# Patient Record
Sex: Female | Born: 1940 | Race: White | Hispanic: No | Smoking: Never smoker
Health system: Southern US, Community
[De-identification: ages and names within clinical notes are randomized; demographics above are authoritative.]

## PROBLEM LIST (undated history)

## (undated) DIAGNOSIS — I509 Heart failure, unspecified: Secondary | ICD-10-CM

## (undated) DIAGNOSIS — J45909 Unspecified asthma, uncomplicated: Secondary | ICD-10-CM

## (undated) DIAGNOSIS — E119 Type 2 diabetes mellitus without complications: Secondary | ICD-10-CM

## (undated) DIAGNOSIS — I10 Essential (primary) hypertension: Secondary | ICD-10-CM

## (undated) DIAGNOSIS — I2510 Atherosclerotic heart disease of native coronary artery without angina pectoris: Secondary | ICD-10-CM

## (undated) DIAGNOSIS — E785 Hyperlipidemia, unspecified: Secondary | ICD-10-CM

## (undated) DIAGNOSIS — F419 Anxiety disorder, unspecified: Secondary | ICD-10-CM

## (undated) DIAGNOSIS — I4891 Unspecified atrial fibrillation: Secondary | ICD-10-CM

## (undated) HISTORY — PX: NO PAST SURGERIES: SHX2092

## (undated) HISTORY — PX: EYE SURGERY: SHX253

## (undated) HISTORY — PX: CORONARY ARTERY BYPASS GRAFT: SHX141

---

## 2003-12-28 ENCOUNTER — Other Ambulatory Visit: Payer: Self-pay

## 2003-12-28 ENCOUNTER — Emergency Department: Payer: Self-pay | Admitting: Emergency Medicine

## 2004-02-01 ENCOUNTER — Other Ambulatory Visit: Payer: Self-pay

## 2004-02-01 ENCOUNTER — Emergency Department: Payer: Self-pay | Admitting: Emergency Medicine

## 2004-05-11 ENCOUNTER — Emergency Department: Payer: Self-pay | Admitting: Unknown Physician Specialty

## 2004-05-11 ENCOUNTER — Other Ambulatory Visit: Payer: Self-pay

## 2004-07-19 ENCOUNTER — Other Ambulatory Visit: Payer: Self-pay

## 2004-07-19 ENCOUNTER — Emergency Department: Payer: Self-pay | Admitting: Emergency Medicine

## 2004-08-11 ENCOUNTER — Other Ambulatory Visit: Payer: Self-pay

## 2004-08-11 ENCOUNTER — Inpatient Hospital Stay: Payer: Self-pay | Admitting: Internal Medicine

## 2004-08-12 ENCOUNTER — Other Ambulatory Visit: Payer: Self-pay

## 2004-08-13 ENCOUNTER — Other Ambulatory Visit: Payer: Self-pay

## 2005-01-20 ENCOUNTER — Emergency Department: Payer: Self-pay | Admitting: Emergency Medicine

## 2005-02-12 ENCOUNTER — Emergency Department: Payer: Self-pay | Admitting: Emergency Medicine

## 2005-02-23 ENCOUNTER — Emergency Department: Payer: Self-pay | Admitting: Emergency Medicine

## 2005-12-26 ENCOUNTER — Emergency Department: Payer: Self-pay | Admitting: Emergency Medicine

## 2006-01-04 ENCOUNTER — Ambulatory Visit: Payer: Self-pay | Admitting: Family Medicine

## 2006-01-20 ENCOUNTER — Other Ambulatory Visit: Payer: Self-pay

## 2006-01-20 ENCOUNTER — Emergency Department: Payer: Self-pay | Admitting: Emergency Medicine

## 2006-03-02 ENCOUNTER — Emergency Department: Payer: Self-pay | Admitting: Emergency Medicine

## 2006-03-02 ENCOUNTER — Other Ambulatory Visit: Payer: Self-pay

## 2006-03-16 ENCOUNTER — Emergency Department: Payer: Self-pay | Admitting: Emergency Medicine

## 2006-04-15 IMAGING — CT CT HEAD WITHOUT CONTRAST
2 series · 16 of 30 positions shown, 20 images · non-contrast
Comparison: none

REASON FOR EXAM: evaluate for bleeding rm12
COMMENTS:

[Series 2: without · axial · non-contrast · 0.38mm/px · z∈[+262,+382]mm · 13 of 29 slices shown, 17 images]
[im 3/29  brain]
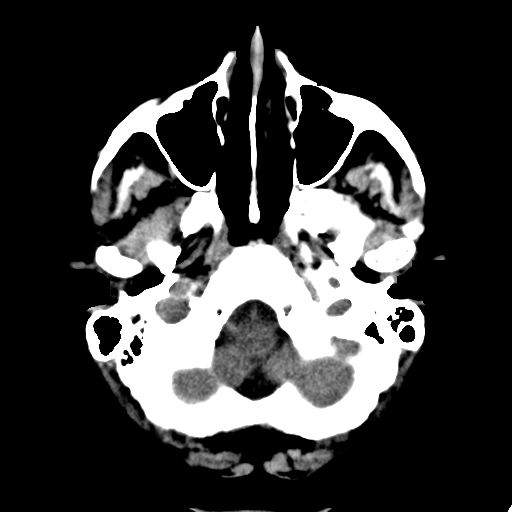
[im 3/29  bone]
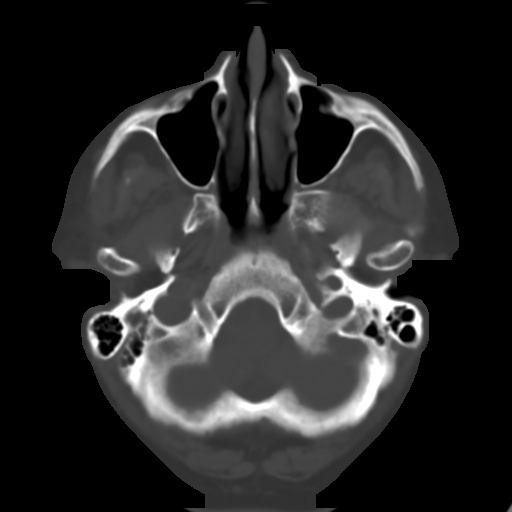
[im 5/29  brain]
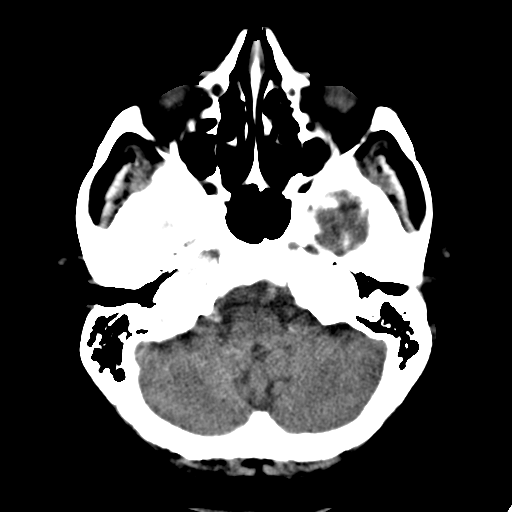
[im 7/29  brain]
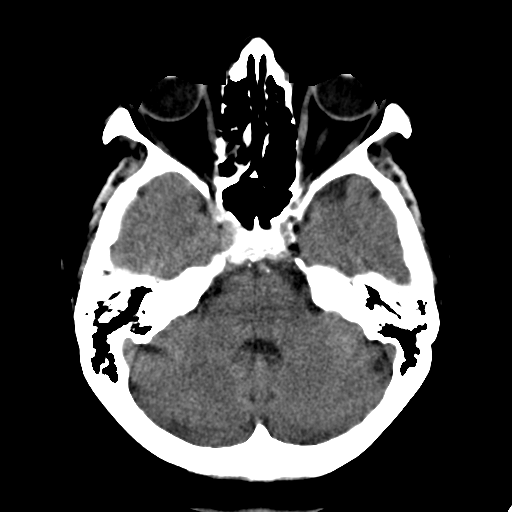
[im 9/29  brain]
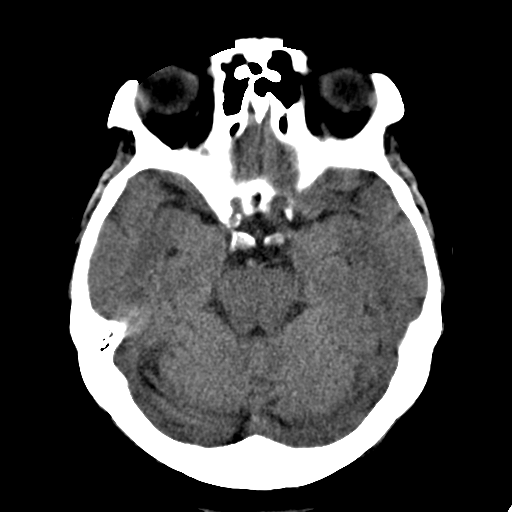
[im 11/29  brain]
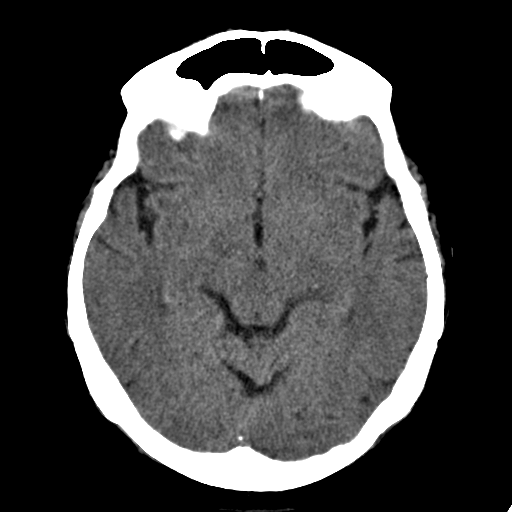
[im 11/29  bone]
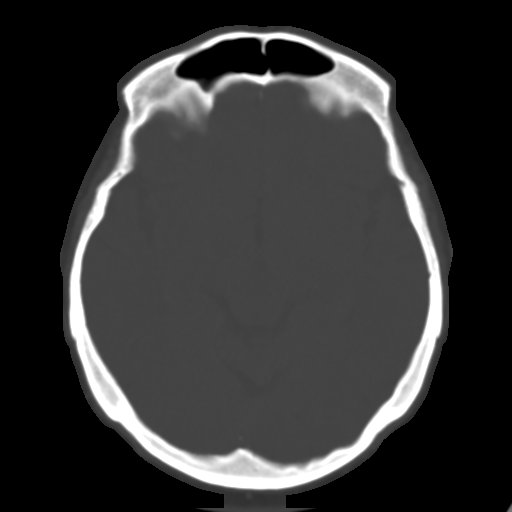
[im 13/29  brain]
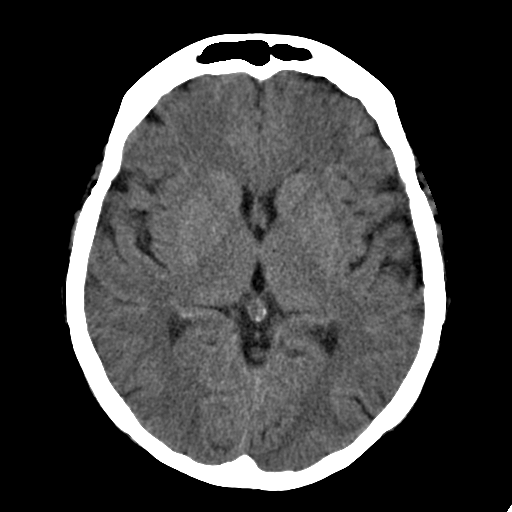
[im 15/29  brain]
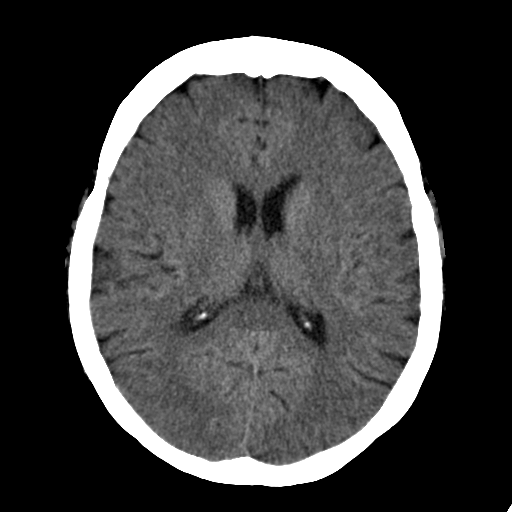
[im 17/29  brain]
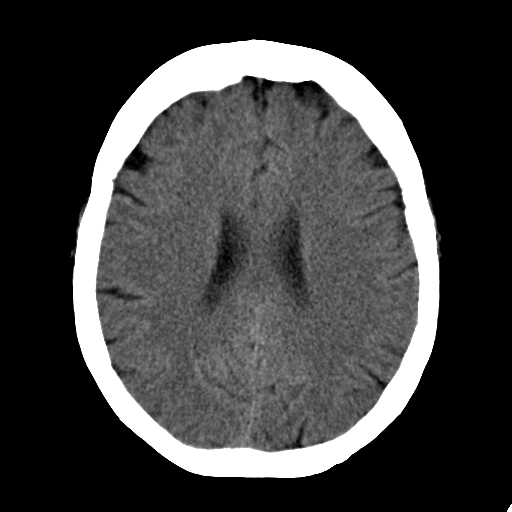
[im 19/29  brain]
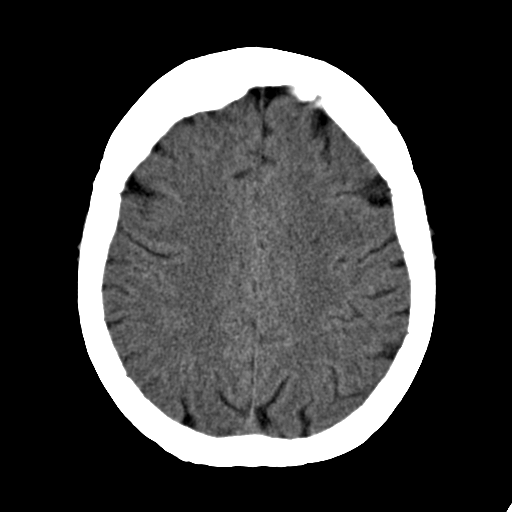
[im 19/29  bone]
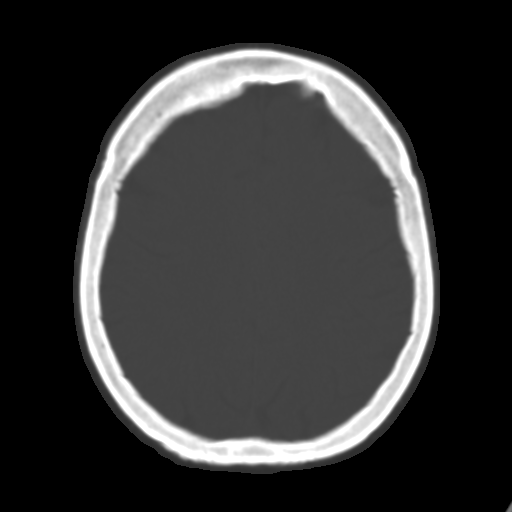
[im 21/29  brain]
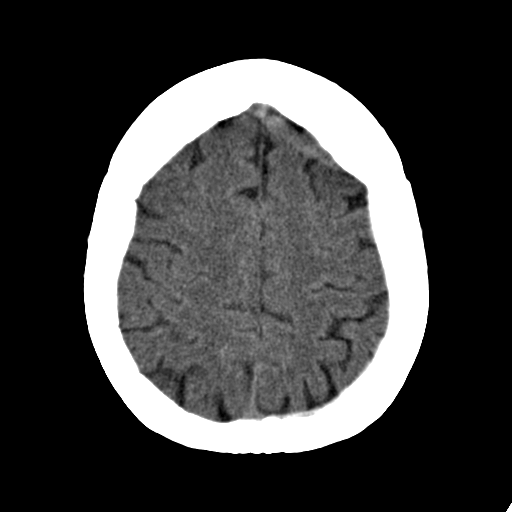
[im 23/29  brain]
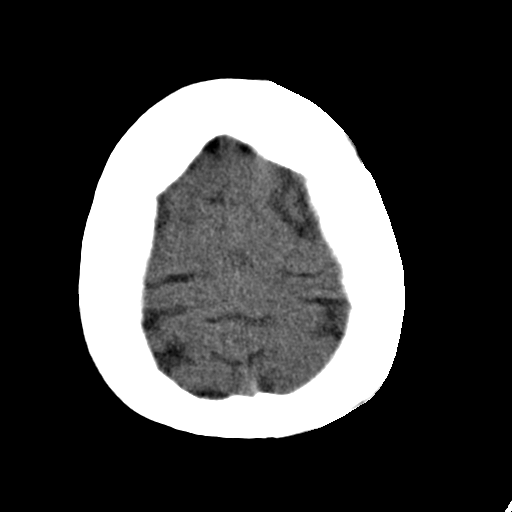
[im 25/29  brain]
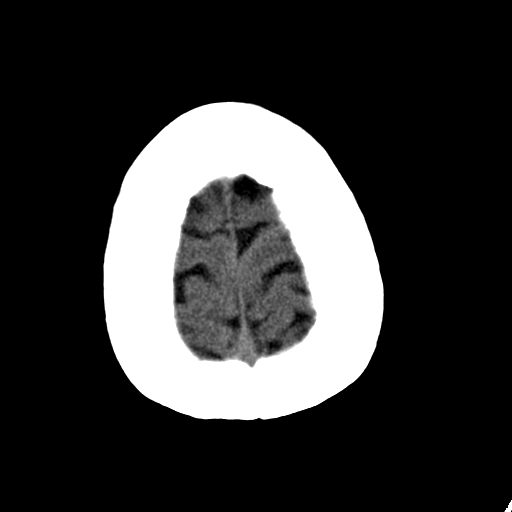
[im 27/29  brain]
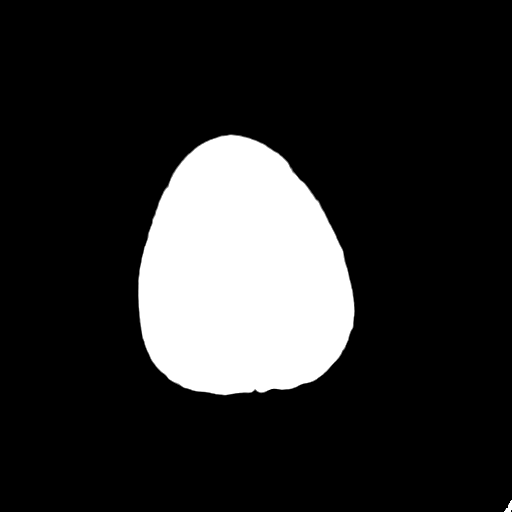
[im 27/29  bone]
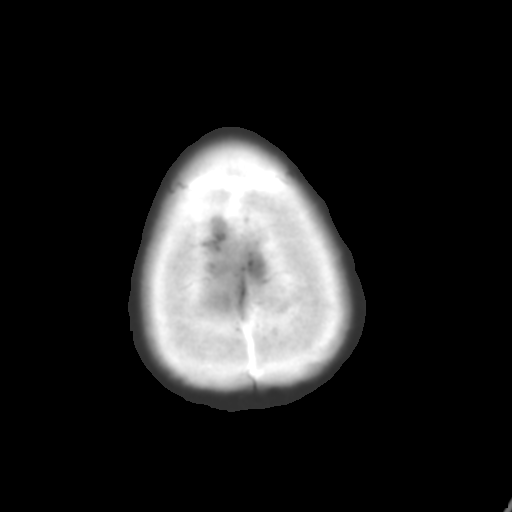

[Series 3: bone windows · axial · 0.38mm/px · z∈[+262,+302]mm · 3 of 29 slices shown]
[im 3/29  bone]
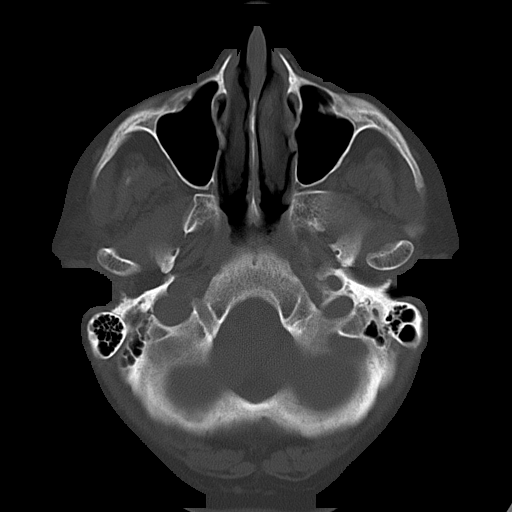
[im 7/29  bone]
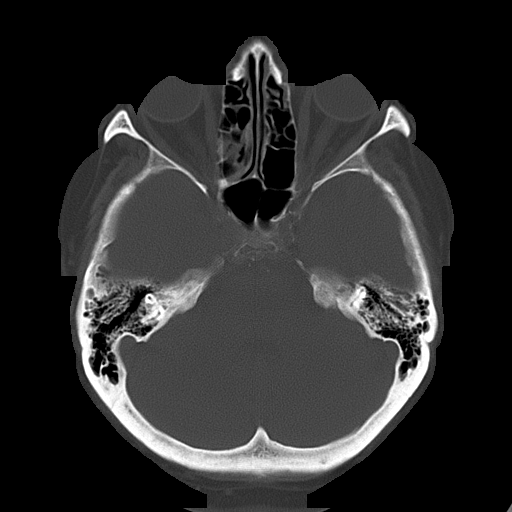
[im 11/29  bone]
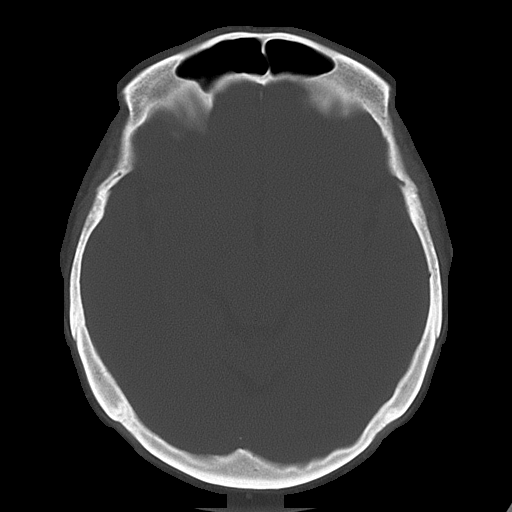

[16 of 30 positions shown; findings below may reference images not displayed]

PROCEDURE:     CT  - CT HEAD WITHOUT CONTRAST  - February 01, 2004  [DATE]

RESULT:       There no evidence of intra-axial or extra-axial fluid
collections nor evidence of acute hemorrhage.  No secondary signs are
appreciated to suggest mass effect, subacute or chronic infarction.
Age-related changes are evident demonstrated by basal ganglia
calcifications.
IMPRESSION: Mild age related changes without evidence of acute abnormalities or focal
abnormalities.

The findings were called to Dr. Kwokfai in the Emergency Room at the
conclusion of the study.

## 2006-06-08 ENCOUNTER — Inpatient Hospital Stay: Payer: Self-pay | Admitting: Internal Medicine

## 2006-06-08 ENCOUNTER — Other Ambulatory Visit: Payer: Self-pay

## 2006-06-09 ENCOUNTER — Other Ambulatory Visit: Payer: Self-pay

## 2006-07-02 ENCOUNTER — Emergency Department: Payer: Self-pay | Admitting: Emergency Medicine

## 2006-07-02 ENCOUNTER — Other Ambulatory Visit: Payer: Self-pay

## 2006-08-07 ENCOUNTER — Emergency Department: Payer: Self-pay | Admitting: General Practice

## 2006-08-08 ENCOUNTER — Emergency Department: Payer: Self-pay | Admitting: Emergency Medicine

## 2006-08-22 ENCOUNTER — Emergency Department: Payer: Self-pay | Admitting: Emergency Medicine

## 2006-08-22 ENCOUNTER — Other Ambulatory Visit: Payer: Self-pay

## 2006-08-31 ENCOUNTER — Emergency Department: Payer: Self-pay

## 2006-08-31 ENCOUNTER — Other Ambulatory Visit: Payer: Self-pay

## 2006-09-12 ENCOUNTER — Emergency Department: Payer: Self-pay | Admitting: Unknown Physician Specialty

## 2006-09-12 ENCOUNTER — Other Ambulatory Visit: Payer: Self-pay

## 2006-10-01 IMAGING — CR DG CHEST 2V
1 series · 2 of 2 positions shown · non-contrast
Comparison: none

REASON FOR EXAM: Shortness of breath
COMMENTS:

[Series 1: view not recorded · 0.17mm/px · 2 of 2 slices shown]
[im 1/2]
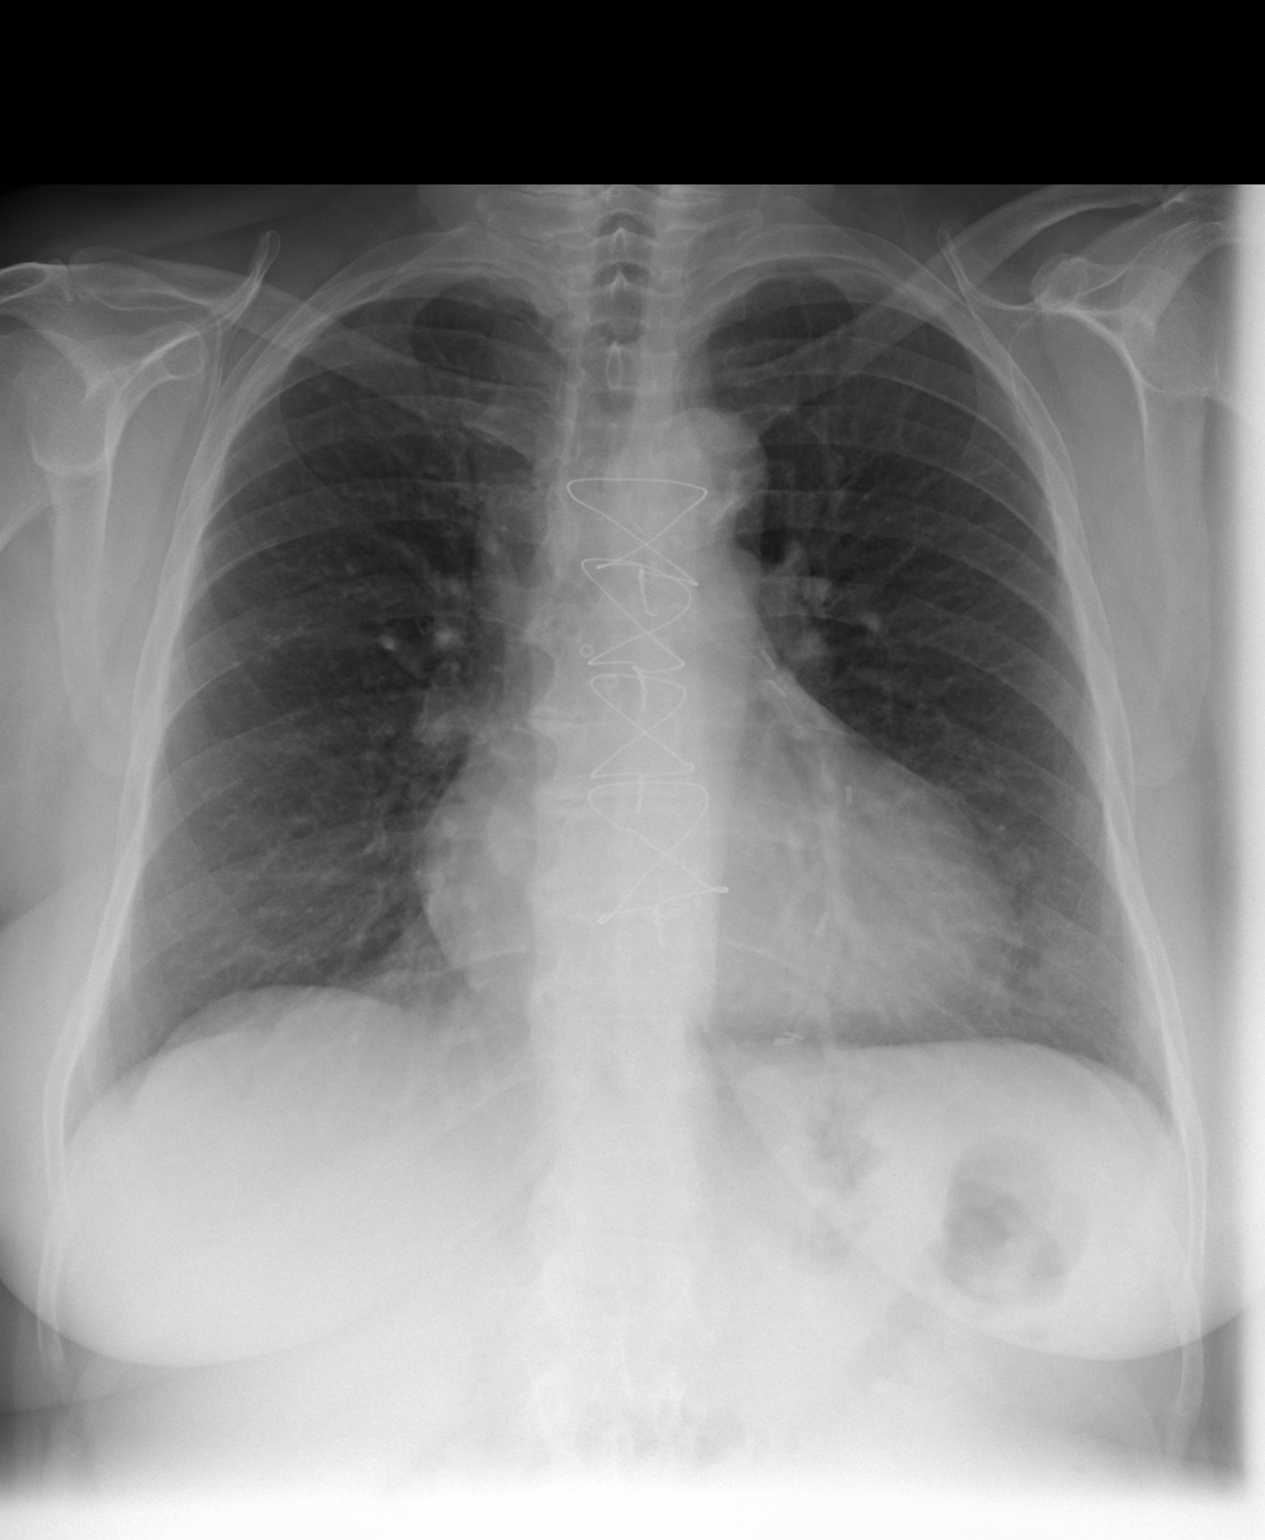
[im 2/2]
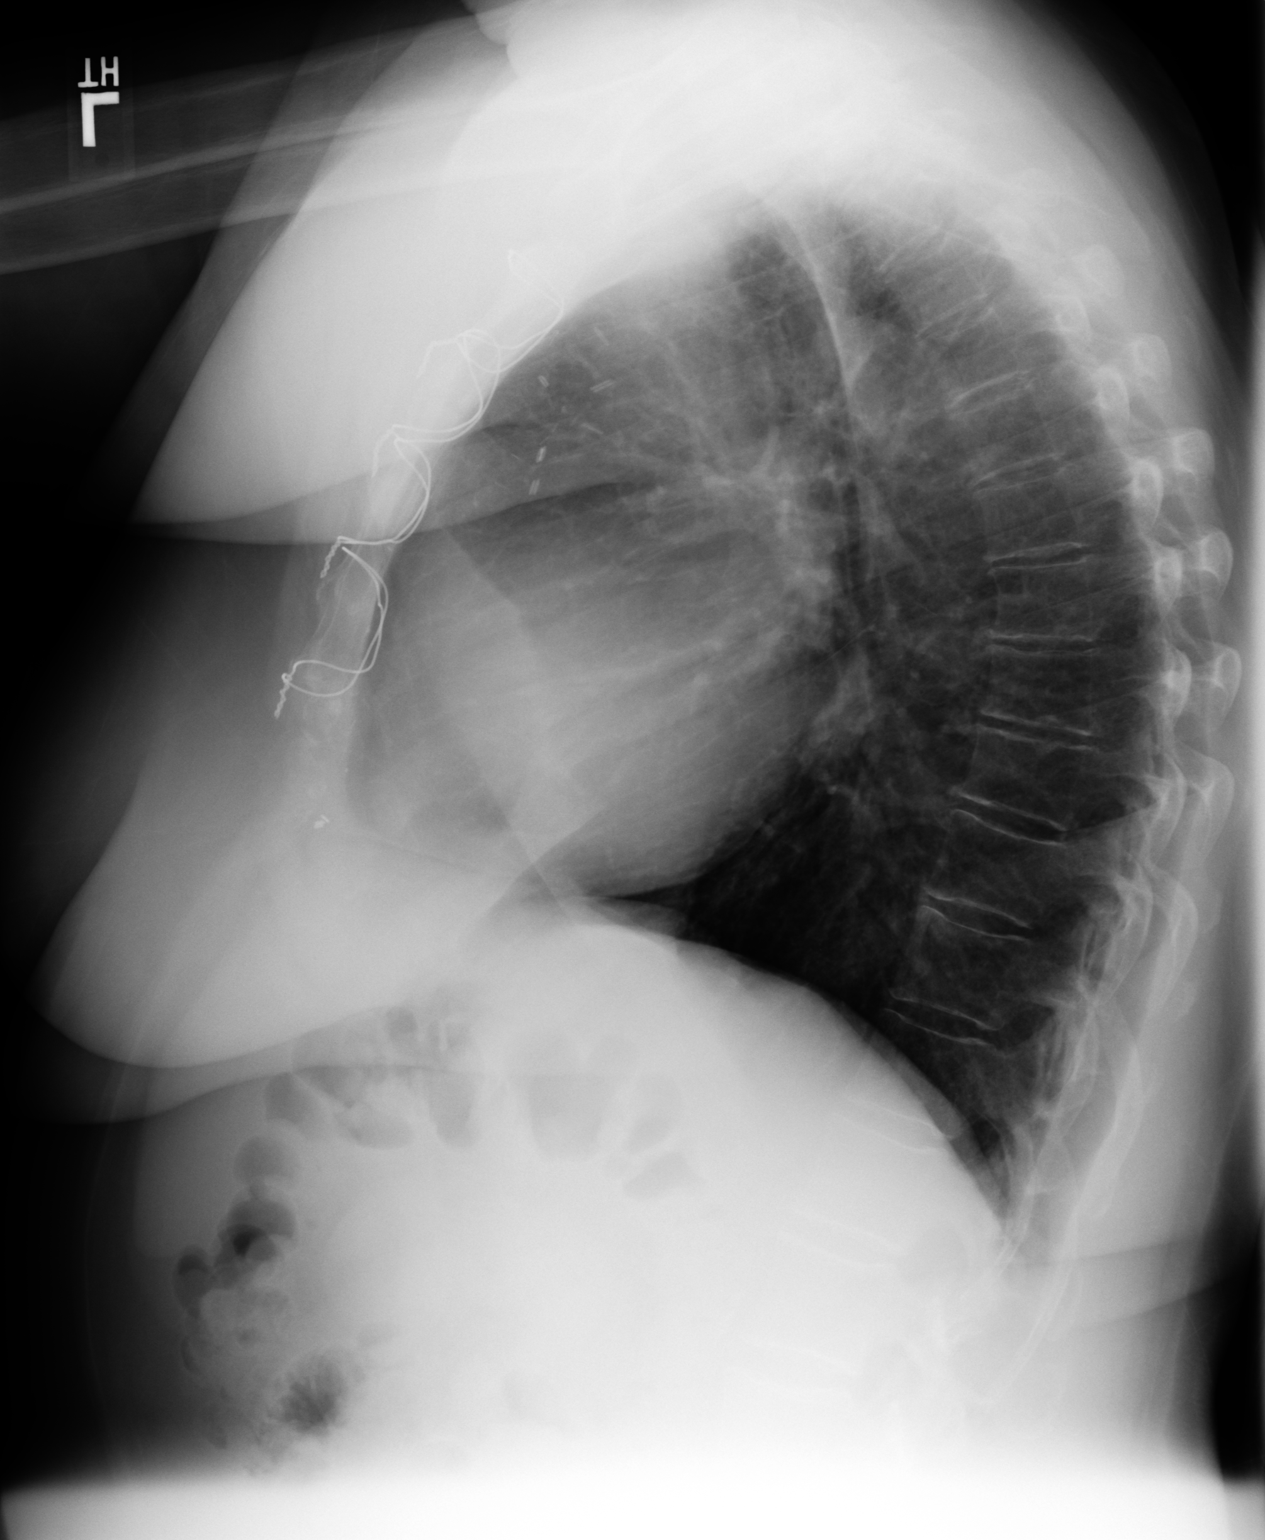

[2 of 2 positions shown; findings below may reference images not displayed]

PROCEDURE:     DXR - DXR CHEST PA (OR AP) AND LATERAL  - July 19, 2004  [DATE]

RESULT:     The current exam is compared to a prior exam of 05/11/2004.

The lung fields are clear. No pneumonia, pneumothorax or pleural effusion is
seen. No interstitial or pulmonary edema is noted. The heart is mildly
enlarged but stable as compared to the previous examination. Post-operative
changes of prior CABG are noted. No acute bony abnormalities are seen.
IMPRESSION: No acute changes are identified.

## 2006-10-05 ENCOUNTER — Emergency Department: Payer: Self-pay | Admitting: Emergency Medicine

## 2006-10-05 ENCOUNTER — Other Ambulatory Visit: Payer: Self-pay

## 2006-10-17 ENCOUNTER — Emergency Department: Payer: Self-pay | Admitting: Internal Medicine

## 2006-10-17 ENCOUNTER — Other Ambulatory Visit: Payer: Self-pay

## 2006-10-24 IMAGING — CR DG CHEST 1V PORT
1 series · 1 of 1 positions shown · non-contrast
Comparison: none

REASON FOR EXAM: Chest pain
COMMENTS:  LMP: Post-Menopausal

[view not recorded]
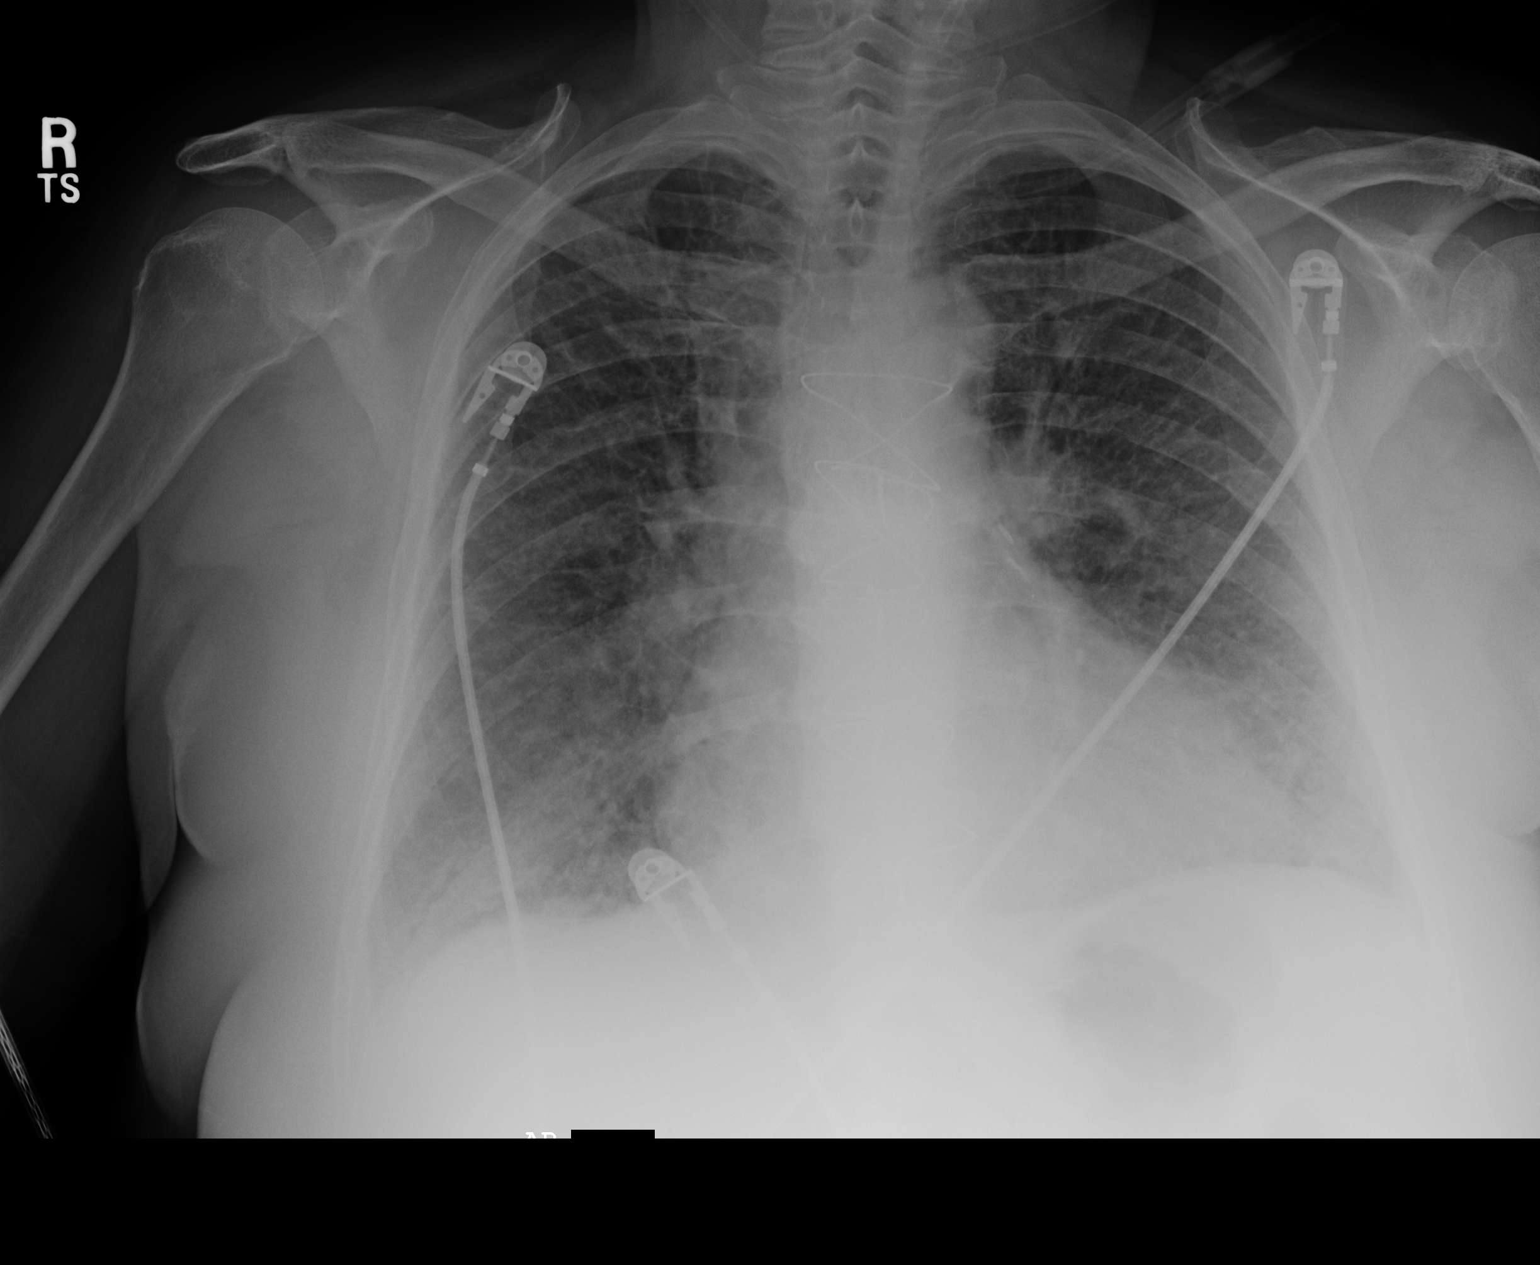

[1 of 1 positions shown; findings below may reference images not displayed]

PROCEDURE:     DXR - DXR PORTABLE CHEST SINGLE VIEW  - August 11, 2004 [DATE]

RESULT:     Comparison is made to a prior PA and lateral exam performed on
07/19/2004.

Cardiac monitoring electrodes are present. The cardiac silhouette is mildly
enlarged. There is engorgement of the pulmonary vasculature especially
centrally. There is slight prominence of the interstitial markings. CABG
changes are present.
IMPRESSION: Pulmonary vascular congestion with cardiomegaly. Mild congestive failure
cannot be excluded.

## 2006-10-26 IMAGING — CT CT HEAD WITHOUT CONTRAST
1 series · 16 of 26 positions shown, 20 images · non-contrast
Comparison: none

REASON FOR EXAM: EKG changes consistant with intracranial bleed
COMMENTS:

PROCEDURE:     CT  - CT HEAD WITHOUT CONTRAST  - August 13, 2004 [DATE]
RESULT:     Noncontrast emergent CT scan of brain is performed.
Comparison is made to a prior study from 02/01/2004.

[Series 2: without · axial · non-contrast · 0.41mm/px · z∈[-143,-28]mm · 16 of 26 slices shown, 20 images]
[im 2/26  brain]
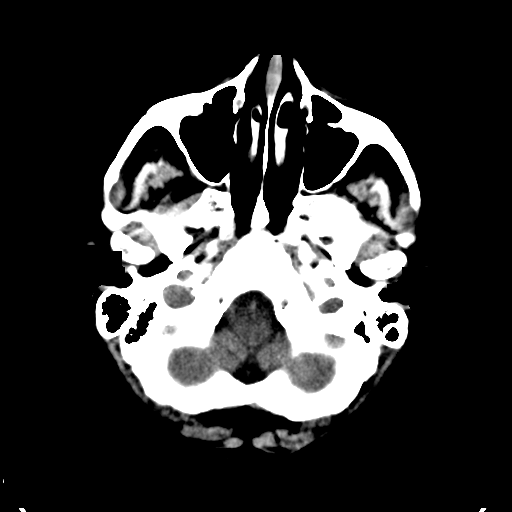
[im 2/26  bone]
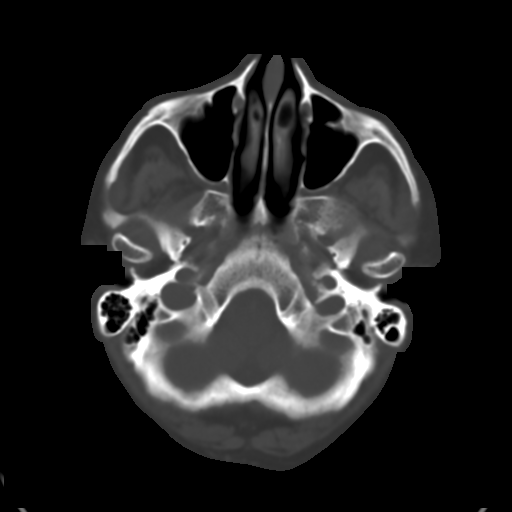
[im 4/26  brain]
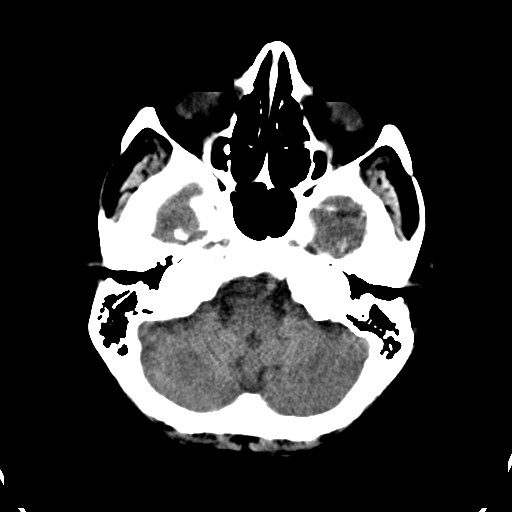
[im 5/26  brain]
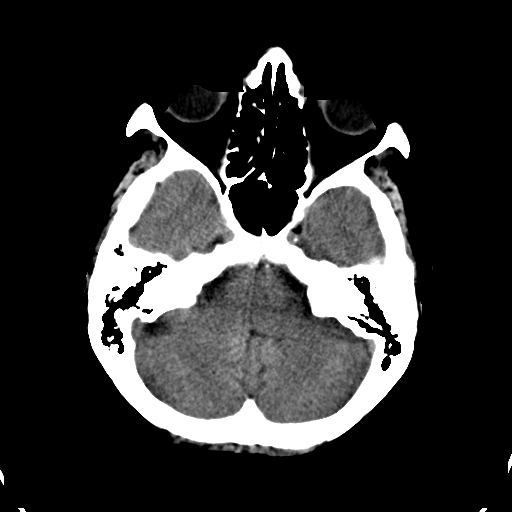
[im 7/26  brain]
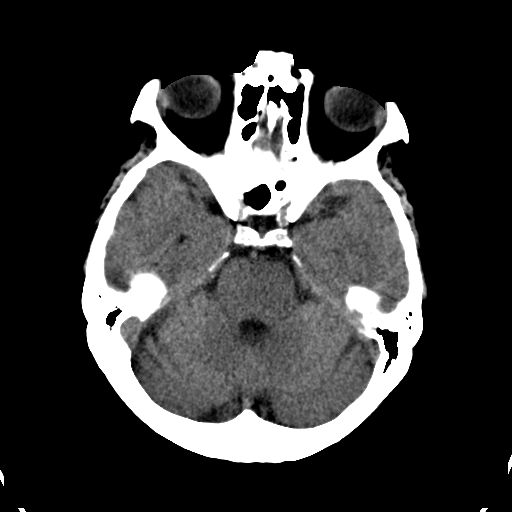
[im 8/26  brain]
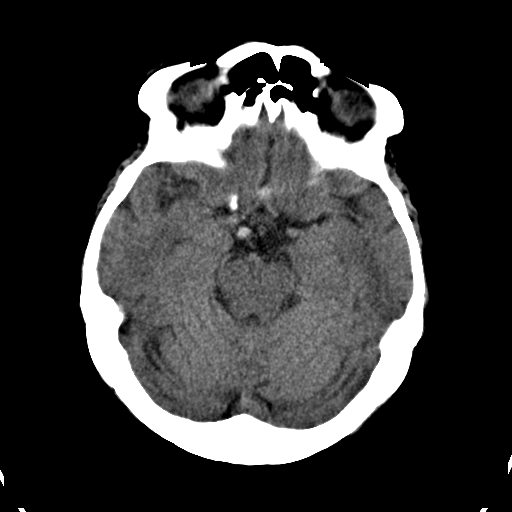
[im 8/26  bone]
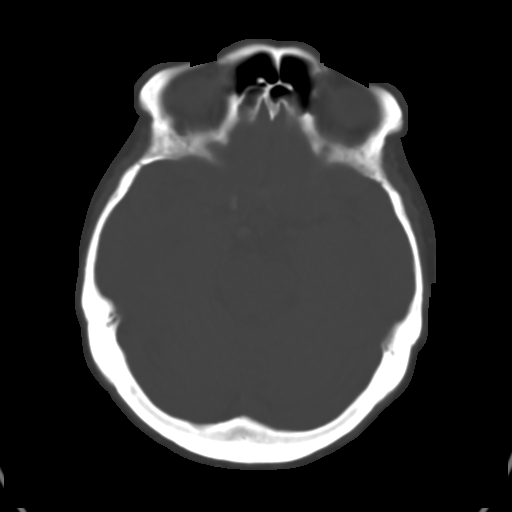
[im 10/26  brain]
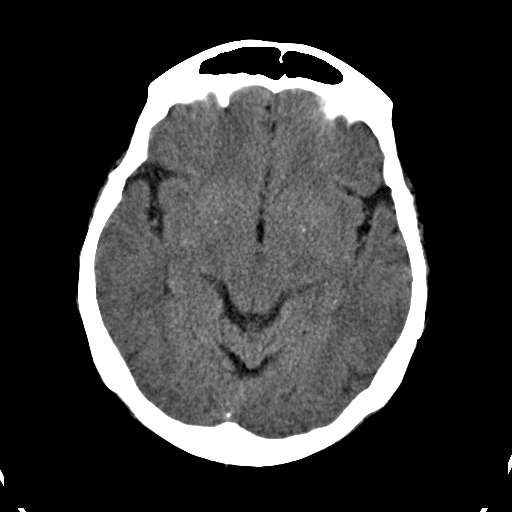
[im 11/26  brain]
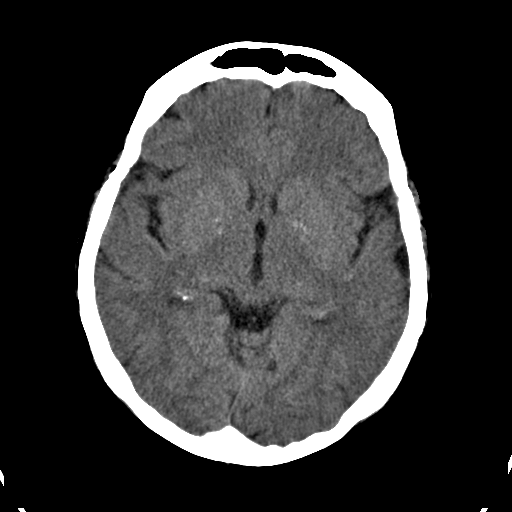
[im 13/26  brain]
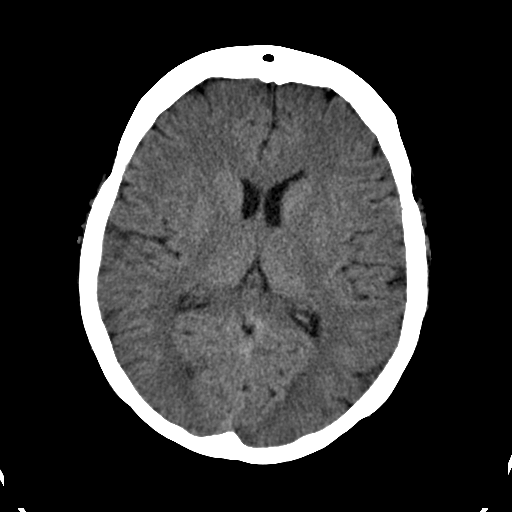
[im 14/26  brain]
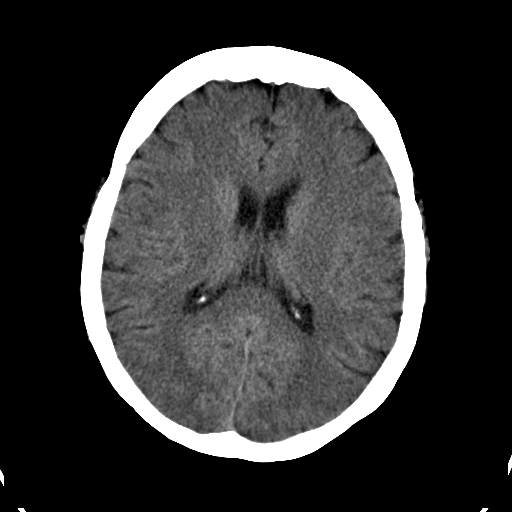
[im 14/26  bone]
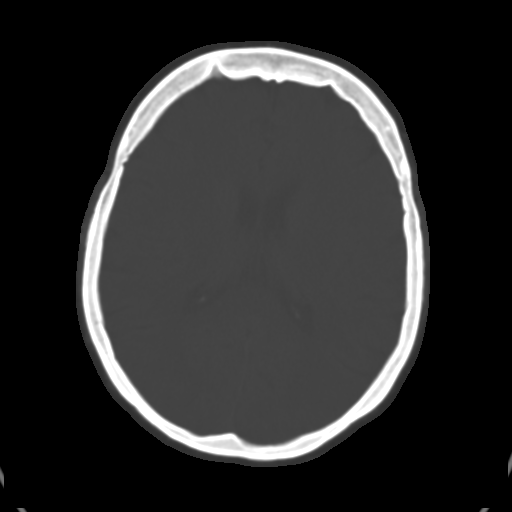
[im 16/26  brain]
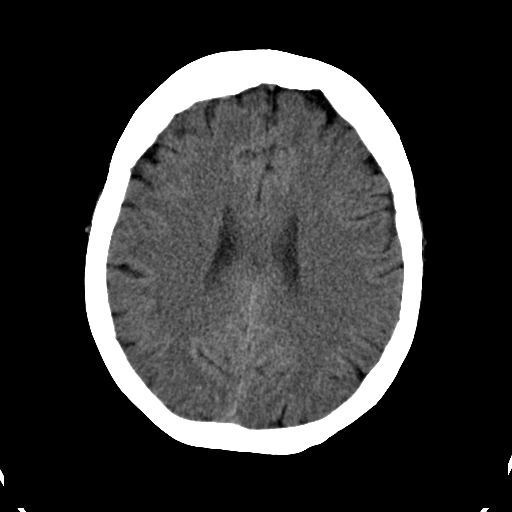
[im 17/26  brain]
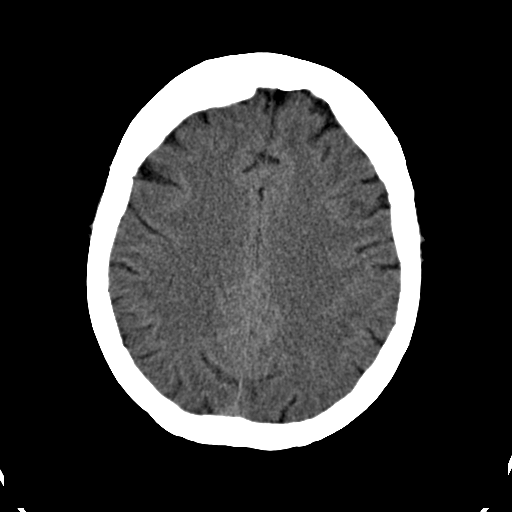
[im 19/26  brain]
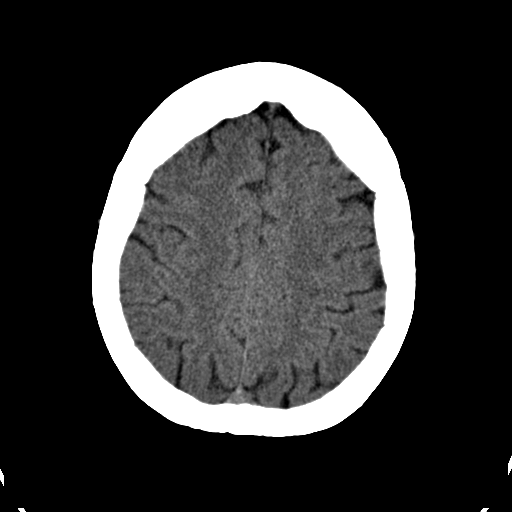
[im 20/26  brain]
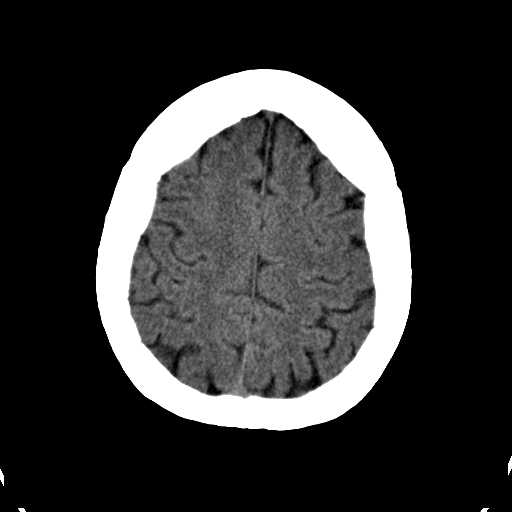
[im 20/26  bone]
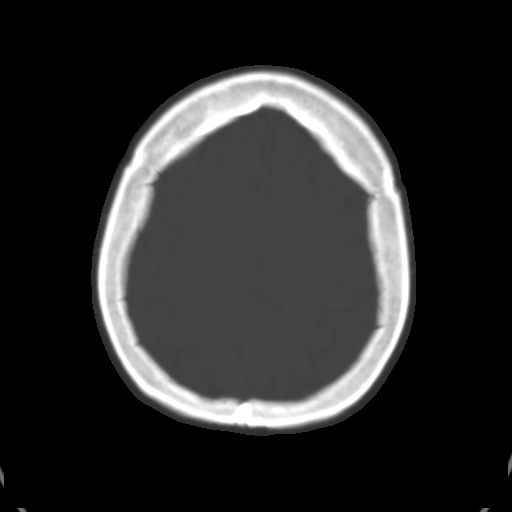
[im 22/26  brain]
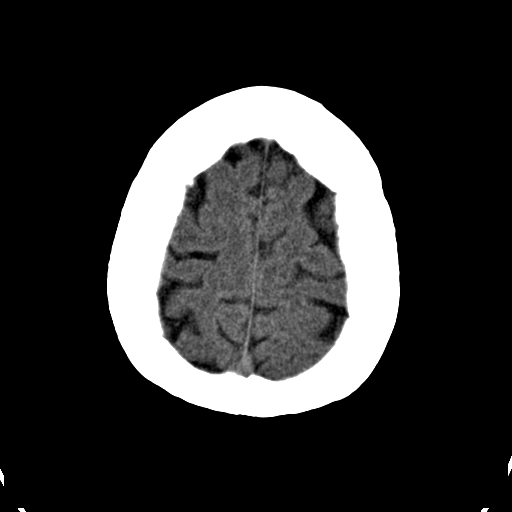
[im 23/26  brain]
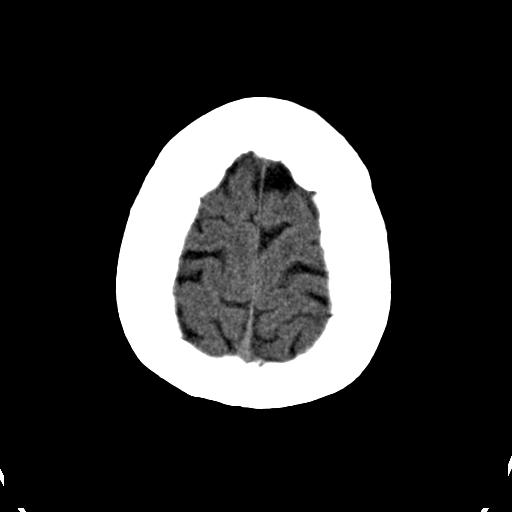
[im 25/26  brain]
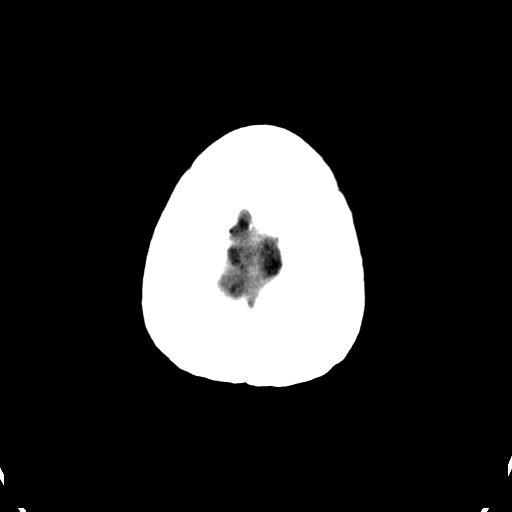

[16 of 26 positions shown; findings below may reference images not displayed]

FINDINGS: Today's examination shows the ventricles and sulci appear to be
within normal limits. There is no evidence of hemorrhage. There is no mass
effect or midline shift. There is no extra-axial hematoma. The visualized
paranasal sinuses are normally aerated.
IMPRESSION: No CT evidence of an acute intracranial abnormality. The
appearance is unchanged when compared to the prior study.

## 2006-12-14 ENCOUNTER — Inpatient Hospital Stay: Payer: Self-pay | Admitting: Internal Medicine

## 2006-12-14 ENCOUNTER — Other Ambulatory Visit: Payer: Self-pay

## 2007-01-27 ENCOUNTER — Emergency Department: Payer: Self-pay | Admitting: Emergency Medicine

## 2007-01-27 ENCOUNTER — Other Ambulatory Visit: Payer: Self-pay

## 2007-03-30 ENCOUNTER — Observation Stay: Payer: Self-pay | Admitting: Internal Medicine

## 2007-03-30 ENCOUNTER — Other Ambulatory Visit: Payer: Self-pay

## 2007-04-24 ENCOUNTER — Other Ambulatory Visit: Payer: Self-pay

## 2007-04-24 ENCOUNTER — Emergency Department: Payer: Self-pay | Admitting: Emergency Medicine

## 2007-07-14 ENCOUNTER — Emergency Department: Payer: Self-pay | Admitting: Internal Medicine

## 2007-07-14 ENCOUNTER — Other Ambulatory Visit: Payer: Self-pay

## 2007-11-19 ENCOUNTER — Other Ambulatory Visit: Payer: Self-pay

## 2007-11-19 ENCOUNTER — Emergency Department: Payer: Self-pay | Admitting: Emergency Medicine

## 2007-11-22 ENCOUNTER — Emergency Department: Payer: Self-pay | Admitting: Emergency Medicine

## 2007-11-22 ENCOUNTER — Other Ambulatory Visit: Payer: Self-pay

## 2007-12-12 ENCOUNTER — Other Ambulatory Visit: Payer: Self-pay

## 2007-12-12 ENCOUNTER — Emergency Department: Payer: Self-pay | Admitting: Emergency Medicine

## 2008-02-28 ENCOUNTER — Emergency Department: Payer: Self-pay | Admitting: Internal Medicine

## 2008-03-18 IMAGING — US ABDOMEN ULTRASOUND
1 series · 17 of 25 positions shown · non-contrast
Comparison: none

REASON FOR EXAM: RUQ Eval for Gallstones
COMMENTS:

[Series 1: abdomen ultrasound · 17 of 43 slices shown]
[im 1/43]
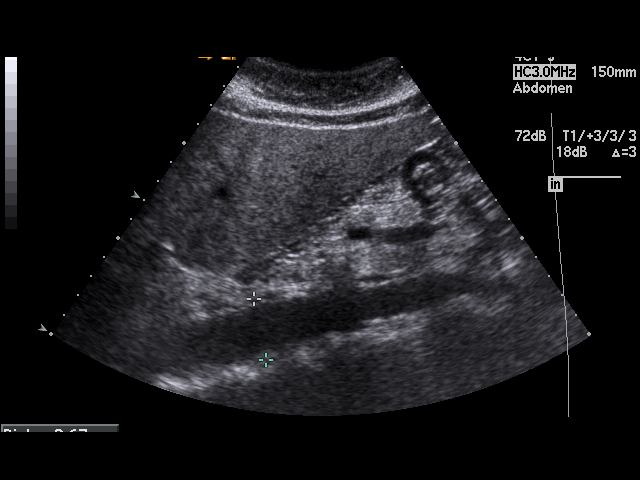
[im 4/43]
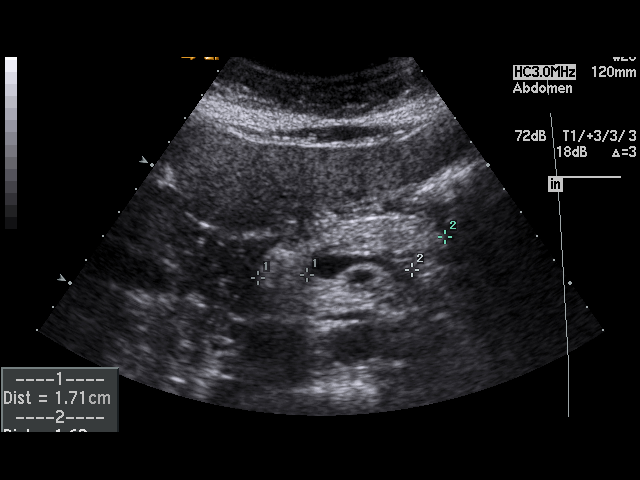
[im 6/43]
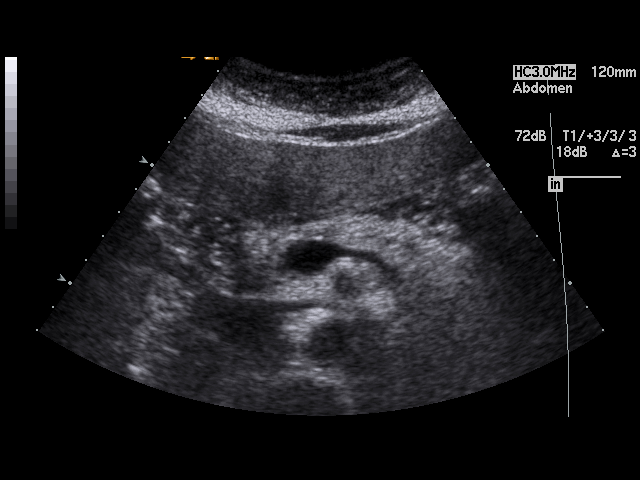
[im 9/43]
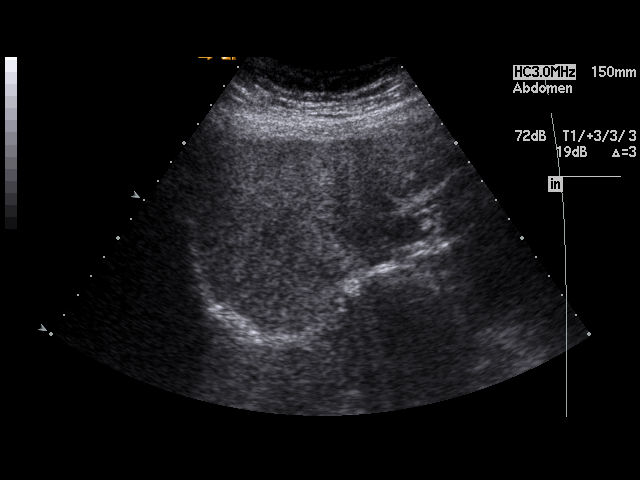
[im 11/43]
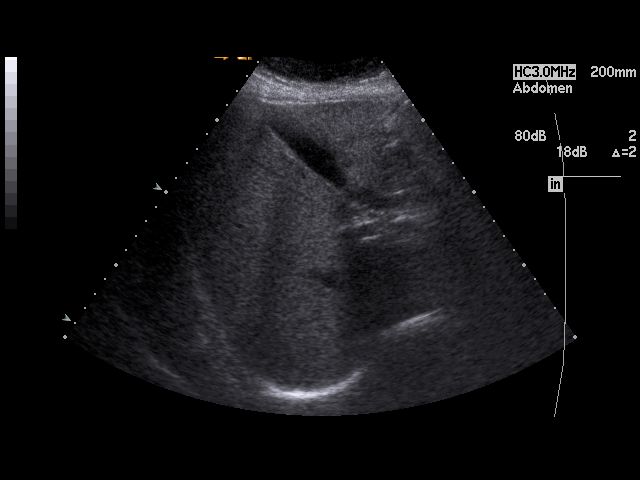
[im 15/43]
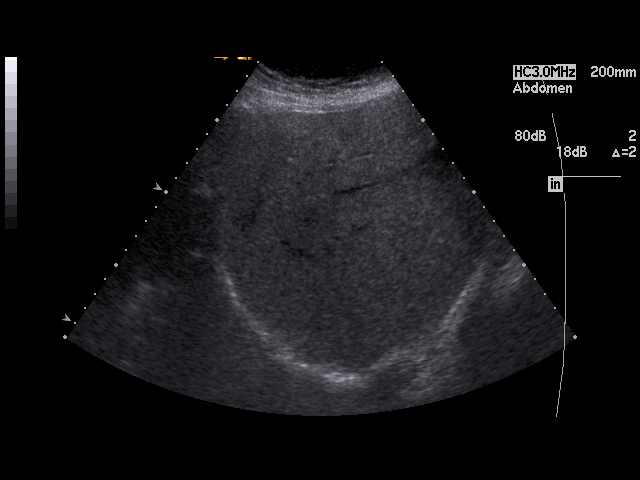
[im 16/43]
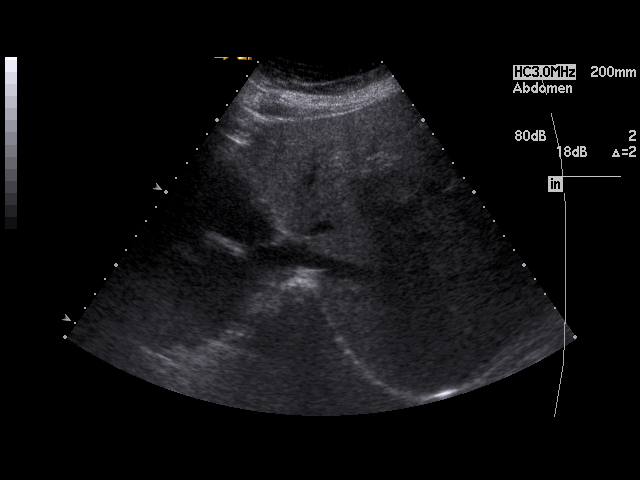
[im 20/43]
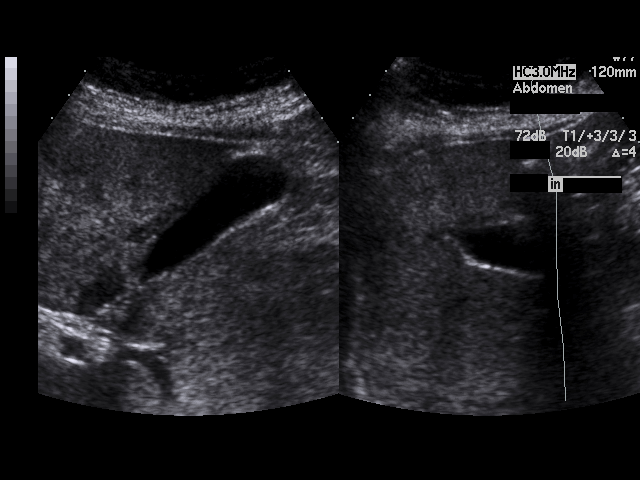
[im 22/43]
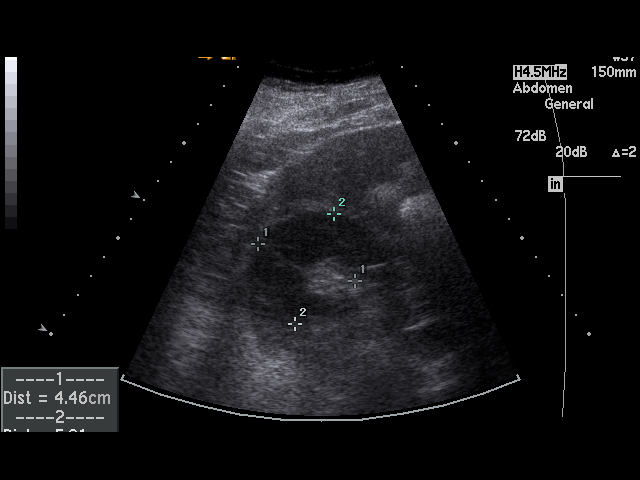
[im 23/43]
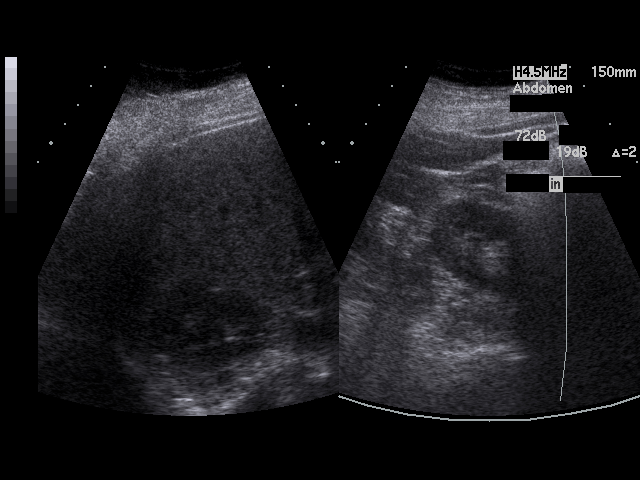
[im 27/43]
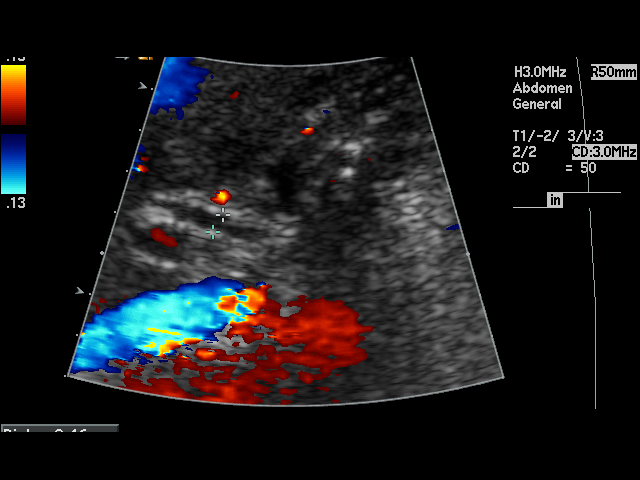
[im 29/43]
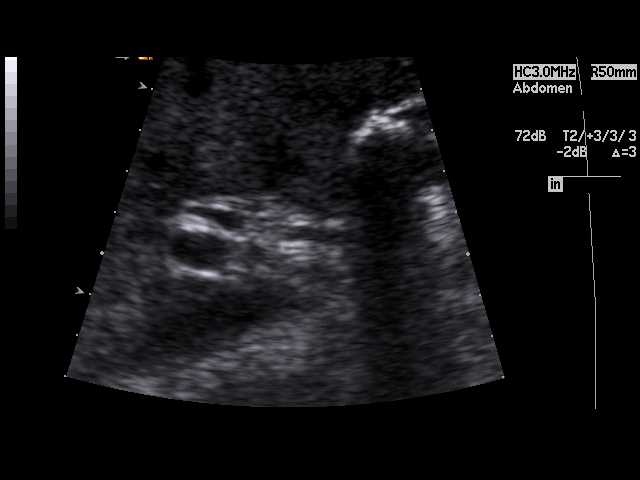
[im 32/43]
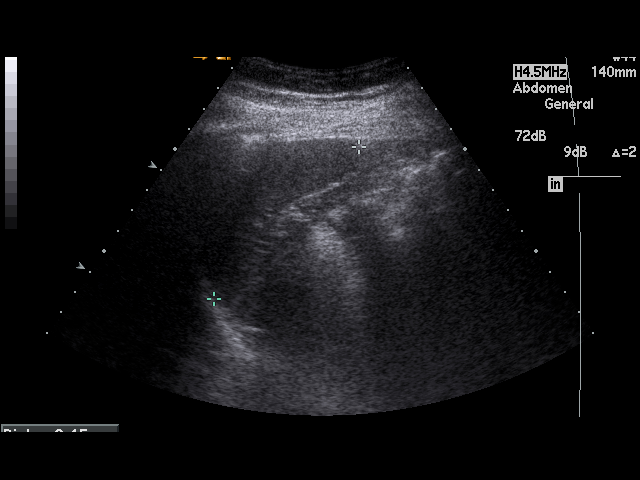
[im 34/43]
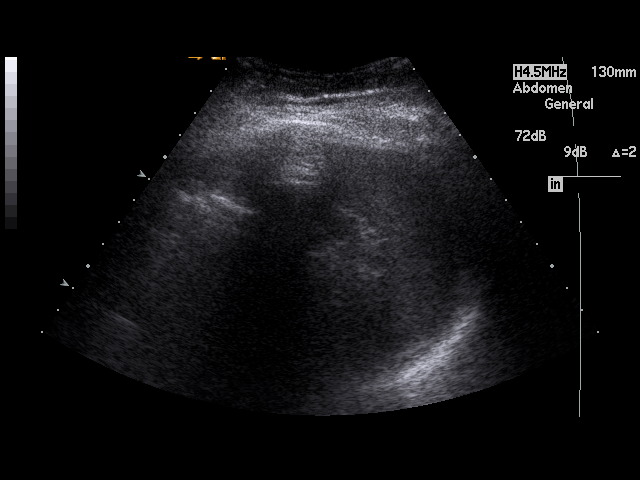
[im 37/43]
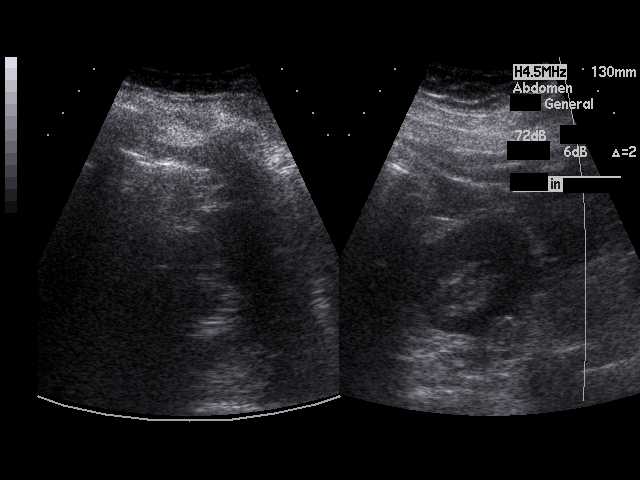
[im 39/43]
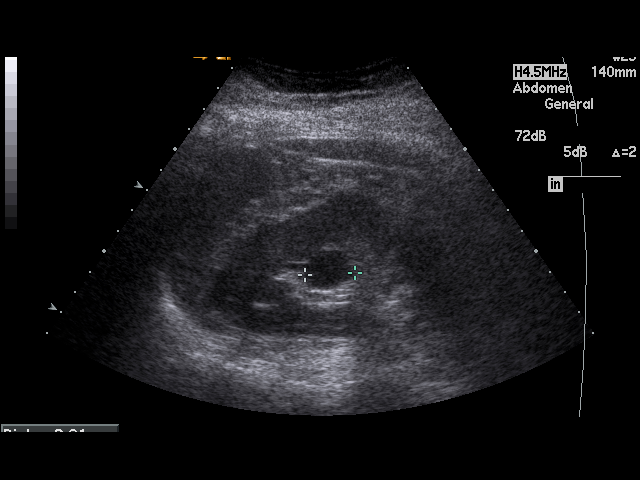
[im 43/43]
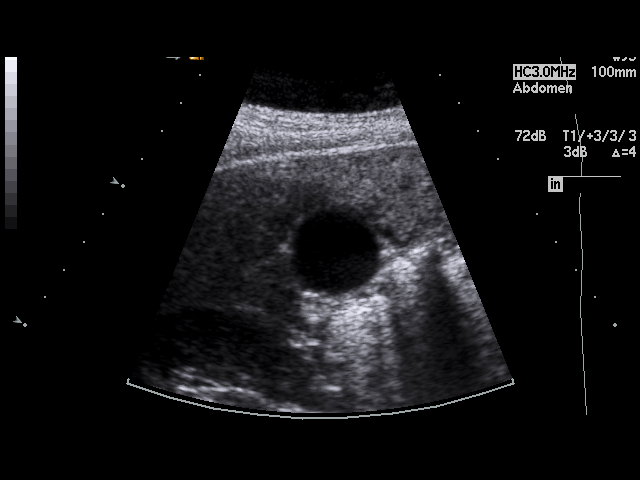

[17 of 25 positions shown; findings below may reference images not displayed]

PROCEDURE:     US  - US ABDOMEN GENERAL SURVEY  - January 04, 2006  [DATE]

RESULT:        The liver, spleen and pancreas show no specific
abnormalities.  No gallstones are seen. There is no thickening of the
gallbladder wall.  The common bile duct measures 4.9 mm in diameter which is
within normal limits.   The kidneys show no hydronephrosis.  There is a
cm cyst in the midpole region of the LEFT kidney.  No ascites is seen.
IMPRESSION: No significant abnormalities are identified.

## 2008-03-21 ENCOUNTER — Emergency Department: Payer: Self-pay | Admitting: Emergency Medicine

## 2008-04-03 IMAGING — CR DG CHEST 1V PORT
1 series · 1 of 1 positions shown · non-contrast
Comparison: none

REASON FOR EXAM: chestpain
COMMENTS:

[view not recorded]
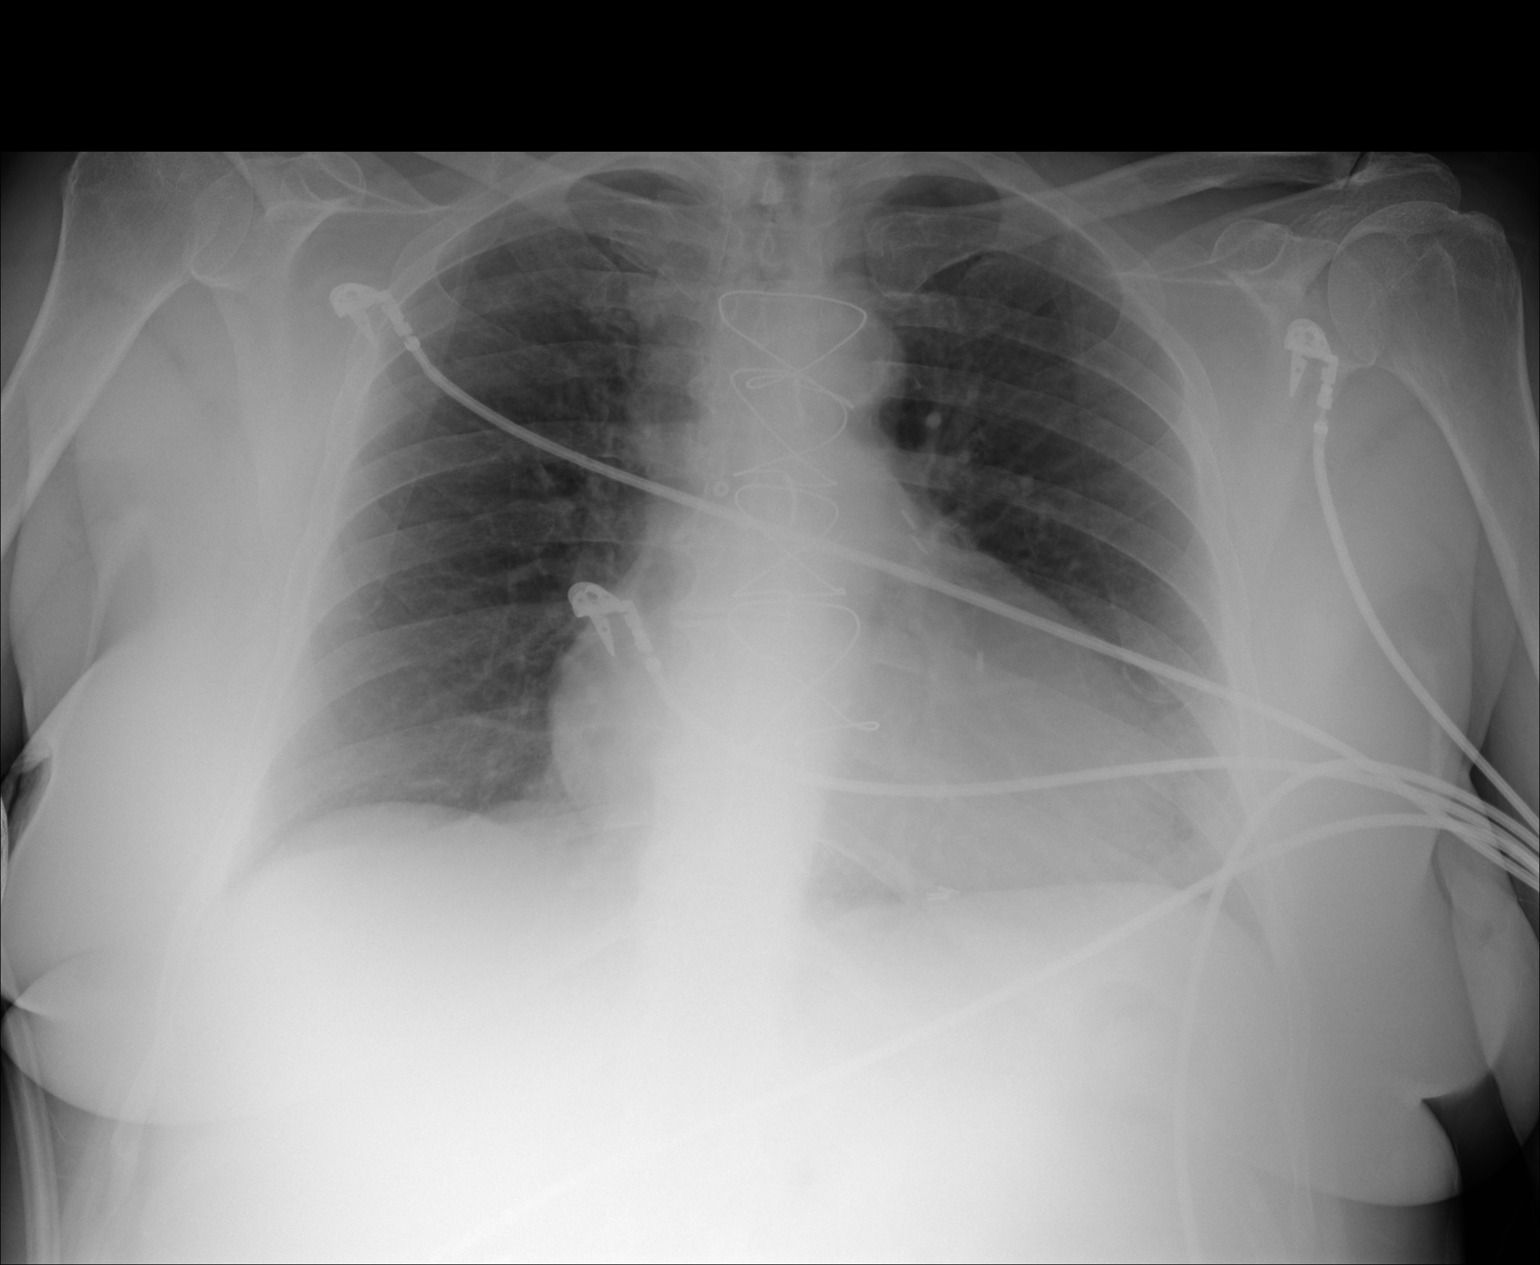

[1 of 1 positions shown; findings below may reference images not displayed]

PROCEDURE:     DXR - DXR PORTABLE CHEST SINGLE VIEW  - January 20, 2006  [DATE]

RESULT:     The heart is enlarged. Sternotomy wires are present. The
pulmonary vasculature and interstitial markings are less pronounced than on
the study of 08-11-04.  There is no effusion. There is no failure. Cardiac
monitoring electrodes are present.
IMPRESSION: 1)No acute cardiopulmonary disease.

2)Stable cardiomegaly.

3)Sternotomy wires and cardiac monitoring electrodes are in place.

## 2008-04-26 ENCOUNTER — Emergency Department: Payer: Self-pay | Admitting: Unknown Physician Specialty

## 2008-05-02 ENCOUNTER — Emergency Department: Payer: Self-pay | Admitting: Emergency Medicine

## 2008-05-28 IMAGING — CR DG CHEST 2V
1 series · 2 of 2 positions shown · non-contrast
Comparison: none

REASON FOR EXAM: Chest pain, cough
COMMENTS:

[Series 1: view not recorded · 0.17mm/px · 2 of 2 slices shown]
[im 1/2]
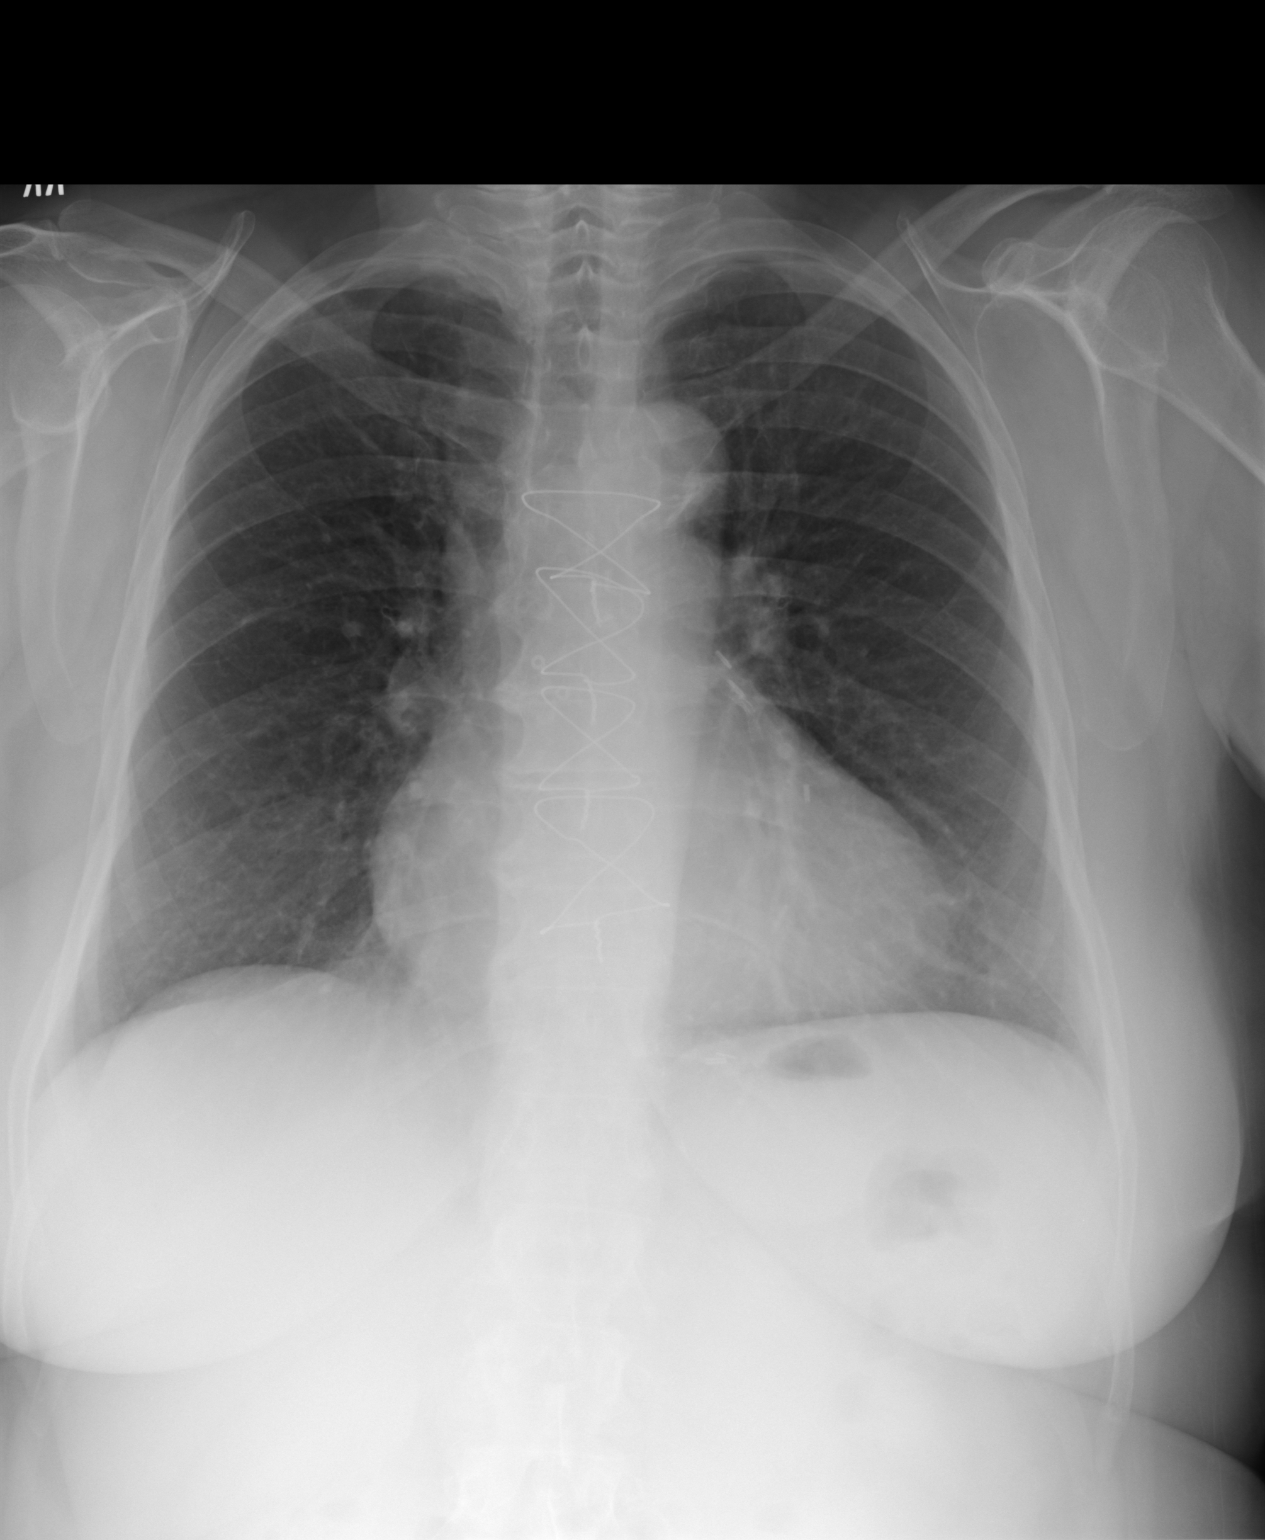
[im 2/2]
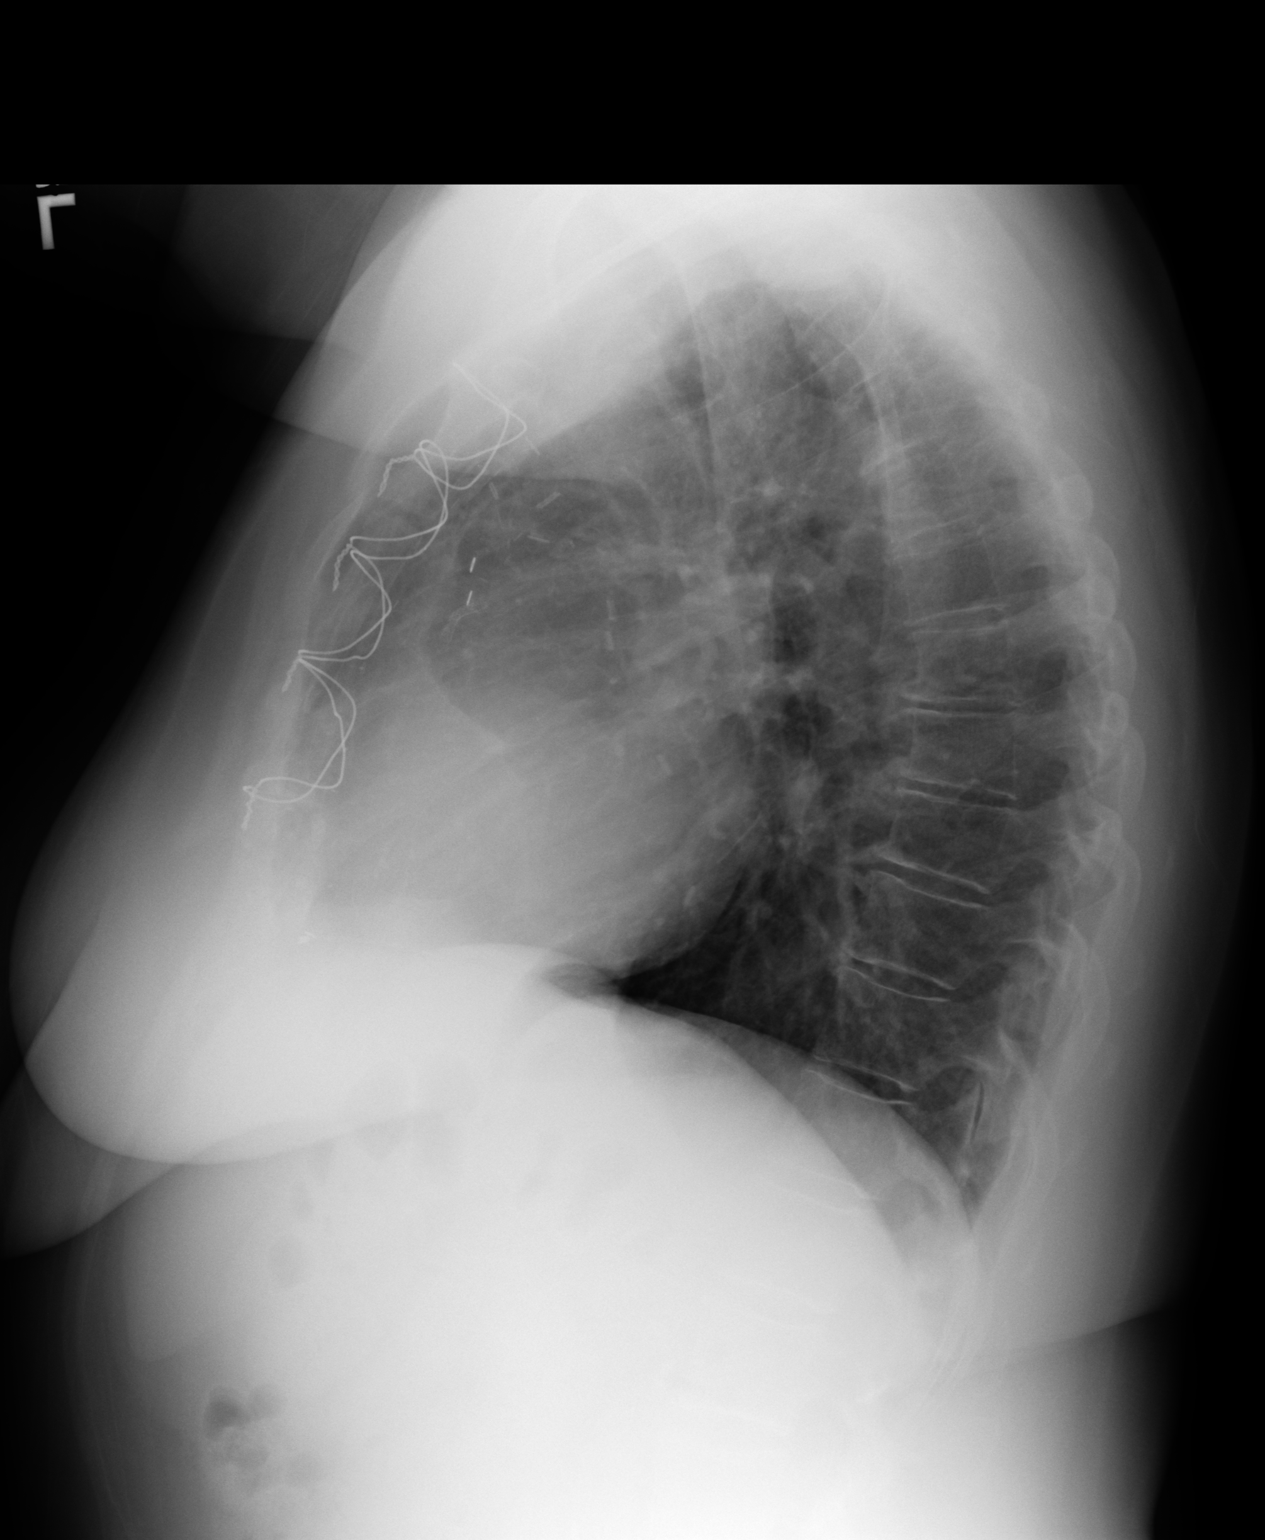

[2 of 2 positions shown; findings below may reference images not displayed]

PROCEDURE:     DXR - DXR CHEST PA (OR AP) AND LATERAL  - March 16, 2006  [DATE]

RESULT:     Comparison is made to a prior study dated 01/20/2006.

The patient is status post median sternotomy. The cardiac silhouette is
mildly enlarged. There is no evidence of focal infiltrates, effusions or
edema. The visualized bony skeleton is unremarkable.
IMPRESSION: No evidence of acute cardiopulmonary disease.

## 2008-06-30 ENCOUNTER — Ambulatory Visit: Payer: Self-pay | Admitting: Family Medicine

## 2008-08-20 IMAGING — CR DG CHEST 1V PORT
1 series · 1 of 1 positions shown · non-contrast
Comparison: none

REASON FOR EXAM: Chest pain
COMMENTS:

[view not recorded]
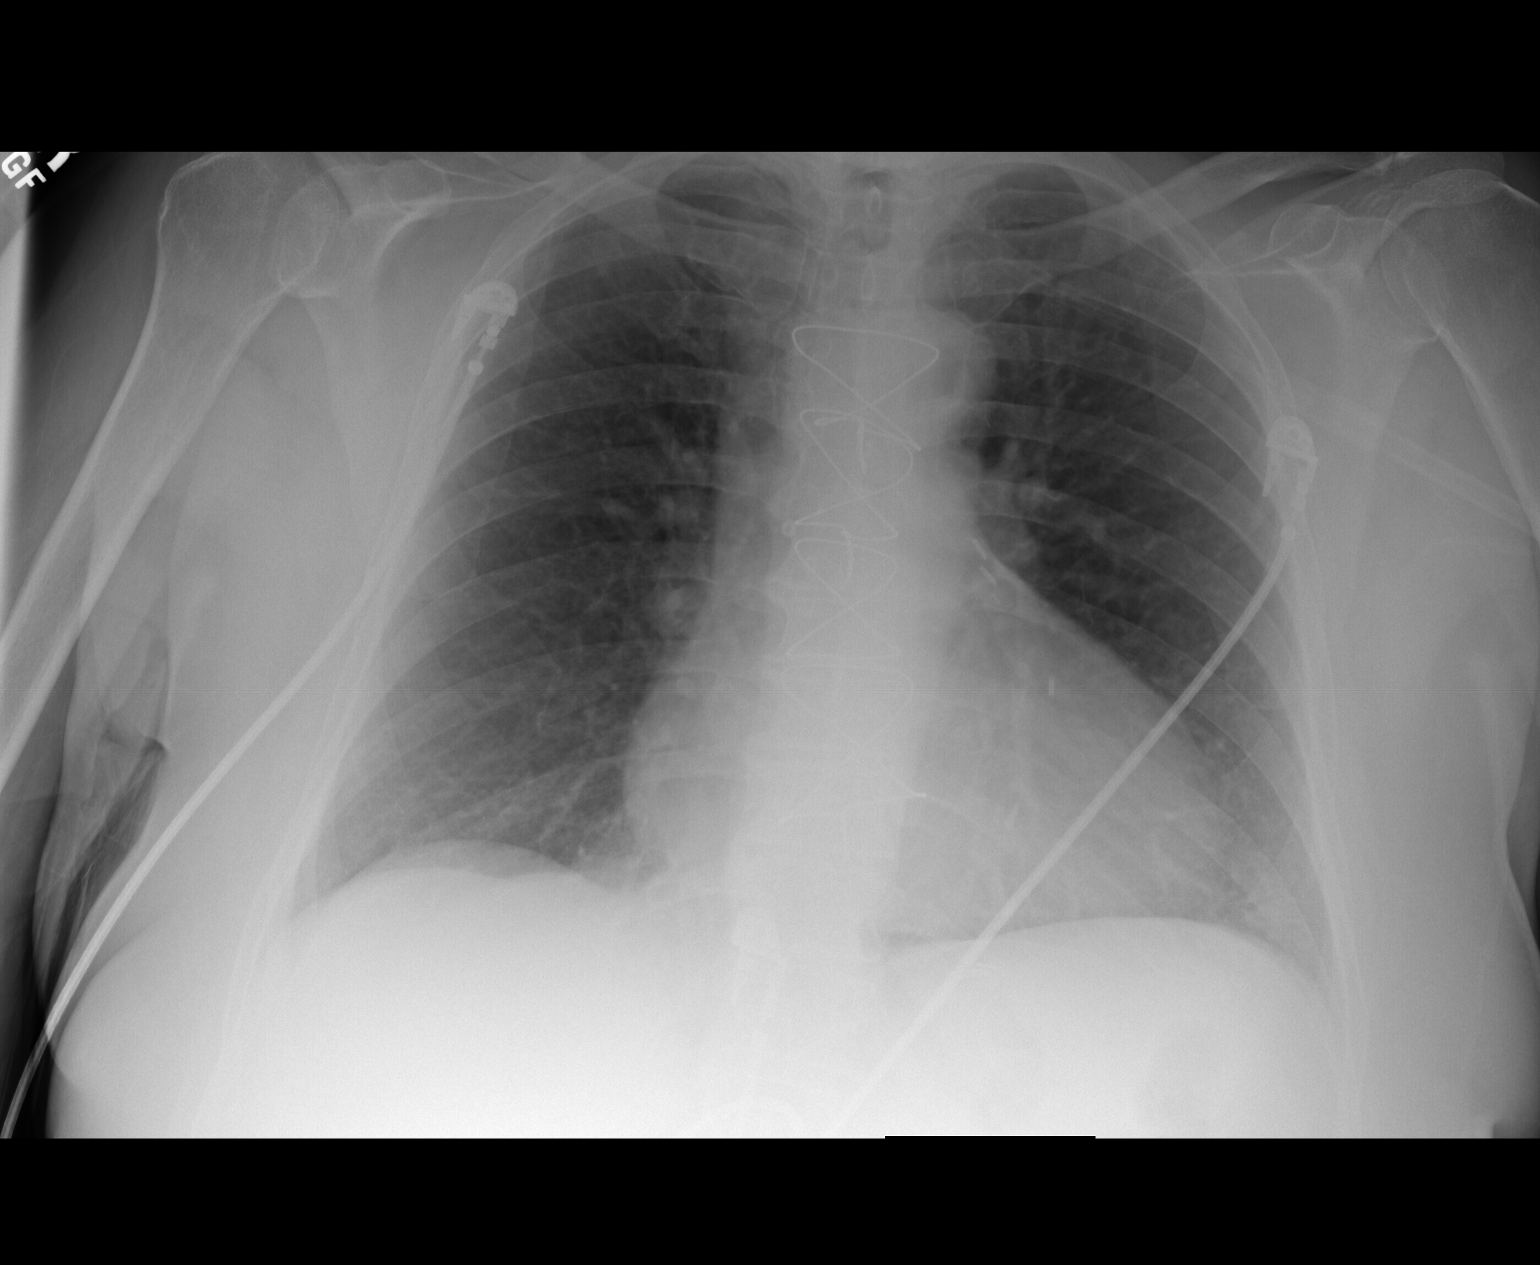

[1 of 1 positions shown; findings below may reference images not displayed]

PROCEDURE:     DXR - DXR PORTABLE CHEST SINGLE VIEW  - June 08, 2006  [DATE]

RESULT:     Frontal view of the chest is performed.

Comparison is made to a prior study dated 01/20/2006.

There is an element of flattening of the hemidiaphragms. Mild thickening of
the interstitial markings is appreciated. The cardiac silhouette is enlarged
indicative of cardiomegaly. The visualized bony skeleton is unremarkable.
The patient is status post median sternotomy and coronary artery bypass
grafting.
IMPRESSION: Findings consistent with an element of COPD as well as
possibly an element of pulmonary fibrosis without evidence of acute
cardiopulmonary disease.

## 2008-09-11 ENCOUNTER — Emergency Department: Payer: Self-pay | Admitting: Emergency Medicine

## 2008-09-13 IMAGING — CR DG CHEST 1V PORT
1 series · 1 of 1 positions shown · non-contrast
Comparison: none

REASON FOR EXAM: Shortness of Breath
COMMENTS:

PROCEDURE:     DXR - DXR PORTABLE CHEST SINGLE VIEW  - July 02, 2006  [DATE]
RESULT:     Comparison is made to the study of 06/08/2006. Cardiac monitoring
electrodes are present. Sternotomy wires are present. The heart is
borderline to mildly enlarged. The lungs are clear.

[view not recorded]
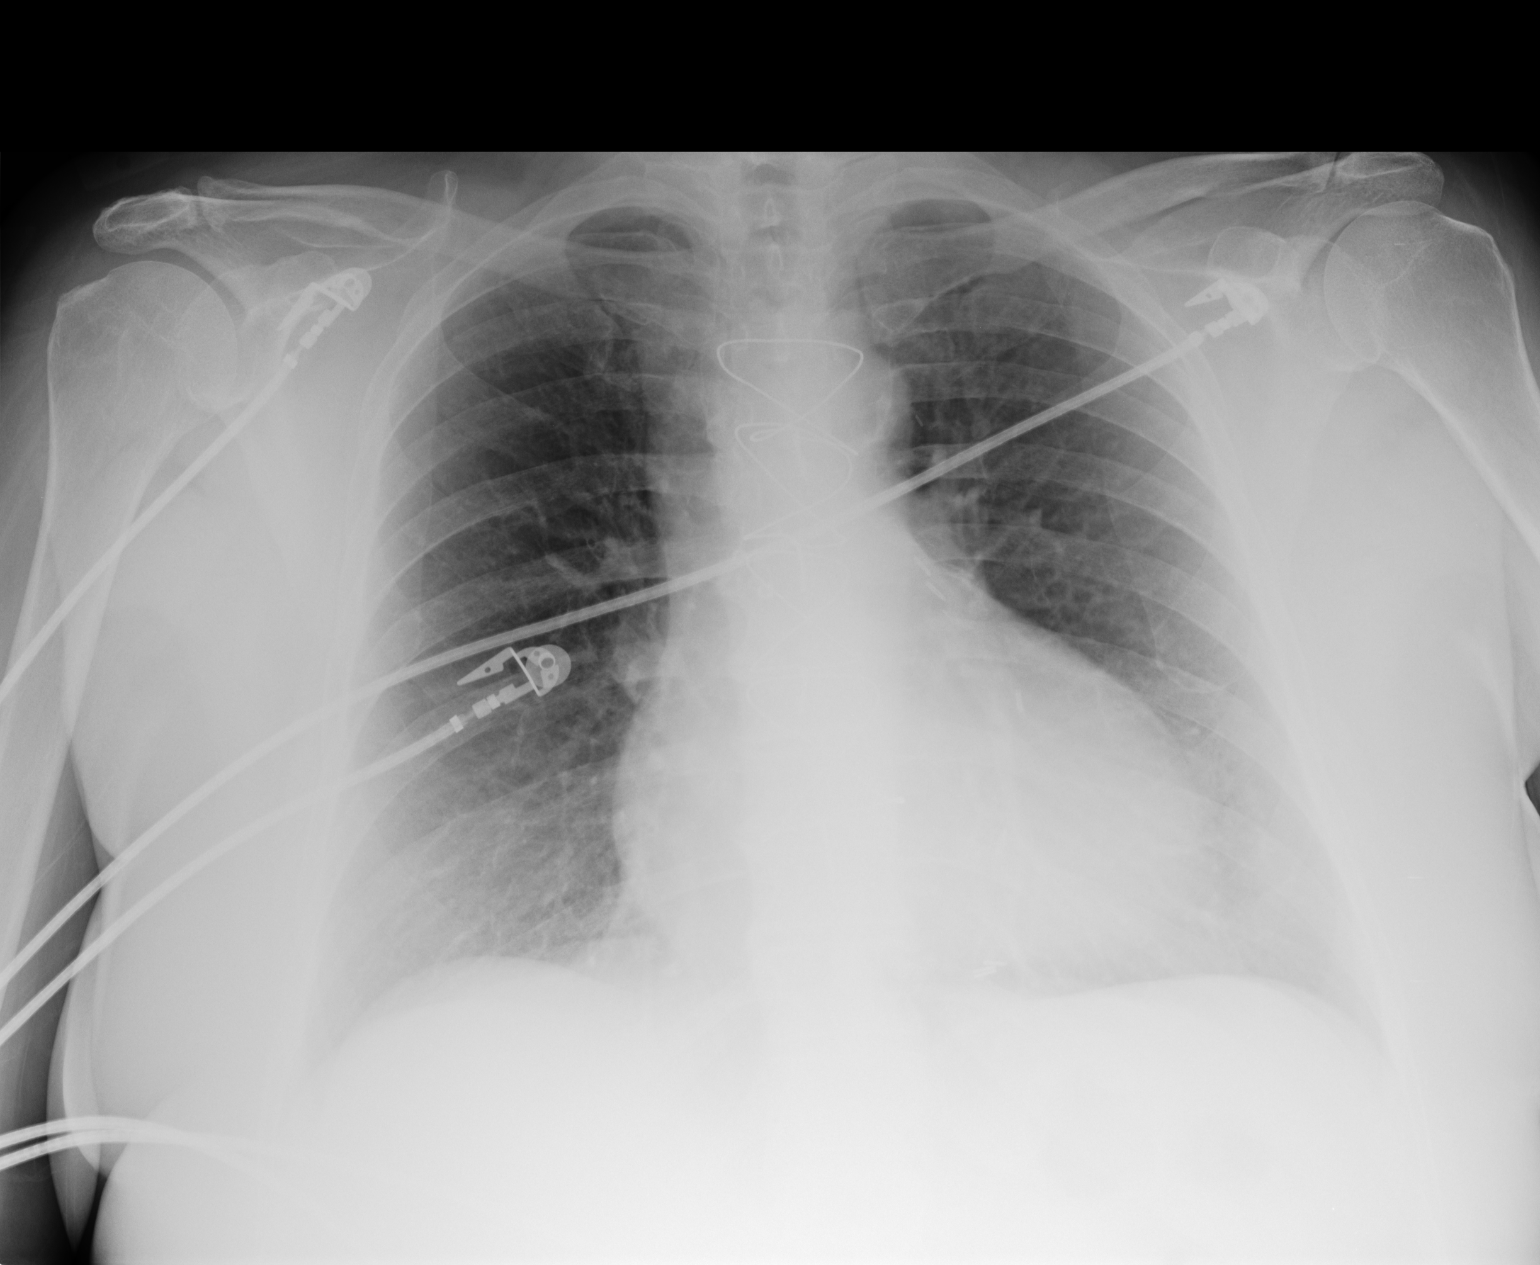

[1 of 1 positions shown; findings below may reference images not displayed]

IMPRESSION: 1. Sternotomy changes.
2. Borderline to mild cardiomegaly.

## 2008-10-12 ENCOUNTER — Emergency Department: Payer: Self-pay | Admitting: Internal Medicine

## 2008-10-19 IMAGING — CR DG CHEST 2V
1 series · 2 of 2 positions shown · non-contrast
Comparison: none

REASON FOR EXAM: diff breathing ptin rm 14
COMMENTS:

[Series 1: view not recorded · 0.17mm/px · 2 of 2 slices shown]
[im 1/2]
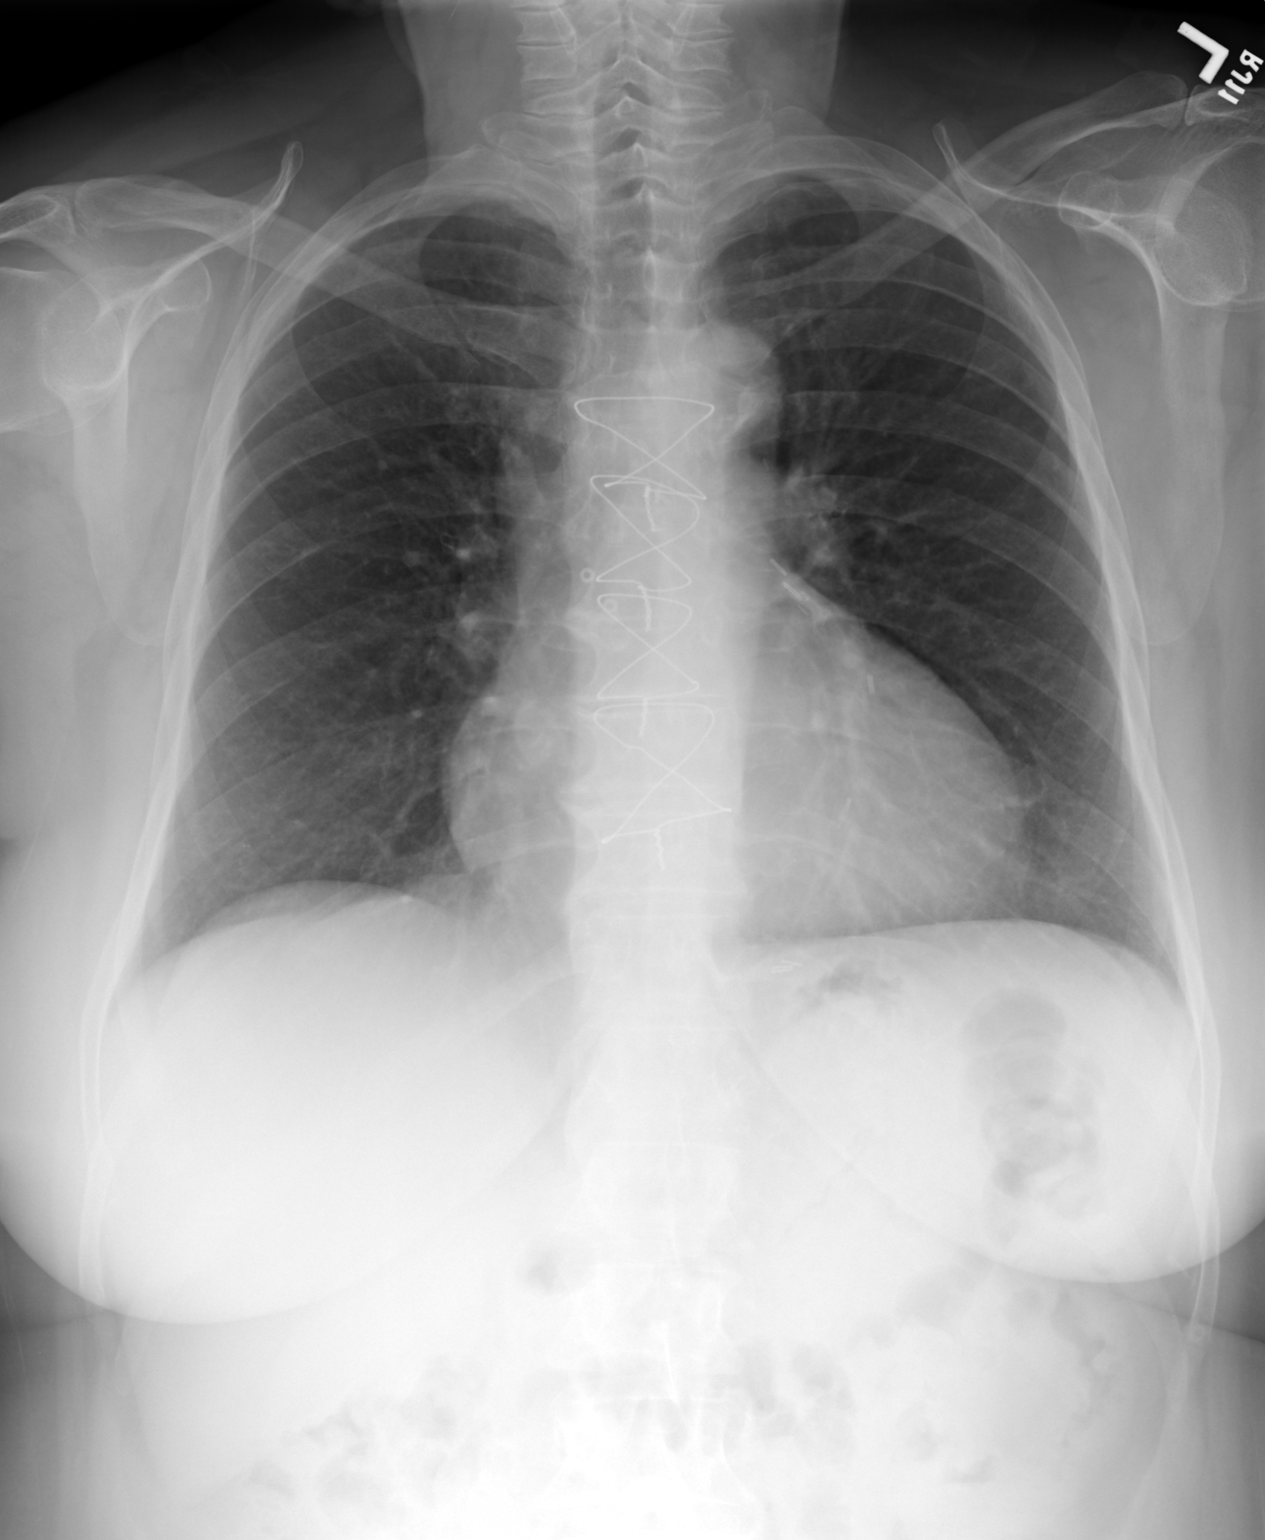
[im 2/2]
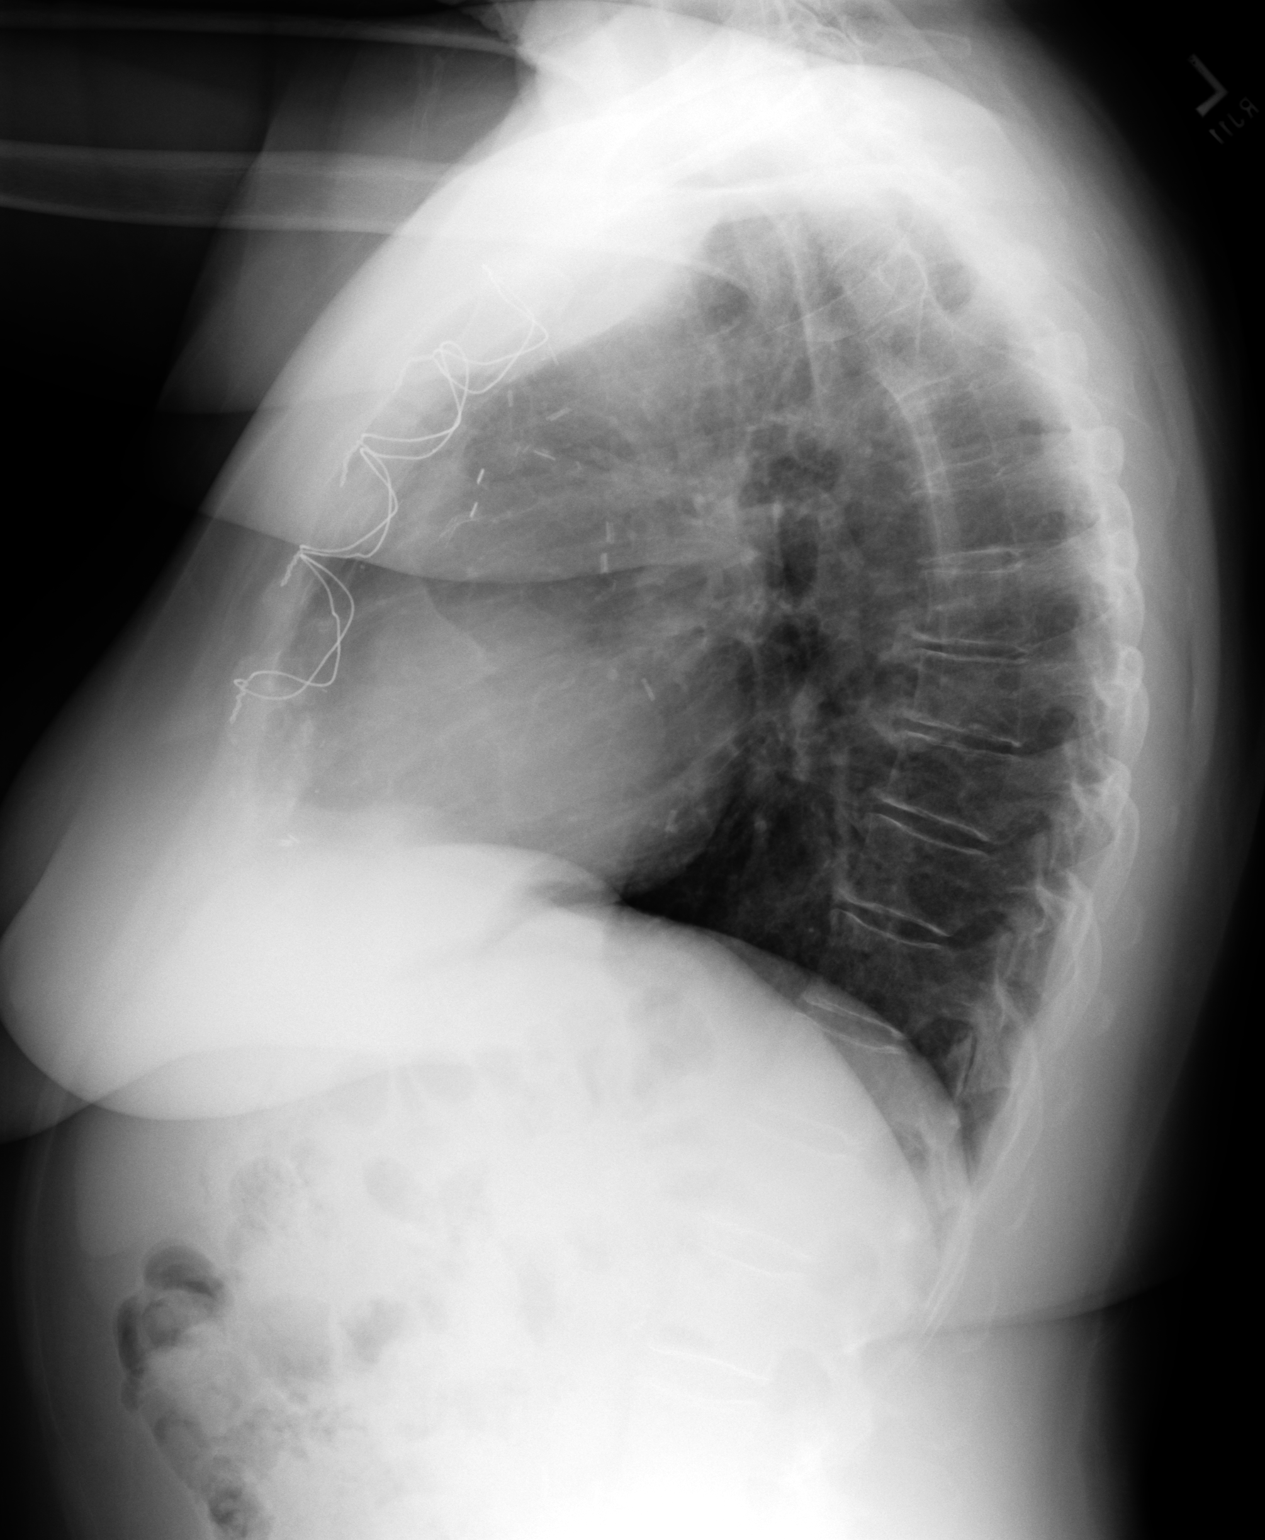

[2 of 2 positions shown; findings below may reference images not displayed]

PROCEDURE:     DXR - DXR CHEST PA (OR AP) AND LATERAL  - August 07, 2006  [DATE]

RESULT:       Comparison is made to a prior study dated 07/02/06.  There is
flattening of the hemidiaphragms and increased AP diameter of the chest.
The cardiac silhouette is enlarged indicative of cardiomegaly.  There is no
evidence of focal infiltrates, effusions or edema.  The patient is status
post median sternotomy and coronary artery bypass grafting.
IMPRESSION: 1.     COPD without evidence of acute cardiopulmonary disease.
2.     Cardiomegaly.

## 2008-11-12 IMAGING — CR DG CHEST 2V
1 series · 2 of 2 positions shown · non-contrast
Comparison: none

REASON FOR EXAM: CP intermittently x months; [HOSPITAL]
COMMENTS:

[Series 1: view not recorded · 0.17mm/px · 2 of 2 slices shown]
[im 1/2]
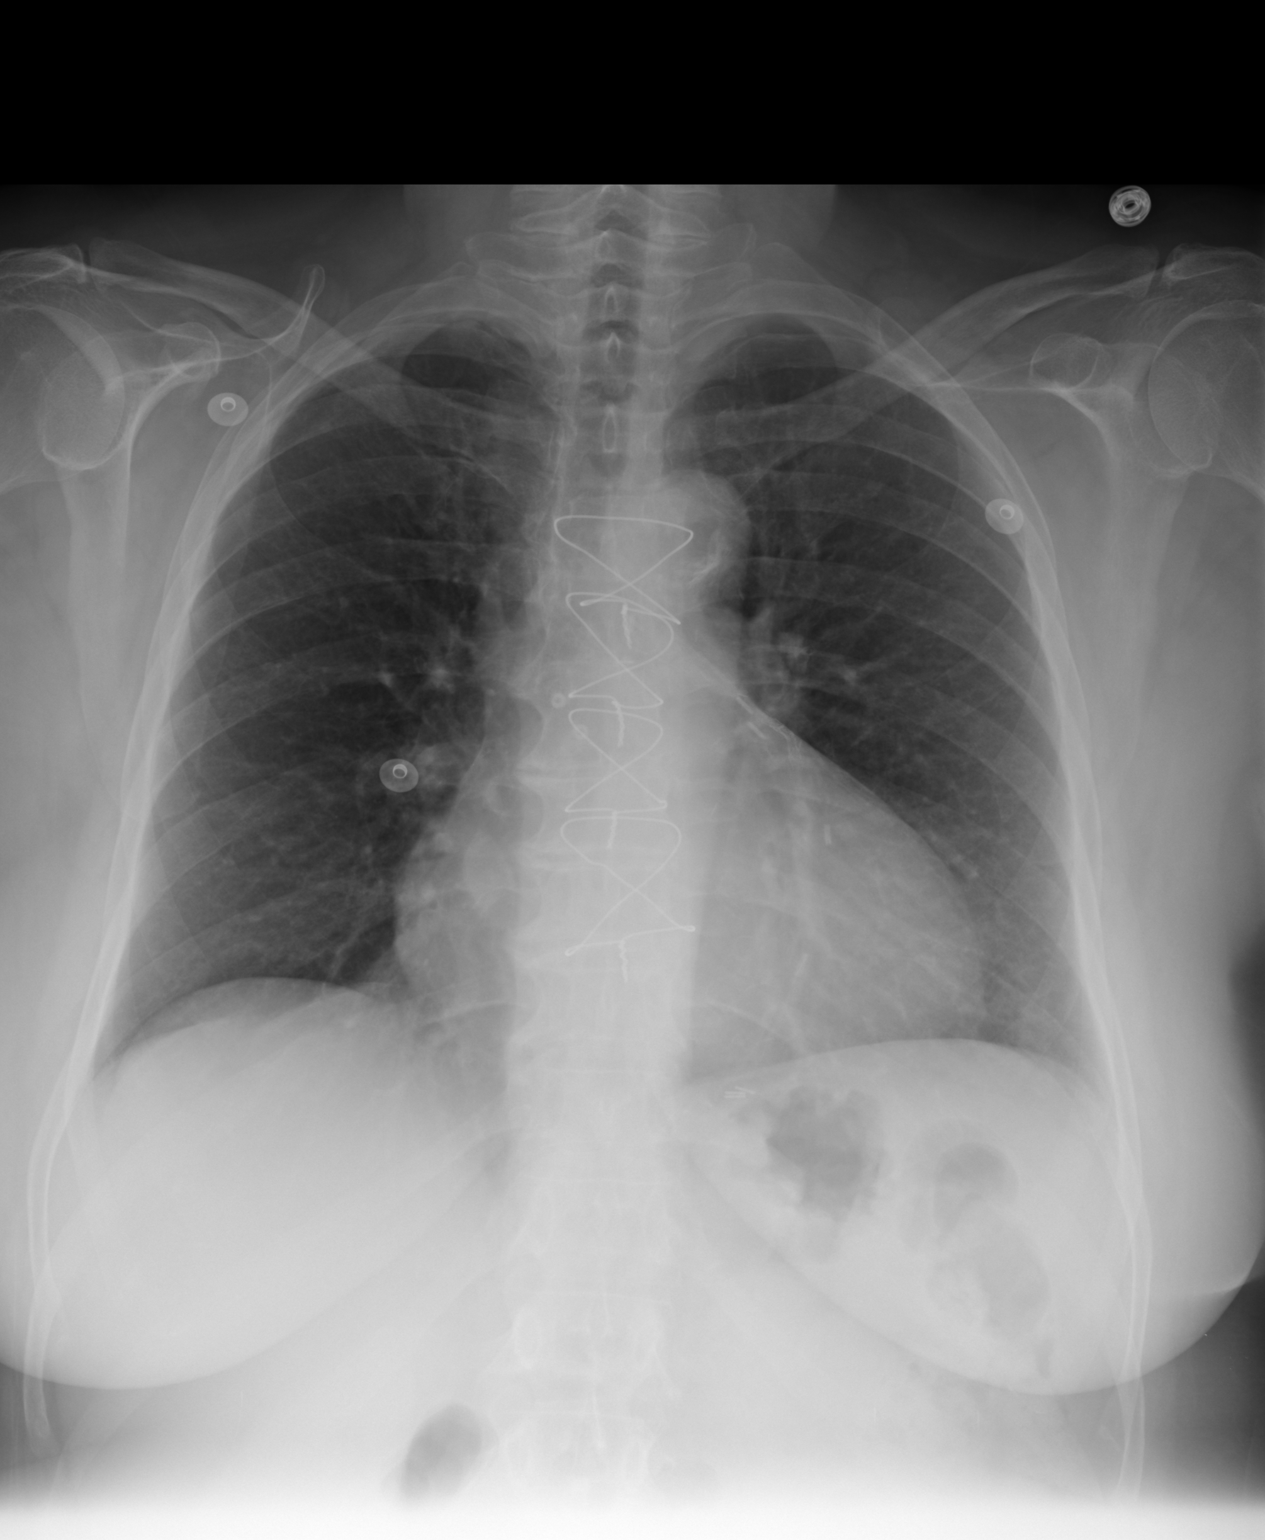
[im 2/2]
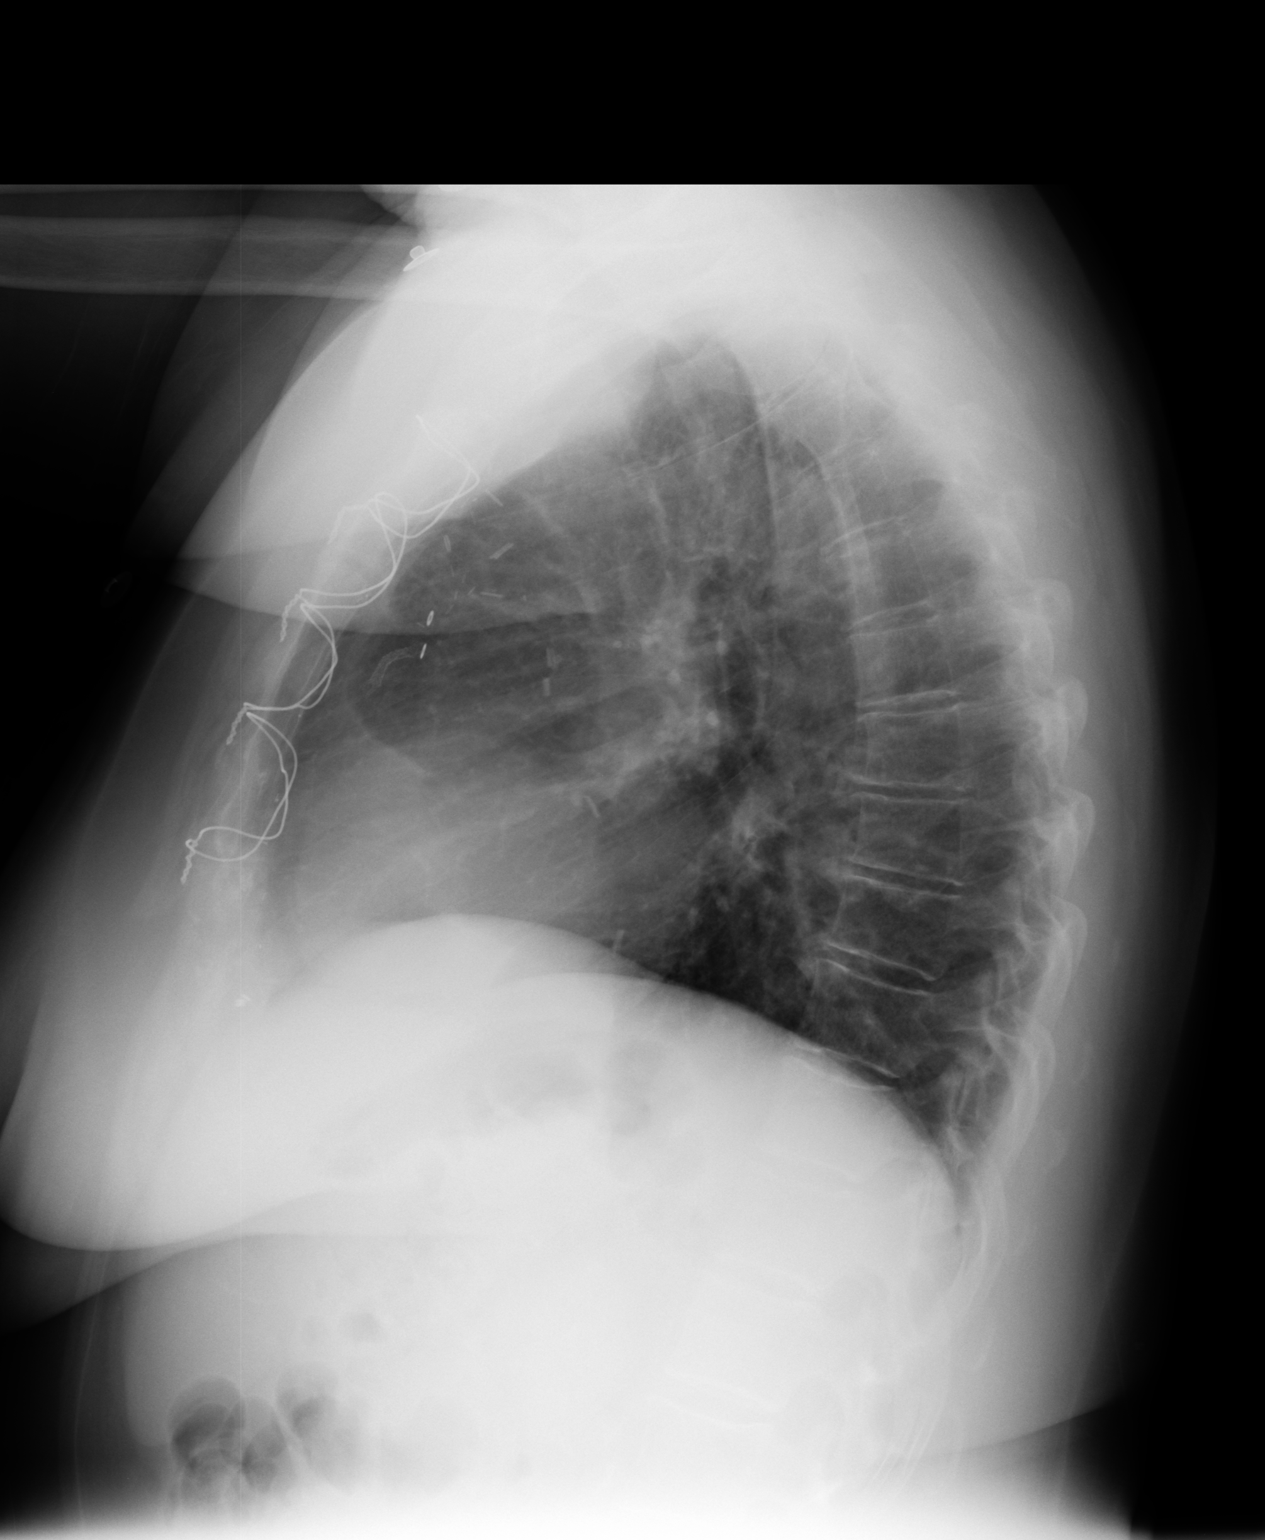

[2 of 2 positions shown; findings below may reference images not displayed]

PROCEDURE:     DXR - DXR CHEST PA (OR AP) AND LATERAL  - August 31, 2006  [DATE]

RESULT:     Comparison is made to the prior study dated 08/07/06.

The patient is taken a shallow inspiration.  There is no evidence of focal
infiltrates, effusions, or edema.  The cardiac silhouette is moderately
enlarged. The patient is status post median sternotomy and coronary artery
bypass grafting.  The visualized bony skeleton demonstrates no evidence of
fracture or dislocation.
IMPRESSION: No evidence of acute cardiopulmonary disease.

Cardiomegaly.

## 2008-11-24 IMAGING — CR DG CHEST 2V
1 series · 2 of 2 positions shown · non-contrast
Comparison: none

REASON FOR EXAM: right pain
COMMENTS:

[Series 1: view not recorded · 0.17mm/px · 2 of 2 slices shown]
[im 1/2]
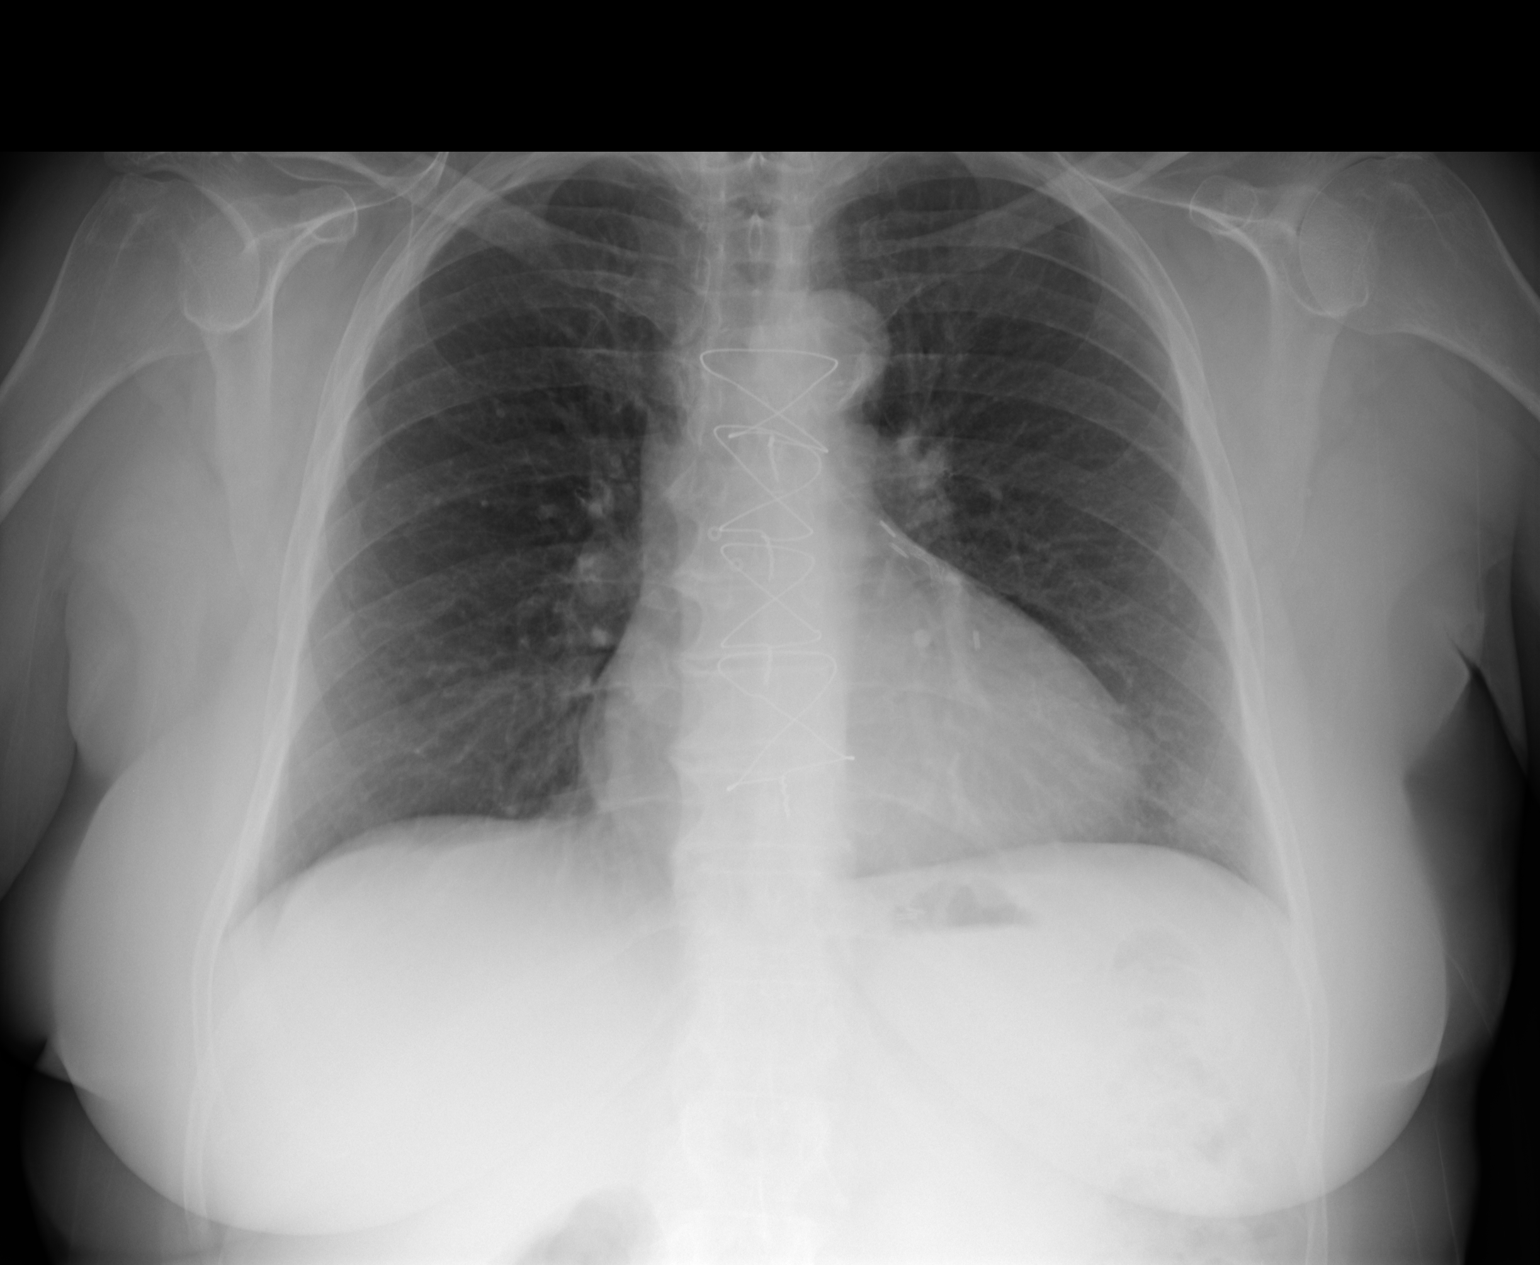
[im 2/2]
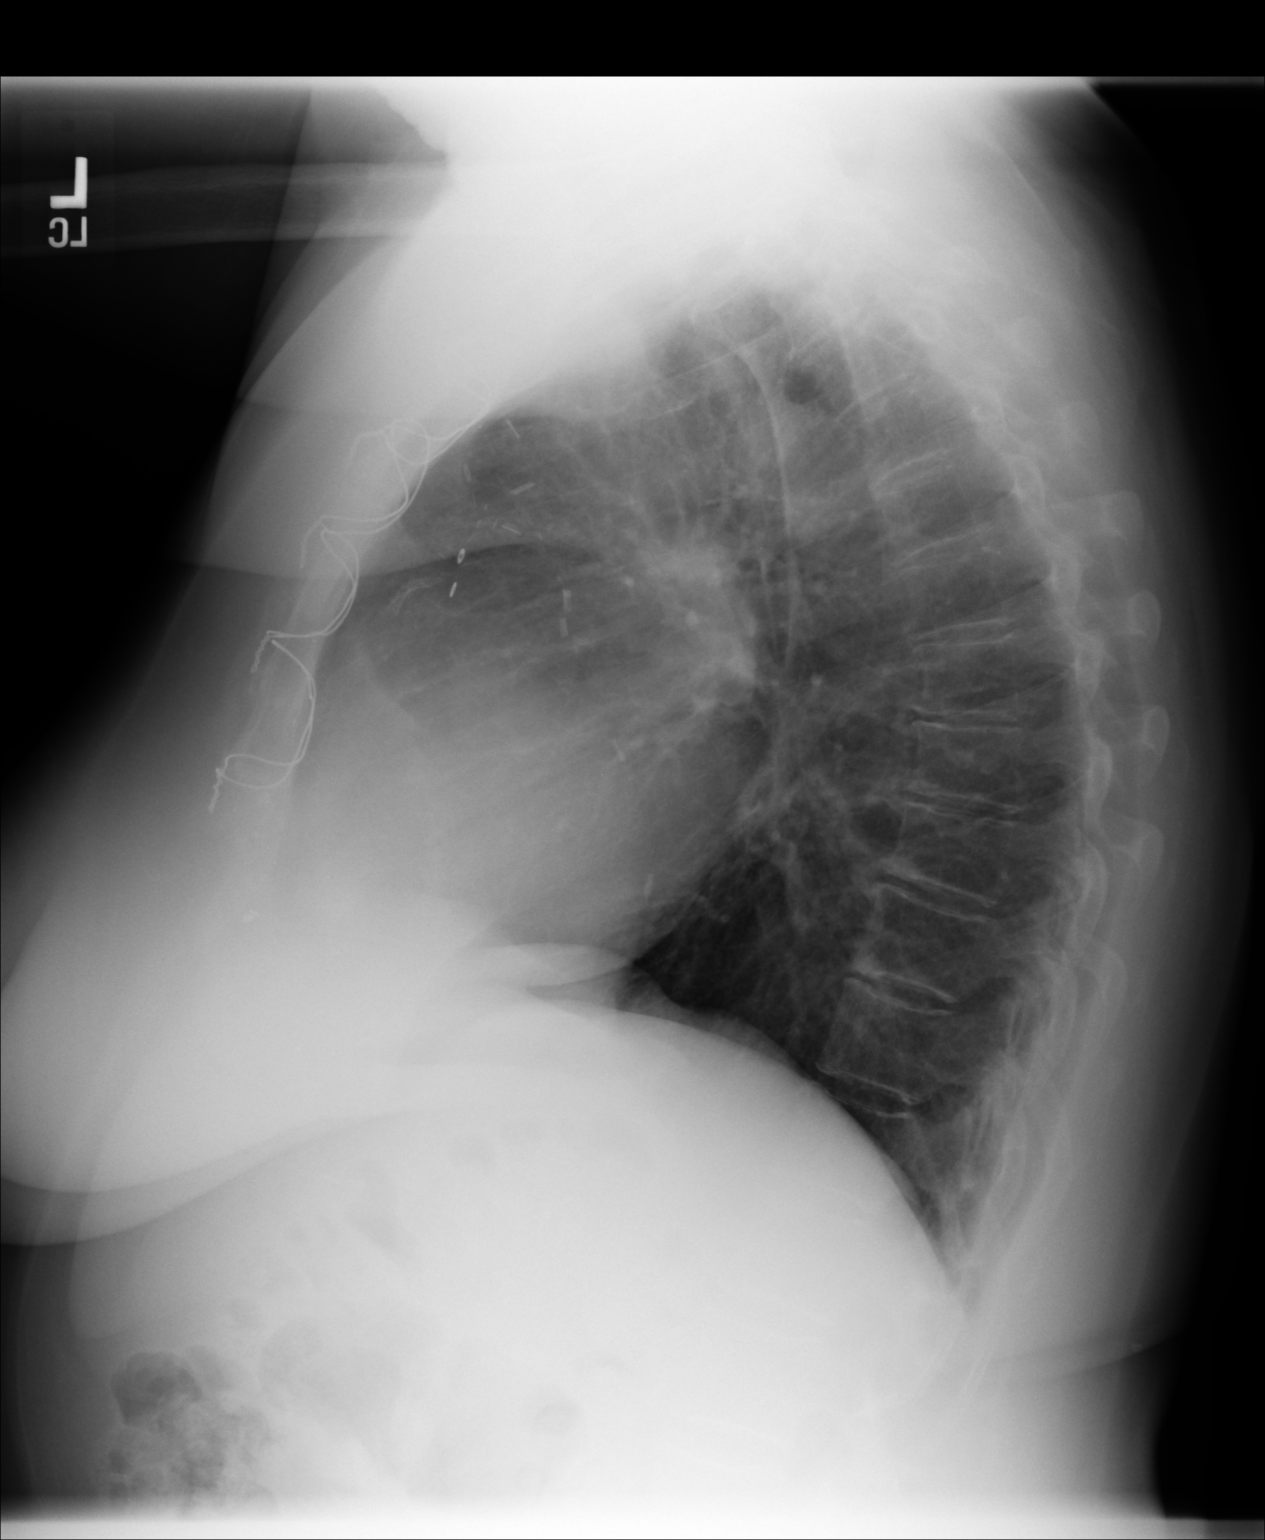

[2 of 2 positions shown; findings below may reference images not displayed]

PROCEDURE:     DXR - DXR CHEST PA (OR AP) AND LATERAL  - September 12, 2006  [DATE]

RESULT:     Comparison is made to images dated 08/31/2006. The cardiac
silhouette is borderline enlarged. The lungs are clear. CABG changes are
present. There is no infiltrate, effusion or pneumothorax. There is no
failure.
IMPRESSION: 1. Stable chest x-ray.
2. No acute cardiopulmonary disease.

## 2008-12-27 ENCOUNTER — Emergency Department: Payer: Self-pay | Admitting: Emergency Medicine

## 2009-01-24 ENCOUNTER — Emergency Department: Payer: Self-pay

## 2009-02-25 IMAGING — CR DG CHEST 1V PORT
1 series · 1 of 1 positions shown · non-contrast
Comparison: none

REASON FOR EXAM: Chest Pain
COMMENTS:

PROCEDURE:     DXR - DXR PORTABLE CHEST SINGLE VIEW  - December 14, 2006  [DATE]
RESULT:     Comparison is made of the study of 07/02/2006. The cardiac
silhouette is enlarged. Sternotomy wires are present. There is no
infiltrate, effusion or pneumothorax. There is no definite pulmonary edema.

[view not recorded]
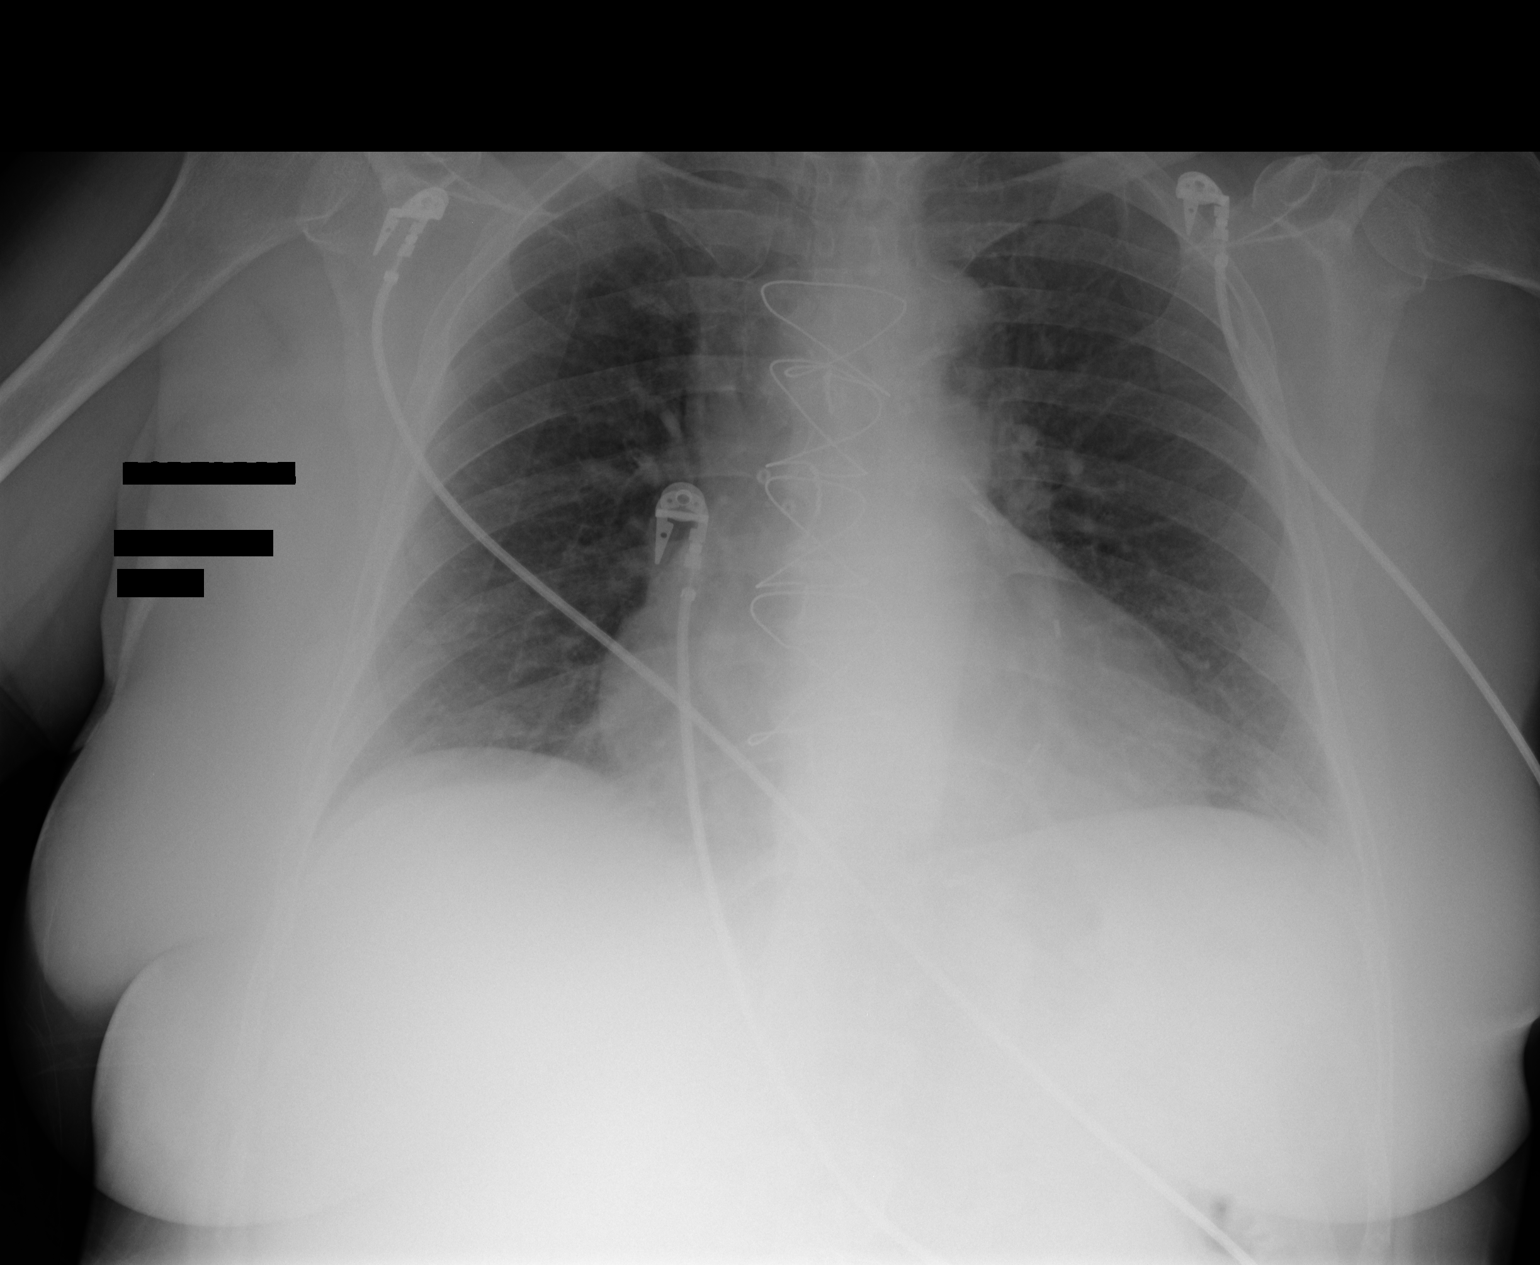

[1 of 1 positions shown; findings below may reference images not displayed]

IMPRESSION: Cardiomegaly. Sternotomy wires are noted. No acute
cardiopulmonary disease demonstrated.

## 2009-04-10 IMAGING — CR DG CHEST 2V
1 series · 2 of 2 positions shown · non-contrast
Comparison: none

REASON FOR EXAM: Shortness of breath, cough
COMMENTS:

PROCEDURE:     DXR - DXR CHEST PA (OR AP) AND LATERAL  - January 27, 2007  [DATE]
RESULT:     Comparison is made to a prior exam of 12/14/2006.
The lung fields are clear. The heart, mediastinal and osseous structures
show no acute changes. Post operative changes of prior CABG are again noted.

[Series 1: view not recorded · 0.17mm/px · 2 of 2 slices shown]
[im 1/2]
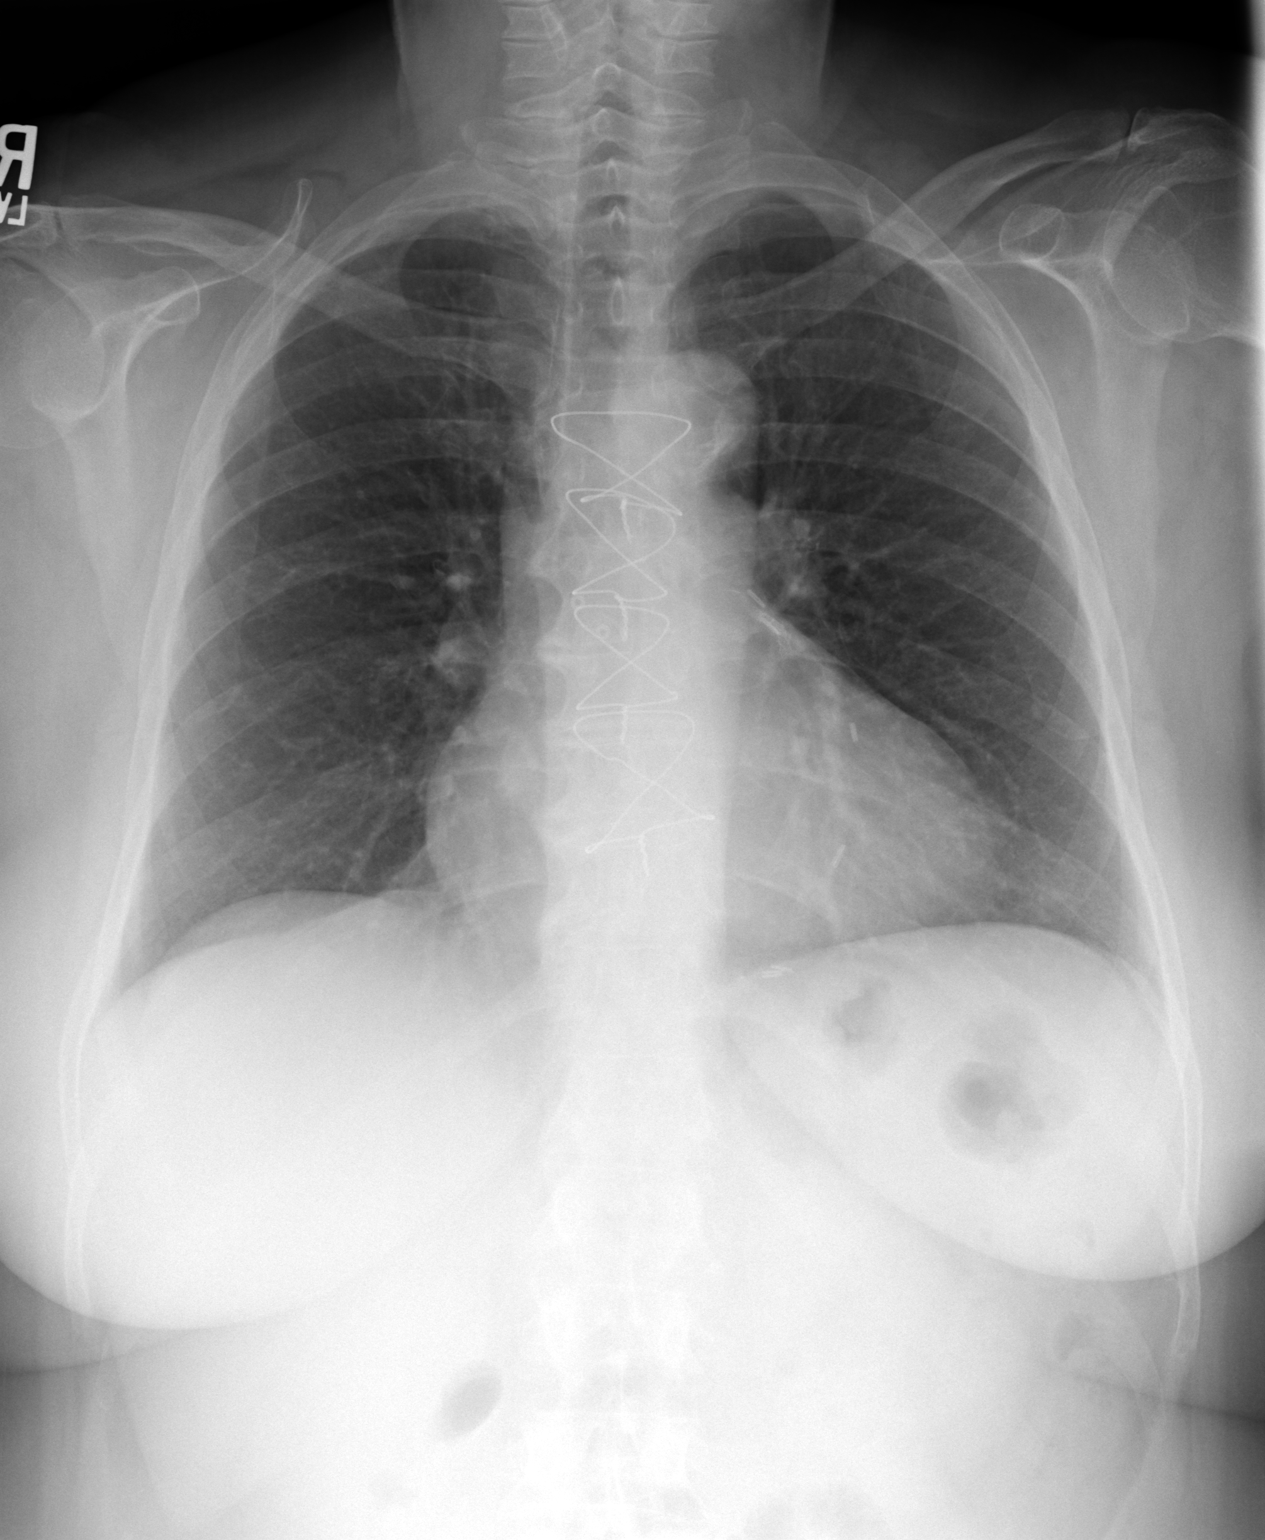
[im 2/2]
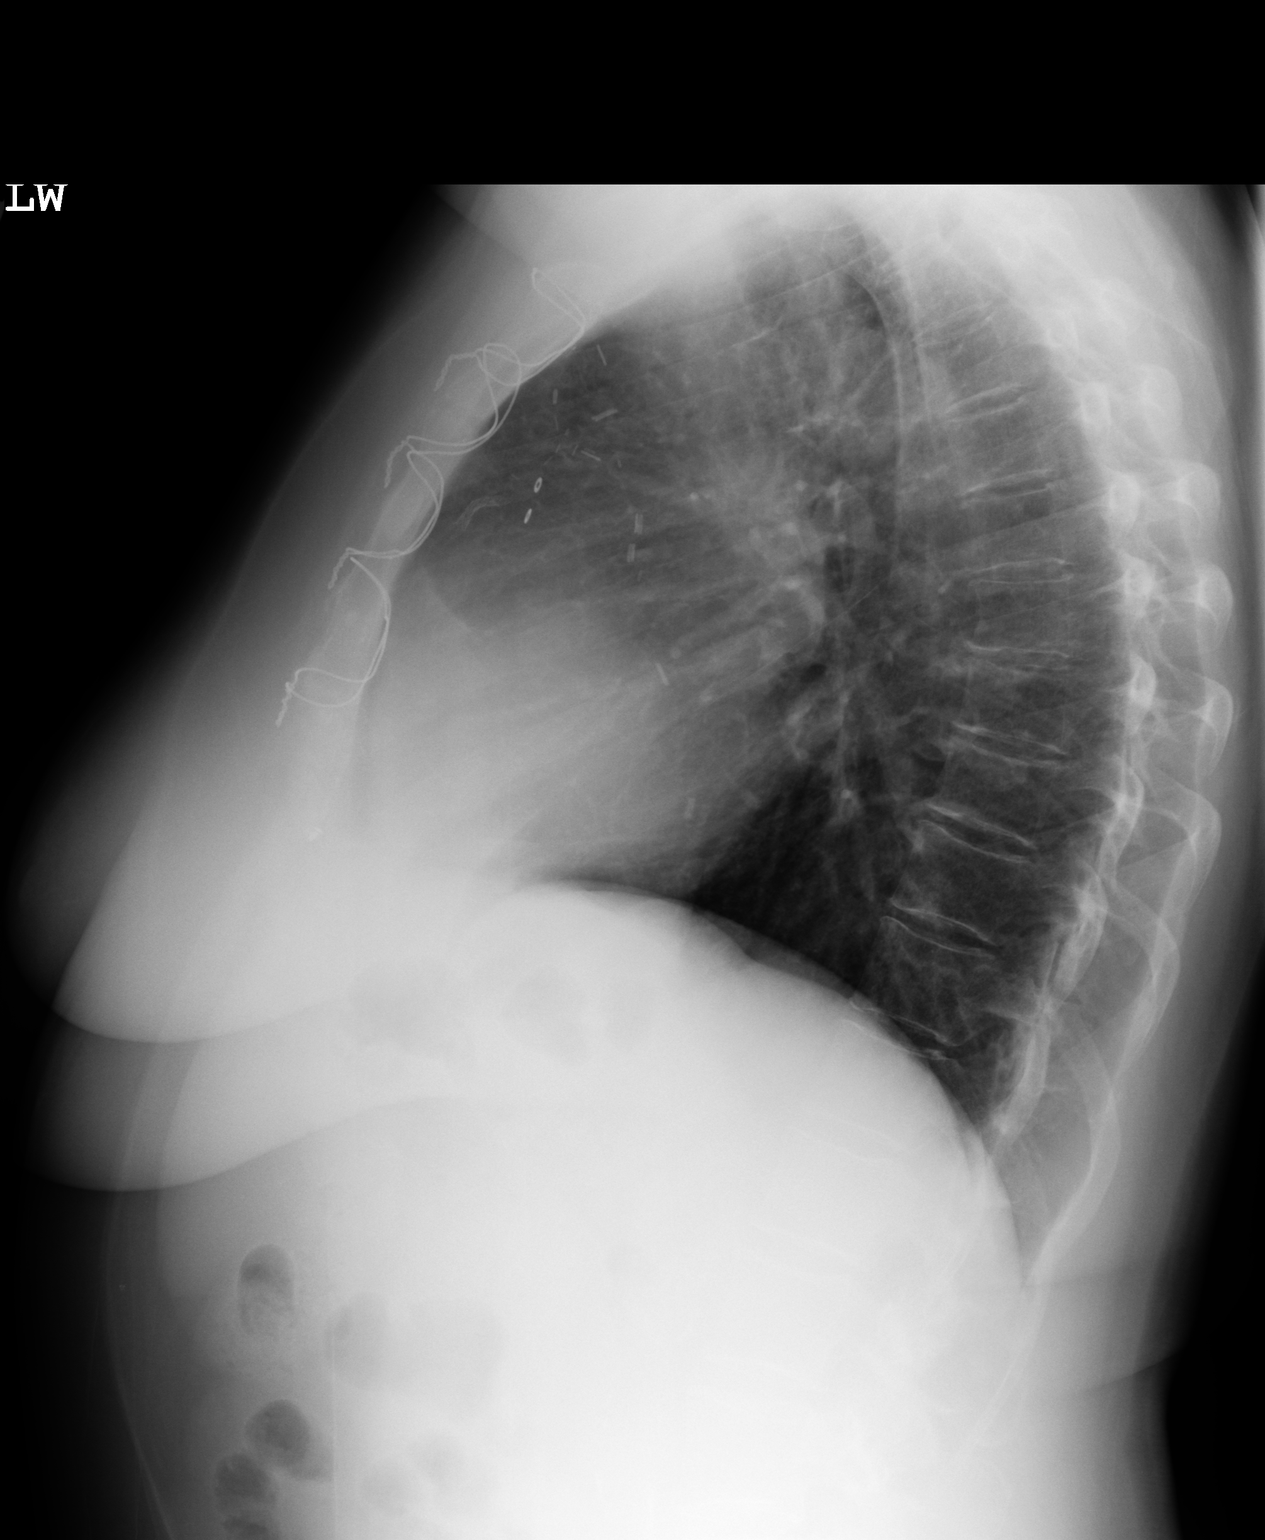

[2 of 2 positions shown; findings below may reference images not displayed]

IMPRESSION: No acute changes are identified.

## 2009-06-06 ENCOUNTER — Emergency Department: Payer: Self-pay | Admitting: Emergency Medicine

## 2009-06-11 IMAGING — CR DG CHEST 1V PORT
1 series · 1 of 1 positions shown · non-contrast
Comparison: none

REASON FOR EXAM: Chest pain
COMMENTS:

[view not recorded]
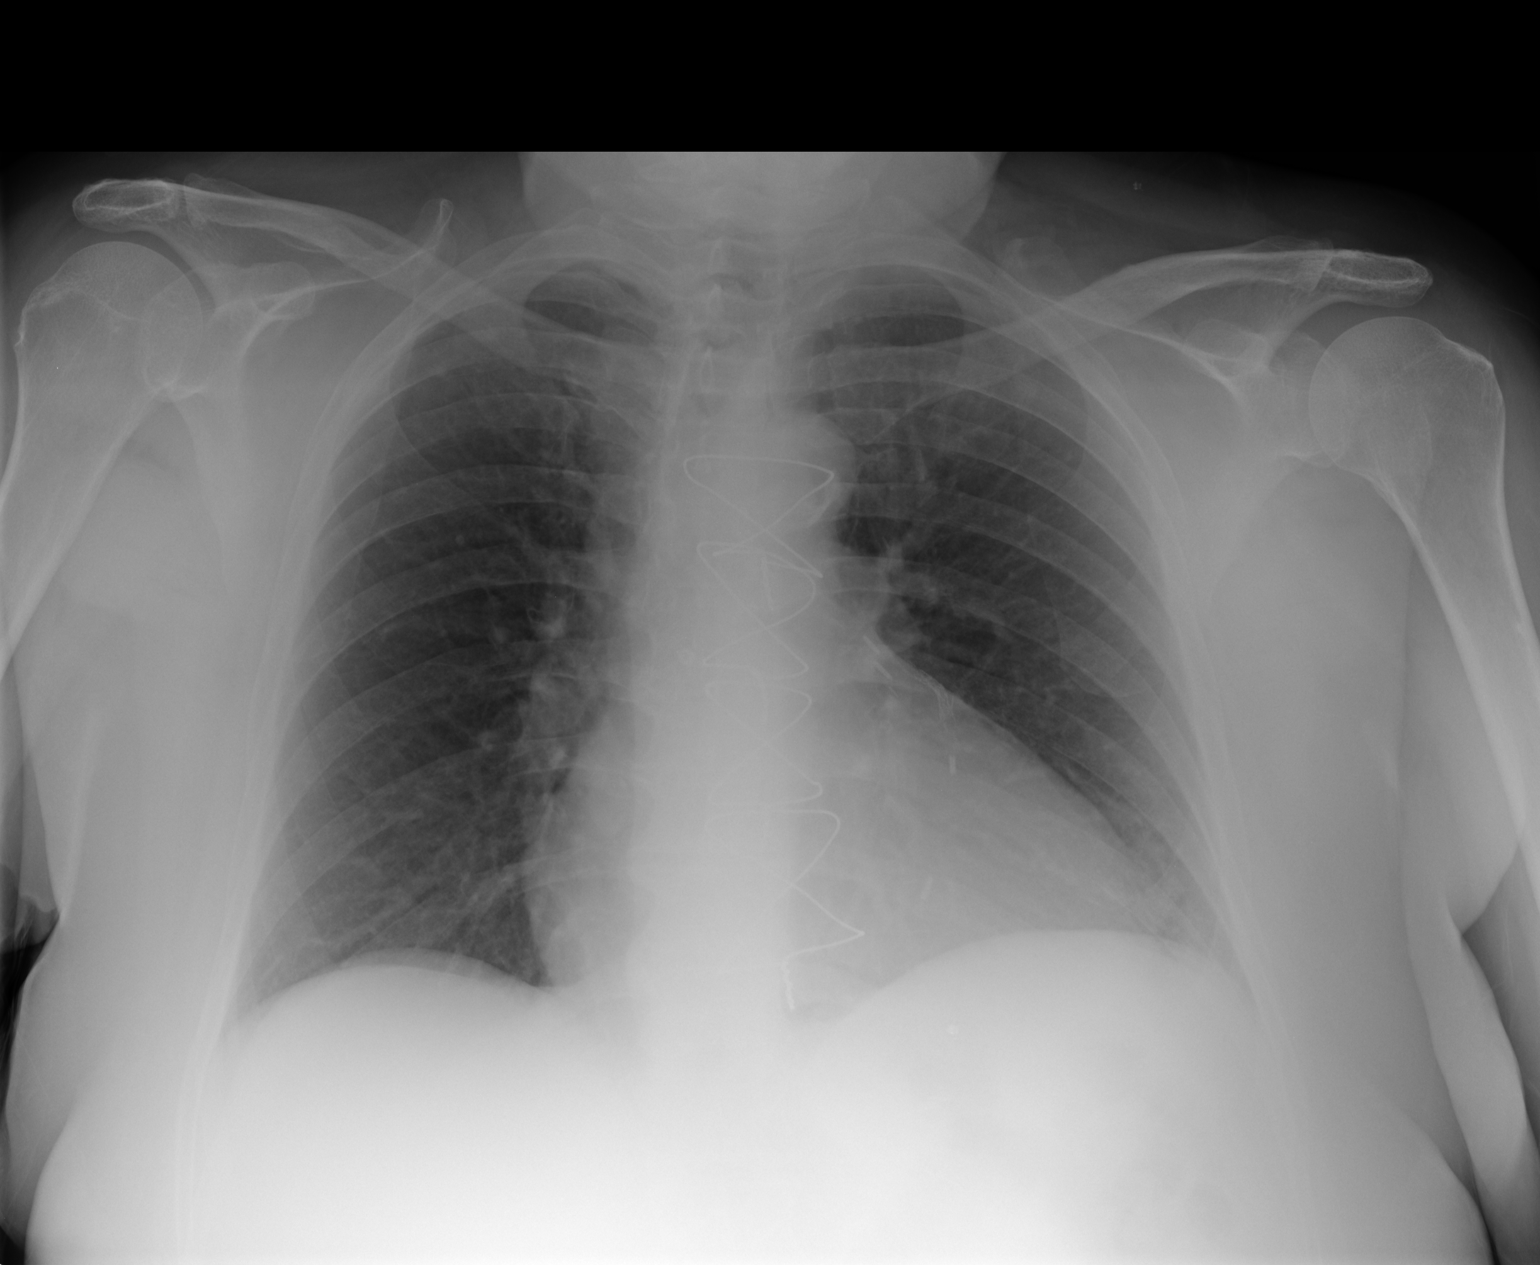

[1 of 1 positions shown; findings below may reference images not displayed]

PROCEDURE:     DXR - DXR PORTABLE CHEST SINGLE VIEW  - March 30, 2007  [DATE]

RESULT:     Single frontal view of the chest is performed.

Comparison is made to a prior study dated 12/14/2006.

There is no evidence of focal infiltrates, effusions or edema. The cardiac
silhouette is enlarged indicative of cardiomegaly. The patient is status
post median sternotomy.
IMPRESSION: No evidence of acute cardiopulmonary disease.

## 2009-07-06 IMAGING — CT CT STONE STUDY
1 of 2 series · 15 of 32 positions shown, 19 images · non-contrast
Comparison: none

REASON FOR EXAM: pain
COMMENTS:

[Series 2: stone · axial · 0.72mm/px · z∈[+22,+428]mm · 15 of 153 slices shown, 19 images]
[im 12/153  soft-tissue]
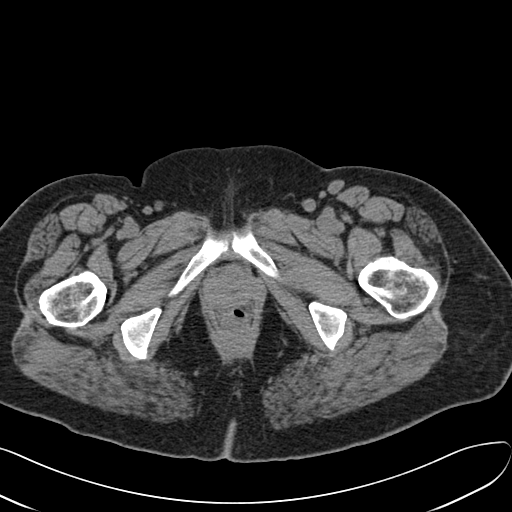
[im 12/153  bone]
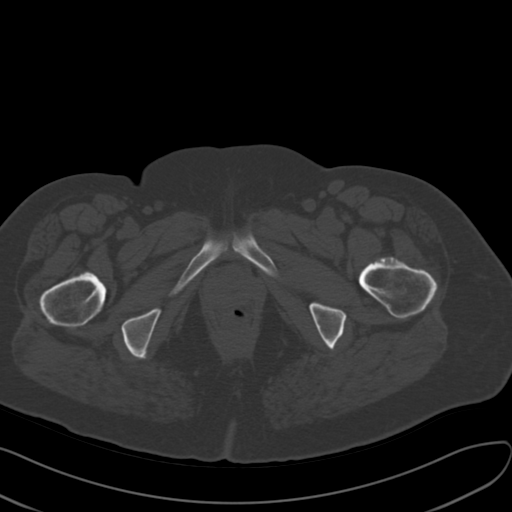
[im 23/153  soft-tissue]
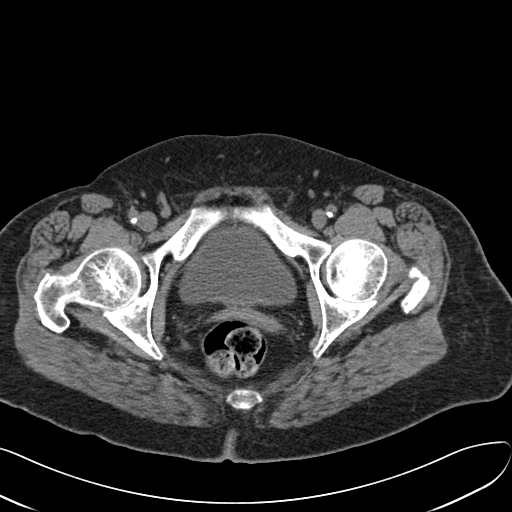
[im 34/153  soft-tissue]
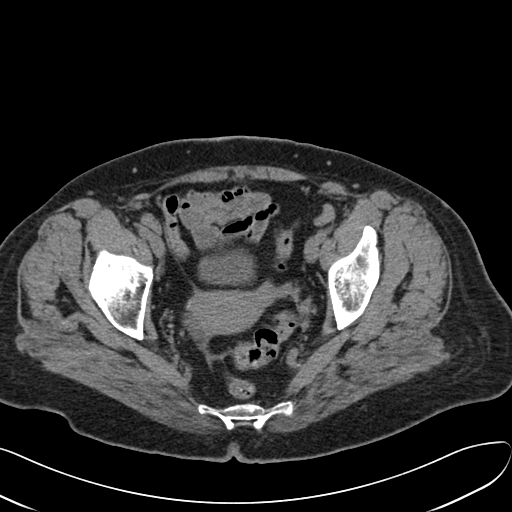
[im 46/153  soft-tissue]
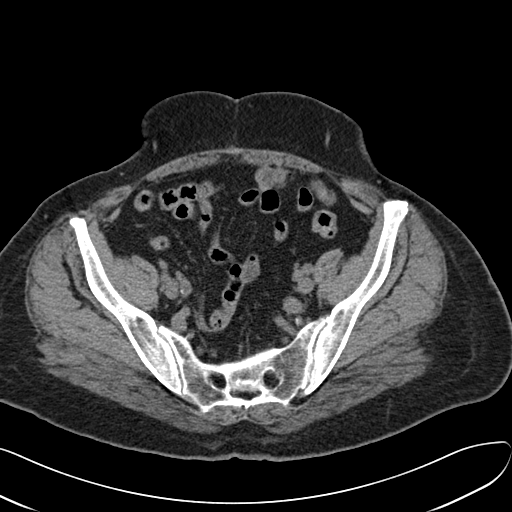
[im 57/153  soft-tissue]
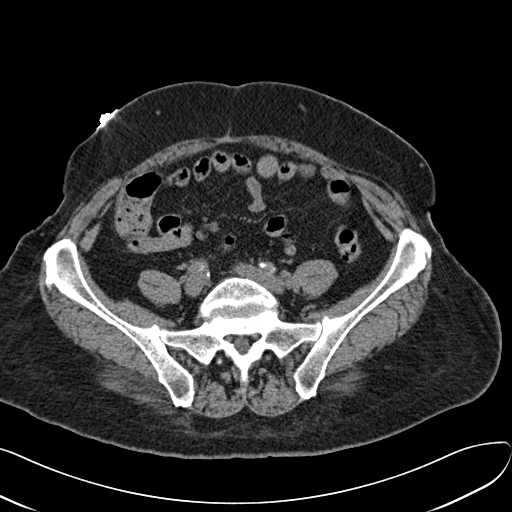
[im 68/153  soft-tissue]
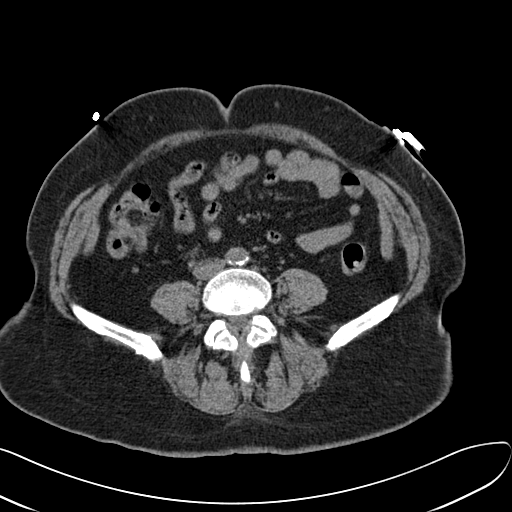
[im 79/153  soft-tissue]
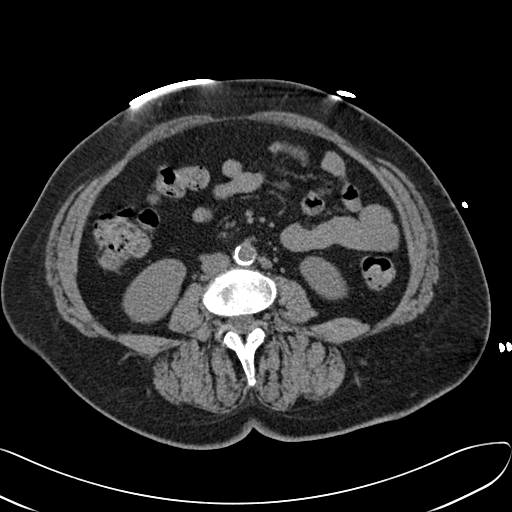
[im 91/153  soft-tissue]
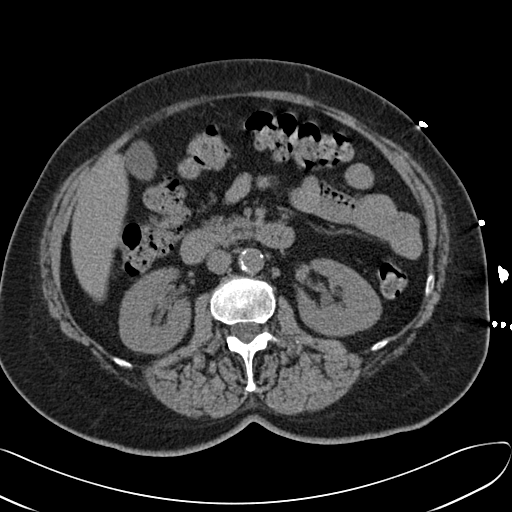
[im 102/153  soft-tissue]
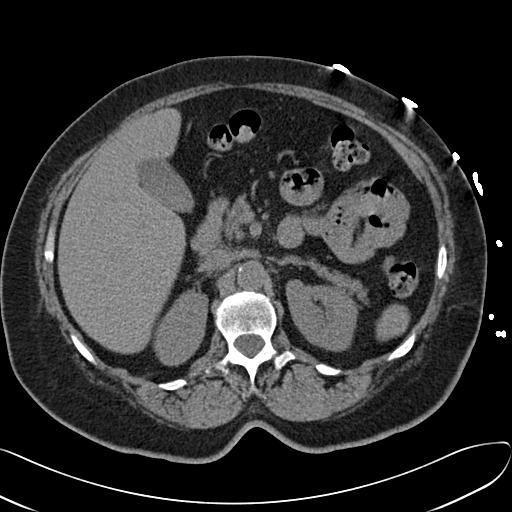
[im 102/153  bone]
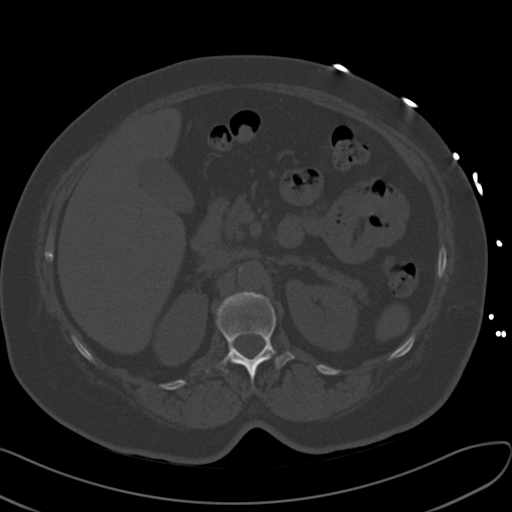
[im 113/153  soft-tissue]
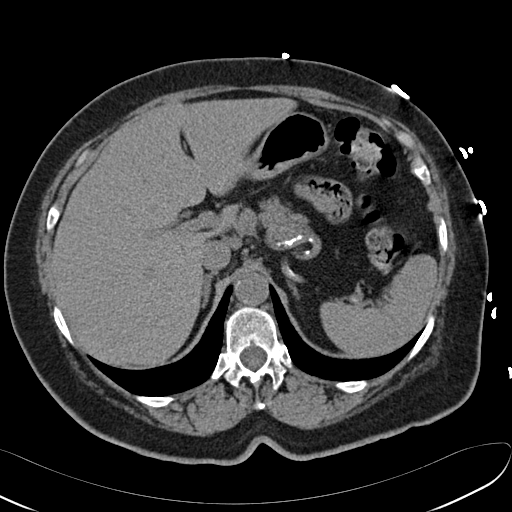
[im 124/153  soft-tissue]
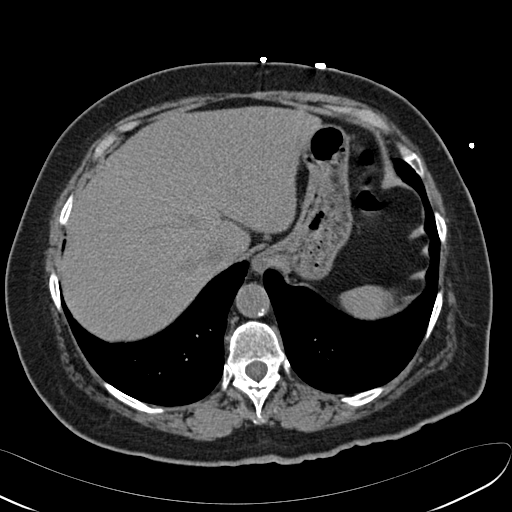
[im 130/153  lung]
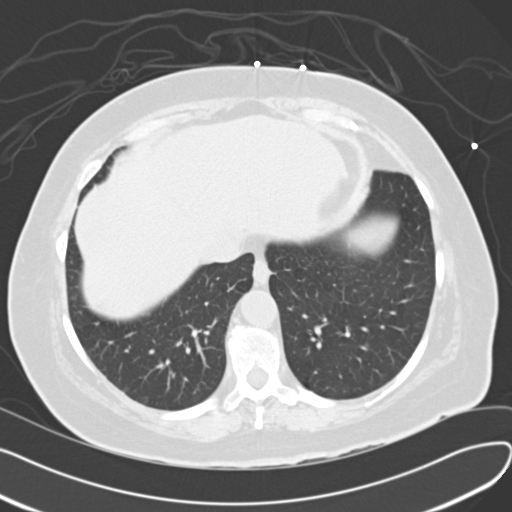
[im 136/153  soft-tissue]
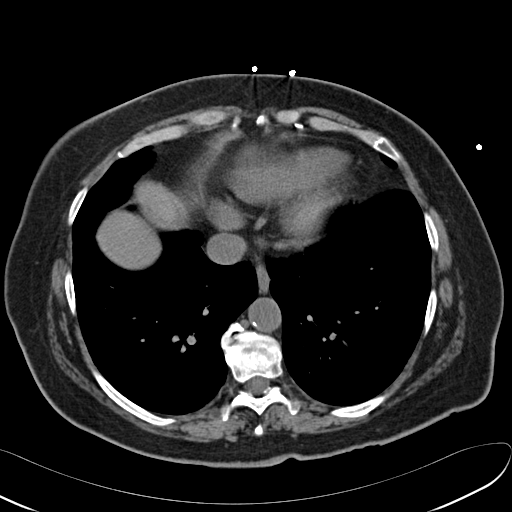
[im 136/153  lung]
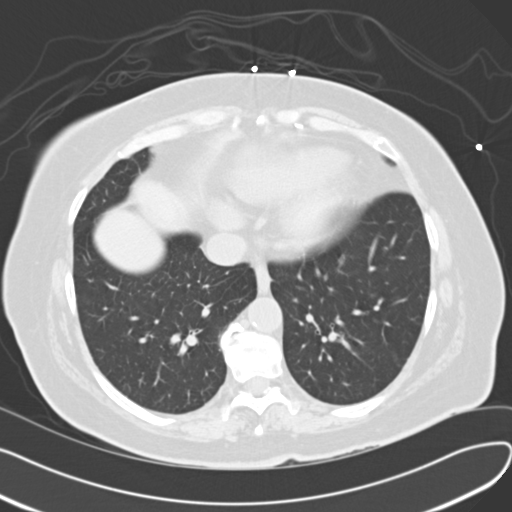
[im 141/153  lung]
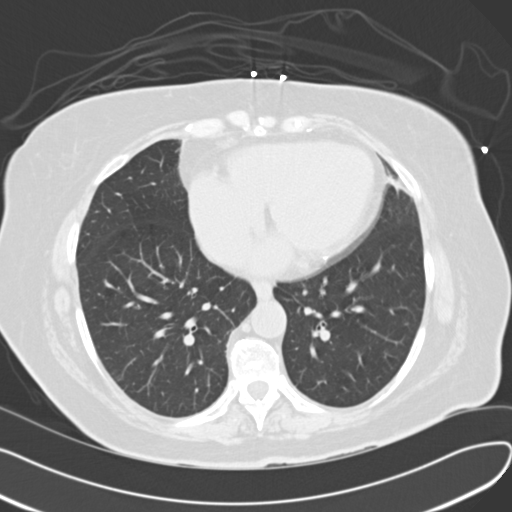
[im 147/153  soft-tissue]
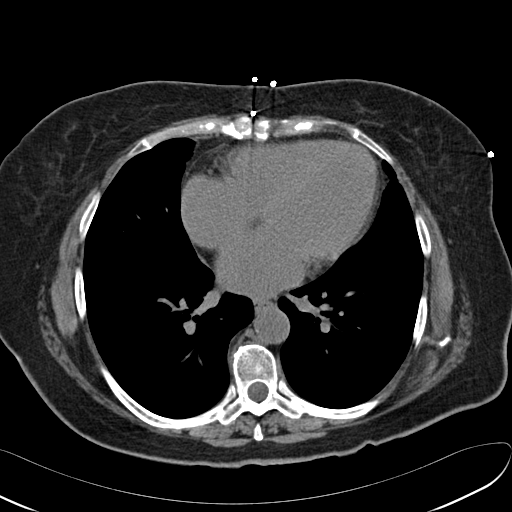
[im 147/153  lung]
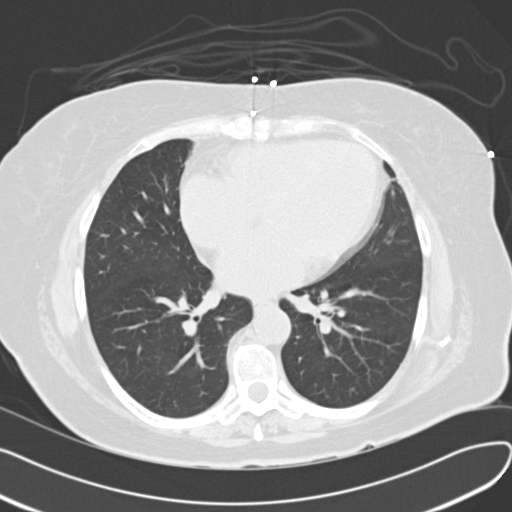

[15 of 32 positions shown; findings below may reference images not displayed]

PROCEDURE:     CT  - CT ABDOMEN /PELVIS WO (STONE)  - April 24, 2007  [DATE]

RESULT:     Helical noncontrasted 3 mm sections were obtained from the lung
bases through the pubic symphysis. Evaluation of the lung bases demonstrates
no gross abnormalities.

Within the limitations of a noncontrasted CT, the liver, spleen, adrenals
and pancreas are unremarkable.  There is no evidence of hydronephrosis,
hydroureter, ureterolithiasis or  nephrolithiasis. A low attenuating mass
projects within the posterior upper central pelvis region of the LEFT kidney
likely representing a parapelvic cyst. There is no CT evidence of bowel
obstruction, diverticulitis, colitis or appendicitis.  A small amount of
free fluid is identified within the pelvis.
IMPRESSION: 1.     Small amount of free fluid within the pelvis. No CT evidence of renal
calculus disease or focal or acute abnormalities.
2.     Dr. Bosenge of the Emergency Department was informed of these
findings via preliminary fax report on 04/24/07 at [DATE] p.m. EST.

## 2009-07-06 IMAGING — CR DG CHEST 1V PORT
1 series · 1 of 1 positions shown · non-contrast
Comparison: none

REASON FOR EXAM: cp
COMMENTS:

[view not recorded]
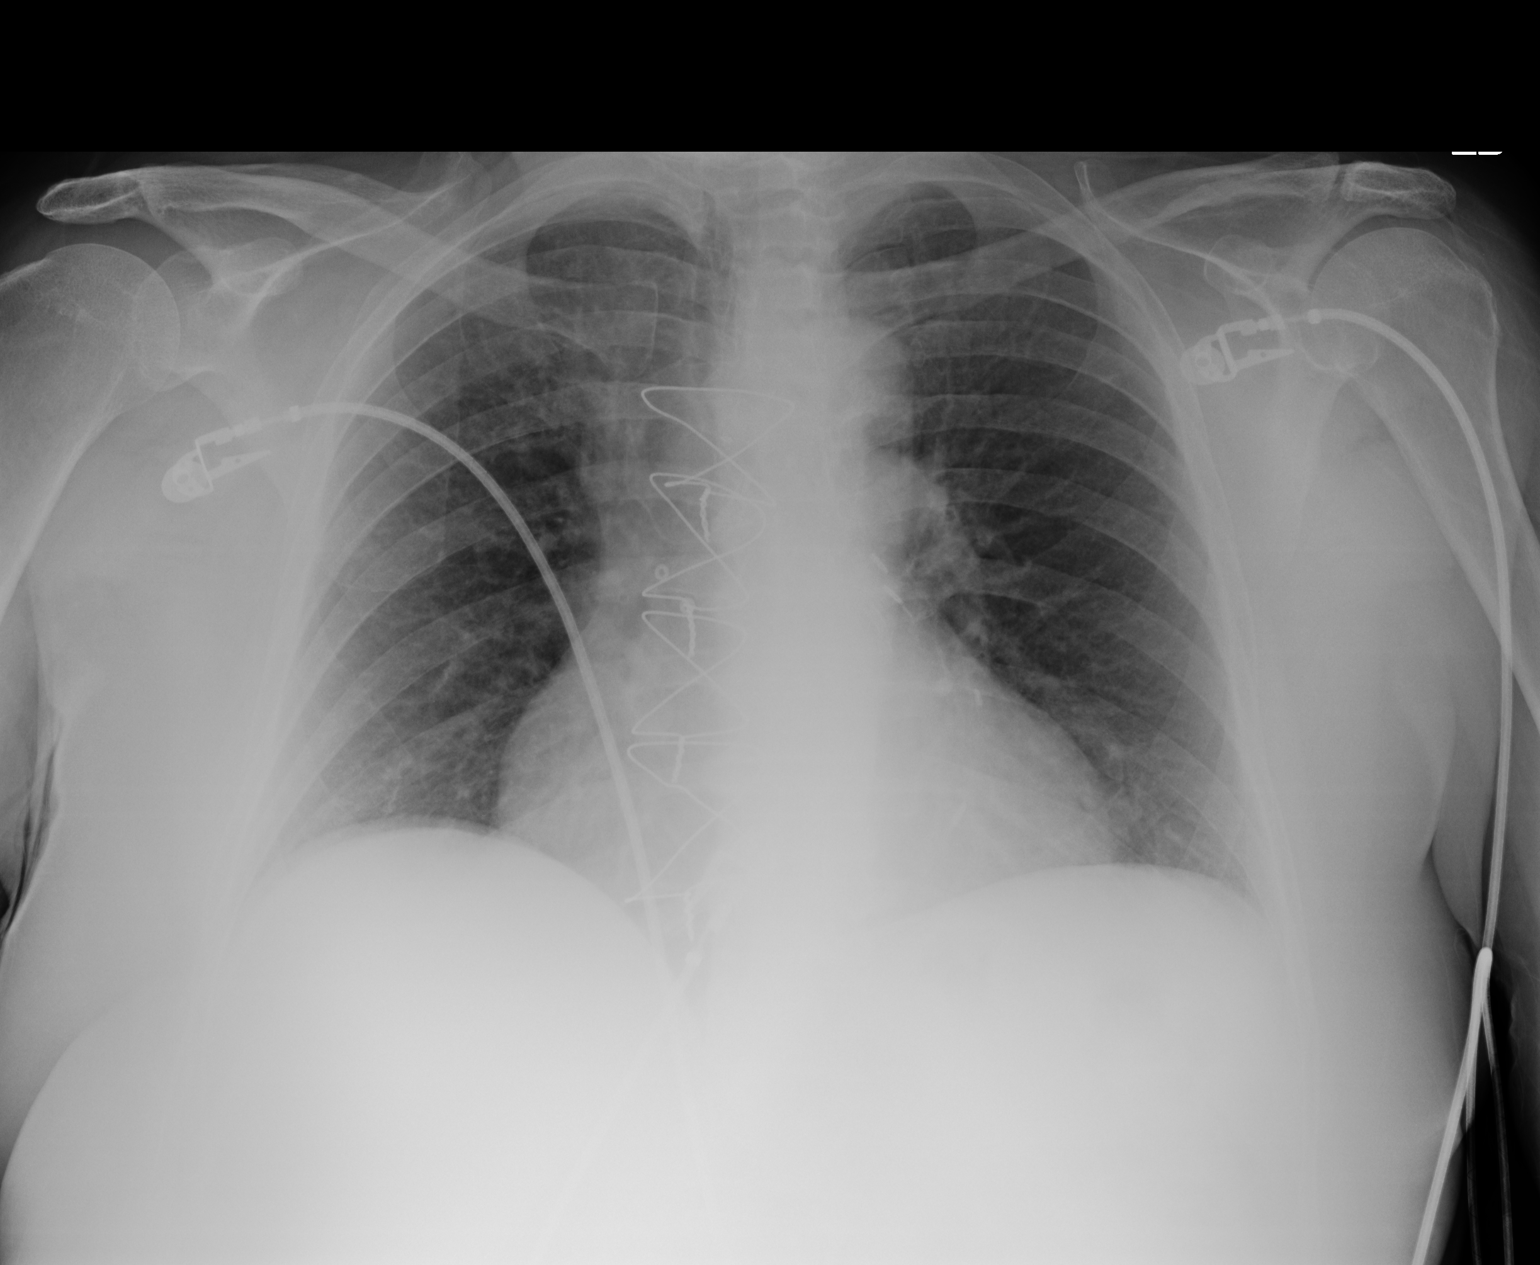

[1 of 1 positions shown; findings below may reference images not displayed]

PROCEDURE:     DXR - DXR PORTABLE CHEST SINGLE VIEW  - April 24, 2007  [DATE]

RESULT:     Comparison is made to a prior study dated 03/30/07.

The patient has taken a shallow inspiration. There is no evidence of focal
infiltrates, effusions or edema. The cardiac silhouette is moderately
enlarged. The patient is status post median sternotomy and coronary artery
bypass grafting. The visualized bony skeleton is unremarkable.
IMPRESSION: Shallow inspiration without evidence of acute cardiopulmonary disease.

## 2009-07-31 ENCOUNTER — Emergency Department: Payer: Self-pay | Admitting: Emergency Medicine

## 2009-09-07 ENCOUNTER — Emergency Department: Payer: Self-pay | Admitting: Emergency Medicine

## 2009-09-25 IMAGING — CR DG CHEST 2V
1 series · 2 of 2 positions shown · non-contrast
Comparison: none

REASON FOR EXAM: cp
COMMENTS:

[Series 1: view not recorded · 0.17mm/px · 2 of 2 slices shown]
[im 1/2]
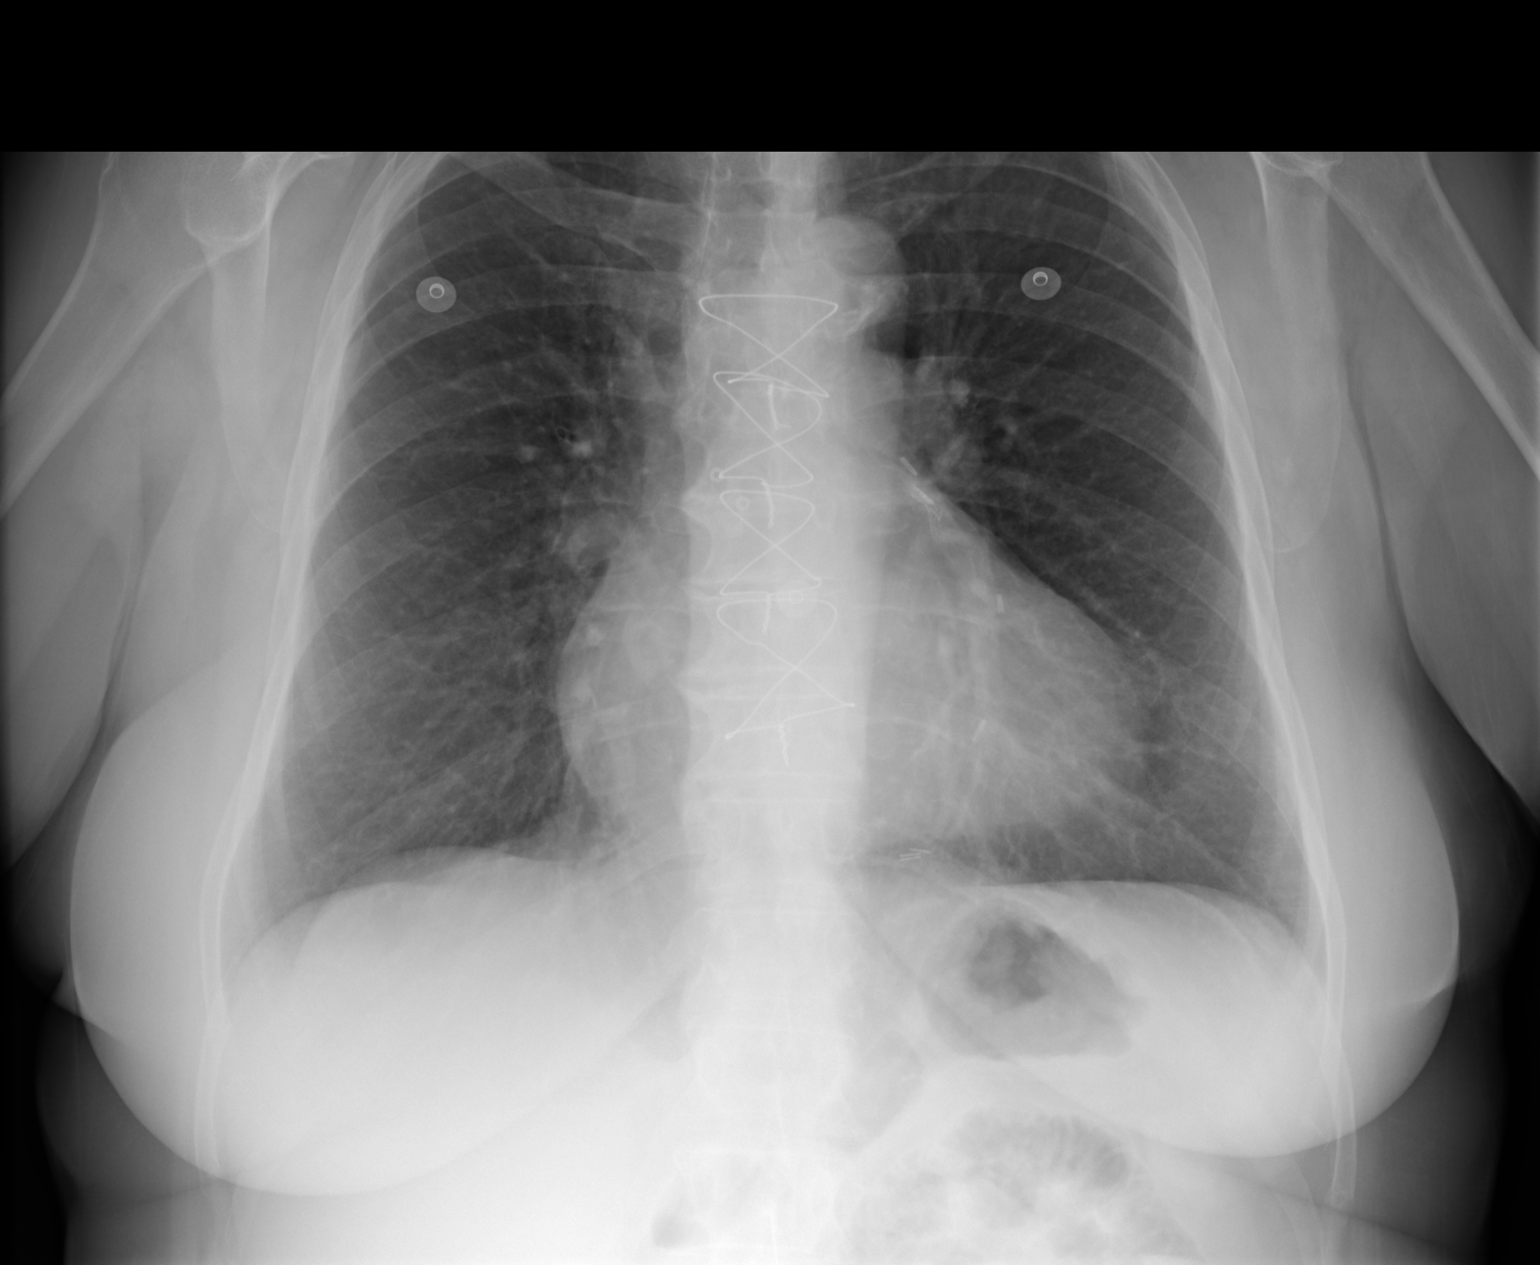
[im 2/2]
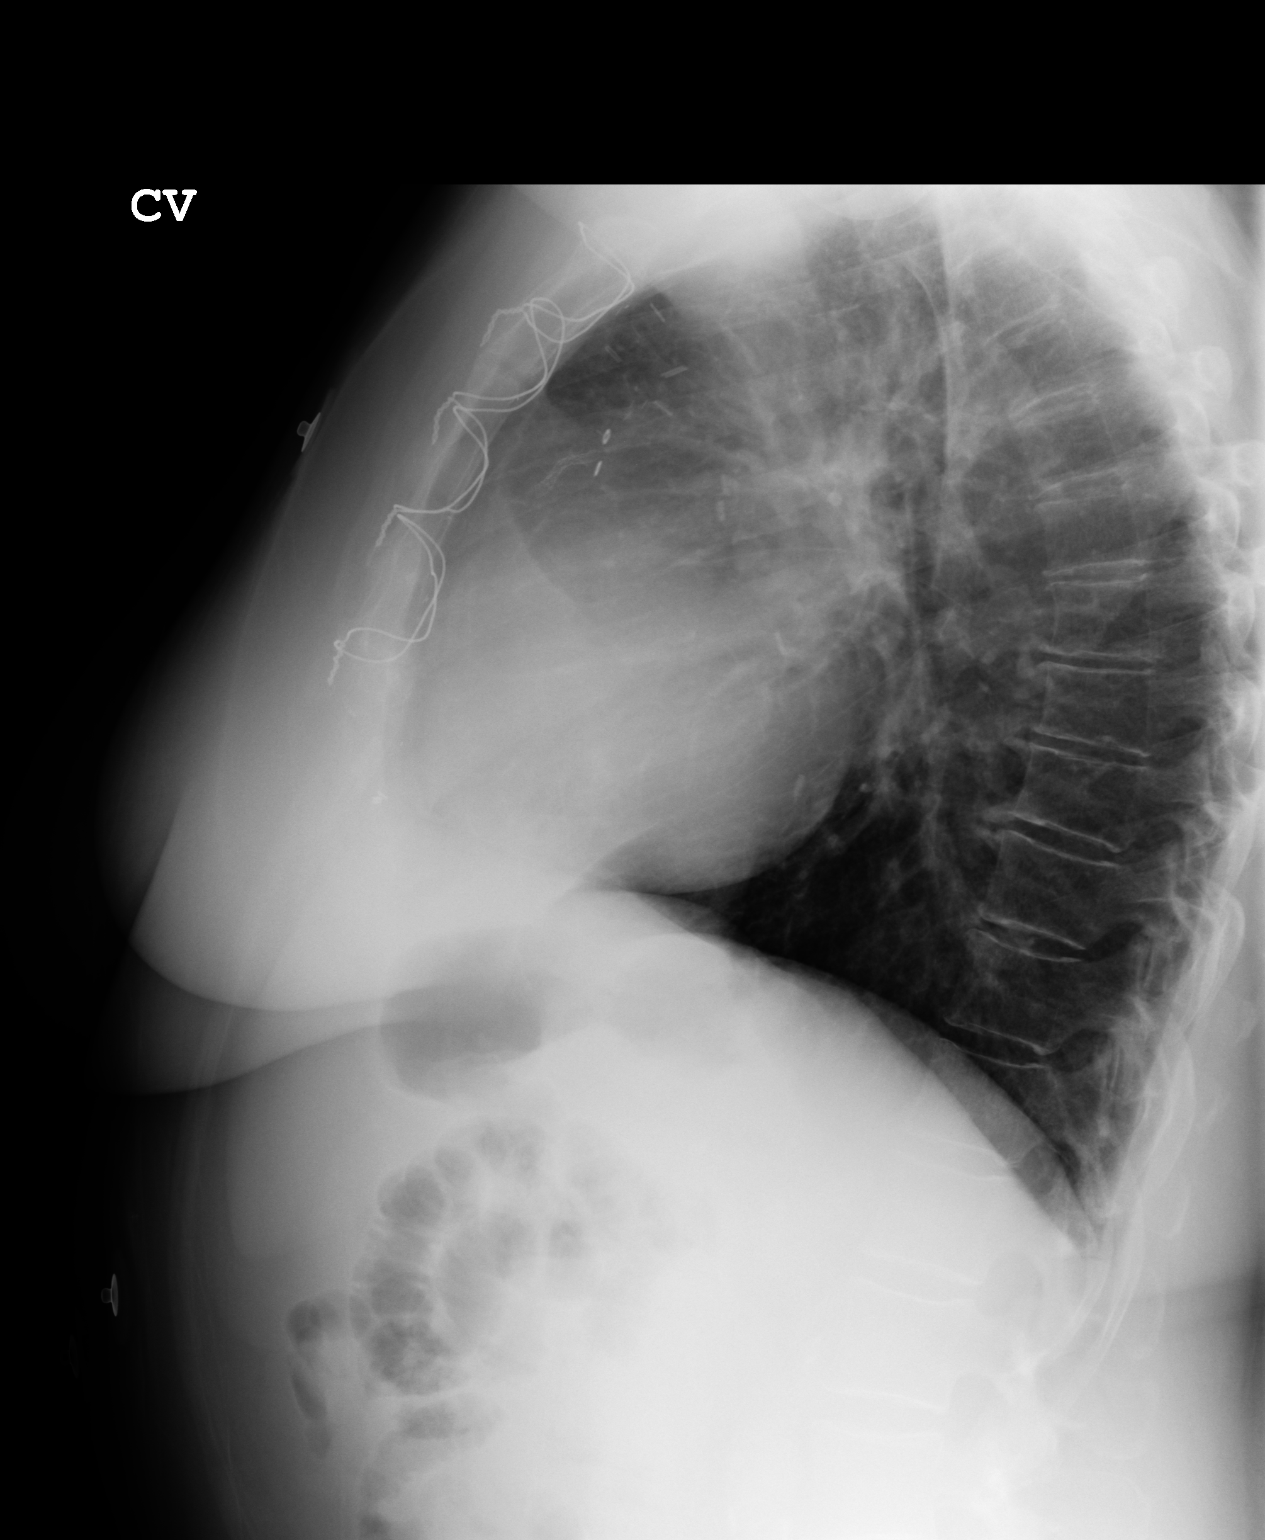

[2 of 2 positions shown; findings below may reference images not displayed]

PROCEDURE:     DXR - DXR CHEST PA (OR AP) AND LATERAL  - July 14, 2007  [DATE]

RESULT:     Comparison is made to the prior exam 04/24/2007. The lung fields
are clear. No pneumonia, pneumothorax or pleural effusion is seen.  The
heart is mildly enlarged but stable as compared to the prior exam.
Postoperative changes of prior CABG are noted.
IMPRESSION: No acute changes are identified.

## 2009-10-18 ENCOUNTER — Emergency Department: Payer: Self-pay | Admitting: Emergency Medicine

## 2009-12-22 ENCOUNTER — Emergency Department: Payer: Self-pay | Admitting: Emergency Medicine

## 2010-01-27 ENCOUNTER — Emergency Department: Payer: Self-pay | Admitting: Emergency Medicine

## 2010-01-29 ENCOUNTER — Inpatient Hospital Stay: Payer: Self-pay | Admitting: Internal Medicine

## 2010-01-31 IMAGING — CR DG CHEST 1V PORT
1 series · 1 of 1 positions shown · non-contrast
Comparison: none

REASON FOR EXAM: Chest Pain
COMMENTS:

PROCEDURE:     DXR - DXR PORTABLE CHEST SINGLE VIEW  - November 19, 2007  [DATE]
RESULT:     Comparison: 07/14/2007

[view not recorded]
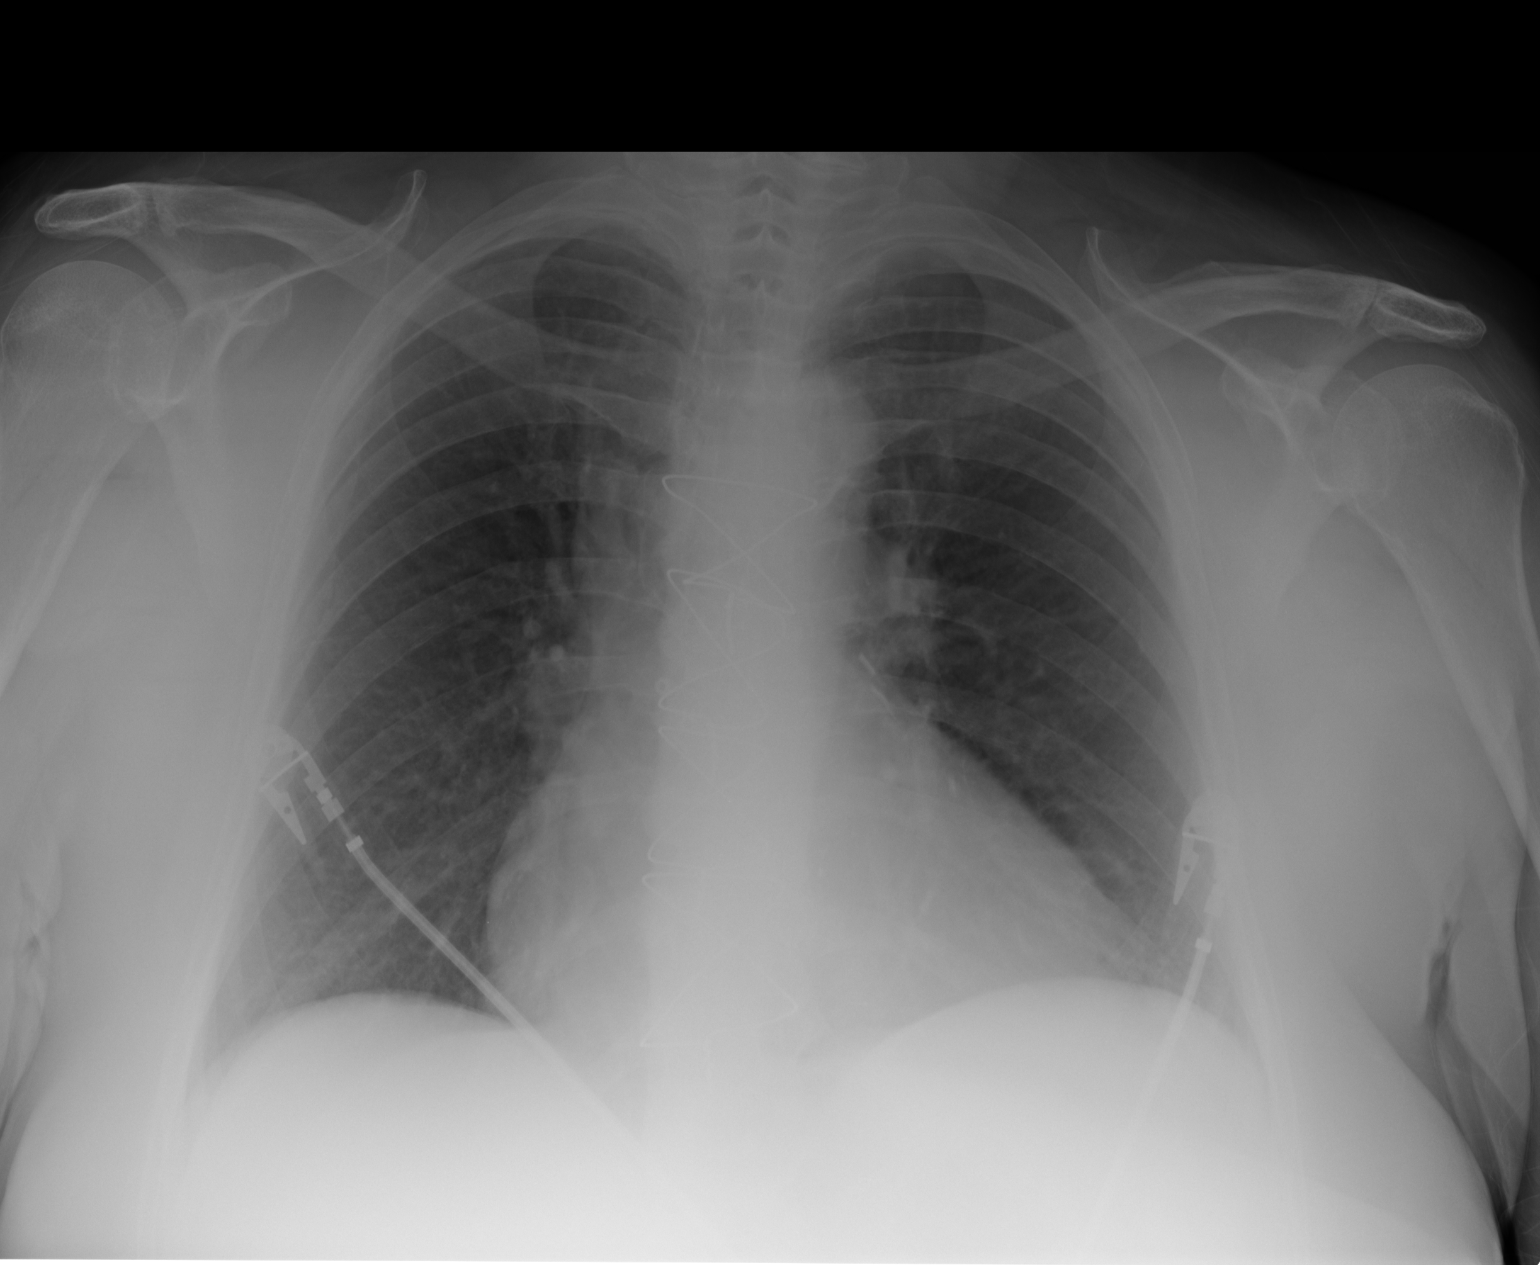

[1 of 1 positions shown; findings below may reference images not displayed]

FINDINGS: Single portable AP chest radiograph is provided. There is no focal
parenchymal opacity, pleural effusion, or pneumothorax. Stable cardiomegaly.
Prior median sternotomy. The osseous structures are unremarkable.
IMPRESSION: No acute disease of the chest.

## 2010-02-08 ENCOUNTER — Inpatient Hospital Stay: Payer: Self-pay | Admitting: Internal Medicine

## 2010-02-23 IMAGING — CT CT STONE STUDY
1 of 2 series · 15 of 32 positions shown, 19 images · non-contrast
Comparison: 04/24/2007

REASON FOR EXAM: r flank pain
COMMENTS:

PROCEDURE:     CT  - CT ABDOMEN /PELVIS WO (STONE)  - December 12, 2007  [DATE]
RESULT:     Indication: Renal stone.
TECHNIQUE: A standard renal stone protocol was performed with sequential 5
mm noncontrasted axial images from the lung bases to the symphysis pubis
with the patient in a supine position.

[Series 2: stone · axial · 0.68mm/px · z∈[-1060,-666]mm · 15 of 143 slices shown, 19 images]
[im 6/143  soft-tissue]
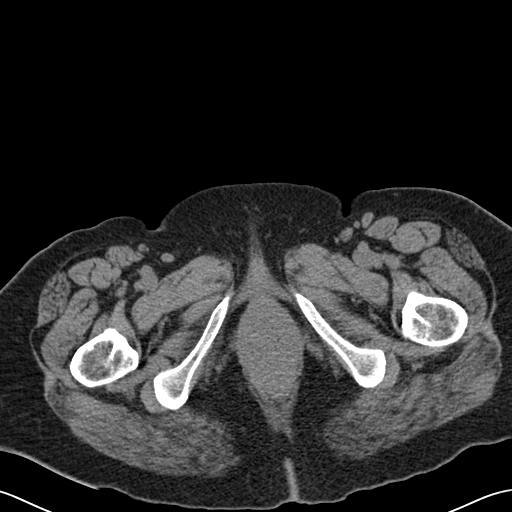
[im 6/143  bone]
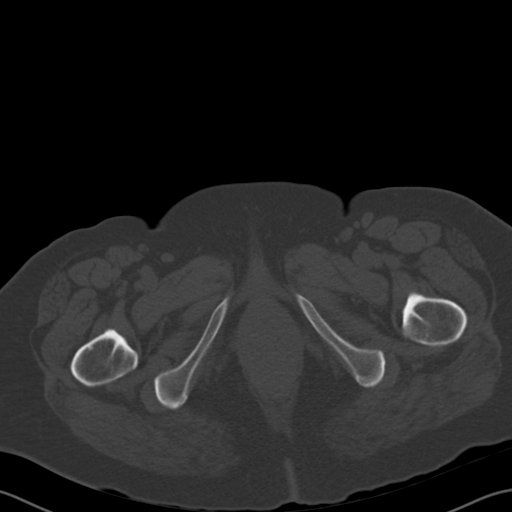
[im 18/143  soft-tissue]
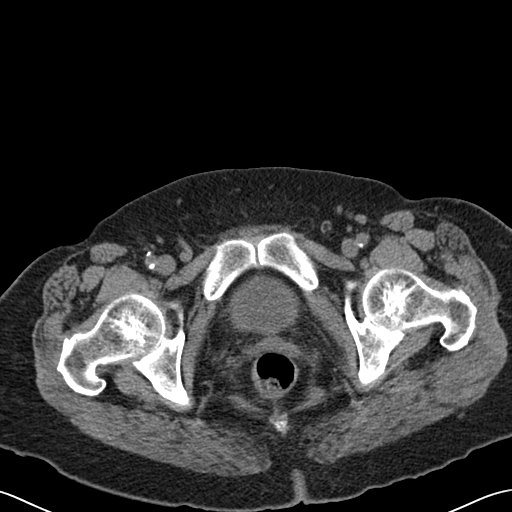
[im 30/143  soft-tissue]
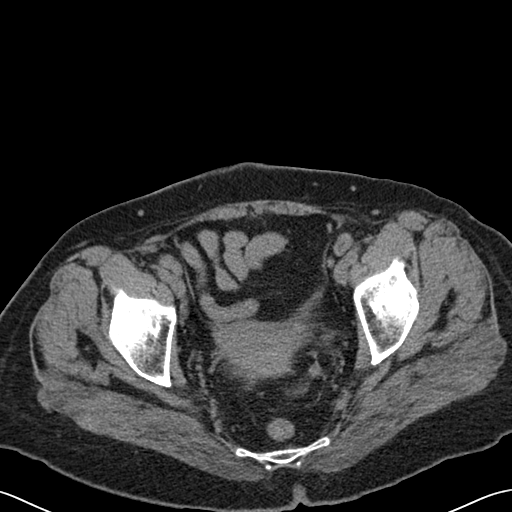
[im 42/143  soft-tissue]
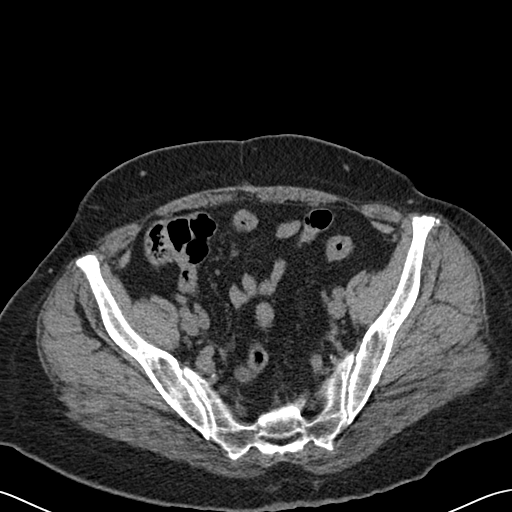
[im 48/143  soft-tissue]
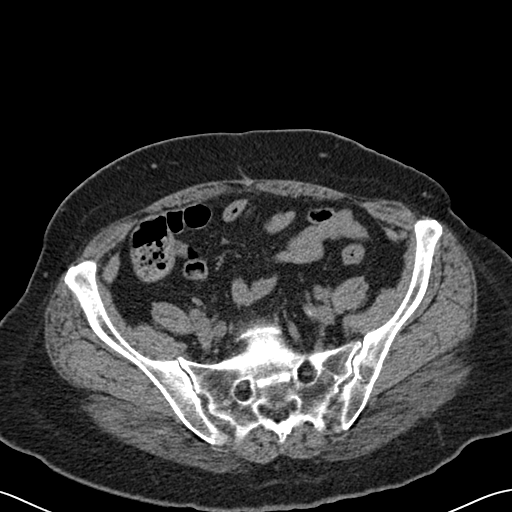
[im 60/143  soft-tissue]
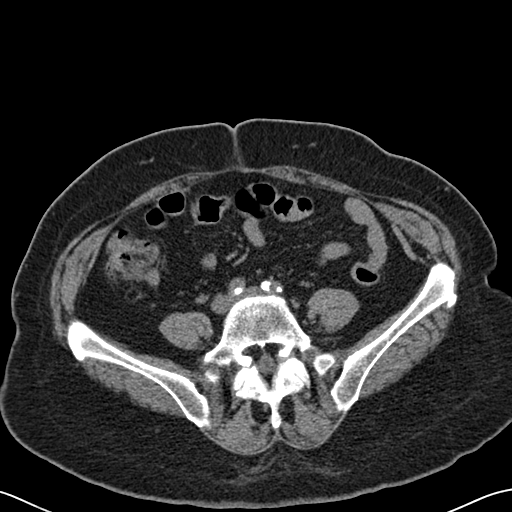
[im 72/143  soft-tissue]
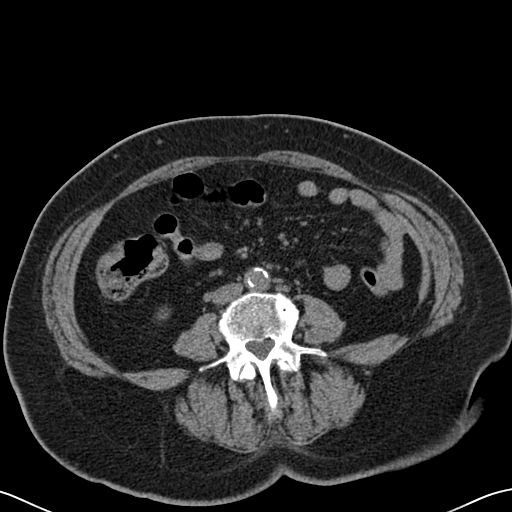
[im 83/143  soft-tissue]
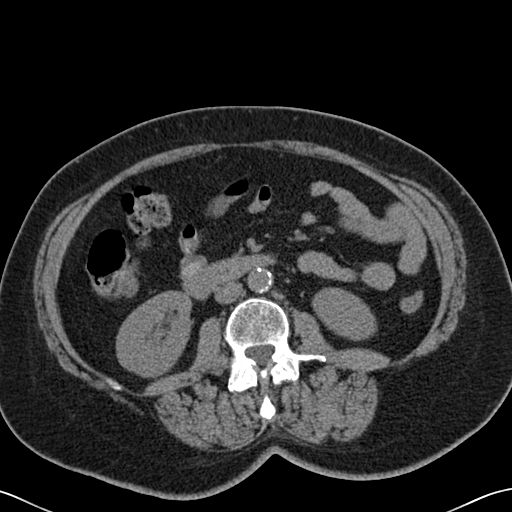
[im 95/143  soft-tissue]
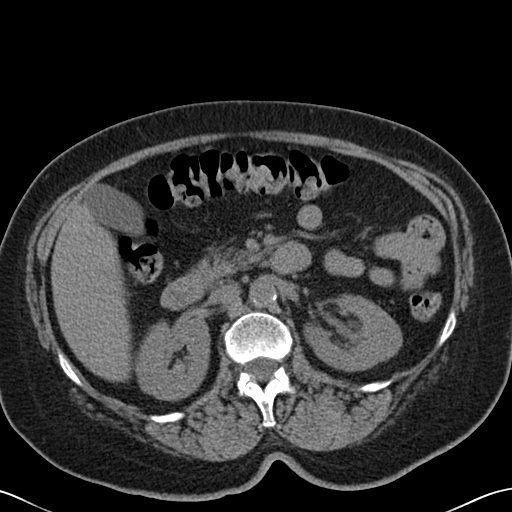
[im 95/143  bone]
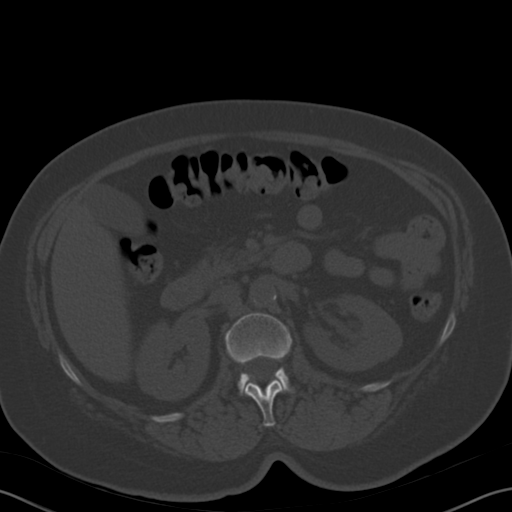
[im 101/143  soft-tissue]
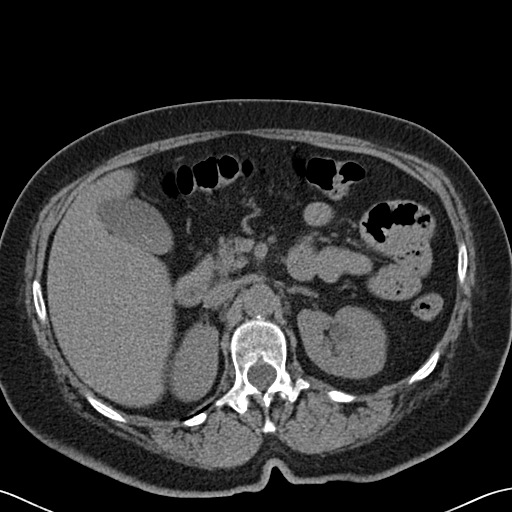
[im 113/143  soft-tissue]
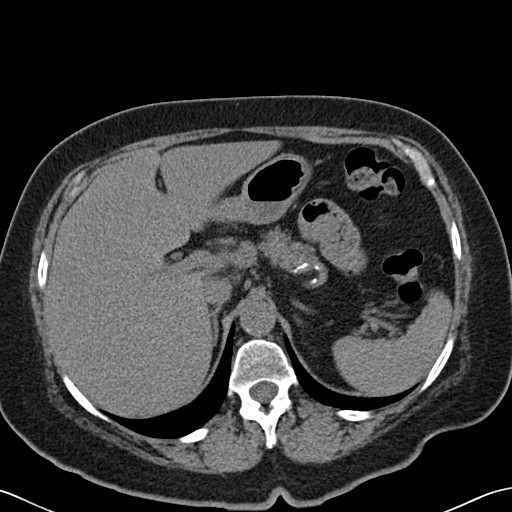
[im 119/143  lung]
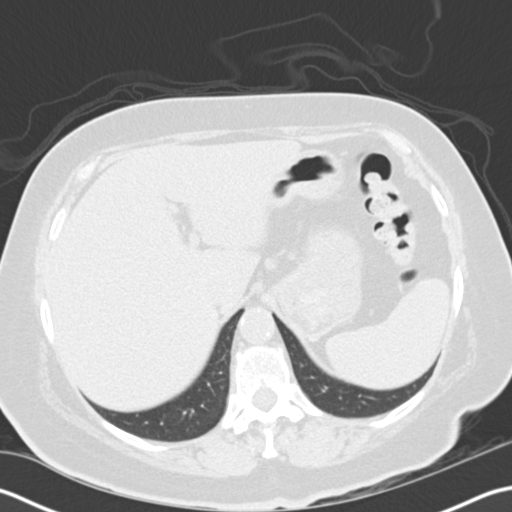
[im 125/143  soft-tissue]
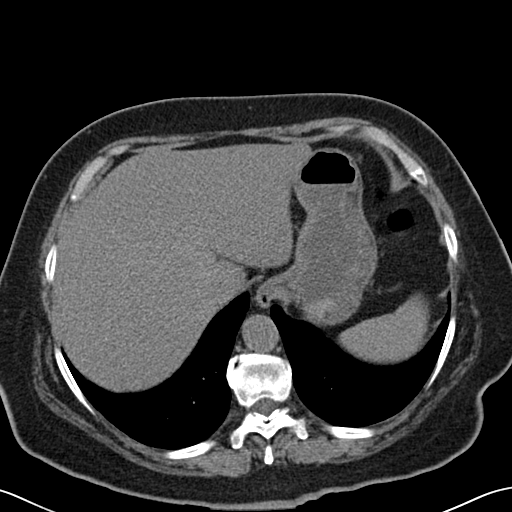
[im 125/143  lung]
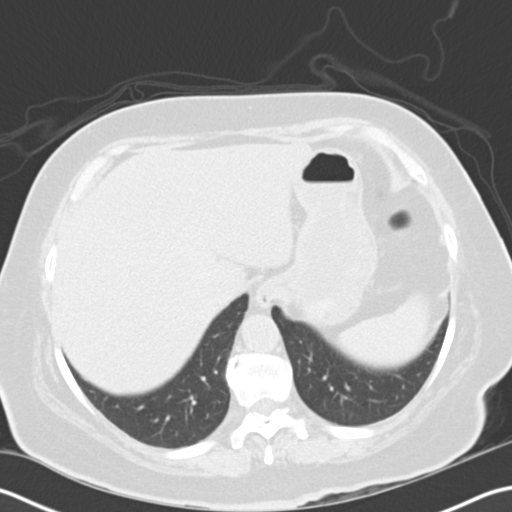
[im 131/143  lung]
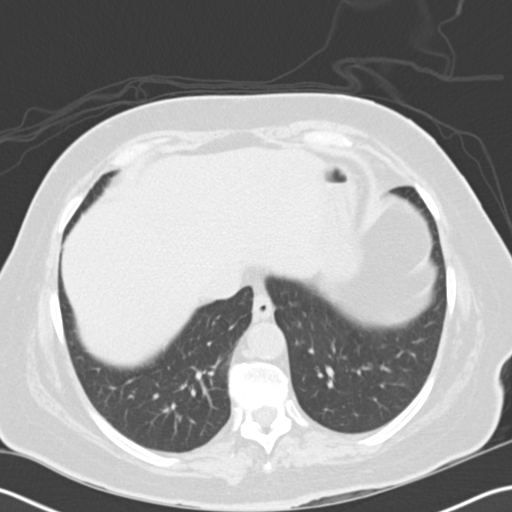
[im 137/143  soft-tissue]
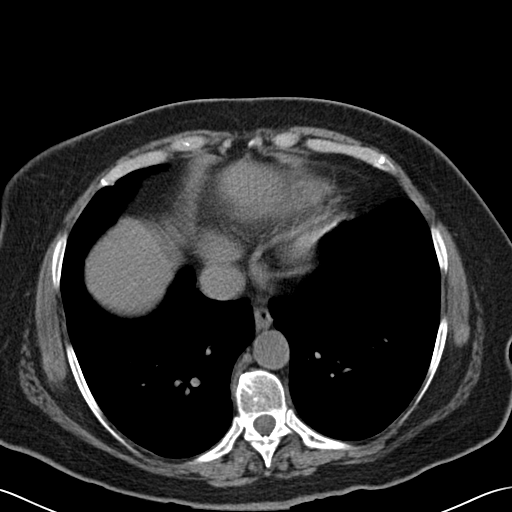
[im 137/143  lung]
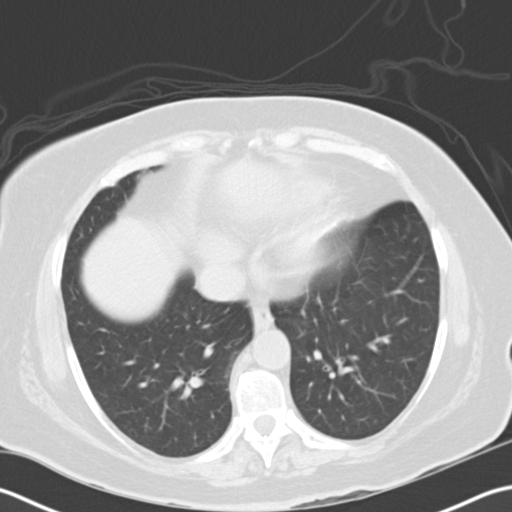

[15 of 32 positions shown; findings below may reference images not displayed]

FINDINGS: Limited views of the lung bases fail to demonstrate abnormal parenchymal
nodules or pleural or pericardial effusions.

No renal, ureteral, or bladder calculi. No obstructive uropathy.
There are no secondary signs of obstruction. No perinephric stranding is
seen. There is a tiny 2 mm small cortical bulge of the left posterior left
lower pole which is too small to characterize. The kidneys are otherwise
symmetric in size without evidence for exophytic mass.

The liver is diffusely of low attenuation most consistent with hepatic
steatosis. The gallbladder is unremarkable. The spleen demonstrates no focal
abnormality. The adrenal glands and pancreas are normal.

The unopacified stomach, duodenum, small intestine, and large intestine are
unremarkable, but evaluation is limited by lack of oral contrast. There is
sigmoid diverticulosis without evidence of diverticulitis. There is no
pneumoperitoneum, pneumatosis, or portal venous gas. There is no abdominal
or pelvic free fluid. There is no lymphadenopathy. There is a hyperdense
nodule measuring 8mm in the right labia likely representing a labial cyst,
unchanged in the prior exam. This likely represents a Bartholin's gland cyst.

The abdominal aorta is normal in caliber with atherosclerosis.

There is bilateral L5 spondylolysis.
IMPRESSION: 1. No urolithiasis or obstructive uropathy.

2. Sigmoid diverticulosis without evidence of diverticulitis.

3. Hepatic steatosis.

4. There is a hyperdense nodule measuring 8mm in the right labia likely
representing a labial cyst, unchanged in the prior exam. This likely
represents a Bartholin's gland cyst.

## 2010-02-23 IMAGING — CR DG CHEST 2V
1 series · 2 of 2 positions shown · non-contrast
Comparison: none

REASON FOR EXAM: intermittent sob
COMMENTS:

PROCEDURE:     DXR - DXR CHEST PA (OR AP) AND LATERAL  - December 12, 2007  [DATE]
RESULT:     Comparison: 11/19/2007

[Series 1: view not recorded · 0.17mm/px · 2 of 2 slices shown]
[im 1/2]
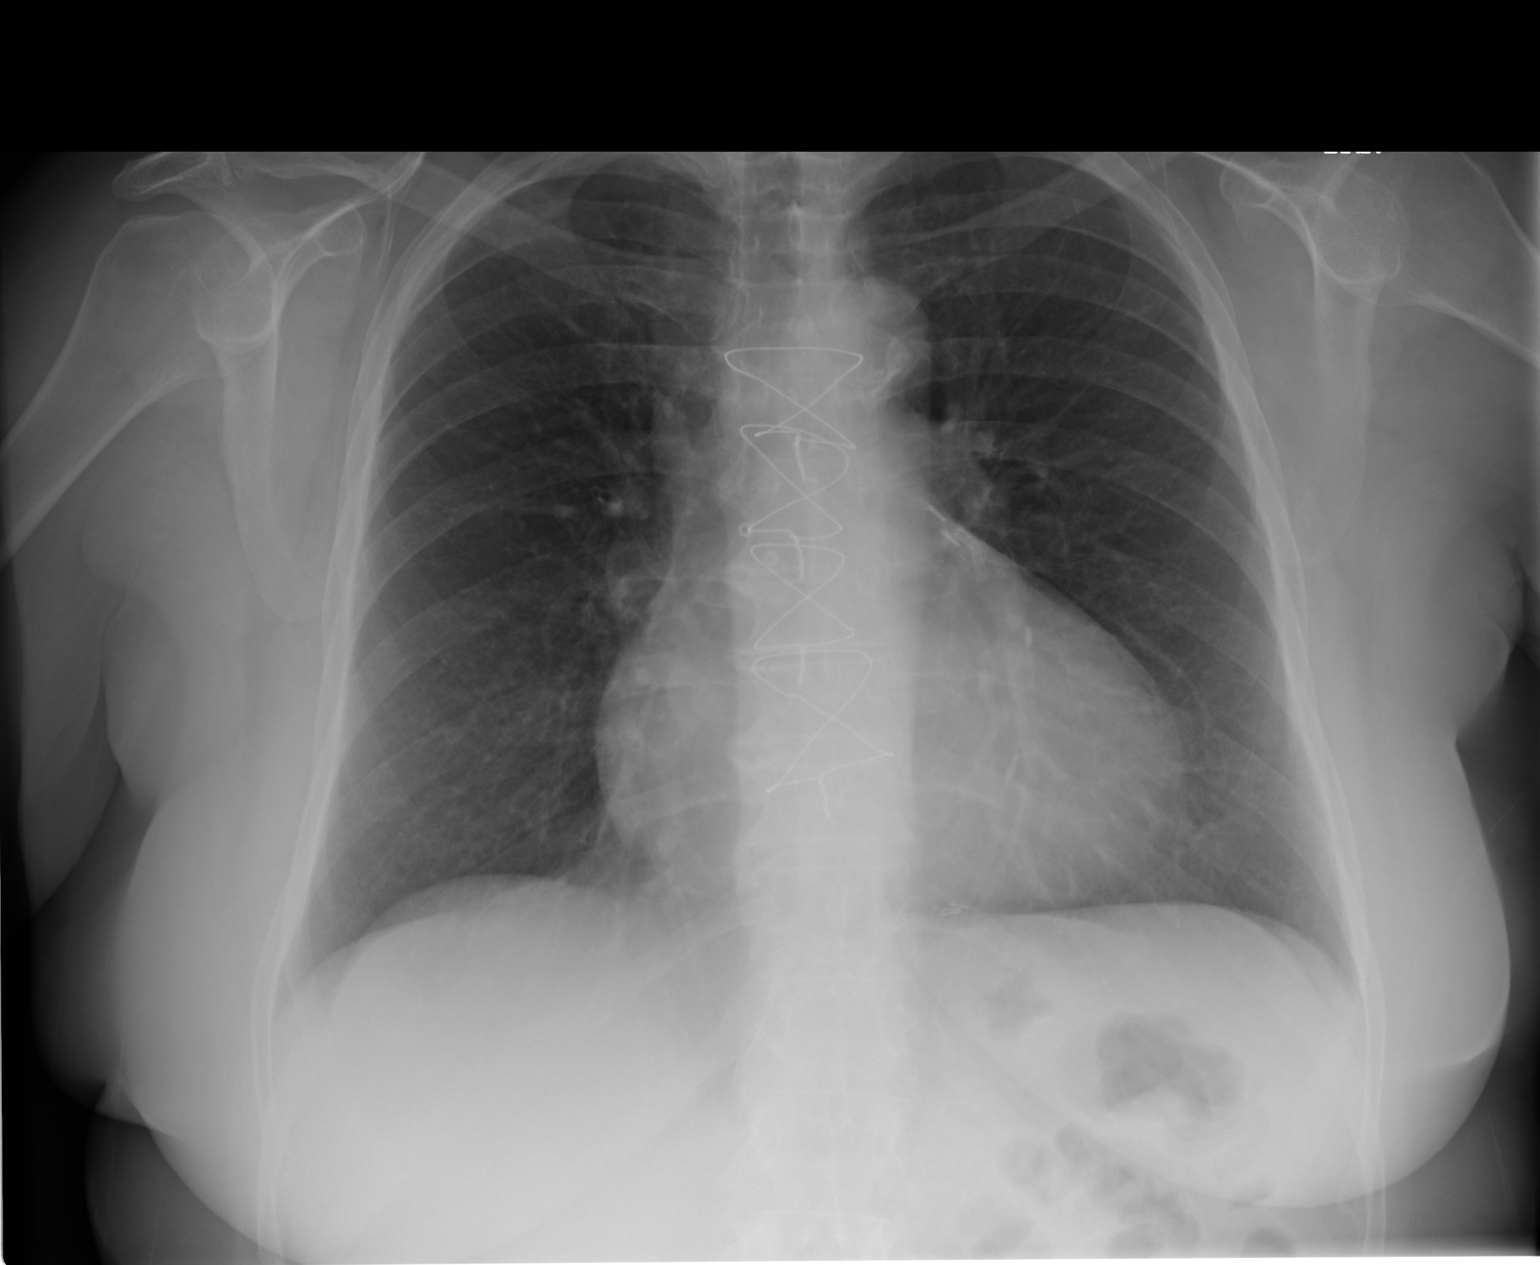
[im 2/2]
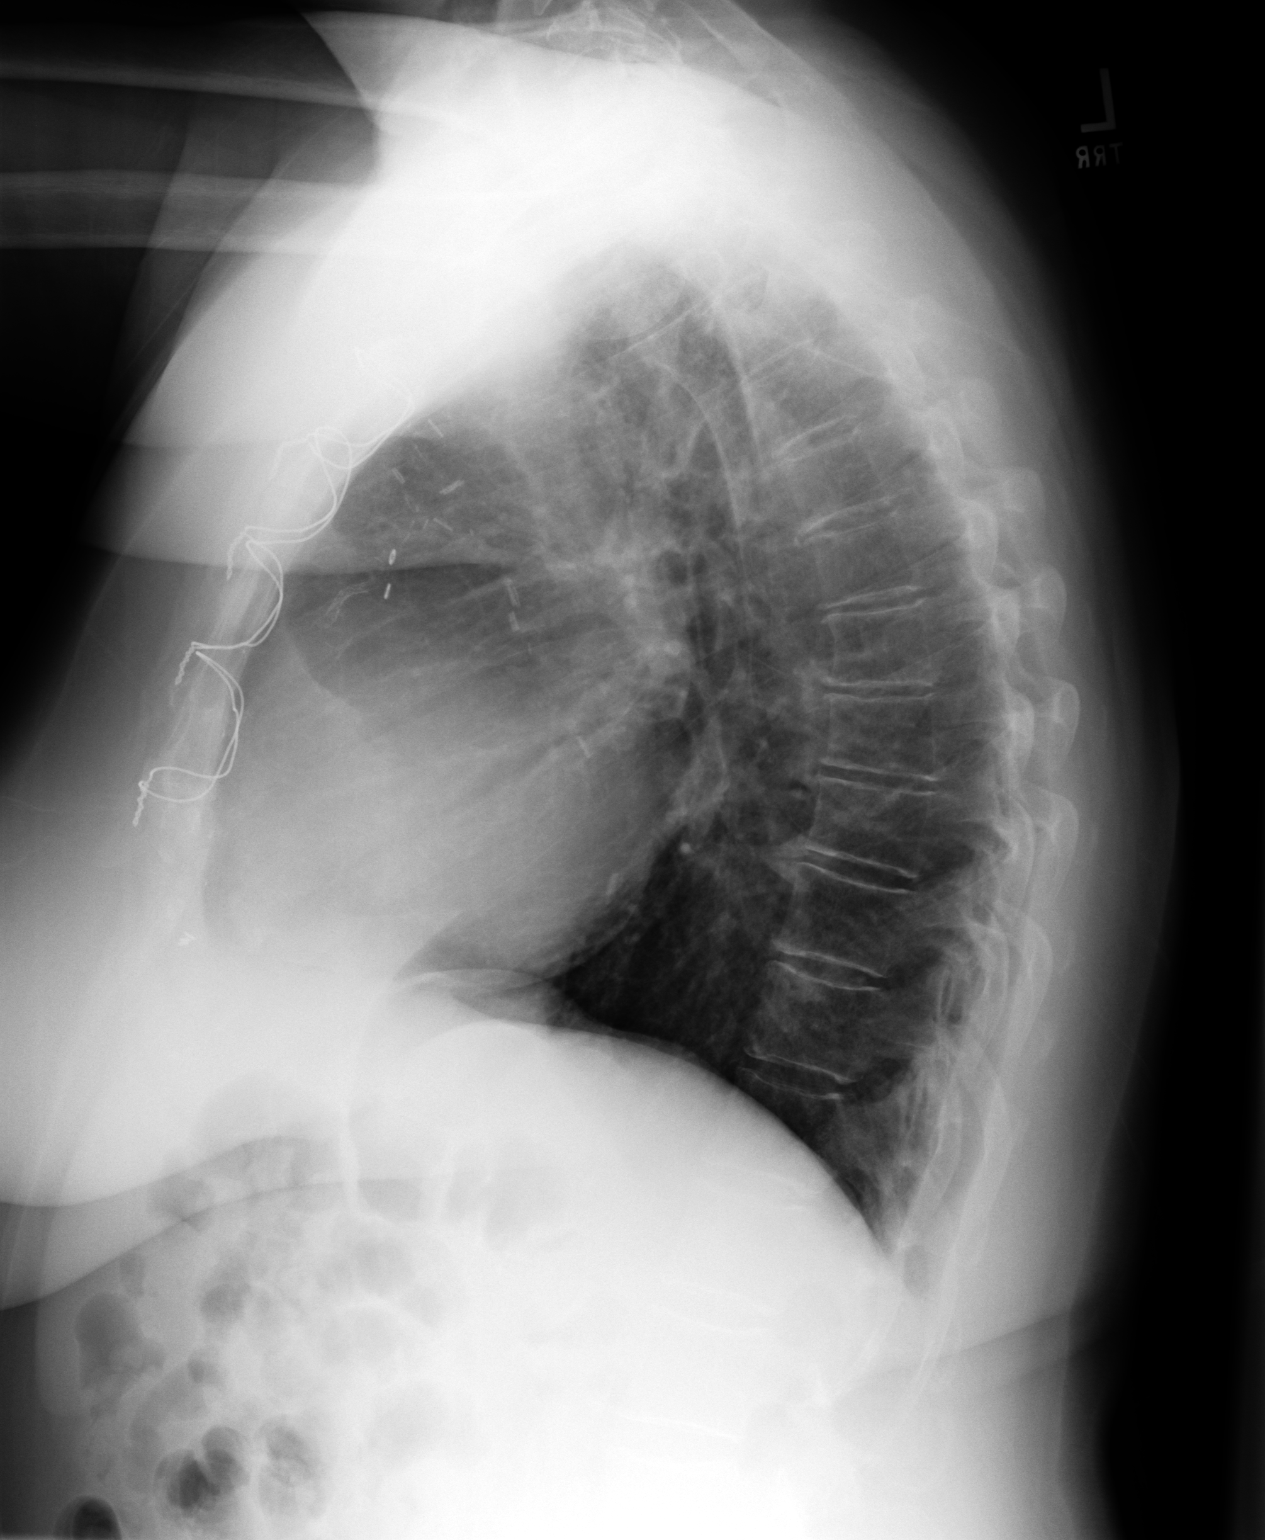

[2 of 2 positions shown; findings below may reference images not displayed]

FINDINGS: PA and lateral chest radiographs are provided. There is no focal parenchymal
opacity, pleural effusion, or pneumothorax. There is stable cardiomegaly.
There is evidence of prior median sternotomy. The osseous structures are
unremarkable.
IMPRESSION: No acute disease of the chest.

## 2010-02-27 ENCOUNTER — Emergency Department: Payer: Self-pay | Admitting: Emergency Medicine

## 2010-04-27 ENCOUNTER — Inpatient Hospital Stay: Payer: Self-pay | Admitting: Specialist

## 2010-06-03 IMAGING — CT CT HEAD WITHOUT CONTRAST
2 series · 16 of 30 positions shown, 20 images · non-contrast
Comparison: none

REASON FOR EXAM: VERTIGO
COMMENTS:

PROCEDURE:     CT  - CT HEAD WITHOUT CONTRAST  - March 21, 2008 [DATE]
RESULT:     Comparison: 02/28/2008
TECHNIQUE: Multiple axial images from the foramen magnum to the vertex were
obtained without IV contrast.

[Series 2: without · axial · non-contrast · 0.39mm/px · z∈[-155,-30]mm · 13 of 31 slices shown, 17 images]
[im 3/31  brain]
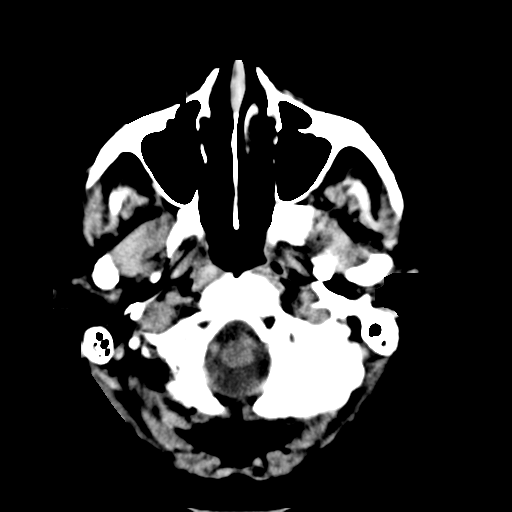
[im 3/31  bone]
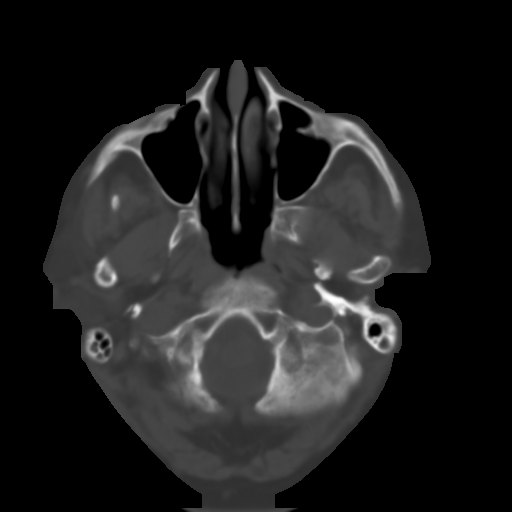
[im 5/31  brain]
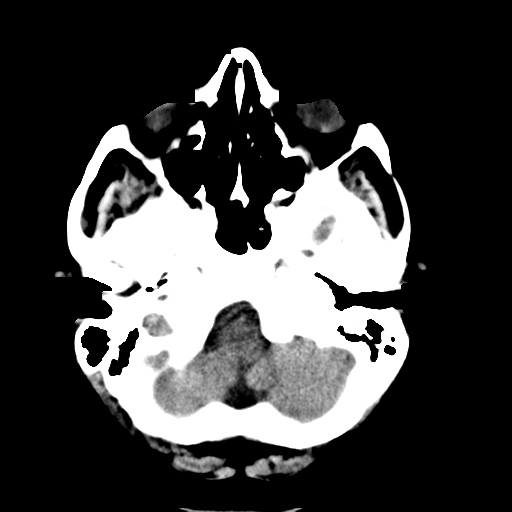
[im 7/31  brain]
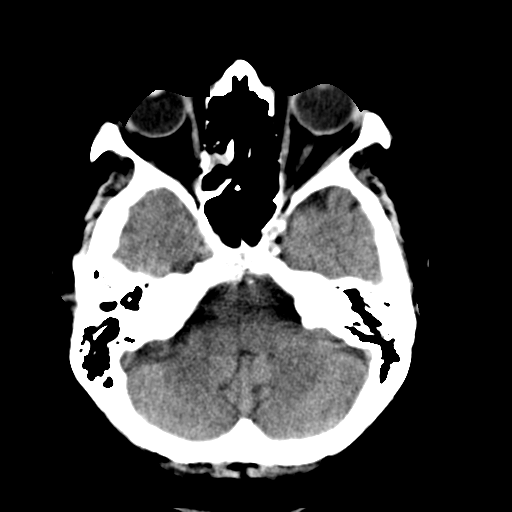
[im 9/31  brain]
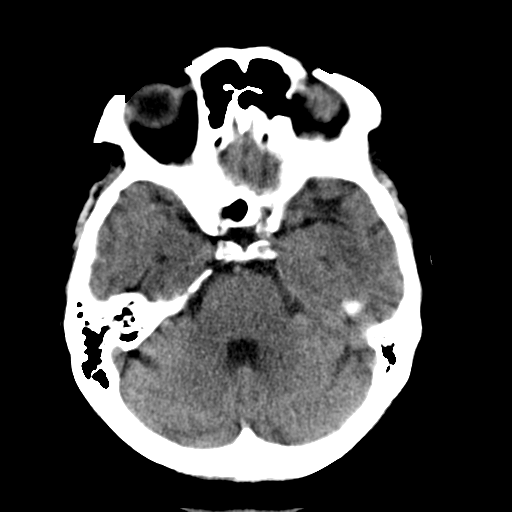
[im 11/31  brain]
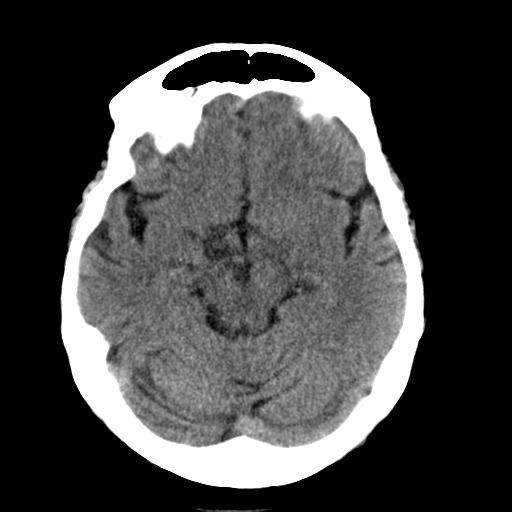
[im 11/31  bone]
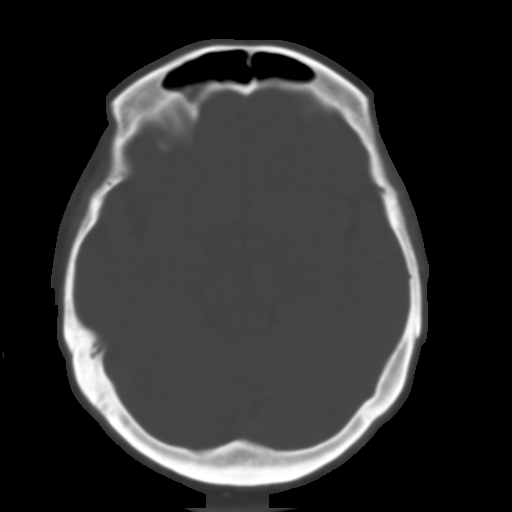
[im 13/31  brain]
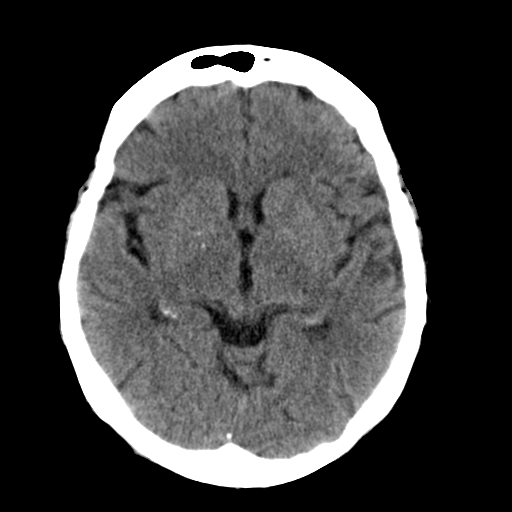
[im 16/31  brain]
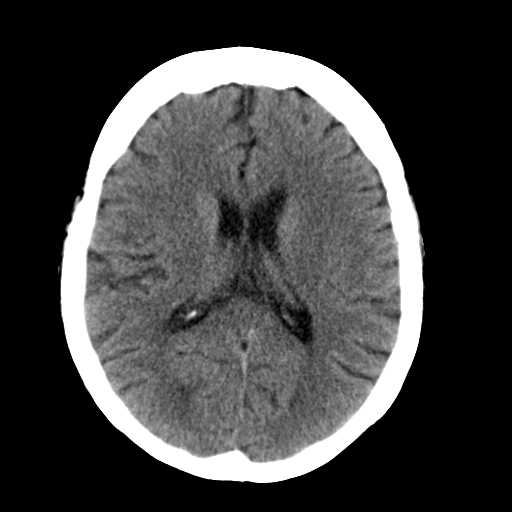
[im 18/31  brain]
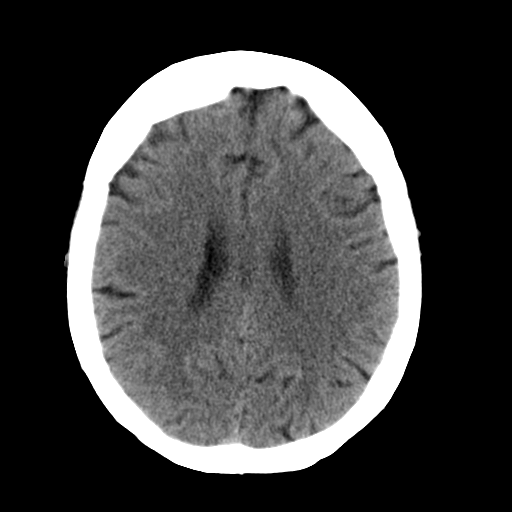
[im 20/31  brain]
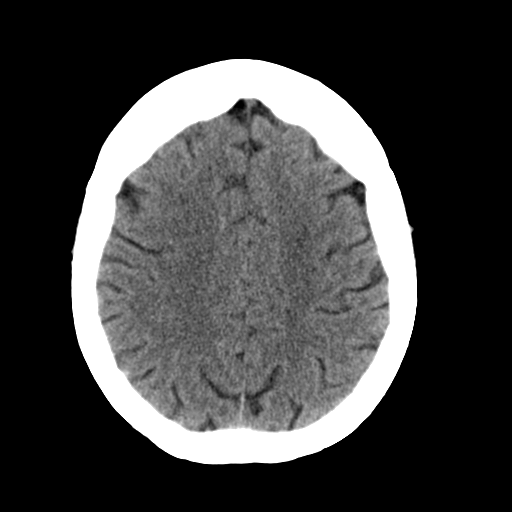
[im 20/31  bone]
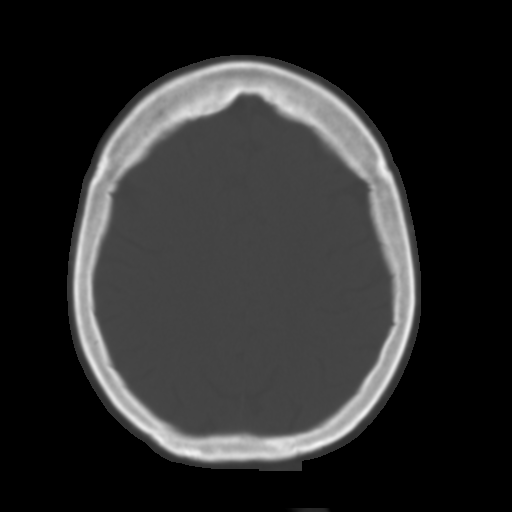
[im 22/31  brain]
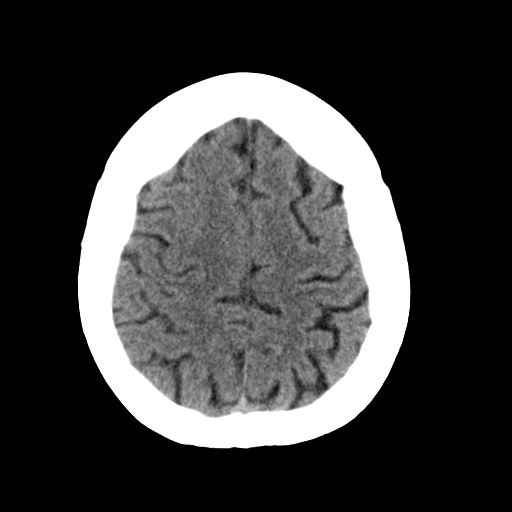
[im 24/31  brain]
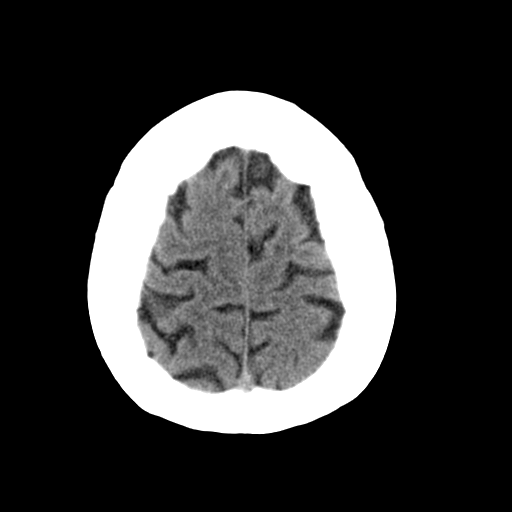
[im 26/31  brain]
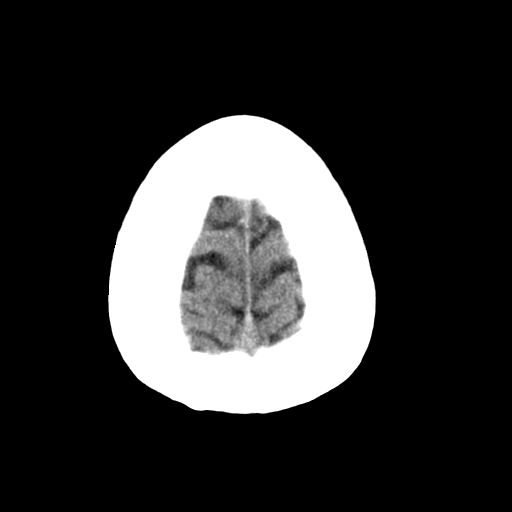
[im 28/31  brain]
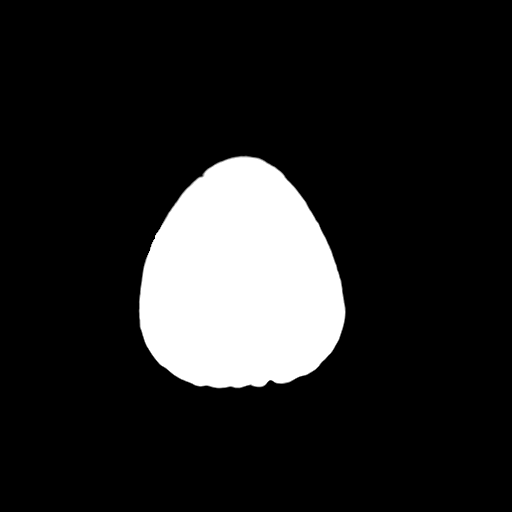
[im 28/31  bone]
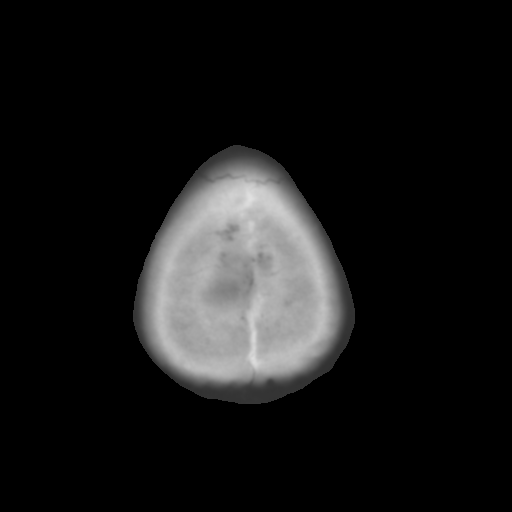

[Series 3: bone · axial · 0.39mm/px · z∈[-155,-115]mm · 3 of 31 slices shown]
[im 3/31  bone]
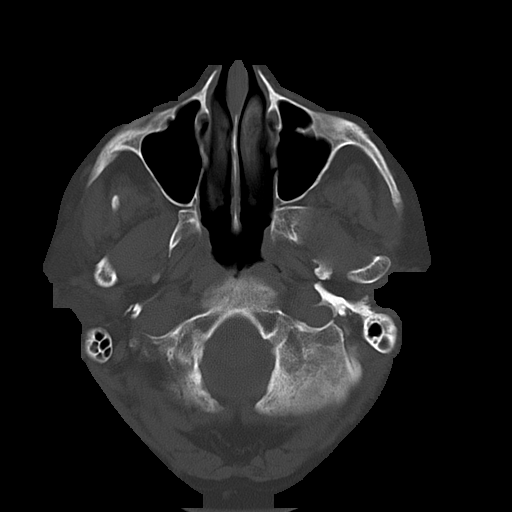
[im 7/31  bone]
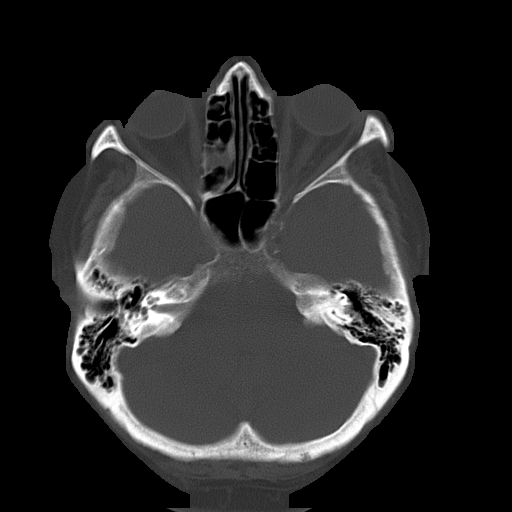
[im 11/31  bone]
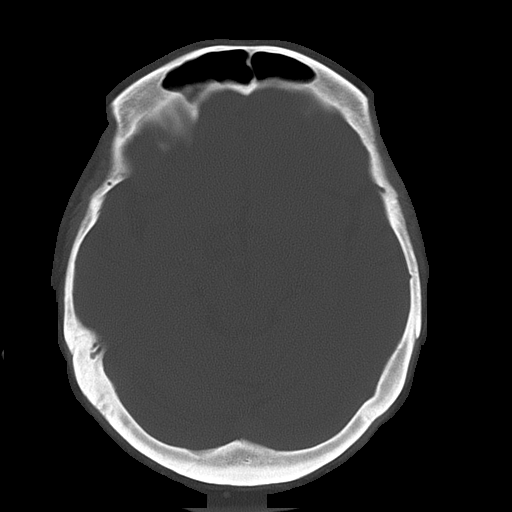

[16 of 30 positions shown; findings below may reference images not displayed]

FINDINGS: There is no evidence for mass effect, midline shift, or extra-axial fluid
collections.  There is no evidence for space-occupying lesion or
intracranial hemorrhage. There is no evidence for a cortical-based area of
acute infarction.

Ventricles and sulci are appropriate for the patient's age. The basal
cisterns are patent.

Visualized portions of the orbits are unremarkable. There is mucosal
thickening involving the right ethmoid sinus. There is atherosclerotic
calcification of the internal carotid arteries.

The osseous structures are unremarkable.
IMPRESSION: No acute intracranial process.

## 2010-07-15 IMAGING — CR DG CHEST 2V
1 series · 2 of 2 positions shown · non-contrast
Comparison: none

REASON FOR EXAM: chest pain
COMMENTS:

PROCEDURE:     DXR - DXR CHEST PA (OR AP) AND LATERAL  - May 02, 2008  [DATE]
RESULT:     Comparison is made to the study of 12/12/2007. Sternotomy wires
are present. The lungs are clear. There is no effusion or pneumothorax.
Degenerative changes are seen in the spine. The appearance is stable.

[Series 1: view not recorded · 0.17mm/px · 2 of 2 slices shown]
[im 1/2]
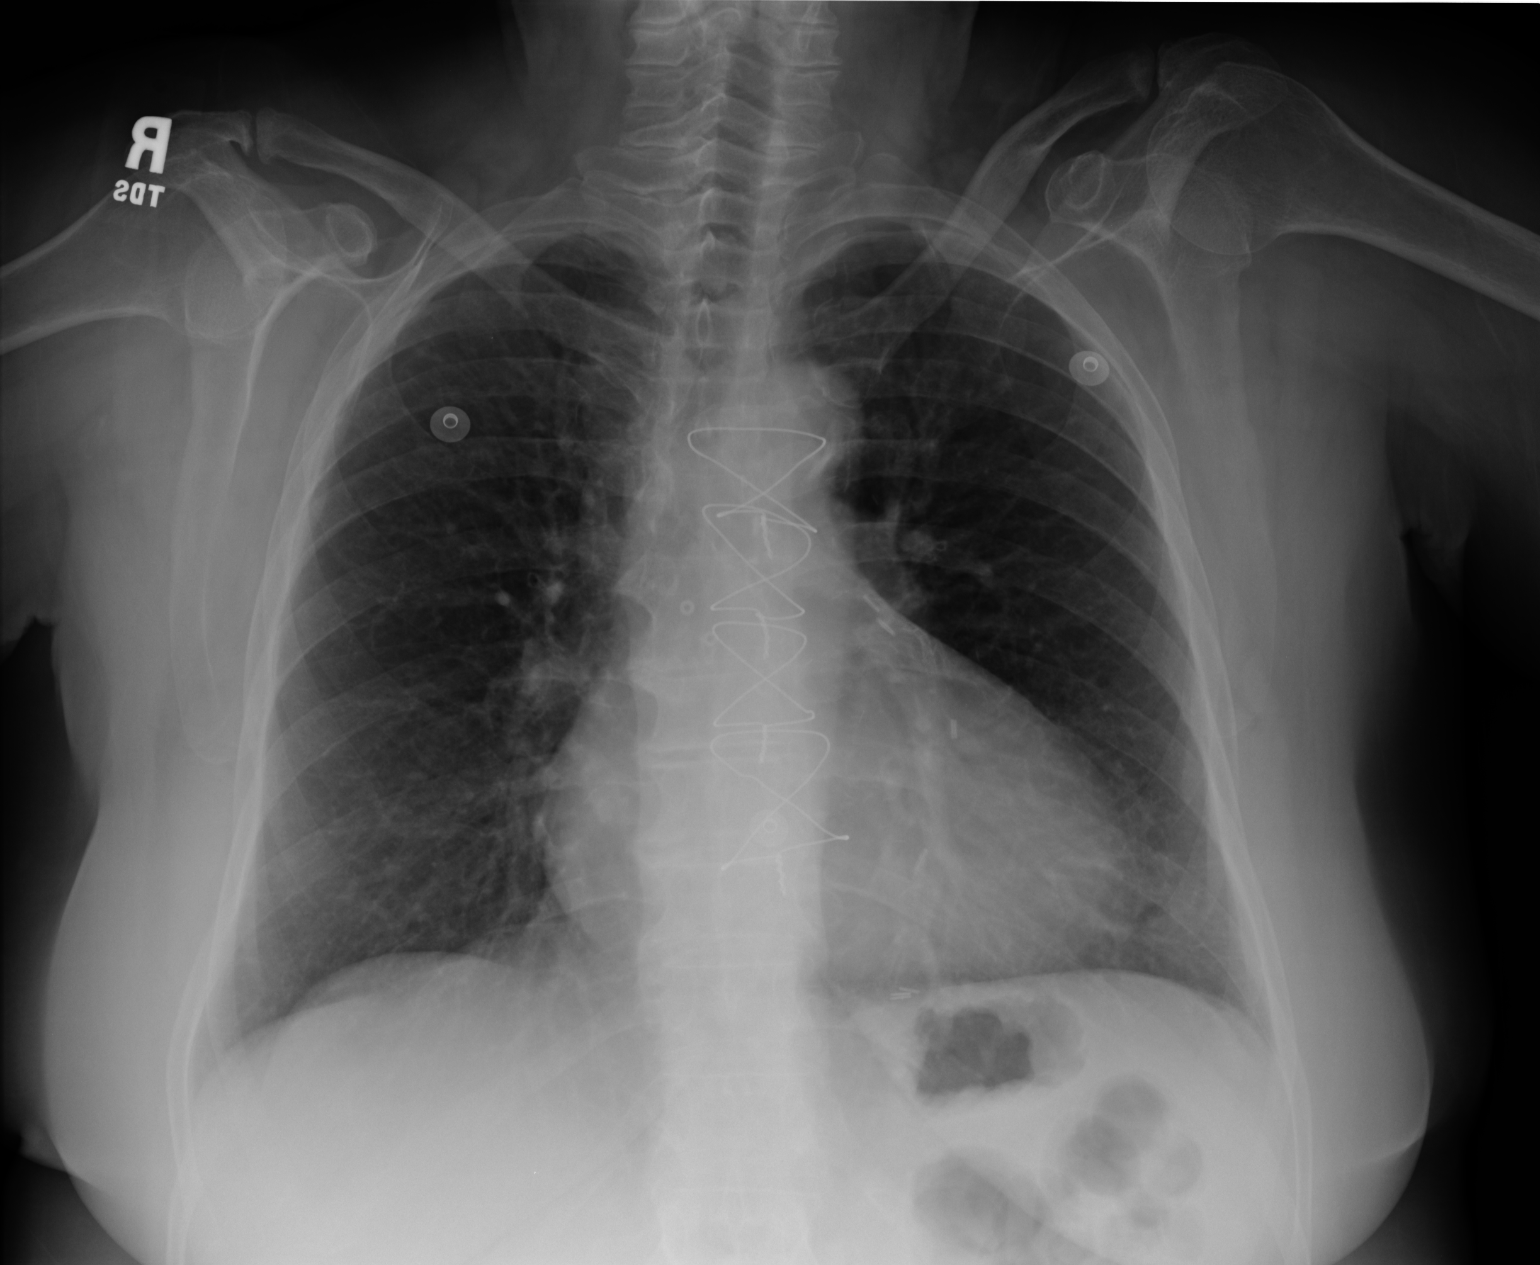
[im 2/2]
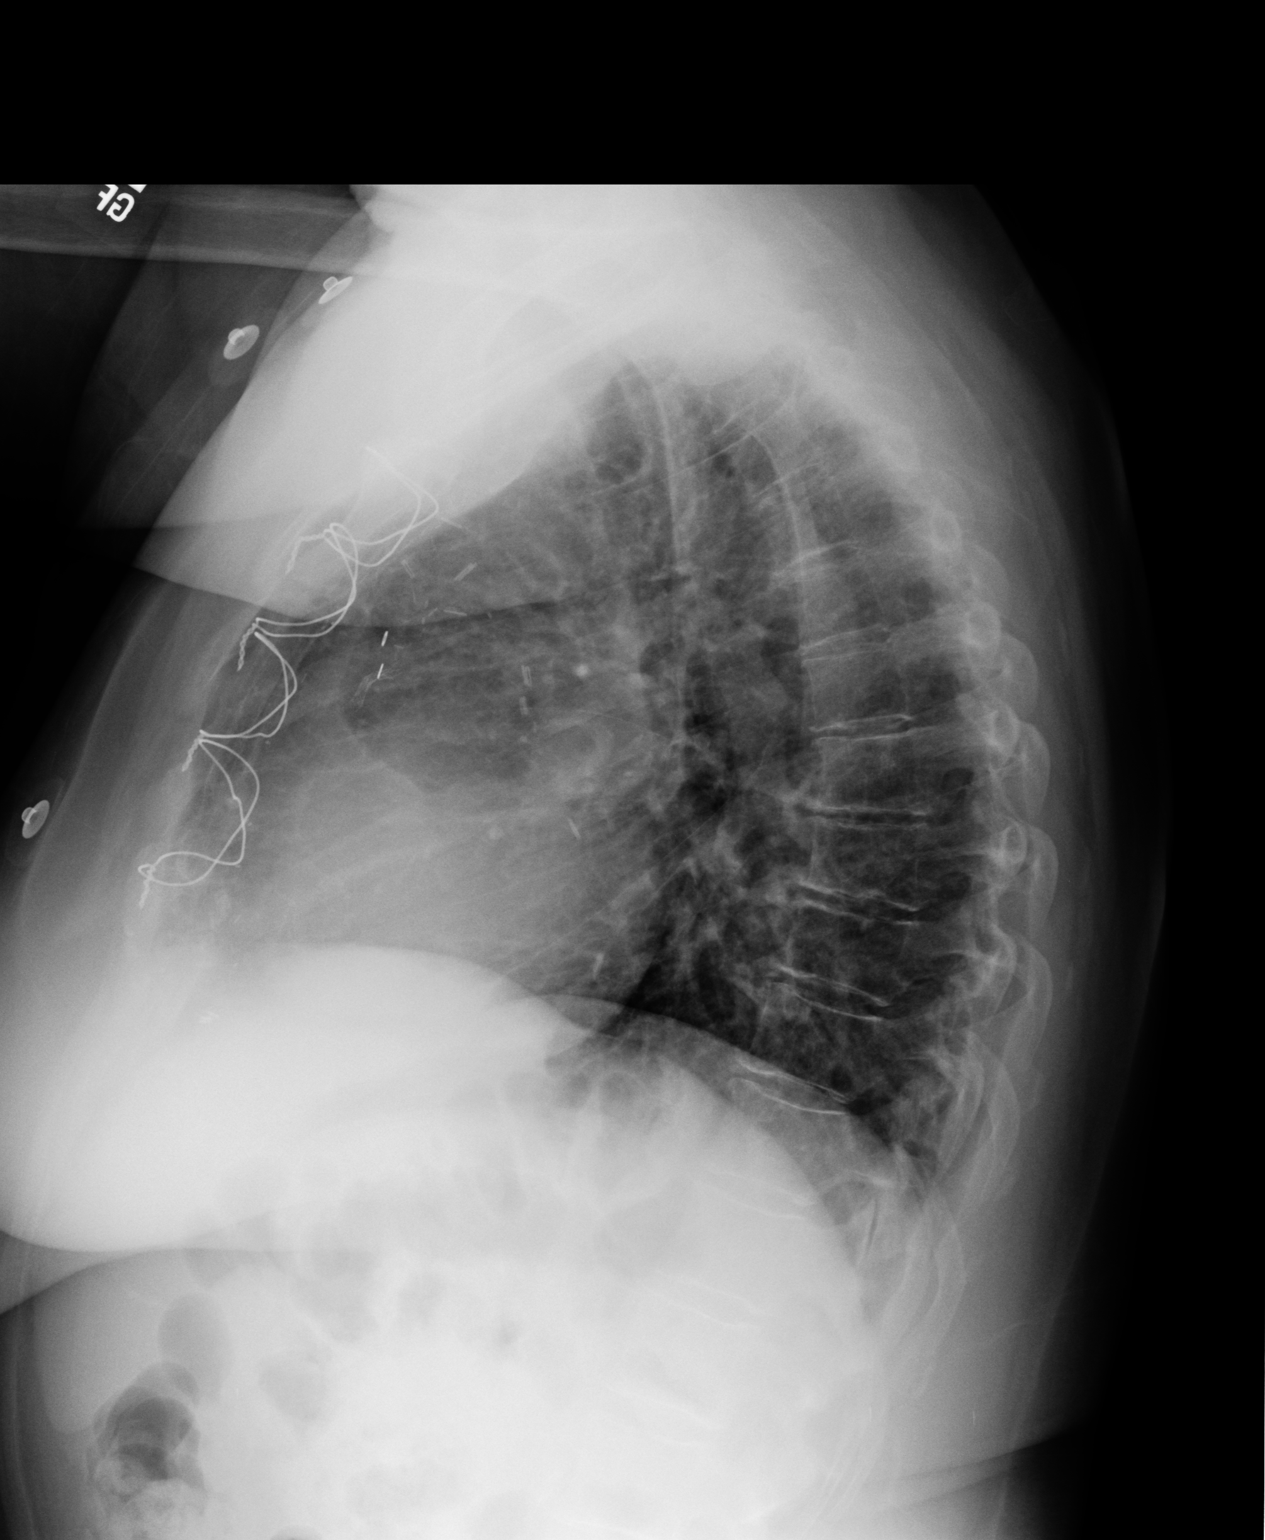

[2 of 2 positions shown; findings below may reference images not displayed]

IMPRESSION: 1.     No acute cardiopulmonary disease.
2.     The cardiac silhouette is mildly prominent.
3.     Sternotomy wires are present.

## 2010-07-31 ENCOUNTER — Emergency Department: Payer: Self-pay | Admitting: Emergency Medicine

## 2010-08-05 ENCOUNTER — Emergency Department: Payer: Self-pay | Admitting: Emergency Medicine

## 2010-08-28 ENCOUNTER — Emergency Department: Payer: Self-pay | Admitting: Emergency Medicine

## 2010-09-12 IMAGING — US ABDOMEN ULTRASOUND
1 series · 17 of 25 positions shown · non-contrast
Comparison: none

REASON FOR EXAM: abd pain
COMMENTS:

[Series 1: abdomen ultrasound · 17 of 88 slices shown]
[im 1/88]
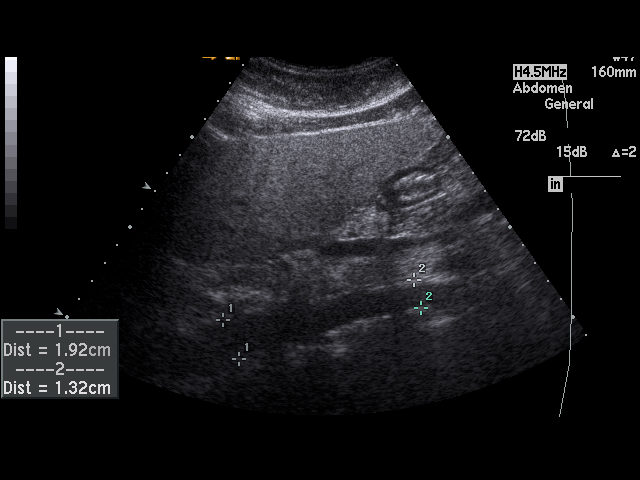
[im 8/88]
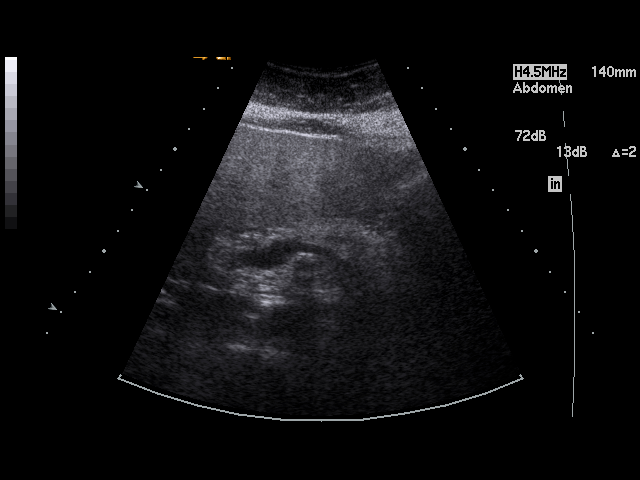
[im 11/88]
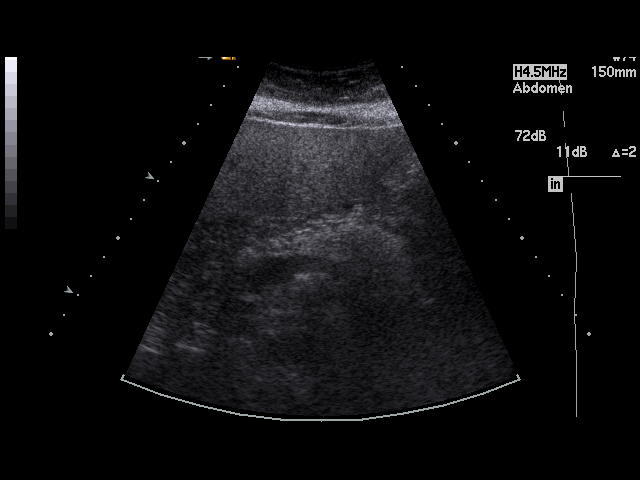
[im 19/88]
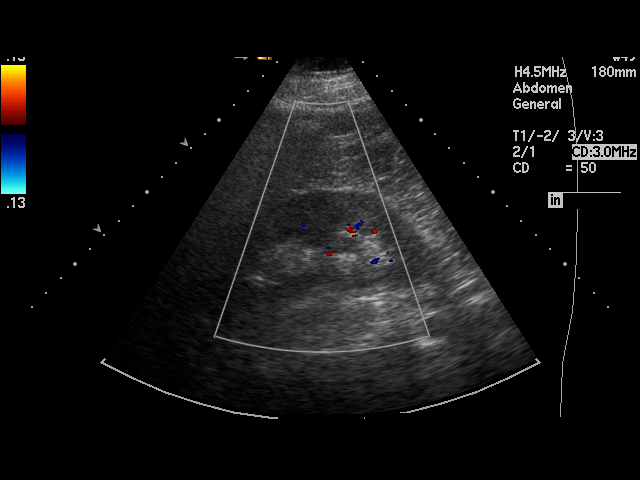
[im 22/88]
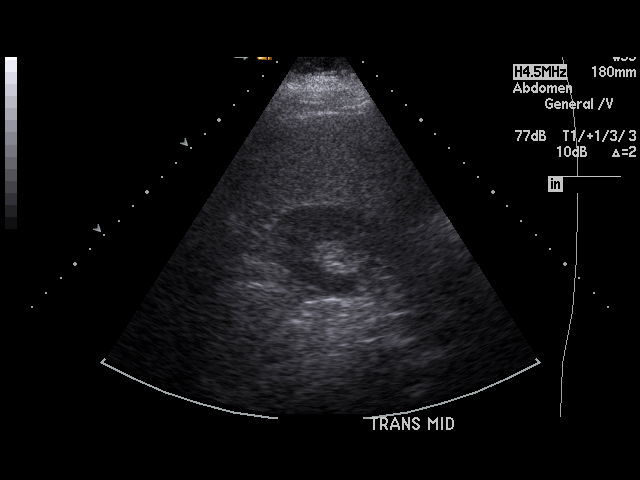
[im 30/88]
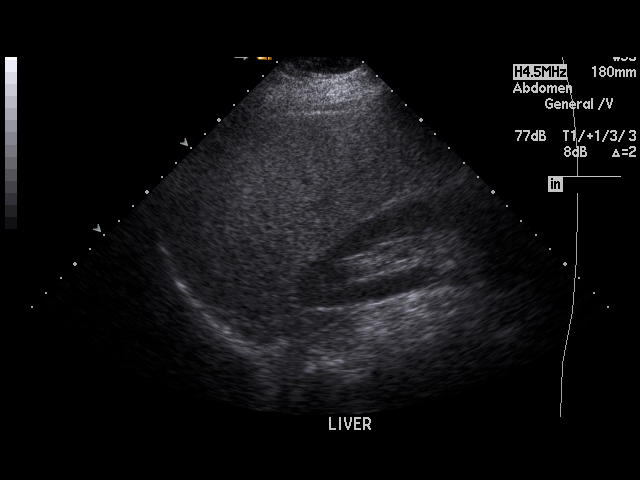
[im 33/88]
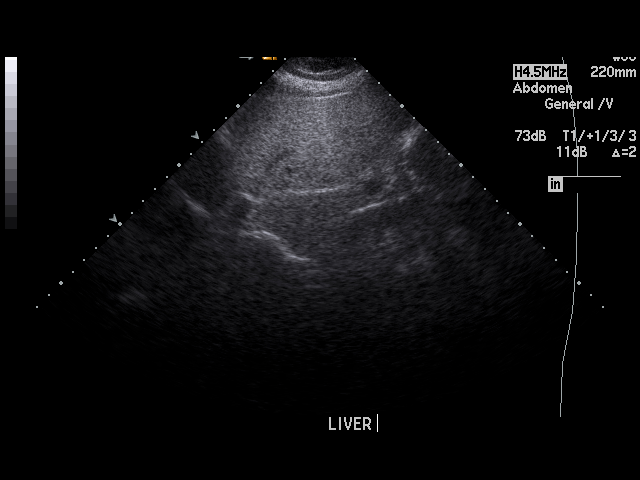
[im 40/88]
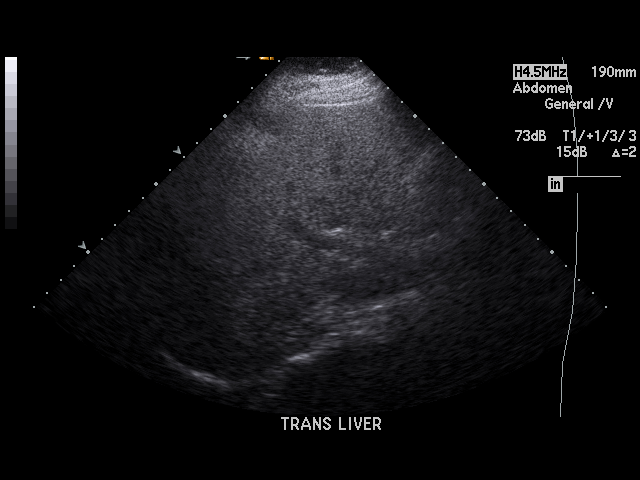
[im 44/88]
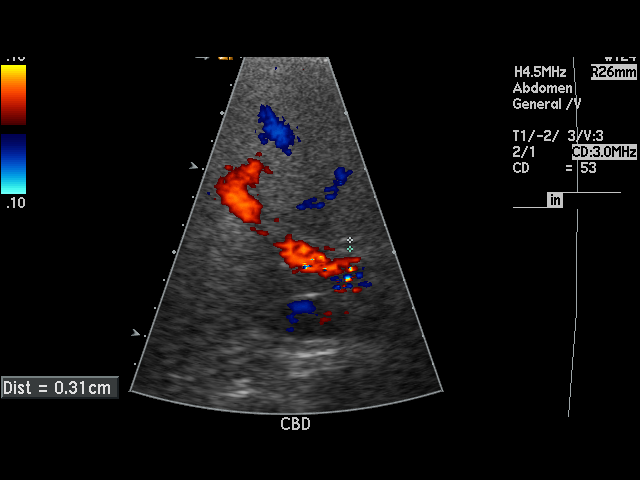
[im 48/88]
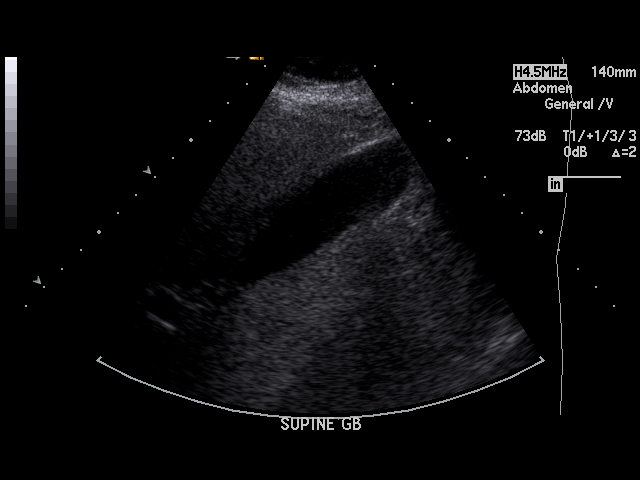
[im 55/88]
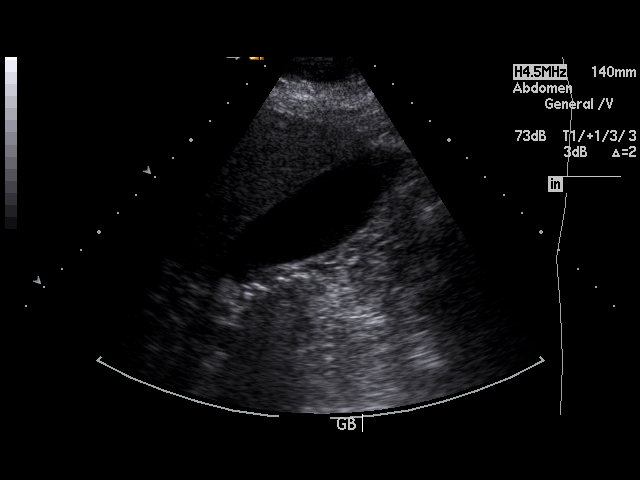
[im 59/88]
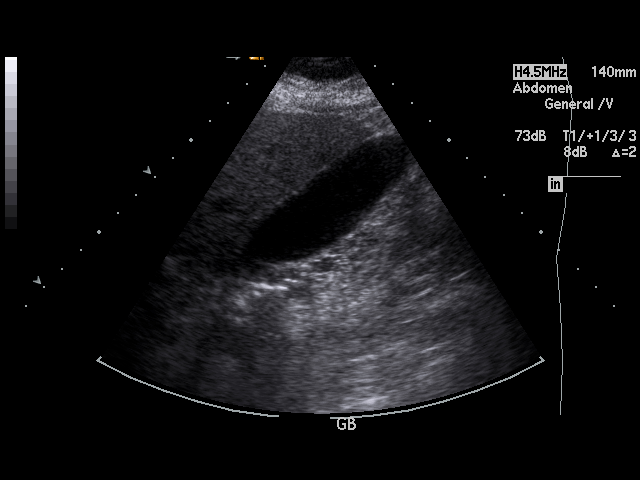
[im 66/88]
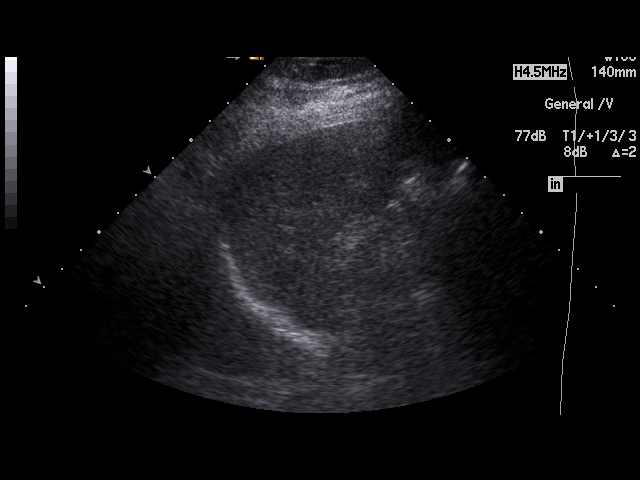
[im 69/88]
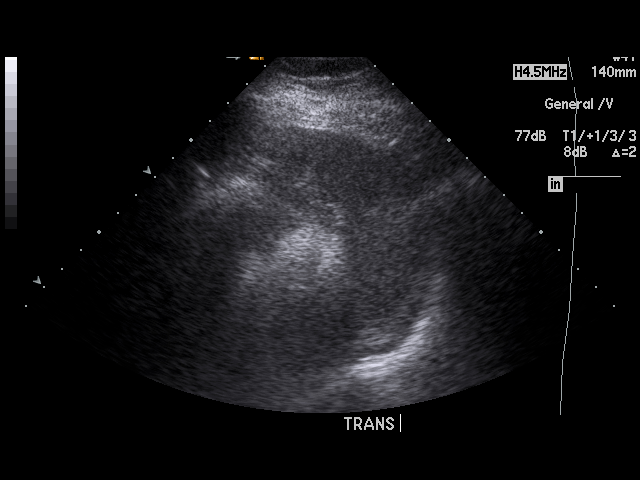
[im 77/88]
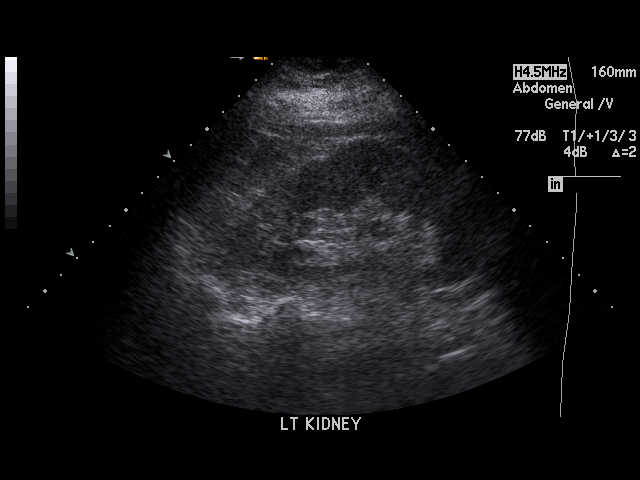
[im 80/88]
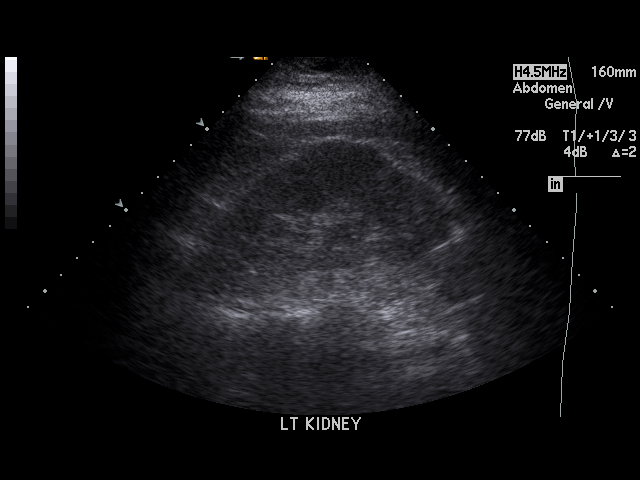
[im 88/88]
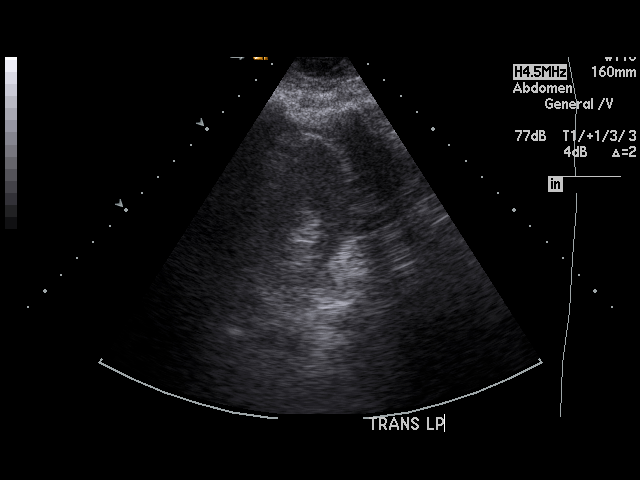

[17 of 25 positions shown; findings below may reference images not displayed]

PROCEDURE:     US  - US ABDOMEN GENERAL SURVEY  - June 30, 2008  [DATE]

RESULT:     The liver is normal in contour. Its echotexture is slightly
increased which may indicate an element of fatty infiltration. There is no
focal mass nor ductal dilation. Portal venous flow is normal in direction
toward the liver. The gallbladder is adequately distended with no evidence
of stones, wall thickening, or pericholecystic fluid. The common bile duct
measures 2.7 mm in diameter. The pancreas, spleen, abdominal aorta, and
right kidney are normal in appearance. There is mild prominence of a
parapelvic cyst versus focal pelvocaliectasis in the upper pole of the left
kidney. A structure consistent with a parapelvic cyst was visible on a
noncontrast CT scan of the abdomen on 12 December, 2007.
IMPRESSION: 1. I do not see evidence of acute hepatobiliary abnormality. There may be
mild fatty infiltrative change of the liver.
2. There is no definite acute urinary tract abnormality. There is likely a
parapelvic cyst in the upper pole of the left kidney.

## 2010-09-15 ENCOUNTER — Emergency Department: Payer: Self-pay | Admitting: Emergency Medicine

## 2010-09-28 ENCOUNTER — Emergency Department: Payer: Self-pay | Admitting: Emergency Medicine

## 2010-10-04 ENCOUNTER — Emergency Department: Payer: Self-pay | Admitting: Emergency Medicine

## 2010-11-06 ENCOUNTER — Emergency Department: Payer: Self-pay | Admitting: Emergency Medicine

## 2010-11-24 IMAGING — CR DG CHEST 1V PORT
1 series · 1 of 1 positions shown · non-contrast
Comparison: none

REASON FOR EXAM: Chest Pain
COMMENTS:

PROCEDURE:     DXR - DXR PORTABLE CHEST SINGLE VIEW  - September 11, 2008  [DATE]
RESULT:     No acute abnormalities. The patient has had a prior CABG.  Chest
is stable from 05-02-08.

[view not recorded]
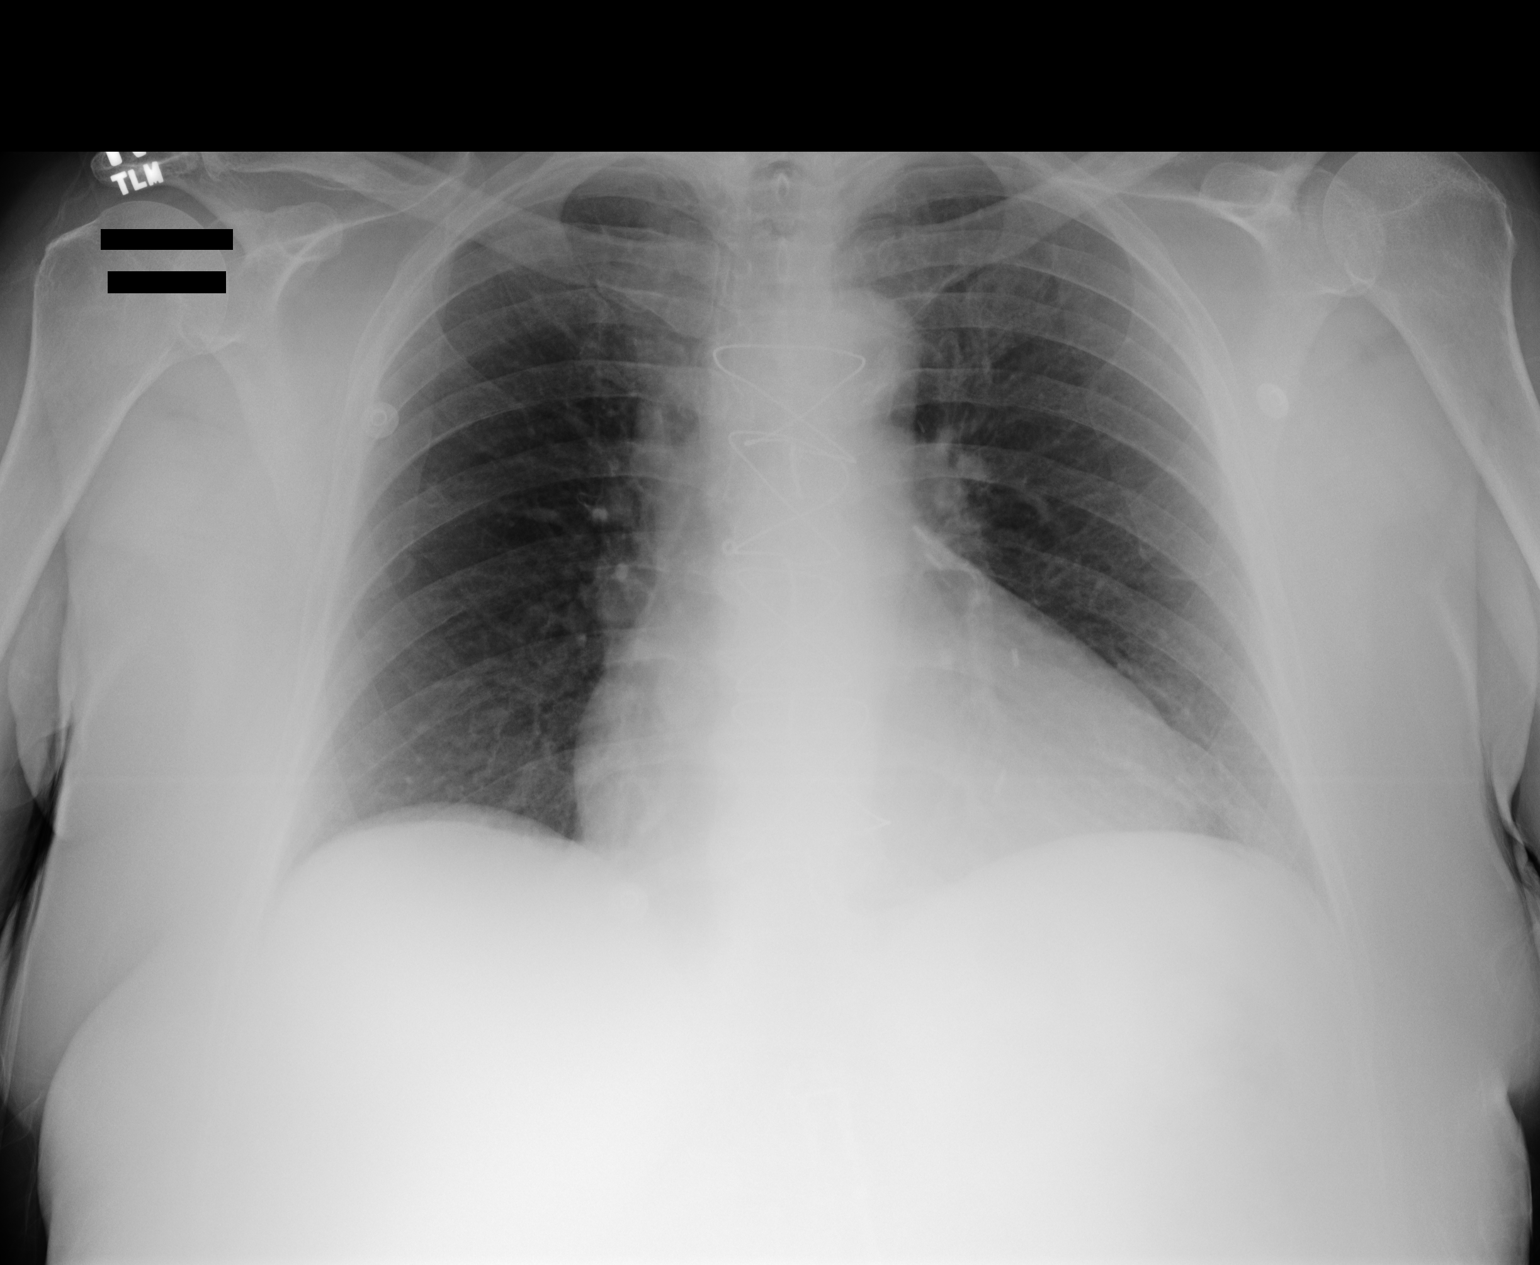

[1 of 1 positions shown; findings below may reference images not displayed]

IMPRESSION: 1. No acute abnormalities. Chest is stable from 05-02-08.

## 2010-11-26 ENCOUNTER — Emergency Department: Payer: Self-pay | Admitting: Emergency Medicine

## 2010-12-25 IMAGING — CT CT STONE STUDY
1 of 2 series · 15 of 32 positions shown, 19 images · non-contrast
Comparison: 12/12/2007

REASON FOR EXAM: r flank pain
COMMENTS:

PROCEDURE:     CT  - CT ABDOMEN /PELVIS WO (STONE)  - October 12, 2008  [DATE]
RESULT:     Indication: Right flank pain
TECHNIQUE: Multiple axial images from the lung bases to the symphysis pubis
were obtained without oral or intravenous contrast.

[Series 2: stone · axial · 0.65mm/px · z∈[+118,+529]mm · 15 of 149 slices shown, 19 images]
[im 6/149  soft-tissue]
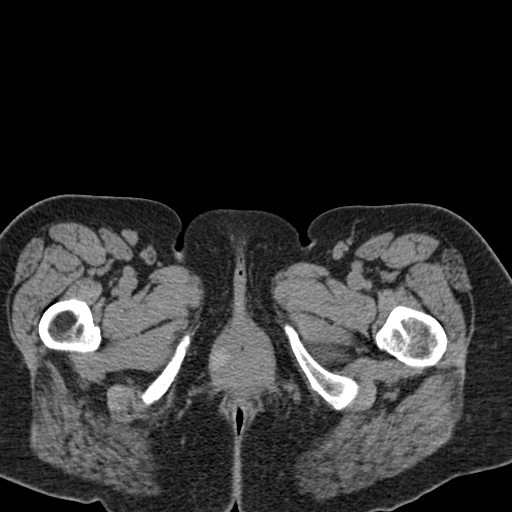
[im 6/149  bone]
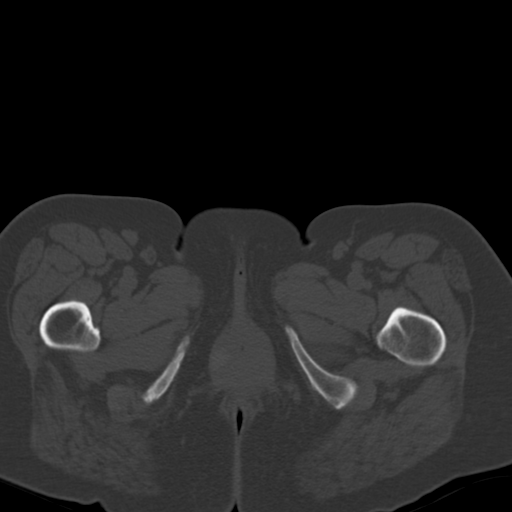
[im 18/149  soft-tissue]
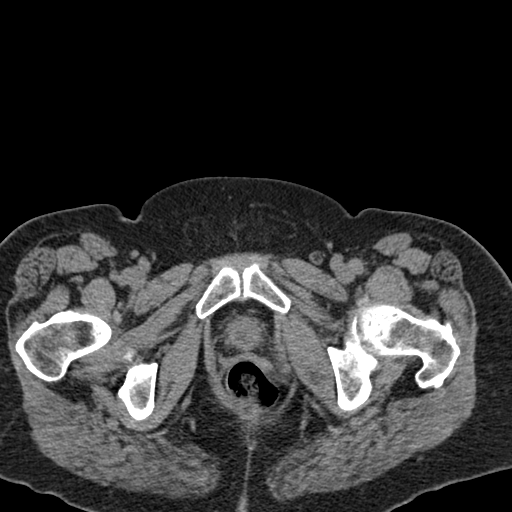
[im 29/149  soft-tissue]
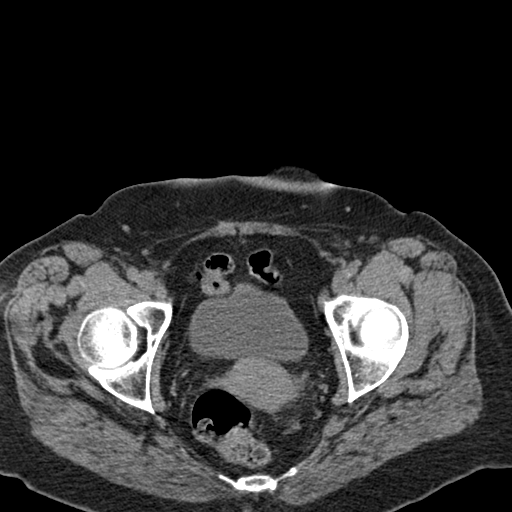
[im 40/149  soft-tissue]
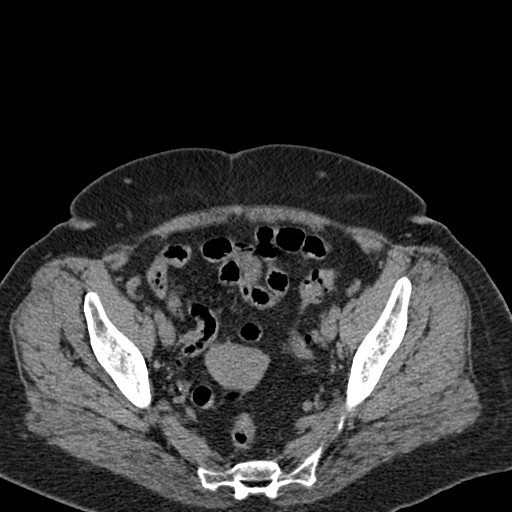
[im 52/149  soft-tissue]
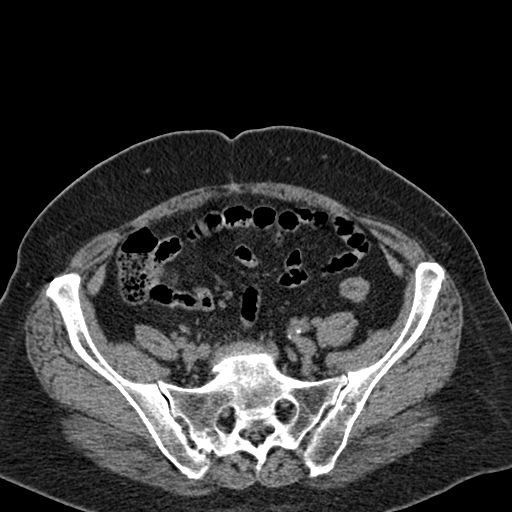
[im 63/149  soft-tissue]
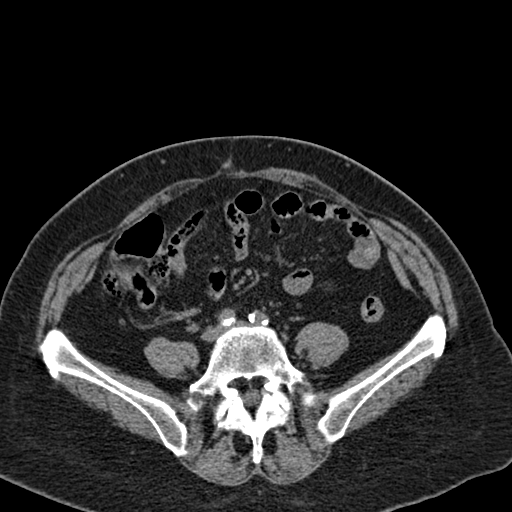
[im 75/149  soft-tissue]
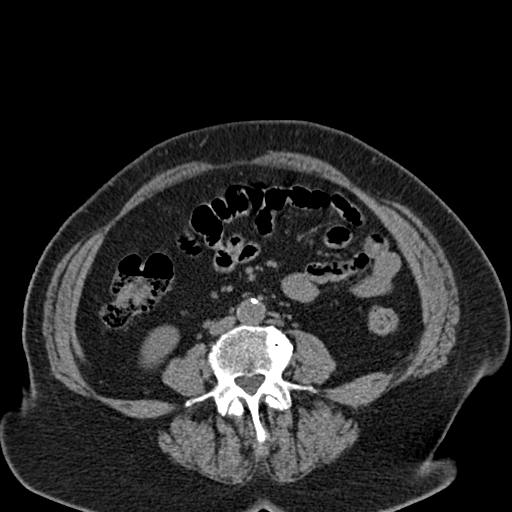
[im 86/149  soft-tissue]
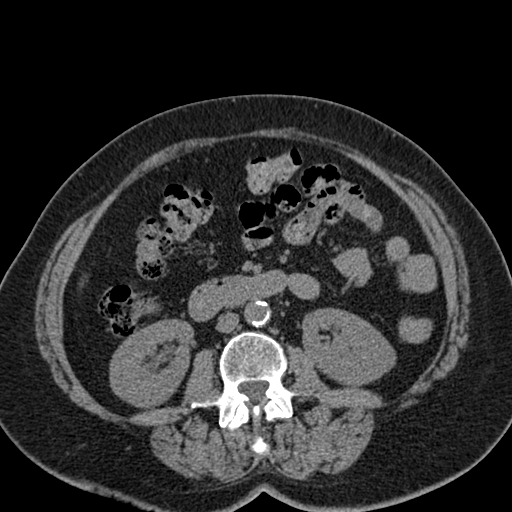
[im 97/149  soft-tissue]
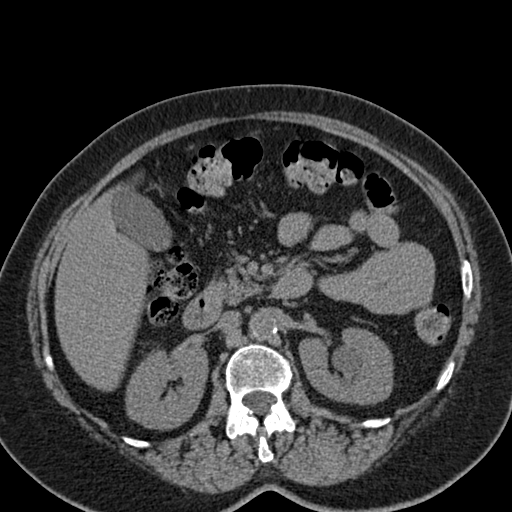
[im 97/149  bone]
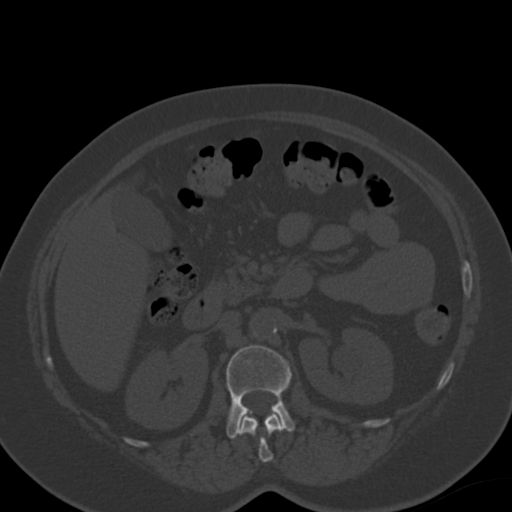
[im 109/149  soft-tissue]
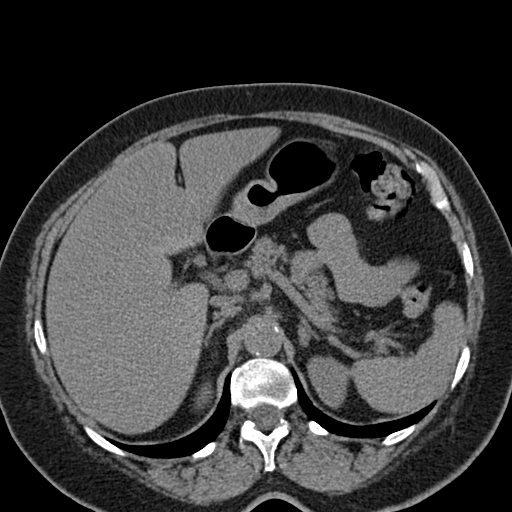
[im 120/149  soft-tissue]
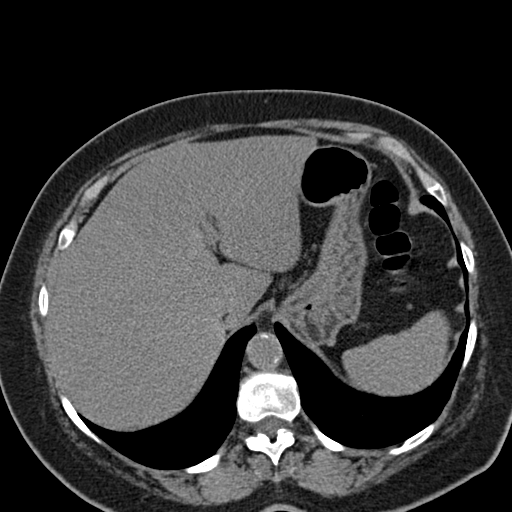
[im 126/149  lung]
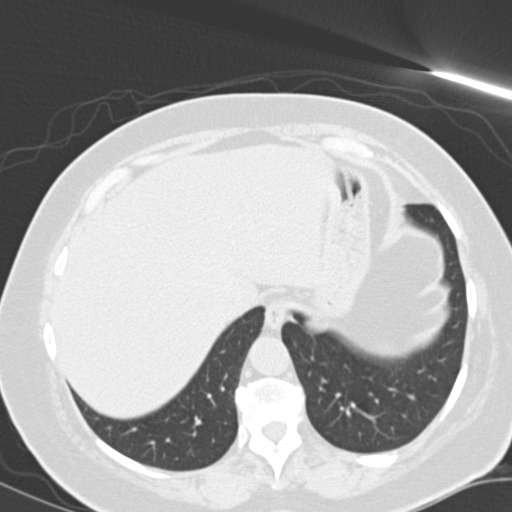
[im 131/149  soft-tissue]
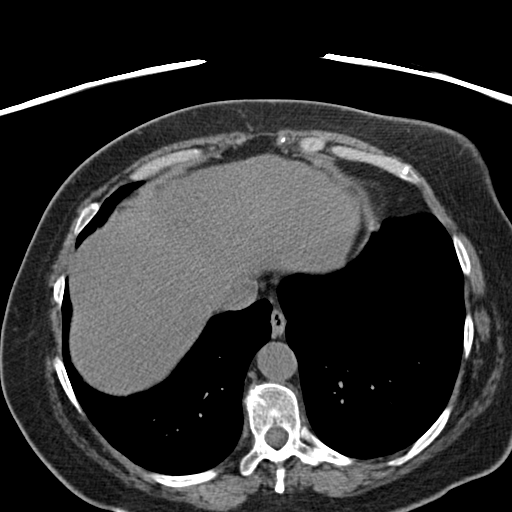
[im 131/149  lung]
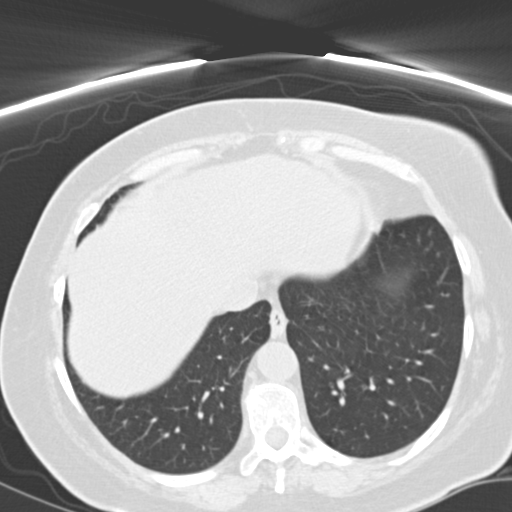
[im 137/149  lung]
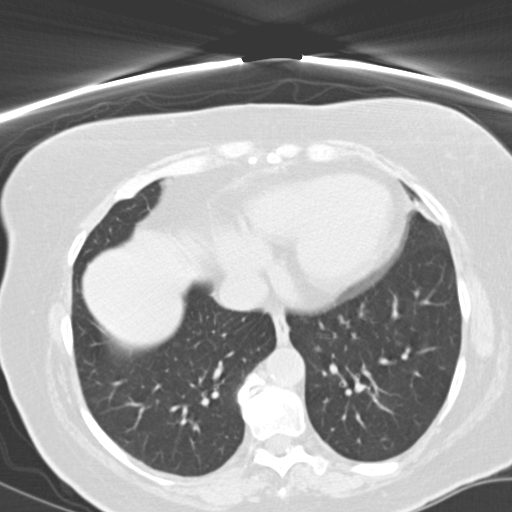
[im 143/149  soft-tissue]
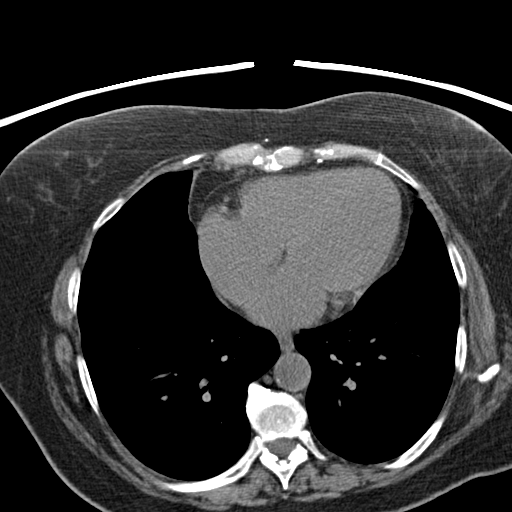
[im 143/149  lung]
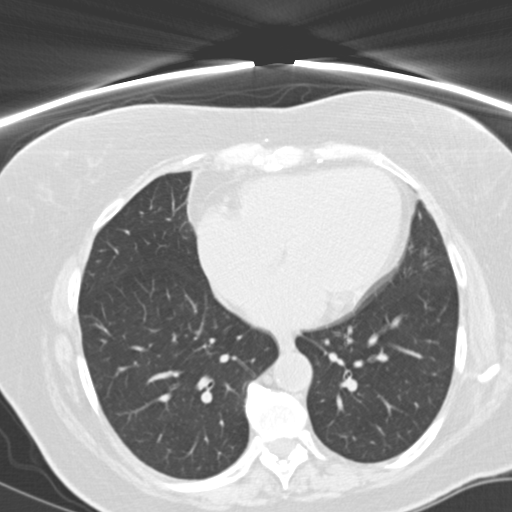

[15 of 32 positions shown; findings below may reference images not displayed]

FINDINGS: The lung bases are clear. There is no pleural or pericardial effusions.

No renal, ureteral, or bladder calculi. No obstructive uropathy. No
perinephric stranding is seen. The kidneys are symmetric in size without
evidence for exophytic mass. The bladder is unremarkable.

The liver is diffusely low in attenuation likely secondary to hepatic
steatosis. The gallbladder is unremarkable. The spleen demonstrates no focal
abnormality. The adrenal glands and pancreas are normal.

The unopacified stomach, duodenum, small intestine, and large intestine are
unremarkable, but evaluation is limited by lack of oral contrast. There is
diverticulosis without evidence of diverticulitis. There is no
pneumoperitoneum, pneumatosis, or portal venous gas. There is no abdominal
or pelvic free fluid. There is no lymphadenopathy. Again demonstrated is a 9
mm right hyperdense nodule in the right labia likely representing a
Bartholin's gland cyst.

The abdominal aorta is normal in caliber.

There are bilateral L5 pars interarticularis defects.
IMPRESSION: 1. No urolithiasis or obstructive uropathy.

## 2011-01-09 ENCOUNTER — Emergency Department: Payer: Self-pay | Admitting: Emergency Medicine

## 2011-01-19 ENCOUNTER — Emergency Department: Payer: Self-pay | Admitting: Emergency Medicine

## 2011-02-06 ENCOUNTER — Emergency Department: Payer: Self-pay | Admitting: Emergency Medicine

## 2011-02-25 ENCOUNTER — Emergency Department: Payer: Self-pay | Admitting: Emergency Medicine

## 2011-03-11 IMAGING — CR DG CHEST 2V
1 series · 2 of 2 positions shown · non-contrast
Comparison: none

REASON FOR EXAM: dyspnea/sob
COMMENTS:

PROCEDURE:     DXR - DXR CHEST PA (OR AP) AND LATERAL  - December 27, 2008  [DATE]
RESULT:     No acute cardiopulmonary disease noted.

[Series 1: view not recorded · 0.17mm/px · 2 of 2 slices shown]
[im 1/2]
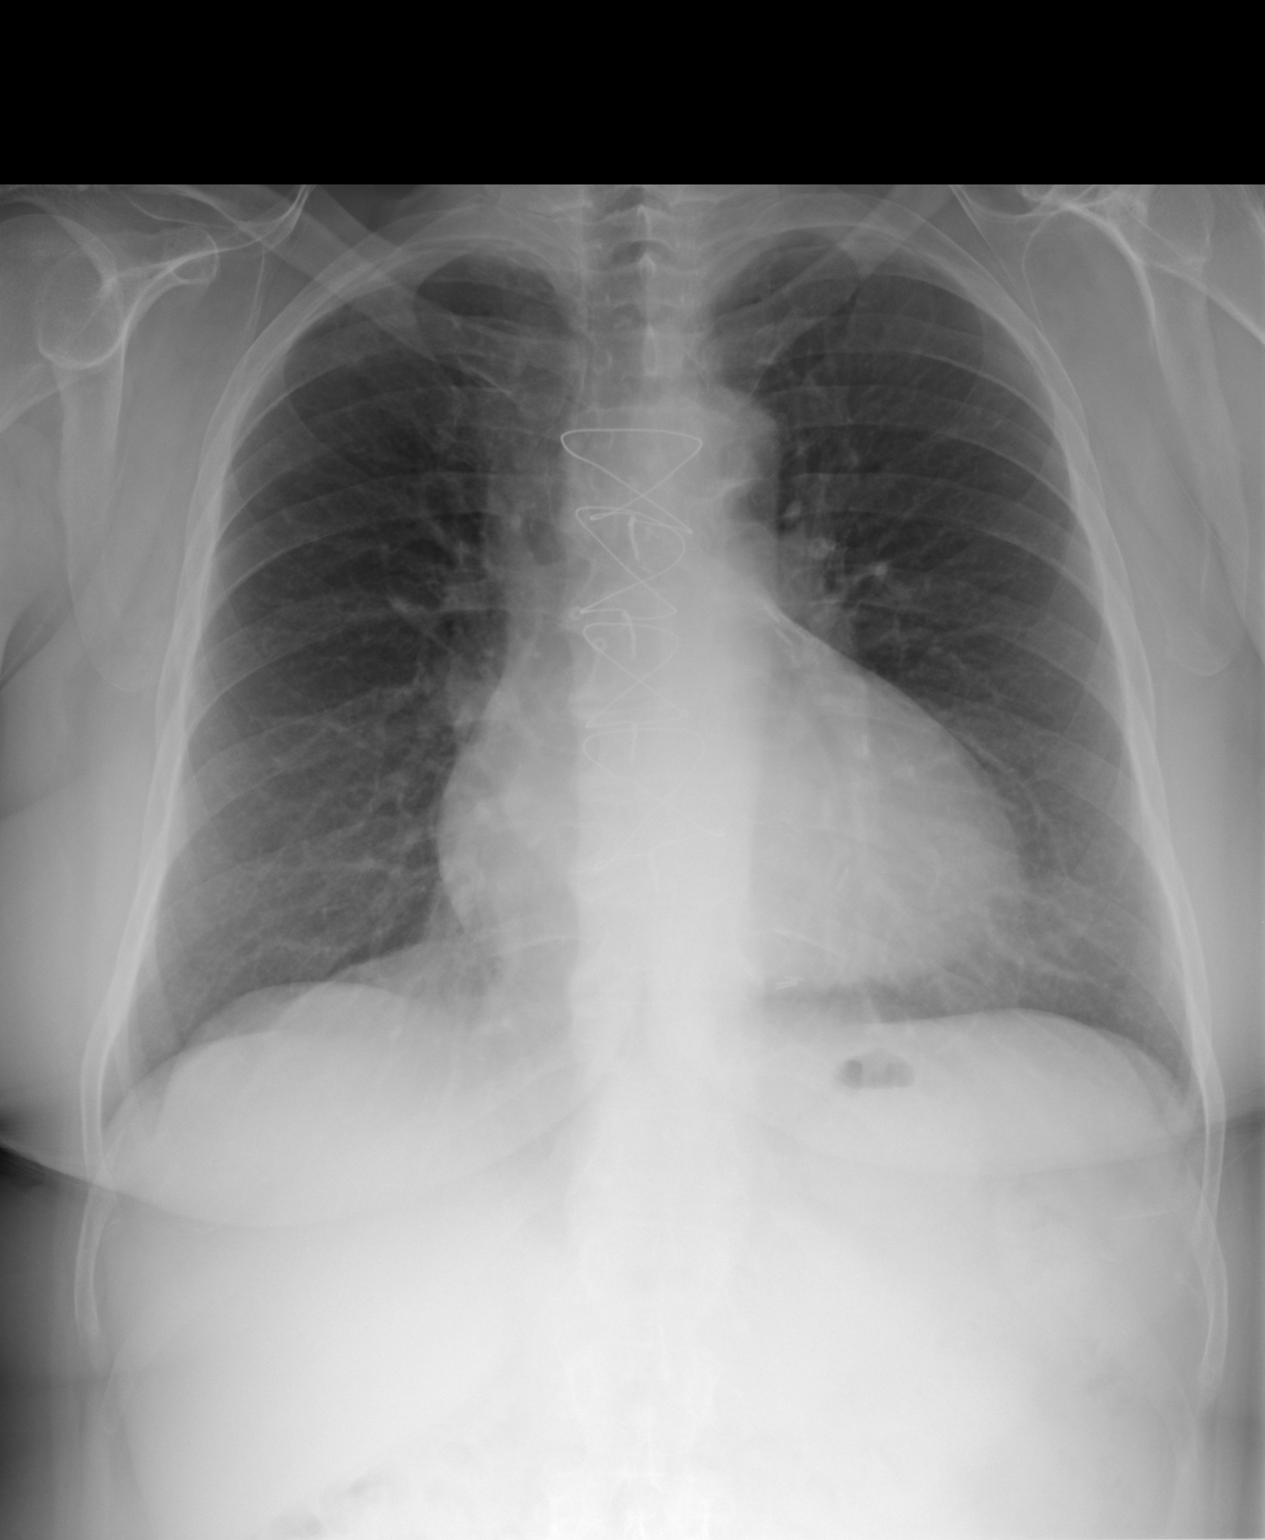
[im 2/2]
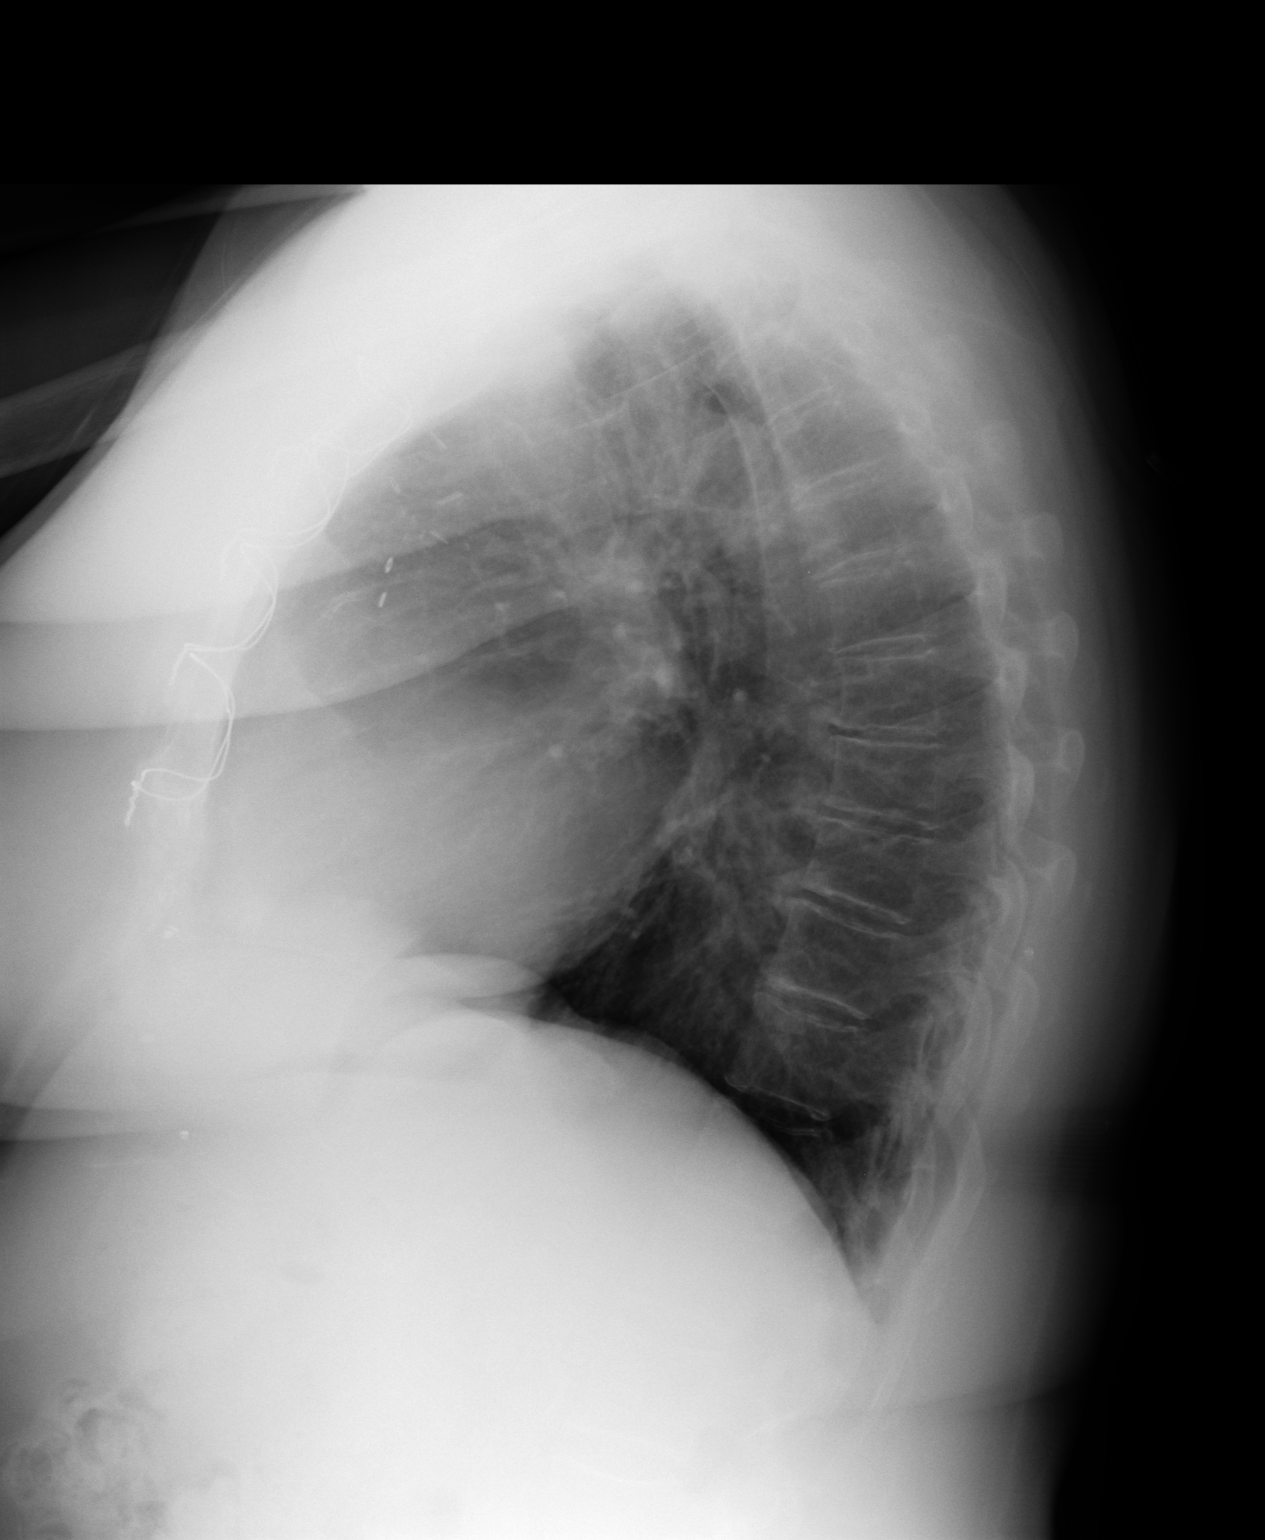

[2 of 2 positions shown; findings below may reference images not displayed]

IMPRESSION: No acute cardiopulmonary disease. The patient has had prior
CABG. The chest is stable from 09/11/2008.

## 2011-03-18 ENCOUNTER — Emergency Department: Payer: Self-pay | Admitting: Unknown Physician Specialty

## 2011-04-07 ENCOUNTER — Emergency Department: Payer: Self-pay | Admitting: *Deleted

## 2011-04-07 LAB — COMPREHENSIVE METABOLIC PANEL
Albumin: 3.8 g/dL (ref 3.4–5.0)
Anion Gap: 10 (ref 7–16)
Bilirubin,Total: 0.3 mg/dL (ref 0.2–1.0)
Calcium, Total: 9.1 mg/dL (ref 8.5–10.1)
Co2: 29 mmol/L (ref 21–32)
EGFR (Non-African Amer.): >60
Osmolality: 295 (ref 275–301)
Potassium: 4 mmol/L (ref 3.5–5.1)
SGOT(AST): 19 U/L (ref 15–37)
Sodium: 145 mmol/L (ref 136–145)

## 2011-04-07 LAB — PROTIME-INR: Prothrombin Time: 12.7 secs (ref 11.5–14.7)

## 2011-04-07 LAB — URINALYSIS, COMPLETE
Bacteria: NONE SEEN
Bilirubin,UR: NEGATIVE
Glucose,UR: NEGATIVE mg/dL (ref 0–75)
Nitrite: NEGATIVE
Specific Gravity: 1.02 (ref 1.003–1.030)
Squamous Epithelial: 5
WBC UR: 48 /HPF (ref 0–5)

## 2011-04-07 LAB — CBC
HCT: 38.4 % (ref 35.0–47.0)
HGB: 12.5 g/dL (ref 12.0–16.0)
MCH: 28 pg (ref 26.0–34.0)
MCHC: 32.6 g/dL (ref 32.0–36.0)
MCV: 86 fL (ref 80–100)
Platelet: 267 10*3/uL (ref 150–440)
RBC: 4.47 10*6/uL (ref 3.80–5.20)

## 2011-04-07 LAB — CK TOTAL AND CKMB (NOT AT ARMC)
CK, Total: 65 U/L (ref 21–215)
CK-MB: 0.9 ng/mL (ref 0.5–3.6)

## 2011-04-07 LAB — TROPONIN I: Troponin-I: <0.02 ng/mL

## 2011-05-13 ENCOUNTER — Emergency Department: Payer: Self-pay | Admitting: Unknown Physician Specialty

## 2011-05-13 LAB — COMPREHENSIVE METABOLIC PANEL
Albumin: 3.6 g/dL (ref 3.4–5.0)
Anion Gap: 10 (ref 7–16)
Bilirubin,Total: 0.3 mg/dL (ref 0.2–1.0)
Calcium, Total: 9 mg/dL (ref 8.5–10.1)
Chloride: 106 mmol/L (ref 98–107)
Creatinine: 0.6 mg/dL (ref 0.60–1.30)
EGFR (Non-African Amer.): >60
Glucose: 147 mg/dL — ABNORMAL HIGH (ref 65–99)
Osmolality: 293 (ref 275–301)
Potassium: 4 mmol/L (ref 3.5–5.1)
Sodium: 144 mmol/L (ref 136–145)
Total Protein: 7.1 g/dL (ref 6.4–8.2)

## 2011-05-13 LAB — URINALYSIS, COMPLETE
Bacteria: NONE SEEN
Bilirubin,UR: NEGATIVE
Glucose,UR: NEGATIVE mg/dL (ref 0–75)
Nitrite: NEGATIVE
Ph: 6 (ref 4.5–8.0)
Protein: NEGATIVE
RBC,UR: 1 /HPF (ref 0–5)
Squamous Epithelial: 1
WBC UR: 1 /HPF (ref 0–5)

## 2011-05-13 LAB — CBC
HCT: 36 % (ref 35.0–47.0)
MCH: 27.6 pg (ref 26.0–34.0)
MCHC: 32.4 g/dL (ref 32.0–36.0)
MCV: 85 fL (ref 80–100)
RDW: 13.1 % (ref 11.5–14.5)

## 2011-05-18 ENCOUNTER — Emergency Department: Payer: Self-pay | Admitting: Emergency Medicine

## 2011-05-18 LAB — BASIC METABOLIC PANEL
Calcium, Total: 8.5 mg/dL (ref 8.5–10.1)
Co2: 26 mmol/L (ref 21–32)
EGFR (African American): >60
EGFR (Non-African Amer.): >60
Glucose: 161 mg/dL — ABNORMAL HIGH (ref 65–99)
Osmolality: 292 (ref 275–301)
Potassium: 3.5 mmol/L (ref 3.5–5.1)

## 2011-05-18 LAB — CBC
HCT: 36.3 % (ref 35.0–47.0)
MCHC: 32.9 g/dL (ref 32.0–36.0)
Platelet: 261 10*3/uL (ref 150–440)
RDW: 14.2 % (ref 11.5–14.5)

## 2011-08-19 IMAGING — CR DG CHEST 2V
1 series · 2 of 2 positions shown · non-contrast
Comparison: none

REASON FOR EXAM: CP
COMMENTS:   May transport without cardiac monitor

[Series 1: view not recorded · 0.17mm/px · 2 of 2 slices shown]
[im 1/2]
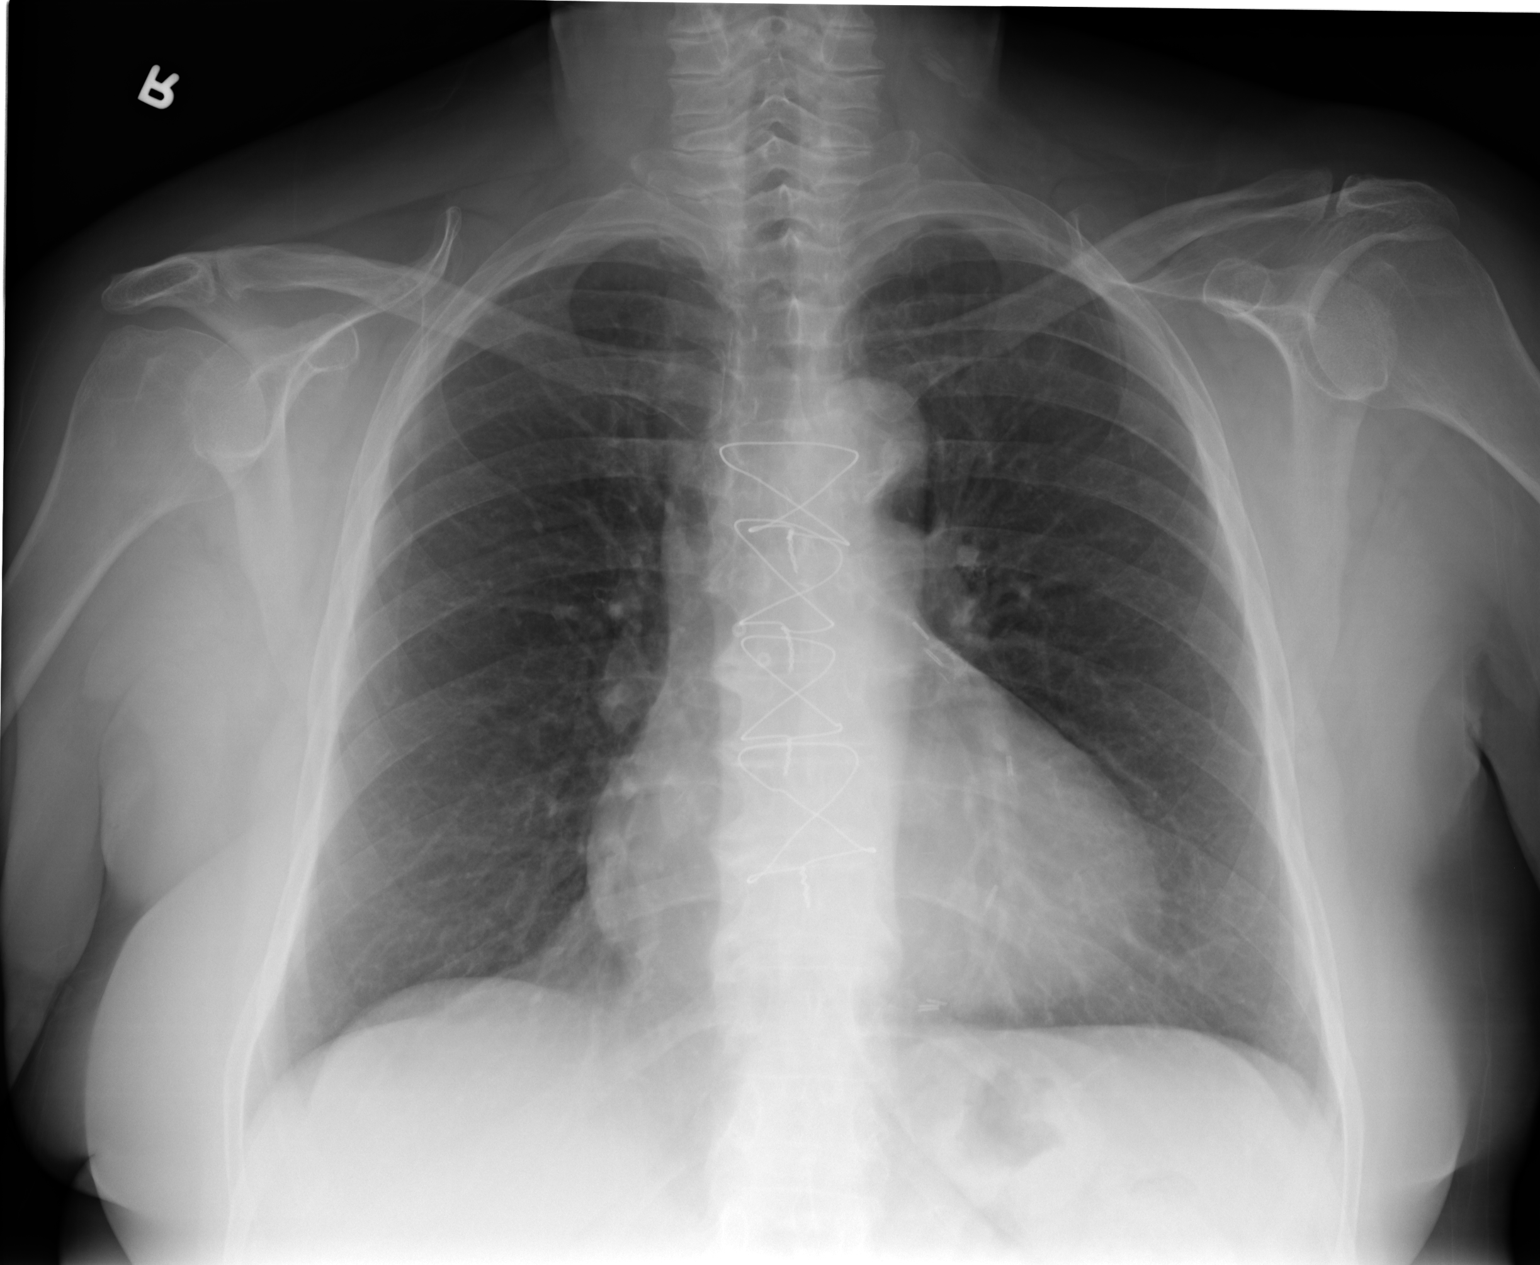
[im 2/2]
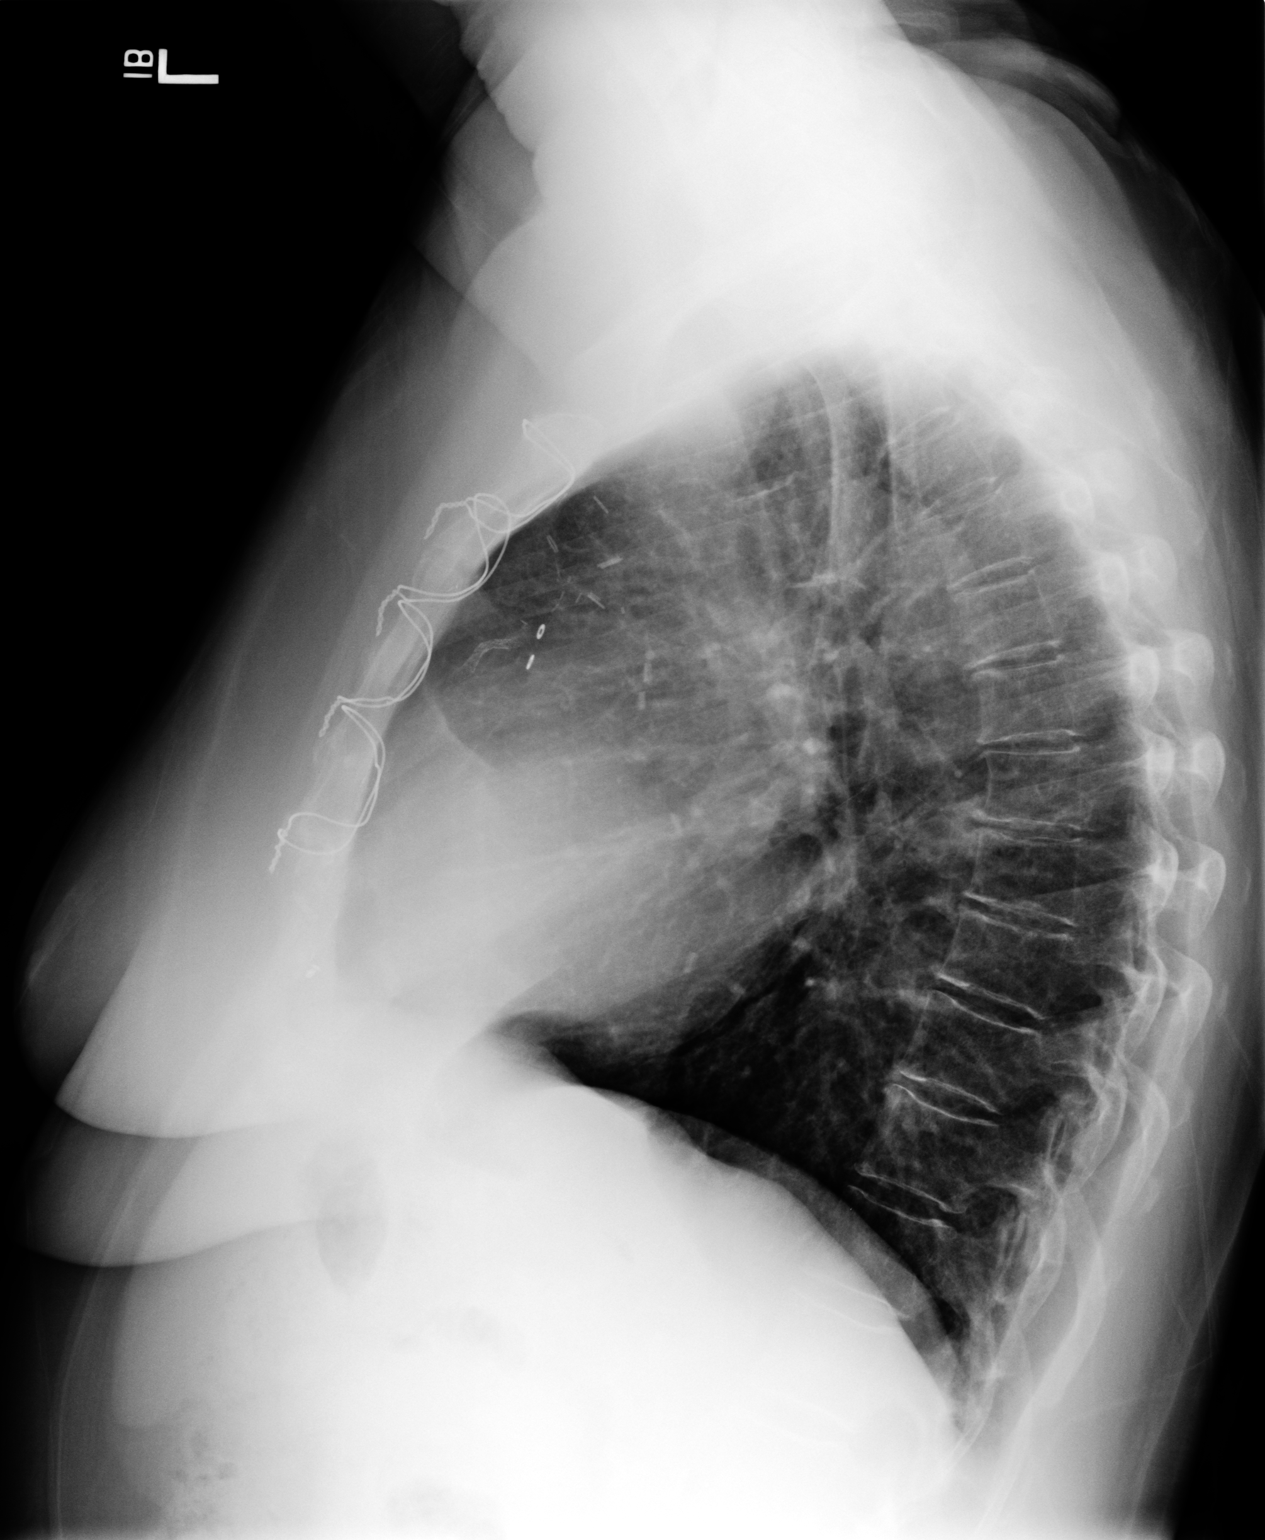

[2 of 2 positions shown; findings below may reference images not displayed]

PROCEDURE:     DXR - DXR CHEST PA (OR AP) AND LATERAL  - June 06, 2009  [DATE]

RESULT:     Comparison is made to prior study dated 12/27/2008.

There is no evidence of focal infiltrates, effusions or edema. There is mild
flattening of the hemidiaphragms. The cardiac silhouette is moderately
enlarged. The visualized bony skeleton is unremarkable. The patient is
status post median sternotomy and coronary artery bypass grafting.
IMPRESSION: Mild COPD without evidence of acute cardiopulmonary disease.

## 2011-08-26 ENCOUNTER — Emergency Department: Payer: Self-pay | Admitting: Emergency Medicine

## 2011-08-26 LAB — CBC
MCH: 27.6 pg (ref 26.0–34.0)
MCHC: 32 g/dL (ref 32.0–36.0)
MCV: 86 fL (ref 80–100)
Platelet: 245 10*3/uL (ref 150–440)
RBC: 4.15 10*6/uL (ref 3.80–5.20)
RDW: 14 % (ref 11.5–14.5)
WBC: 8.6 10*3/uL (ref 3.6–11.0)

## 2011-08-26 LAB — URINALYSIS, COMPLETE
Bacteria: NONE SEEN
Bilirubin,UR: NEGATIVE
Blood: NEGATIVE
Glucose,UR: NEGATIVE mg/dL (ref 0–75)
RBC,UR: 1 /HPF (ref 0–5)
WBC UR: 9 /HPF (ref 0–5)

## 2011-08-26 LAB — COMPREHENSIVE METABOLIC PANEL
Albumin: 3.4 g/dL (ref 3.4–5.0)
Anion Gap: 7 (ref 7–16)
BUN: 18 mg/dL (ref 7–18)
Chloride: 108 mmol/L — ABNORMAL HIGH (ref 98–107)
Co2: 27 mmol/L (ref 21–32)
Creatinine: 0.58 mg/dL — ABNORMAL LOW (ref 0.60–1.30)
EGFR (Non-African Amer.): >60
Glucose: 138 mg/dL — ABNORMAL HIGH (ref 65–99)
Osmolality: 287 (ref 275–301)
Potassium: 4 mmol/L (ref 3.5–5.1)
SGOT(AST): 15 U/L (ref 15–37)
SGPT (ALT): 22 U/L
Sodium: 142 mmol/L (ref 136–145)
Total Protein: 6.7 g/dL (ref 6.4–8.2)

## 2011-09-10 ENCOUNTER — Inpatient Hospital Stay: Payer: Self-pay | Admitting: Internal Medicine

## 2011-09-10 LAB — CK TOTAL AND CKMB (NOT AT ARMC)
CK, Total: 55 U/L (ref 21–215)
CK, Total: 61 U/L (ref 21–215)
CK-MB: 1.1 ng/mL (ref 0.5–3.6)
CK-MB: 1.2 ng/mL (ref 0.5–3.6)

## 2011-09-10 LAB — URINALYSIS, COMPLETE
Bacteria: NONE SEEN
Glucose,UR: >=500 mg/dL (ref 0–75)
Leukocyte Esterase: NEGATIVE
Nitrite: NEGATIVE
Ph: 5 (ref 4.5–8.0)
Protein: 100
Specific Gravity: 1.028 (ref 1.003–1.030)
WBC UR: <1 /HPF (ref 0–5)

## 2011-09-10 LAB — TROPONIN I
Troponin-I: <0.02 ng/mL
Troponin-I: 0.02 ng/mL
Troponin-I: <0.02 ng/mL

## 2011-09-10 LAB — CBC
HGB: 12.8 g/dL (ref 12.0–16.0)
MCH: 27.6 pg (ref 26.0–34.0)
MCHC: 32 g/dL (ref 32.0–36.0)
MCV: 86 fL (ref 80–100)
Platelet: 249 10*3/uL (ref 150–440)

## 2011-09-10 LAB — COMPREHENSIVE METABOLIC PANEL
Albumin: 3.9 g/dL (ref 3.4–5.0)
Alkaline Phosphatase: 130 U/L (ref 50–136)
Anion Gap: 10 (ref 7–16)
BUN: 24 mg/dL — ABNORMAL HIGH (ref 7–18)
Co2: 23 mmol/L (ref 21–32)
Creatinine: 0.77 mg/dL (ref 0.60–1.30)
EGFR (African American): >60
EGFR (Non-African Amer.): >60
SGOT(AST): 32 U/L (ref 15–37)
SGPT (ALT): 34 U/L
Total Protein: 7.8 g/dL (ref 6.4–8.2)

## 2011-09-10 LAB — APTT
Activated PTT: 31.1 secs (ref 23.6–35.9)
Activated PTT: 42.4 secs — ABNORMAL HIGH (ref 23.6–35.9)

## 2011-09-11 LAB — CBC WITH DIFFERENTIAL/PLATELET
Basophil #: 0.1 10*3/uL (ref 0.0–0.1)
Eosinophil #: 0.1 10*3/uL (ref 0.0–0.7)
Eosinophil %: 1.1 %
Lymphocyte #: 4.2 10*3/uL — ABNORMAL HIGH (ref 1.0–3.6)
MCH: 27.8 pg (ref 26.0–34.0)
MCHC: 32 g/dL (ref 32.0–36.0)
Monocyte #: 0.6 x10 3/mm (ref 0.2–0.9)
Monocyte %: 5.7 %
Neutrophil %: 49 %
Platelet: 221 10*3/uL (ref 150–440)
RBC: 4.11 10*6/uL (ref 3.80–5.20)
WBC: 9.7 10*3/uL (ref 3.6–11.0)

## 2011-09-11 LAB — BASIC METABOLIC PANEL
Anion Gap: 7 (ref 7–16)
Calcium, Total: 8.5 mg/dL (ref 8.5–10.1)
Chloride: 106 mmol/L (ref 98–107)
Co2: 29 mmol/L (ref 21–32)
EGFR (African American): >60
EGFR (Non-African Amer.): >60
Glucose: 124 mg/dL — ABNORMAL HIGH (ref 65–99)
Osmolality: 288 (ref 275–301)
Potassium: 4.5 mmol/L (ref 3.5–5.1)

## 2011-09-11 LAB — APTT
Activated PTT: 42.7 secs — ABNORMAL HIGH (ref 23.6–35.9)
Activated PTT: 51.7 secs — ABNORMAL HIGH (ref 23.6–35.9)

## 2011-09-11 LAB — LIPID PANEL
Cholesterol: 211 mg/dL — ABNORMAL HIGH (ref 0–200)
HDL Cholesterol: 42 mg/dL (ref 40–60)
Ldl Cholesterol, Calc: 118 mg/dL — ABNORMAL HIGH (ref 0–100)
Triglycerides: 255 mg/dL — ABNORMAL HIGH (ref 0–200)
VLDL Cholesterol, Calc: 51 mg/dL — ABNORMAL HIGH (ref 5–40)

## 2011-10-07 ENCOUNTER — Emergency Department: Payer: Self-pay | Admitting: Unknown Physician Specialty

## 2011-10-07 LAB — URINALYSIS, COMPLETE
Bilirubin,UR: NEGATIVE
Glucose,UR: NEGATIVE mg/dL (ref 0–75)
Ketone: NEGATIVE
Ph: 5 (ref 4.5–8.0)
Protein: NEGATIVE
Specific Gravity: 1.012 (ref 1.003–1.030)
Squamous Epithelial: 1
WBC UR: 1 /HPF (ref 0–5)

## 2011-10-07 LAB — CBC
HCT: 36 % (ref 35.0–47.0)
HGB: 12 g/dL (ref 12.0–16.0)
MCHC: 33.4 g/dL (ref 32.0–36.0)
RBC: 4.21 10*6/uL (ref 3.80–5.20)
WBC: 8.9 10*3/uL (ref 3.6–11.0)

## 2011-10-07 LAB — COMPREHENSIVE METABOLIC PANEL
Alkaline Phosphatase: 143 U/L — ABNORMAL HIGH (ref 50–136)
Anion Gap: 7 (ref 7–16)
BUN: 18 mg/dL (ref 7–18)
Bilirubin,Total: 0.3 mg/dL (ref 0.2–1.0)
Calcium, Total: 8.8 mg/dL (ref 8.5–10.1)
Chloride: 107 mmol/L (ref 98–107)
EGFR (African American): >60
EGFR (Non-African Amer.): >60
Osmolality: 288 (ref 275–301)
Potassium: 3.9 mmol/L (ref 3.5–5.1)
SGOT(AST): 21 U/L (ref 15–37)
SGPT (ALT): 43 U/L

## 2011-10-07 LAB — CK TOTAL AND CKMB (NOT AT ARMC)
CK, Total: 87 U/L (ref 21–215)
CK-MB: 0.9 ng/mL (ref 0.5–3.6)

## 2011-10-07 LAB — PRO B NATRIURETIC PEPTIDE: B-Type Natriuretic Peptide: 620 pg/mL — ABNORMAL HIGH (ref 0–125)

## 2011-10-07 LAB — TROPONIN I: Troponin-I: <0.02 ng/mL

## 2011-10-13 IMAGING — CT CT STONE STUDY
1 of 2 series · 15 of 32 positions shown, 19 images · non-contrast
Comparison: 10/12/2008

REASON FOR EXAM: rlq pain off and on for over 3week
COMMENTS:

PROCEDURE:     CT  - CT ABDOMEN /PELVIS WO (STONE)  - July 31, 2009 [DATE]
RESULT:     Indication: Right lower quadrant pain
TECHNIQUE: Multiple axial images from the lung bases to the symphysis pubis
were obtained without oral and without intravenous contrast.

[Series 3: stone · axial · 0.75mm/px · z∈[-399,-3]mm · 15 of 144 slices shown, 19 images]
[im 6/144  soft-tissue]
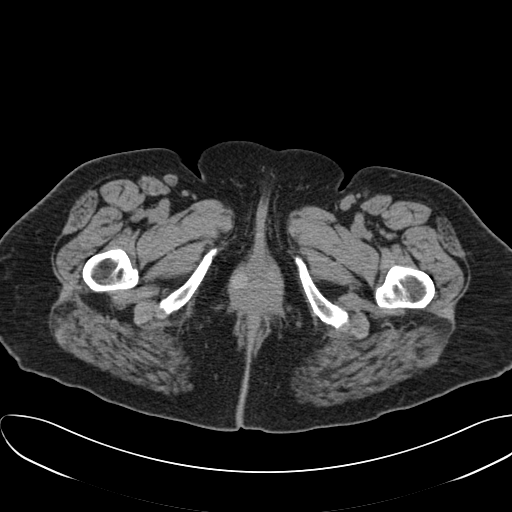
[im 6/144  bone]
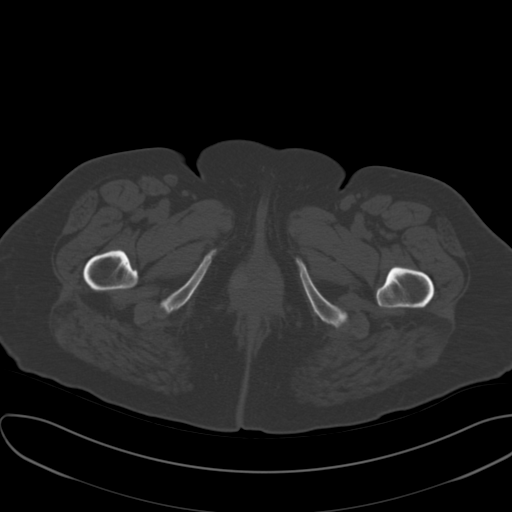
[im 17/144  soft-tissue]
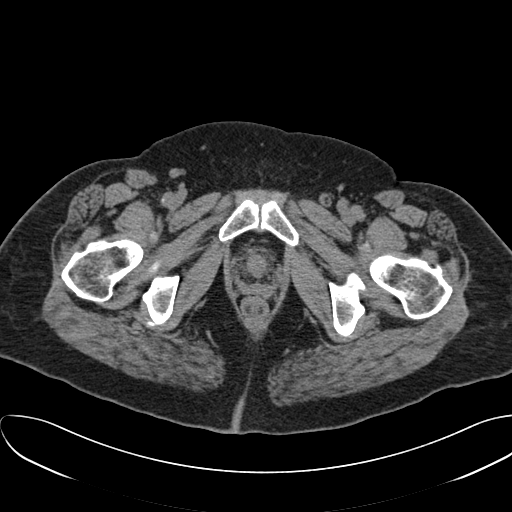
[im 28/144  soft-tissue]
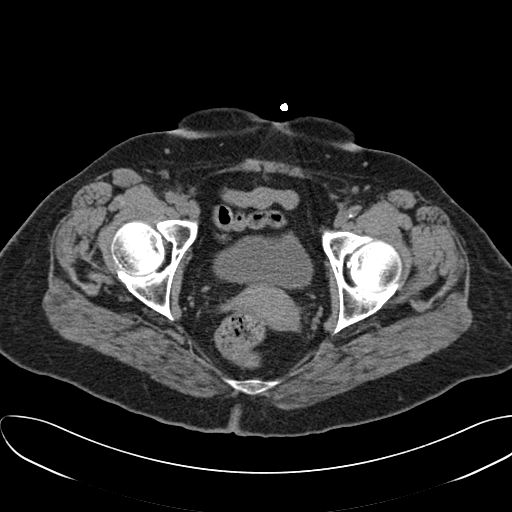
[im 39/144  soft-tissue]
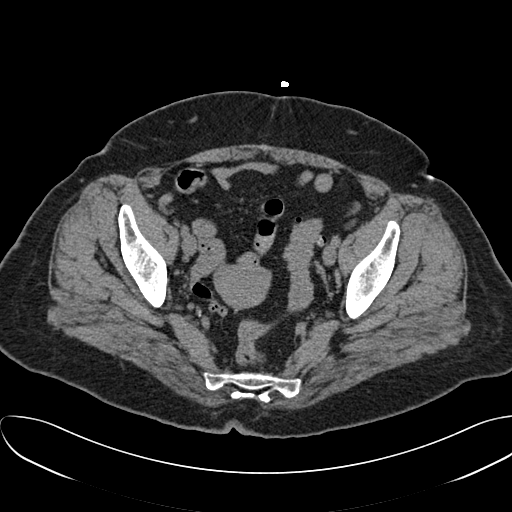
[im 50/144  soft-tissue]
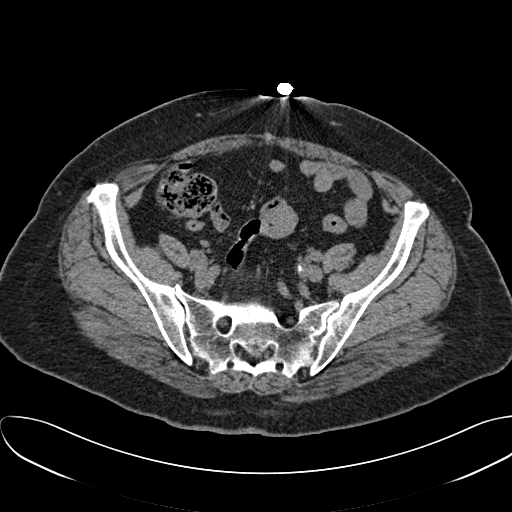
[im 61/144  soft-tissue]
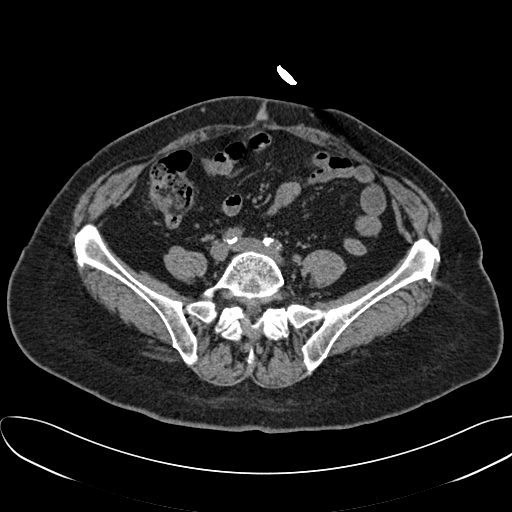
[im 72/144  soft-tissue]
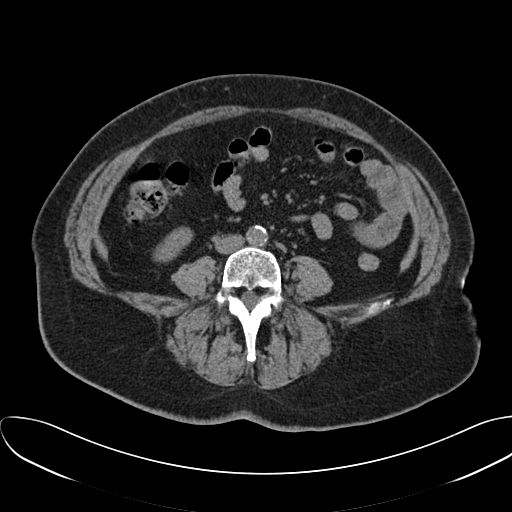
[im 83/144  soft-tissue]
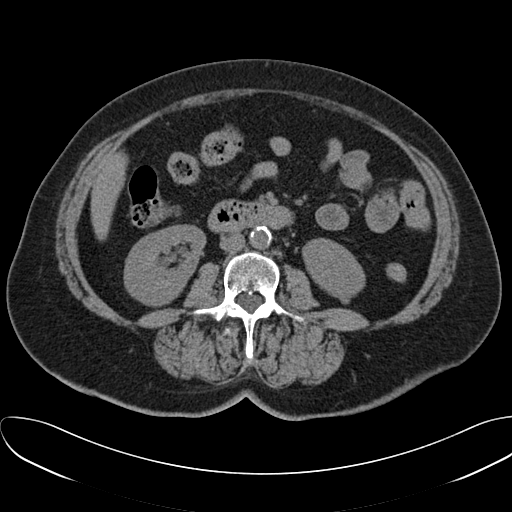
[im 94/144  soft-tissue]
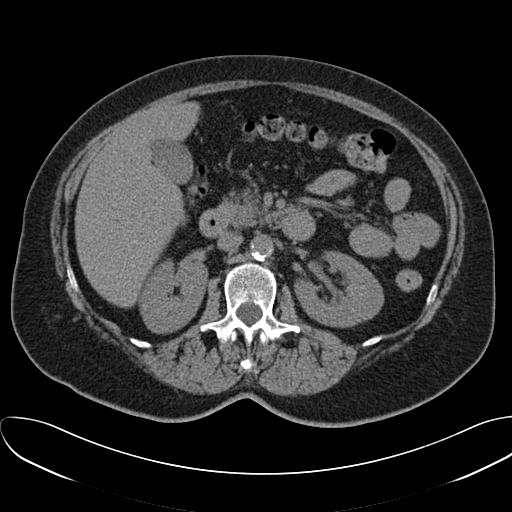
[im 94/144  bone]
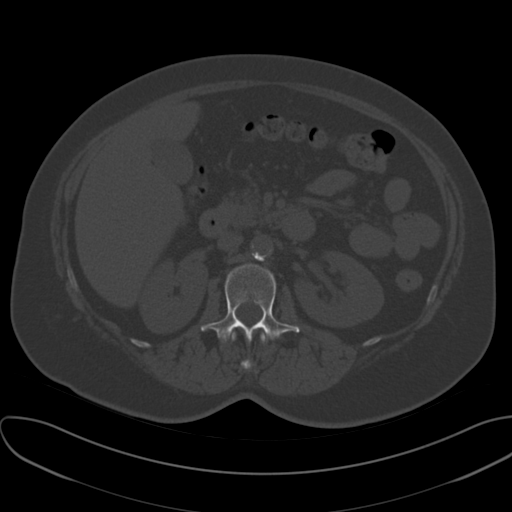
[im 105/144  soft-tissue]
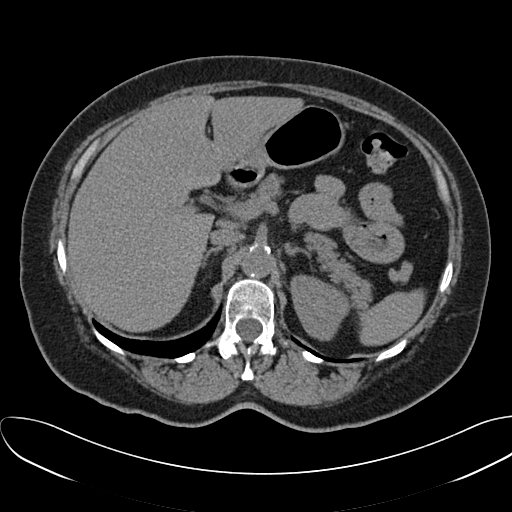
[im 116/144  soft-tissue]
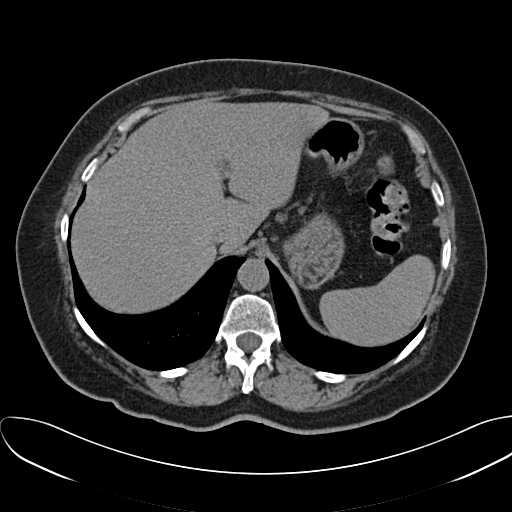
[im 122/144  lung]
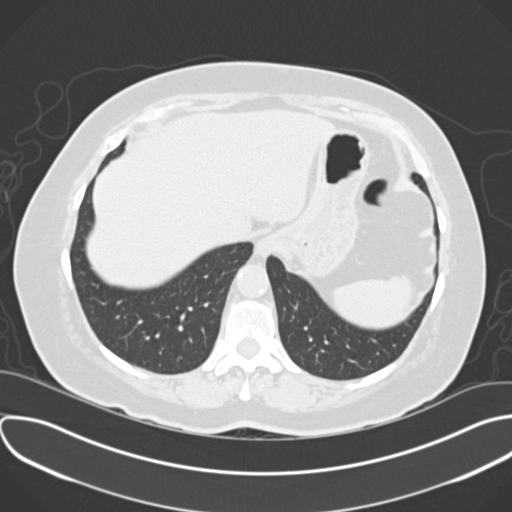
[im 127/144  soft-tissue]
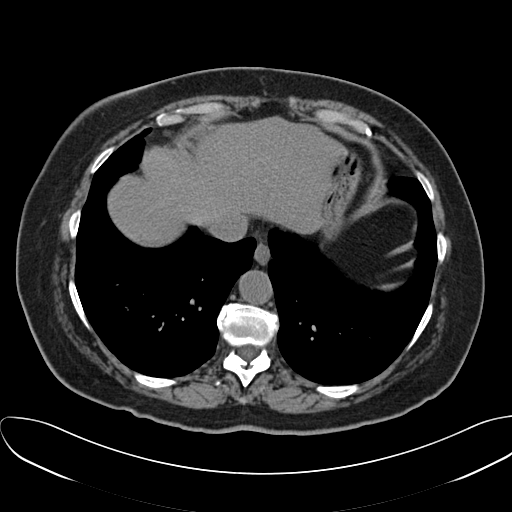
[im 127/144  lung]
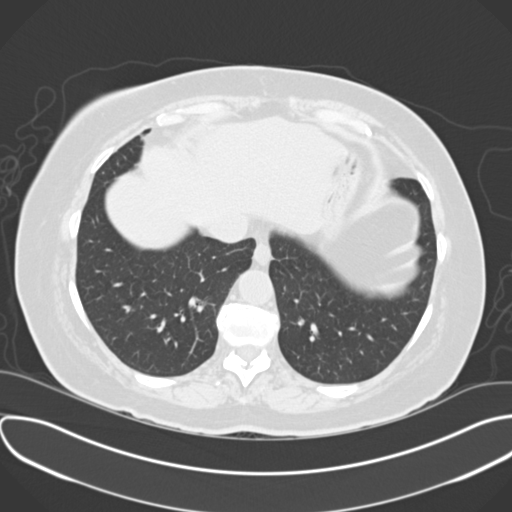
[im 133/144  lung]
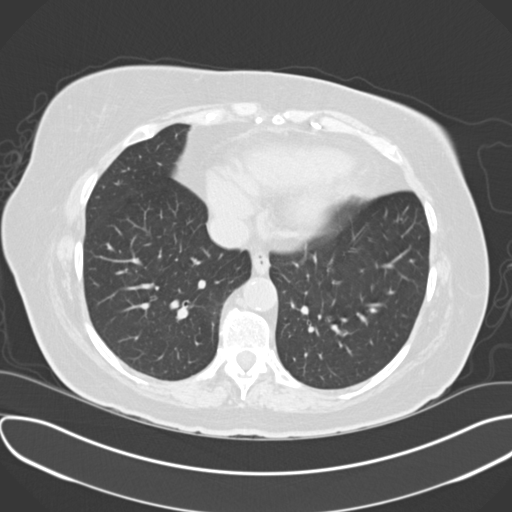
[im 138/144  soft-tissue]
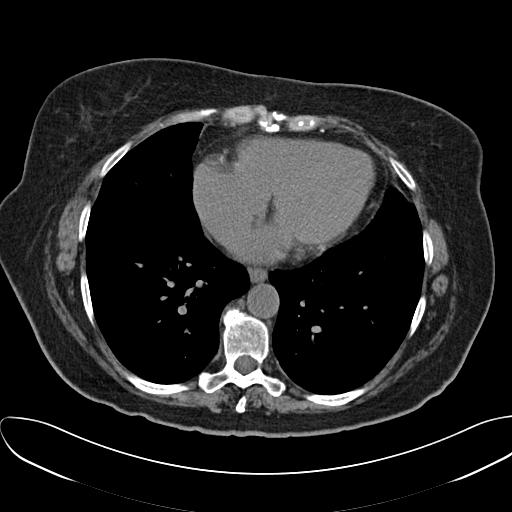
[im 138/144  lung]
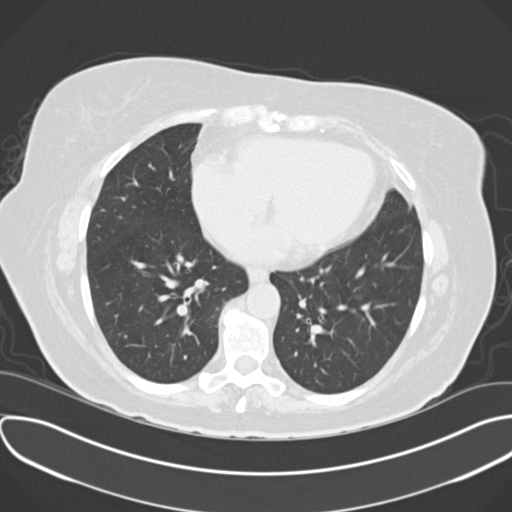

[15 of 32 positions shown; findings below may reference images not displayed]

FINDINGS: The lung bases are clear. There is no pleural or pericardial effusions.

No renal, ureteral, or bladder calculi. No obstructive uropathy. No
perinephric stranding is seen. The kidneys are symmetric in size without
evidence for exophytic mass. The bladder is unremarkable.

The liver demonstrates no focal abnormality. The gallbladder is
unremarkable. The spleen demonstrates no focal abnormality. The adrenal
glands and pancreas are normal.

The unopacified stomach, duodenum, small intestine, and large intestine are
unremarkable, but evaluation is limited by lack of oral contrast. There is
diverticulosis without evidence of diverticulitis. No normal nor abnormal
appendix is visualized. There is no right lower quadrant free fluid or
inflammatory change. There is no pneumoperitoneum, pneumatosis, or portal
venous gas. There is no abdominal or pelvic free fluid. There is no
lymphadenopathy.

The abdominal aorta is normal in caliber with atherosclerosis.

The osseous structures are unremarkable.
IMPRESSION: 1. No urolithiasis or obstructive uropathy.

## 2011-10-17 ENCOUNTER — Observation Stay: Payer: Self-pay | Admitting: Internal Medicine

## 2011-10-17 LAB — COMPREHENSIVE METABOLIC PANEL
Alkaline Phosphatase: 133 U/L (ref 50–136)
Anion Gap: 7 (ref 7–16)
BUN: 19 mg/dL — ABNORMAL HIGH (ref 7–18)
Bilirubin,Total: 0.2 mg/dL (ref 0.2–1.0)
Co2: 27 mmol/L (ref 21–32)
Creatinine: 0.7 mg/dL (ref 0.60–1.30)
EGFR (Non-African Amer.): >60
Glucose: 155 mg/dL — ABNORMAL HIGH (ref 65–99)
Osmolality: 294 (ref 275–301)
Potassium: 4 mmol/L (ref 3.5–5.1)
SGPT (ALT): 23 U/L (ref 12–78)
Sodium: 145 mmol/L (ref 136–145)

## 2011-10-17 LAB — URINALYSIS, COMPLETE
Bilirubin,UR: NEGATIVE
Blood: NEGATIVE
Ketone: NEGATIVE
Nitrite: NEGATIVE
Ph: 5 (ref 4.5–8.0)
Protein: NEGATIVE
RBC,UR: <1 /HPF (ref 0–5)
Squamous Epithelial: 1

## 2011-10-17 LAB — PROTIME-INR
INR: 0.9
Prothrombin Time: 12.1 secs (ref 11.5–14.7)

## 2011-10-17 LAB — CBC WITH DIFFERENTIAL/PLATELET
Basophil %: 1.2 %
Eosinophil #: 0.1 10*3/uL (ref 0.0–0.7)
Eosinophil %: 0.8 %
Lymphocyte #: 2.2 10*3/uL (ref 1.0–3.6)
MCH: 28.4 pg (ref 26.0–34.0)
MCHC: 33.5 g/dL (ref 32.0–36.0)
Monocyte #: 0.5 x10 3/mm (ref 0.2–0.9)
Monocyte %: 5.8 %
Neutrophil %: 68.5 %
Platelet: 249 10*3/uL (ref 150–440)
RBC: 4.4 10*6/uL (ref 3.80–5.20)
RDW: 13.8 % (ref 11.5–14.5)
WBC: 9.2 10*3/uL (ref 3.6–11.0)

## 2011-10-17 LAB — TROPONIN I
Troponin-I: <0.02 ng/mL
Troponin-I: <0.02 ng/mL
Troponin-I: <0.02 ng/mL

## 2011-10-17 LAB — CK TOTAL AND CKMB (NOT AT ARMC)
CK, Total: 83 U/L (ref 21–215)
CK-MB: 1.1 ng/mL (ref 0.5–3.6)

## 2011-10-17 LAB — HEMOGLOBIN A1C: Hemoglobin A1C: 8.1 % — ABNORMAL HIGH (ref 4.2–6.3)

## 2011-10-18 LAB — CBC WITH DIFFERENTIAL/PLATELET
Basophil #: 0.1 x10 3/mm 3
Basophil %: 1.5 %
Eosinophil #: 0.1 x10 3/mm 3
Eosinophil %: 1.5 %
HCT: 35.6 %
HGB: 12 g/dL
Lymphocyte %: 42.1 %
Lymphs Abs: 3.2 x10 3/mm 3
MCH: 28.6 pg
MCHC: 33.6 g/dL
MCV: 85 fL
Monocyte #: 0.5 "x10 3/mm "
Monocyte %: 7.1 %
Neutrophil #: 3.6 x10 3/mm 3
Neutrophil %: 47.8 %
Platelet: 244 x10 3/mm 3
RBC: 4.18 X10 6/mm 3
RDW: 13.6 %
WBC: 7.5 x10 3/mm 3

## 2011-10-18 LAB — COMPREHENSIVE METABOLIC PANEL
Alkaline Phosphatase: 114 U/L (ref 50–136)
Anion Gap: 11 (ref 7–16)
BUN: 15 mg/dL (ref 7–18)
Calcium, Total: 8.7 mg/dL (ref 8.5–10.1)
Co2: 24 mmol/L (ref 21–32)
EGFR (African American): >60
Glucose: 102 mg/dL — ABNORMAL HIGH (ref 65–99)
Osmolality: 290 (ref 275–301)
SGOT(AST): 23 U/L (ref 15–37)
Sodium: 145 mmol/L (ref 136–145)
Total Protein: 6.6 g/dL (ref 6.4–8.2)

## 2011-11-20 IMAGING — CT CT HEAD WITHOUT CONTRAST
1 series · 16 of 27 positions shown, 20 images · non-contrast
Comparison: none

REASON FOR EXAM: recent minor trauma, excl bleed.
COMMENTS:

PROCEDURE:     CT  - CT HEAD WITHOUT CONTRAST  - September 07, 2009  [DATE]
RESULT:     History: Trauma.
Comparison Study: 03/21/2008 head CT.

[Series 2: soft tissue · axial · 0.40mm/px · z∈[+408,+528]mm · 16 of 27 slices shown, 20 images]
[im 2/27  brain]
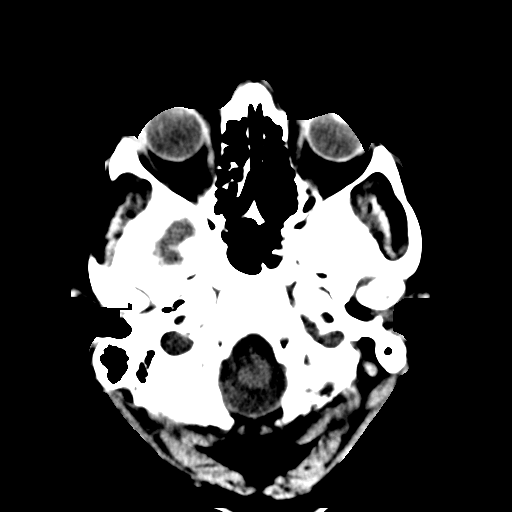
[im 2/27  bone]
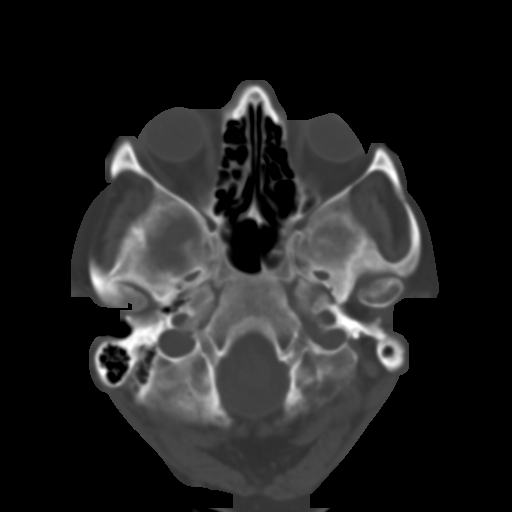
[im 4/27  brain]
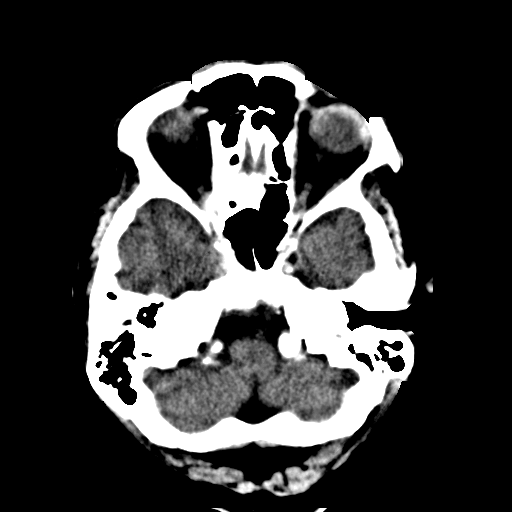
[im 5/27  brain]
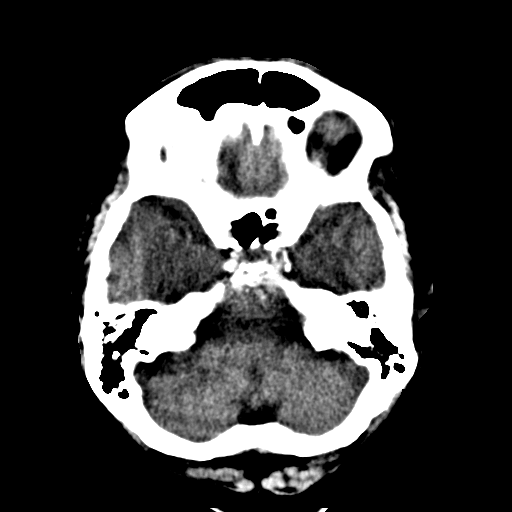
[im 7/27  brain]
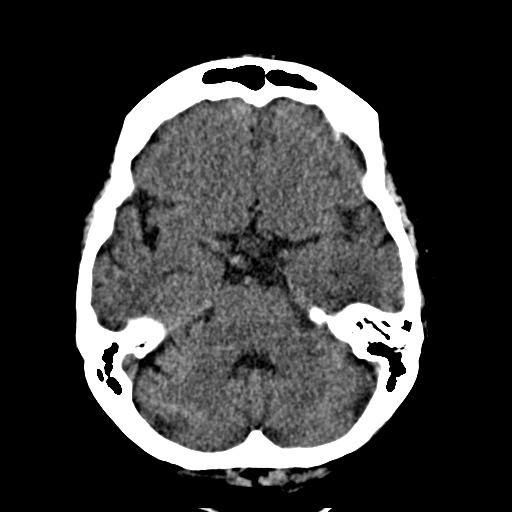
[im 9/27  brain]
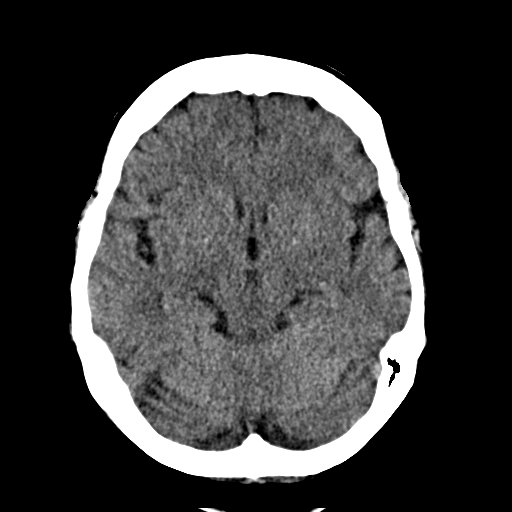
[im 9/27  bone]
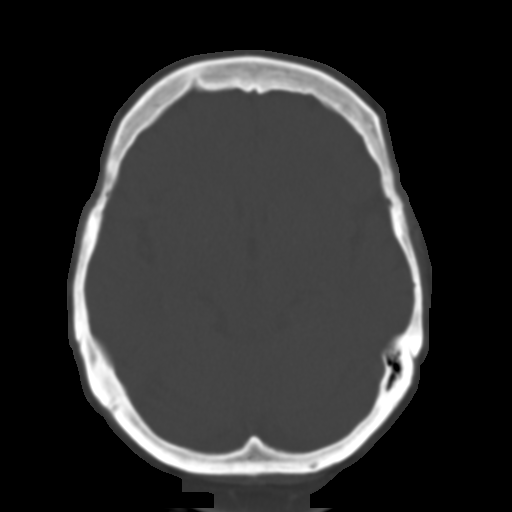
[im 10/27  brain]
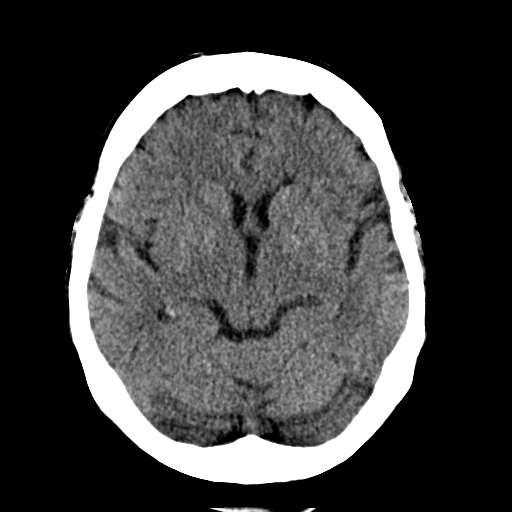
[im 12/27  brain]
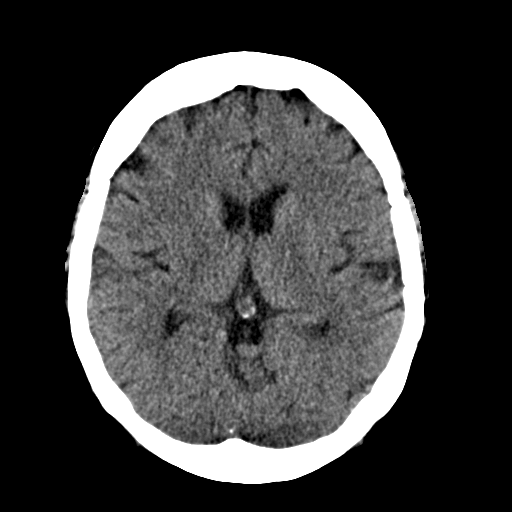
[im 13/27  brain]
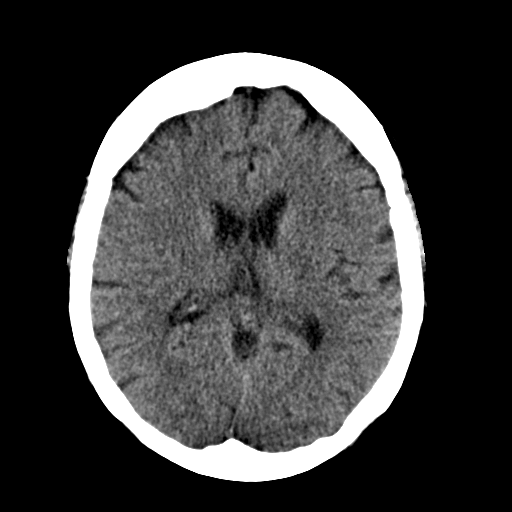
[im 15/27  brain]
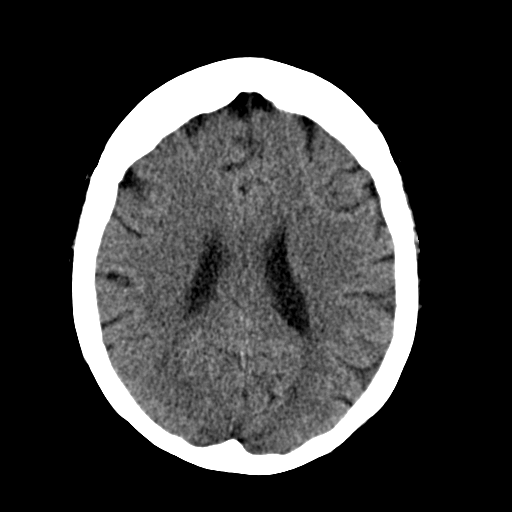
[im 15/27  bone]
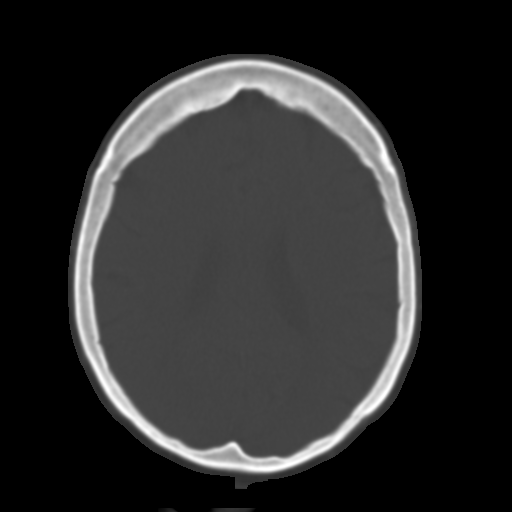
[im 16/27  brain]
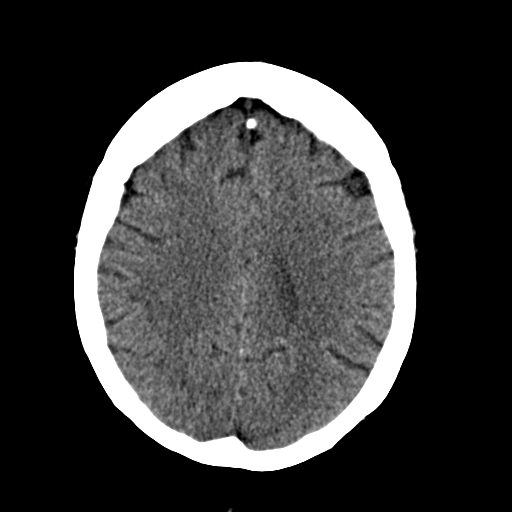
[im 18/27  brain]
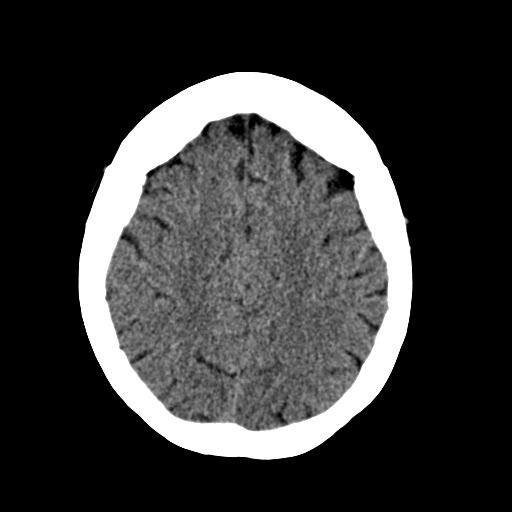
[im 19/27  brain]
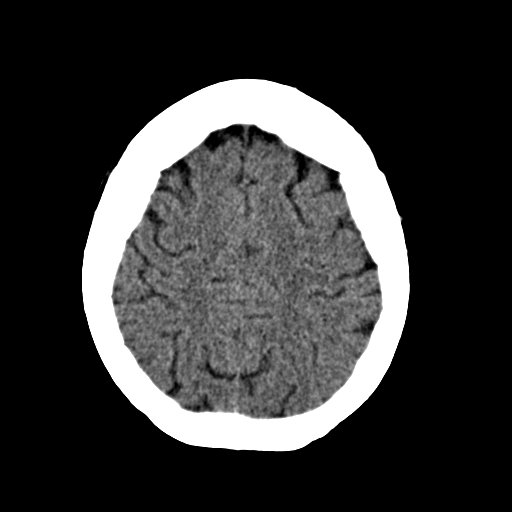
[im 21/27  brain]
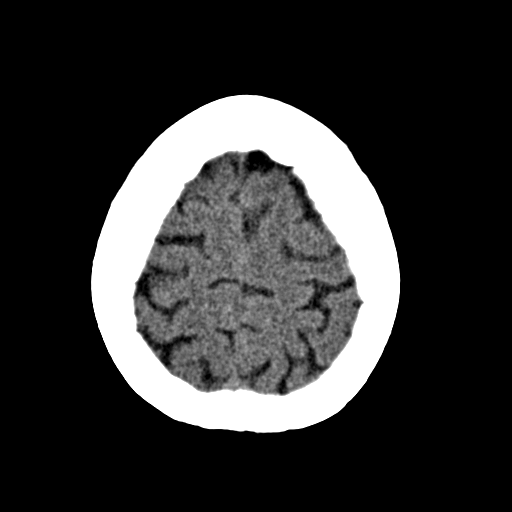
[im 21/27  bone]
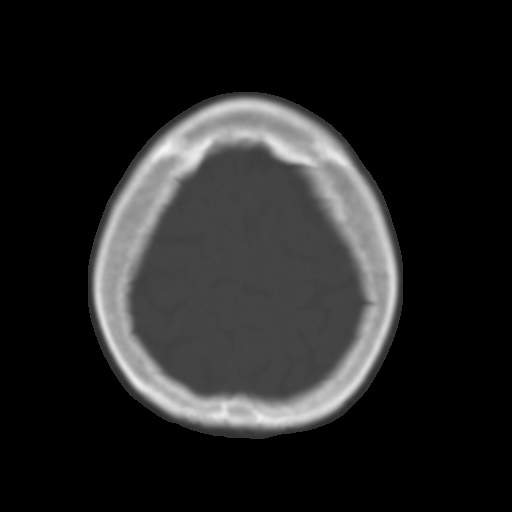
[im 23/27  brain]
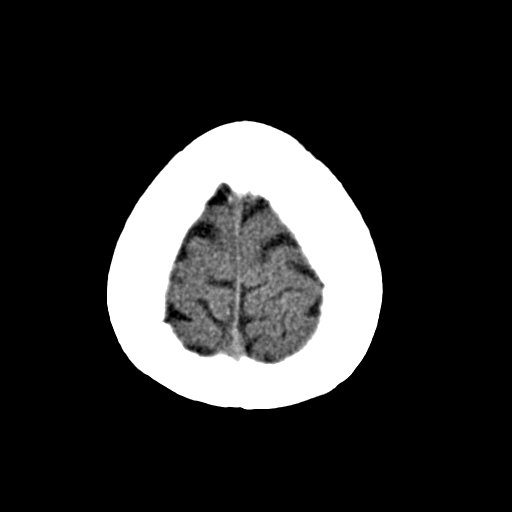
[im 24/27  brain]
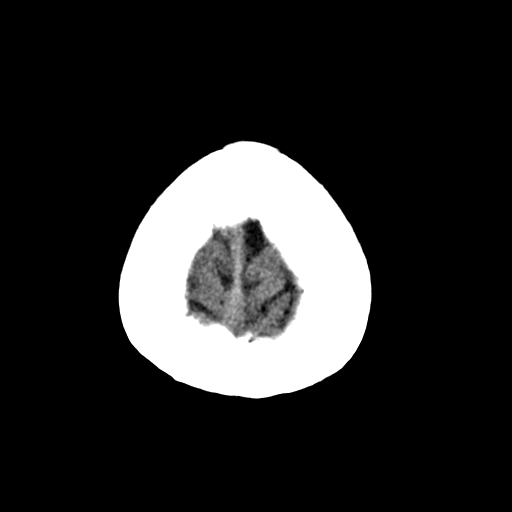
[im 26/27  brain]
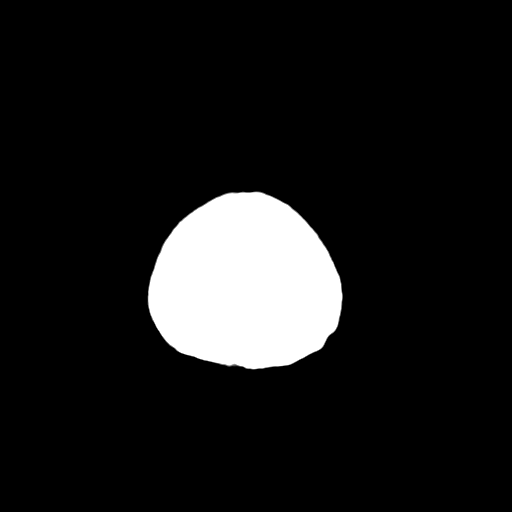

[16 of 27 positions shown; findings below may reference images not displayed]

FINDINGS: No mass lesion. No hydrocephalus. No hemorrhage. No bony
abnormality.
IMPRESSION: No acute abnormality.

## 2011-11-21 ENCOUNTER — Emergency Department: Payer: Self-pay | Admitting: Emergency Medicine

## 2011-11-21 LAB — CBC
HCT: 36 % (ref 35.0–47.0)
HGB: 12 g/dL (ref 12.0–16.0)
MCH: 28.2 pg (ref 26.0–34.0)
RBC: 4.26 10*6/uL (ref 3.80–5.20)
RDW: 13.9 % (ref 11.5–14.5)
WBC: 9.5 10*3/uL (ref 3.6–11.0)

## 2011-11-21 LAB — COMPREHENSIVE METABOLIC PANEL
Albumin: 3.4 g/dL (ref 3.4–5.0)
Alkaline Phosphatase: 148 U/L — ABNORMAL HIGH (ref 50–136)
BUN: 24 mg/dL — ABNORMAL HIGH (ref 7–18)
Bilirubin,Total: 0.2 mg/dL (ref 0.2–1.0)
Calcium, Total: 8.7 mg/dL (ref 8.5–10.1)
Co2: 29 mmol/L (ref 21–32)
Creatinine: 0.63 mg/dL (ref 0.60–1.30)
EGFR (Non-African Amer.): >60
Glucose: 176 mg/dL — ABNORMAL HIGH (ref 65–99)
SGOT(AST): 21 U/L (ref 15–37)
SGPT (ALT): 39 U/L (ref 12–78)
Sodium: 142 mmol/L (ref 136–145)
Total Protein: 7.1 g/dL (ref 6.4–8.2)

## 2011-11-21 LAB — URINALYSIS, COMPLETE
Bilirubin,UR: NEGATIVE
Ketone: NEGATIVE
Nitrite: NEGATIVE
Ph: 5 (ref 4.5–8.0)
Protein: 30
Specific Gravity: 1.024 (ref 1.003–1.030)
WBC UR: 2 /HPF (ref 0–5)

## 2011-12-07 ENCOUNTER — Emergency Department: Payer: Self-pay | Admitting: Emergency Medicine

## 2011-12-07 LAB — BASIC METABOLIC PANEL
Anion Gap: 8 (ref 7–16)
Calcium, Total: 9.2 mg/dL (ref 8.5–10.1)
Chloride: 108 mmol/L — ABNORMAL HIGH (ref 98–107)
Co2: 28 mmol/L (ref 21–32)
Creatinine: 0.56 mg/dL — ABNORMAL LOW (ref 0.60–1.30)
Potassium: 3.9 mmol/L (ref 3.5–5.1)
Sodium: 144 mmol/L (ref 136–145)

## 2011-12-07 LAB — CBC
HCT: 39.4 % (ref 35.0–47.0)
HGB: 12.8 g/dL (ref 12.0–16.0)
MCH: 27.3 pg (ref 26.0–34.0)
MCHC: 32.4 g/dL (ref 32.0–36.0)
MCV: 84 fL (ref 80–100)
Platelet: 263 10*3/uL (ref 150–440)
RBC: 4.68 10*6/uL (ref 3.80–5.20)

## 2011-12-07 LAB — URINALYSIS, COMPLETE
Bacteria: NONE SEEN
Ph: 5 (ref 4.5–8.0)
RBC,UR: 1 /HPF (ref 0–5)
Specific Gravity: 1.015 (ref 1.003–1.030)
WBC UR: 2 /HPF (ref 0–5)

## 2011-12-07 LAB — CK TOTAL AND CKMB (NOT AT ARMC): CK-MB: 0.9 ng/mL (ref 0.5–3.6)

## 2011-12-31 IMAGING — CR DG ABDOMEN 3V
1 series · 4 of 4 positions shown · non-contrast
Comparison: none

REASON FOR EXAM: low chest and abd pain
COMMENTS:   May transport without cardiac monitor

[Series 1: view not recorded · 0.17mm/px · 4 of 4 slices shown]
[im 1/4]
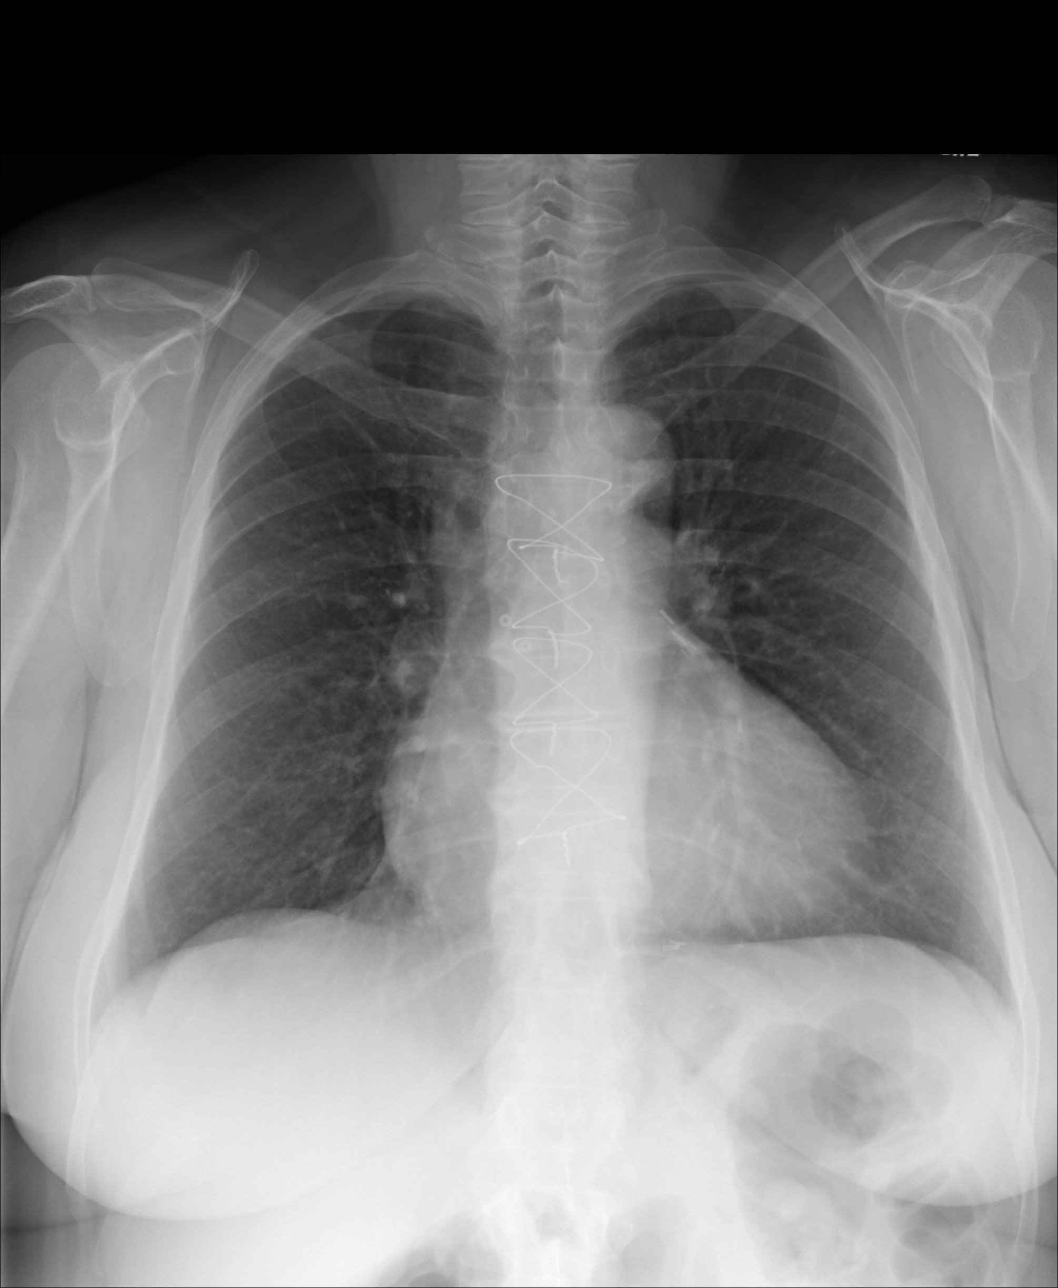
[im 2/4]
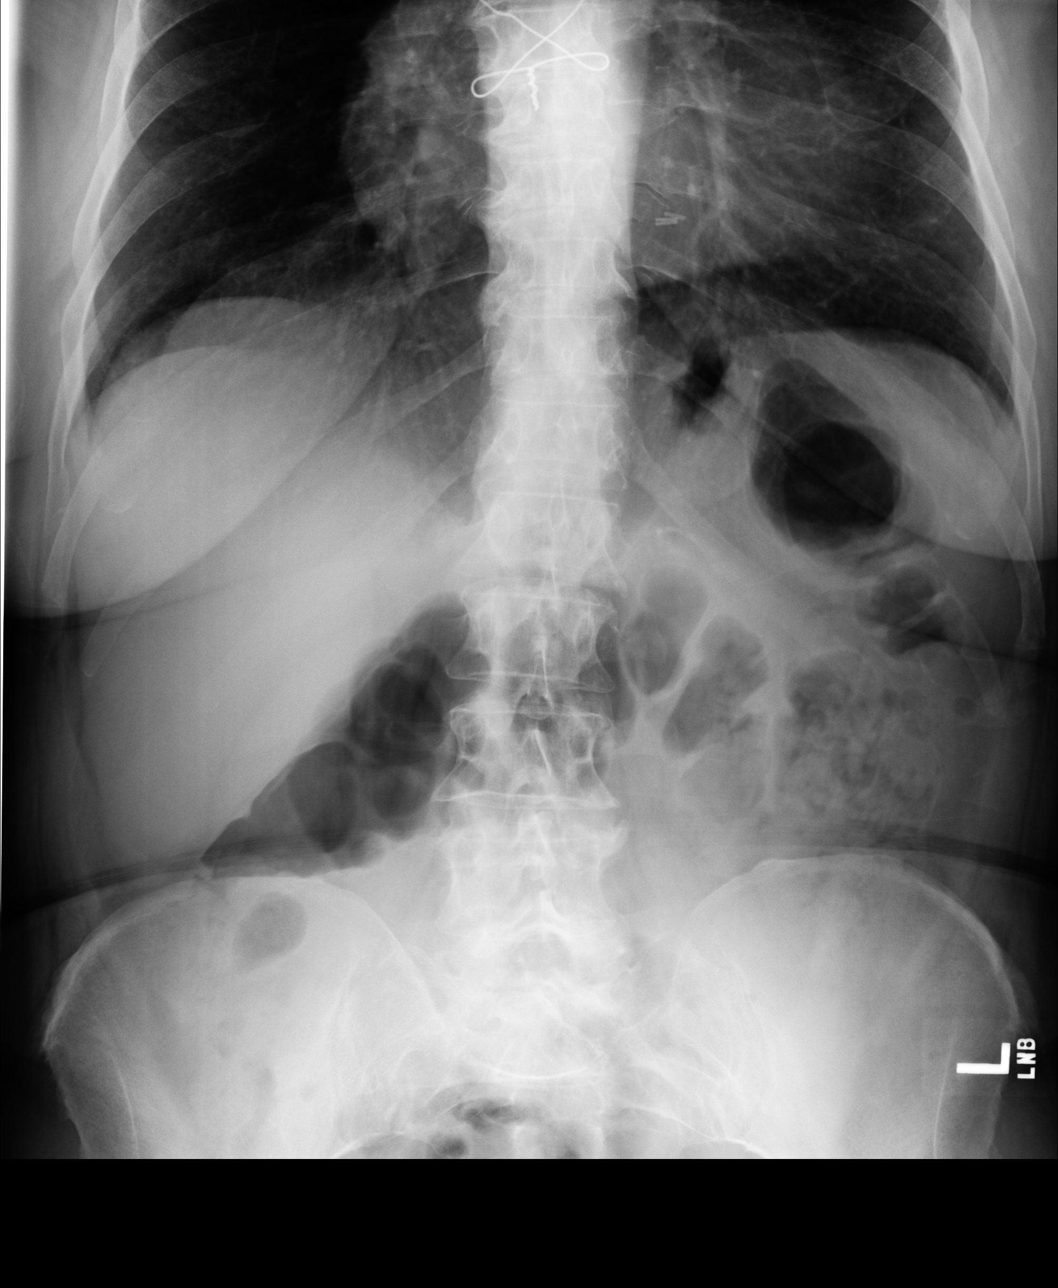
[im 3/4]
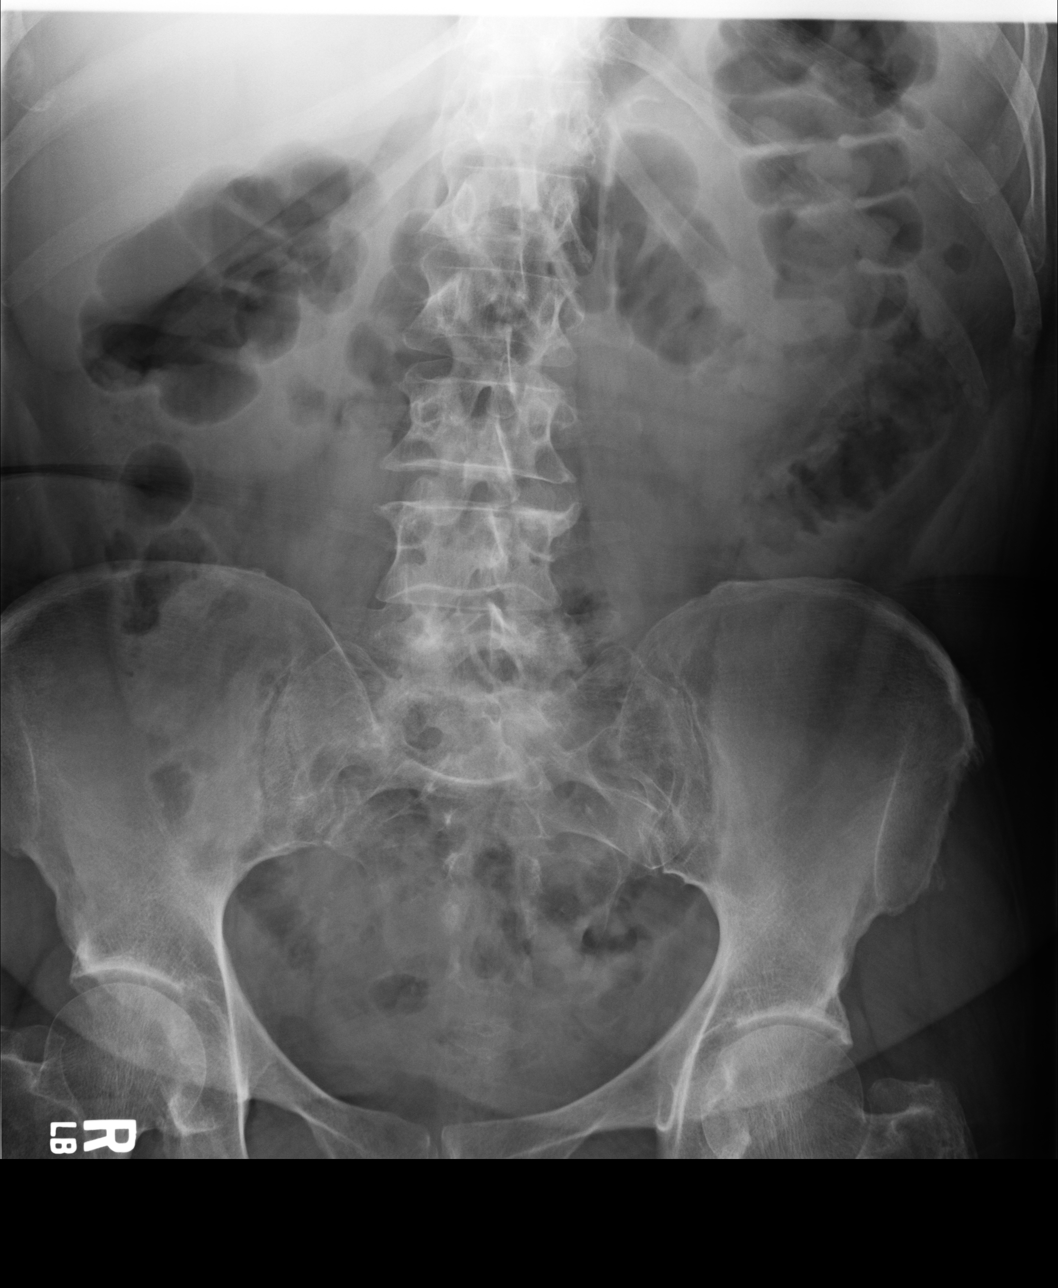
[im 4/4]
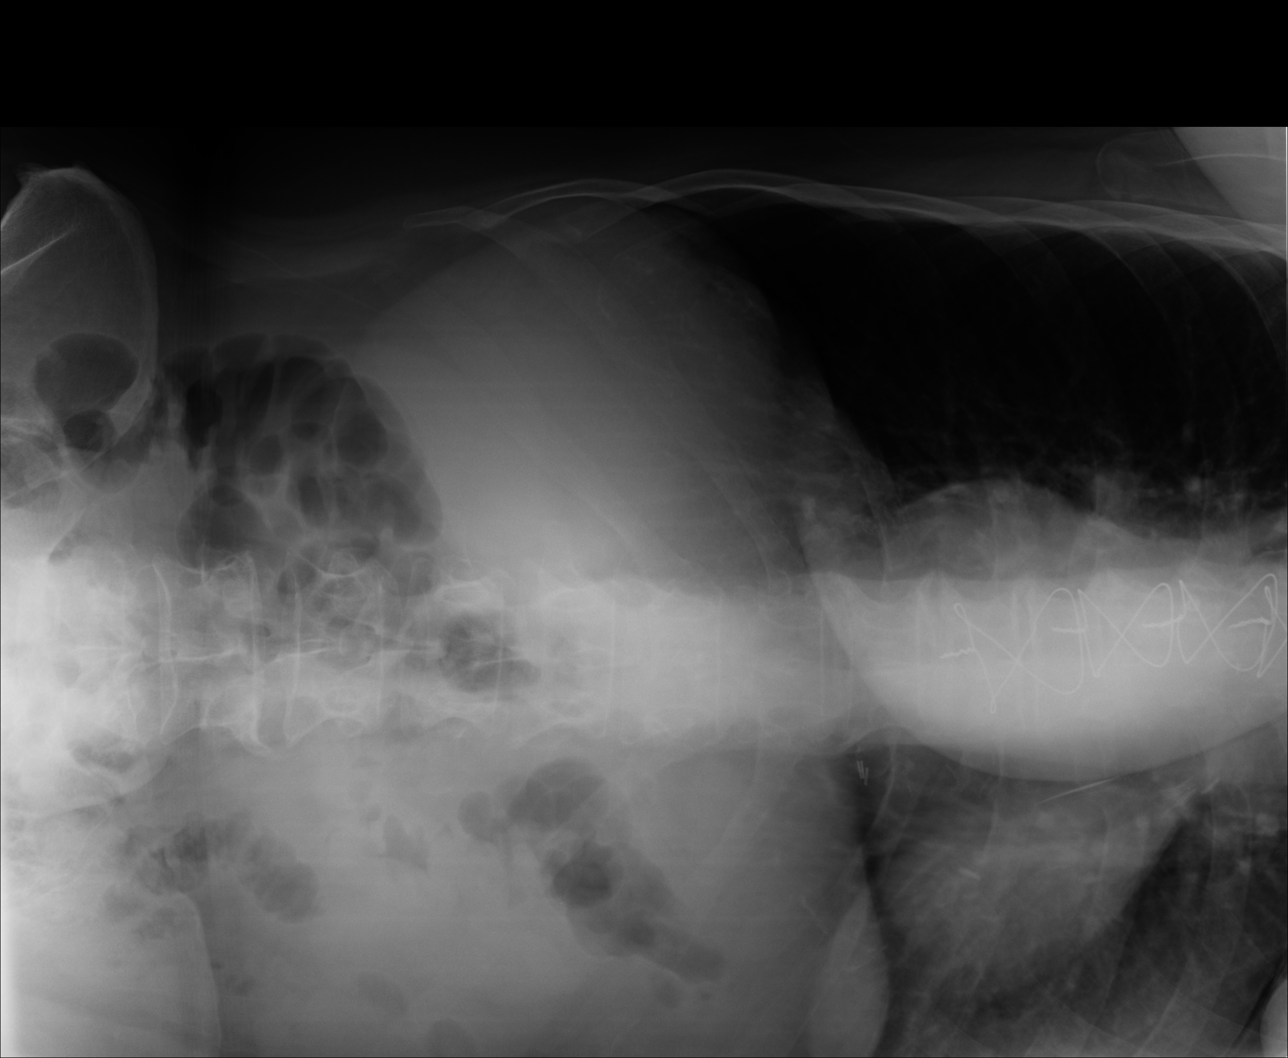

[4 of 4 positions shown; findings below may reference images not displayed]

PROCEDURE:     DXR - DXR ABDOMEN 3-WAY (INCL PA CXR)  - October 18, 2009 [DATE]

RESULT:     Sternotomy wires are present. The cardiac silhouette is
borderline enlarged. The lungs appear clear. The bowel gas pattern is
nonspecific with air and fecal material scattered through the colon to the
rectum and air within loops of small bowel without evidence of free air or
significant air/fluid levels.
IMPRESSION: Cardiomegaly. No evidence of bowel obstruction or
perforation at this time.

## 2012-01-01 ENCOUNTER — Emergency Department: Payer: Self-pay | Admitting: Emergency Medicine

## 2012-01-01 LAB — COMPREHENSIVE METABOLIC PANEL
Albumin: 3.9 g/dL (ref 3.4–5.0)
Alkaline Phosphatase: 150 U/L — ABNORMAL HIGH (ref 50–136)
Anion Gap: 8 (ref 7–16)
BUN: 24 mg/dL — ABNORMAL HIGH (ref 7–18)
Bilirubin,Total: 0.2 mg/dL (ref 0.2–1.0)
Calcium, Total: 9.3 mg/dL (ref 8.5–10.1)
Co2: 29 mmol/L (ref 21–32)
EGFR (Non-African Amer.): >60
Glucose: 215 mg/dL — ABNORMAL HIGH (ref 65–99)
SGOT(AST): 14 U/L — ABNORMAL LOW (ref 15–37)

## 2012-01-01 LAB — URINALYSIS, COMPLETE
Bacteria: NONE SEEN
Bilirubin,UR: NEGATIVE
Blood: NEGATIVE
Glucose,UR: NEGATIVE mg/dL (ref 0–75)
Nitrite: NEGATIVE
Ph: 5 (ref 4.5–8.0)
Protein: NEGATIVE
Specific Gravity: 1.025 (ref 1.003–1.030)

## 2012-01-01 LAB — TROPONIN I: Troponin-I: <0.02 ng/mL

## 2012-01-01 LAB — CBC
HCT: 39.6 % (ref 35.0–47.0)
HGB: 13 g/dL (ref 12.0–16.0)
MCV: 86 fL (ref 80–100)
RBC: 4.62 10*6/uL (ref 3.80–5.20)
WBC: 10.3 10*3/uL (ref 3.6–11.0)

## 2012-01-01 LAB — CK TOTAL AND CKMB (NOT AT ARMC): CK, Total: 97 U/L (ref 21–215)

## 2012-01-18 ENCOUNTER — Emergency Department: Payer: Self-pay | Admitting: Emergency Medicine

## 2012-01-18 LAB — CBC
HGB: 13.4 g/dL (ref 12.0–16.0)
MCV: 85 fL (ref 80–100)
Platelet: 250 10*3/uL (ref 150–440)
RDW: 14.3 % (ref 11.5–14.5)
WBC: 10.6 10*3/uL (ref 3.6–11.0)

## 2012-01-18 LAB — BASIC METABOLIC PANEL
Calcium, Total: 9.1 mg/dL (ref 8.5–10.1)
Chloride: 106 mmol/L (ref 98–107)
EGFR (Non-African Amer.): >60
Glucose: 130 mg/dL — ABNORMAL HIGH (ref 65–99)
Osmolality: 286 (ref 275–301)
Potassium: 3.9 mmol/L (ref 3.5–5.1)

## 2012-01-18 LAB — CK TOTAL AND CKMB (NOT AT ARMC)
CK, Total: 82 U/L (ref 21–215)
CK-MB: 1.3 ng/mL (ref 0.5–3.6)

## 2012-01-18 LAB — TROPONIN I: Troponin-I: <0.02 ng/mL

## 2012-03-01 ENCOUNTER — Emergency Department: Payer: Self-pay | Admitting: Emergency Medicine

## 2012-03-01 LAB — URINALYSIS, COMPLETE
Bacteria: NONE SEEN
Bilirubin,UR: NEGATIVE
Blood: NEGATIVE
Glucose,UR: NEGATIVE mg/dL (ref 0–75)
Ph: 5 (ref 4.5–8.0)
RBC,UR: 1 /HPF (ref 0–5)
Specific Gravity: 1.024 (ref 1.003–1.030)
Squamous Epithelial: 2

## 2012-03-01 LAB — COMPREHENSIVE METABOLIC PANEL
Albumin: 3.7 g/dL (ref 3.4–5.0)
Anion Gap: 8 (ref 7–16)
BUN: 24 mg/dL — ABNORMAL HIGH (ref 7–18)
Calcium, Total: 8.9 mg/dL (ref 8.5–10.1)
Chloride: 106 mmol/L (ref 98–107)
EGFR (Non-African Amer.): >60
Osmolality: 288 (ref 275–301)
SGPT (ALT): 38 U/L (ref 12–78)

## 2012-03-01 LAB — CBC
HGB: 11.8 g/dL — ABNORMAL LOW (ref 12.0–16.0)
MCHC: 33.3 g/dL (ref 32.0–36.0)
Platelet: 268 10*3/uL (ref 150–440)
RBC: 4.2 10*6/uL (ref 3.80–5.20)
RDW: 14.4 % (ref 11.5–14.5)
WBC: 9.7 10*3/uL (ref 3.6–11.0)

## 2012-03-05 IMAGING — CR DG CHEST 2V
1 series · 2 of 2 positions shown · non-contrast
Comparison: none

REASON FOR EXAM: SOB
COMMENTS:

PROCEDURE:     DXR - DXR CHEST PA (OR AP) AND LATERAL  - December 22, 2009  [DATE]
RESULT:     Comparison: 06/06/2009

[Series 1: view not recorded · 0.17mm/px · 2 of 2 slices shown]
[im 1/2]
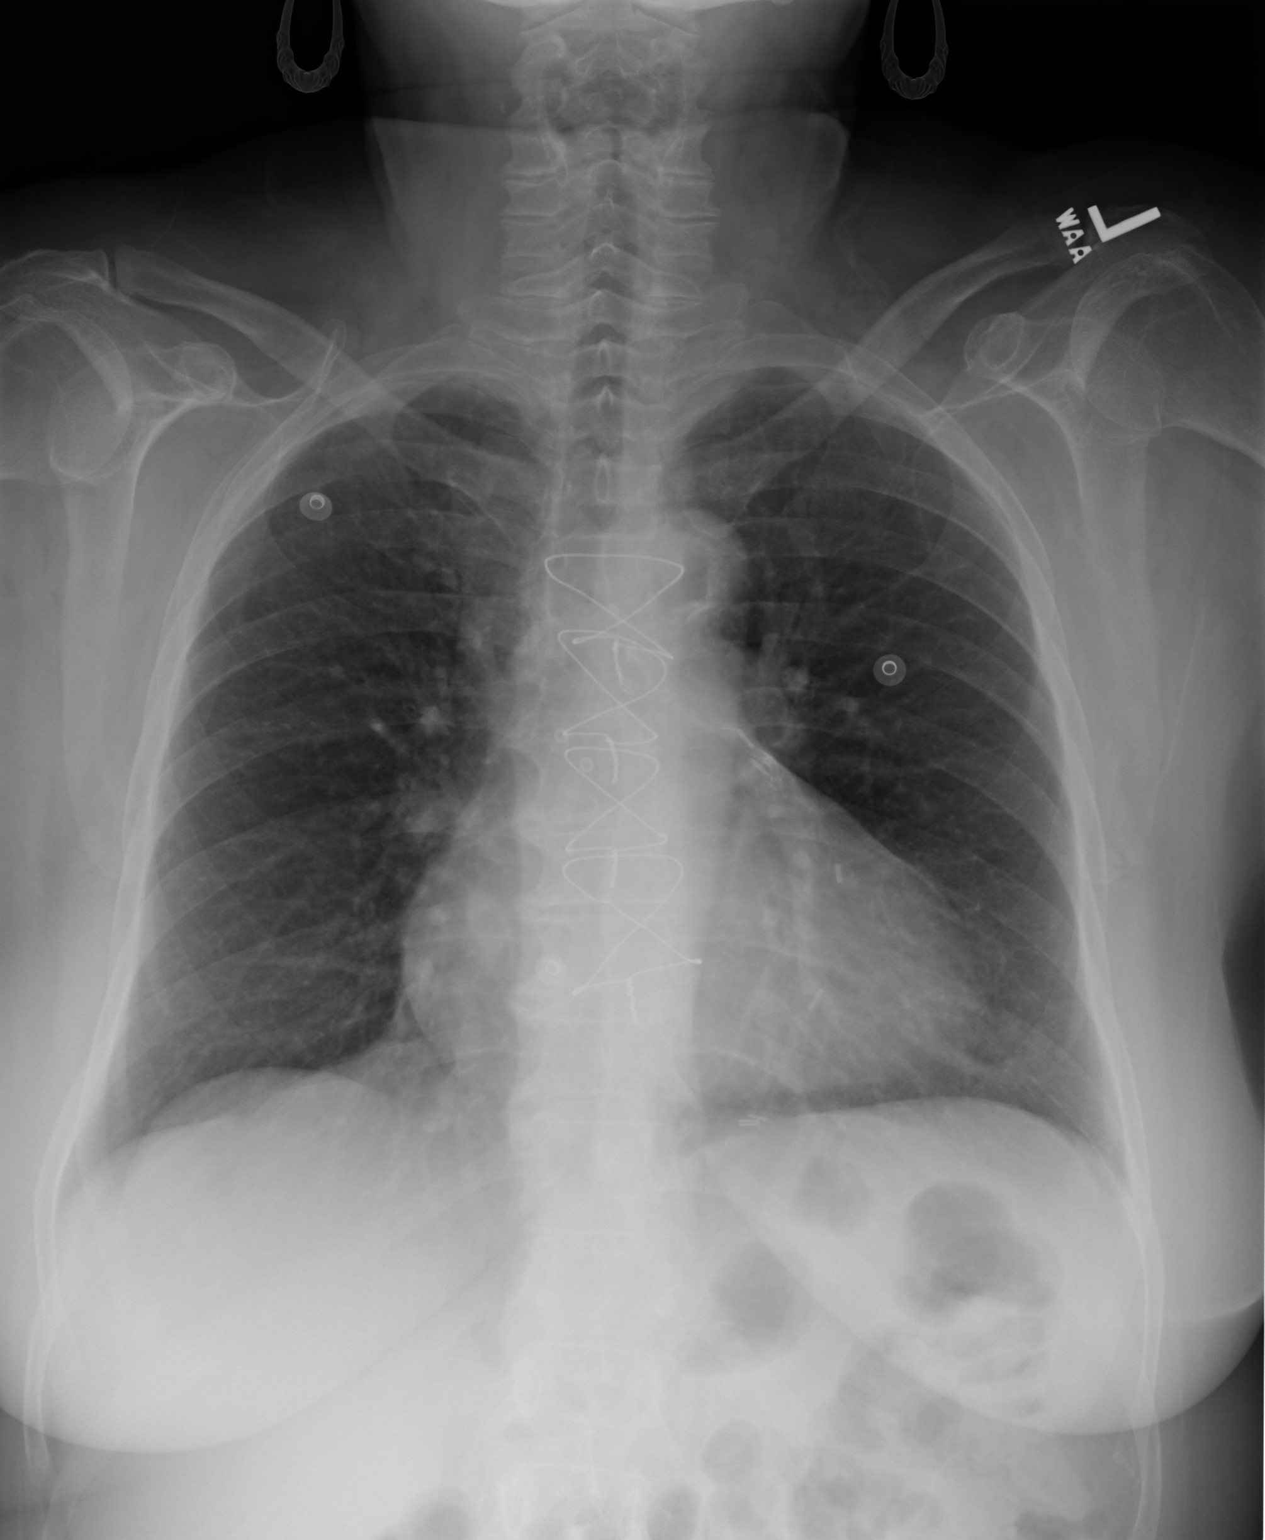
[im 2/2]
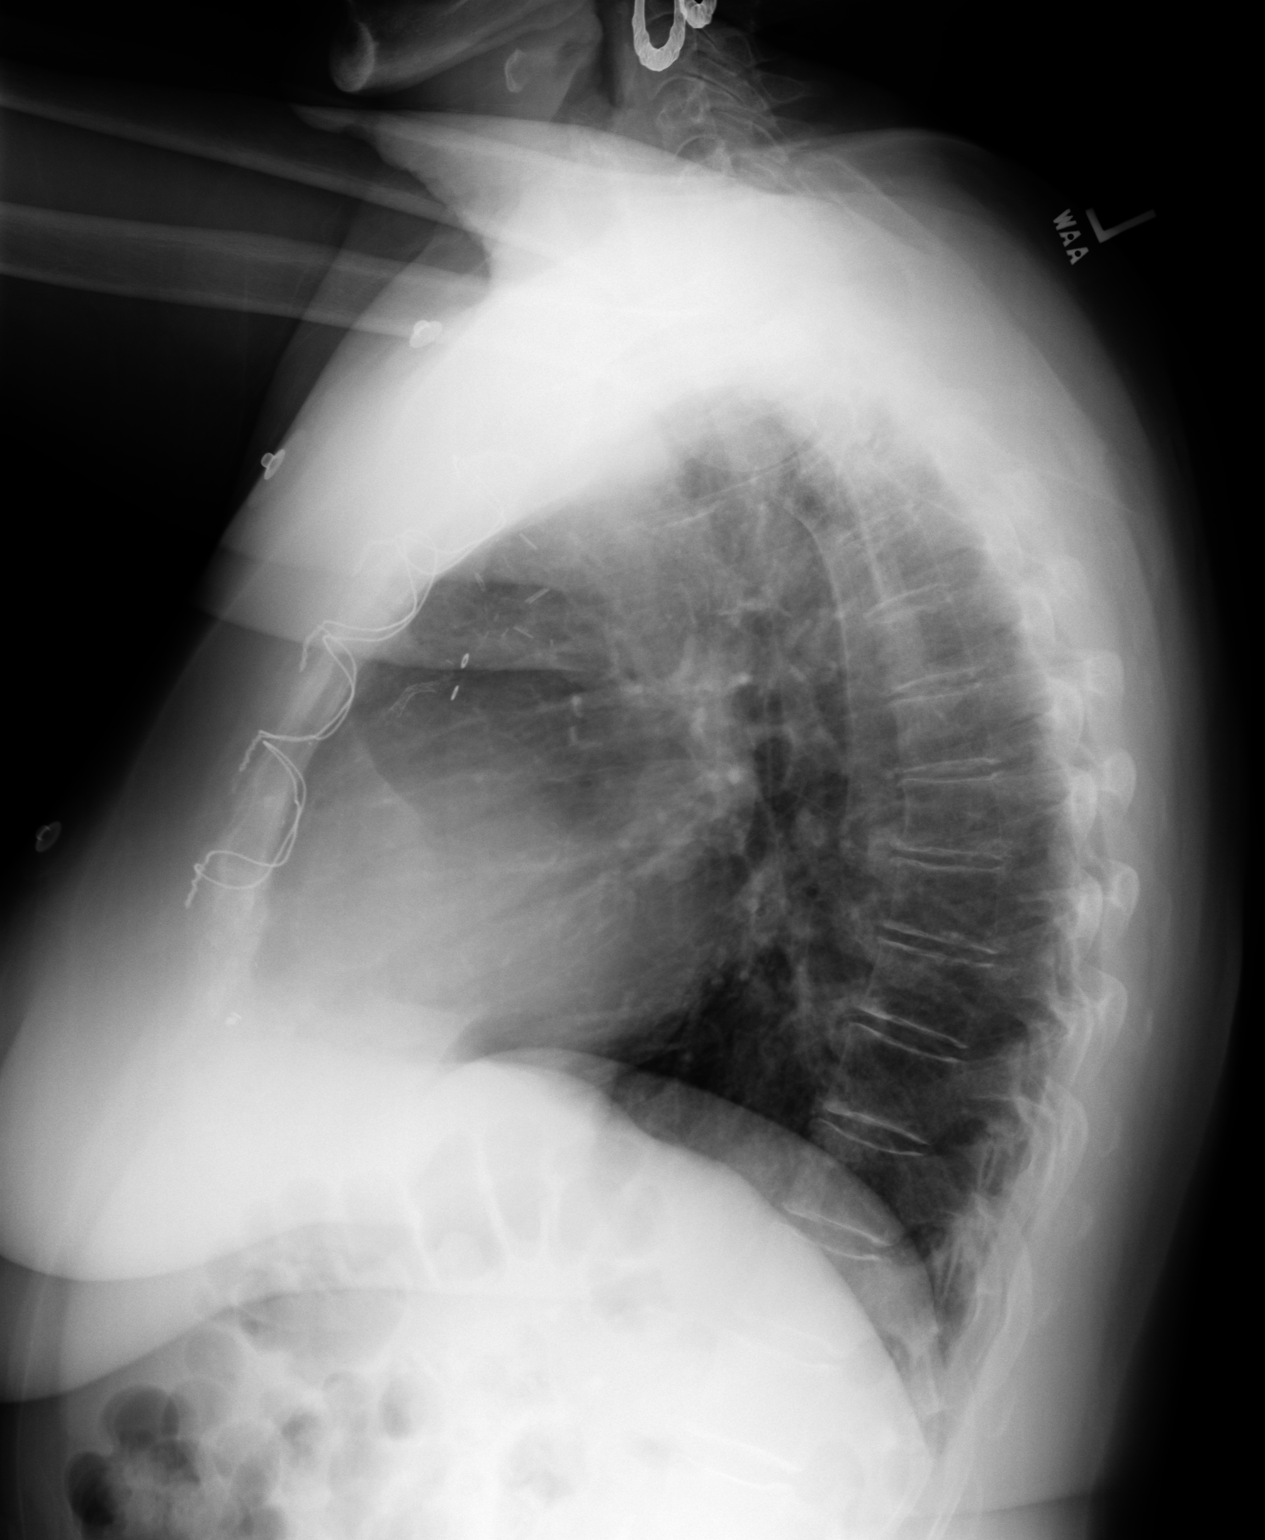

[2 of 2 positions shown; findings below may reference images not displayed]

FINDINGS: PA and lateral chest radiographs are provided.  There is no focal
parenchymal opacity, pleural effusion, or pneumothorax. The heart and
mediastinum are unremarkable. There is evidence of prior CABG. The osseous
structures are unremarkable.
IMPRESSION: No acute disease of the chest.

## 2012-03-15 ENCOUNTER — Emergency Department: Payer: Self-pay | Admitting: Emergency Medicine

## 2012-03-15 LAB — RAPID INFLUENZA A&B ANTIGENS

## 2012-03-17 ENCOUNTER — Inpatient Hospital Stay: Payer: Self-pay | Admitting: Family Medicine

## 2012-03-17 LAB — URINALYSIS, COMPLETE
Blood: NEGATIVE
Glucose,UR: NEGATIVE mg/dL (ref 0–75)
Ketone: NEGATIVE
Nitrite: NEGATIVE
Protein: NEGATIVE
RBC,UR: <1 /HPF (ref 0–5)
Specific Gravity: 1.009 (ref 1.003–1.030)
WBC UR: 6 /HPF (ref 0–5)

## 2012-03-17 LAB — PRO B NATRIURETIC PEPTIDE: B-Type Natriuretic Peptide: 660 pg/mL — ABNORMAL HIGH (ref 0–125)

## 2012-03-17 LAB — CBC
HCT: 38.9 % (ref 35.0–47.0)
MCH: 26.1 pg (ref 26.0–34.0)
RBC: 4.54 10*6/uL (ref 3.80–5.20)
RDW: 14.5 % (ref 11.5–14.5)

## 2012-03-17 LAB — BASIC METABOLIC PANEL
BUN: 23 mg/dL — ABNORMAL HIGH (ref 7–18)
Calcium, Total: 8.4 mg/dL — ABNORMAL LOW (ref 8.5–10.1)
Co2: 22 mmol/L (ref 21–32)
Creatinine: 0.7 mg/dL (ref 0.60–1.30)
Glucose: 240 mg/dL — ABNORMAL HIGH (ref 65–99)
Potassium: 3.7 mmol/L (ref 3.5–5.1)
Sodium: 143 mmol/L (ref 136–145)

## 2012-03-17 LAB — CK TOTAL AND CKMB (NOT AT ARMC)
CK, Total: 80 U/L (ref 21–215)
CK-MB: 1 ng/mL (ref 0.5–3.6)

## 2012-03-17 LAB — MAGNESIUM: Magnesium: 2 mg/dL

## 2012-03-17 LAB — TROPONIN I
Troponin-I: <0.02 ng/mL
Troponin-I: <0.02 ng/mL

## 2012-03-18 LAB — PROTIME-INR: Prothrombin Time: 13.7 secs (ref 11.5–14.7)

## 2012-03-18 LAB — CBC WITH DIFFERENTIAL/PLATELET
Basophil #: 0 10*3/uL (ref 0.0–0.1)
Eosinophil %: 2.7 %
Lymphocyte #: 2.7 10*3/uL (ref 1.0–3.6)
Lymphocyte %: 42.6 %
MCHC: 32.7 g/dL (ref 32.0–36.0)
Monocyte #: 0.5 x10 3/mm (ref 0.2–0.9)
Neutrophil #: 2.9 10*3/uL (ref 1.4–6.5)
Platelet: 198 10*3/uL (ref 150–440)
RBC: 4.08 10*6/uL (ref 3.80–5.20)
RDW: 14.2 % (ref 11.5–14.5)
WBC: 6.2 10*3/uL (ref 3.6–11.0)

## 2012-03-18 LAB — COMPREHENSIVE METABOLIC PANEL
Albumin: 3 g/dL — ABNORMAL LOW (ref 3.4–5.0)
Anion Gap: 7 (ref 7–16)
BUN: 16 mg/dL (ref 7–18)
Bilirubin,Total: 0.2 mg/dL (ref 0.2–1.0)
Co2: 25 mmol/L (ref 21–32)
Creatinine: 0.7 mg/dL (ref 0.60–1.30)
EGFR (African American): >60
Potassium: 4 mmol/L (ref 3.5–5.1)
SGOT(AST): 26 U/L (ref 15–37)
Total Protein: 6.1 g/dL — ABNORMAL LOW (ref 6.4–8.2)

## 2012-03-18 LAB — MAGNESIUM: Magnesium: 2.2 mg/dL

## 2012-03-18 LAB — LIPID PANEL: VLDL Cholesterol, Calc: 32 mg/dL (ref 5–40)

## 2012-03-28 ENCOUNTER — Emergency Department: Payer: Self-pay | Admitting: Emergency Medicine

## 2012-03-28 LAB — URINALYSIS, COMPLETE
Bacteria: NONE SEEN
Bilirubin,UR: NEGATIVE
Glucose,UR: NEGATIVE mg/dL
Ketone: NEGATIVE
Nitrite: NEGATIVE
Ph: 5
Protein: NEGATIVE
RBC,UR: NONE SEEN /HPF
Specific Gravity: 1.01
Squamous Epithelial: 1
WBC UR: 1 /HPF

## 2012-04-10 IMAGING — CR DG CHEST 2V
1 series · 2 of 2 positions shown · non-contrast
Comparison: none

REASON FOR EXAM: Chest Pain
COMMENTS:

[Series 1: view not recorded · 0.17mm/px · 2 of 2 slices shown]
[im 1/2]
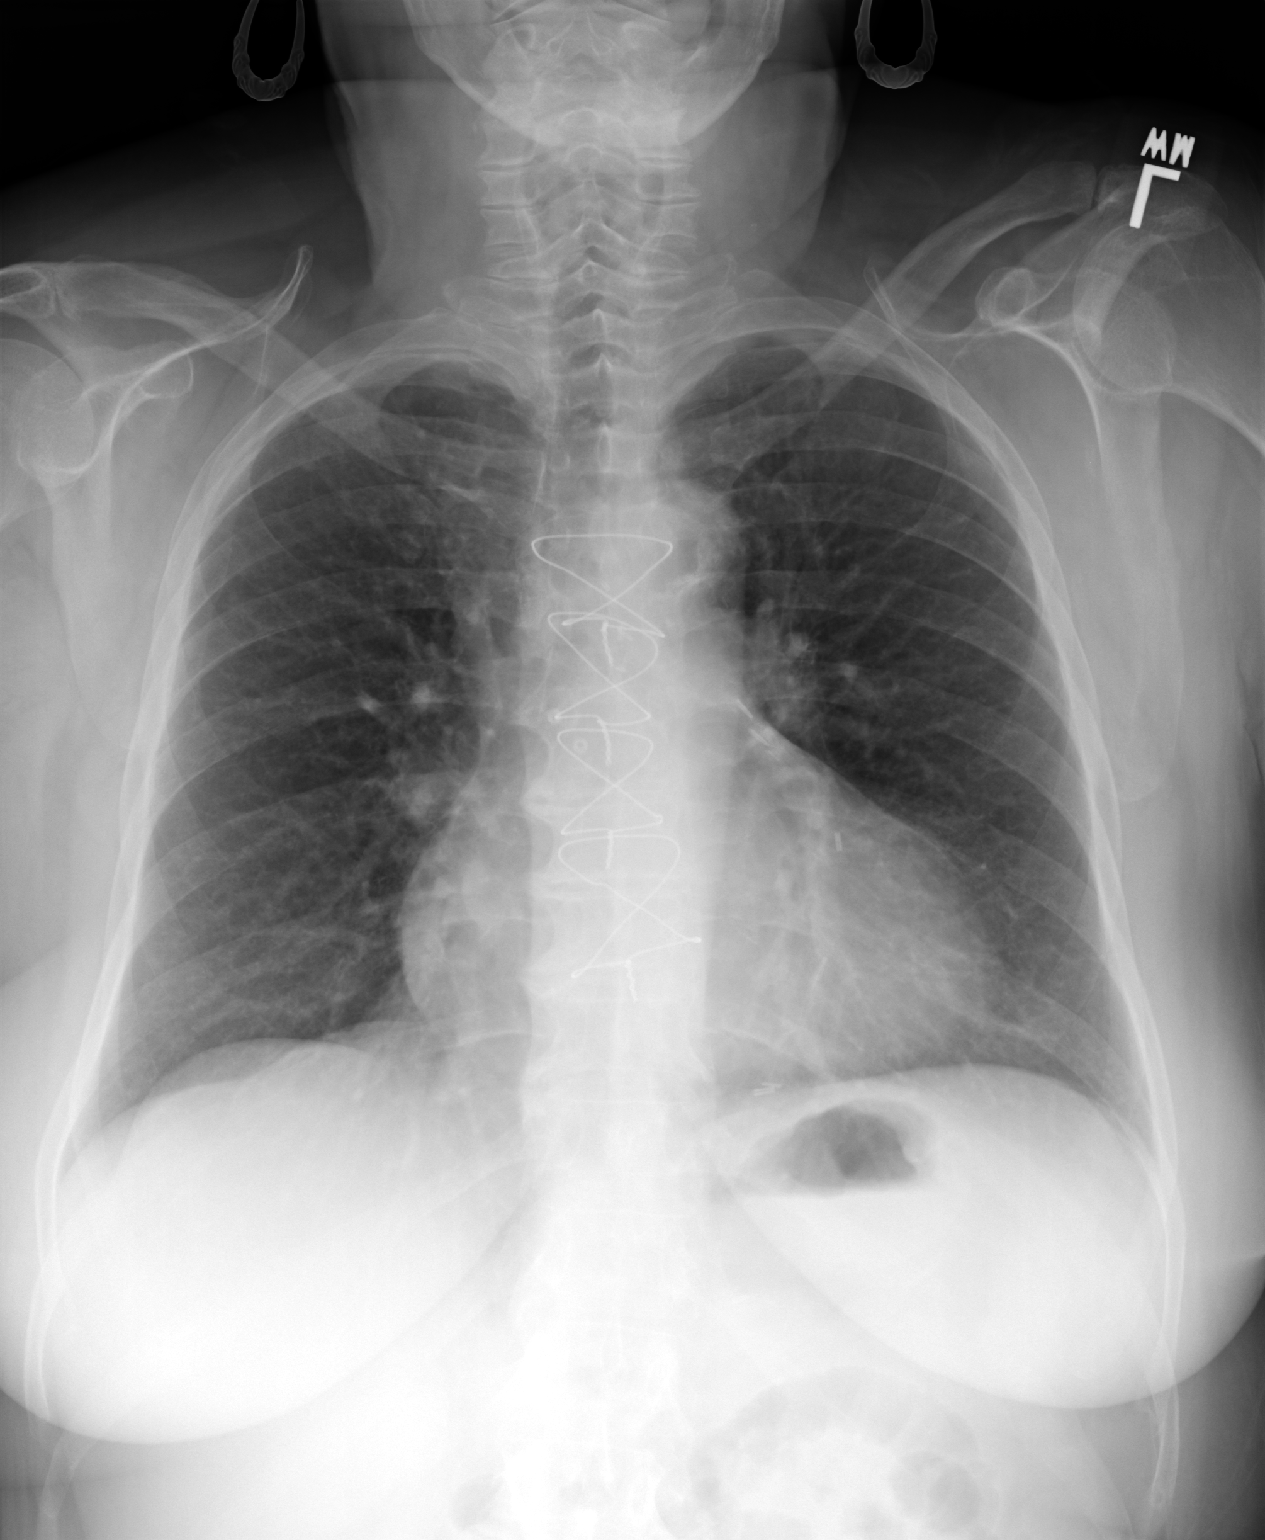
[im 2/2]
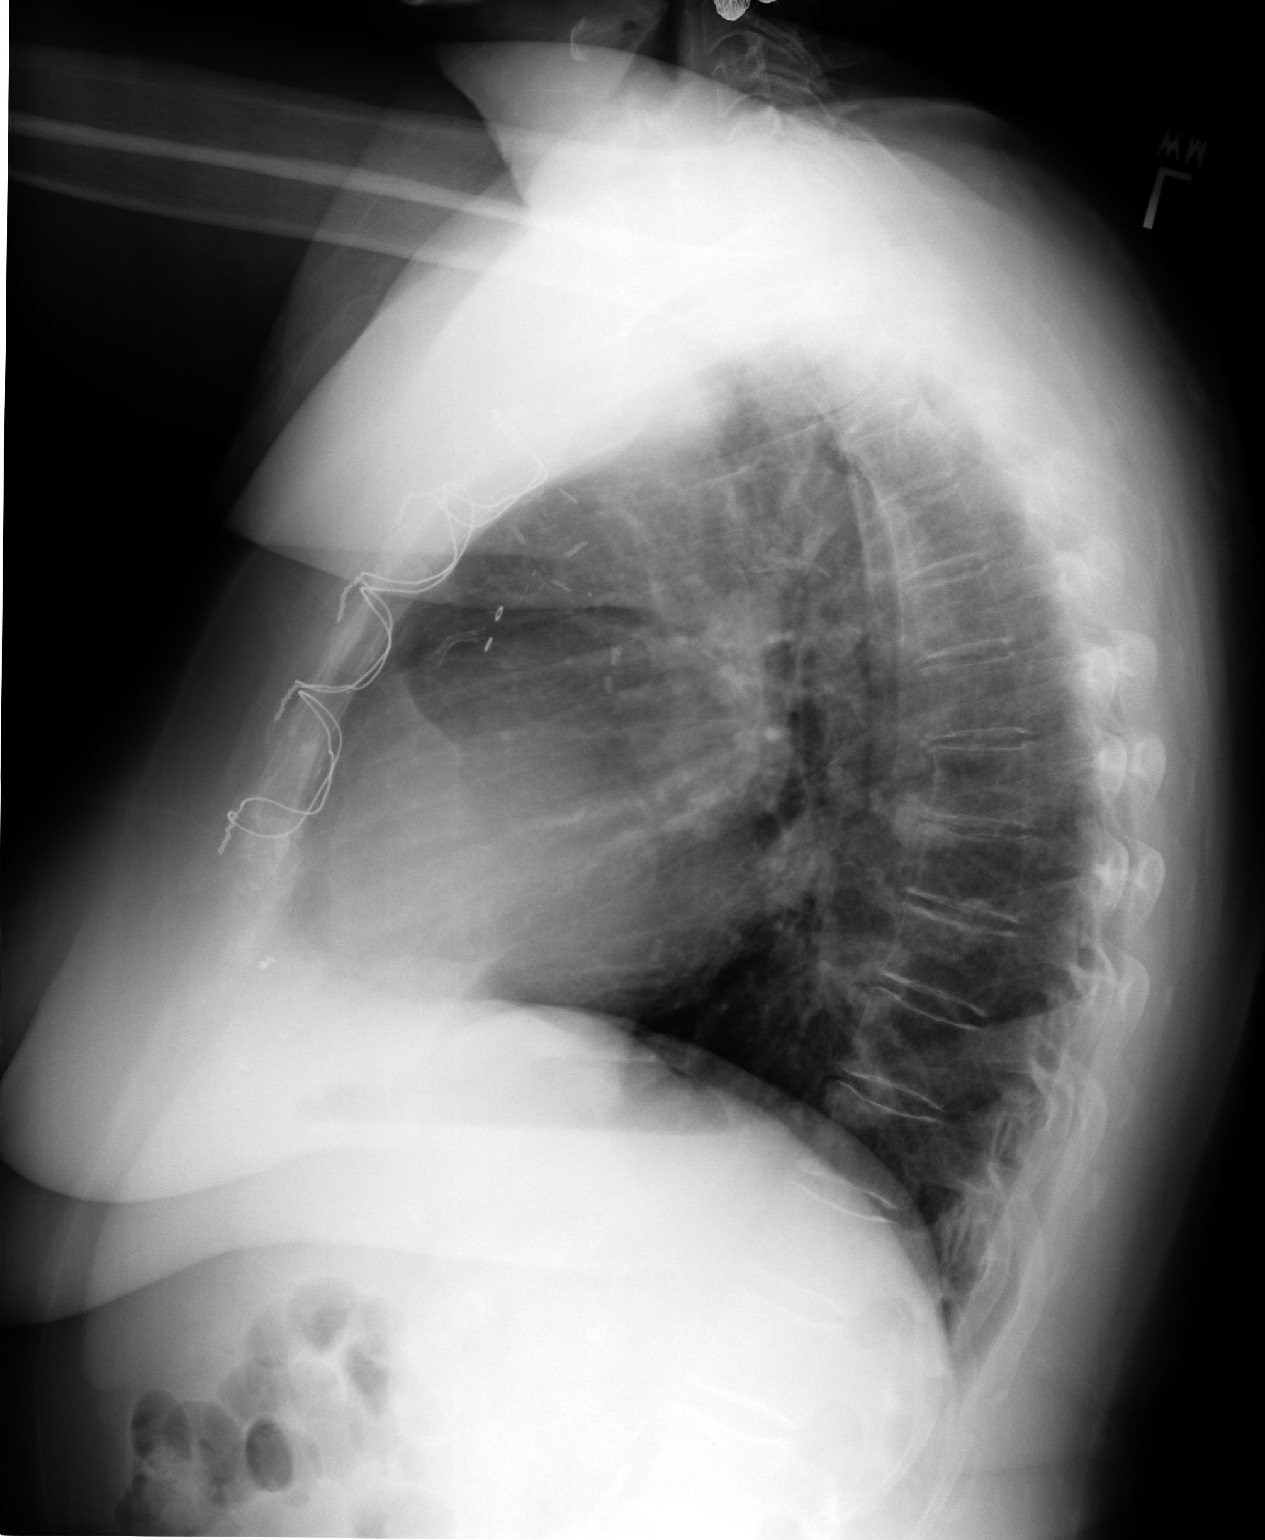

[2 of 2 positions shown; findings below may reference images not displayed]

PROCEDURE:     DXR - DXR CHEST PA (OR AP) AND LATERAL  - January 27, 2010 [DATE]

RESULT:     Comparison is made to the study dated 12/22/2009.

Sternotomy wires are present. The heart is mildly enlarged. The lung
markings are slightly prominent but there is no edema, infiltrate, effusion
or pneumothorax.
IMPRESSION: Stable chest. No acute cardiopulmonary disease.

## 2012-04-12 IMAGING — CR DG CHEST 1V PORT
1 series · 1 of 1 positions shown · non-contrast
Comparison: none

REASON FOR EXAM: chest pain
COMMENTS:

PROCEDURE:     DXR - DXR PORTABLE CHEST SINGLE VIEW  - January 29, 2010  [DATE]
RESULT:     Comparison: 01/27/2010

[view not recorded]
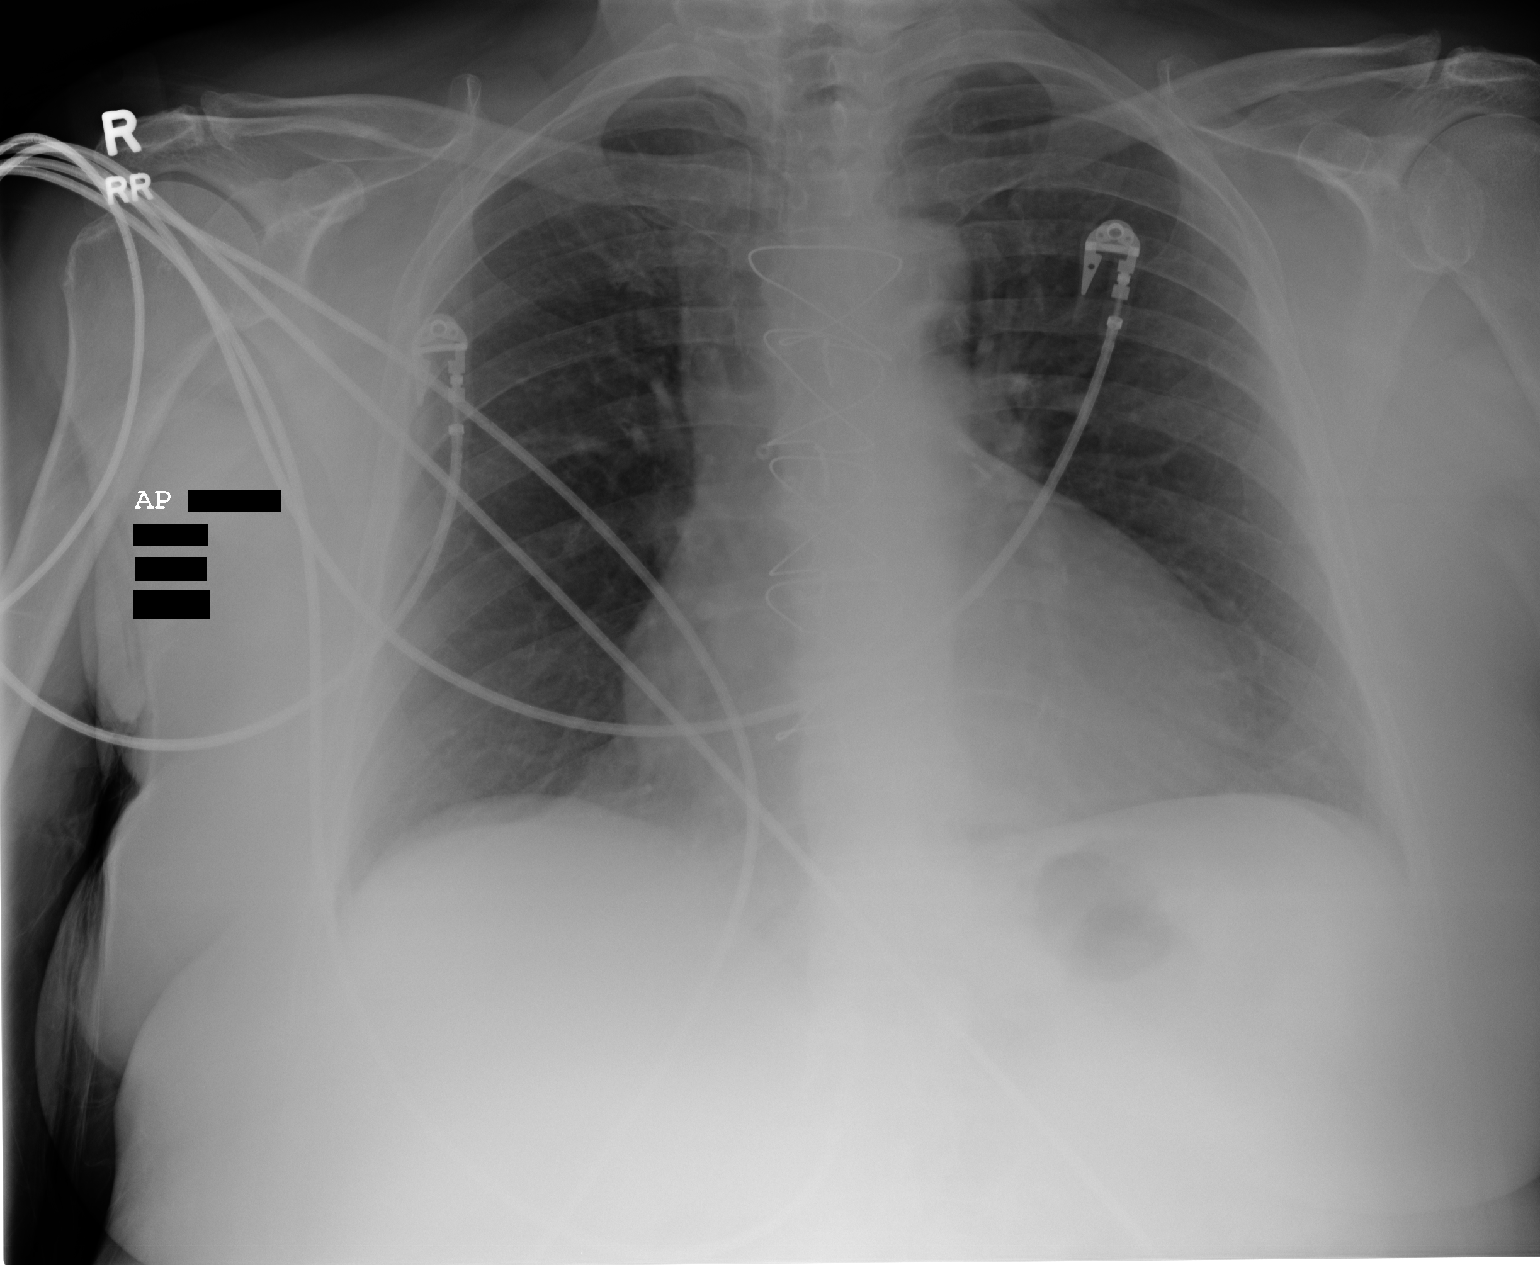

[1 of 1 positions shown; findings below may reference images not displayed]

FINDINGS: The heart is enlarged. Prior median sternotomy and CABG. The lungs are clear.
IMPRESSION: No acute cardiopulmonary disease.

## 2012-04-19 ENCOUNTER — Emergency Department: Payer: Self-pay | Admitting: Emergency Medicine

## 2012-04-19 LAB — URINALYSIS, COMPLETE
Bacteria: NONE SEEN
Bilirubin,UR: NEGATIVE
Blood: NEGATIVE
Nitrite: NEGATIVE
Ph: 5 (ref 4.5–8.0)
Protein: NEGATIVE
RBC,UR: 1 /HPF (ref 0–5)
Specific Gravity: 1.024 (ref 1.003–1.030)

## 2012-05-11 ENCOUNTER — Emergency Department: Payer: Self-pay | Admitting: Emergency Medicine

## 2012-05-11 LAB — COMPREHENSIVE METABOLIC PANEL
Anion Gap: 3 — ABNORMAL LOW (ref 7–16)
BUN: 20 mg/dL — ABNORMAL HIGH (ref 7–18)
Bilirubin,Total: 0.3 mg/dL (ref 0.2–1.0)
Calcium, Total: 8.7 mg/dL (ref 8.5–10.1)
Chloride: 109 mmol/L — ABNORMAL HIGH (ref 98–107)
Creatinine: 0.66 mg/dL (ref 0.60–1.30)
EGFR (Non-African Amer.): >60
Osmolality: 284 (ref 275–301)
Potassium: 3.9 mmol/L (ref 3.5–5.1)
Total Protein: 7.2 g/dL (ref 6.4–8.2)

## 2012-05-11 LAB — URINALYSIS, COMPLETE
Bacteria: NONE SEEN
Bilirubin,UR: NEGATIVE
Glucose,UR: NEGATIVE mg/dL (ref 0–75)
Nitrite: NEGATIVE
Ph: 5 (ref 4.5–8.0)
RBC,UR: 2 /HPF (ref 0–5)
Specific Gravity: 1.012 (ref 1.003–1.030)
Squamous Epithelial: 6

## 2012-05-11 LAB — TROPONIN I: Troponin-I: <0.02 ng/mL

## 2012-05-11 LAB — CBC
MCH: 27.9 pg (ref 26.0–34.0)
MCHC: 32.7 g/dL (ref 32.0–36.0)
MCV: 85 fL (ref 80–100)
WBC: 8.1 10*3/uL (ref 3.6–11.0)

## 2012-05-11 IMAGING — CR DG CHEST 2V
1 series · 2 of 2 positions shown · non-contrast
Comparison: none

REASON FOR EXAM: chest pain
COMMENTS:   May transport without cardiac monitor

PROCEDURE:     DXR - DXR CHEST PA (OR AP) AND LATERAL  - February 27, 2010  [DATE]
RESULT:     Comparison: 02/08/2010

[Series 1: view not recorded · 0.17mm/px · 2 of 2 slices shown]
[im 1/2]
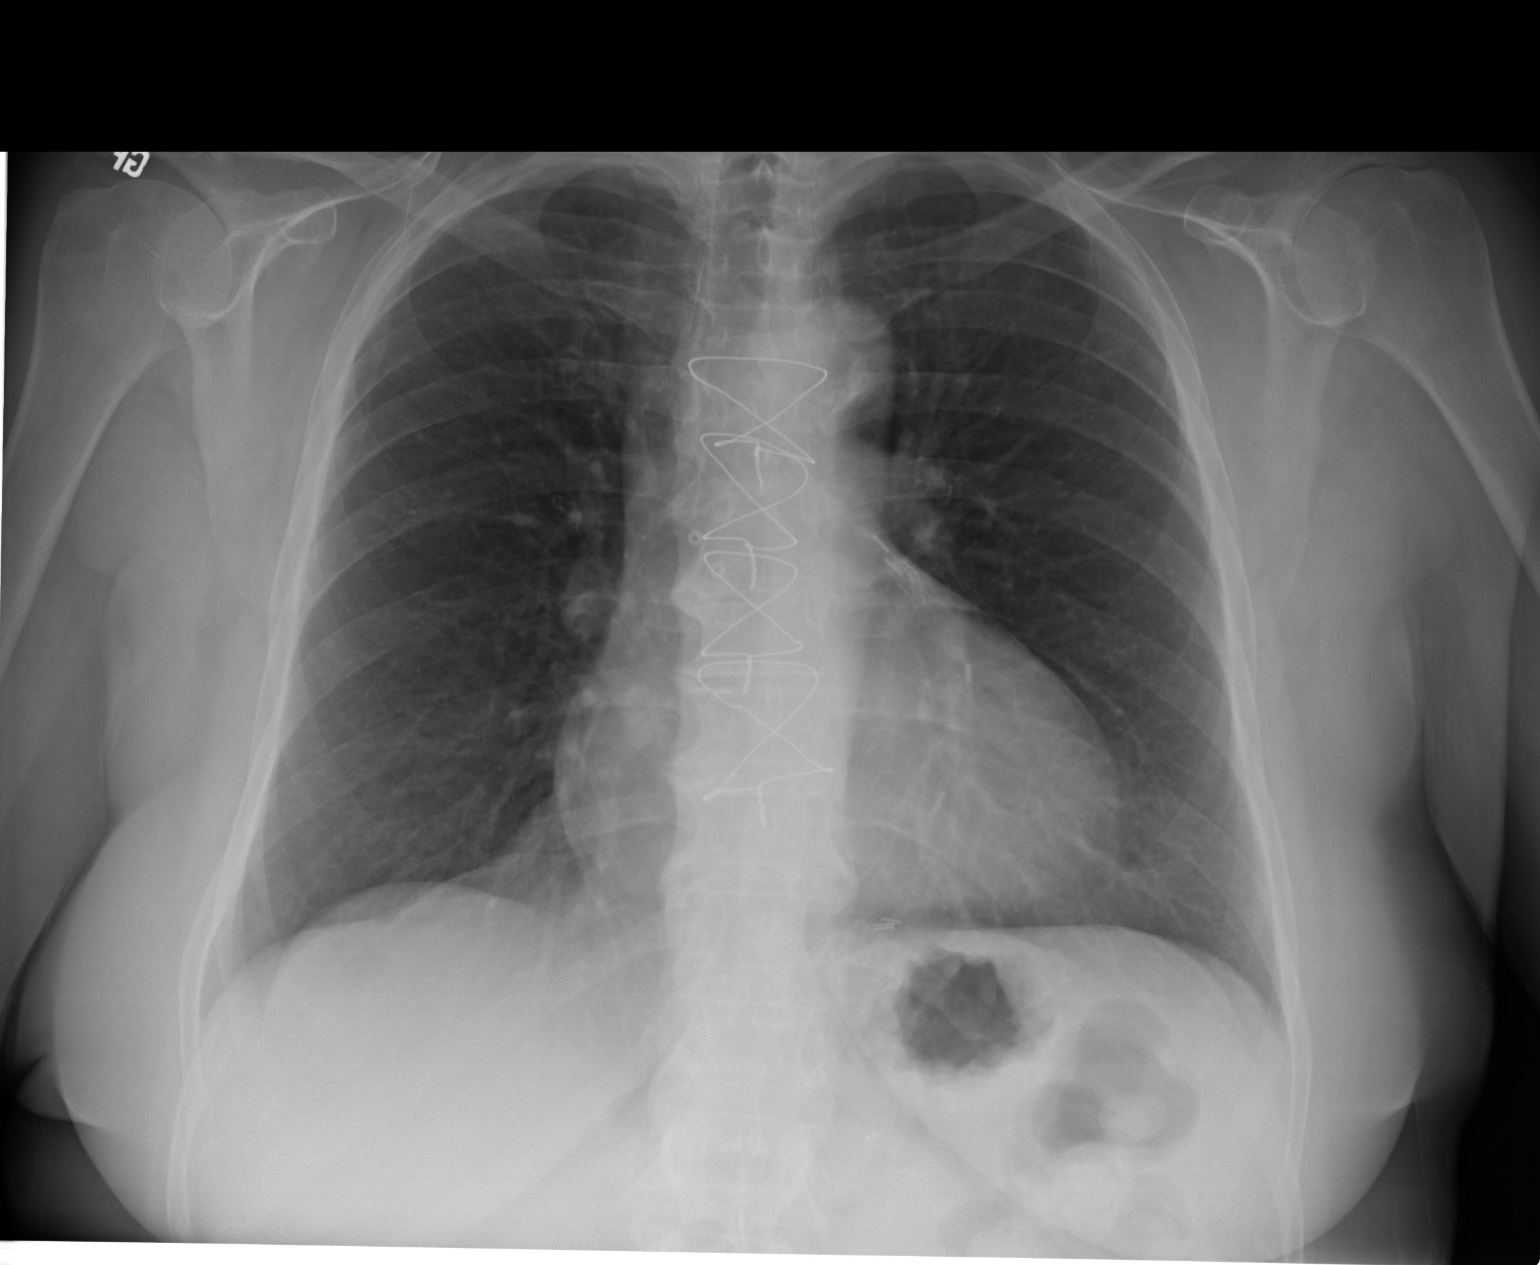
[im 2/2]
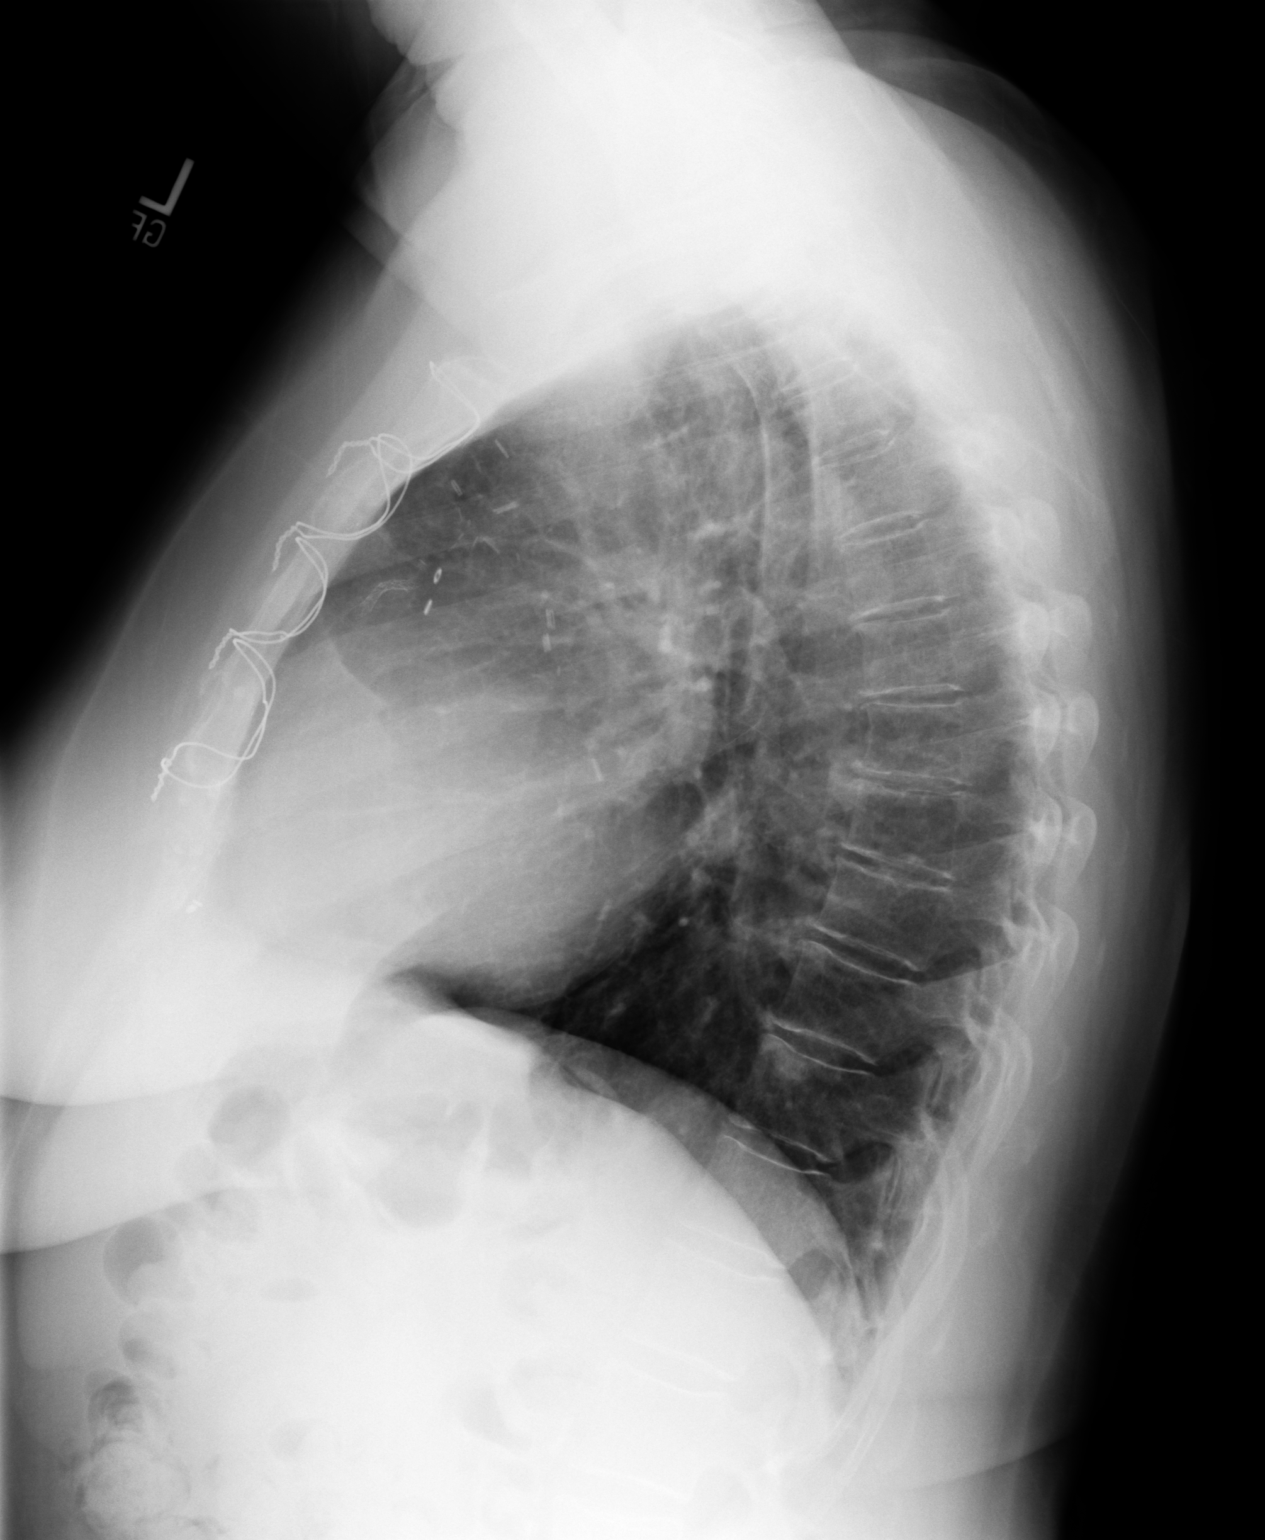

[2 of 2 positions shown; findings below may reference images not displayed]

FINDINGS: PA and lateral chest radiographs are provided.  There is no focal
parenchymal opacity, pleural effusion, or pneumothorax. The heart and
mediastinum are unremarkable. There is evidence of prior CABG. The osseous
structures are unremarkable.
IMPRESSION: No acute disease of the chest.

## 2012-05-24 ENCOUNTER — Emergency Department: Payer: Self-pay | Admitting: Unknown Physician Specialty

## 2012-05-24 LAB — CBC
HCT: 38.9 % (ref 35.0–47.0)
HGB: 12.2 g/dL (ref 12.0–16.0)
MCH: 27 pg (ref 26.0–34.0)
MCHC: 31.2 g/dL — ABNORMAL LOW (ref 32.0–36.0)
MCV: 86 fL (ref 80–100)
Platelet: 257 10*3/uL (ref 150–440)
RBC: 4.51 10*6/uL (ref 3.80–5.20)
RDW: 14.1 % (ref 11.5–14.5)
WBC: 9.4 10*3/uL (ref 3.6–11.0)

## 2012-05-24 LAB — COMPREHENSIVE METABOLIC PANEL
Albumin: 3.8 g/dL (ref 3.4–5.0)
Alkaline Phosphatase: 125 U/L (ref 50–136)
BUN: 22 mg/dL — ABNORMAL HIGH (ref 7–18)
Bilirubin,Total: 0.2 mg/dL (ref 0.2–1.0)
Calcium, Total: 8.4 mg/dL — ABNORMAL LOW (ref 8.5–10.1)
Creatinine: 0.92 mg/dL (ref 0.60–1.30)
EGFR (African American): >60
Osmolality: 288 (ref 275–301)
Potassium: 3.9 mmol/L (ref 3.5–5.1)
SGOT(AST): 25 U/L (ref 15–37)
Sodium: 140 mmol/L (ref 136–145)
Total Protein: 7.8 g/dL (ref 6.4–8.2)

## 2012-05-24 LAB — CK TOTAL AND CKMB (NOT AT ARMC)
CK, Total: 99 U/L (ref 21–215)
CK-MB: 1.3 ng/mL (ref 0.5–3.6)

## 2012-05-24 LAB — URINALYSIS, COMPLETE
Bilirubin,UR: NEGATIVE
Glucose,UR: NEGATIVE mg/dL (ref 0–75)
Ketone: NEGATIVE
Ph: 5 (ref 4.5–8.0)
Protein: NEGATIVE
Squamous Epithelial: 5

## 2012-06-10 ENCOUNTER — Emergency Department: Payer: Self-pay | Admitting: Emergency Medicine

## 2012-06-10 LAB — URINALYSIS, COMPLETE
Blood: NEGATIVE
Ketone: NEGATIVE
Nitrite: NEGATIVE
Ph: 5 (ref 4.5–8.0)
RBC,UR: <1 /HPF (ref 0–5)
Squamous Epithelial: 2
WBC UR: 1 /HPF (ref 0–5)

## 2012-06-28 ENCOUNTER — Emergency Department: Payer: Self-pay | Admitting: Emergency Medicine

## 2012-06-28 LAB — BASIC METABOLIC PANEL
BUN: 23 mg/dL — ABNORMAL HIGH (ref 7–18)
Calcium, Total: 8.5 mg/dL (ref 8.5–10.1)
Creatinine: 0.62 mg/dL (ref 0.60–1.30)
Glucose: 146 mg/dL — ABNORMAL HIGH (ref 65–99)
Osmolality: 289 (ref 275–301)
Potassium: 4.1 mmol/L (ref 3.5–5.1)
Sodium: 142 mmol/L (ref 136–145)

## 2012-06-28 LAB — CBC
HGB: 11.3 g/dL — ABNORMAL LOW (ref 12.0–16.0)
MCV: 85 fL (ref 80–100)
Platelet: 256 10*3/uL (ref 150–440)
RBC: 4.06 10*6/uL (ref 3.80–5.20)
WBC: 10.9 10*3/uL (ref 3.6–11.0)

## 2012-06-28 LAB — TROPONIN I
Troponin-I: <0.02 ng/mL
Troponin-I: <0.02 ng/mL

## 2012-07-05 ENCOUNTER — Inpatient Hospital Stay: Payer: Self-pay | Admitting: Internal Medicine

## 2012-07-05 LAB — URINALYSIS, COMPLETE
Bilirubin,UR: NEGATIVE
Ketone: NEGATIVE
Nitrite: NEGATIVE
Ph: 7 (ref 4.5–8.0)
Protein: 30
RBC,UR: 1 /HPF (ref 0–5)
WBC UR: 1 /HPF (ref 0–5)

## 2012-07-05 LAB — CBC
HCT: 36.7 % (ref 35.0–47.0)
HGB: 11.9 g/dL — ABNORMAL LOW (ref 12.0–16.0)
MCH: 27.5 pg (ref 26.0–34.0)
Platelet: 278 10*3/uL (ref 150–440)
RBC: 4.34 10*6/uL (ref 3.80–5.20)

## 2012-07-05 LAB — TROPONIN I: Troponin-I: <0.02 ng/mL

## 2012-07-05 LAB — COMPREHENSIVE METABOLIC PANEL
Albumin: 3.7 g/dL (ref 3.4–5.0)
Alkaline Phosphatase: 128 U/L (ref 50–136)
Anion Gap: 8 (ref 7–16)
Co2: 26 mmol/L (ref 21–32)
EGFR (Non-African Amer.): >60
Potassium: 3.6 mmol/L (ref 3.5–5.1)
SGOT(AST): 15 U/L (ref 15–37)
Total Protein: 7.4 g/dL (ref 6.4–8.2)

## 2012-07-05 LAB — MAGNESIUM: Magnesium: 1.7 mg/dL — ABNORMAL LOW

## 2012-07-05 LAB — TSH: Thyroid Stimulating Horm: 4.09 u[IU]/mL

## 2012-07-05 LAB — CK TOTAL AND CKMB (NOT AT ARMC): CK, Total: 90 U/L (ref 21–215)

## 2012-07-06 LAB — BASIC METABOLIC PANEL
Calcium, Total: 8.5 mg/dL (ref 8.5–10.1)
Chloride: 110 mmol/L — ABNORMAL HIGH (ref 98–107)
EGFR (African American): >60
EGFR (Non-African Amer.): >60
Glucose: 121 mg/dL — ABNORMAL HIGH (ref 65–99)
Osmolality: 290 (ref 275–301)
Potassium: 4.2 mmol/L (ref 3.5–5.1)
Sodium: 143 mmol/L (ref 136–145)

## 2012-07-06 LAB — LIPID PANEL
Cholesterol: 211 mg/dL — ABNORMAL HIGH (ref 0–200)
HDL Cholesterol: 49 mg/dL (ref 40–60)
Ldl Cholesterol, Calc: 128 mg/dL — ABNORMAL HIGH (ref 0–100)
Triglycerides: 170 mg/dL (ref 0–200)

## 2012-07-06 LAB — MAGNESIUM: Magnesium: 2.2 mg/dL

## 2012-07-09 IMAGING — CR DG ABDOMEN 3V
1 series · 4 of 4 positions shown · non-contrast
Comparison: none

REASON FOR EXAM: chest and abd pain
COMMENTS:

PROCEDURE:     DXR - DXR ABDOMEN 3-WAY (INCL PA CXR)  - April 27, 2010  [DATE]
RESULT:     Comparisons:  None

[Series 1: view not recorded · 0.17mm/px · 4 of 4 slices shown]
[im 1/4]
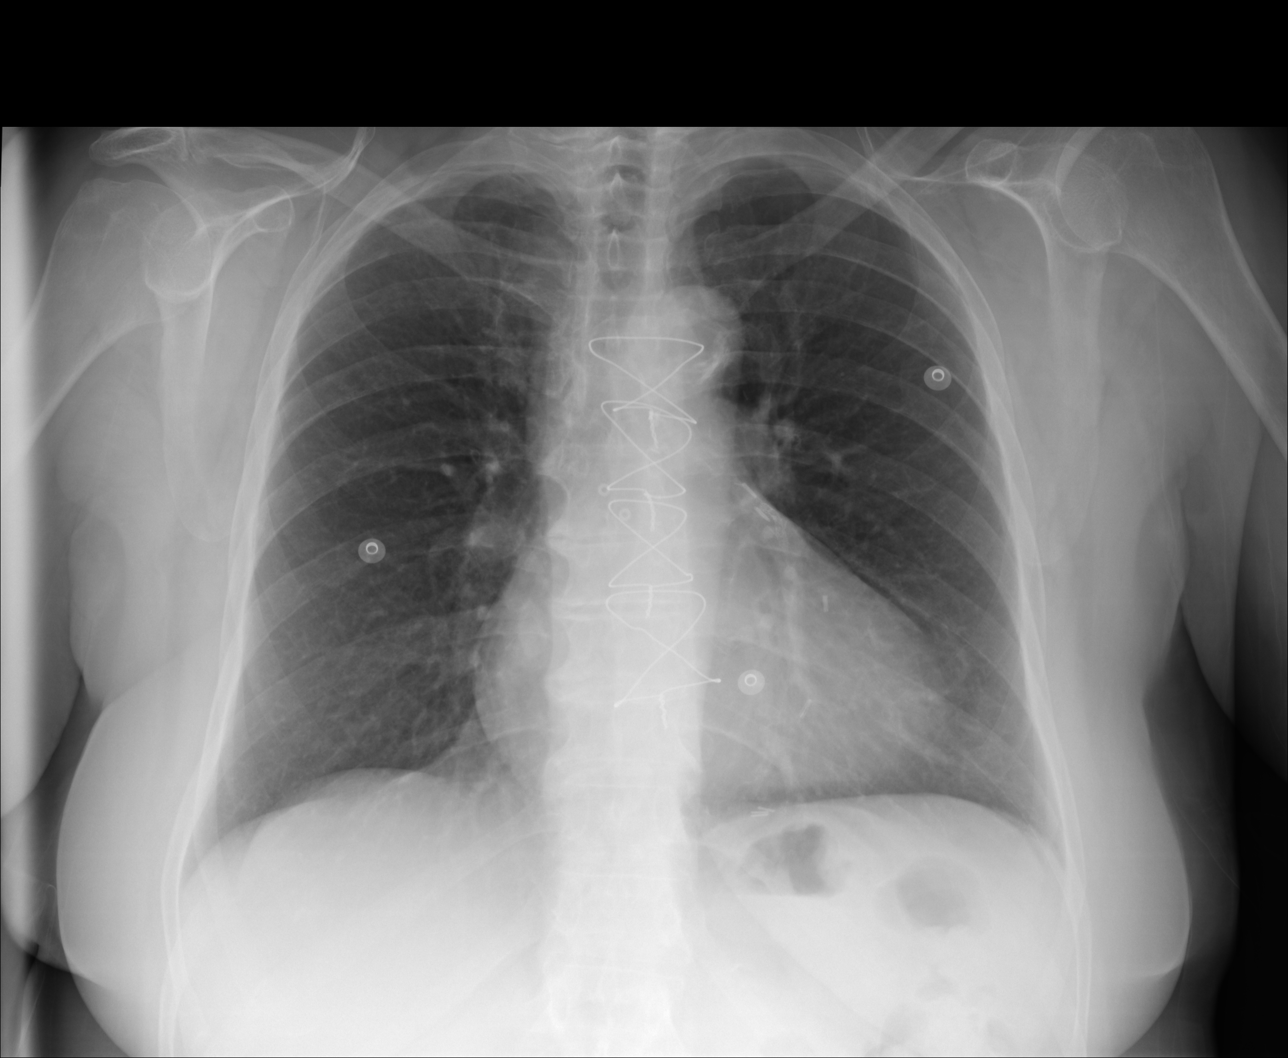
[im 2/4]
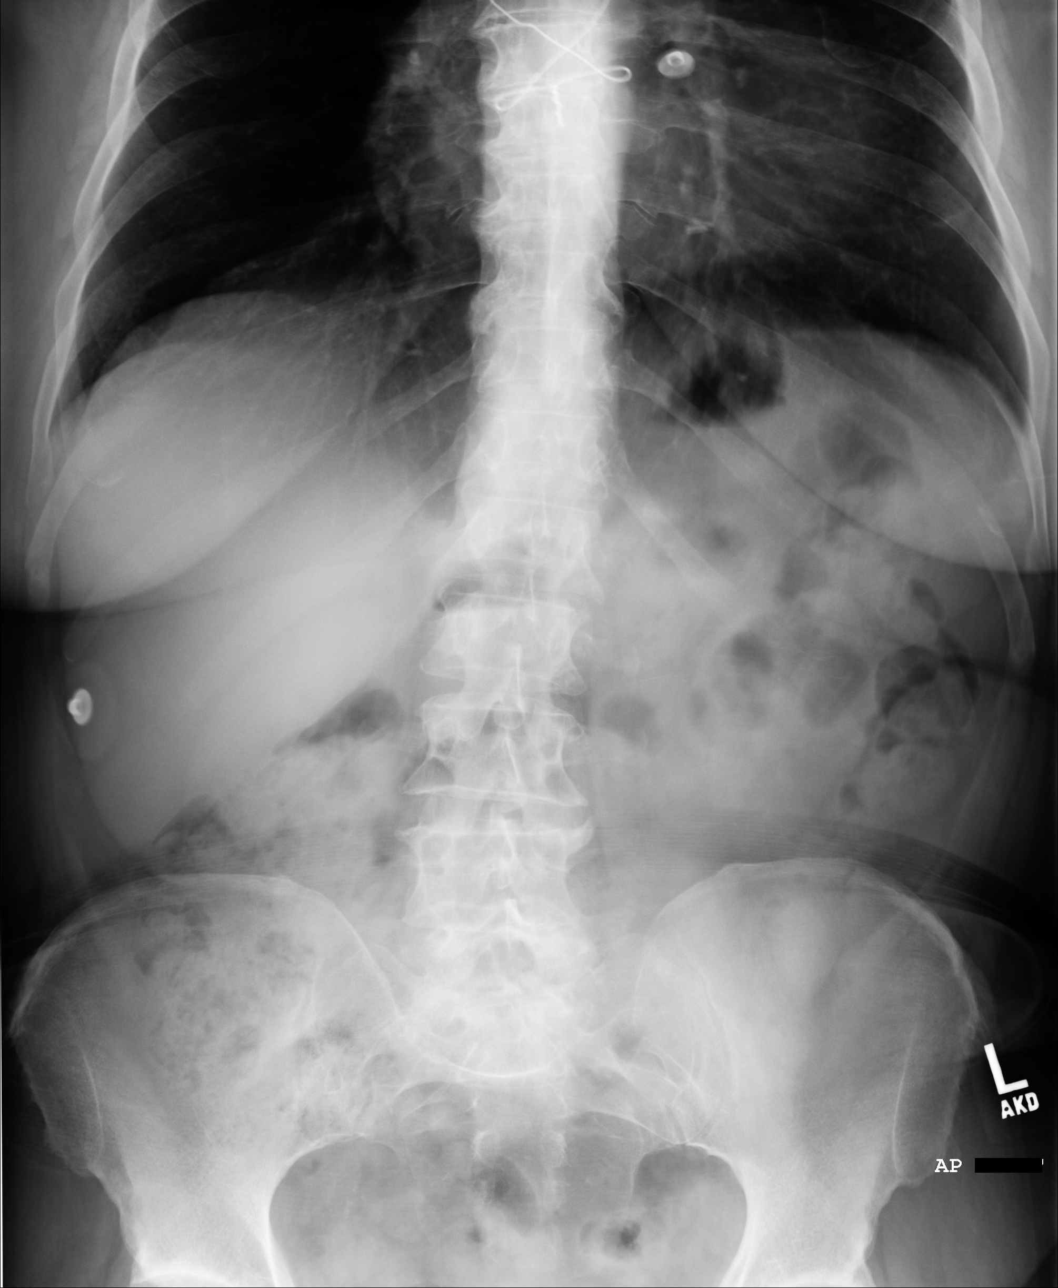
[im 3/4]
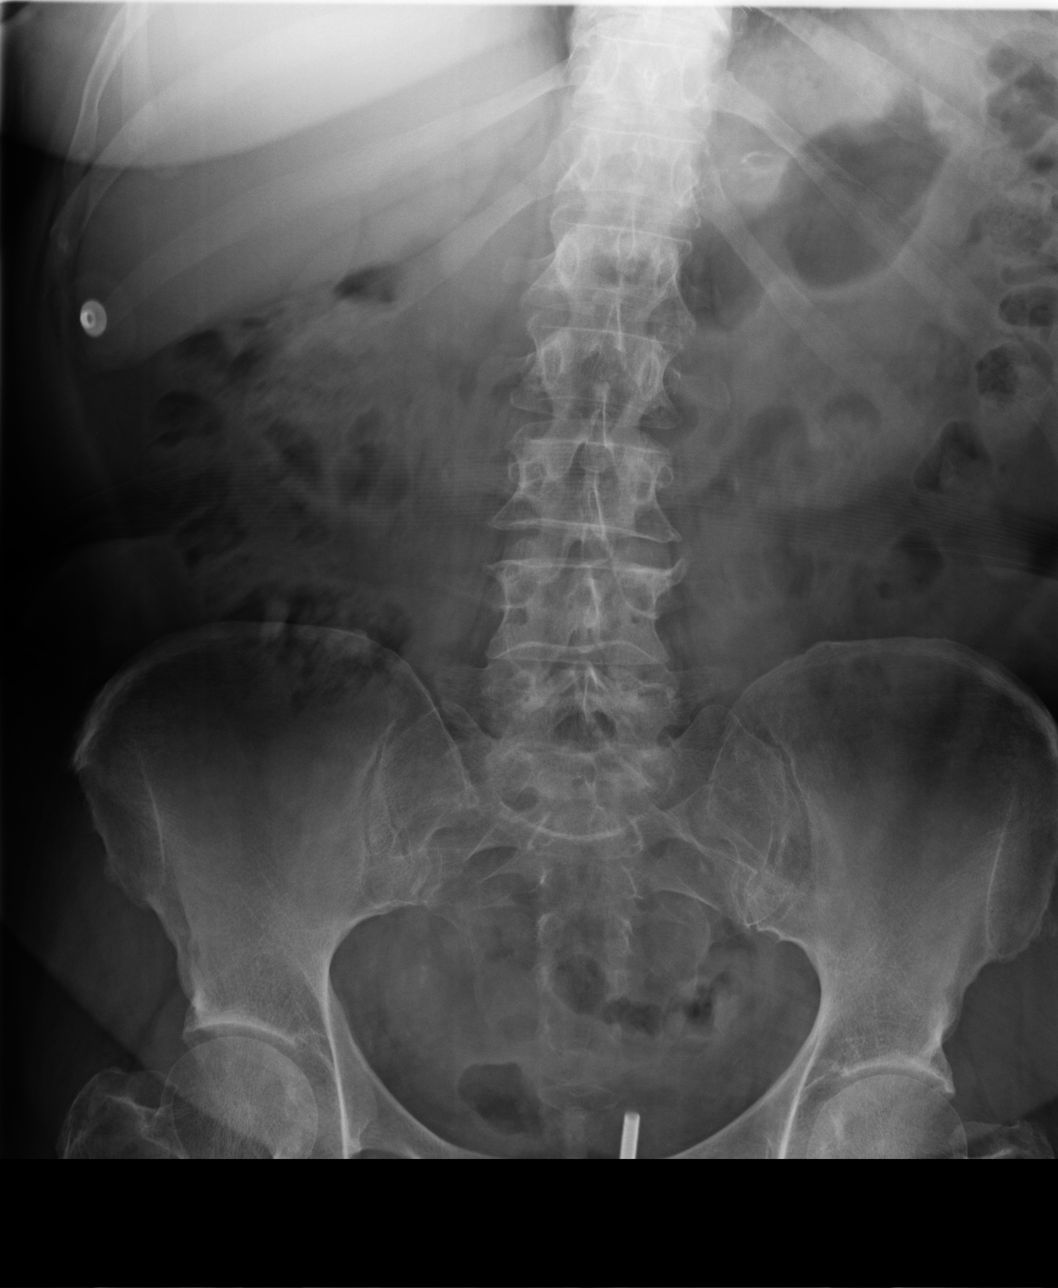
[im 4/4]
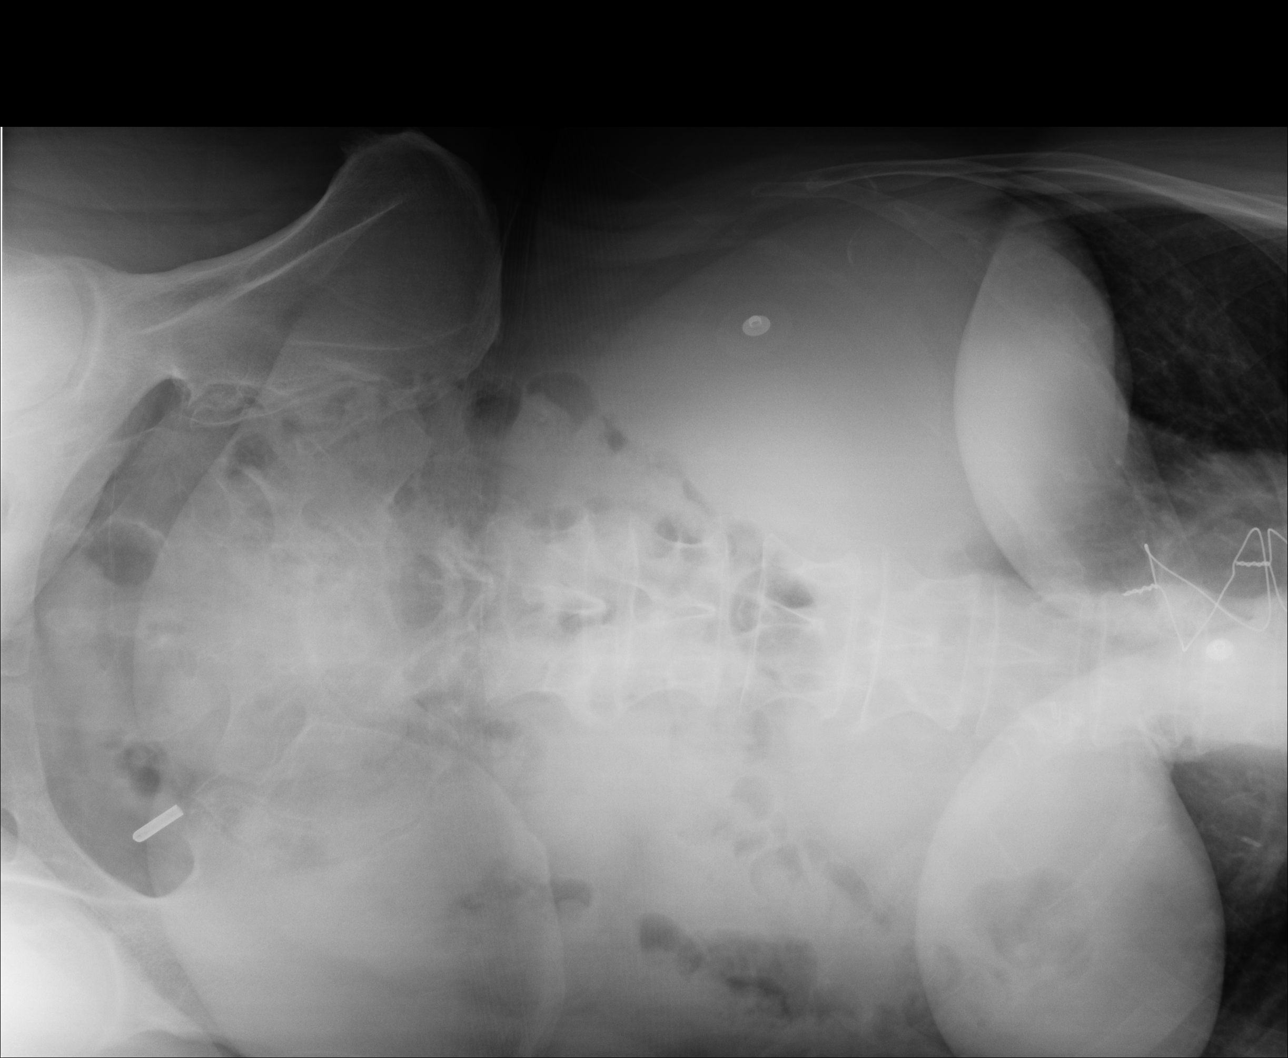

[4 of 4 positions shown; findings below may reference images not displayed]

FINDINGS: PA chest and supine, upright, and left lateral decubitus views of the
abdomen are provided.

The chest is clear. The heart and mediastinum are unremarkable. There is
evidence of prior CABG. The osseous structures are unremarkable.

There is a nonspecific bowel gas pattern. There is no bowel dilatation to
suggest obstruction. There are no air-fluid levels. There are no dilated
loops, bowel wall thickening, or evidence of mass effect.  Soft tissue
shadows are normal. There is no evidence of pneumoperitoneum, portal venous
gas, or pneumatosis. There is a tubular metallic object projecting over the
left lower pelvis which may represent something inside versus laying over
the patient.

Osseous structures are unremarkable.
IMPRESSION: There is a tubular metallic object projecting over the left lower pelvis
which may represent something inside versus laying over the patient.
Correlate with clinical exam.

## 2012-07-15 ENCOUNTER — Emergency Department: Payer: Self-pay | Admitting: Emergency Medicine

## 2012-07-15 LAB — URINALYSIS, COMPLETE
Bacteria: NONE SEEN
Blood: NEGATIVE
Glucose,UR: NEGATIVE mg/dL (ref 0–75)
Ketone: NEGATIVE
Nitrite: NEGATIVE
Ph: 7 (ref 4.5–8.0)
RBC,UR: <1 /HPF (ref 0–5)
Squamous Epithelial: 2
WBC UR: 1 /HPF (ref 0–5)

## 2012-07-15 LAB — COMPREHENSIVE METABOLIC PANEL
Albumin: 3.8 g/dL (ref 3.4–5.0)
Alkaline Phosphatase: 106 U/L (ref 50–136)
Anion Gap: 4 — ABNORMAL LOW (ref 7–16)
Bilirubin,Total: 0.3 mg/dL (ref 0.2–1.0)
Calcium, Total: 8.6 mg/dL (ref 8.5–10.1)
Co2: 29 mmol/L (ref 21–32)
Glucose: 114 mg/dL — ABNORMAL HIGH (ref 65–99)
Potassium: 3.9 mmol/L (ref 3.5–5.1)
SGOT(AST): 16 U/L (ref 15–37)
SGPT (ALT): 22 U/L (ref 12–78)
Sodium: 143 mmol/L (ref 136–145)
Total Protein: 7.3 g/dL (ref 6.4–8.2)

## 2012-07-15 LAB — CBC
HGB: 11.7 g/dL — ABNORMAL LOW (ref 12.0–16.0)
MCH: 27.3 pg (ref 26.0–34.0)
MCHC: 32.8 g/dL (ref 32.0–36.0)
MCV: 83 fL (ref 80–100)
Platelet: 257 10*3/uL (ref 150–440)
RBC: 4.27 10*6/uL (ref 3.80–5.20)

## 2012-07-15 LAB — TROPONIN I: Troponin-I: <0.02 ng/mL

## 2012-07-22 LAB — BASIC METABOLIC PANEL
BUN: 19 mg/dL — ABNORMAL HIGH (ref 7–18)
Calcium, Total: 9.2 mg/dL (ref 8.5–10.1)
Creatinine: 0.71 mg/dL (ref 0.60–1.30)
EGFR (African American): >60
EGFR (Non-African Amer.): >60
Glucose: 107 mg/dL — ABNORMAL HIGH (ref 65–99)
Osmolality: 288 (ref 275–301)
Potassium: 4 mmol/L (ref 3.5–5.1)
Sodium: 143 mmol/L (ref 136–145)

## 2012-07-22 LAB — URINALYSIS, COMPLETE
Bilirubin,UR: NEGATIVE
Blood: NEGATIVE
Glucose,UR: NEGATIVE mg/dL (ref 0–75)
Nitrite: NEGATIVE
Ph: 5 (ref 4.5–8.0)
Protein: NEGATIVE
RBC,UR: <1 /HPF (ref 0–5)
Specific Gravity: 1.017 (ref 1.003–1.030)

## 2012-07-22 LAB — CBC
HCT: 34.5 % — ABNORMAL LOW (ref 35.0–47.0)
Platelet: 236 10*3/uL (ref 150–440)
RBC: 4.16 10*6/uL (ref 3.80–5.20)
RDW: 14.6 % — ABNORMAL HIGH (ref 11.5–14.5)

## 2012-07-22 LAB — CK TOTAL AND CKMB (NOT AT ARMC): CK, Total: 76 U/L (ref 21–215)

## 2012-07-23 ENCOUNTER — Observation Stay: Payer: Self-pay | Admitting: Internal Medicine

## 2012-07-23 LAB — CK TOTAL AND CKMB (NOT AT ARMC)
CK, Total: 61 U/L (ref 21–215)
CK-MB: 0.6 ng/mL (ref 0.5–3.6)

## 2012-09-25 ENCOUNTER — Observation Stay: Payer: Self-pay | Admitting: Internal Medicine

## 2012-09-25 LAB — CBC
HCT: 36.8 % (ref 35.0–47.0)
HGB: 12 g/dL (ref 12.0–16.0)
MCHC: 32.7 g/dL (ref 32.0–36.0)
RDW: 15.4 % — ABNORMAL HIGH (ref 11.5–14.5)
WBC: 9.5 10*3/uL (ref 3.6–11.0)

## 2012-09-25 LAB — URINALYSIS, COMPLETE
Bacteria: NONE SEEN
Bilirubin,UR: NEGATIVE
Blood: NEGATIVE
Glucose,UR: NEGATIVE mg/dL (ref 0–75)
Nitrite: NEGATIVE
Ph: 7 (ref 4.5–8.0)
Specific Gravity: 1.01 (ref 1.003–1.030)
Squamous Epithelial: <1
WBC UR: 1 /HPF (ref 0–5)

## 2012-09-25 LAB — COMPREHENSIVE METABOLIC PANEL
Albumin: 3.9 g/dL (ref 3.4–5.0)
Anion Gap: 5 — ABNORMAL LOW (ref 7–16)
BUN: 15 mg/dL (ref 7–18)
Bilirubin,Total: 0.3 mg/dL (ref 0.2–1.0)
Calcium, Total: 9 mg/dL (ref 8.5–10.1)
Co2: 29 mmol/L (ref 21–32)
EGFR (Non-African Amer.): >60
Glucose: 109 mg/dL — ABNORMAL HIGH (ref 65–99)
Osmolality: 286 (ref 275–301)
Potassium: 3.8 mmol/L (ref 3.5–5.1)
SGOT(AST): 18 U/L (ref 15–37)
SGPT (ALT): 25 U/L (ref 12–78)
Sodium: 143 mmol/L (ref 136–145)
Total Protein: 7.4 g/dL (ref 6.4–8.2)

## 2012-09-25 LAB — TROPONIN I: Troponin-I: <0.02 ng/mL

## 2012-09-26 LAB — CBC WITH DIFFERENTIAL/PLATELET
Basophil #: 0.1 10*3/uL (ref 0.0–0.1)
Basophil %: 1.3 %
Eosinophil #: 0.1 10*3/uL (ref 0.0–0.7)
Eosinophil %: 0.7 %
HCT: 35.5 % (ref 35.0–47.0)
HGB: 12 g/dL (ref 12.0–16.0)
Lymphocyte #: 2.4 10*3/uL (ref 1.0–3.6)
Lymphocyte %: 25.7 %
MCH: 28 pg (ref 26.0–34.0)
MCV: 83 fL (ref 80–100)
RBC: 4.27 10*6/uL (ref 3.80–5.20)
RDW: 15.5 % — ABNORMAL HIGH (ref 11.5–14.5)
WBC: 9.2 10*3/uL (ref 3.6–11.0)

## 2012-09-26 LAB — LIPID PANEL: Cholesterol: 254 mg/dL — ABNORMAL HIGH (ref 0–200)

## 2012-09-26 LAB — BASIC METABOLIC PANEL
BUN: 15 mg/dL (ref 7–18)
Calcium, Total: 8.8 mg/dL (ref 8.5–10.1)
Chloride: 110 mmol/L — ABNORMAL HIGH (ref 98–107)
Co2: 28 mmol/L (ref 21–32)
EGFR (African American): >60
Glucose: 106 mg/dL — ABNORMAL HIGH (ref 65–99)
Osmolality: 288 (ref 275–301)
Sodium: 144 mmol/L (ref 136–145)

## 2012-09-26 LAB — TROPONIN I: Troponin-I: <0.02 ng/mL

## 2012-09-27 LAB — URINE CULTURE

## 2012-10-10 ENCOUNTER — Emergency Department: Payer: Self-pay | Admitting: Emergency Medicine

## 2012-10-10 LAB — CBC
HCT: 35.6 % (ref 35.0–47.0)
MCH: 28.2 pg (ref 26.0–34.0)
MCHC: 33.7 g/dL (ref 32.0–36.0)
Platelet: 216 10*3/uL (ref 150–440)
RBC: 4.25 10*6/uL (ref 3.80–5.20)

## 2012-10-10 LAB — BASIC METABOLIC PANEL
Anion Gap: 6 — ABNORMAL LOW (ref 7–16)
BUN: 21 mg/dL — ABNORMAL HIGH (ref 7–18)
Calcium, Total: 8.7 mg/dL (ref 8.5–10.1)
Chloride: 110 mmol/L — ABNORMAL HIGH (ref 98–107)
EGFR (African American): >60
EGFR (Non-African Amer.): >60
Glucose: 115 mg/dL — ABNORMAL HIGH (ref 65–99)
Sodium: 143 mmol/L (ref 136–145)

## 2012-10-24 ENCOUNTER — Emergency Department: Payer: Self-pay | Admitting: Emergency Medicine

## 2012-10-24 LAB — CBC
HCT: 39 % (ref 35.0–47.0)
HGB: 12.9 g/dL (ref 12.0–16.0)
MCHC: 33.2 g/dL (ref 32.0–36.0)
MCV: 83 fL (ref 80–100)
Platelet: 252 10*3/uL (ref 150–440)

## 2012-10-24 LAB — COMPREHENSIVE METABOLIC PANEL
Albumin: 3.9 g/dL (ref 3.4–5.0)
Alkaline Phosphatase: 125 U/L (ref 50–136)
Anion Gap: 6 — ABNORMAL LOW (ref 7–16)
BUN: 22 mg/dL — ABNORMAL HIGH (ref 7–18)
Bilirubin,Total: 0.2 mg/dL (ref 0.2–1.0)
Calcium, Total: 9 mg/dL (ref 8.5–10.1)
Chloride: 108 mmol/L — ABNORMAL HIGH (ref 98–107)
EGFR (African American): >60
Glucose: 123 mg/dL — ABNORMAL HIGH (ref 65–99)
Osmolality: 284 (ref 275–301)
Potassium: 4.3 mmol/L (ref 3.5–5.1)
SGPT (ALT): 26 U/L (ref 12–78)
Total Protein: 7.5 g/dL (ref 6.4–8.2)

## 2012-10-24 LAB — URINALYSIS, COMPLETE
Bacteria: NONE SEEN
Bilirubin,UR: NEGATIVE
Blood: NEGATIVE
Ketone: NEGATIVE
Leukocyte Esterase: NEGATIVE
Nitrite: NEGATIVE
Ph: 5 (ref 4.5–8.0)
RBC,UR: <1 /HPF (ref 0–5)
Squamous Epithelial: NONE SEEN
WBC UR: <1 /HPF (ref 0–5)

## 2012-10-24 LAB — CK TOTAL AND CKMB (NOT AT ARMC): CK, Total: 87 U/L (ref 21–215)

## 2012-10-24 LAB — TROPONIN I: Troponin-I: <0.02 ng/mL

## 2012-11-09 ENCOUNTER — Emergency Department: Payer: Self-pay | Admitting: Emergency Medicine

## 2012-11-09 LAB — URINALYSIS, COMPLETE
Blood: NEGATIVE
Glucose,UR: NEGATIVE mg/dL (ref 0–75)
Ketone: NEGATIVE
Leukocyte Esterase: NEGATIVE
Nitrite: NEGATIVE
Ph: 5 (ref 4.5–8.0)
Protein: NEGATIVE
Specific Gravity: 1.021 (ref 1.003–1.030)
Squamous Epithelial: 1
WBC UR: 3 /HPF (ref 0–5)

## 2012-11-09 LAB — COMPREHENSIVE METABOLIC PANEL
Albumin: 3.7 g/dL (ref 3.4–5.0)
Alkaline Phosphatase: 123 U/L (ref 50–136)
Anion Gap: 5 — ABNORMAL LOW (ref 7–16)
Bilirubin,Total: 0.3 mg/dL (ref 0.2–1.0)
Calcium, Total: 8.9 mg/dL (ref 8.5–10.1)
Chloride: 109 mmol/L — ABNORMAL HIGH (ref 98–107)
Co2: 27 mmol/L (ref 21–32)
Creatinine: 0.73 mg/dL (ref 0.60–1.30)
EGFR (African American): >60
Osmolality: 285 (ref 275–301)
Potassium: 3.9 mmol/L (ref 3.5–5.1)
SGOT(AST): 20 U/L (ref 15–37)
Sodium: 141 mmol/L (ref 136–145)
Total Protein: 7.2 g/dL (ref 6.4–8.2)

## 2012-11-09 LAB — CBC
HCT: 35.9 % (ref 35.0–47.0)
HGB: 11.8 g/dL — ABNORMAL LOW (ref 12.0–16.0)
MCH: 27.7 pg (ref 26.0–34.0)
MCHC: 32.9 g/dL (ref 32.0–36.0)
MCV: 84 fL (ref 80–100)
Platelet: 231 10*3/uL (ref 150–440)

## 2012-11-09 IMAGING — CT CT ABD-PELV W/ CM
1 of 3 series · 13 of 32 positions shown, 19 images · IV contrast (isovue)
Comparison: none

REASON FOR EXAM: (1) left abd pain; (2) same
COMMENTS:

PROCEDURE:     CT  - CT ABDOMEN / PELVIS  W  - August 28, 2010  [DATE]
RESULT:     Comparison:  07/31/2009
TECHNIQUE: Multiple axial images of the abdomen and pelvis were performed
from the lung bases to the pubic symphysis, with p.o. contrast and with 100
mL of Isovue 370 intravenous contrast. Delayed images were also performed of
the abdomen and pelvis.

[Series 2: 3mm soft tissue · axial · 0.68mm/px · z∈[+997,+1390]mm · 13 of 153 slices shown, 19 images]
[im 11/153  soft-tissue]
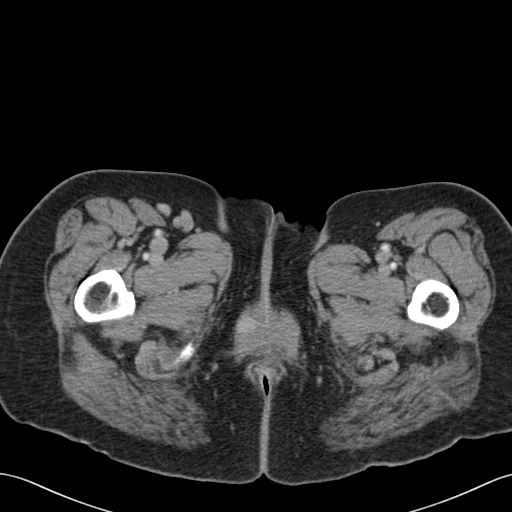
[im 11/153  bone]
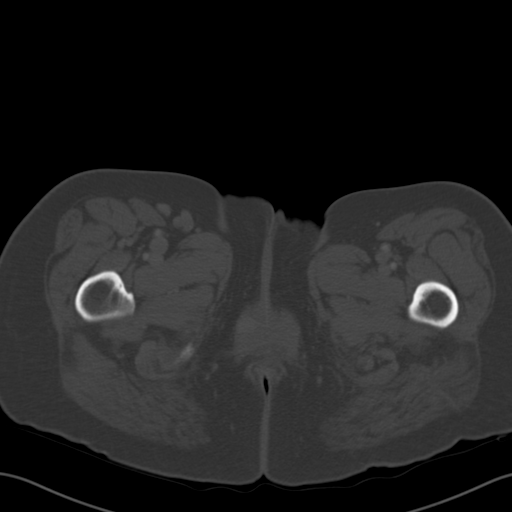
[im 22/153  soft-tissue]
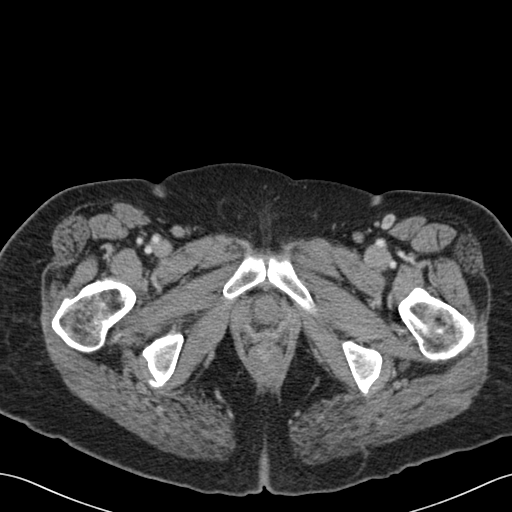
[im 33/153  soft-tissue]
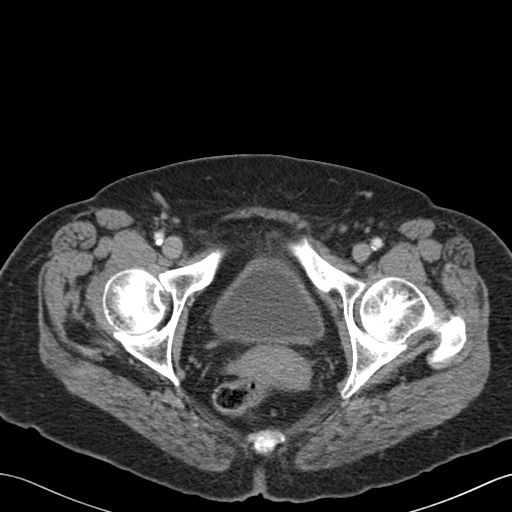
[im 44/153  soft-tissue]
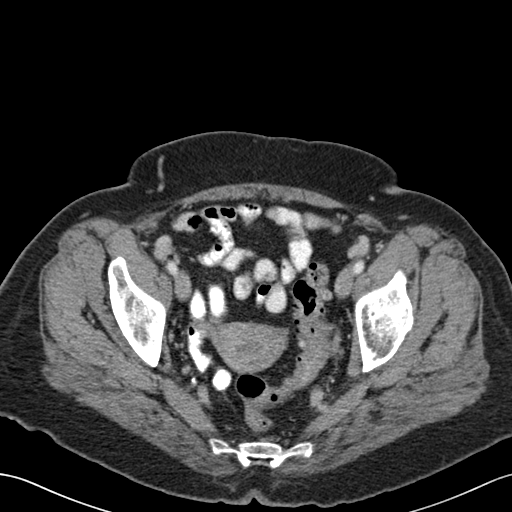
[im 55/153  soft-tissue]
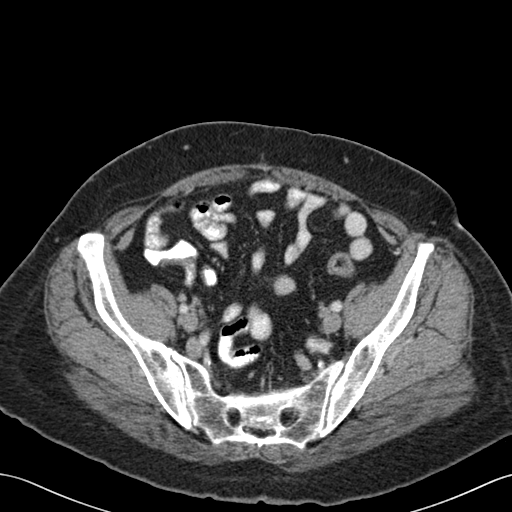
[im 66/153  soft-tissue]
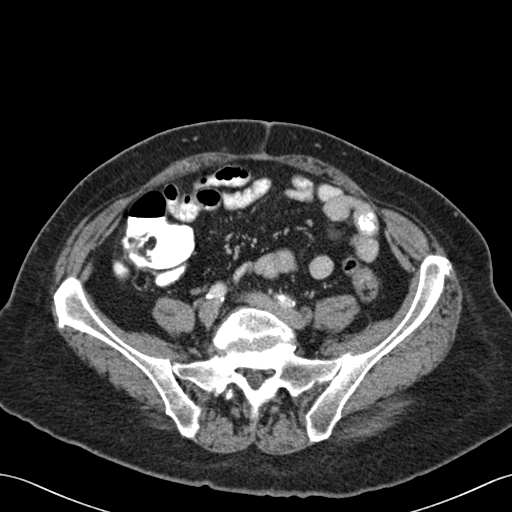
[im 77/153  soft-tissue]
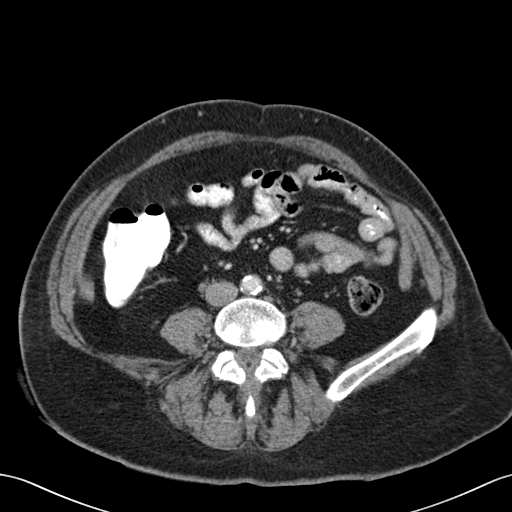
[im 87/153  soft-tissue]
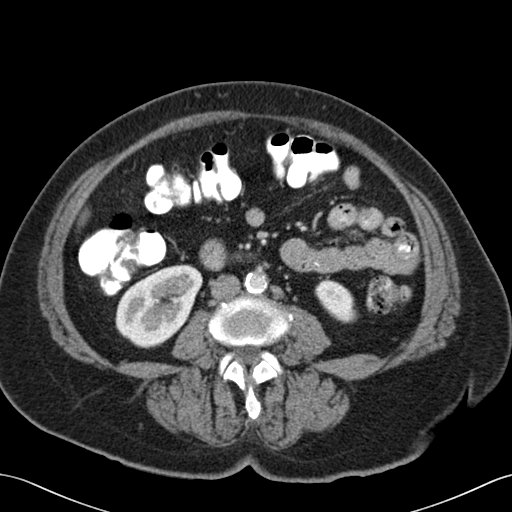
[im 98/153  soft-tissue]
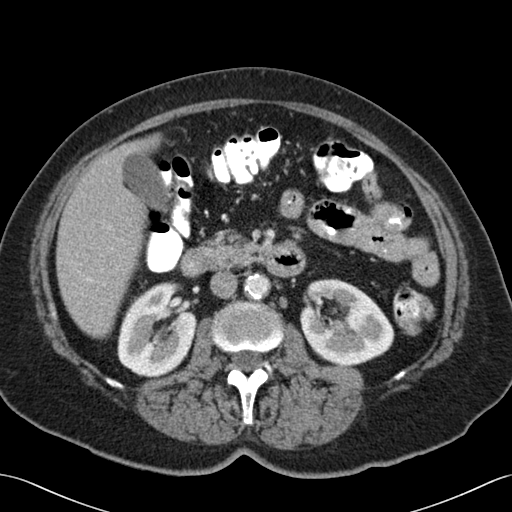
[im 98/153  bone]
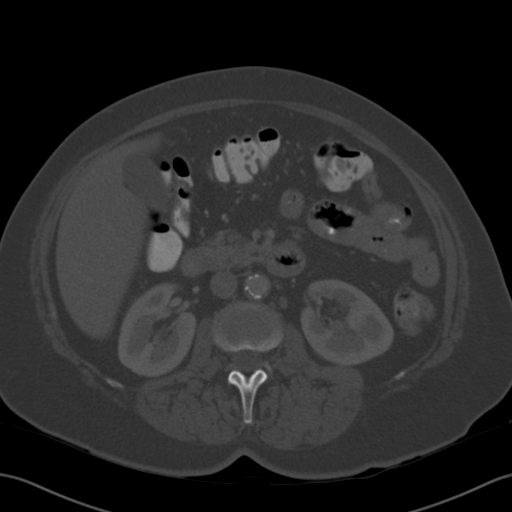
[im 109/153  soft-tissue]
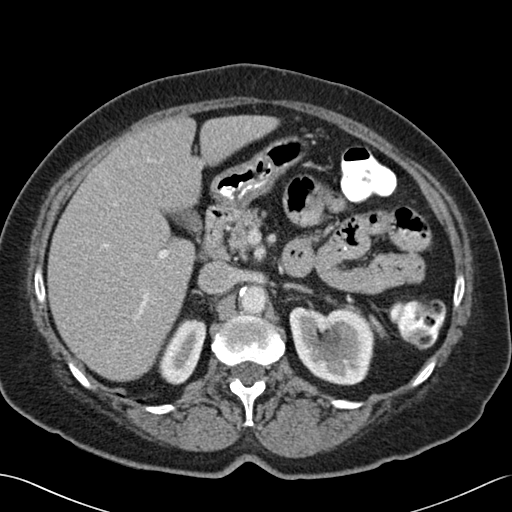
[im 109/153  lung]
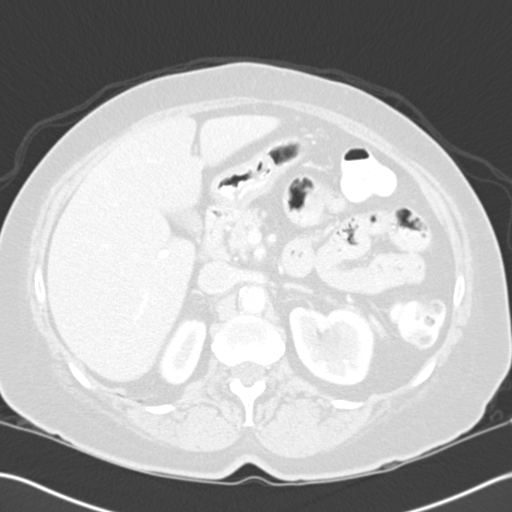
[im 120/153  soft-tissue]
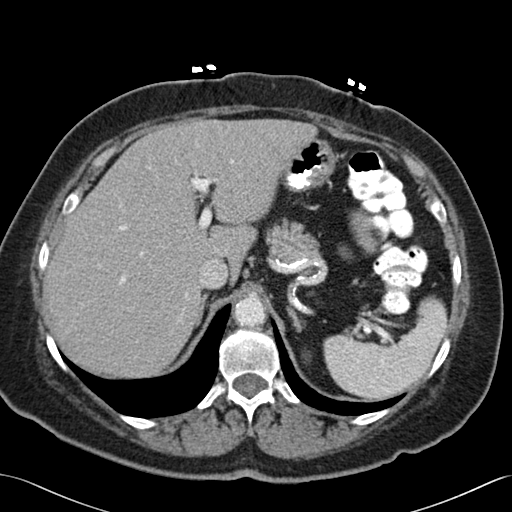
[im 120/153  lung]
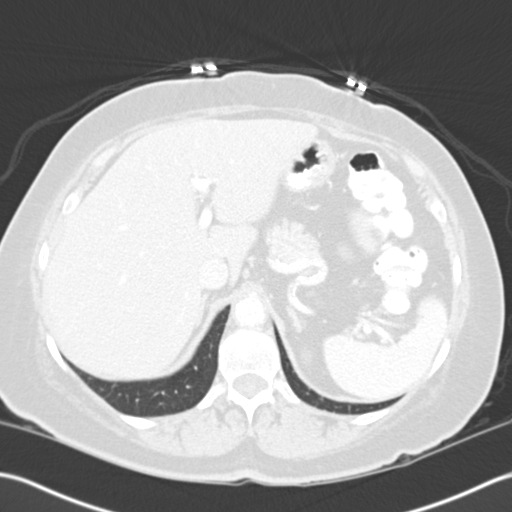
[im 131/153  soft-tissue]
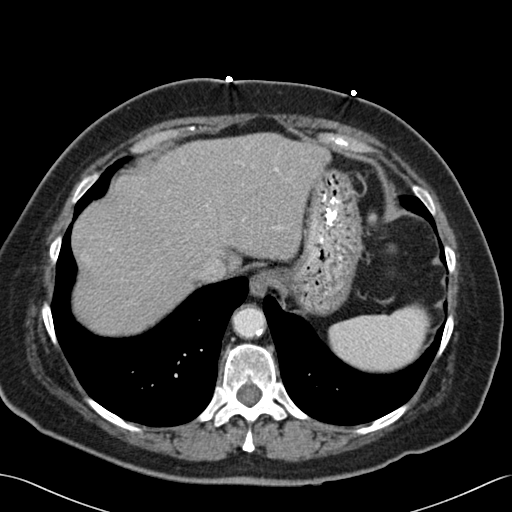
[im 131/153  lung]
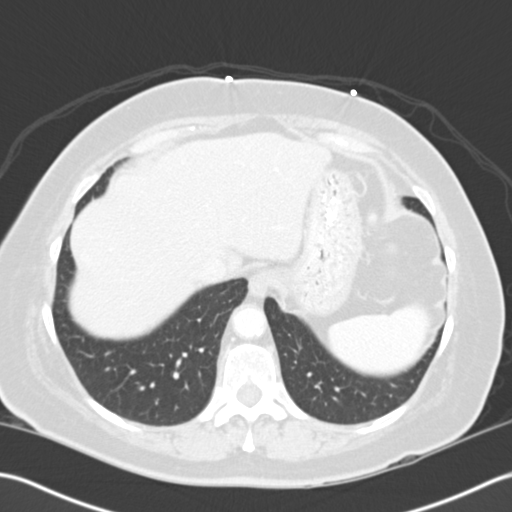
[im 142/153  soft-tissue]
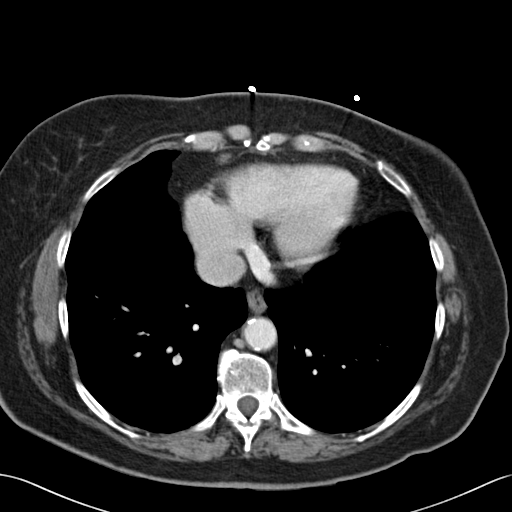
[im 142/153  lung]
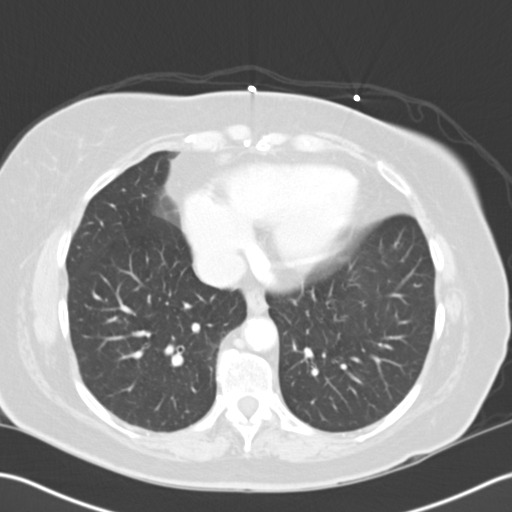

[13 of 32 positions shown; findings below may reference images not displayed]

FINDINGS: Minimal low-attenuation along the falciform ligament likely represents focal
fatty deposition. The liver is otherwise unremarkable. The gallbladder,
spleen, adrenals, and pancreas are unremarkable. There is mild nodularity of
the left adrenal gland, without discrete mass. The kidneys enhance normally.
There is a parapelvic cyst in the left kidney. Small low-attenuation lesion
in the inferior pole of the left kidney is too small to characterize. No
hydronephrosis. On the delayed phase images, there is minimal extrinsic mass
effect on the right renal pelvis by the fat in the renal hilum.

The small and large bowel are normal in caliber. There is mild
diverticulosis of the sigmoid colon. The appendix is not definitely
visualized. However, there are no inflammatory changes at the base of the
cecum. There is a trace amount of free fluid in the pelvis, which is
nonspecific.

No aggressive lytic or sclerotic osseous lesions identified. There are
chronic appearing pars defects at L5 bilaterally.
IMPRESSION: 1. Trace amount of free fluid in the pelvis, which is nonspecific.
2. Mild diverticulosis of the sigmoid colon.

## 2012-11-28 ENCOUNTER — Emergency Department: Payer: Self-pay | Admitting: Emergency Medicine

## 2012-11-28 LAB — COMPREHENSIVE METABOLIC PANEL
Alkaline Phosphatase: 118 U/L (ref 50–136)
Bilirubin,Total: 0.3 mg/dL (ref 0.2–1.0)
Calcium, Total: 8.9 mg/dL (ref 8.5–10.1)
Chloride: 107 mmol/L (ref 98–107)
Creatinine: 0.73 mg/dL (ref 0.60–1.30)
Glucose: 120 mg/dL — ABNORMAL HIGH (ref 65–99)
Osmolality: 284 (ref 275–301)
Potassium: 4.2 mmol/L (ref 3.5–5.1)
Sodium: 140 mmol/L (ref 136–145)
Total Protein: 7.3 g/dL (ref 6.4–8.2)

## 2012-11-28 LAB — CBC
HCT: 38.6 % (ref 35.0–47.0)
MCHC: 33.1 g/dL (ref 32.0–36.0)
MCV: 84 fL (ref 80–100)

## 2012-12-07 ENCOUNTER — Emergency Department: Payer: Self-pay | Admitting: Emergency Medicine

## 2012-12-07 LAB — URINALYSIS, COMPLETE
Blood: NEGATIVE
Glucose,UR: NEGATIVE mg/dL (ref 0–75)
Ketone: NEGATIVE
Protein: NEGATIVE
RBC,UR: NONE SEEN /HPF (ref 0–5)
Squamous Epithelial: 1
WBC UR: 1 /HPF (ref 0–5)

## 2012-12-07 LAB — BASIC METABOLIC PANEL
Co2: 29 mmol/L (ref 21–32)
Creatinine: 0.78 mg/dL (ref 0.60–1.30)
EGFR (African American): >60
EGFR (Non-African Amer.): >60
Osmolality: 283 (ref 275–301)

## 2012-12-07 LAB — CBC
HCT: 34.4 % — ABNORMAL LOW (ref 35.0–47.0)
HGB: 11.6 g/dL — ABNORMAL LOW (ref 12.0–16.0)
MCH: 28.1 pg (ref 26.0–34.0)
MCHC: 33.7 g/dL (ref 32.0–36.0)
MCV: 84 fL (ref 80–100)
Platelet: 220 10*3/uL (ref 150–440)
RBC: 4.11 10*6/uL (ref 3.80–5.20)
RDW: 15.3 % — ABNORMAL HIGH (ref 11.5–14.5)

## 2012-12-07 LAB — TROPONIN I: Troponin-I: <0.02 ng/mL

## 2012-12-11 IMAGING — CR DG SHOULDER 3+V*L*
1 series · 3 of 3 positions shown · non-contrast
Comparison: none

REASON FOR EXAM: l shoulder pain
COMMENTS:

PROCEDURE:     DXR - DXR SHOULDER LEFT COMPLETE  - September 29, 2010  [DATE]
RESULT:     No acute bony or joint abnormality is identified. There is no
evidence of fracture. Degenerative change is present.

[Series 1: view not recorded · 0.17mm/px · 3 of 3 slices shown]
[im 1/3]
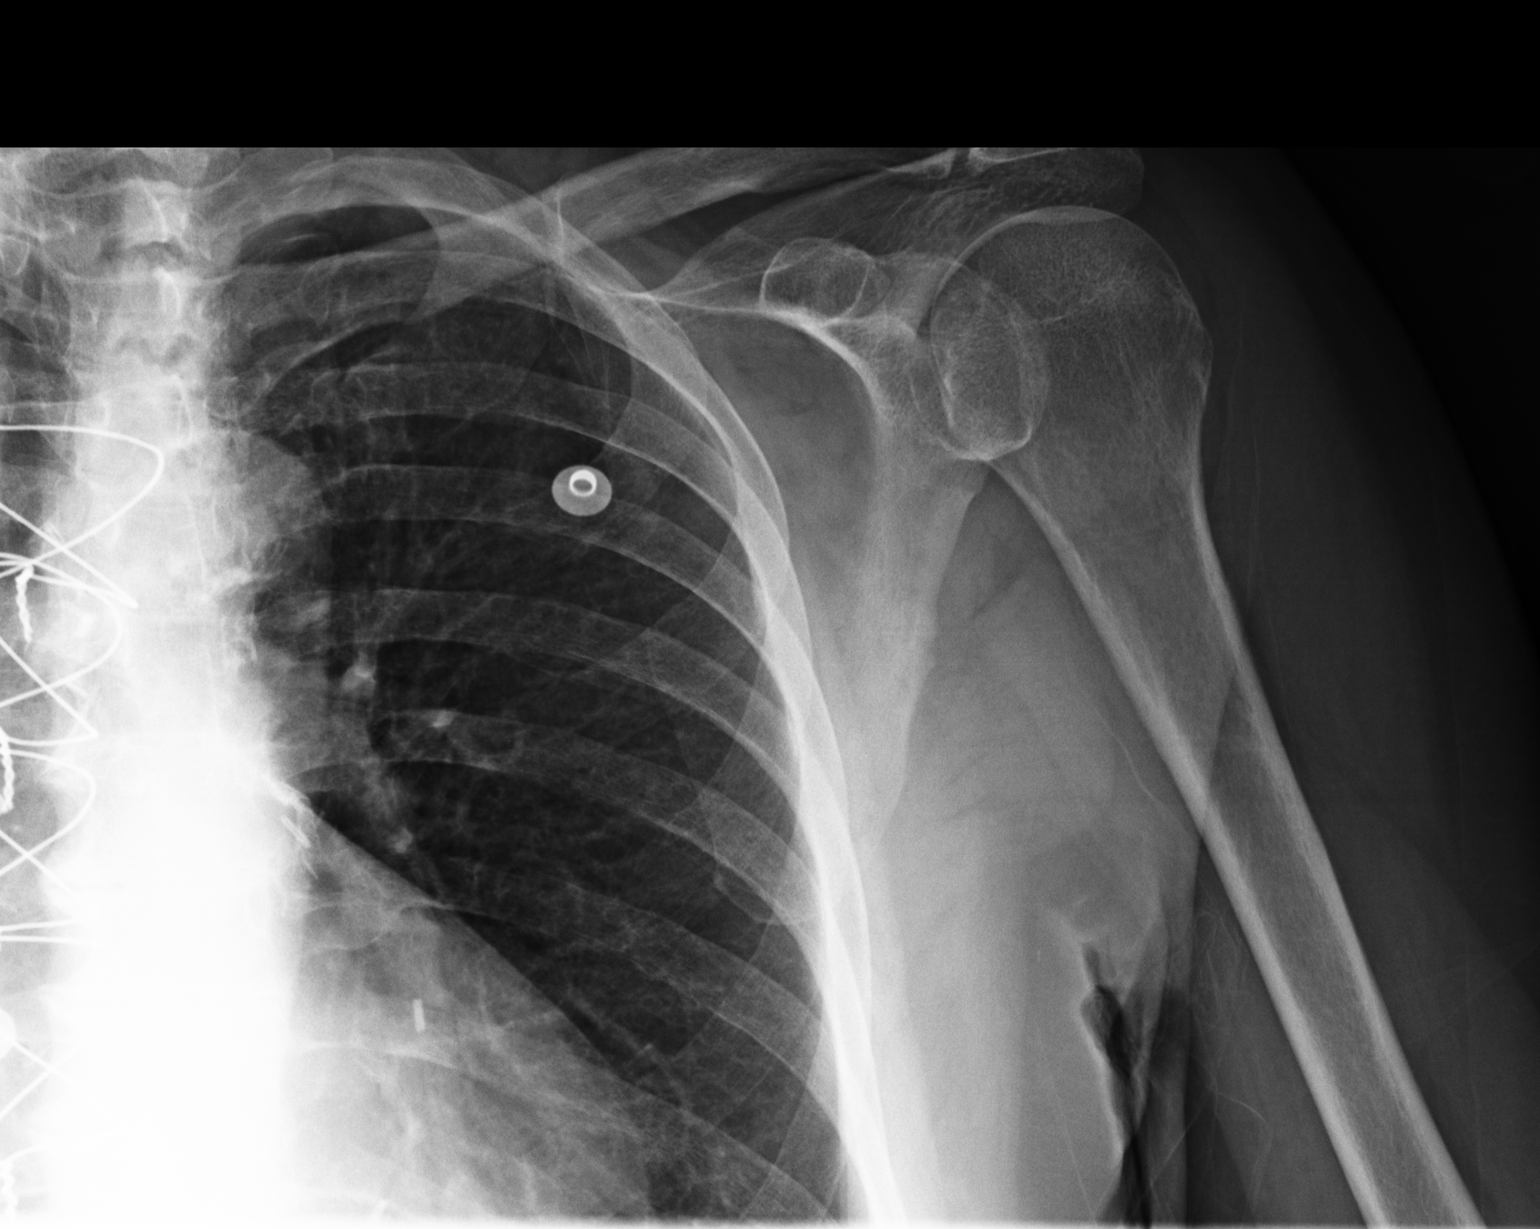
[im 2/3]
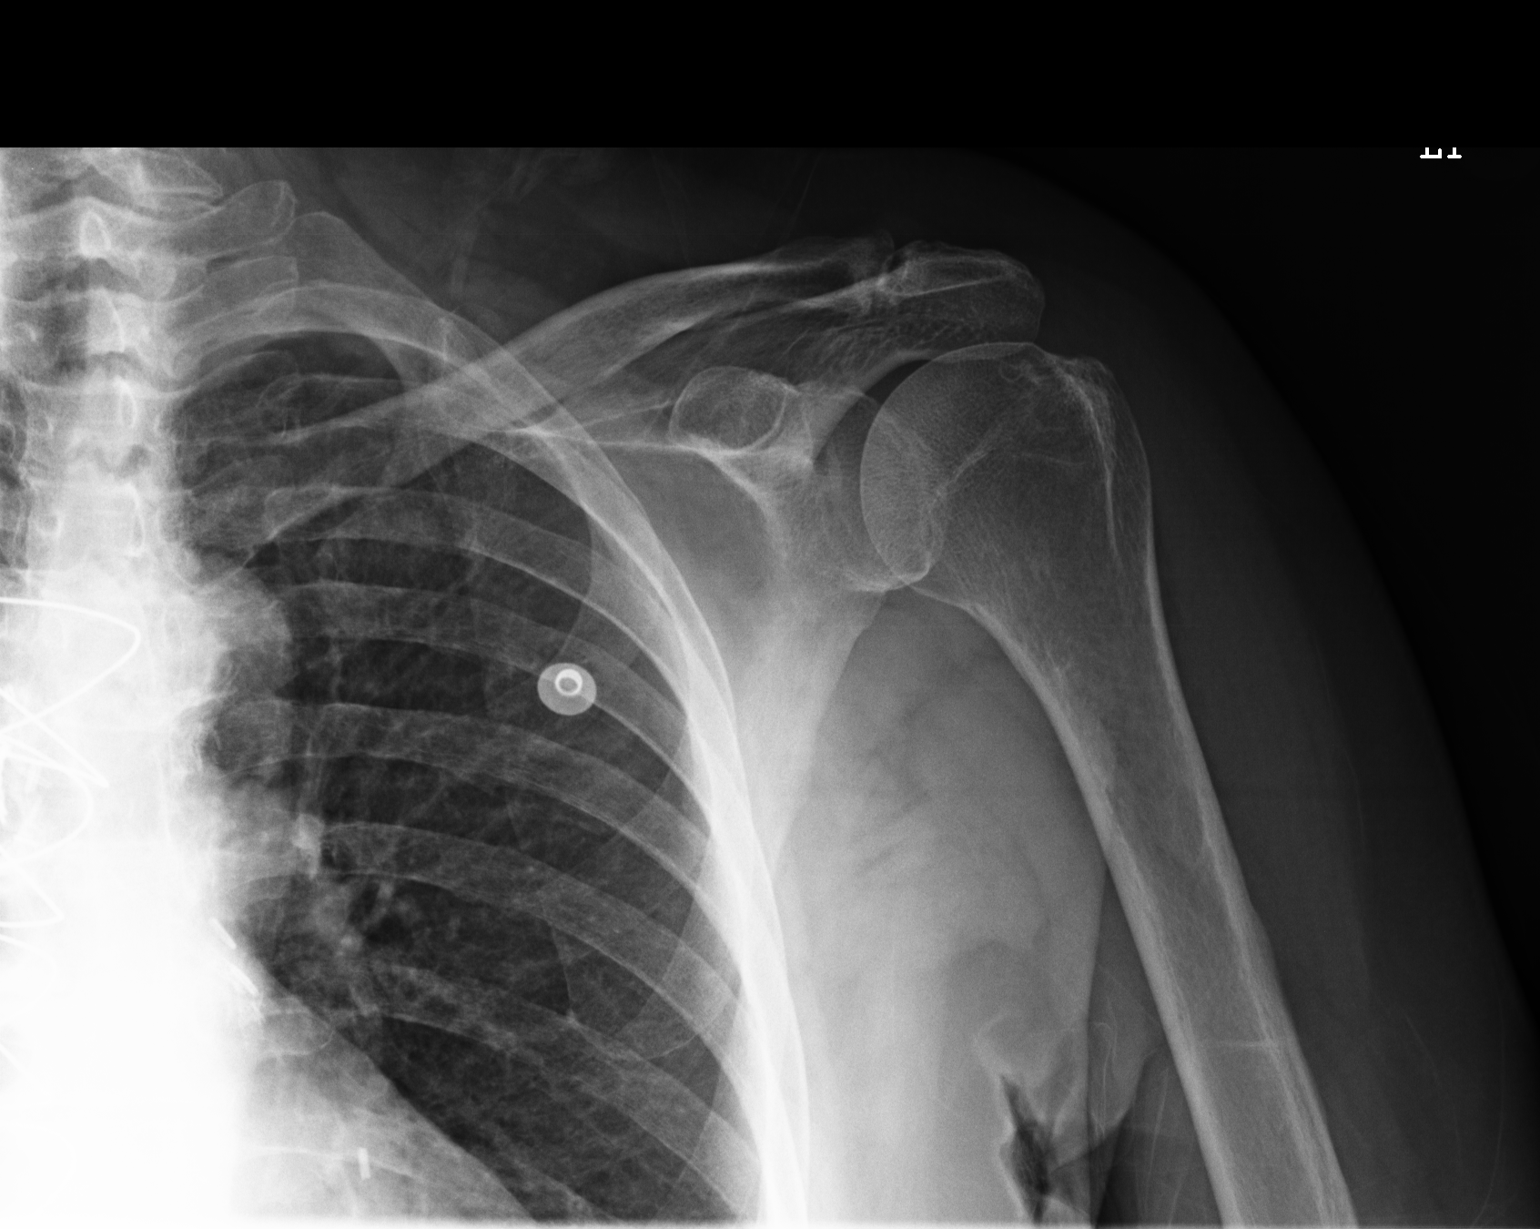
[im 3/3]
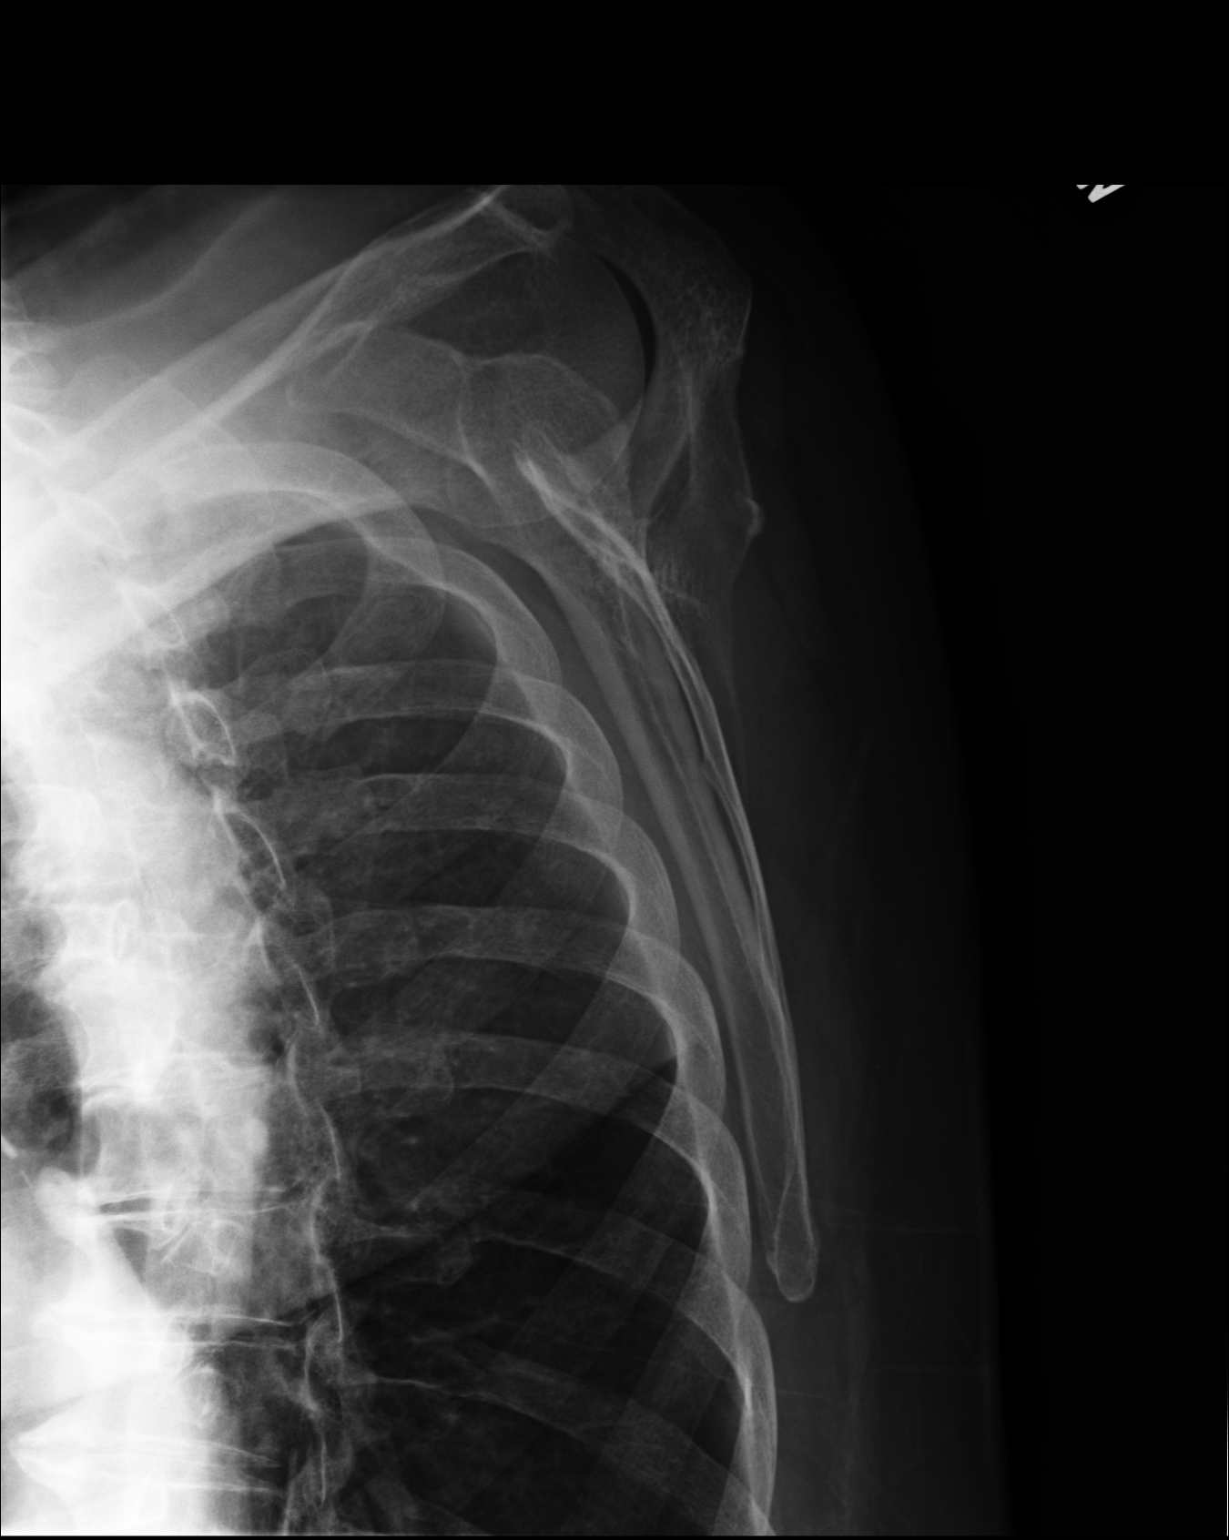

[3 of 3 positions shown; findings below may reference images not displayed]

IMPRESSION: No acute abnormality.

## 2012-12-11 IMAGING — CR DG CHEST 2V
1 series · 2 of 2 positions shown · non-contrast
Comparison: none

REASON FOR EXAM: l arm pain
COMMENTS:

PROCEDURE:     DXR - DXR CHEST PA (OR AP) AND LATERAL  - September 29, 2010  [DATE]
RESULT:     The lungs are clear. The patient has had a prior CABG.
Degenerative change is noted of the thoracic spine with diffuse osteopenia.

[Series 1: view not recorded · 0.17mm/px · 2 of 2 slices shown]
[im 1/2]
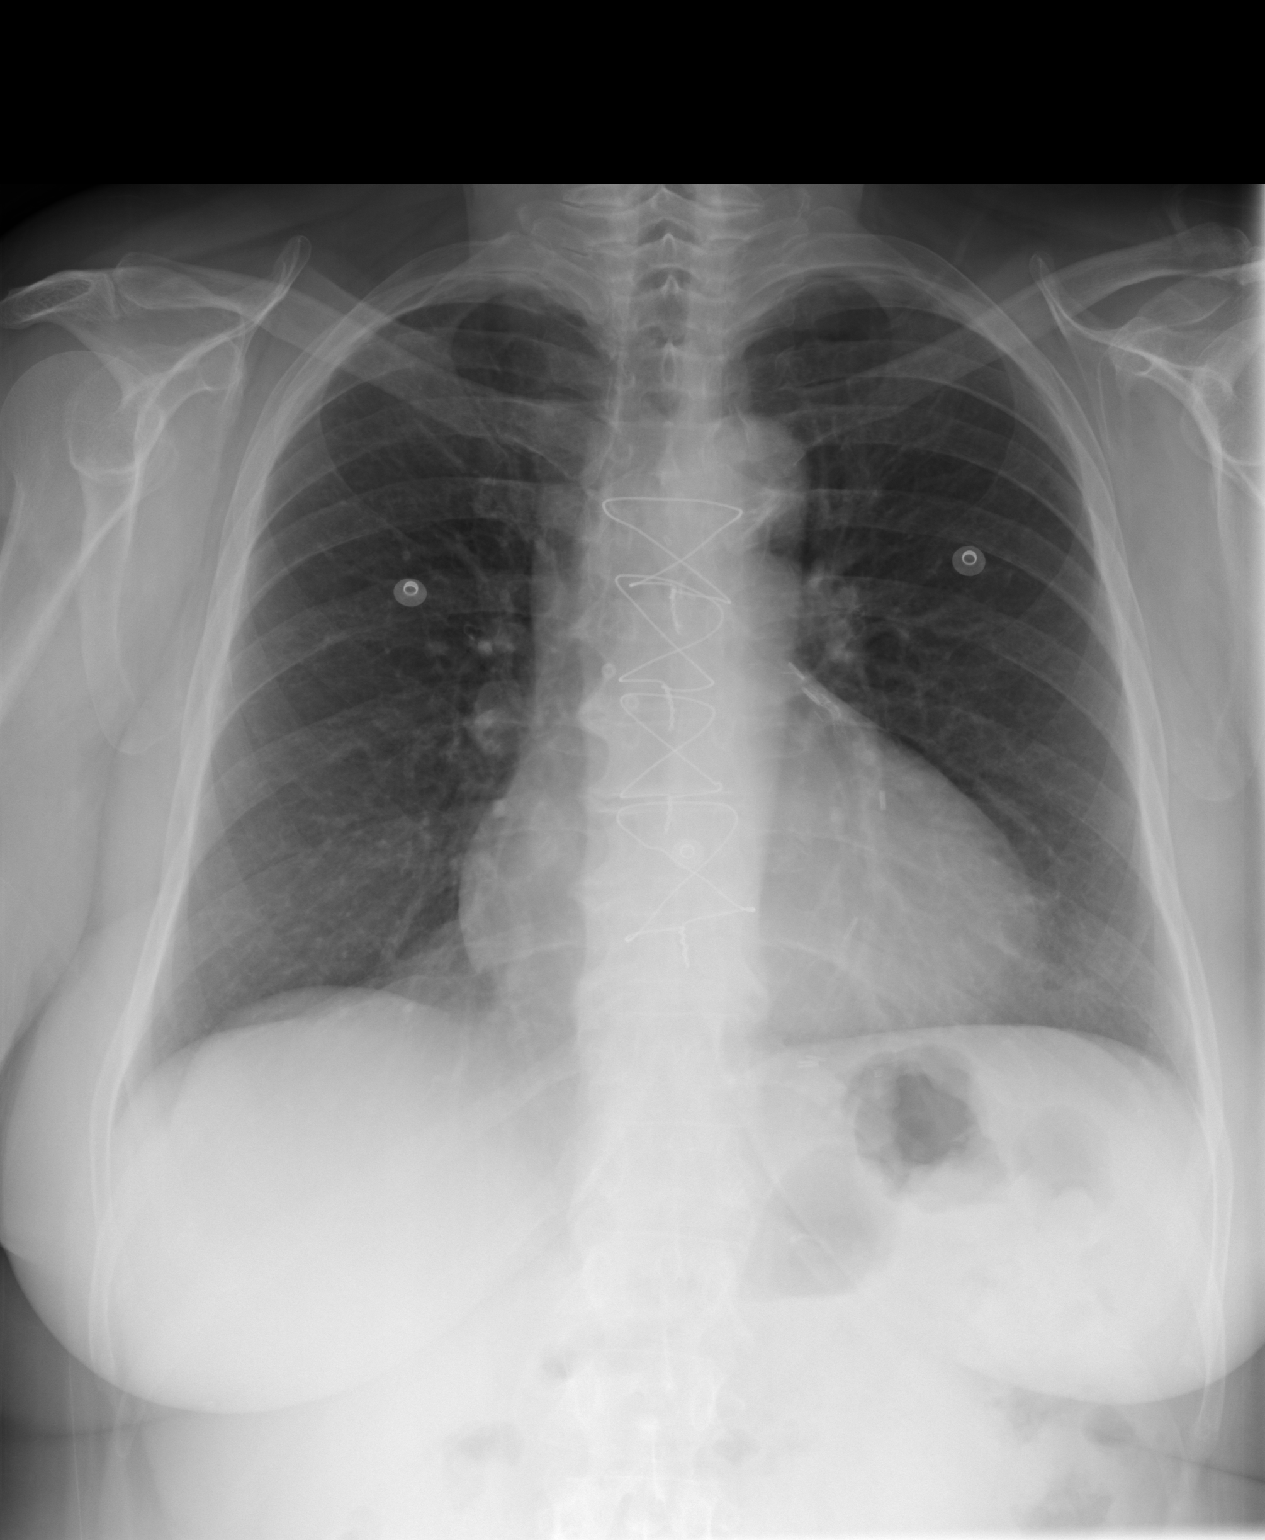
[im 2/2]
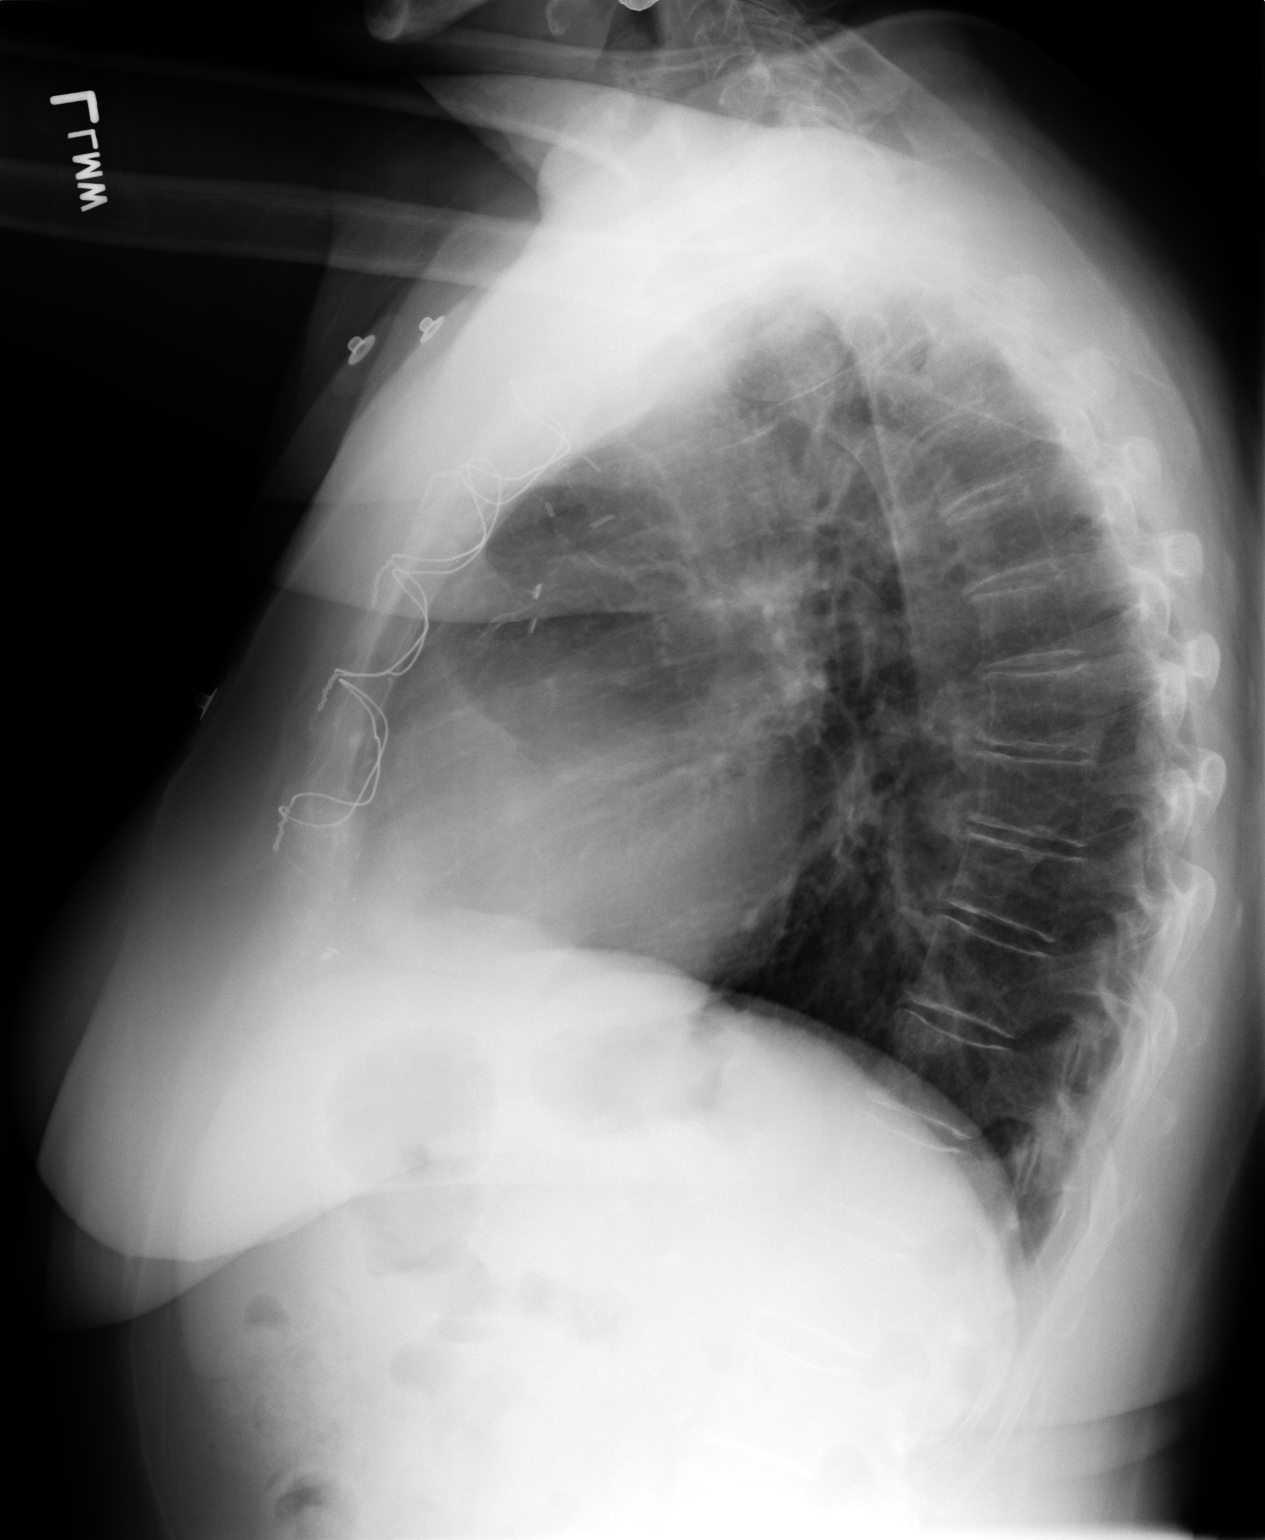

[2 of 2 positions shown; findings below may reference images not displayed]

IMPRESSION: No acute abnormality.

## 2012-12-12 ENCOUNTER — Emergency Department: Payer: Self-pay | Admitting: Emergency Medicine

## 2012-12-12 LAB — BASIC METABOLIC PANEL
BUN: 20 mg/dL — ABNORMAL HIGH (ref 7–18)
Calcium, Total: 8.8 mg/dL (ref 8.5–10.1)
Chloride: 109 mmol/L — ABNORMAL HIGH (ref 98–107)
Creatinine: 0.78 mg/dL (ref 0.60–1.30)
EGFR (African American): >60
Glucose: 137 mg/dL — ABNORMAL HIGH (ref 65–99)
Osmolality: 286 (ref 275–301)
Potassium: 4 mmol/L (ref 3.5–5.1)

## 2012-12-12 LAB — CBC
HCT: 36.3 % (ref 35.0–47.0)
MCH: 27.9 pg (ref 26.0–34.0)
MCHC: 33.2 g/dL (ref 32.0–36.0)
MCV: 84 fL (ref 80–100)
Platelet: 240 10*3/uL (ref 150–440)
WBC: 9 10*3/uL (ref 3.6–11.0)

## 2012-12-12 LAB — TROPONIN I: Troponin-I: <0.02 ng/mL

## 2012-12-16 IMAGING — CR DG CHEST 2V
1 series · 2 of 2 positions shown · non-contrast
Comparison: none

REASON FOR EXAM: pain
COMMENTS:

PROCEDURE:     DXR - DXR CHEST PA (OR AP) AND LATERAL  - October 04, 2010 [DATE]
RESULT:     Comparison: 09/29/2010

[Series 1: view not recorded · 0.17mm/px · 2 of 2 slices shown]
[im 1/2]
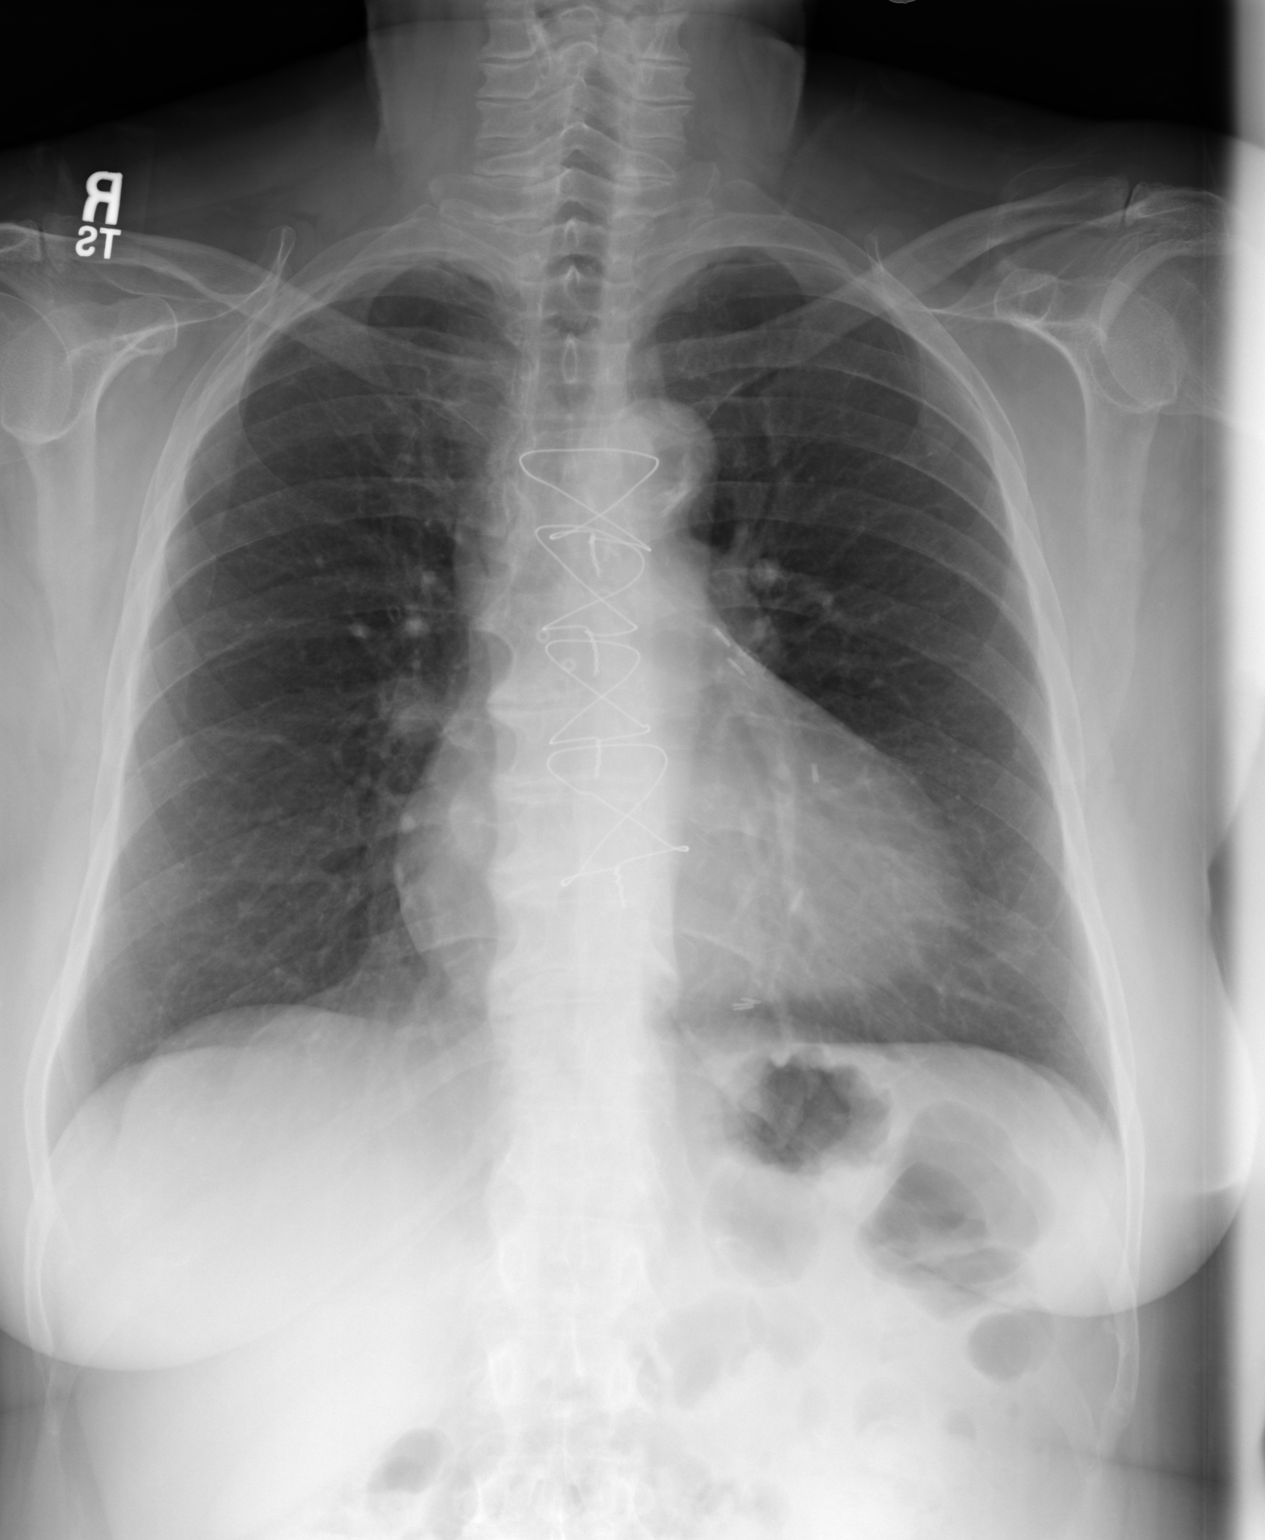
[im 2/2]
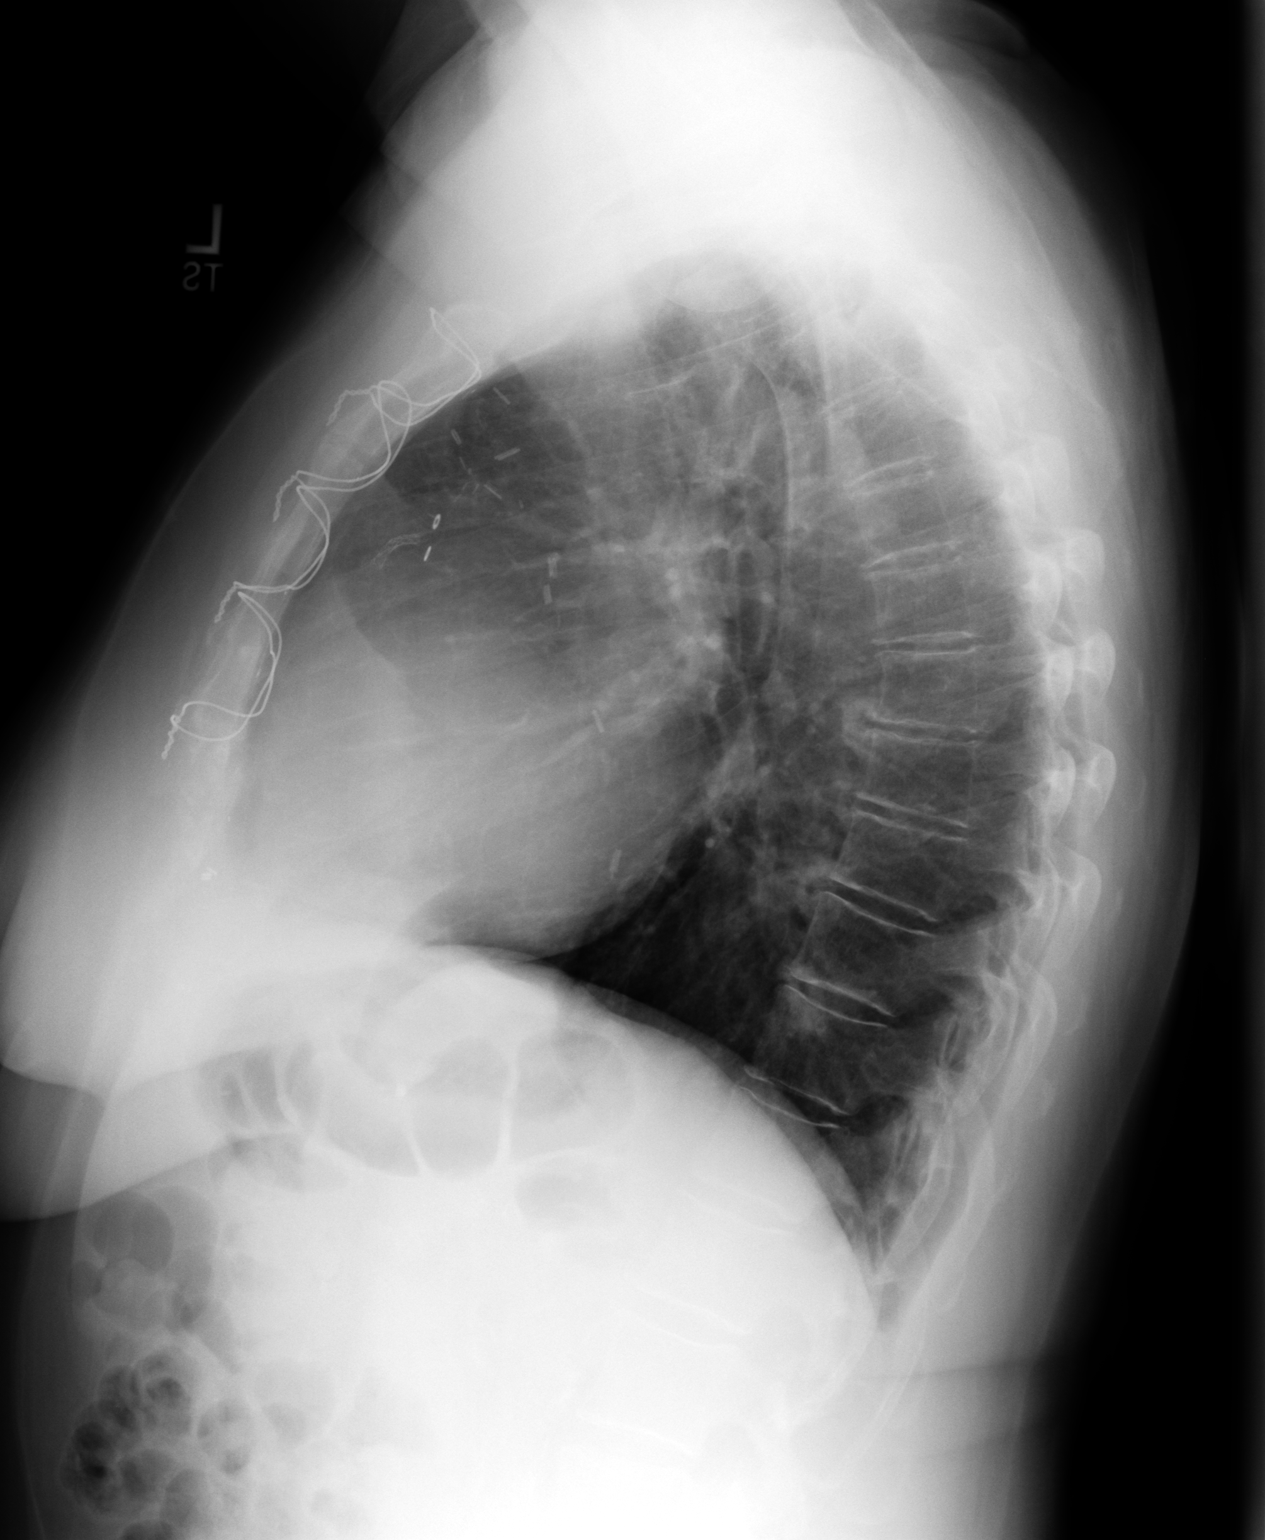

[2 of 2 positions shown; findings below may reference images not displayed]

FINDINGS: Heart and mediastinum are stable. Prior median sternotomy and CABG. The
lungs are clear.
IMPRESSION: No acute cardiopulmonary disease.

## 2012-12-20 ENCOUNTER — Emergency Department: Payer: Self-pay | Admitting: Emergency Medicine

## 2012-12-20 LAB — URINALYSIS, COMPLETE
Bacteria: NONE SEEN
Blood: NEGATIVE
Ketone: NEGATIVE
Nitrite: NEGATIVE
Ph: 5 (ref 4.5–8.0)
Protein: NEGATIVE
RBC,UR: 2 /HPF (ref 0–5)
Specific Gravity: 1.024 (ref 1.003–1.030)
Squamous Epithelial: 1

## 2012-12-20 LAB — BASIC METABOLIC PANEL
BUN: 25 mg/dL — ABNORMAL HIGH (ref 7–18)
Calcium, Total: 9 mg/dL (ref 8.5–10.1)
Chloride: 109 mmol/L — ABNORMAL HIGH (ref 98–107)
Co2: 26 mmol/L (ref 21–32)
Creatinine: 0.8 mg/dL (ref 0.60–1.30)
EGFR (Non-African Amer.): >60
Glucose: 163 mg/dL — ABNORMAL HIGH (ref 65–99)
Potassium: 3.9 mmol/L (ref 3.5–5.1)
Sodium: 141 mmol/L (ref 136–145)

## 2012-12-20 LAB — CK TOTAL AND CKMB (NOT AT ARMC): CK-MB: 0.7 ng/mL (ref 0.5–3.6)

## 2012-12-20 LAB — CBC
HGB: 12.1 g/dL (ref 12.0–16.0)
MCH: 28.4 pg (ref 26.0–34.0)
MCHC: 33.6 g/dL (ref 32.0–36.0)
Platelet: 256 10*3/uL (ref 150–440)
RBC: 4.24 10*6/uL (ref 3.80–5.20)
WBC: 8.9 10*3/uL (ref 3.6–11.0)

## 2012-12-28 ENCOUNTER — Emergency Department: Payer: Self-pay | Admitting: Emergency Medicine

## 2012-12-28 LAB — CBC
HCT: 35.8 % (ref 35.0–47.0)
MCH: 27.7 pg (ref 26.0–34.0)
Platelet: 268 10*3/uL (ref 150–440)
RDW: 15.1 % — ABNORMAL HIGH (ref 11.5–14.5)
WBC: 9 10*3/uL (ref 3.6–11.0)

## 2012-12-28 LAB — COMPREHENSIVE METABOLIC PANEL
Albumin: 3.6 g/dL (ref 3.4–5.0)
Alkaline Phosphatase: 111 U/L (ref 50–136)
Anion Gap: 4 — ABNORMAL LOW (ref 7–16)
BUN: 23 mg/dL — ABNORMAL HIGH (ref 7–18)
Calcium, Total: 8.8 mg/dL (ref 8.5–10.1)
Chloride: 108 mmol/L — ABNORMAL HIGH (ref 98–107)
Creatinine: 0.78 mg/dL (ref 0.60–1.30)
EGFR (African American): >60
EGFR (Non-African Amer.): >60
Glucose: 112 mg/dL — ABNORMAL HIGH (ref 65–99)
Potassium: 4 mmol/L (ref 3.5–5.1)
SGPT (ALT): 22 U/L (ref 12–78)

## 2012-12-28 LAB — URINALYSIS, COMPLETE
Bilirubin,UR: NEGATIVE
Blood: NEGATIVE
Glucose,UR: NEGATIVE mg/dL (ref 0–75)
Ketone: NEGATIVE
Nitrite: NEGATIVE
Ph: 5 (ref 4.5–8.0)
WBC UR: 10 /HPF (ref 0–5)

## 2012-12-28 LAB — TROPONIN I: Troponin-I: <0.02 ng/mL

## 2013-01-05 ENCOUNTER — Emergency Department: Payer: Self-pay | Admitting: Emergency Medicine

## 2013-01-06 LAB — COMPREHENSIVE METABOLIC PANEL
Albumin: 3.9 g/dL (ref 3.4–5.0)
Alkaline Phosphatase: 131 U/L (ref 50–136)
Anion Gap: 5 — ABNORMAL LOW (ref 7–16)
BUN: 30 mg/dL — ABNORMAL HIGH (ref 7–18)
Bilirubin,Total: 0.2 mg/dL (ref 0.2–1.0)
Calcium, Total: 8.7 mg/dL (ref 8.5–10.1)
Co2: 28 mmol/L (ref 21–32)
Creatinine: 1.1 mg/dL (ref 0.60–1.30)
EGFR (African American): 58 — ABNORMAL LOW
EGFR (Non-African Amer.): 50 — ABNORMAL LOW
Osmolality: 293 (ref 275–301)
SGOT(AST): 21 U/L (ref 15–37)
SGPT (ALT): 29 U/L (ref 12–78)
Sodium: 141 mmol/L (ref 136–145)
Total Protein: 7.2 g/dL (ref 6.4–8.2)

## 2013-01-06 LAB — CBC
Platelet: 260 10*3/uL (ref 150–440)
RDW: 14.7 % — ABNORMAL HIGH (ref 11.5–14.5)

## 2013-01-06 LAB — URINALYSIS, COMPLETE
Bilirubin,UR: NEGATIVE
Blood: NEGATIVE
Glucose,UR: NEGATIVE mg/dL (ref 0–75)
Ketone: NEGATIVE
Leukocyte Esterase: NEGATIVE
Nitrite: NEGATIVE
Ph: 5 (ref 4.5–8.0)
RBC,UR: 3 /HPF (ref 0–5)
Specific Gravity: 1.024 (ref 1.003–1.030)
Squamous Epithelial: 1
WBC UR: 3 /HPF (ref 0–5)

## 2013-01-06 LAB — TROPONIN I: Troponin-I: <0.02 ng/mL

## 2013-01-06 LAB — LIPASE, BLOOD: Lipase: 85 U/L (ref 73–393)

## 2013-01-18 ENCOUNTER — Emergency Department: Payer: Self-pay | Admitting: Emergency Medicine

## 2013-01-18 LAB — COMPREHENSIVE METABOLIC PANEL
Albumin: 3.3 g/dL — ABNORMAL LOW (ref 3.4–5.0)
Alkaline Phosphatase: 122 U/L (ref 50–136)
Anion Gap: 5 — ABNORMAL LOW (ref 7–16)
Calcium, Total: 8.6 mg/dL (ref 8.5–10.1)
Chloride: 110 mmol/L — ABNORMAL HIGH (ref 98–107)
Co2: 28 mmol/L (ref 21–32)
Creatinine: 0.69 mg/dL (ref 0.60–1.30)
EGFR (Non-African Amer.): >60
Osmolality: 288 (ref 275–301)
SGOT(AST): 23 U/L (ref 15–37)
Sodium: 143 mmol/L (ref 136–145)
Total Protein: 6.5 g/dL (ref 6.4–8.2)

## 2013-01-18 LAB — CBC
HGB: 11.9 g/dL — ABNORMAL LOW (ref 12.0–16.0)
MCHC: 33.3 g/dL (ref 32.0–36.0)
MCV: 84 fL (ref 80–100)
RBC: 4.24 10*6/uL (ref 3.80–5.20)
WBC: 8.7 10*3/uL (ref 3.6–11.0)

## 2013-01-18 IMAGING — CR DG CHEST 1V PORT
1 series · 1 of 1 positions shown · non-contrast
Comparison: none

REASON FOR EXAM: cp
COMMENTS:

PROCEDURE:     DXR - DXR PORTABLE CHEST SINGLE VIEW  - November 06, 2010  [DATE]
RESULT:     The lungs are clear. Anny cardiomegaly. The patient has had
prior CABG. No congestive heart failure.

[view not recorded]
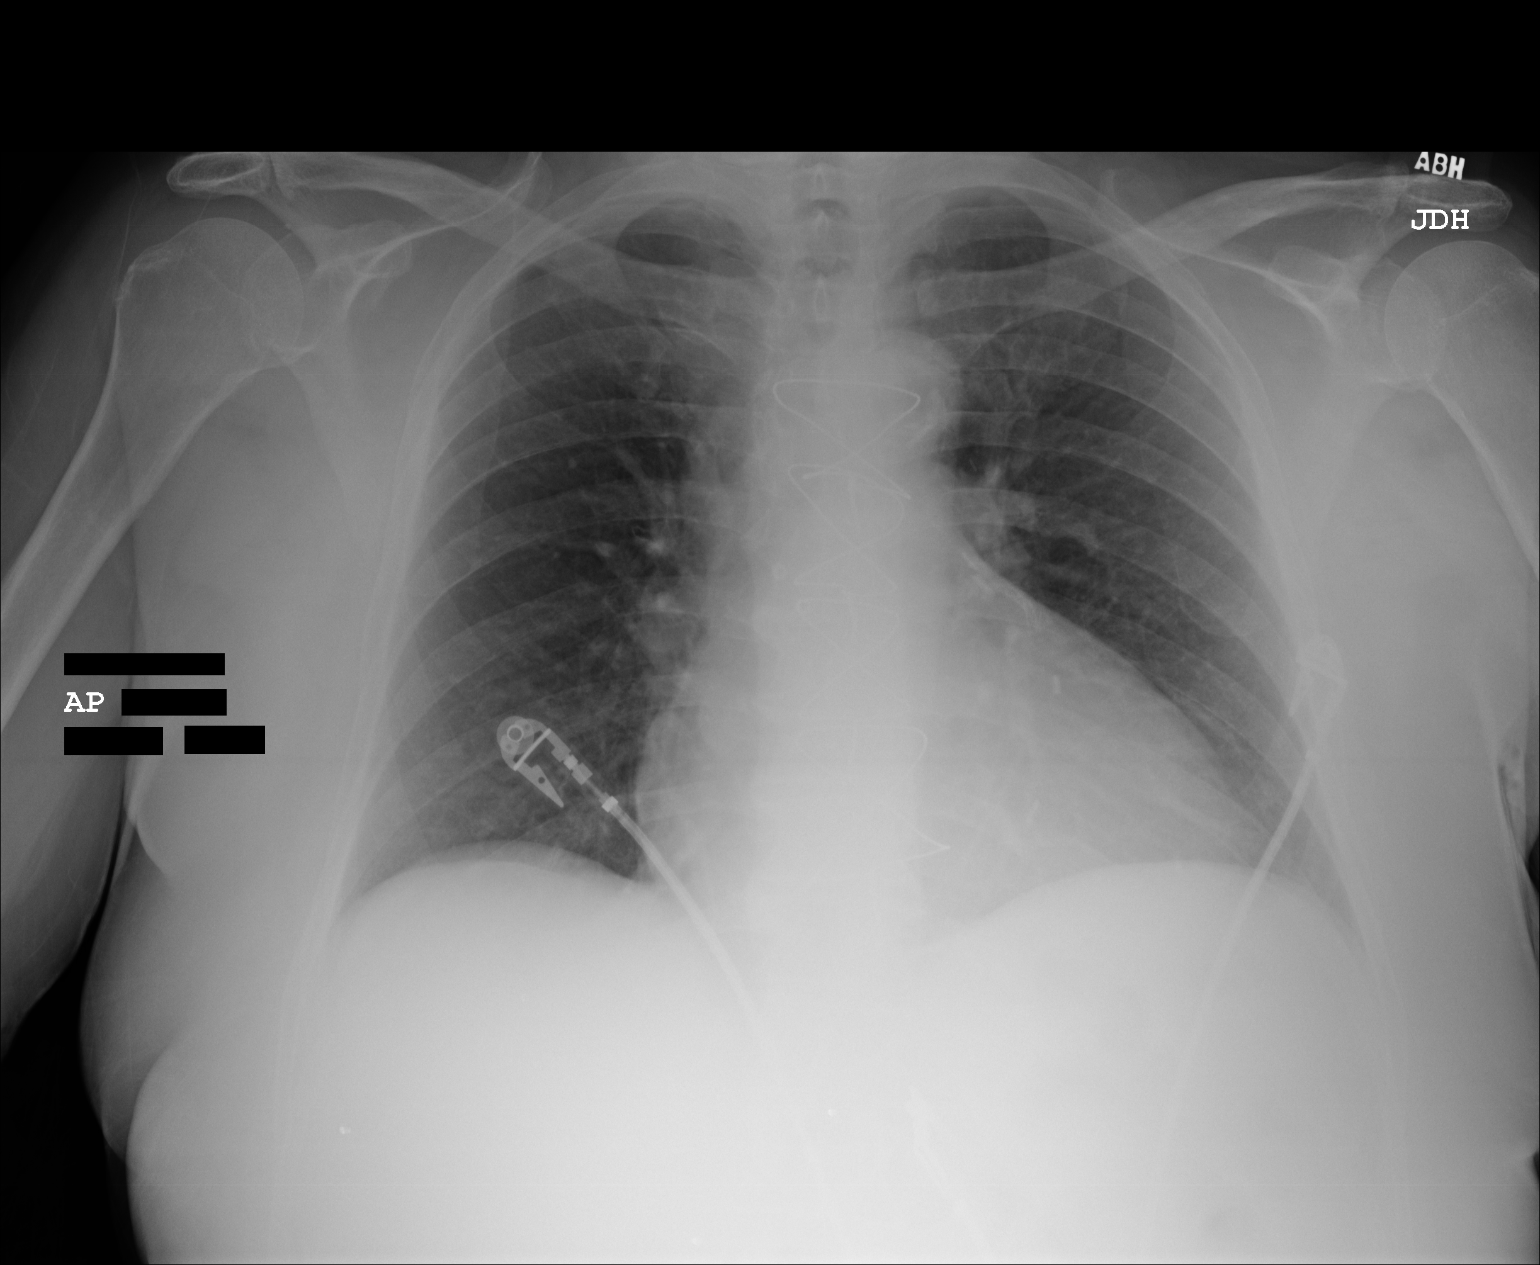

[1 of 1 positions shown; findings below may reference images not displayed]

IMPRESSION: No acute abnormality.

## 2013-02-07 IMAGING — CR DG CHEST 2V
1 series · 2 of 2 positions shown · non-contrast
Comparison: none

REASON FOR EXAM: Shortness of breath
COMMENTS:

[Series 1: view not recorded · 0.17mm/px · 2 of 2 slices shown]
[im 1/2]
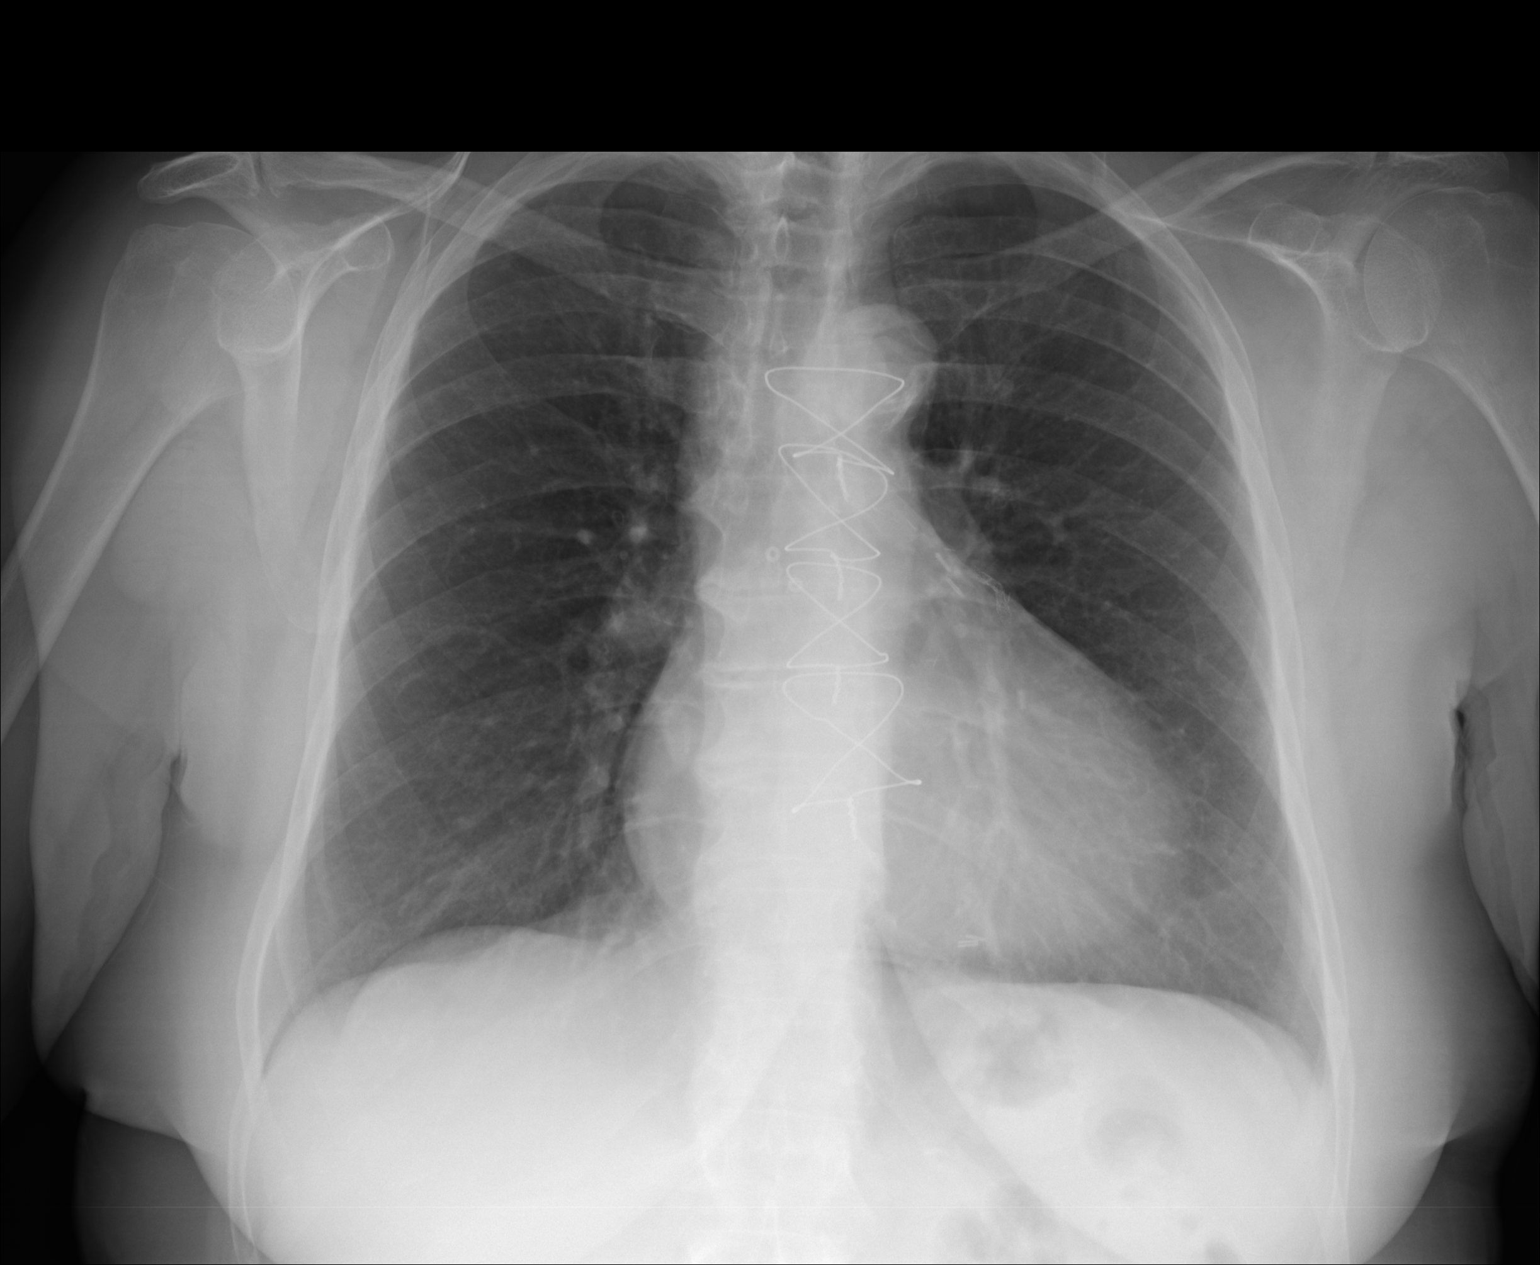
[im 2/2]
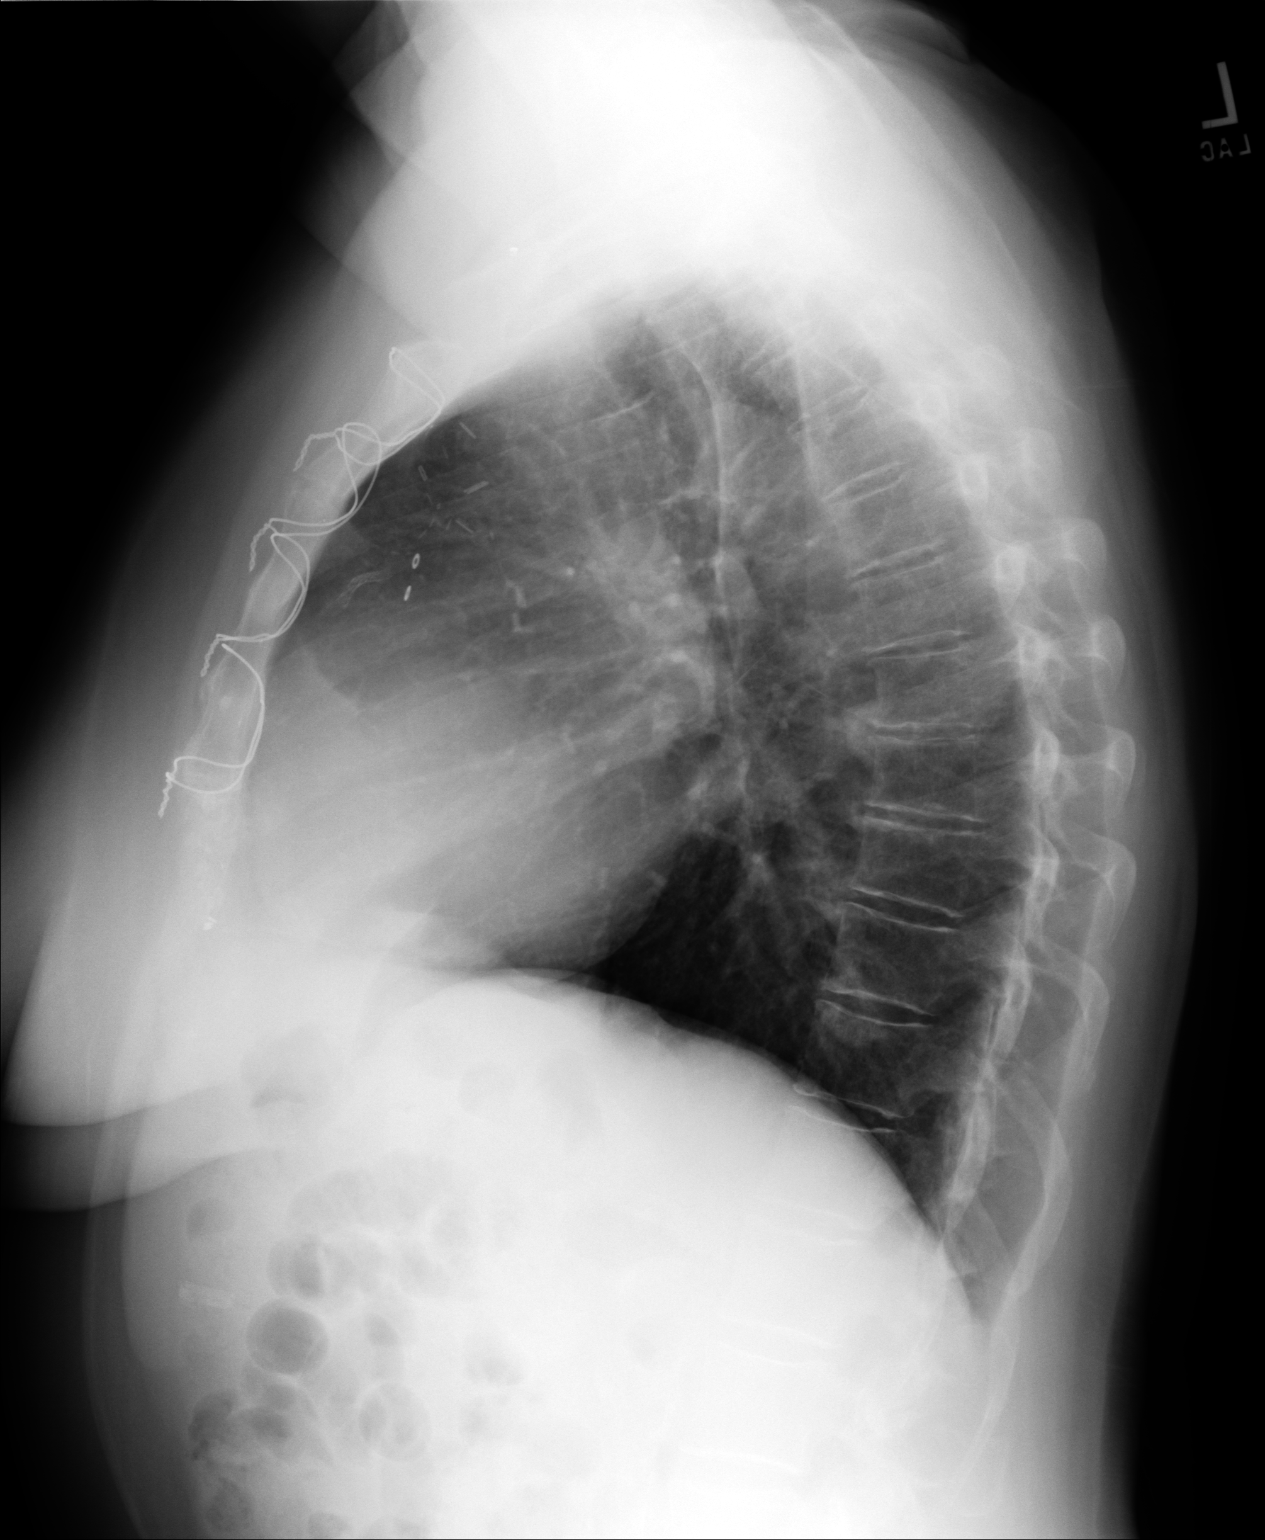

[2 of 2 positions shown; findings below may reference images not displayed]

PROCEDURE:     DXR - DXR CHEST PA (OR AP) AND LATERAL  - November 26, 2010  [DATE]

RESULT:     Comparison is made to prior study dated 11-06-2010.

There is no evidence of focal infiltrates, effusions or edema. The cardiac
silhouette is moderately enlarged. The patient is status post median
sternotomy and coronary artery bypass grafting.
IMPRESSION: Chest radiograph without evidence of acute cardiopulmonary
disease.

## 2013-02-14 ENCOUNTER — Emergency Department: Payer: Self-pay | Admitting: Internal Medicine

## 2013-02-14 LAB — URINALYSIS, COMPLETE
Glucose,UR: NEGATIVE mg/dL (ref 0–75)
Ketone: NEGATIVE
Leukocyte Esterase: NEGATIVE
Ph: 6 (ref 4.5–8.0)
Specific Gravity: 1.008 (ref 1.003–1.030)
WBC UR: 2 /HPF (ref 0–5)

## 2013-02-14 LAB — BASIC METABOLIC PANEL
Anion Gap: 8 (ref 7–16)
BUN: 16 mg/dL (ref 7–18)
Creatinine: 0.72 mg/dL (ref 0.60–1.30)
EGFR (African American): >60
EGFR (Non-African Amer.): >60
Osmolality: 284 (ref 275–301)
Potassium: 4.2 mmol/L (ref 3.5–5.1)
Sodium: 141 mmol/L (ref 136–145)

## 2013-02-14 LAB — HEPATIC FUNCTION PANEL A (ARMC)
Alkaline Phosphatase: 134 U/L — ABNORMAL HIGH
Bilirubin,Total: 0.2 mg/dL (ref 0.2–1.0)
Total Protein: 6.9 g/dL (ref 6.4–8.2)

## 2013-02-14 LAB — CBC
HGB: 11.8 g/dL — ABNORMAL LOW (ref 12.0–16.0)
MCH: 27.7 pg (ref 26.0–34.0)
MCHC: 33.1 g/dL (ref 32.0–36.0)
Platelet: 207 10*3/uL (ref 150–440)
RDW: 14.5 % (ref 11.5–14.5)
WBC: 7.5 10*3/uL (ref 3.6–11.0)

## 2013-02-14 LAB — TROPONIN I: Troponin-I: <0.02 ng/mL

## 2013-02-14 LAB — LIPASE, BLOOD: Lipase: 83 U/L (ref 73–393)

## 2013-05-29 ENCOUNTER — Emergency Department: Payer: Self-pay | Admitting: Emergency Medicine

## 2013-05-29 LAB — BASIC METABOLIC PANEL
Anion Gap: 5 — ABNORMAL LOW (ref 7–16)
BUN: 22 mg/dL — ABNORMAL HIGH (ref 7–18)
Calcium, Total: 8.4 mg/dL — ABNORMAL LOW (ref 8.5–10.1)
Chloride: 109 mmol/L — ABNORMAL HIGH (ref 98–107)
Co2: 24 mmol/L (ref 21–32)
Creatinine: 0.54 mg/dL — ABNORMAL LOW (ref 0.60–1.30)
EGFR (African American): >60
Glucose: 182 mg/dL — ABNORMAL HIGH (ref 65–99)
OSMOLALITY: 284 (ref 275–301)
Potassium: 3.9 mmol/L (ref 3.5–5.1)
Sodium: 138 mmol/L (ref 136–145)

## 2013-05-29 LAB — CBC
HCT: 40.8 % (ref 35.0–47.0)
HGB: 13 g/dL (ref 12.0–16.0)
MCH: 26.7 pg (ref 26.0–34.0)
MCHC: 31.8 g/dL — ABNORMAL LOW (ref 32.0–36.0)
MCV: 84 fL (ref 80–100)
Platelet: 256 10*3/uL (ref 150–440)
RBC: 4.86 10*6/uL (ref 3.80–5.20)
RDW: 15.1 % — AB (ref 11.5–14.5)
WBC: 7.6 10*3/uL (ref 3.6–11.0)

## 2013-05-29 LAB — TROPONIN I
Troponin-I: <0.02 ng/mL
Troponin-I: <0.02 ng/mL

## 2013-05-30 IMAGING — CR DG SHOULDER 3+V*R*
1 series · 3 of 3 positions shown · non-contrast
Comparison: none

REASON FOR EXAM: fall, now painful
COMMENTS:

PROCEDURE:     DXR - DXR SHOULDER RIGHT COMPLETE  - March 18, 2011 [DATE]
RESULT:     Images of the right shoulder demonstrate the humeral head
located in the glenoid. There is no fracture, dislocation or foreign body
demonstrated.

[Series 1: w shoulder external right · 0.14mm/px · 3 of 3 slices shown]
[im 1/3]
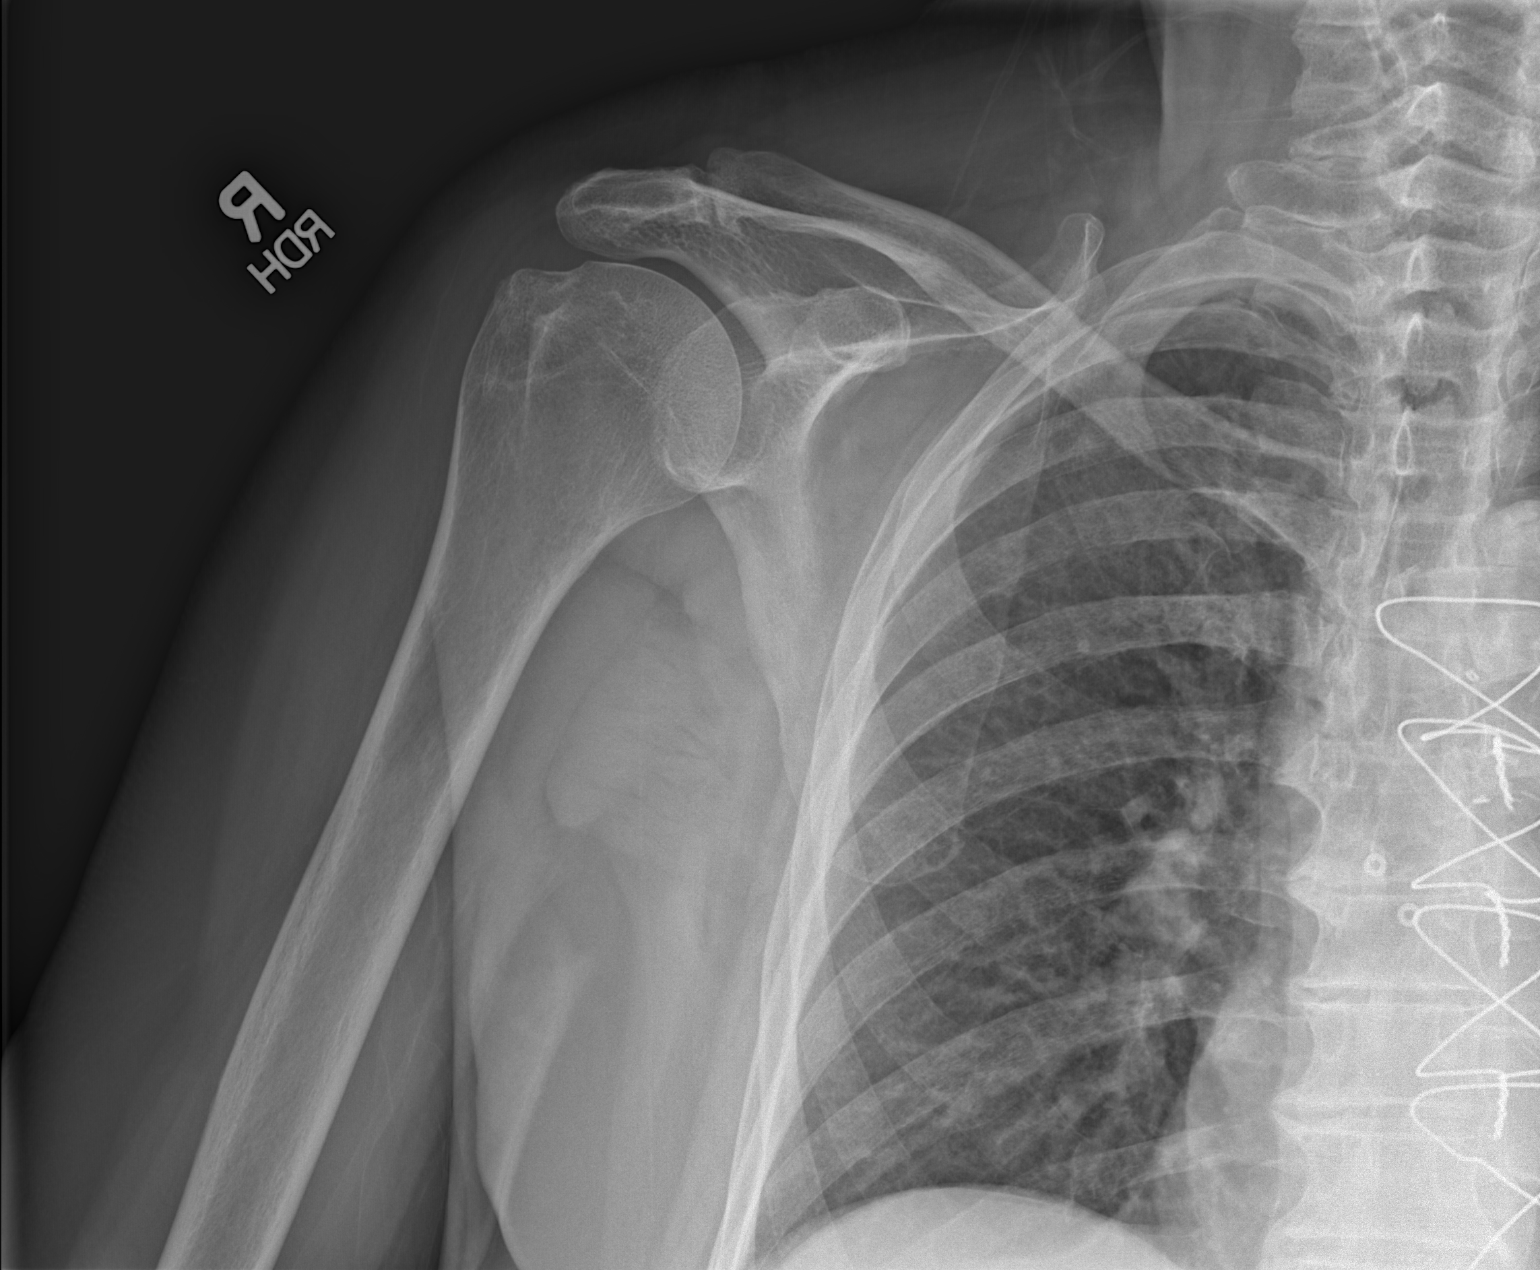
[im 2/3]
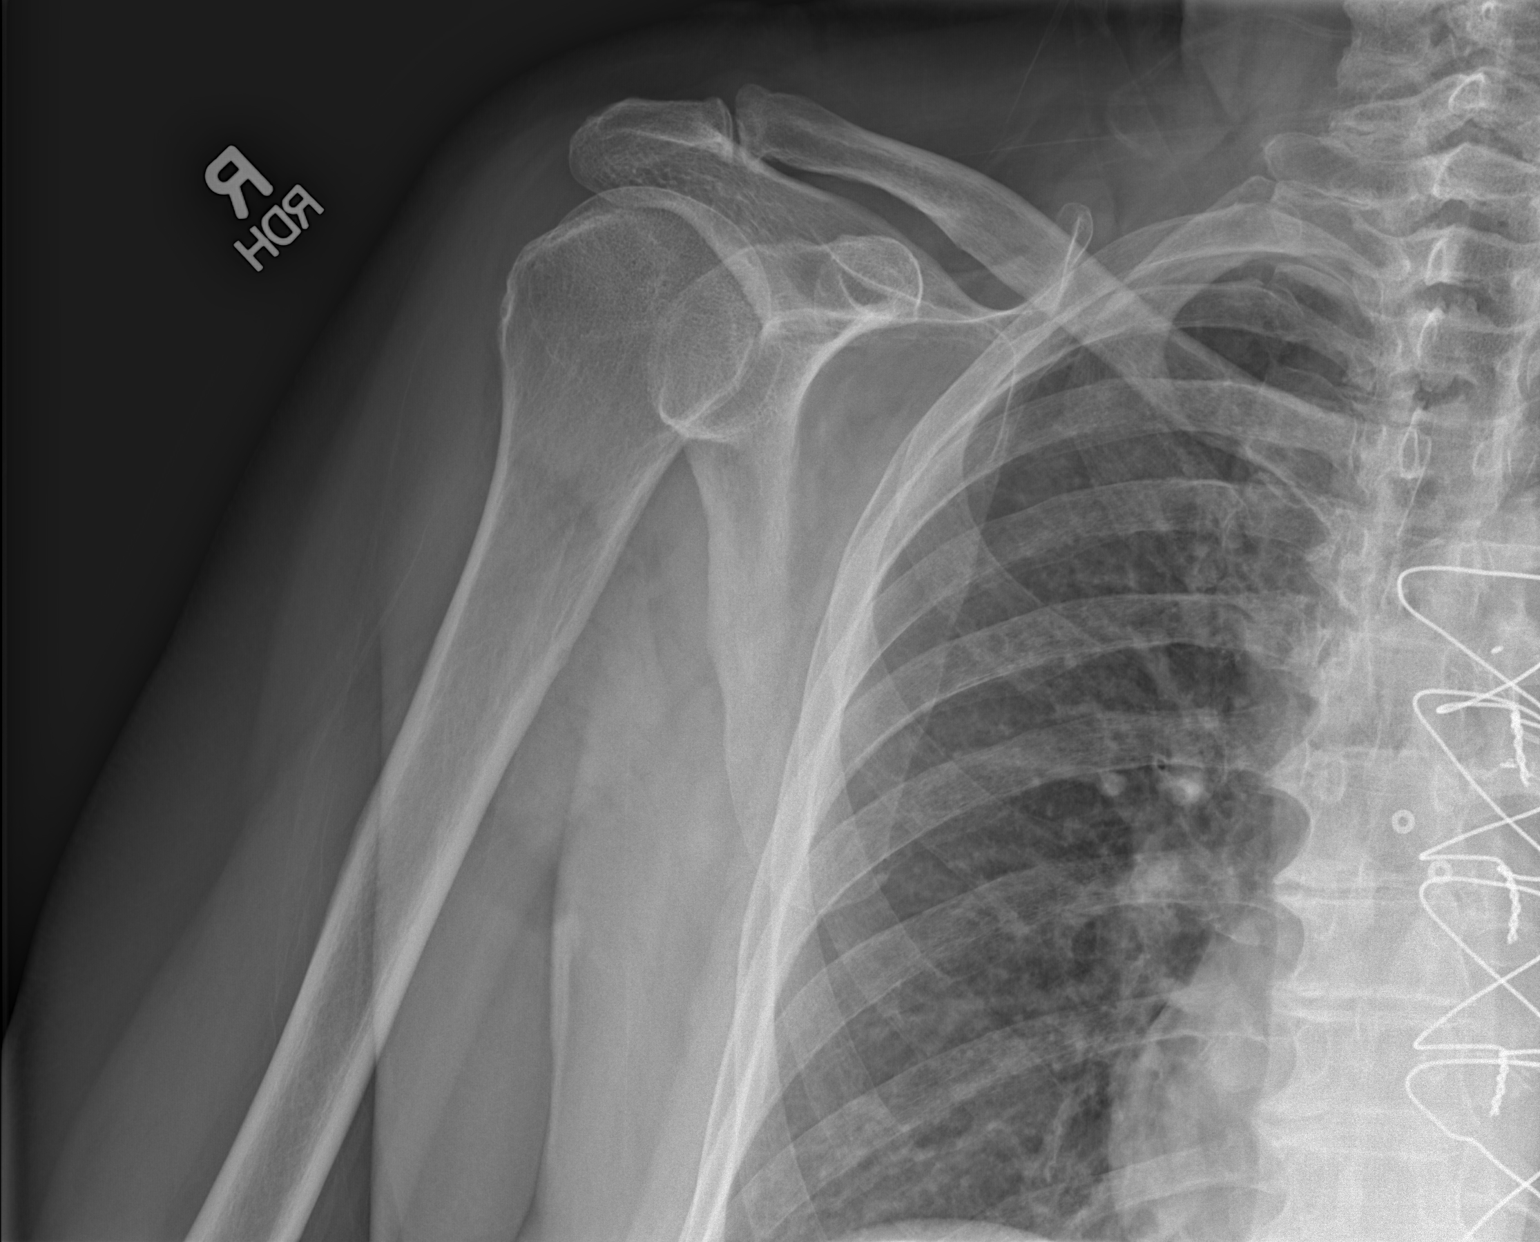
[im 3/3]
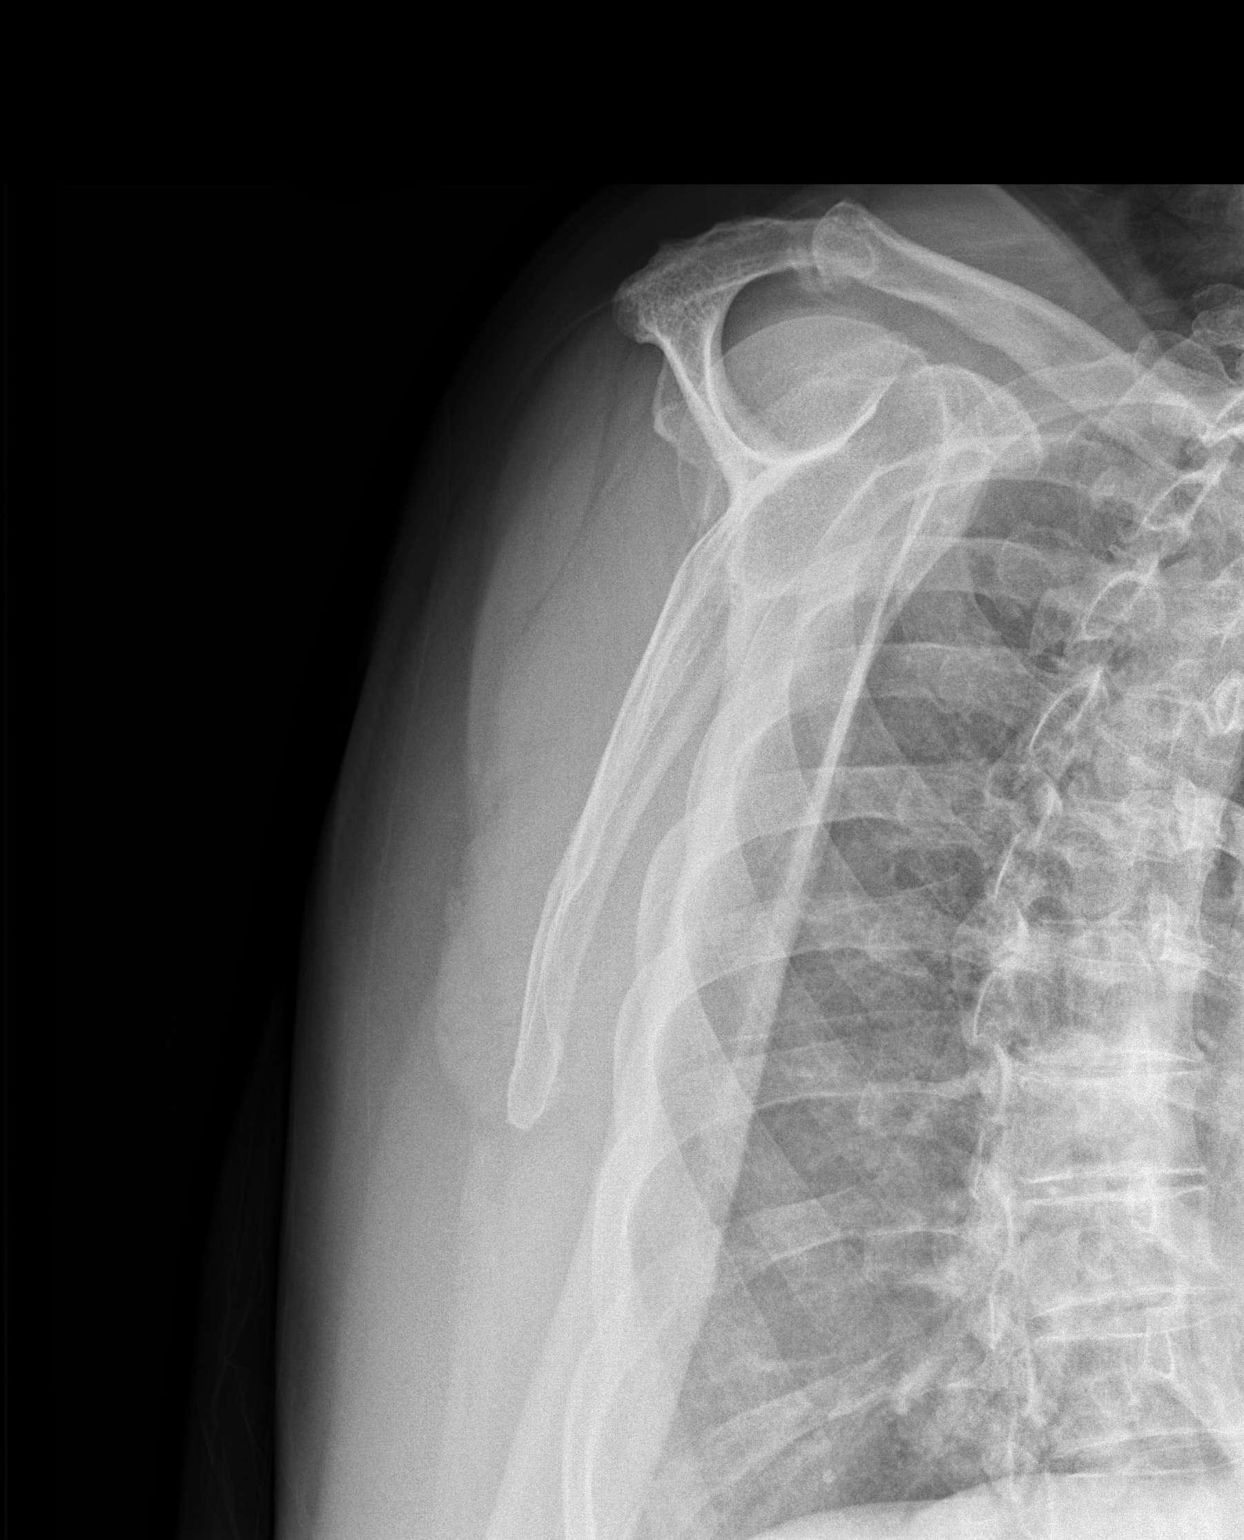

[3 of 3 positions shown; findings below may reference images not displayed]

IMPRESSION: 1. No acute bony abnormality evident.

## 2013-06-02 ENCOUNTER — Emergency Department: Payer: Self-pay | Admitting: Emergency Medicine

## 2013-06-02 LAB — URINALYSIS, COMPLETE
BLOOD: NEGATIVE
Bacteria: NONE SEEN
Bilirubin,UR: NEGATIVE
Glucose,UR: NEGATIVE mg/dL (ref 0–75)
Ketone: NEGATIVE
NITRITE: NEGATIVE
PH: 5 (ref 4.5–8.0)
Protein: NEGATIVE
Specific Gravity: 1.013 (ref 1.003–1.030)
WBC UR: 3 /HPF (ref 0–5)

## 2013-06-19 IMAGING — CR DG CHEST 2V
1 series · 2 of 2 positions shown · non-contrast
Comparison: none

REASON FOR EXAM: chest pain.
COMMENTS:

[Series 1: pa · 0.17mm/px · 2 of 2 slices shown]
[im 1/2]
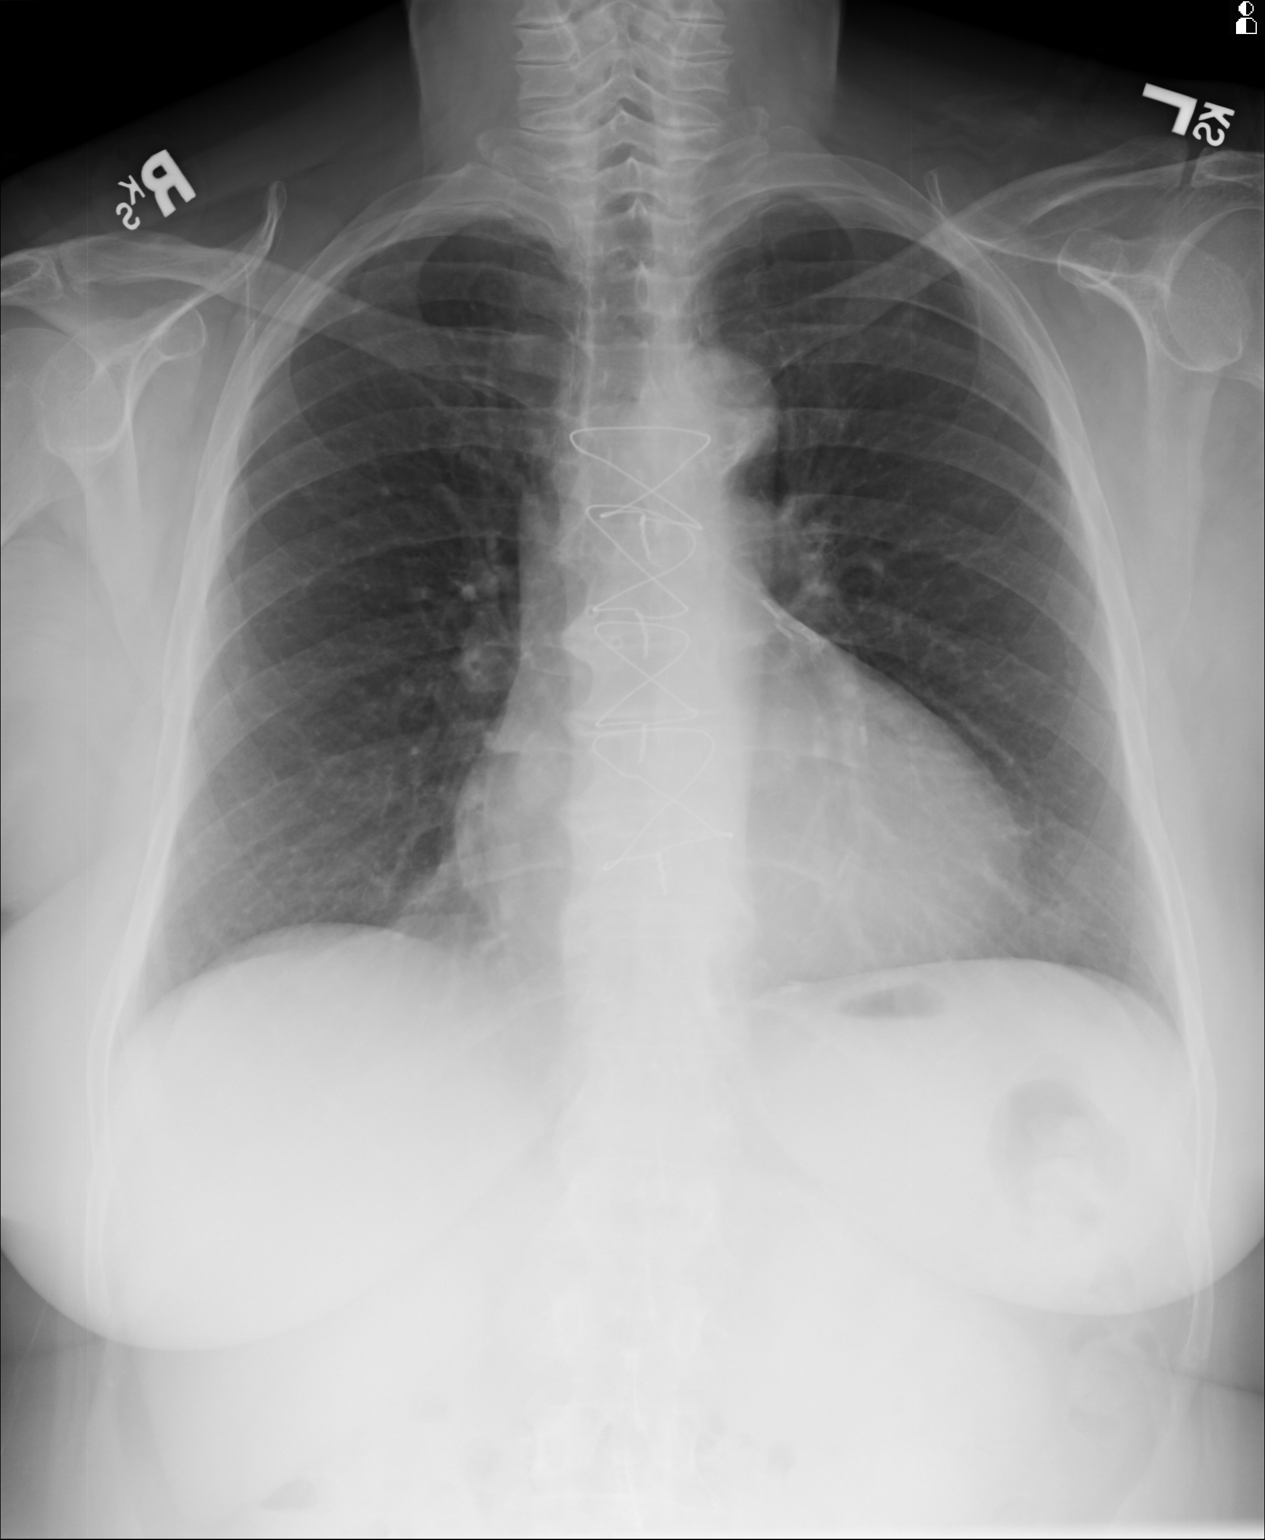
[im 2/2]
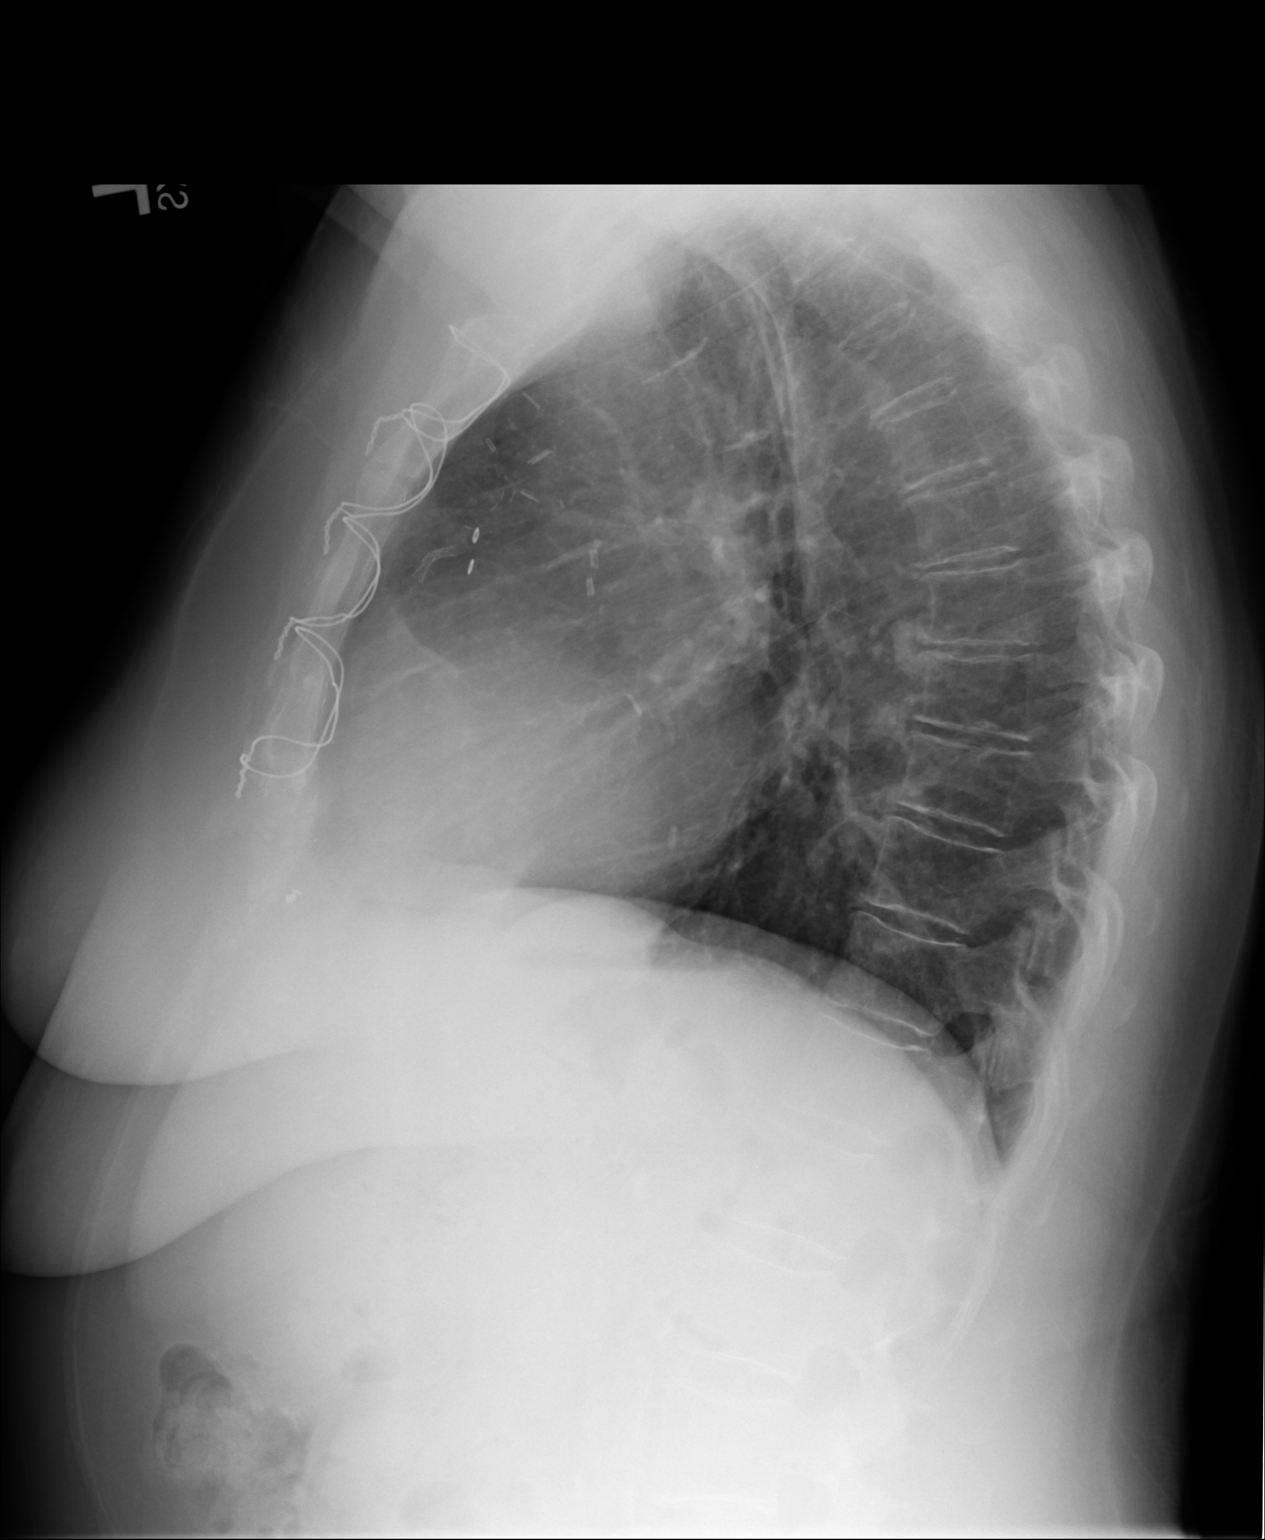

[2 of 2 positions shown; findings below may reference images not displayed]

PROCEDURE:     DXR - DXR CHEST PA (OR AP) AND LATERAL  - April 07, 2011  [DATE]

RESULT:     Comparison is made to prior study dated 11/26/2010.

There is no evidence of focal infiltrates, effusions or edema. The cardiac
silhouette is enlarged indicative of cardiomegaly. The patient is status
post median sternotomy.
IMPRESSION: 1. Chest radiograph without evidence of acute cardiopulmonary disease.

## 2013-06-21 ENCOUNTER — Emergency Department: Payer: Self-pay | Admitting: Emergency Medicine

## 2013-06-22 LAB — BASIC METABOLIC PANEL
ANION GAP: 4 — AB (ref 7–16)
BUN: 20 mg/dL — ABNORMAL HIGH (ref 7–18)
CALCIUM: 9.2 mg/dL (ref 8.5–10.1)
CREATININE: 0.69 mg/dL (ref 0.60–1.30)
Chloride: 108 mmol/L — ABNORMAL HIGH (ref 98–107)
Co2: 30 mmol/L (ref 21–32)
EGFR (African American): >60
EGFR (Non-African Amer.): >60
Glucose: 108 mg/dL — ABNORMAL HIGH (ref 65–99)
OSMOLALITY: 286 (ref 275–301)
POTASSIUM: 4.3 mmol/L (ref 3.5–5.1)
Sodium: 142 mmol/L (ref 136–145)

## 2013-06-22 LAB — URINALYSIS, COMPLETE
Bilirubin,UR: NEGATIVE
Blood: NEGATIVE
Glucose,UR: NEGATIVE mg/dL (ref 0–75)
Ketone: NEGATIVE
Nitrite: NEGATIVE
Ph: 5 (ref 4.5–8.0)
Protein: NEGATIVE
RBC,UR: 4 /HPF (ref 0–5)
SPECIFIC GRAVITY: 1.019 (ref 1.003–1.030)
Squamous Epithelial: 2
WBC UR: 19 /HPF (ref 0–5)

## 2013-06-22 LAB — CBC
HCT: 39.1 % (ref 35.0–47.0)
HGB: 12.5 g/dL (ref 12.0–16.0)
MCH: 26.5 pg (ref 26.0–34.0)
MCHC: 31.9 g/dL — ABNORMAL LOW (ref 32.0–36.0)
MCV: 83 fL (ref 80–100)
Platelet: 250 10*3/uL (ref 150–440)
RBC: 4.71 10*6/uL (ref 3.80–5.20)
RDW: 15.7 % — ABNORMAL HIGH (ref 11.5–14.5)
WBC: 9.6 10*3/uL (ref 3.6–11.0)

## 2013-06-22 LAB — TROPONIN I: Troponin-I: <0.02 ng/mL

## 2013-07-04 ENCOUNTER — Emergency Department: Payer: Self-pay | Admitting: Emergency Medicine

## 2013-07-04 LAB — CBC
HCT: 38.2 % (ref 35.0–47.0)
HGB: 12.5 g/dL (ref 12.0–16.0)
MCH: 27 pg (ref 26.0–34.0)
MCHC: 32.6 g/dL (ref 32.0–36.0)
MCV: 83 fL (ref 80–100)
PLATELETS: 244 10*3/uL (ref 150–440)
RBC: 4.61 10*6/uL (ref 3.80–5.20)
RDW: 15.8 % — ABNORMAL HIGH (ref 11.5–14.5)
WBC: 9.3 10*3/uL (ref 3.6–11.0)

## 2013-07-04 LAB — COMPREHENSIVE METABOLIC PANEL
ALK PHOS: 143 U/L — AB
ALT: 36 U/L (ref 12–78)
AST: 23 U/L (ref 15–37)
Albumin: 3.7 g/dL (ref 3.4–5.0)
Anion Gap: 6 — ABNORMAL LOW (ref 7–16)
BILIRUBIN TOTAL: 0.2 mg/dL (ref 0.2–1.0)
BUN: 22 mg/dL — AB (ref 7–18)
CHLORIDE: 108 mmol/L — AB (ref 98–107)
Calcium, Total: 8.5 mg/dL (ref 8.5–10.1)
Co2: 27 mmol/L (ref 21–32)
Creatinine: 0.69 mg/dL (ref 0.60–1.30)
EGFR (Non-African Amer.): >60
Glucose: 203 mg/dL — ABNORMAL HIGH (ref 65–99)
OSMOLALITY: 290 (ref 275–301)
POTASSIUM: 3.8 mmol/L (ref 3.5–5.1)
Sodium: 141 mmol/L (ref 136–145)
Total Protein: 7.5 g/dL (ref 6.4–8.2)

## 2013-07-04 LAB — URINALYSIS, COMPLETE
BACTERIA: NONE SEEN
BLOOD: NEGATIVE
Bilirubin,UR: NEGATIVE
Glucose,UR: 150 mg/dL (ref 0–75)
Ketone: NEGATIVE
Nitrite: NEGATIVE
Ph: 5 (ref 4.5–8.0)
Protein: NEGATIVE
SPECIFIC GRAVITY: 1.024 (ref 1.003–1.030)
WBC UR: 1 /HPF (ref 0–5)

## 2013-07-05 LAB — TROPONIN I: Troponin-I: <0.02 ng/mL

## 2013-07-16 ENCOUNTER — Emergency Department: Payer: Self-pay | Admitting: Emergency Medicine

## 2013-07-16 LAB — URINALYSIS, COMPLETE
Bacteria: NONE SEEN
Bilirubin,UR: NEGATIVE
Blood: NEGATIVE
Glucose,UR: NEGATIVE mg/dL (ref 0–75)
Ketone: NEGATIVE
Nitrite: NEGATIVE
Ph: 5 (ref 4.5–8.0)
Protein: NEGATIVE
Specific Gravity: 1.024 (ref 1.003–1.030)
WBC UR: 2 /HPF (ref 0–5)

## 2013-07-16 LAB — CBC
HCT: 38.6 % (ref 35.0–47.0)
HGB: 11.9 g/dL — ABNORMAL LOW (ref 12.0–16.0)
MCH: 25.9 pg — ABNORMAL LOW (ref 26.0–34.0)
MCHC: 30.9 g/dL — ABNORMAL LOW (ref 32.0–36.0)
MCV: 84 fL (ref 80–100)
PLATELETS: 244 10*3/uL (ref 150–440)
RBC: 4.6 10*6/uL (ref 3.80–5.20)
RDW: 15.6 % — ABNORMAL HIGH (ref 11.5–14.5)
WBC: 9.1 10*3/uL (ref 3.6–11.0)

## 2013-07-16 LAB — BASIC METABOLIC PANEL
ANION GAP: 6 — AB (ref 7–16)
BUN: 29 mg/dL — ABNORMAL HIGH (ref 7–18)
CHLORIDE: 106 mmol/L (ref 98–107)
Calcium, Total: 8.7 mg/dL (ref 8.5–10.1)
Co2: 26 mmol/L (ref 21–32)
Creatinine: 0.55 mg/dL — ABNORMAL LOW (ref 0.60–1.30)
EGFR (Non-African Amer.): >60
Glucose: 191 mg/dL — ABNORMAL HIGH (ref 65–99)
Osmolality: 287 (ref 275–301)
Potassium: 4.6 mmol/L (ref 3.5–5.1)
Sodium: 138 mmol/L (ref 136–145)

## 2013-07-16 LAB — PROTIME-INR
INR: 1
Prothrombin Time: 12.8 secs (ref 11.5–14.7)

## 2013-07-16 LAB — PRO B NATRIURETIC PEPTIDE: B-Type Natriuretic Peptide: 824 pg/mL — ABNORMAL HIGH (ref 0–125)

## 2013-07-31 ENCOUNTER — Emergency Department: Payer: Self-pay | Admitting: Emergency Medicine

## 2013-07-31 LAB — CK TOTAL AND CKMB (NOT AT ARMC)
CK, TOTAL: 152 U/L
CK-MB: 1.2 ng/mL (ref 0.5–3.6)

## 2013-07-31 LAB — CBC
HCT: 38.1 % (ref 35.0–47.0)
HGB: 12.2 g/dL (ref 12.0–16.0)
MCH: 27 pg (ref 26.0–34.0)
MCHC: 32.1 g/dL (ref 32.0–36.0)
MCV: 84 fL (ref 80–100)
PLATELETS: 251 10*3/uL (ref 150–440)
RBC: 4.52 10*6/uL (ref 3.80–5.20)
RDW: 15 % — ABNORMAL HIGH (ref 11.5–14.5)
WBC: 8.7 10*3/uL (ref 3.6–11.0)

## 2013-07-31 LAB — COMPREHENSIVE METABOLIC PANEL
ALK PHOS: 117 U/L
ANION GAP: 9 (ref 7–16)
AST: 15 U/L (ref 15–37)
Albumin: 3.4 g/dL (ref 3.4–5.0)
BILIRUBIN TOTAL: 0.3 mg/dL (ref 0.2–1.0)
BUN: 24 mg/dL — ABNORMAL HIGH (ref 7–18)
Calcium, Total: 8.8 mg/dL (ref 8.5–10.1)
Chloride: 107 mmol/L (ref 98–107)
Co2: 26 mmol/L (ref 21–32)
Creatinine: 0.51 mg/dL — ABNORMAL LOW (ref 0.60–1.30)
EGFR (African American): >60
EGFR (Non-African Amer.): >60
GLUCOSE: 101 mg/dL — AB (ref 65–99)
OSMOLALITY: 287 (ref 275–301)
POTASSIUM: 3.9 mmol/L (ref 3.5–5.1)
SGPT (ALT): 21 U/L (ref 12–78)
SODIUM: 142 mmol/L (ref 136–145)
TOTAL PROTEIN: 7.2 g/dL (ref 6.4–8.2)

## 2013-07-31 LAB — PRO B NATRIURETIC PEPTIDE: B-Type Natriuretic Peptide: 1066 pg/mL — ABNORMAL HIGH (ref 0–125)

## 2013-07-31 LAB — TROPONIN I: Troponin-I: <0.02 ng/mL

## 2013-07-31 IMAGING — CR DG CHEST 2V
1 series · 2 of 2 positions shown · non-contrast
Comparison: none

REASON FOR EXAM: CP
COMMENTS:   May transport without cardiac monitor

[Series 1: w chest pa · 0.14mm/px · 2 of 2 slices shown]
[im 1/2]
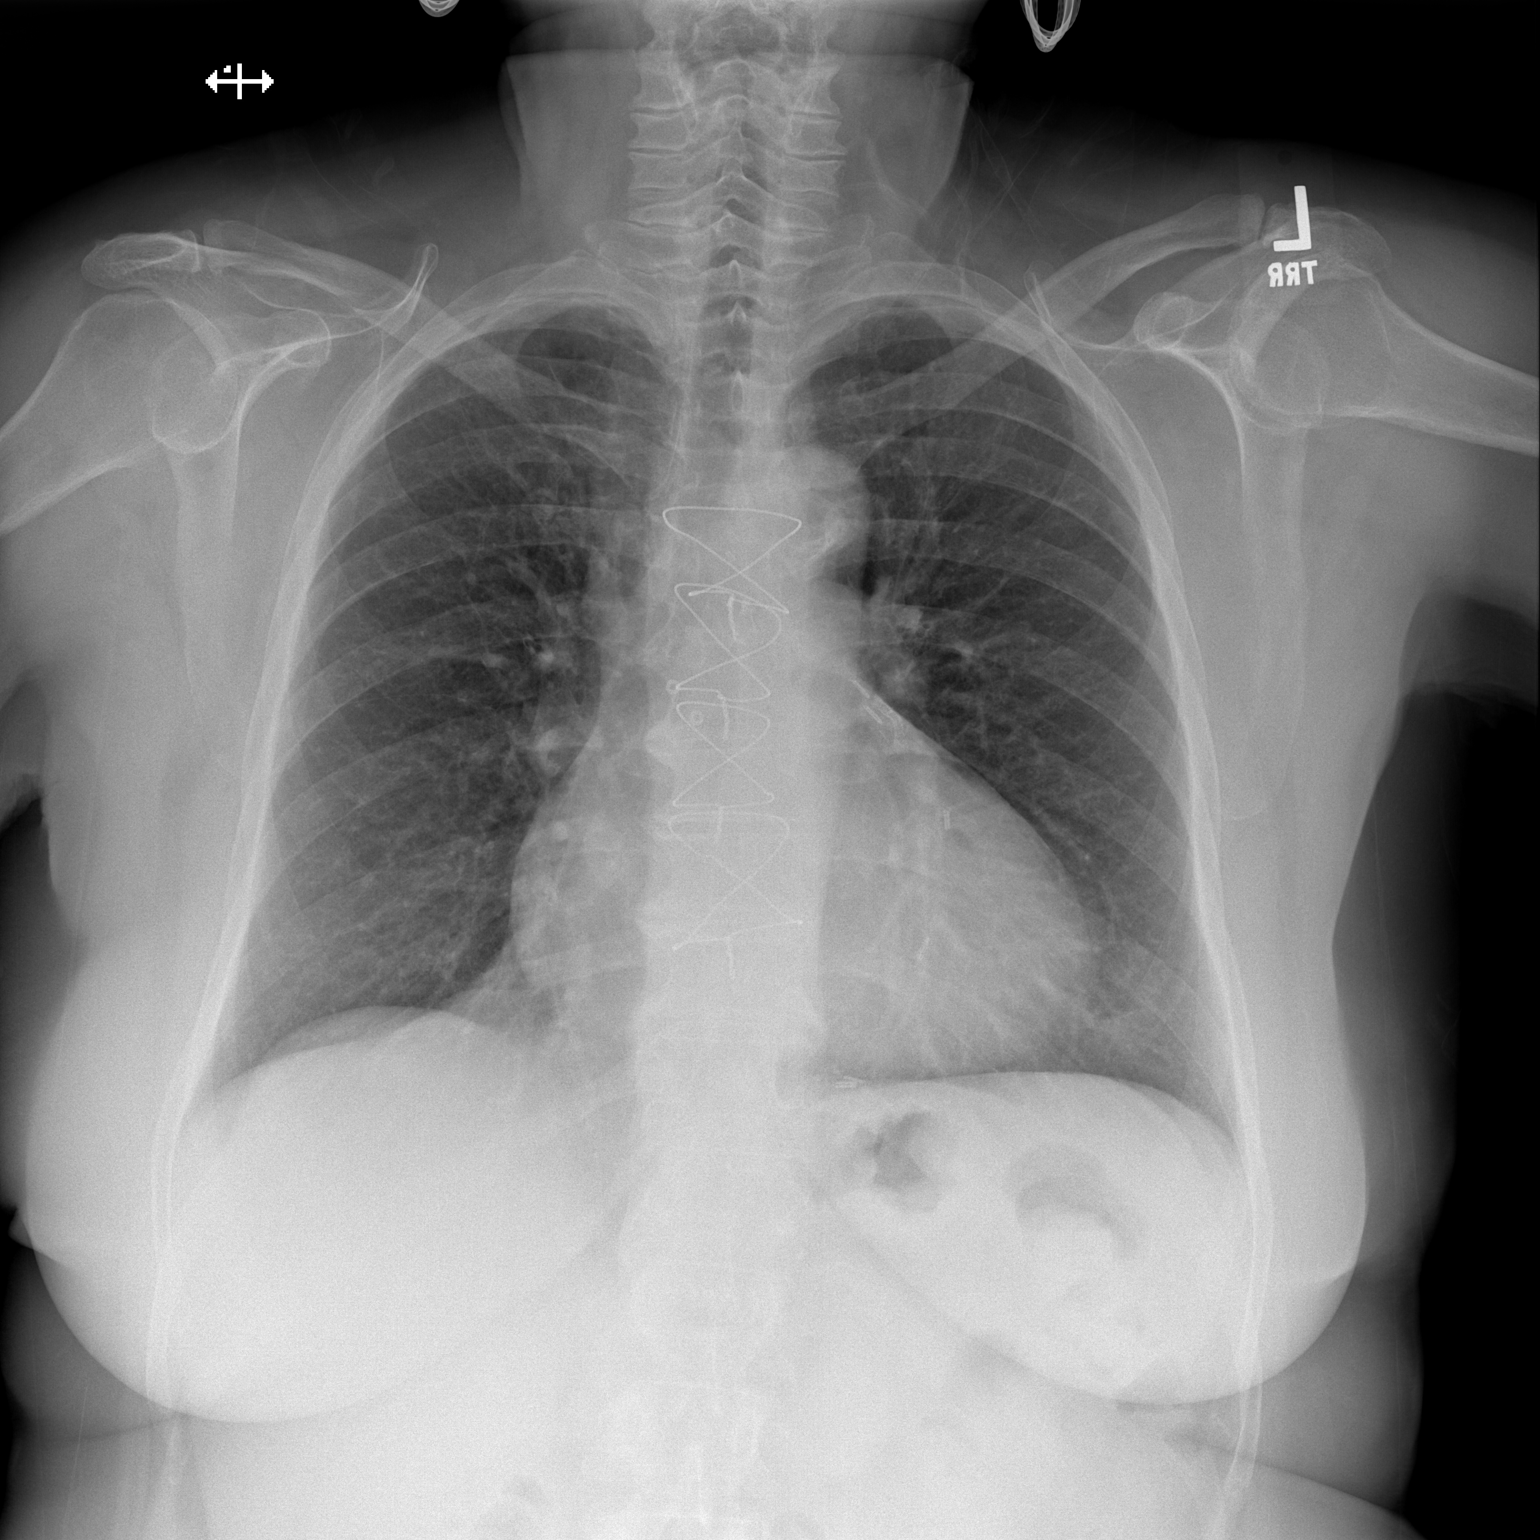
[im 2/2]
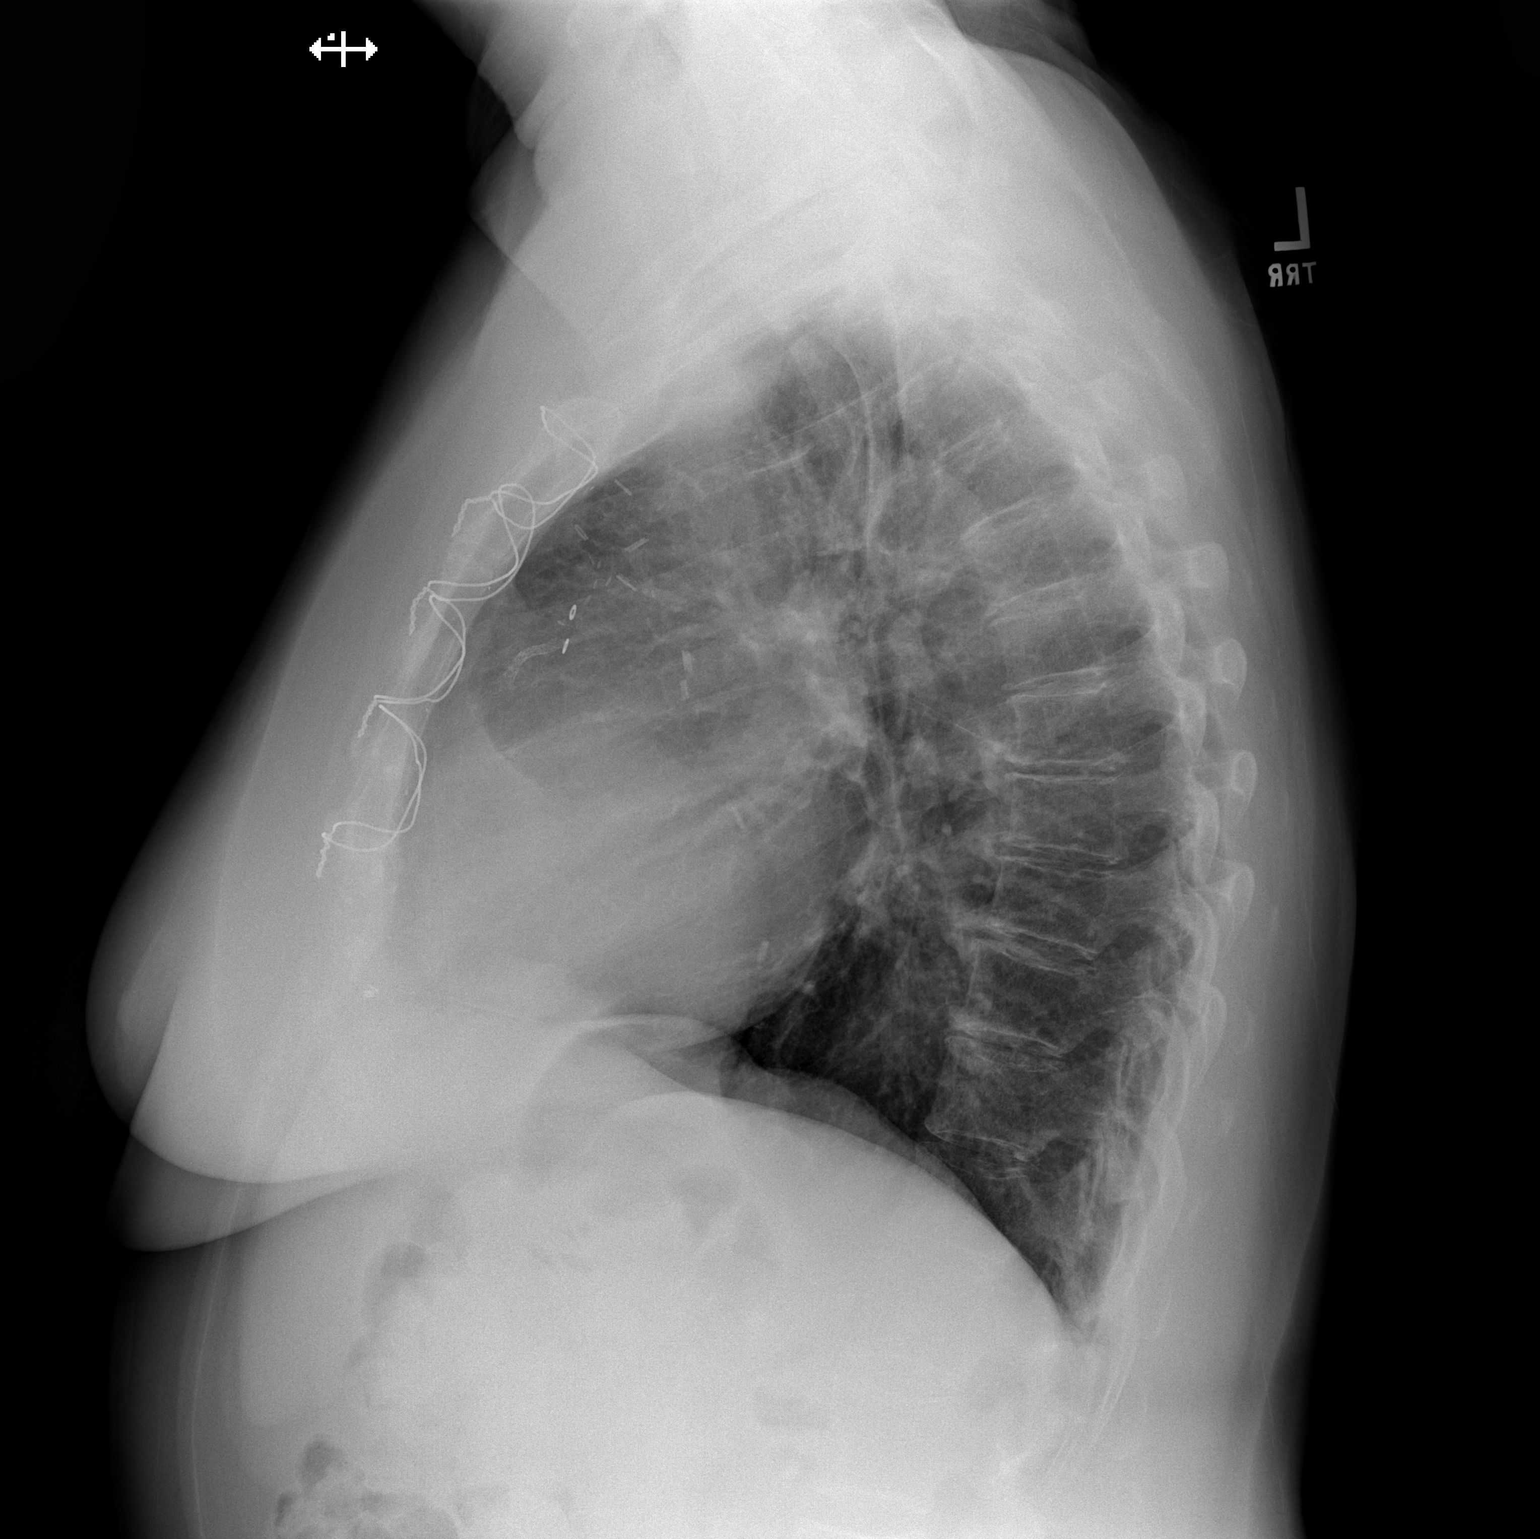

[2 of 2 positions shown; findings below may reference images not displayed]

PROCEDURE:     DXR - DXR CHEST PA (OR AP) AND LATERAL  - May 19, 2011 [DATE]

RESULT:     Comparison is made to prior study dated 04/07/2011.

The patient has taken a shallow inspiration. There is no evidence of focal
infiltrates, effusions or edema. The cardiac silhouette is moderately
enlarged. The patient is status post median sternotomy and coronary artery
bypass grafting. The visualized bony skeleton is unremarkable.
IMPRESSION: 1. Chest radiograph without evidence of acute cardiopulmonary disease.

## 2013-08-28 ENCOUNTER — Emergency Department: Payer: Self-pay | Admitting: Emergency Medicine

## 2013-08-28 LAB — COMPREHENSIVE METABOLIC PANEL
Albumin: 3.4 g/dL (ref 3.4–5.0)
Alkaline Phosphatase: 136 U/L — ABNORMAL HIGH
Anion Gap: 5 — ABNORMAL LOW (ref 7–16)
BUN: 22 mg/dL — ABNORMAL HIGH (ref 7–18)
Bilirubin,Total: 0.2 mg/dL (ref 0.2–1.0)
CALCIUM: 8.9 mg/dL (ref 8.5–10.1)
Chloride: 107 mmol/L (ref 98–107)
Co2: 27 mmol/L (ref 21–32)
Creatinine: 0.66 mg/dL (ref 0.60–1.30)
EGFR (Non-African Amer.): >60
Glucose: 144 mg/dL — ABNORMAL HIGH (ref 65–99)
Osmolality: 283 (ref 275–301)
Potassium: 4.1 mmol/L (ref 3.5–5.1)
SGOT(AST): 14 U/L — ABNORMAL LOW (ref 15–37)
SGPT (ALT): 26 U/L (ref 12–78)
Sodium: 139 mmol/L (ref 136–145)
TOTAL PROTEIN: 7.1 g/dL (ref 6.4–8.2)

## 2013-08-28 LAB — CBC
HCT: 38 % (ref 35.0–47.0)
HGB: 12 g/dL (ref 12.0–16.0)
MCH: 26.7 pg (ref 26.0–34.0)
MCHC: 31.5 g/dL — ABNORMAL LOW (ref 32.0–36.0)
MCV: 85 fL (ref 80–100)
Platelet: 260 10*3/uL (ref 150–440)
RBC: 4.49 10*6/uL (ref 3.80–5.20)
RDW: 14.5 % (ref 11.5–14.5)
WBC: 9.9 10*3/uL (ref 3.6–11.0)

## 2013-08-28 LAB — CK TOTAL AND CKMB (NOT AT ARMC)
CK, TOTAL: 88 U/L
CK-MB: 0.9 ng/mL (ref 0.5–3.6)

## 2013-08-28 LAB — TROPONIN I: Troponin-I: <0.02 ng/mL

## 2013-09-27 ENCOUNTER — Emergency Department: Payer: Self-pay | Admitting: Emergency Medicine

## 2013-09-27 LAB — URINALYSIS, COMPLETE
BACTERIA: NONE SEEN
BILIRUBIN, UR: NEGATIVE
Blood: NEGATIVE
GLUCOSE, UR: NEGATIVE mg/dL (ref 0–75)
KETONE: NEGATIVE
Leukocyte Esterase: NEGATIVE
Nitrite: NEGATIVE
PROTEIN: NEGATIVE
Ph: 5 (ref 4.5–8.0)
Specific Gravity: 1.021 (ref 1.003–1.030)
WBC UR: <1 /HPF (ref 0–5)

## 2013-09-27 LAB — CBC
HCT: 36.6 % (ref 35.0–47.0)
HGB: 11.5 g/dL — ABNORMAL LOW (ref 12.0–16.0)
MCH: 26.7 pg (ref 26.0–34.0)
MCHC: 31.5 g/dL — ABNORMAL LOW (ref 32.0–36.0)
MCV: 85 fL (ref 80–100)
Platelet: 243 10*3/uL (ref 150–440)
RBC: 4.31 10*6/uL (ref 3.80–5.20)
RDW: 14.9 % — AB (ref 11.5–14.5)
WBC: 10.1 10*3/uL (ref 3.6–11.0)

## 2013-09-27 LAB — BASIC METABOLIC PANEL
Anion Gap: 6 — ABNORMAL LOW (ref 7–16)
BUN: 17 mg/dL (ref 7–18)
CALCIUM: 8.6 mg/dL (ref 8.5–10.1)
CHLORIDE: 108 mmol/L — AB (ref 98–107)
CREATININE: 0.64 mg/dL (ref 0.60–1.30)
Co2: 28 mmol/L (ref 21–32)
EGFR (African American): >60
EGFR (Non-African Amer.): >60
GLUCOSE: 120 mg/dL — AB (ref 65–99)
Osmolality: 286 (ref 275–301)
POTASSIUM: 4 mmol/L (ref 3.5–5.1)
SODIUM: 142 mmol/L (ref 136–145)

## 2013-09-27 LAB — TROPONIN I: Troponin-I: <0.02 ng/mL

## 2013-10-23 ENCOUNTER — Emergency Department: Payer: Self-pay | Admitting: Emergency Medicine

## 2013-10-23 LAB — COMPREHENSIVE METABOLIC PANEL
ANION GAP: 10 (ref 7–16)
Albumin: 3.5 g/dL (ref 3.4–5.0)
Alkaline Phosphatase: 100 U/L
BUN: 19 mg/dL — AB (ref 7–18)
Bilirubin,Total: 0.3 mg/dL (ref 0.2–1.0)
CALCIUM: 8.6 mg/dL (ref 8.5–10.1)
CO2: 27 mmol/L (ref 21–32)
CREATININE: 0.82 mg/dL (ref 0.60–1.30)
Chloride: 107 mmol/L (ref 98–107)
EGFR (African American): >60
EGFR (Non-African Amer.): >60
Glucose: 107 mg/dL — ABNORMAL HIGH (ref 65–99)
Osmolality: 290 (ref 275–301)
Potassium: 3.8 mmol/L (ref 3.5–5.1)
SGOT(AST): 21 U/L (ref 15–37)
SGPT (ALT): 24 U/L
Sodium: 144 mmol/L (ref 136–145)
Total Protein: 7.2 g/dL (ref 6.4–8.2)

## 2013-10-23 LAB — URINALYSIS, COMPLETE
BLOOD: NEGATIVE
Bacteria: NONE SEEN
Bilirubin,UR: NEGATIVE
GLUCOSE, UR: NEGATIVE mg/dL (ref 0–75)
KETONE: NEGATIVE
Leukocyte Esterase: NEGATIVE
Nitrite: NEGATIVE
PROTEIN: NEGATIVE
Ph: 5 (ref 4.5–8.0)
RBC,UR: 1 /HPF (ref 0–5)
Specific Gravity: 1.021 (ref 1.003–1.030)
Squamous Epithelial: 2
WBC UR: 3 /HPF (ref 0–5)

## 2013-10-23 LAB — CBC WITH DIFFERENTIAL/PLATELET
Basophil #: 0.1 10*3/uL (ref 0.0–0.1)
Basophil %: 1.2 %
EOS PCT: 1 %
Eosinophil #: 0.1 10*3/uL (ref 0.0–0.7)
HCT: 36.9 % (ref 35.0–47.0)
HGB: 12 g/dL (ref 12.0–16.0)
Lymphocyte #: 2.7 10*3/uL (ref 1.0–3.6)
Lymphocyte %: 25.9 %
MCH: 27.5 pg (ref 26.0–34.0)
MCHC: 32.5 g/dL (ref 32.0–36.0)
MCV: 85 fL (ref 80–100)
MONO ABS: 0.7 x10 3/mm (ref 0.2–0.9)
Monocyte %: 6.4 %
Neutrophil #: 6.7 10*3/uL — ABNORMAL HIGH (ref 1.4–6.5)
Neutrophil %: 65.5 %
Platelet: 225 10*3/uL (ref 150–440)
RBC: 4.36 10*6/uL (ref 3.80–5.20)
RDW: 14.9 % — AB (ref 11.5–14.5)
WBC: 10.3 10*3/uL (ref 3.6–11.0)

## 2013-10-23 LAB — PROTIME-INR
INR: 1
Prothrombin Time: 13.1 secs (ref 11.5–14.7)

## 2013-10-23 LAB — LIPASE, BLOOD: Lipase: 85 U/L (ref 73–393)

## 2013-11-02 ENCOUNTER — Emergency Department: Payer: Self-pay | Admitting: Student

## 2013-11-02 LAB — CBC
HCT: 38.1 % (ref 35.0–47.0)
HGB: 12.5 g/dL (ref 12.0–16.0)
MCH: 27.6 pg (ref 26.0–34.0)
MCHC: 32.8 g/dL (ref 32.0–36.0)
MCV: 84 fL (ref 80–100)
Platelet: 293 10*3/uL (ref 150–440)
RBC: 4.52 10*6/uL (ref 3.80–5.20)
RDW: 14.9 % — ABNORMAL HIGH (ref 11.5–14.5)
WBC: 8.5 10*3/uL (ref 3.6–11.0)

## 2013-11-02 LAB — COMPREHENSIVE METABOLIC PANEL
ALT: 26 U/L
ANION GAP: 7 (ref 7–16)
Albumin: 3.9 g/dL (ref 3.4–5.0)
Alkaline Phosphatase: 143 U/L — ABNORMAL HIGH
BILIRUBIN TOTAL: 0.2 mg/dL (ref 0.2–1.0)
BUN: 20 mg/dL — AB (ref 7–18)
CHLORIDE: 106 mmol/L (ref 98–107)
Calcium, Total: 9.5 mg/dL (ref 8.5–10.1)
Co2: 30 mmol/L (ref 21–32)
Creatinine: 0.67 mg/dL (ref 0.60–1.30)
EGFR (African American): >60
Glucose: 164 mg/dL — ABNORMAL HIGH (ref 65–99)
Osmolality: 291 (ref 275–301)
POTASSIUM: 4.2 mmol/L (ref 3.5–5.1)
SGOT(AST): 13 U/L — ABNORMAL LOW (ref 15–37)
Sodium: 143 mmol/L (ref 136–145)
Total Protein: 7.7 g/dL (ref 6.4–8.2)

## 2013-11-02 LAB — URINALYSIS, COMPLETE
BLOOD: NEGATIVE
Bilirubin,UR: NEGATIVE
Glucose,UR: NEGATIVE mg/dL (ref 0–75)
KETONE: NEGATIVE
LEUKOCYTE ESTERASE: NEGATIVE
Nitrite: NEGATIVE
PH: 5 (ref 4.5–8.0)
Protein: 30
RBC,UR: 1 /HPF (ref 0–5)
Specific Gravity: 1.017 (ref 1.003–1.030)
Squamous Epithelial: 8
WBC UR: 3 /HPF (ref 0–5)

## 2013-11-02 LAB — TROPONIN I

## 2013-11-10 ENCOUNTER — Ambulatory Visit: Payer: Self-pay | Admitting: Cardiology

## 2013-11-22 IMAGING — CR DG CHEST 1V PORT
1 series · 1 of 1 positions shown · non-contrast
Comparison: none

REASON FOR EXAM: CP
COMMENTS:

[ap]
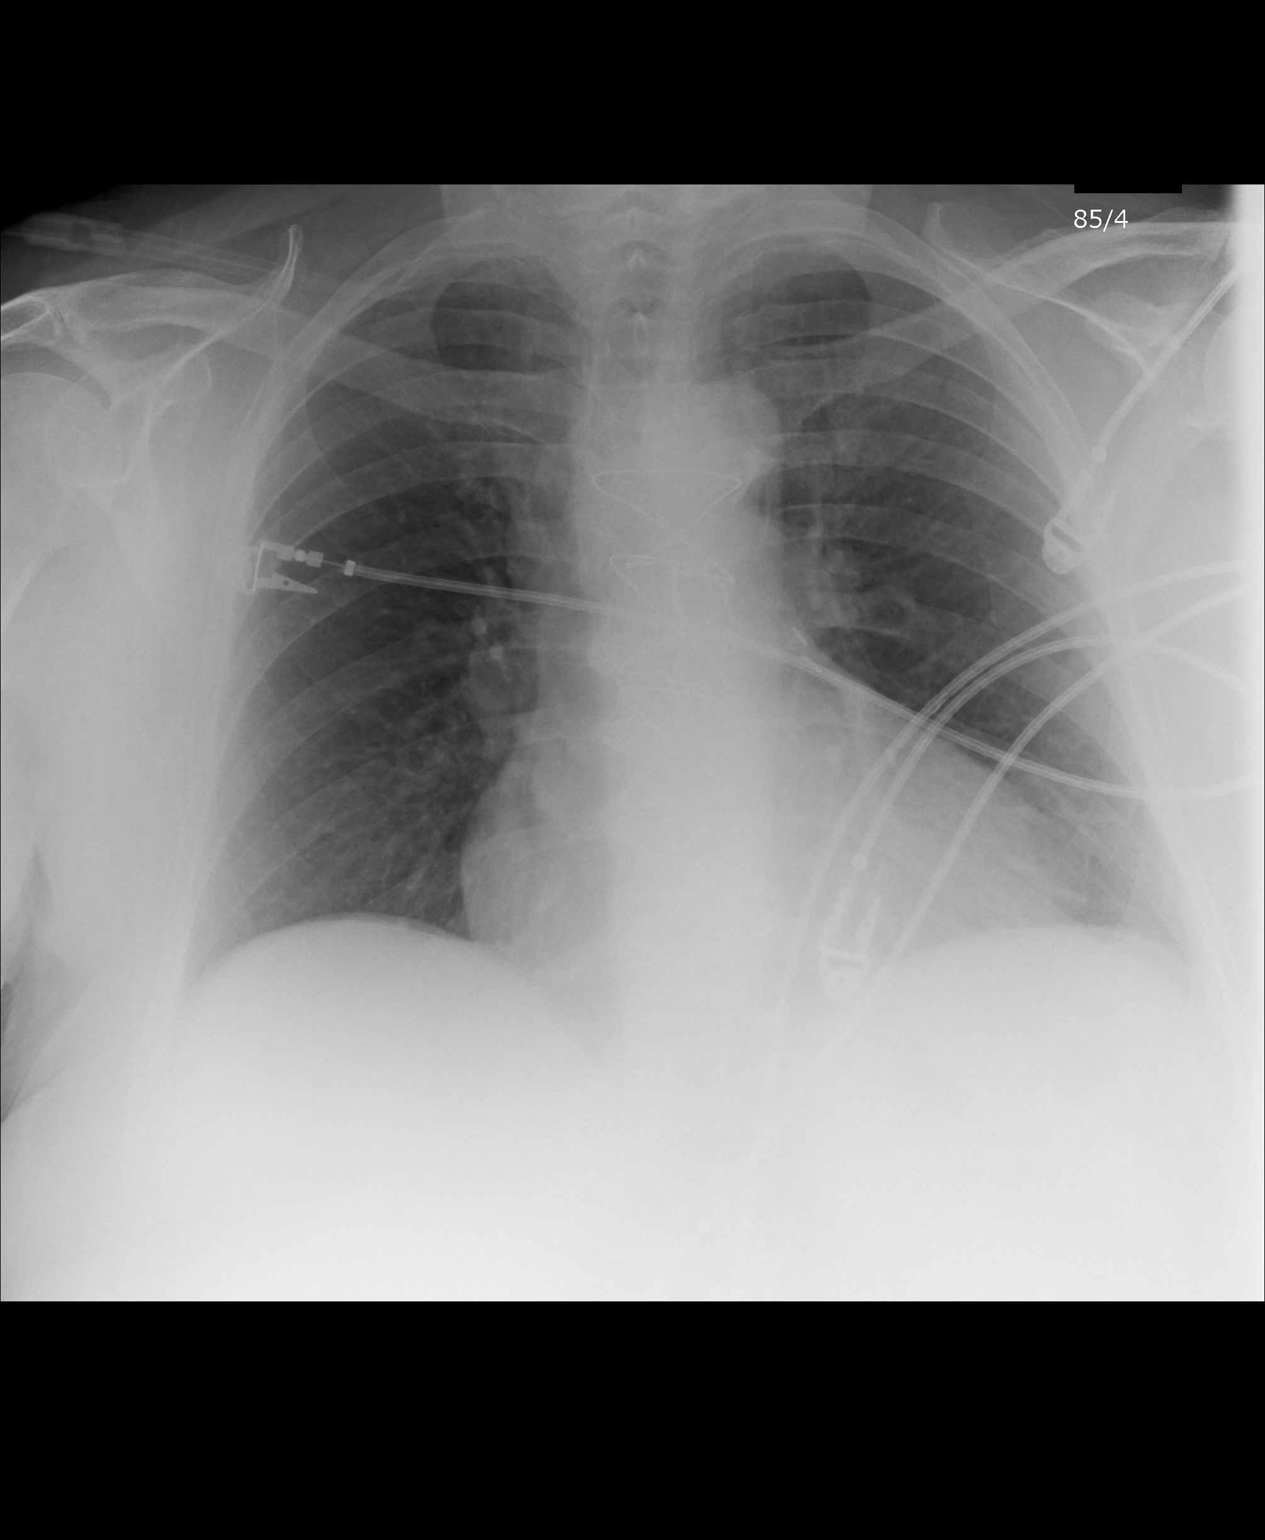

[1 of 1 positions shown; findings below may reference images not displayed]

PROCEDURE:     DXR - DXR PORTABLE CHEST SINGLE VIEW  - September 10, 2011  [DATE]

RESULT:     Comparison is made to the study 19 May, 2011.

The lungs are adequately inflated and clear. The cardiac silhouette is top
normal in size. The pulmonary vascularity is not engorged. The mediastinum
is normal in width. The patient has undergone previous median sternotomy.
IMPRESSION: I do not see evidence of acute cardiopulmonary abnormality.

## 2013-12-19 IMAGING — CR DG CHEST 1V PORT
1 series · 1 of 1 positions shown · non-contrast
Comparison: none

REASON FOR EXAM: cp
COMMENTS:

[portable]
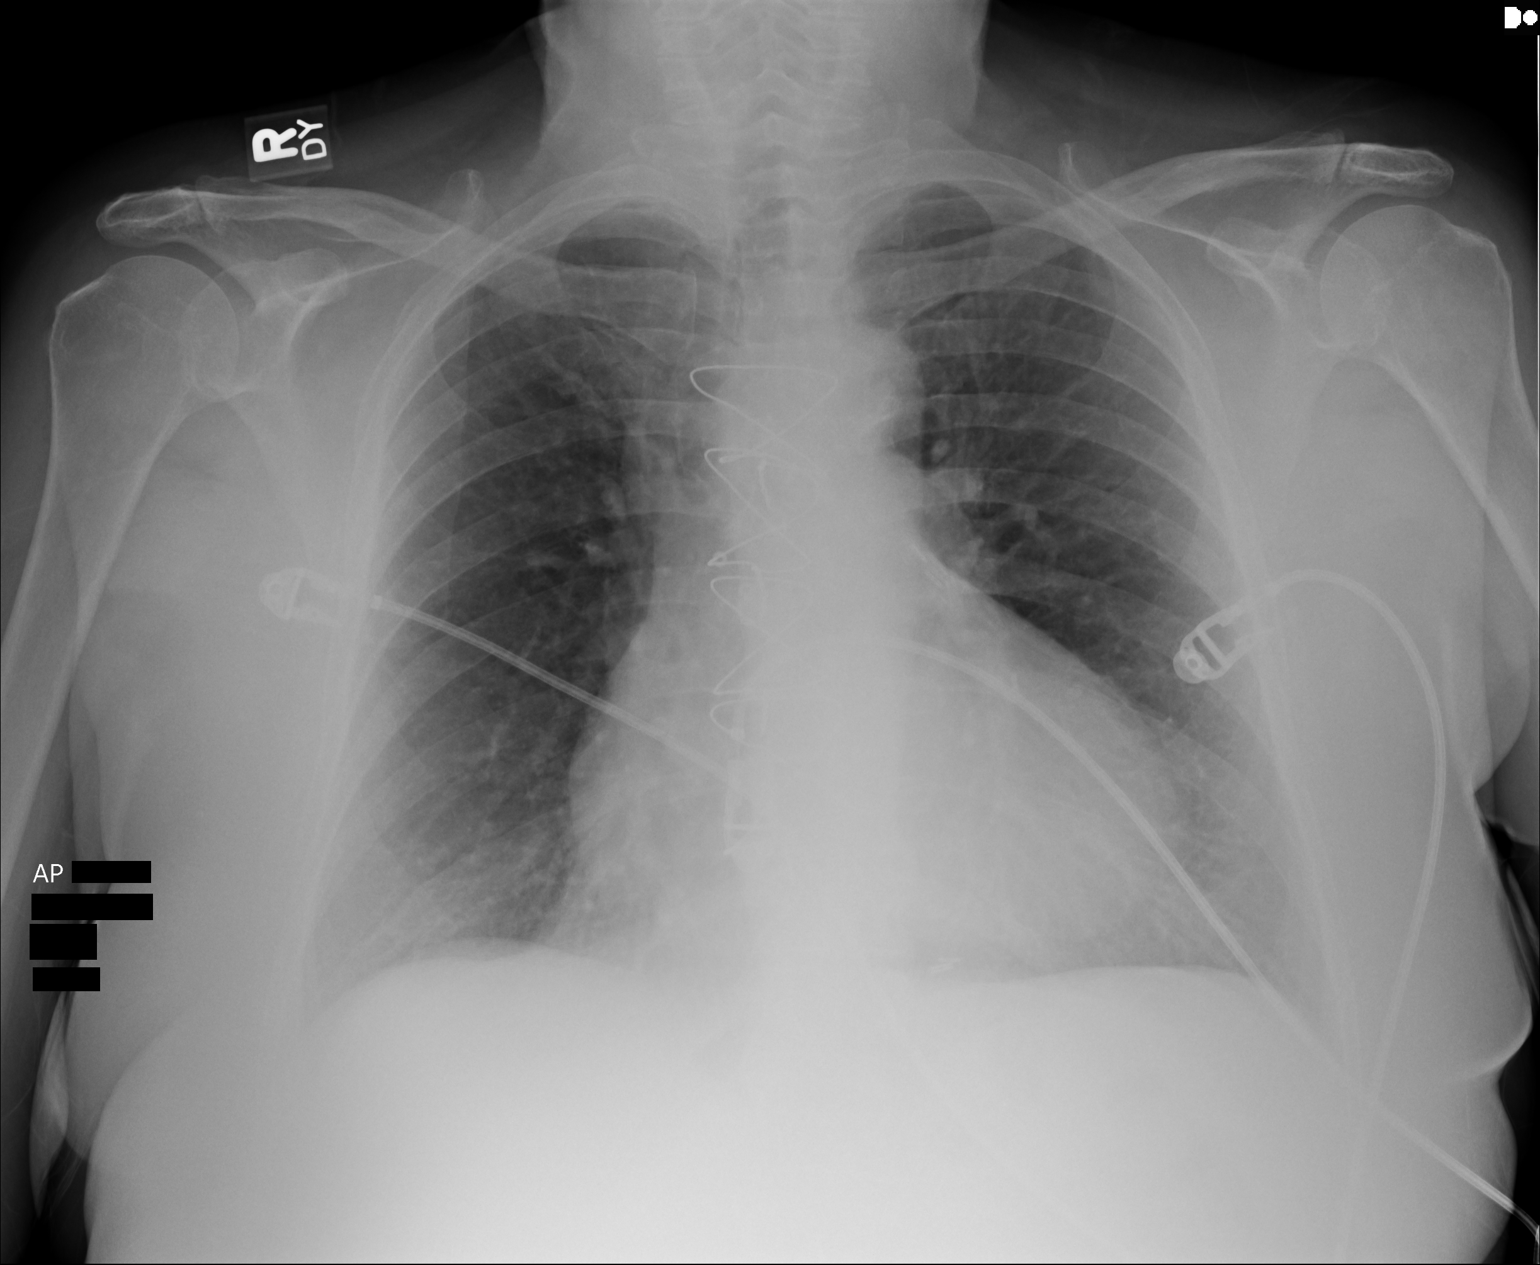

[1 of 1 positions shown; findings below may reference images not displayed]

PROCEDURE:     DXR - DXR PORTABLE CHEST SINGLE VIEW  - October 07, 2011 [DATE]

RESULT:     Comparison is made to a study September 10, 2011.

The lungs are mildly hyperinflated and exhibit minimally increased
interstitial markings. The cardiac silhouette is enlarged. The pulmonary
vascularity is not engorged. The patient is undergone previous median
sternotomy.
IMPRESSION: There is mild stable enlargement of the cardiac silhouette.
There is a slight overall increase in the interstitial markings today as
compared to the previous study which may reflect very mild interstitial
edema.

[REDACTED]

## 2013-12-24 ENCOUNTER — Emergency Department: Payer: Self-pay | Admitting: Emergency Medicine

## 2013-12-24 LAB — BASIC METABOLIC PANEL
ANION GAP: 10 (ref 7–16)
BUN: 21 mg/dL — ABNORMAL HIGH (ref 7–18)
CO2: 24 mmol/L (ref 21–32)
Calcium, Total: 8.2 mg/dL — ABNORMAL LOW (ref 8.5–10.1)
Chloride: 110 mmol/L — ABNORMAL HIGH (ref 98–107)
Creatinine: 0.71 mg/dL (ref 0.60–1.30)
EGFR (Non-African Amer.): >60
Glucose: 186 mg/dL — ABNORMAL HIGH (ref 65–99)
OSMOLALITY: 295 (ref 275–301)
Potassium: 3.9 mmol/L (ref 3.5–5.1)
Sodium: 144 mmol/L (ref 136–145)

## 2013-12-24 LAB — URINALYSIS, COMPLETE
BILIRUBIN, UR: NEGATIVE
Bacteria: NONE SEEN
GLUCOSE, UR: NEGATIVE mg/dL (ref 0–75)
Ketone: NEGATIVE
LEUKOCYTE ESTERASE: NEGATIVE
Nitrite: NEGATIVE
PH: 5 (ref 4.5–8.0)
Protein: NEGATIVE
RBC,UR: <1 /HPF (ref 0–5)
SPECIFIC GRAVITY: 1.023 (ref 1.003–1.030)
WBC UR: 1 /HPF (ref 0–5)

## 2013-12-24 LAB — TROPONIN I

## 2013-12-24 LAB — CBC
HCT: 37.8 % (ref 35.0–47.0)
HGB: 11.8 g/dL — ABNORMAL LOW (ref 12.0–16.0)
MCH: 26.6 pg (ref 26.0–34.0)
MCHC: 31.1 g/dL — ABNORMAL LOW (ref 32.0–36.0)
MCV: 85 fL (ref 80–100)
Platelet: 254 10*3/uL (ref 150–440)
RBC: 4.42 10*6/uL (ref 3.80–5.20)
RDW: 14.6 % — ABNORMAL HIGH (ref 11.5–14.5)
WBC: 9.5 10*3/uL (ref 3.6–11.0)

## 2013-12-29 ENCOUNTER — Emergency Department: Payer: Self-pay | Admitting: Emergency Medicine

## 2013-12-29 LAB — CBC
HCT: 38.4 % (ref 35.0–47.0)
HGB: 12.2 g/dL (ref 12.0–16.0)
MCH: 27.2 pg (ref 26.0–34.0)
MCHC: 31.7 g/dL — ABNORMAL LOW (ref 32.0–36.0)
MCV: 86 fL (ref 80–100)
PLATELETS: 245 10*3/uL (ref 150–440)
RBC: 4.48 10*6/uL (ref 3.80–5.20)
RDW: 14.6 % — ABNORMAL HIGH (ref 11.5–14.5)
WBC: 10.3 10*3/uL (ref 3.6–11.0)

## 2013-12-29 LAB — TROPONIN I: Troponin-I: <0.02 ng/mL

## 2013-12-29 LAB — COMPREHENSIVE METABOLIC PANEL
ANION GAP: 9 (ref 7–16)
Albumin: 3.4 g/dL (ref 3.4–5.0)
Alkaline Phosphatase: 122 U/L — ABNORMAL HIGH
BUN: 21 mg/dL — ABNORMAL HIGH (ref 7–18)
Bilirubin,Total: 0.2 mg/dL (ref 0.2–1.0)
CHLORIDE: 109 mmol/L — AB (ref 98–107)
Calcium, Total: 8.3 mg/dL — ABNORMAL LOW (ref 8.5–10.1)
Co2: 27 mmol/L (ref 21–32)
Creatinine: 0.66 mg/dL (ref 0.60–1.30)
EGFR (African American): >60
EGFR (Non-African Amer.): >60
GLUCOSE: 190 mg/dL — AB (ref 65–99)
Osmolality: 297 (ref 275–301)
Potassium: 4 mmol/L (ref 3.5–5.1)
SGOT(AST): 24 U/L (ref 15–37)
SGPT (ALT): 28 U/L
Sodium: 145 mmol/L (ref 136–145)
TOTAL PROTEIN: 6.9 g/dL (ref 6.4–8.2)

## 2013-12-29 IMAGING — CR DG CHEST 1V PORT
1 series · 1 of 1 positions shown · non-contrast
Comparison: none

REASON FOR EXAM: chest pain
COMMENTS:

[portable]
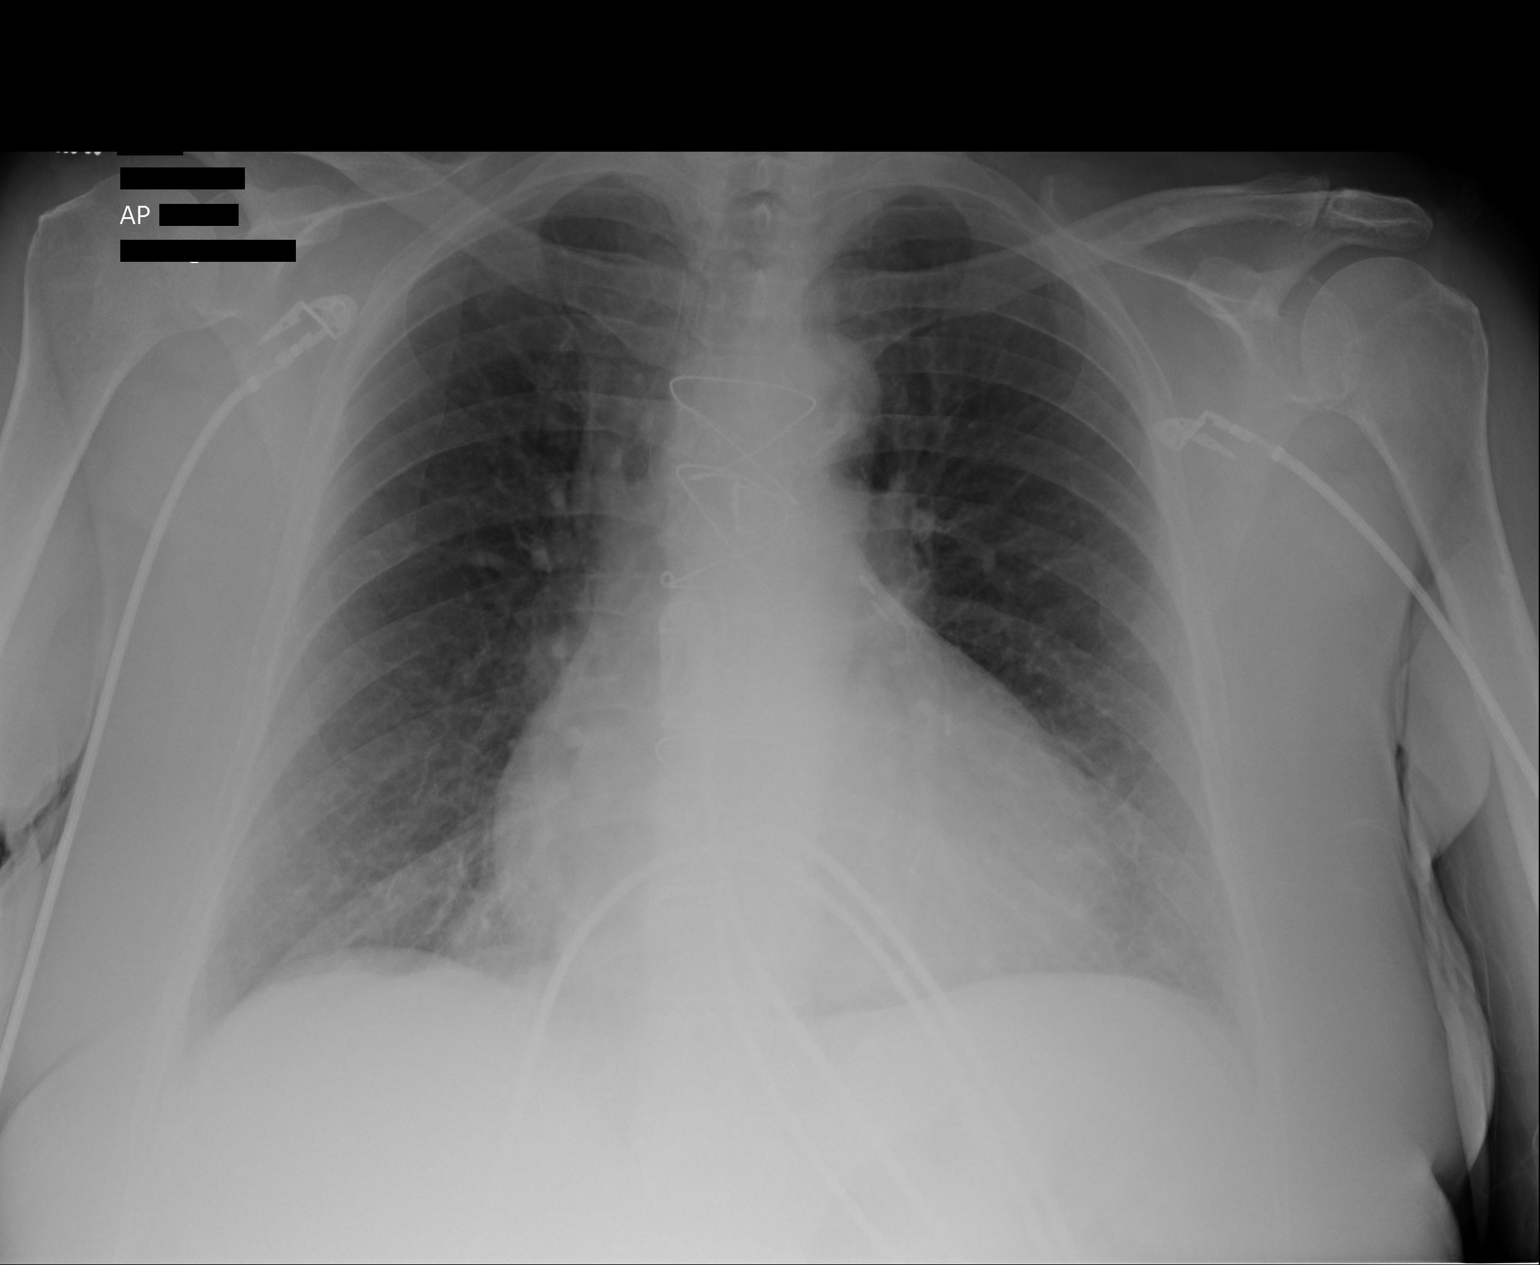

[1 of 1 positions shown; findings below may reference images not displayed]

PROCEDURE:     DXR - DXR PORTABLE CHEST SINGLE VIEW  - October 17, 2011  [DATE]

RESULT:     Comparison is made to prior study dated 10/07/2011.

There is no evidence of focal infiltrates, effusions or edema. The cardiac
silhouette is enlarged indicative of cardiomegaly. The visualized bony
skeleton is unremarkable. The patient is status post median sternotomy and
coronary artery bypass grafting.
IMPRESSION: Cardiomegaly without evidence of acute cardiopulmonary
disease.

## 2014-02-10 ENCOUNTER — Inpatient Hospital Stay: Payer: Self-pay | Admitting: Internal Medicine

## 2014-02-10 LAB — BASIC METABOLIC PANEL
ANION GAP: 12 (ref 7–16)
BUN: 21 mg/dL — AB (ref 7–18)
Calcium, Total: 8.3 mg/dL — ABNORMAL LOW (ref 8.5–10.1)
Chloride: 107 mmol/L (ref 98–107)
Co2: 22 mmol/L (ref 21–32)
Creatinine: 0.69 mg/dL (ref 0.60–1.30)
EGFR (African American): >60
GLUCOSE: 246 mg/dL — AB (ref 65–99)
Osmolality: 292 (ref 275–301)
POTASSIUM: 3.6 mmol/L (ref 3.5–5.1)
Sodium: 141 mmol/L (ref 136–145)

## 2014-02-10 LAB — CBC
HCT: 40 % (ref 35.0–47.0)
HGB: 12.5 g/dL (ref 12.0–16.0)
MCH: 26.6 pg (ref 26.0–34.0)
MCHC: 31.2 g/dL — ABNORMAL LOW (ref 32.0–36.0)
MCV: 85 fL (ref 80–100)
Platelet: 296 10*3/uL (ref 150–440)
RBC: 4.7 10*6/uL (ref 3.80–5.20)
RDW: 14.5 % (ref 11.5–14.5)
WBC: 19 10*3/uL — ABNORMAL HIGH (ref 3.6–11.0)

## 2014-02-10 LAB — TROPONIN I: Troponin-I: <0.02 ng/mL

## 2014-02-11 LAB — LIPID PANEL
Cholesterol: 229 mg/dL — ABNORMAL HIGH (ref 0–200)
HDL: 49 mg/dL (ref 40–60)
Ldl Cholesterol, Calc: 156 mg/dL — ABNORMAL HIGH (ref 0–100)
Triglycerides: 120 mg/dL (ref 0–200)
VLDL Cholesterol, Calc: 24 mg/dL (ref 5–40)

## 2014-02-11 LAB — TROPONIN I
TROPONIN-I: 0.05 ng/mL
Troponin-I: 0.02 ng/mL

## 2014-02-11 LAB — CK TOTAL AND CKMB (NOT AT ARMC)
CK, TOTAL: 79 U/L (ref 26–192)
CK, Total: 91 U/L (ref 26–192)
CK-MB: 1.7 ng/mL (ref 0.5–3.6)
CK-MB: 1.3 ng/mL (ref 0.5–3.6)

## 2014-03-06 ENCOUNTER — Emergency Department: Payer: Self-pay | Admitting: Emergency Medicine

## 2014-03-19 ENCOUNTER — Emergency Department: Payer: Self-pay | Admitting: Emergency Medicine

## 2014-03-19 LAB — URINALYSIS, COMPLETE
BILIRUBIN, UR: NEGATIVE
BLOOD: NEGATIVE
Bacteria: NONE SEEN
GLUCOSE, UR: NEGATIVE mg/dL (ref 0–75)
KETONE: NEGATIVE
Leukocyte Esterase: NEGATIVE
Nitrite: NEGATIVE
Ph: 5 (ref 4.5–8.0)
Protein: 30
RBC,UR: 1 /HPF (ref 0–5)
Specific Gravity: 1.018 (ref 1.003–1.030)
WBC UR: 1 /HPF (ref 0–5)

## 2014-03-21 LAB — URINE CULTURE

## 2014-04-01 IMAGING — CR DG CHEST 1V PORT
1 series · 1 of 1 positions shown · non-contrast
Comparison: none

REASON FOR EXAM: Chest Pain
COMMENTS:

PROCEDURE:     DXR - DXR PORTABLE CHEST SINGLE VIEW  - January 18, 2012  [DATE]
RESULT:     Comparison: 10/17/2011

[ap]
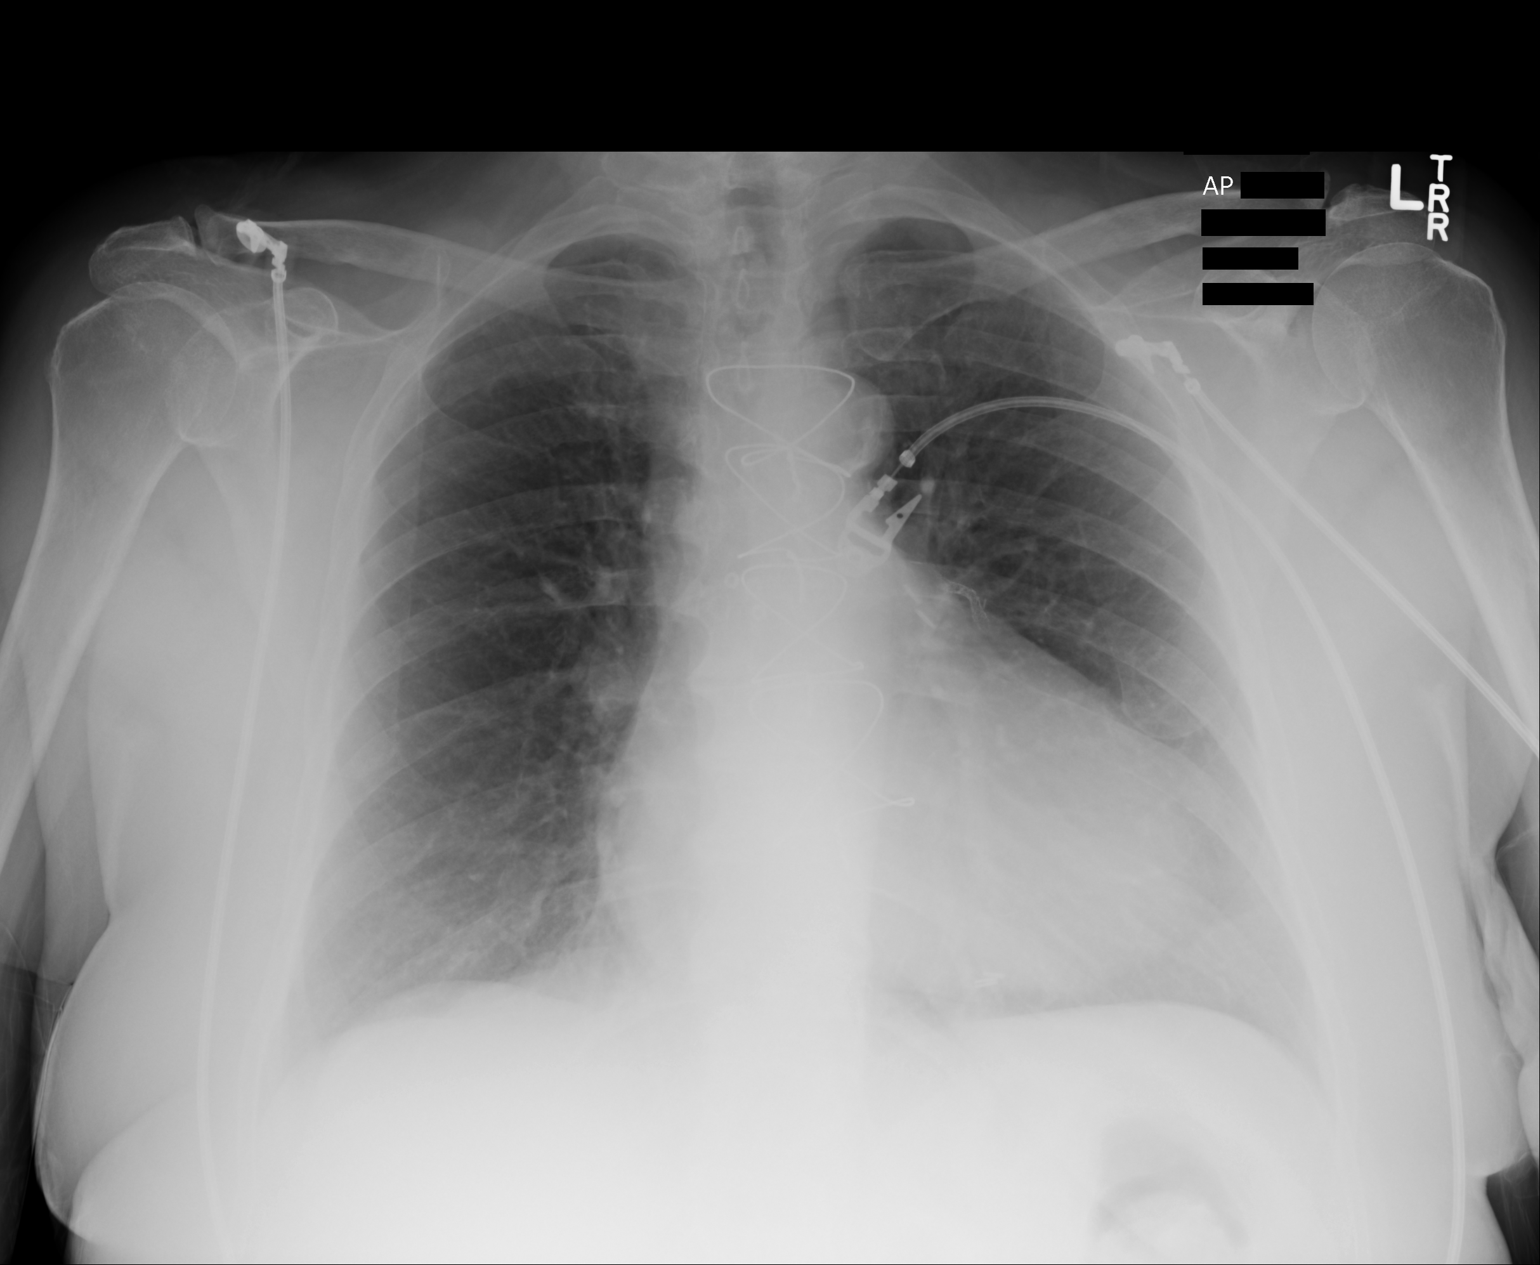

[1 of 1 positions shown; findings below may reference images not displayed]

FINDINGS: Single portable AP chest radiograph is provided.  There is no focal
parenchymal opacity, pleural effusion, or pneumothorax. The heart size is
enlarged. Prior CABG.. The osseous structures are unremarkable.
IMPRESSION: No acute disease of the che[REDACTED]

## 2014-04-28 ENCOUNTER — Emergency Department: Payer: Self-pay | Admitting: Student

## 2014-05-07 ENCOUNTER — Emergency Department: Payer: Self-pay | Admitting: Student

## 2014-05-30 IMAGING — CR DG CHEST 1V PORT
1 series · 1 of 1 positions shown · non-contrast
Comparison: none

REASON FOR EXAM: chest pain
COMMENTS:

[ap]
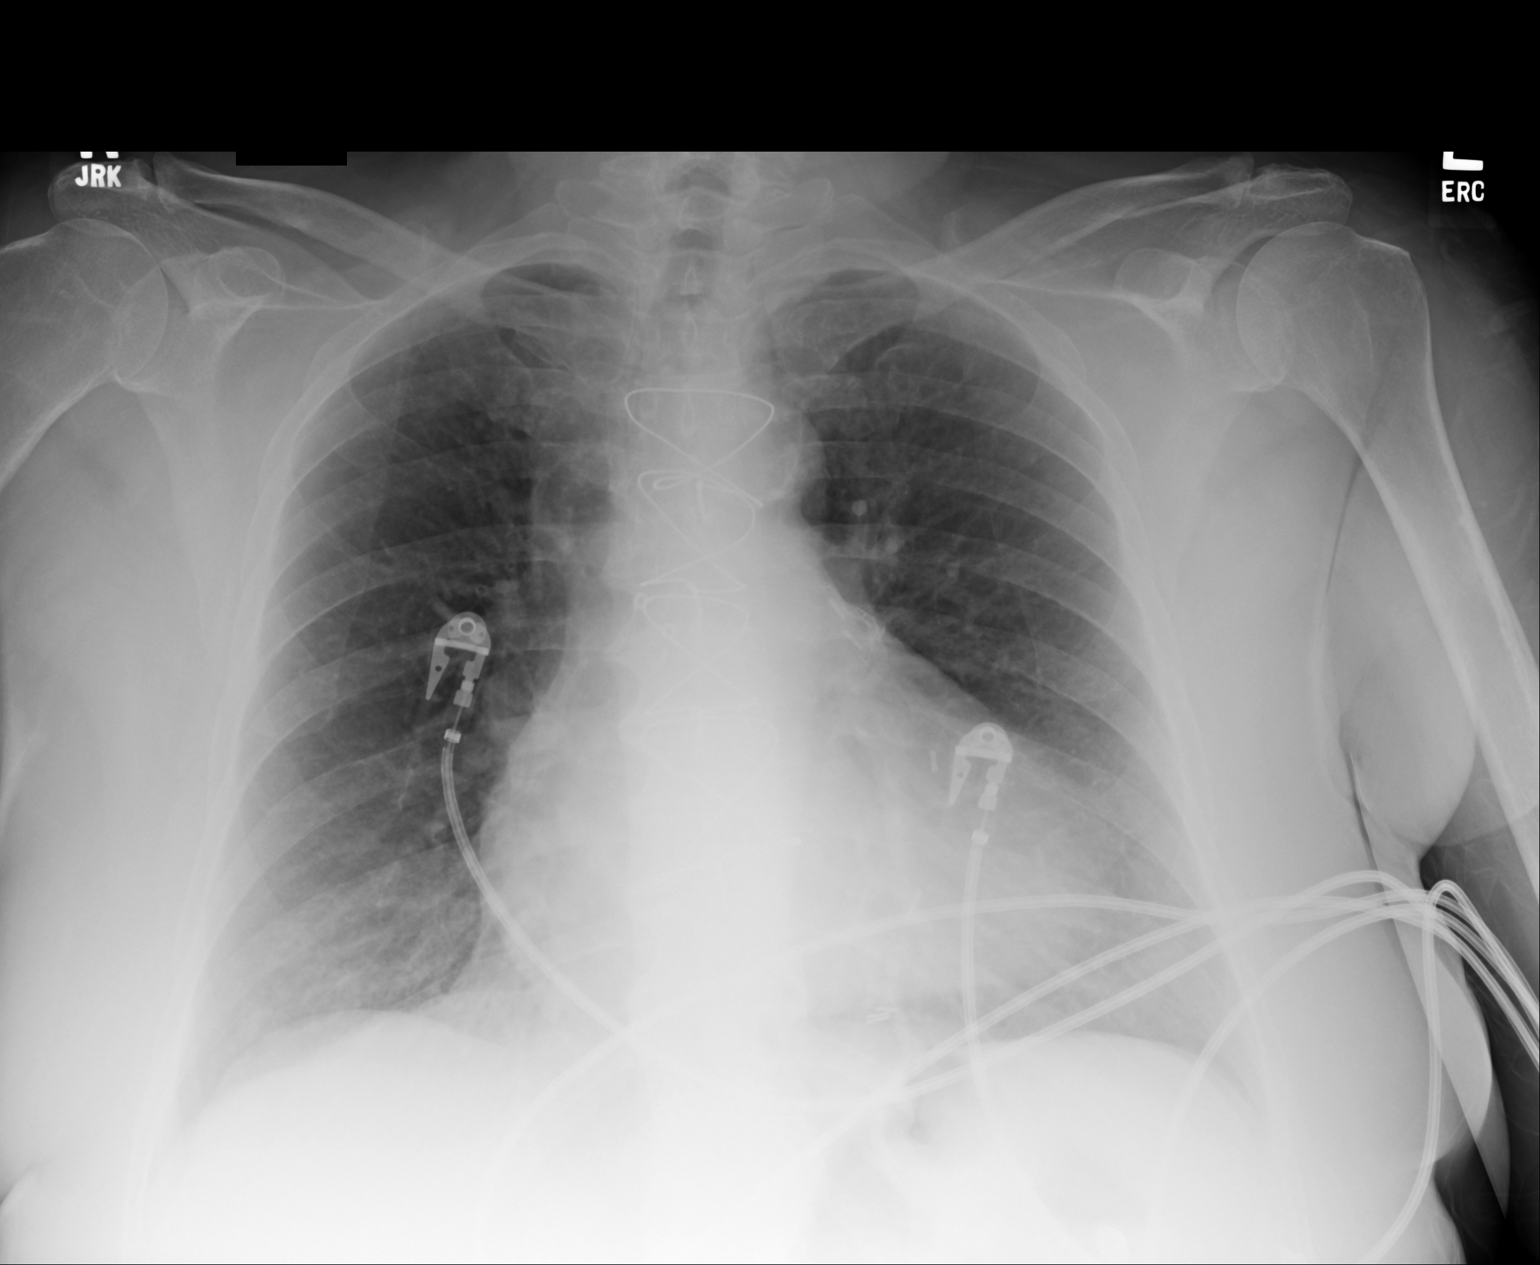

[1 of 1 positions shown; findings below may reference images not displayed]

PROCEDURE:     DXR - DXR PORTABLE CHEST SINGLE VIEW  - March 17, 2012  [DATE]

RESULT:     Comparison is made to the study dated January 18, 2012.

The cardiac silhouette is enlarged. The pulmonary vascularity is not
engorged. There is no pleural effusion or alveolar infiltrate. The patient
has undergone previous median sternotomy.
IMPRESSION: There is enlargement of the cardiac silhouette without
evidence of pulmonary vascular congestion. There is no evidence of
pneumonia. A followup PA and lateral chest x-ray would be of value if the
patient's symptoms persi[REDACTED]

## 2014-06-29 NOTE — H&P (Signed)
PATIENT NAME:  Robin [name], Robin [name] MR#:  [id number] DATE OF BIRTH:  February 13, 1941  DATE OF ADMISSION:  10/17/2011  PRIMARY CARE PHYSICIAN: Dr. Darreld Mclean   REFERRING PHYSICIAN: Dr. Bayard Males    CHIEF COMPLAINT: "My blood pressure is high."   HISTORY OF PRESENT ILLNESS: This is a 74 year old female with significant past medical history of coronary artery disease status post CABG, hypertension, paroxysmal atrial fibrillation, diabetes mellitus, hyperlipidemia presents with complaints of elevated blood pressure. Patient reports she checked her blood pressure at home where she had systolic blood pressure was 225. As well she reported one episode of midsternal chest pain which resolved spontaneously, lasted only for a few minutes, it was nonradiating with no shortness of breath, no diaphoresis, no nausea but complains of one episode of palpitations as well. Upon presentation to ED patient was hypertensive with systolic blood pressure of 203/79 which much improved after she was given one dose of Ativan as she appeared to be very anxious. Patient's EKG did not show any new ST or T wave changes and first set of cardiac enzymes were negative. Hospitalist service was requested to admit the patient for evaluation of her chest pain and hypertension management. Patient denies any fever, fatigue, weakness, any cough, productive sputum, hemoptysis, any leg swelling, any shortness of breath, lightheadedness, altered mental status, loss of consciousness.   PAST MEDICAL HISTORY:  1. Hypertension.  2. Hyperlipidemia.  3. Type 2 diabetes.  4. Coronary artery disease status post CABG. 5. Hypertriglyceridemia.  6. Gastroesophageal reflux disease.  7. History of paroxysmal atrial fibrillation.  8. Anxiety.   PAST SURGICAL HISTORY:  1. Coronary artery bypass grafting. 2. Cesarean section.   ALLERGIES: Percocet.   HOME MEDICATIONS: 1. Aspirin 81 mg daily.  2. Plavix 75 mg daily.  3. Metoprolol 50 mg p.o. b.i.d.   4. Lipitor 40 mg daily.  5. Glipizide 10 mg daily.  6. Enalapril 10 mg 4 times a day. 7. Nitrostat 0.4 mg sublingual every five minutes x3 doses as needed for chest pain.  8. Zantac 75 mg p.o. b.i.d.   FAMILY HISTORY: Significant for coronary artery disease, hypertension and diabetes mellitus.   SOCIAL HISTORY: Negative for tobacco, alcohol or illicit drug use.   REVIEW OF SYSTEMS: CONSTITUTIONAL: Patient denies any fever, fatigue, weakness. EYES: Denies blurry vision, double vision or pain. ENT: Denies tinnitus, ear pain, hearing loss. RESPIRATORY: Denies any cough, wheezing, hemoptysis, dyspnea. CARDIOVASCULAR: Denies any edema, orthopnea. Complains of palpitations and chest pain. GASTROINTESTINAL: Denies any nausea, vomiting, diarrhea, abdominal pain. GENITOURINARY: Denies dysuria, hematuria, renal colic. ENDOCRINE: Denies polyuria, polydipsia, heat or cold intolerance. HEMATOLOGY: Denies anemia, easy bruising, bleeding diathesis. INTEGUMENT: Denies any acne or rash. MUSCULOSKELETAL: Denies any neck pain, shoulder pain, knee pain, arthritis, swelling, or gout. NEUROLOGICAL: Denies numbness, weakness, dysarthria, epilepsy, tremors. PSYCH: Has anxiety and nervousness. Denies any schizophrenia or insomnia or bipolar disorder.   PHYSICAL EXAMINATION:  VITAL SIGNS: Temperature 98.2, pulse 65, respiratory rate 16, blood pressure 165/53, saturating 99% on room air. Highest blood pressure here 218/68.   GENERAL: Elderly female, is comfortable in bed, in no apparent distress.   HEENT: Head atraumatic, normocephalic. Pupils equal, reactive to light. Pink conjunctivae. Anicteric sclerae. Moist oral mucosa.   NECK: Supple. No thyromegaly. No JVD.   CHEST: Good air entry bilaterally. No wheezing, rales, rhonchi.   CARDIOVASCULAR: S1, S2 heard. No rubs, murmur, gallops.   ABDOMEN: Soft, nontender, nondistended. Bowel sounds present.   EXTREMITIES: No edema. No clubbing. No cyanosis.  PSYCHIATRIC: Appropriate affect. Awake, alert x3. Intact judgment and insight.   NEUROLOGICAL: Cranial nerves grossly intact. Motor 5/5. Symmetrical sensation.   SKIN: Normal skin turgor. No rash.   LABORATORY, DIAGNOSTIC AND RADIOLOGICAL DATA: Glucose 155, BUN 19, creatinine 0.7, sodium 145, potassium 4, chloride 111, troponin less than 0.02, white blood cells 9.2, hemoglobin 12.5, hematocrit 37.2, platelets 249. Urinalysis negative.   ASSESSMENT AND PLAN:  1. Chest pain. This is most likely related to hypertensive urgency versus anxiety. Will cycle troponins. Patient was already given 325 of aspirin in ED. Patient is already on beta blocker, ACE inhibitor and statin.  2. Hypertension, uncontrolled, might be worsened by anxiety as well. Will start p.r.n. Xanax, will add Imdur and will add p.r.n. hydralazine and will continue with metoprolol 50 b.i.d. and will change enalapril dose to 20 mg p.o. b.i.d.  3. Coronary artery disease. Will continue patient on aspirin, Plavix, statin, beta blocker, ACE inhibitor, and will add Imdur.  4. Diabetes mellitus. Will hold oral hypoglycemic agent and will continue with insulin sliding scale.  5. Gastroesophageal reflux disease. Will continue with Zantac.  6. History of hyperlipidemia. Will continue with statin.  7. Deep vein thrombosis prophylaxis. Sub-Q heparin.  8. CODE STATUS: Patient is FULL CODE.        TOTAL TIME SPENT ON ADMISSION AND PATIENT CARE: 55 minutes.   ____________________________ Starleen Arms, MD dse:cms D: 10/17/2011 07:17:56 ET T: 10/17/2011 08:07:39 ET JOB#: [id number]  cc: Starleen Arms, MD, <Dictator> Leanna Sato, MD [place] [employer] Teena Irani MD ELECTRONICALLY SIGNED 10/18/2011 0:16

## 2014-06-29 NOTE — Discharge Summary (Signed)
PATIENT NAME:  Robin [name], Robin [name] MR#:  [id number] DATE OF BIRTH:  05-19-1940  DATE OF ADMISSION:  10/17/2011 DATE OF DISCHARGE:  10/18/2011  ADMITTING PHYSICIAN:  Dr. Randol Kern DISCHARGING PHYSICIAN: Dr. Nemiah Commander    PRIMARY CARE PHYSICIAN: Dr. Darreld Mclean   CONSULTATIONS IN THE HOSPITAL: None.   DISCHARGE DIAGNOSES:  1. Stable angina.  2. Hypertensive urgency.  3. Diabetes mellitus with hemoglobin A1c of 8.1.  4. Uncontrolled hypertension.  5. Coronary artery disease status post bypass graft surgery.  6. Anxiety.  7. Hyperlipemia.   DISCHARGE HOME MEDICATIONS:  1. Zantac 75 mg p.o. b.i.d.  2. Aspirin 81 mg p.o. daily.  3. Glipizide 10 mg p.o. daily.  4. Lipitor 40 mg p.o. daily.  5. Plavix 75 mg p.o. daily.  6. Metoprolol 50 mg p.o. b.i.d.  7. Enalapril 20 mg p.o. daily.  8. Nitrostat sublingual 0.4-mg tablet every five minutes p.r.n. for chest pain.  9. Imdur 30 mg p.o. daily.  10. Amlodipine 5 mg p.o. daily.  11. Hydralazine 25 mg p.o. t.i.d.  12. Xanax 0.25 mg p.o. b.i.d. p.r.n.   DISCHARGE DIET: Low sodium and carbohydrate-controlled 1800-calorie diet.   DISCHARGE ACTIVITY: As tolerated.     FOLLOWUP INSTRUCTIONS:  1. Primary care physician followup in 1 to 2 weeks.  2. The patient has a blood pressure monitor at home and she was advised to check at home as needed and call M.D. if systolic blood pressure is greater than 180 or  diastolic greater than 110.   LABS AND IMAGING STUDIES: WBC 7.5, hemoglobin 12, hematocrit 35.6, platelet count 244. Sodium 145, potassium 4.1, chloride 110, bicarbonate 24, BUN 15, creatinine 0.6, glucose 102, calcium 8.7, ALT 21, AST 23, alkaline phosphatase 114, total bilirubin 0.3, albumin 3.2. Troponins have remained negative. Urinalysis negative for infection. Chest x-ray showing cardiomegaly, no evidence of acute cardiopulmonary disease. Hemoglobin A1c is 8.1.  BRIEF HOSPITAL COURSE: Ms. Resch is a 74 year old female with past medical  history significant for coronary artery disease, uncontrolled diabetes, and hypertension who presented to the ER secondary to elevated blood pressure. The patient has a blood pressure monitor at home and she checked her pressure. Her systolic was greater than 225. In the ER, her pressure was 203/79.  She also had some chest pain associated with it.  1. Chest pain, likely angina secondary to elevated blood pressure. 2. Uncontrolled hypertension with hypertensive urgency. She was started on IV hydralazine p.r.n. and her home medications enalapril were continued. However, her blood pressure was persistently requiring IV p.r.n. medications. The patient states that her normal systolic pressure runs anywhere from 160 to 180. Imdur, p.o. hydralazine, and also p.o. Norvasc were added with adequate control of blood pressure while in the hospital. She was insistent on going home as she has been asymptomatic, so all the instructions were given to the patient and she was advised to call her PCP or come to the ER if her pressure remains elevated or if she becomes symptomatic. All her other home medications for diabetes, hyperlipidemia, and coronary artery disease were continued without any changes. Her course has been otherwise uneventful in the hospital.   DISCHARGE CONDITION: Stable.   DISCHARGE DISPOSITION: Home.       TIME SPENT ON DISCHARGE: 45 minutes.     ____________________________ Enid Baas, MD rk:bjt D: 10/18/2011 17:08:29 ET T: 10/19/2011 12:01:38 ET JOB#: [id number]  cc: Enid Baas, MD, <Dictator> Leanna Sato, MD Enid Baas MD ELECTRONICALLY SIGNED 10/24/2011 14:18

## 2014-07-02 NOTE — Discharge Summary (Signed)
PATIENT NAME:  Robin [name], Robin [name] MR#:  [id number] DATE OF BIRTH:  02/08/1941  DATE OF ADMISSION:  07/05/2012 DATE OF DISCHARGE:  07/06/2012  ADMITTING DIAGNOSES:  1.  Palpitations.  2.  Chest pain.   DISCHARGE DIAGNOSES: 1.  Palpitation due to atrial fibrillation with rapid ventricular response as a result of medication noncompliance.  2.  Chest pain felt to be due to atrial fibrillation with rapid ventricular response. Cardiac enzymes negative, status post cardiology evaluation.  3.  Medication noncompliance.  4.  History of chronic atrial fibrillation.  5.  History of coronary artery disease, status post coronary artery bypass graft.  6.  Hypertension.  7.  Diabetes.  8.  Gastroesophageal reflux disease.  9.  Hyperlipidemia.  10.  Chronic anxiety.  11.  Status post cesarean sections.   CONSULTANT: Dr. Lady Gary.  PERTINENT LABORATORY, DIAGNOSTIC AND RADIOLOGICAL DATA:  Admitting glucose was 158, BUN 25, creatinine 0.66, sodium 140, potassium 3.6, chloride 106. CO2 was 26. Magnesium was 1.7. Cholesterol:  LDL was 128, triglycerides 170, total cholesterol 211, HDL was 49. Troponin less than 0.02 x 3. TSH was 4.09. WBC of 9.2, hemoglobin 11.9, platelet count was 278.   EKG showed atrial fibrillation with RVR. Chest x-ray showed no acute cardiopulmonary processes.   HOSPITAL COURSE: Please refer to H and P done by the admitting physician. The patient is a 74 year old white female with known history of hypertension, history of recurrent atrial fibrillation who is on amiodarone at home, who sees Dr. Lady Gary.   According to him, the patient is resistant to be on medications, and she stated that she stopped taking her amiodarone because she did not get the prescription filled. She presented to the ED with complaint of palpitations and chest pain. She was noted to be in a-fib with RVR, heart rate in the 150s, had to be placed on IV diltiazem drip and placed in the unit. The patient was seen by Dr. Lady Gary  and was started on amiodarone.  She converted to normal sinus rhythm. At this point, he recommended continuing amiodarone. She is resistant to getting on new medications. She is doing much better, heart rate is much improved. She is stable for discharge.   DISCHARGE MEDICATIONS: Nitroglycerin 0.4 p.r.n. chest pain, hydralazine 25, 1 tab p.o. b.i.d., amiodarone 200 daily, aspirin 81 mg 1 tab p.o. daily, enalapril 10, 1 tab p.o. b.i.d., glipizide 10, 1 tab p.o. daily, Plavix 75 p.o. daily.   DIET: Low-sodium, low-fat, low-cholesterol.   ACTIVITY: As tolerated.   DISCHARGE INSTRUCTIONS AND FOLLOWUP:   1.  Follow up with primary MD in 1 to 2 weeks.  2.  Follow at [facility] in 2 to 4 weeks.  3.  The patient was instructed to take her medications as directed.    TIME SPENT: 35 minutes spent on the discharge.    ____________________________ Lacie Scotts. Allena Katz, MD shp:cb D: 07/06/2012 12:44:21 ET T: 07/06/2012 14:59:25 ET JOB#: [id number]  cc: Junia Nygren H. Allena Katz, MD, <Dictator> Charise Carwin MD ELECTRONICALLY SIGNED 07/11/2012 20:06

## 2014-07-02 NOTE — Discharge Summary (Signed)
PATIENT NAME:  Robin [name], Robin [name] MR#:  [id number] DATE OF BIRTH:  May 22, 1940  DATE OF ADMISSION:  07/23/2012  ADMITTING PHYSICIAN:  Dr.  Mliss Fritz Elgergawy  DATE OF DISCHARGE:  07/23/2012  DISCHARGING PHYSICIAN:  Dr. Enid Baas  PRIMARY MD:  Dr. Darreld Mclean  CONSULTATIONS IN THE HOSPITAL: Cardiology consultation by Dr. Gwen Pounds.   DISCHARGE DIAGNOSES: 1.  Coronary artery disease, status post bypass graft surgery.  2.  Chest pain, likely related to gastroesophageal reflux disease.  3.  Noncompliance to medications.  4.  Non-insulin-dependent diabetes mellitus.  5.  Hypertension.  6.  Hyperlipidemia.  7.  Paroxysmal atrial fibrillation   DISCHARGE HOME MEDICATIONS:  1.  Nitroglycerin sublingual 0.4 mg every 5 minutes as needed for chest pain.  2.  Hydralazine 25 mg p.o. b.i.d.  3.  Amiodarone 200 mg p.o. daily.  4.  Aspirin 81 mg p.o. daily.  5.  Enalapril 10 mg p.o. b.i.d.  6.  Glipizide 10 mg p.o. daily.  7.  Plavix 75 mg p.o. daily.  8.  Simvastatin 20 mg p.o. daily.  9.  Protonix 40 mg p.o. b.i.d.   DISCHARGE DIET:  Low-sodium and ADA 1800 diet.   DISCHARGE ACTIVITY:  As tolerated.   FOLLOWUP INSTRUCTIONS: 1.  Cardiology followup in 2 weeks.  2.  Followup with PCP in two weeks.   LABS AND IMAGING STUDIES:  Echo Doppler showing normal LV systolic function, EF is 55% to 60%. Troponins have remained negative x 3. Chest x-ray showing mild interstitial edema, no alveolar pneumonia. Urinalysis negative for any infection. WBCs 8.5, hemoglobin 11.3, hematocrit 34.5, platelet count 236.  Sodium 143, potassium 4.0, chloride 109, bicarb 29, BUN 19, creatinine 0.71, glucose 107 and calcium 9.2.   BRIEF HOSPITAL COURSE: Robin [name] is a 74 year old female with a past medical history significant for coronary artery disease, status post bypass graft surgery, hypertension, diabetes, paroxysmal atrial fibrillation, not on anticoagulation due to noncompliance and not wanting to be on  anticoagulation in the past, presented to the hospital with chest pain.   1.  Chest pain. This is the patient's third hospitalization recently with similar complaints. She has not had recent stress test, though this time she was admitted, monitored on telemetry. Although her chest pain was very atypical and it was more like a burning kind of pain, which indicated reflux as the primary problem, but because of her risk factors and cardiac history, she was admitted to have a stress test done. The patient said she could not walk on the treadmill and went done for [employer]. She got very anxious, with slight dystocia of Lexiscan, and refused to have further Myoview. Since her enzymes were negative x 3 and she had an echo done which did not show any wall motion abnormalities. EF is 55% to 60%. After discussing with the cardiologist, she is being discharged home. All her home medications including aspirin, Plavix, nitroglycerin were continued. A statin was started in the hospital.   2.  Paroxysmal atrial fibrillation. She is on amiodarone and she is in normal sinus rhythm at this time. As mentioned in the past history, she has refused anticoagulation in the past and has been reluctant to start any new medications.   3.  Gastroesophageal reflux disease. Protonix has been increased to twice a day. The patient has not had further chest pain while in the hospital.   4.  Hypertension. She is on hydralazine and enalapril.   5.  Diabetes mellitus. Patient on glipizide. Her course  has been otherwise uneventful in the hospital.   DISCHARGE CONDITION: Stable.   DISCHARGE DISPOSITION: Home.   TIME SPENT ON DISCHARGE is 45 minutes.     ____________________________ Enid Baas, MD rk:mr D: 07/24/2012 17:44:00 ET T: 07/24/2012 20:51:52 ET JOB#: [id number]  cc: Enid Baas, MD, <Dictator> Lamar Blinks, MD Leanna Sato, MD   Enid Baas MD ELECTRONICALLY SIGNED 08/01/2012 12:40

## 2014-07-02 NOTE — H&P (Signed)
PATIENT NAME:  Robin [name], Robin [name] MR#:  [id number] DATE OF BIRTH:  05-21-1940  DATE OF ADMISSION:  03/17/2012  REFERRING PHYSICIAN: Lucrezia Europe, MD.  PRIMARY CARE PHYSICIAN:  Darreld Mclean, MD.  CARDIOLOGIST:  Harold Hedge, MD  CHIEF COMPLAINT: Chest pain.   HISTORY OF PRESENT ILLNESS: The patient is a very nice 74 year old female who has history of coronary artery disease. She is status post CABG.  She also has paroxysmal atrial fibrillation. She is very anxious. She has hypertension, diabetes, hyperlipidemia and comes today with a complaint of chest pain that started around 1:00 a.m. today. The patient states that she was doing good during the past couple of months. She has not had any symptoms. No more chest pain. Her blood pressure has been overall well controlled but she goes to bed and wakes up in the middle of the night with significant chest pressure, feels like her heart is racing.  The intensity of the pressure is referred as a 5/10 on the scale of pain.  No radiation no diaphoresis, no nausea, no vomiting.  The pain is pressure-like. The patient denies any dyspnea associated with it or orthopnea.  The patient has been seen in the ER and on admission she was found to have elevated an heart rate at the level of 160.   The patient was given 2 boluses of diltiazem and her heart rate slowed down after every bolus but is starting to come up again. The patient's blood pressure has been elevated at the beginning but now is starting to come down up to 107/58.  After the boluses it dropped down to 80/61. The patient, at this moment is stable, sitting down on the side of the bed and feeling much better but her heart rate is starting to go up.  We noticed that the patient has couplets, triplets and short runs of V. tach on the monitor. For this reason the patient is admitted to the CCU for treatment of her condition.    REVIEW OF SYSTEMS:  CONSTITUTIONAL: Denies any fever, pain, weight loss or weight  gain.  EYES: No blurry vision. No double vision. No changes in her eyesight.  ENT: No tinnitus. No difficulty swallowing. No symptoms of upper respiratory infections. RESPIRATORY:  No cough. No wheezing. No hemoptysis. No COPD.  CARDIOVASCULAR: Positive chest pain. Positive palpitations.  No syncope, no significant edema. No orthopnea.  GASTROINTESTINAL: No nausea, vomiting, abdominal pain, constipation or diarrhea. No rectal bleeding. No hematemesis or melena.  GENITOURINARY: Positive dysuria. Positive increased frequency. Negative hematuria or kidney stones.  GYNECOLOGIC: No breast masses. No other  GYN issues.  ENDOCRINE: No polyuria, polydipsia or polyphagia. She has diabetes. She states it is okay controlled but she does not check very often.  Thyroid problems: She denies. Her TSH is 3.2. HEMATOLOGIC/LYMPHATIC:   No anemia, easy bruising or swollen glands.  SKIN: No rashes. No lesions or moles. MUSCULOSKELETAL: No significant back pain, neck pain or swollen joints.  NEUROLOGIC: No numbness, tingling. No CVAs. No TIAs.  PSYCHIATRIC: No depression. Positive anxiety and being nervous, occasional insomnia.   PAST MEDICAL HISTORY: 1.  Coronary artery disease.  2.  Status post CABG.  3.  Hypertension.  4.  Hyperlipidemia.  5.  Non-insulin-dependent diabetes type 2.  6.  Hypertriglyceridemia.  7.  GERD. 8.  History of paroxysmal atrial fibrillation.  9.  Chronic anxiety.   PAST SURGICAL HISTORY:  Status post C-section, status post coronary artery bypass graft.   FAMILY HISTORY: Positive for coronary  artery disease in both parents, hypertension and diabetes.   SOCIAL HISTORY: The patient lives with her son. She is retired. She denies current tobacco use. She smoked for a couple years when she was young but not a significant amount of time. She does not drink or use any drugs.   ALLERGIES:  THE PATIENT IS ALLERGIC TO PERCOCET.  CURRENT MEDICATIONS: Include aspirin 81 mg daily.    Enalapril 10 mg twice a day.  Nitrostat p.r.n.  Plavix 75 mg once a day.  Albuterol inhaler p.r.n.  Hydralazine 25 mg twice daily. Metoprolol 50 mg twice daily. Lipitor 40 mg once daily.  Glipizide 10 mg once daily.  Enalapril 10 mg 4 times a day, apparently at some point, but no longer taking.  Also from her last discharge summary: Amlodipine 5 mg once daily.  Imdur 10 mg once daily. Xanax 0.25 mg b.i.d.   PHYSICAL EXAMINATION:  VITAL SIGNS:  Blood pressure has been as low as 83/62, right now 135/58, heart rate right now around 120 to 130 on the monitor, respirations 18, temperature 97.9, O2 sats 99% on room air.  GENERAL: The patient is alert, oriented x 3 in no acute distress. No respiratory distress, stable. HEENT: Pupils are equal and reactive. Extraocular movements are intact. Mucosa is moist. No oral lesions. No oropharyngeal exudates. Anicteric sclerae.  NECK: Supple. No JVD. No thyromegaly. No adenopathy. No masses. No carotid bruits.  CARDIOVASCULAR: Irregularly irregular. No murmurs, no rubs or gallops are appreciated at this moment. No pain to palpation to the anterior chest. No displacement of PMI.  LUNGS: Clear without any wheezing or crepitus. No rales. No use of accessory muscles. No dullness to percussion.  ABDOMEN: Soft, nontender, nondistended. No hepatosplenomegaly. No masses. Bowel sounds are positive.  EXTREMITIES: No edema, no cyanosis, no clubbing. Positive onychomycosis.  MUSCULOSKELETAL: No significant joint abnormalities or joint effusions noted. PSYCHIATRIC:  Slightly anxious, but overall corporative and no signs of agitation.  SKIN: Without any rashes or petechiae.  NEUROLOGIC: Cranial nerves II through XII intact without any significant focal deficits.   LYMPHATIC: Negative for lymphadenopathies in neck or supraclavicular areas.  LABORATORY AND DIAGNOSTIC DATA:  Glucose 240, BUN 23, creatinine 0.7, sodium 143, potassium 3.7, calcium 8.4. Troponin is negative. TSH 3.2.   White count 11.9, hemoglobin 11.9, platelets 211. Influenza negative.  EKG: Atrial fibrillation with RVR, occasional PVCs, then couplets, then triplets, then short runs of V. tach versus aberrant atrial fibrillation. She has had a left anterior fascicular block and a prolonged QT with QTc of  486. Chest x-ray:  The patient has a globular heart, possible left pleural effusion and mild signs of congestion for what I am going to check a BNP.   ASSESSMENT AND PLAN: 1.  Atrial fibrillation with rapid ventricular response and occasional aberrant atrial fibrillation versus ventricular fibrillation.  2.  The patient is at risk of developing significant deadly arrhythmias, ventricular fibrillation, ventricular fibrillation.  At this moment, I am going to put her on the Critical Care Unit, keep her monitored, check her magnesium and replace magnesium, give her amiodarone drip at first 150 mg bolus then drip as per protocol. I spoke with Dr. Lady Gary; he agreed with the plan of care at this moment.  We are going to continue to check a series of cardiac enzymes, monitor for development of arrhythmias. Her blood is low with diltiazem for what we are going to change him to amiodarone which is also at risk to decrease her blood  pressure but is a medication that will help in correcting her underlying arrhythmia and possible development of ventricular fibrillation and ventricular tachycardia.   3.  We will monitor her closely on the Critical Care Unit. What is the cause of this, likely, her underlying coronary artery disease, but also cannot exclude the possibility of an infection.  The patient is having I will symptoms of urinary tract infection and that could be the cause of the atrial fibrillation in some cases.   4.  We are going to do a urinalysis and urine culture just treated for urinary infection if necessary.  5.  I am going to give her Lovenox 1 mg/kg, continue her Plavix and her aspirin.  Overall, the patient is  stable at this moment, but has significant potential for decompensation and arrhythmias, for what she is going to be watched closely.  6.  Diabetes, uncontrolled. She is not taking all her medications.  We are going to check a hemoglobin A1c and restart her medications.  7.  Hyperlipidemia. Continue statin and check lipid profile.  8.  Hypertension.  At this moment her blood pressure is low. We are going to hold on her blood pressure medications since she is on amiodarone and just write for p.r.n. medications.  9.  Gastroesophageal reflux disease. Continue proton pump inhibitor. 10.  Other medical problems including anxiety are stable.   TIME SPENT: I spent about 50 minutes with this admission.  Critical care time: 50 minutes    ____________________________ Felipa Furnace, MD rsg:ct D: 03/17/2012 08:13:02 ET T: 03/17/2012 08:53:37 ET JOB#: [id number]  cc: Felipa Furnace, MD, <Dictator> Tajuan Dufault Juanda Chance MD ELECTRONICALLY SIGNED 03/21/2012 13:46

## 2014-07-02 NOTE — Consult Note (Signed)
PATIENT NAME:  Robin [name], Robin [name] MR#:  [id number] DATE OF BIRTH:  Dec 17, 1940  CARDIOLOGY CONSULTATION REPORT  DATE OF CONSULTATION:  07/23/2012  PRIMARY CARE PHYSICIAN:  Dr. Marvis Moeller.  CONSULTING PHYSICIAN:  Dr. Randol Kern,  REASON FOR CONSULTATION: Unstable angina with coronary artery disease, stenting, hypertension, diabetes, hyperlipidemia and abnormal EKG.   CHIEF COMPLAINT: "I had chest pain."   HISTORY OF PRESENT ILLNESS: This is a 74 year old female with known coronary artery disease, status post previous coronary bypass graft in 2005, with multiple stents in the past. The patient also has had hypertension, on appropriate medication management, with diabetes and hyperlipidemia, well-controlled prior to admission. The patient has had a new onset of substernal chest discomfort radiating into her back and her elbow, with a burning sensation consistent with possible gastroesophageal reflux versus progressive anginal symptoms. These anginal symptoms are [place] class IV anginal symptoms due to progression of chest pain at rest. When she was seen in the Emergency Room she did have relief, with no further chest discomfort at this time. She does have a normal troponin, CK-MB, without evidence of myocardial infarction, with an EKG showing normal sinus rhythm, left axis deviation and right bundle branch block.   The remainder of the review of systems is negative for vision change, ringing in the ears, hearing loss, cough, congestion, heartburn, nausea, vomiting, diarrhea, bloody stools, stomach pain, extremity pain, leg weakness, cramping of the buttocks, known blood clots, headaches, blackouts, dizzy spells, nosebleeds, congestion, trouble swallowing, frequent urination, urination at night, muscle weakness, numbness, anxiety, depression, skin lesions, skin rashes.   PAST MEDICAL HISTORY: 1.  Coronary artery disease, status post coronary artery bypass graft and stenting.  2.  Hypertension.  3.   Hyperlipidemia.  4.  Diabetes.  5.  Gastric ulcer.   FAMILY HISTORY: Father had a myocardial infarction and death at an early age.   SOCIAL HISTORY: Currently denies alcohol or tobacco use.   ALLERGIES: AS LISTED.   MEDICATIONS: As listed.   PHYSICAL EXAMINATION: VITAL SIGNS: Blood pressure is 156/64 bilaterally, heart rate 64 upright, reclining, and regular.  GENERAL: She is a well-appearing female in no acute distress.  HEENT: No icterus, thyromegaly, ulcers, hemorrhage, or xanthelasma.  CARDIOVASCULAR: Regular rate and rhythm. Normal S1, S2, without murmur, gallop, or rub. PMI is normal size and placement. Carotid upstroke normal, without bruit. Jugular venous pressure is normal.  LUNGS: Lungs have a few basilar crackles, with normal respirations.  ABDOMEN: Soft and nontender, without hepatosplenomegaly or masses. Abdominal aorta is normal size, without bruit.  EXTREMITIES: Show 2+ radial, femoral, dorsal pedal pulses, with no lower extremity edema, cyanosis, clubbing or ulcers.  NEUROLOGIC: She is oriented to time, place, and person, with normal mood and affect.   ASSESSMENT: A 74 year old female with hypertension, hyperlipidemia, coronary artery disease status post coronary artery bypass graft, diabetes, atrial fibrillation, now maintaining normal sinus rhythm, with acute issues with unstable angina and [place] class IV anginal symptoms, with no current evidence of myocardial infarction, with abnormal EKG, needing further medication management and treatment options.   RECOMMENDATIONS: 1.  Continue serial ECG and enzymes to assess for the possibility of myocardial infarction.  2. Echocardiogram for LV systolic dysfunction, valvular heart disease causing chest pain, and other symptoms.  3.  Continue amiodarone for maintenance of normal sinus rhythm of atrial fibrillation.  4.  Aspirin and Plavix for further risk reduction of possible in-stent thrombosis.  5. Hypertension control with  hydralazine, renal protection with ACE inhibitor, and further diabetes control with  insulin injection.  6. LexiScan infusion Myoview to assess for myocardial ischemia and further treatment thereof, as necessary.  7. Nitroglycerin, if able, for further evaluation and treatment options.     ____________________________ Lamar Blinks, MD bjk:dm D: 07/23/2012 10:29:45 ET T: 07/23/2012 11:07:28 ET JOB#: [id number]  cc: Lamar Blinks, MD, <Dictator> Lamar Blinks MD ELECTRONICALLY SIGNED 07/24/2012 7:57

## 2014-07-02 NOTE — H&P (Signed)
PATIENT NAME:  Robin [name], Robin [name] MR#:  [id number] DATE OF BIRTH:  Jul 28, 1940  DATE OF ADMISSION:  07/05/2012  PRIMARY CARE PHYSICIAN: Leanna Sato, MD  REFERRING PHYSICIAN: Caleen Jobs. Braud, MD  CHIEF COMPLAINT: Palpitations and chest pain.  HISTORY OF PRESENT ILLNESS: The patient is a 74 year old Caucasian female known to have history of systemic hypertension, recurrent atrial fibrillation, placed on amiodarone, and she converted to sinus rhythm. She has history of coronary artery disease, status post CABG, diabetes mellitus, hyperlipidemia, chronic anxiety. She was in her usual state of health until last evening around 11:00 at night, when she developed palpitations along with midsternal chest pain, described as burning-like indigestion, with pain radiating to both arms. The severity of the pain was 7 on a scale of 10, then gradually went down to right now it is 1 on a scale of 10. She denies any shortness of breath. No nausea, no vomiting. No fever. No cough. No abdominal pain. Evaluation here at the Emergency Department revealed recurrence of her atrial fibrillation with rapid ventricular rate. The initial rate on monitor was 150 per minute. Her troponin is normal. The patient was started on IV diltiazem drip, and she is in the process of being admitted to the intensive care unit.   REVIEW OF SYSTEMS:  CONSTITUTIONAL: Denies any fever. No chills. No fatigue.  EYES: No blurring of vision. No double vision.  ENT: No hearing impairment. No sore throat. No dysphagia.  CARDIOVASCULAR: Reports chest pain as above. No shortness of breath, but reports palpitations. No edema. No syncope.  RESPIRATORY: No shortness of breath. She has chest pain, but no cough, no hemoptysis.  GASTROINTESTINAL: No abdominal pain. No vomiting. No diarrhea.  GENITOURINARY: No dysuria, but she reported some frequency of urination after the palpitations started. No dysuria.  MUSCULOSKELETAL: No joint pain, no swelling. No  muscular pain or swelling.  INTEGUMENTARY: No skin rash. No ulcers.  NEUROLOGY: No focal weakness. No seizure activity. No headache.  PSYCHIATRY: She has a great deal of anxiety. This is chronic for her, and she keeps asking the same question over and over, even if I answered that. No depression.  ENDOCRINE: No polyuria or polydipsia. No heat or cold intolerance.   PAST MEDICAL HISTORY:  1. Recurrent atrial fibrillation.  2. History of coronary artery disease, status post coronary artery bypass graft.  3. Systemic hypertension.  4. Diabetes mellitus type 2.  5. Gastroesophageal reflux disease.  6. Hyperlipidemia.  7. Chronic anxiety.   PAST SURGICAL HISTORY: Coronary artery bypass graft. History of C-section and tonsillectomy.   FAMILY HISTORY: Her father died at age of 7 during open heart surgery. Her mother died in her 39s, and she suffered at that time from hypertension and heart problems.   SOCIAL HISTORY: She is widowed. Lives at home with her son.   ADMISSION MEDICATIONS: The patient does not know her medications. According to our records, in the past, she was on:  1. Aspirin 81 mg a day. 2. Plavix 75 mg a day.  3. Enalapril 10 mg twice a day.  4. Nitroglycerin sublingual p.r.n. 5. Hydralazine 25 mg twice a day.  6. Metoprolol 50 mg twice a day.  7. Amiodarone 200 mg a day.  8. Glipizide 10 mg a day.   ALLERGIES: PERCOCET CAUSING ALTERED MENTAL STATUS.   PHYSICAL EXAMINATION:  VITAL SIGNS: Blood pressure 114/64, respiratory rate 20. Pulse 150 initially, then gradually it is down to 110 to 115. She is on the IV diltiazem drip.  Her temperature is 97.8. Oxygen saturation 97%.  GENERAL APPEARANCE: Elderly female lying in bed in no acute distress.  HEAD: No pallor. No icterus. No cyanosis. Ear examination revealed normal hearing. No discharge, no lesions. Oropharyngeal examination revealed normal lips and tongue. She is edentulous, having dentures for the lower jaw only. Nasal  examination revealed no ulcers, no discharge, no bleeding. Eye examination revealed normal iris and conjunctivae. Pupils are constricted, both of them. Did not see reactivity to light.  NECK: Supple. Trachea at midline. No thyromegaly. No cervical lymphadenopathy. No masses.  HEART: Revealed irregular S1, S2. No S3, S4. No murmur. No gallop. No carotid bruits.  RESPIRATORY: Normal breathing pattern without use of accessory muscles. No rales. No wheezing.  ABDOMEN: Soft, without tenderness. No hepatosplenomegaly. No masses. No hernias.  SKIN: No ulcers. No subcutaneous nodules.  MUSCULOSKELETAL: No joint swelling. No clubbing.  NEUROLOGIC: Cranial nerves II through XII are intact. No focal motor deficit.  PSYCHIATRIC: The patient is alert, oriented to place, people and time. Mood and affect: She looks apprehensive and anxious.   LABORATORY FINDINGS: Her EKG and monitor showed atrial fibrillation with rapid ventricular rate at 150 per minute. Her 12-lead EKG showed again atrial fibrillation with rapid ventricular rate, at rate of 129 per minute, and evidence of right bundle branch block. Chest x-ray showed cardiomegaly. No effusion. No consolidation. Serum glucose 158, BUN 25, creatinine 0.6, sodium 140, potassium 3.6. Magnesium was 1.7. Normal liver function tests and liver transaminases. Troponin was less than 0.02, with normal CK and CK-MB fraction. TSH came back normal at 4.09. CBC showed white count of 9000, hemoglobin 11.9, hematocrit 36, platelet count 278. Prothrombin time 12. INR 0.9. Urinalysis was unremarkable.   ASSESSMENT:  1. Atrial fibrillation with rapid ventricular rate. 2. Chest pain, for further evaluation. This could be precipitated by the rapid heartbeat inducing demand ischemia.  3. Systemic hypertension.  4. Coronary artery disease, status post coronary artery bypass grafting.  5. Diabetes mellitus type 2. 6. Mild hypomagnesemia.  7. Gastroesophageal reflux disease.  8.  Hyperlipidemia.  9. Chronic anxiety.   PLAN: Will admit to the intensive care unit. IV diltiazem drip started. Follow up on troponin. Resume medications as listed above. Consult Dr. Lady Gary, who is familiar with her, and he just saw her yesterday for followup. According to previous records, anticoagulation was discussed in the past, but she was not a candidate for it because of her lack of compliance and lack of followup, and there also appears to be educational issue and economics. Will give magnesium supplementation to correct the mild hypomagnesemia. For deep vein thrombosis prophylaxis, will place her on heparin subcutaneous 5000 twice a day.   CODE STATUS: The patient indicates that she does not have a living will, but she is full code after much explanation.   Time Spent in evaluating this patient took more than 1 hour.   ____________________________ Carney Corners. Rudene Re, MD amd:OSi D: 07/05/2012 05:16:54 ET T: 07/05/2012 08:18:05 ET JOB#: [id number]  cc: Carney Corners. Rudene Re, MD, <Dictator> Karolee Ohs Dala Dock MD ELECTRONICALLY SIGNED 08/03/2012 23:30

## 2014-07-02 NOTE — Discharge Summary (Signed)
PATIENT NAME:  Robin [name], Robin [name] MR#:  [id number] DATE OF BIRTH:  [birth year]  DATE OF ADMISSION:  09/25/2012 DATE OF DISCHARGE:  09/26/2012  PRIMARY CARE PHYSICIAN:  Dr. Darreld Mclean.   FINAL DIAGNOSES: 1.  Epigastric pain, likely gastroesophageal reflux disease.  2.  Malignant hypertension.  3.  Coronary artery disease.  4.  Atrial fibrillation.  5.  Diabetes.   MEDICATIONS ON DISCHARGE:  Nitrostat 0.4 mg 1 tablet sublingual every five minutes as needed for chest pain, hydralazine 25 mg twice a day, amiodarone 200 mg daily, enalapril 10 mg twice a day, glipizide extended release 10 mg daily, amlodipine 10 mg daily, hydrocortisone topical one application topically 3 times a day as needed for itching, Carafate 10 mL 4 times a day before meals and at bedtime, Protonix 40 mg twice a day.  I did stop aspirin and Plavix secondary to the GI symptoms.   DIET:  Low-sodium diet, small meals throughout the day, regular consistency.   ACTIVITY:  As tolerated.    FOLLOW-UP:  The patient was given the name and number of Dr. Marva Panda, gastroenterology, to call for appointment for hospital follow-up.    HOSPITAL COURSE:  The patient was admitted as an observation on 09/25/2012.  Came in with headache and elevated blood pressure.  She was admitted with hypertensive urgency, malignant hypertension.  Laboratory and radiological data during the hospital course included a urinalysis negative.  Troponin negative.  White blood cell count 9.5, H and H 12.0 and 36.8, platelet count of 276.  Glucose 109, BUN 15, creatinine 0.67, sodium 143, potassium 3.8, chloride 109, CO2 29, calcium 9.0.  Liver function tests normal range.  CT scan of the head without contrast showed no acute intracranial abnormality.  Chest x-ray showed no focal pneumonia, possible heart failure.  Urine culture, no growth 8 to 12 hours.  Hemoglobin A1c 7.7, LDL 166, HDL 55, triglycerides 166.  Repeat hemoglobin was 12.   HOSPITAL COURSE PER PROBLEM  LIST:  1.  For the patient's epigastric pain, likely gastroesophageal reflux disease, every time she ate she has had pain in the epigastric area radiating up into the chest, into the arms, likely gastroesophageal reflux in nature, since the patient is on aspirin and Plavix I did hold that.  Hemoglobin is stable and the patient is hemodynamically stable.  We will refer to Dr. Marva Panda as outpatient.  I did increase her Protonix to twice a day and added Carafate.  The patient was hesitant on endoscopy, but if she has more issue she will call and set up an endoscopy.  I do not believe that this is her gallbladder with her liver function tests being normal.  I did have the radiologist look back at her prior CT scan of the abdomen.  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  2.  For her malignant hypertension, she was restarted on her usual medications.  Blood pressure is very variable based on her mood.  She is very anxious and also based on her pain I did have a blood pressure as good as 145/60 on her usual medications.  3.  History of coronary artery disease.  We will have to hold the aspirin and Plavix at this time with the epigastric pain.  Troponins were negative.  4.  For the patient's paroxysmal atrial fibrillation, she is on amiodarone.  She was not deemed a good candidate for  chronic anticoagulation secondary to noncompliance.  5.  Diabetes.  She is on glipizide.   Time spent on discharge 35 minutes.    ____________________________ Herschell Dimes. Renae Gloss, MD rjw:ea D: 09/26/2012 16:50:07 ET T: 09/26/2012 17:09:50 ET JOB#: [id number]  cc: Herschell Dimes. Renae Gloss, MD, <Dictator> Leanna Sato, MD Christena Deem, MD  Salley Scarlet MD ELECTRONICALLY SIGNED 09/26/2012 17:21

## 2014-07-02 NOTE — Consult Note (Signed)
[name] NAME:  Robin [name] [name], Robin [name] [name] MR#:  [id number] DATE OF BIRTH:  09-07-1940  DATE OF CONSULTATION:  11/28/2012  CONSULTING PHYSICIAN:  Nikolus Marczak A. Allena Katz, MD  PRIMARY CARE PHYSICIAN:  Dr. Darreld Mclean   CARDIOLOGIST: Dr. Lady Gary.   CHIEF COMPLAINT: Chest pain, headache and constipation.   HISTORY OF PRESENT ILLNESS:  Robin [name] [name] is a 74 year old Caucasian female with past medical history of hypertension, coronary artery disease, status post cardiac stents in [name] remote past, history of paroxysmal AFib. She comes in to [name] with complaints of headache and some chest discomfort. She also reports during my evaluation, is more focused on her being constipated and how she took milk of magnesia and already has had 2 bowel movements and now that she is feeling overall better. She does have history of GERD and takes PPI b.i.d.; however, she states she wakes up in [name] morning still feeling burning in her stomach. She did not take her morning meds. Her blood pressure systolic was in [name] 200s. She received her amlodipine, enalapril and hydralazine while in [name]. Her EKG shows normal sinus rhythm with bifascicular block similar to EKG in July 2014. No acute ST elevation or depression were noted. Her first set of cardiac enzymes are negative.   PAST MEDICAL HISTORY: 1.  Coronary artery disease, status post stent and CABG in [name] past.  2.  Paroxysmal atrial fibrillation, now in sinus rhythm with occasional PACs.  3.  Hypertension.  4.  Type 2 diabetes.  5.  GERD.  6.  Hyperlipidemia.  7.  Chronic anxiety disorder.  8.  Constipation.   PAST SURGICAL HISTORY:  C-section, tonsillectomy and CABG.   FAMILY HISTORY: Positive for family members with CAD and mother had congestive heart failure.   ALLERGIES: ASPIRIN, PLAVIX, PERCOCET.   SOCIAL HISTORY: She lives by herself does not smoke. Does not drink.   CURRENT MEDICATIONS: 1.  Protonix 40 mg b.i.d.  2.  Nitrostat 0.4 mg sublingual as  needed.  3.  Hydralazine 25 mg b.i.d.  4.  Glipizide 10 mg daily.  5.  Enalapril 10 mg b.i.d.  6.  Ativan 0.5 mg 3 times a day as needed.  7.  Amlodipine 10 mg daily.  8.  Amiodarone 200 mg once a day.   REVIEW OF SYSTEMS: CONSTITUTIONAL: No fever, fatigue, weakness.  EYES: No blurred or double vision. No cataracts.  ENT: No tinnitus, ear pain, hearing loss.  RESPIRATORY: No cough, wheeze, hemoptysis or COPD.  CARDIOVASCULAR: No chest pain. [name] [name] had chest pain earlier, however, negative now. Positive for hypertension. No arrhythmia or dyspnea on exertion.  GASTROINTESTINAL: Positive for GERD. No nausea, vomiting, diarrhea, abdominal pain. Positive for constipation.  GENITOURINARY: No dysuria, hematuria or frequency.  ENDOCRINE: No polyuria, nocturia or thyroid problems.  HEMATOLOGY: No anemia or easy bruising.  SKIN: No acne or rash.  MUSCULOSKELETAL: Positive for arthritis. No swelling or gout.  NEUROLOGIC: No CVA, TIA or ataxia.  PSYCHIATRIC: No anxiety or depression. All other systems reviewed and negative.   PHYSICAL EXAMINATION: GENERAL: Robin [name] [name] is awake, alert, oriented x 3, not in acute distress.  VITAL SIGNS: She is afebrile. Pulse is 58, respirations 18, blood pressure 209/60, sats 100% on room air.  HEENT: Atraumatic, normocephalic. PERRLA. EOM intact. Oral mucosa is moist.  NECK: Supple. No JVD. No carotid bruit.  RESPIRATORY: Clear to auscultation bilaterally. No rales, rhonchi, respiratory distress or labored breathing.  CARDIOVASCULAR:  Both heart sounds are normal. Rate, rhythm regular. PMI  not lateralized. Chest is nontender. Good pedal pulses, good femoral pulses. No lower extremity edema.  ABDOMEN: Soft, benign, nontender. No organomegaly. Positive bowel sounds.  NEUROLOGIC: Grossly intact cranial nerves II through XII. No motor or sensory deficit.  PSYCHIATRIC: Robin [name] [name] is awake, alert, oriented x 3.   EKG shows normal sinus rhythm with right bundle  branch block and left anterior fascicular block similar to one done on 09/25/2012.   DIAGNOSTIC DATA: Cardiac enzymes, first set negative. CBC within normal limits. Comprehensive metabolic panel within normal limits. Chest x-ray: No acute cardiopulmonary abnormality.   IMPRESSION:  A 74 year old Robin [name] [name] with history of coronary artery disease, hypertension and type 2 diabetes. Comes in with:  1.  Chest pain with gastroesophageal reflux disease and elevated blood pressure. [name] [name]'s chest pain, resolved after she had a gastrointestinal cocktail and she had a bowel movement she felt overall much better.  No acute changes on EKG. Troponin negative. [name] [name] is intolerant to aspirin and Plavix. She has had a few admissions with similar symptoms. Work-up negative. Robin [name] [name] was unable to complete a Myoview stress test in [name] past. Echo shows ejection fraction of 55%. [name] above was discussed with Dr. Darrold Junker.  Okay to go home since [name] [name] is better and follow up outpatient with Dr. Lady Gary.  2.  Gastroesophageal reflux disease. Continue proton pump inhibitor b.i.d. Over-Rob[facility] [name]-counter Pepcid AC at bedtime was recommended.  3.  Hypertension. accelerated. [name] [name] did not take her medications in [name] morning. Improving now that her blood pressure medications have been given.  4.  History of coronary artery disease. Continue diet control along with nitroglycerin and ACE inhibitors and diabetes control and follow up with Dr. Lady Gary.  5.  Type 2 diabetes, on glipizide.  6.  Hyperlipidemia. Continue statins.  7.  Constipation. Robin [name] [name] feels better after having milk of magnesium and had a bowel movement in [name]. Overall improved. We will discharge [name] [name] to home. She is recommended to come back to [name] if needed, else keep appointment with Dr. Darreld Mclean and Dr. Lady Gary.   TIME SPENT: 55 minutes.     ____________________________ Wylie Hail Allena Katz,  MD sap:dp D: 11/28/2012 12:45:29 ET T: 11/28/2012 13:04:53 ET JOB#: [id number]  cc: Denver Harder A. Allena Katz, MD, <Dictator> Leanna Sato, MD Darlin Priestly. Lady Gary, MD Willow Ora MD ELECTRONICALLY SIGNED 11/28/2012 15:03

## 2014-07-02 NOTE — Consult Note (Signed)
   General Aspect 74 yo female with history of cad s/p cabg, history of afib converted to nsr with amiodarone as well as hypertension and anxiety. She was seen in our office yesterday. She is poorly compliant with her meds due to concern that they are too strong for her. She has not taken her amiodarone. She was admttd with afib with rvr. This was improved with iv cardizem. She is very resistant to taking her meds at home and in the hospital. She has not been felt to be a cnadiate for chronic anticoagulation due to non compliance as out aptient.   Physical Exam:  GEN no acute distress   HEENT PERRL, hearing intact to voice   RESP clear BS   CARD Irregular rate and rhythm  Tachycardic  Murmur   Murmur Systolic   Systolic Murmur Out flow   ABD denies tenderness  no hernia  no Abdominal Bruits   LYMPH negative neck   EXTR negative cyanosis/clubbing   SKIN normal to palpation   NEURO cranial nerves intact, motor/sensory function intact   PSYCH A+O to time, place, person   Review of Systems:  Subjective/Chief Complaint weakness and anxiety   General: Fatigue  Weakness   Skin: No Complaints   ENT: No Complaints   Eyes: No Complaints   Respiratory: No Complaints   Cardiovascular: No Complaints   Gastrointestinal: No Complaints   Genitourinary: No Complaints   Vascular: No Complaints   Musculoskeletal: No Complaints   Neurologic: No Complaints   Hematologic: No Complaints   Endocrine: No Complaints   Psychiatric: No Complaints   Review of Systems: All other systems were reviewed and found to be negative   Medications/Allergies Reviewed Medications/Allergies reviewed   EKG:  Interpretation afib with rvr    Percocet: Alt Ment Status  Percocet 10/325: Other   Impression Pt with history of cad s/p cabg now with intermittant afib with rvr. She has been admitted in the past with this and has converted to nsr with amiodarone. She has been resistant to be  compliant with her meds thinking they are too strong for her. SHe was admitted with afib with rvr. Rate has been improved with cardizem drip. She is also on heparin. She is not a cnadidate for outpatinet anticoagulaton other than plavix due to compliance issues.   Plan 1. Resume amiodarone to 200 mg daily 2. Wean off of cardizem drip 3. Conitnue with heparin during hospitalizaiton 4. Transfer to telemetry 5. Contiue with hydralazine for blood pressure 6. Wil discotinue metoprolol 7. Will follw with you.   Electronic Signatures: Dalia Heading (MD)  (Signed 26-Apr-14 17:35)  Authored: General Aspect/Present Illness, History and Physical Exam, Review of System, EKG , Allergies, Impression/Plan   Last Updated: 26-Apr-14 17:35 by Dalia Heading (MD)

## 2014-07-02 NOTE — H&P (Signed)
PATIENT NAME:  Robin, [name] MR#:  [id number] DATE OF BIRTH:  June 18, 1940  DATE OF ADMISSION:  07/23/2012  REFERRING PHYSICIAN: Dr. Chiquita Loth.   PRIMARY CARE PHYSICIAN: Dr. Darreld Mclean.   CHIEF COMPLAINT: Chest pain.   HISTORY OF PRESENT ILLNESS: This is a 74 year old female with significant past medical history of hypertension, recurrent Afib, coronary artery disease status post CABG, diabetes mellitus, hyperlipidemia, chronic anxiety, poor compliance with medication. The patient presents with complaints of chest pain. The patient reports she had chest discomfort of gradual onset. Woke her up from sleep. Described it as waxing and waning, intermittent in quality, nonradiating. She described it like indigestion. As well, reports some shortness of breath with it but denies any nausea, vomiting, sweating, palpitations or cough. No relieving and no provoking factors. The patient's first troponin was negative, but the patient had EKG done which did show new T wave inversion in lead V2 when compared to last EKG. The patient presented twice in this year with complaints of similar presentation, once in January and once in April where she was found to be in Afib with RVR which was thought to be most likely due to her noncompliance. Out of concern of unstable angina, the patient received 324 of aspirin in the ED and subcutaneous Lovenox 1 mg/kg. The hospitalist service was requested to admit the patient for further workup of her chest pain.   PAST MEDICAL HISTORY:  1. Paroxysmal Afib.   2. History of coronary artery disease, status post CABG.  3. Hypertension.  4. Diabetes mellitus.  5. Gastroesophageal reflux disease.  6. Hyperlipidemia.  7. Chronic anxiety.   PAST SURGICAL HISTORY:  1. CABG.  2. C-section.  3. Tonsillectomy.   FAMILY HISTORY:  1. Father died at the age of 47 during open heart surgery.  2. Mother died in her 30s. She suffered from hypertension and heart disease.   SOCIAL  HISTORY: The patient is a widow. Lives at home. She does not smoke or drink alcohol or have any history of illicit drug use.   ALLERGIES: The patient has allergy to PERCOCET.   HOME MEDICATIONS:  1. Aspirin 81 mg daily.  2. Enalapril 10 mg daily.  3. Sublingual nitroglycerin as needed.  4. Amiodarone 200 mg oral daily.  5. Glipizide 10 mg extended release daily.  6. Plavix 75 mg daily.  7. Hydralazine 25 mg p.o. b.i.d.   REVIEW OF SYSTEMS:  CONSTITUTIONAL: The patient denies fever, chills, fatigue, weakness.  EYES: Denies blurry vision, double vision, pain, inflammation.  ENT: Denies tinnitus, ear pain, hearing loss.  RESPIRATORY: Denies cough, wheezing, hemoptysis. Has occasional shortness of breath.  CARDIOVASCULAR: Has complaints of chest discomfort. Denies orthopnea, edema, palpitations, syncope.  GASTROINTESTINAL: Denies nausea, vomiting, diarrhea, abdominal pain, hematemesis, melena, GERD, jaundice, rectal bleed, bright red blood per rectum.  GENITOURINARY: Denies dysuria, hematuria, renal colic.  ENDOCRINE: Denies polyuria, polydipsia, heat or cold intolerance.  HEMATOLOGY: Denies anemia, easy bruising, bleeding diathesis.  INTEGUMENT: Denies acne, rash or skin lesions.  MUSCULOSKELETAL: Denies any swelling, gout, limited activity or cramps.  NEUROLOGIC: Denies CVA, TIA, seizures, memory loss, headache, dementia, ataxia, vertigo.  PSYCHIATRIC: Has a history of chronic anxiety. Denies substance abuse, alcohol abuse, schizophrenia or insomnia.   PHYSICAL EXAM:  VITAL SIGNS: Temperature 97.8, pulse 62, respiratory rate 18, blood pressure 178/69, saturating 97% on room air.  GENERAL: Elderly female, looks comfortable in bed, in no apparent distress, but she appears to be anxious.  HEENT: Head is atraumatic, normocephalic. Pupils  equal, reactive to light. Pink conjunctivae. Anicteric sclerae. Moist oral mucosa.  NECK: Supple. No thyromegaly. No JVD.  CHEST: Good air entry  bilaterally. No wheezing, rales, rhonchi.  CARDIOVASCULAR: S1, S2 heard. No rubs, murmurs, gallops.  ABDOMEN: Soft, nontender, nondistended. Bowel sounds present.  EXTREMITIES: No edema. No clubbing. No cyanosis.  PSYCHIATRIC: Appropriate affect. Awake, alert x3. Intact judgment and insight. Appears to be anxious.  NEUROLOGIC: Cranial nerves grossly intact. Motor 5 out of 5.   PERTINENT LABS: Glucose 107, BUN 19, creatinine 0.71, sodium 143, potassium 4, chloride 109, CO2 29. Troponin less than 0.02. White blood cell 8.5, hemoglobin 11.3, hematocrit 34.5, platelets 236. Urinalysis showing +3 white blood cells and +1 leukocyte esterase and trace bacteria in the urine.   EKG showing normal sinus rhythm with new T wave inversion in V2, with ventricular rate of 63.   ASSESSMENT AND PLAN:  1. Chest pain: This is the patient's third presentation with chest pain this year. Initial 2 due to atrial fibrillation with rapid ventricular response, but this one the patient is in normal sinus rhythm with controlled rate. The patient's first troponin is negative, but EKG does show new T wave inversion in V2, so she received 324 of aspirin in ED and Lovenox treatment dose out of concern of unstable angina. The patient will be admitted to telemetry unit with telemetry monitoring. Will continue to cycle her cardiac enzymes. Will consult cardiology service to see if there is any need for a stress test to be done, especially given the fact of the patient's high level of anxiety and multiple presentations with similar complaints.  2. Urinary tract infection: Will continue the patient on Rocephin.  3. Coronary artery disease: Continue aspirin and Plavix.  4. Hypertension: Blood pressure is uncontrolled. Will continue her on her home medication. As well, we will add her on p.r.n. intravenous hydralazine pushes.  5. Diabetes mellitus: Will hold oral hypoglycemic agents. Will have her on insulin sliding scale.  6.  Gastroesophageal reflux disease: Will have her on proton pump inhibitor.  7. Atrial fibrillation: Currently in normal sinus rhythm. Continue with amiodarone for rate control. No chemical anticoagulation secondary to her poor compliance as determined by cardiology in the past.  8. Deep vein thrombosis prophylaxis: The patient received 1 dose treatment dose of subcutaneous Lovenox. Will hold on further anticoagulation for the time being until seen by cardiology in a.m.   CODE STATUS: FULL CODE.   TIME SPENT ON ADMISSION AND PATIENT CARE: 55 minutes.   ____________________________ Starleen Arms, MD dse:gb D: 07/23/2012 03:42:54 ET T: 07/23/2012 04:30:14 ET JOB#: [id number]  cc: Starleen Arms, MD, <Dictator> Jerilee Space Teena Irani MD ELECTRONICALLY SIGNED 07/31/2012 0:28

## 2014-07-02 NOTE — Discharge Summary (Signed)
PATIENT NAME:  Robin [name], Robin [name] MR#:  [id number] DATE OF BIRTH:  June 13, 1940  DATE OF ADMISSION:  03/17/2012 DATE OF DISCHARGE:  03/19/2012  REASON FOR ADMISSION: Atrial fibrillation with rapid ventricular response, chest pain.   DISCHARGE DIAGNOSES:  1.  Atrial fibrillation with rapid ventricular response and multiple deep vein thromboses and short runs of ventricular tachycardia.  2.  Coronary artery disease.  3.  Diabetes.  4.  Hyperlipidemia.  5.  Hypertension.  6.  Gastroesophageal reflux.  7.  Hyperlipidemia with LDL of 103 and low HDL of 37.  8.  Uncontrolled diabetes with a hemoglobin A1c of 8.3.  9.  Noncompliance with medications.  10.  Anxiety.   DISPOSITION: Home.   Followup: Dr. Harold Hedge in the next 7 to 14 days.   MEDICATIONS AT DISCHARGE: An 81 mg aspirin once daily, Plavix 75 mg once daily, enalapril 10 mg twice daily, Nitrostat p.r.n. chest pain, hydralazine 25 mg twice daily, metoprolol 50 mg twice daily, amiodarone 400 mg once daily,guaifenecin dm  5 mL every four hours p.r.n. cough, glipizide 10 mg extended-release once daily.  Albuterol has been discontinued since the patient gets nauseous whenever she takes the medication. The patient has a nebulizer for what she has a prescription of albuterol Atrovent nebulizers.   HOSPITAL COURSE: The patient is a very nice 74 year old female who was admitted on March 17, 2012, with a chief complaint of chest pain. Whenever she was admitted, she was noticed to be tachycardic with a heart rate of 160. EKG showed atrial fibrillation with RVR. The patient was given two boluses of Cardizem one soon after the other with some improvement of her heart rate but decreased blood pressure in the 170/58 and one time down to 80/61. Her heart rate improved to 100s and then spiked up again to the 150s, 160s. The patient was admitted to the Critical Care Unit to be treated for this condition. The patient also had multiple duplets and triplets and  short runs of V. tach during the time that she was in the ER seen on the monitor, for which magnesium was given and the patient was started on amiodarone.  1.  Atrial fibrillation with rapid ventricular response and occasional aberrant atrial fibrillation versus ventricular tachycardia. The patient is treated with amiodarone.  Amiodarone was effective reversing her rhythm. She was normal sinus rhythm by the next morning. The patient was put on oral amiodarone and 400 mg and discharged to have followup with Dr. Lady Gary within the next seven days. The patient is to continue her beta blocker and all her medications for coronary artery disease.  2.  Coronary artery disease: The patient is on Plavix and aspirin.  3.  Anticoagulation: The patient apparently is not very compliant.  Dr. Lady Gary was concerned about putting her on Coumadin because of this. She does certainly meet  criteria based and [place] score for being anticoagulated but the patient is now in normal sinus rhythm. Dr. Lady Gary is going to see her in the office to make the determination if she will benefit from Xarelto since the patient might not be able to afford it.  For now, she is going to be discharged on baby aspirin and Plavix.  4.  Diabetes: The patient has uncontrolled diabetes due to noncompliance. She stated that she was not taking her medications because her pills were really big, for what we gave her medications include glipizide XL, where the pills are a little bit smaller. The patient needs to follow  up with her primary care physician about this.  5. Hypertension: The patient has uncontrolled hypertension. Continue metoprolol, hydralazine and now she is on amiodarone.  6.  Other medical problems were stable during this hospitalization. As far as important results: Electrolytes were within normal limits. Her magnesium was 2.2. Her calcium was 7.7, but with low albumin of 3. Her cardiac enzymes were negative. Her TSH was 3.2. White blood cells of  11,000 and 6.2 at discharge. Hemoglobin is 11.3.    Her echocardiogram was ordered but the results are not dictated.  As per Dr. Harold Hedge, her ejection fraction was fine.   The patient discharged in good condition.  I spent about 40 minutes with this discharge.    ____________________________ Felipa Furnace, MD rsg:th D: 03/20/2012 16:28:55 ET T: 03/20/2012 22:49:08 ET JOB#: [id number]  cc: Felipa Furnace, MD, <Dictator> Nico Rogness Juanda Chance MD ELECTRONICALLY SIGNED 03/21/2012 13:48

## 2014-07-02 NOTE — H&P (Signed)
PATIENT NAME:  Robin [name], Robin [name] MR#:  [id number] DATE OF BIRTH:  [birth year]  DATE OF ADMISSION:  09/25/2012  PRIMARY CARE PHYSICIAN: Dr. Delight Stare.   REFERRING PHYSICIAN: Dr. Ponciano Ort.  CHIEF COMPLAINT: Headache with elevation of blood pressure.   HISTORY OF PRESENT ILLNESS: This is a very nice 74 year old female, who I have met on previous hospitalizations. The patient has history of paroxysmal atrial fibrillation, coronary artery disease, status post CABG, hypertension and diabetes. She comes occasionally with medical problems related to that problems mentioned above and noncompliance with taking her medications. The patient comes today with a history of having a headache that started today. She felt dizzy and almost fainted this afternoon and she decided to come to the ER. On admission to the ER, her blood pressures are 229/92. After talking to the patient for a while, the patient tells me that she has not been taking her medications. She says that occasionally she takes one, but another day she will take a different one. She does not have any real compliance or follow through with her medications. We had a long discussion about the importance to take her medications and talking about that most of her hospitalizations could be preventable by the patient just taking her medications the way she should. She states that she just does not know why she is on that many medications and that she has doubts that they are working. After being confronted, we decided that the patient's medications might work if she takes them and she actually agreed with that assessment. The patient states that she can afford her medications, so there is problem with that. It is just a matter of her own personality. She is very anxious and at this moment she just wants to make sure everything is going to be all right. The patient states that she was having some chest pain, whenever she showed up to the ER. Nitroglycerin was  given and the chest pain went away. She does have history of stable angina. She has chest tightness that has been chronic since her CABG that happened every time she does major activities, whenever she is cleaning the house or moping the floors she has some chest tightness that goes away after she rests. Again, this is chronic and her doctor knows about that. The patient states that whenever she has a tightness, it is just mild, 2 to 3 out of 10 and it just feels pressure-like, no radiation. No nausea, vomiting, lightheadedness, or sweating associated to it and it is brought on by exercise and resolves with resting. The patient has been given several medications here in the ER, including nitroglycerin, including her home amiodarone, hydralazine and her blood pressure is still really high. Now, we are going to try to give her blood pressure medications orally and keep her here as an observation.   REVIEW OF SYSTEMS: A 12 system review of systems is done. CONSTITUTIONAL: The patient denies any fever, fatigue, weakness, weight loss or weight gain. EYES: Blurry vision, double vision or inflammation is negative. EARS, NOSE, THROAT: No tinnitus. No difficulty swallowing. No postnasal drip. RESPIRATORY: No cough, wheezing, hemoptysis, dyspnea, painful respiration or COPD. CARDIOVASCULAR: No orthopnea. Positive chest pain. They are related to her chronic exertional angina as mentioned above in HPI. No orthopnea, no edema, no arrhythmias, no palpitations, no syncope. GASTROINTESTINAL: No nausea, vomiting, abdominal pain, constipation, diarrhea or melena. GENITOURINARY: No dysuria. Positive increased frequency. No hematuria. No kidney stones. GYNECOLOGIC: No breast masses. No vaginal discharge.  ENDOCRINE: No polyuria, polydipsia, polyphagia, cold or heat intolerance. HEMATOLOGIC AND LYMPHATIC: No anemia, easy bruising or bleeding. SKIN: No rashes or petechiae. No moles. MUSCULOSKELETAL: No significant neck pain, back pain  or gout.  NEUROLOGIC: No numbness, tingling. No CVAs or TIAs. PSYCHIATRIC: Positive for chronic anxiety.   PAST MEDICAL HISTORY:  1.  Paroxysmal atrial fibrillation, now normal sinus rhythm with occasional PACs.  2.  Coronary artery disease, status post CABG.  3.  Hypertension.  4.  Diabetes, type 2, non-insulin-dependent.  5.  Gastroesophageal reflux.  6.  Hyperlipidemia.  7.  Chronic anxiety disorder.   PAST SURGICAL HISTORY: C-section, tonsillectomy and coronary artery bypass graft.   FAMILY HISTORY: Positive for multiple members of her family with coronary artery disease. Her father died at the age of 61 during open heart surgery. Her mother had congestive heart failure and significant hypertension.   ALLERGIES: The patient is allergic to PERCOCET.   SOCIAL HISTORY: The patient is a widow. She lives by herself. Does not smoke, does not drink. The patient was recently [place] in [place] visiting some family.   CURRENT MEDICATIONS: Include Plavix 75 mg once a day, Protonix 40 mg once a day, Nitrostat p.r.n. chest pain, hydralazine 25 mg twice daily, glipizide 10 mg once a day, enalapril 10 mg twice daily, aspirin 81 mg once daily, amlodipine 10 mg once daily and amiodarone 200 mg once a day.   PHYSICAL EXAMINATION:  VITAL SIGNS: Blood pressure 229/92, pulse 69, respirations 22, temperature 97.6.  GENERAL: The patient is alert, oriented x 3. Comfortable sitting on bed. No acute distress. No respiratory distress. Hemodynamically the patient is stable.  HEENT: Her pupils are equal and reactive. Extraocular movements are intact. Mucosa is moist. Anicteric sclera. Pink conjunctivae. No oral lesions. No oropharyngeal exudates. No thrush.  NECK: Supple. No JVD. No thyromegaly. No adenopathy. No carotid bruits. No rigidity.  CARDIOVASCULAR: Regular rate and rhythm with occasional PACs. No tenderness to palpation of anterior chest wall. No displacement of PMI.  LUNGS: Clear without any  wheezing or crepitus. No use of accessory muscles.  ABDOMEN: Soft, nontender, nondistended. No hepatosplenomegaly. No masses. Bowel sounds are positive. No suprapubic tenderness.  GENITAL: Deferred.  EXTREMITIES: No significant edema, cyanosis or clubbing. Pulses +2. Capillary refill less than 3.  NEUROLOGIC: Cranial nerves II through XII intact. Strength is 5 out of 5 in all 4 extremities. No focal findings.  PSYCHIATRIC: The patient has mild anxiety, but not agitation. She is alert, oriented x 3 and seems to be on normal judgment. No significant hallucinations or other psychiatric problems.  LYMPHATIC: Negative for lymphadenopathy in neck or supraclavicular areas.  SKIN: Dry skin. No significant rashes or petechiae.  MUSCULOSKELETAL: No significant joint abnormalities or joint effusions.   LABORATORY RESULTS: Glucose 109, BUN 15, creatinine 0.627, potassium 3.8, sodium 143. Other electrolytes within normal limits. Total protein 7.4. AST 18, alkaline phosphatase 114. Troponin is negative x 1. White count of 9.5, hemoglobin 12, platelet count 276. Urinalysis shows trace leukocyte esterase, negative for red blood cells, negative white blood cells. EKG: The patient has normal sinus rhythm, no ST depression or elevation. She has right bundle branch block and left axis. Correlate with bifascicular block. Chest x-ray shows mostly chronic findings, globular heart, cardiomegaly, which is stable. No significant infiltrates. No significant signs of pulmonary edema. CT of the head for evaluation of headache, no acute abnormality.   ASSESSMENT AND PLAN: This is a nice 74 year old female with history of noncompliance, paroxysmal  atrial fibrillation, coronary artery disease, hypertension, diabetes, gastroesophageal reflux, hyperlipidemia, who comes with severe headache and very high blood pressures.  1.  Hypertension urgency/accelerated hypertension. The patient has blood pressures above 200s/100s. The patient has  not been taking her medications the way that she should, so this is likely brought on by noncompliance. We are going to try to rule out any other problems. Troponins have been ordered to be scheduled every 8 hours. Furthermore, it seems like the patient just has not been taking her medications the right away as she should. Long conversation with the patient as far as this issue. The patient states that she just does not think that her medications are not going to work, but she recognizes she has not given them the opportunity to work because she has not been taking it from day one. She occasionally takes one and another day takes another one, just to experiment. Education has been given to the patient and re-enforced the issue that she needs to trust her primary care physician with her medication management. The patient is going to be admitted as an observation to control her blood pressure. For now, we are going to restart her on her home medications that include amlodipine, enalapril, hydralazine and we are going to also added on hydralazine and Vasotec IV. No beta blocker as her heart rates are in the 60s.  2.  As far as her atrial fibrillation with paroxysmal atrial fibrillation and history of rapid ventricular response, her heart rate is stable. We are going to restart the amiodarone that the patient has not been taking.  3.  As far as her diabetes, the patient states that her diabetes has been well controlled. Her glucose is only 109. She takes glipizide. We are going to put her on insulin sliding scale and monitor closely.  4.  As far as her hyperlipidemia, will continue statins.  5.  Gastroesophageal reflux disease. Continue Protonix.  6.  Other medical problems seem to be stable.   CODE STATUS: Full code. The patient is doing otherwise okay.   TIME SPENT: About 45 minutes on this admission.   ____________________________ Ridgeway Sink, MD rsg:aw D: 09/25/2012 13:59:27  ET T: 09/25/2012 14:25:32 ET JOB#: [id number]  cc:  Sink, MD, <Dictator> [place] MD ELECTRONICALLY SIGNED 09/25/2012 20:31

## 2014-07-02 NOTE — Consult Note (Signed)
General Aspect patient is a 74 year old female with history of coronary artery disease status post coronary artery bypass grafting including a left internal mammary to the LAD who was admitted with chest pain and was noted to have atrial fibrillation with rapid ventricular response. Marland Kitchen  She was placed on an amiodarone drip.  She also has a history of diabetes, hyperlipidemia hypertension.  She states he was both typical of her gastroesophageal reflux disease as well as her angina.  She has had afib with rvr in the past converted with cardizem. She was not felt to be an ideal candidate for chronic anticoagulation at that time and was treated with asa and plavix. She has ruled out for an mi and remains in nsr on amiodarone drip. She feels back to her baseline.   Physical Exam:   GEN no acute distress, obese    HEENT PERRL, hearing intact to voice, poor dentition    NECK supple    RESP normal resp effort  clear BS    CARD Regular rate and rhythm  No murmur  initial ecg revealed afib with rvr    ABD denies tenderness  normal BS  no Abdominal Bruits    LYMPH negative neck    EXTR negative cyanosis/clubbing, negative edema    SKIN normal to palpation    NEURO cranial nerves intact, motor/sensory function intact    PSYCH A+O to time, place, person   Review of Systems:   Subjective/Chief Complaint midsternal chest pain    General: Fatigue    Skin: No Complaints    ENT: No Complaints    Eyes: No Complaints    Neck: No Complaints    Respiratory: No Complaints    Cardiovascular: Chest pain or discomfort  Tightness  Palpitations    Gastrointestinal: No Complaints    Genitourinary: No Complaints    Vascular: No Complaints    Musculoskeletal: No Complaints    Neurologic: No Complaints    Hematologic: No Complaints    Endocrine: No Complaints    Psychiatric: No Complaints    Review of Systems: All other systems were reviewed and found to be negative     Medications/Allergies Reviewed Medications/Allergies reviewed   Home Medications: Medication Instructions Status  albuterol CFC free 90 mcg/inh inhalation aerosol 2 puff(s) inhaled every 4 hours, As Needed- for Wheezing  Active  aspirin 81 mg oral tablet 1 tab(s) orally once a day Active  Plavix 75 mg oral tablet 1 tab(s) orally once a day Active  enalapril 10 mg oral tablet 1 tab(s) orally 2 times a day Active  Nitrostat 0.4 mg sublingual tablet 1 tab(s) sublingual every 5 minutes, As Needed- for Chest Pain  Active  hydrALAZINE 25 mg oral tablet 1 tab(s) orally 2 times a day Active  Zofran ODT 4 mg oral tablet, disintegrating 1 tab(s) orally every 8 hours, As Needed- for Nausea, Vomiting  Active  metoprolol tartrate 50 mg oral tablet 1 tab(s) orally 2 times a day Active   EKG:   Interpretation initial atrial fibrillation with rapid ventricular response.  Now converted to sinus rhythm.  no evidence of injury or ischemia    Percocet: Alt Ment Status    Impression 74 year old female with history of coronary disease status post coronary artery bypass grafting, diabetes, hypertension and hyperlipidemia who was admitted after presenting to the emergency room with complaints of epigastric discomfort.  She was noted to be in atrial fibrillation with rapid ventricular response.  After being placed on amiodarone drip,  she converted to normal sinus rhythm. She has ruled out for an mi. She is stable. Would agree with converting from iv amioadrone to amiodarone 400 mg po daily. Continue asa at present    Plan 1.  Convert to po amiodarone at 200 mg daily and would discharge on this dose.  2. Continue with asa and metoprolol 3. Will further discuss chronic anticoagulation with patient at time of discharge. Pt is somewhat a difficult candidate for warfarin due to compliance with blood draw issues and financial concerns with newer agents. 4.OK to transfer to telemetry 5 Ambualte and follow for 24 hours and  if stable consider dischage in am if stable.   Electronic Signatures: Dalia Heading (MD)  (Signed 07-Jan-14 09:36)  Authored: General Aspect/Present Illness, History and Physical Exam, Review of System, Home Medications, EKG , Allergies, Impression/Plan   Last Updated: 07-Jan-14 09:36 by Dalia Heading (MD)

## 2014-07-03 NOTE — Consult Note (Signed)
   Present Illness 74 yo female with history of cad s/p cabg, history of intermitant afib with rvr who was admitted with recurrent afib with rvr. [place] was recently in [place] and was admitted with afib with rvr. She was placed on dabigatran and amiodarone and metoprolol. She states she did not tolerate metoprolol due ot severe fatigue. She had self discontinued the metoprolol. She was seen in our office yesterday and was in nsr. She states she went home and developed recurrent rapid heart rate wiht chest pain. She presented to the er where she was in afib with rvr. She was given iv cardizem in the er and has converted ot nsr. She has ruled out for mi and remains in nsr.   Physical Exam:  GEN well nourished, no acute distress   HEENT PERRL   NECK supple   RESP normal resp effort   CARD Regular rate and rhythm  Murmur   Murmur Systolic   Systolic Murmur axilla   ABD denies tenderness  normal BS   LYMPH negative neck, negative axillae   EXTR negative cyanosis/clubbing, negative edema   SKIN normal to palpation   NEURO cranial nerves intact   PSYCH A+O to time, place, person, anxious   Review of Systems:  Subjective/Chief Complaint chest pain and sob   General: Fatigue   Skin: No Complaints   ENT: No Complaints   Eyes: No Complaints   Neck: No Complaints   Respiratory: Short of breath   Cardiovascular: Tightness   Gastrointestinal: No Complaints   Genitourinary: No Complaints   Vascular: No Complaints   Musculoskeletal: No Complaints   Neurologic: No Complaints   Hematologic: No Complaints   Endocrine: No Complaints   Psychiatric: No Complaints   Review of Systems: All other systems were reviewed and found to be negative   Medications/Allergies Reviewed Medications/Allergies reviewed   Family & Social History:  Family and Social History:  Family History Non-Contributory   Social History negative tobacco   Place of Living Home   EKG:  EKG  NSR    Percocet: Alt Ment Status  Percocet 10/325: Other   Impression 74 yo female with history of cad s/p cabg with history of paroxysmal afib who was admitted iwth recurrent afib with rvr. Converted to nsr with iv cardizem. Had been placed amiodarone 200 mg daily and on metoprolol 25 bid. She hsa self discontinued metoprolol due ot side effects. Currently in nsr. Will increase amiodarone 400 mg dialy and disconitnue metoprolol and conitnue iwth hydralazine. Would continue with dabigatran.   Plan 1. Discontinue metoprolol 2. Increase amiodarone 400 daily 3. Continue iwth dabigatran 4. Follow for 24 hours and if stable consider discharge in am.   Electronic Signatures: Dalia Heading (MD)  (Signed 03-Dec-15 13:04)  Authored: General Aspect/Present Illness, History and Physical Exam, Review of System, Family & Social History, EKG , Allergies, Impression/Plan   Last Updated: 03-Dec-15 13:04 by Dalia Heading (MD)

## 2014-07-03 NOTE — H&P (Signed)
PATIENT NAME:  Robin [name], Robin [name] MR#:  [id number] DATE OF BIRTH:  [birth year]  DATE OF ADMISSION:  02/10/2014  REFERRING PHYSICIAN: [place] PRIMARY CARE PHYSICIAN: Leanna Sato, MD  CARDIOLOGY: Darlin Priestly. Lady Gary, MD    CHIEF COMPLAINT: Chest pain.   HISTORY OF PRESENT ILLNESS: A 74 year old Caucasian female with history of coronary disease, status post CABG, atrial fibrillation, continuous hypertension, type 2 diabetes non-insulin-requiring, uncomplicated, presenting with chest pain described as acute onset of first palpitations. Describes as a fluttering sensation with associated retrosternal chest pain and dull pressure in quality, 8-10 intensity, radiation to arms bilaterally with associated shortness of breath. Currently, her symptoms have improved. She still is complaining of chest pain, however, intensity is between a 3 and 4 out of 10. On EMS arrival, she was noted to be with a heart rate of  170s, atrial fibrillation. She received Cardizem 25 mg with EMS, 50 mg IV with Emergency Department and started on a Cardizem drip, heart rate is now better controlled, last time 120.   REVIEW OF SYSTEMS:  CONSTITUTIONAL: Denies fevers, chills, fatigue, weakness.  EYES: Denies blurry vision, double vision, eye pain. EARS, NOSE, AND THROAT: Denies tinnitus, ear pain, hearing loss. RESPIRATORY: Positive for shortness of breath as described above. Denies any cough, wheeze.  CARDIOVASCULAR: Positive for chest pain and palpitations as described above. Denies any edema.  GASTROINTESTINAL: Denies nausea, vomiting, diarrhea or abdominal pain. GASTROINTESTINAL: Denies dysuria or hematuria. ENDOCRINE: Denies nocturia or thyroid problems. HEMATOLOGIC AND LYMPHATICS: Denies easy bruising, bleeding. SKIN: Denies rashes or lesions.  MUSCULOSKELETAL: Denies pain in neck, back, shoulder, knees, hips or arthritic symptoms.  NEUROLOGIC: Denies paralysis, paresthesias.  PSYCHIATRIC: Denies anxiety or depressive  symptoms.   Otherwise full review of systems performed by me is negative.  PAST MEDICAL HISTORY: Coronary artery disease, status post CABG, hypertension essential, type 2 diabetes non-insulin-requiring uncomplicated, gastroesophageal reflux disease without esophagitis, hyperlipidemia unspecified.   SOCIAL HISTORY: Denies any alcohol, tobacco or drug usage.   FAMILY HISTORY: Positive for coronary artery disease.   ALLERGIES: PERCOCET CAUSING ALTERED MENTAL STATUS.   HOME MEDICATIONS: Include aspirin 81 mg p.o. daily, Nitrostat 0.4 mg p.o. q. 5 minutes as needed for chest pain, Pradaxa unknown dosage, Ativan 0.5 mg p.o. q. 8 hours as needed for anxiety, metoprolol 25 mg p.o. b.i.d., Protonix of unknown dosage, hydralazine 25 mg p.o. b.i.d.   PHYSICAL EXAMINATION:  VITAL SIGNS: Temperature of 97.6, heart rate 113, respirations 23, blood pressure 160/69, saturating 98% on room air. Weight 80.5 kg, BMI of 30.5.  GENERAL: Well-nourished, well-developed Caucasian female currently in no acute distress.  HEAD: Normocephalic, atraumatic.  EYES: Pupils equal, reactive to light. Extraocular muscles intact. No scleral icterus.  MOUTH: Moist mucosal membranes. Dentition poor. No abscess noted.  EARS, NOSE, THROAT: Clear without exudates. No external lesions.  NECK: Supple. No thyromegaly. No nodules. No JVD.  PULMONARY: Clear to auscultation bilaterally without wheezes, rales, rhonchi. No use of accessory muscles. Good respiratory effort. Chest nontender to palpation. CARDIOVASCULAR: S1, S2. Irregular rate, irregular rhythm, tachycardic. No murmurs, rubs, gallops. One plus edema to the left lower extremity to the knee, which is chronic. Pedal pulse 2+ bilaterally. GASTROINTESTINAL: Soft, nontender, nondistended. No masses. Positive bowel sounds. No hepatosplenomegaly. MUSCULOSKELETAL: No swelling, clubbing or edema. Range of motion full in all extremities.  NEUROLOGIC: Cranial nerves II-XII intact. No  gross focal neurological deficit. Sensation intact. Reflexes intact.  SKIN: No ulcerations, lesion, rash or cyanosis. Skin warm and dry, turgor intact. PSYCHIATRIC: Mood and  affect within normal limits. Alert and oriented x 3. Insight and judgment intact.   LABORATORY DATA: EKG performed reveals atrial fibrillation with rapid ventricular response, heart rate of 123. Chest x-ray performed reveals cardiomegaly without any acute changes or any acute cardiopulmonary process. Remainder of laboratory data: Sodium 141, potassium 3.6, chloride 107, bicarbonate 22, BUN 21, creatinine 0.69 glucose 246. Troponin less than 0.02. WBC 19, hemoglobin 12.5, platelets of 296,000.  ASSESSMENT AND PLAN: A 74 year old Caucasian female with history of coronary artery disease status post coronary artery bypass graft, atrial fibrillation which are chronic, hypertension essential, type 2 diabetes non-insulin requiring uncomplicated, presenting with chest pain, found to be in atrial fibrillation with rapid ventricular response. 1.  Atrial fibrillation with rapid ventricular response. Received Cardizem 25 mg IV with EMS, 50 mg IV with Emergency Department and then subsequently started on Cardizem drip. Her rate has improved to less than 120. We will continue Pradaxa for anticoagulation. We will continue on Cardizem drip for now. We will consult cardiology. She does follow with Dr. Lady Gary and she saw Dr. Lady Gary on the day of admission in his office as an outpatient.  2.  Chest pain, likely demand ischemia given atrial fibrillation with rapid response. We will trend cardiac enzymes x 3. Initiate aspirin and statin therapy. Place on telemetry and consult cardiology.  3.  Gastroesophageal reflux disease, proton pump inhibitor therapy.  4.  Coronary artery disease, status post coronary artery bypass graft. Initiate aspirin and statin therapy. Continue with beta blockade.  5.  Type 2 diabetes non-insulin-requiring, uncomplicated. Place on  insulin sliding scale and q. 6 hour Accu-Cheks. 6.  Venous thromboembolism prophylaxis with Pradaxa.  CODE STATUS: The patient a full code.  TIME SPENT: Forty-five minutes critical care.   ____________________________ Cletis Athens. Hower, MD dkh:TT D: 02/10/2014 21:53:13 ET T: 02/10/2014 22:16:02 ET JOB#: [id number]  cc: Cletis Athens. Hower, MD, <Dictator> DAVID Synetta Shadow MD ELECTRONICALLY SIGNED 02/10/2014 23:57

## 2014-07-03 NOTE — Discharge Summary (Signed)
Dates of Admission and Diagnosis:  Date of Admission 10-Feb-2014   Date of Discharge 12-Feb-2014   Admitting Diagnosis Atreal fibrillation with rapid ventricular response   Final Diagnosis Atreal fibrillation with rapid ventricular response Hypertension diabetes Hyperlidemia Coronary artery disease.    Chief Complaint/History of Present Illness A 74 year old Caucasian female with history of coronary disease, status post CABG, atrial fibrillation, continuous hypertension, type 2 diabetes non-insulin-requiring, uncomplicated, presenting with chest pain described as acute onset of first palpitations. Describes as a fluttering sensation with associated retrosternal chest pain and dull pressure in quality, 8-10 intensity, radiation to arms bilaterally with associated shortness of breath. Currently, her symptoms have improved. She still is complaining of chest pain, however, intensity is between a 3 and 4 out of 10. On EMS arrival, she was noted to be with a heart rate of  170s, atrial fibrillation. She received Cardizem 25 mg with EMS, 50 mg IV with Emergency Department and started on a Cardizem drip, heart rate is now better controlled, last time 120.   Allergies:  Percocet: Alt Ment Status  Percocet 10/325: Other  Pertinent Past History:  Pertinent Past History Coronary artery disease, status post CABG, hypertension essential, type 2 diabetes non-insulin-requiring uncomplicated, gastroesophageal reflux disease without esophagitis, hyperlipidemia unspecified.   Hospital Course:  Hospital Course A 74 year old Caucasian female with history of coronary artery disease status post coronary artery bypass graft, atrial fibrillation which are chronic, hypertension essential, type 2 diabetes non-insulin requiring uncomplicated, presenting with chest pain, found to be in atrial fibrillation with rapid ventricular response. 1.  Atrial fibrillation with rapid ventricular response.  Received IV Cardizem  with EMS and in Emergency Department- converted to sinus rhythm. Stopepd taking metoprolol- as she was feeling very weak.   Appreciated cardiology consult- increased dose of amiodarone and cont pradaxa.   no events overnight- feels comfortable.   Added amlodipin for Htn control. 2.  Chest pain, likely demand ischemia given atrial fibrillation with rapid response.   negative cardiac enzymes x 3. aspirin and statin therapy. appreciated consult cardiology.  3.  Gastroesophageal reflux disease, proton pump inhibitor therapy.  4.  Coronary artery disease, status post coronary artery bypass graft.  aspirin and statin therapy. Continue with beta blockade.  5.  Type 2 diabetes non-insulin-requiring, uncomplicated. Place on insulin sliding scale and q. 6 hour Accu-Cheks. 6.  Venous thromboembolism prophylaxis with Pradaxa. 7. hyperlipiemia- atorvastatin.   Condition on Discharge Stable   DISCHARGE INSTRUCTIONS HOME MEDS:  Medication Reconciliation: Patient's Home Medications at Discharge:     Medication Instructions  nitrostat 0.4 mg sublingual tablet  1 tab(s) sublingual every 5 minutes, As Needed- for Chest Pain    hydralazine 25 mg oral tablet  1 tab(s) orally 2 times a day   ativan 0.5 mg oral tablet  1 tab(s) orally every 8 hours, As Needed - for Anxiety, Nervousness   aspirin low dose 81 mg oral delayed release tablet  1 tab(s) orally once a day   protonix   orally    amiodarone 400 mg oral tablet  1 tab(s) orally once a day   dabigatran 150 mg oral capsule  1 cap(s) orally 2 times a day   lisinopril 10 mg oral tablet  1 tab(s) orally once a day   atorvastatin 20 mg oral tablet  1 tab(s) orally once a day (at bedtime)   amlodipine 5 mg oral tablet  1 tab(s) orally once a day    PRESCRIPTIONS: PRINTED AND PLACED ON CHART  STOP TAKING  THE FOLLOWING MEDICATION(S):    metoprolol tartrate 25 mg oral tablet: 1 tab(s) orally 2 times a day  Physician's Instructions:  Diet Low Sodium  Low  Fat, Low Cholesterol  Carbohydrate Controlled (ADA) Diet   Activity Limitations As tolerated   Return to Work Not Applicable   Time frame for Follow Up Appointment 1-2 weeks  Dr. Anson Fret A(Consultant): [facility], [address], [place], [place] [address], [place] [phone]  Electronic Signatures: Altamese Dilling (MD)  (Signed 04-Dec-15 13:25)  Authored: ADMISSION DATE AND DIAGNOSIS, CHIEF COMPLAINT/HPI, Allergies, PERTINENT PAST HISTORY, HOSPITAL COURSE, DISCHARGE INSTRUCTIONS HOME MEDS, PATIENT INSTRUCTIONS, Follow Up Physician   Last Updated: 04-Dec-15 13:25 by Altamese Dilling (MD)

## 2014-07-04 NOTE — H&P (Signed)
PATIENT NAME:  Robin [name], LEIS MR#:  [id number] DATE OF BIRTH:  10-07-1940  DATE OF ADMISSION:  09/10/2011  REFERRING PHYSICIAN: Dr. Dolores Frame  PRIMARY CARE PHYSICIAN: Dr. Darreld Mclean PRIMARY CARDIOLOGIST: Dr. Lady Gary   CHIEF COMPLAINT: "My chest was hurting."  HISTORY OF PRESENT ILLNESS: Patient is a 74 year old female with past medical history as listed below including hypertension, hyperlipidemia, type 2 diabetes mellitus, coronary artery bypass graft status post coronary artery bypass graft and gastroesophageal reflux disease who presents to the Emergency Department with the above-mentioned chief complaint. Patient states she awoke around 2:00 a.m. with  pain in the substernal portion of her chest which was initially nonradiating and described as a pressure-like sensation but also a burning sensation and she thought it acid reflux. Initial pain was 10/10. A short while later, she noted that both of her arms are numb. She had associated diaphoresis with her chest pain but no shortness of breath, dizziness, nausea or vomiting. Chest pain lasted for a good 4 to 5 hours and thereafter she came to the ER for further evaluation. In the ER, she was noted to be in new onset atrial fibrillation with RVR. She received two separate doses of 10 mg IV Cardizem and a short while later became transiently asystolic with loss of consciousness which only lasted a few seconds and upon regaining consciousness patient was in normal sinus rhythm and was asymptomatic. She is currently asymptomatic at the time of my evaluation and she is chest pain free. She denied ever experiencing any heart palpitations. She is currently in normal sinus rhythm and chest pain free. She states she feels "good" at this time. Initial lab work in the ER is nonrevealing other than slightly elevated BUN at 24 and serum glucose of 266 but cardiac enzymes are negative. Otherwise, she is without specific complaints at this time. Hospitalist service were  contacted for further evaluation and for hospital admission.   PAST SURGICAL HISTORY:  1. Coronary artery bypass grafting. 2. Cesarean section.   PAST MEDICAL HISTORY:  1. Hypertension.  2. Hyperlipidemia.  3. Type 2 diabetes mellitus. 4. Coronary artery disease status post coronary artery bypass graft with hospital admission 01/2010 for chest pain at which point she refused a Myoview.  5. Hypertriglyceridemia.  6. Gastroesophageal reflux disease. 7. History of paroxysmal atrial fibrillation.  8. Anxiety.  9. Hospital admission 04/2010 for abdominal pain attributed to bladder spasms secondary to urinary tract infection.   ALLERGIES: Percocet.  HOME MEDICATIONS: 1. Aspirin 81 mg daily.  2. Plavix 75 mg daily. 3. Metoprolol 50 mg p.o. b.i.d.  4. Lipitor 40 mg daily.  5. Glipizide 10 mg p.o. b.i.d.  6. Enalapril 10 mg p.o. 4 times a day.   FAMILY HISTORY: Significant for coronary artery disease, hypertension, diabetes mellitus.  SOCIAL HISTORY: Negative for tobacco, alcohol or illicit drug use.   REVIEW OF SYSTEMS: CONSTITUTIONAL: Denies fevers, chills, recent changes in weight or weakness. Denies any pain at the moment. HEAD/EYES: Denies headache or blurry vision. ENT: Denies tinnitus, earache, nasal discharge, or sore throat. RESPIRATORY: Denies shortness breath, cough, or wheezing. CARDIOVASCULAR: Had some chest pain earlier which has since resolved. Denies heart palpitations or lower extremity edema. GASTROINTESTINAL: Denies nausea, vomiting, diarrhea, constipation, melena, hematochezia, or abdominal pain. GENITOURINARY: Denies dysuria, hematuria. ENDOCRINE: Denies heat or cold intolerance. HEME/LYMPH: Denies easy bruising or bleeding. INTEGUMENTARY: Denies rashes or lesions. MUSCULOSKELETAL: Denies joint or back pain currently. NEUROLOGICAL: Denies headache, numbness, weakness, tingling or dysarthria. PSYCHIATRIC: Has underlying anxiety. Denies depression.  Denies suicidal or  homicidal ideation.   PHYSICAL EXAMINATION:  VITAL SIGNS: Temperature 97.1, pulse 84, blood pressure 177/67 on arrival, currently 125/60, respirations 18, oxygen saturation 100% on room air.   GENERAL: Patient is alert and oriented, not acutely distressed.   HEENT: Normocephalic, atraumatic. Pupils are equal, round and reactive to light accommodation. Extraocular muscles are intact. Anicteric sclerae. Conjunctivae pink. Hearing intact to voice. Nares without drainage. Oral mucosa moist without lesions.   NECK: Supple with full range of motion. No jugular venous distention, lymphadenopathy, or carotid bruits bilaterally. No thyromegaly or tenderness to palpation over thyroid gland.   CHEST: Normal respiratory effort without use of accessory respiratory muscles. Lungs are clear to auscultation bilaterally without crackles, rales, or wheezes.   CARDIOVASCULAR: S1, S2 positive. Regular rate and rhythm. No murmurs, rubs, or gallops. PMI is non-lateralized. No tenderness to palpation over anterior chest wall.   ABDOMEN: Soft, nontender, nondistended. Normoactive bowel sounds. No hepatosplenomegaly or palpable masses. No hernias.   EXTREMITIES: No clubbing, cyanosis, or edema. Pedal pulses are palpable bilaterally.   SKIN: No suspicious rashes. Skin turgor is good.   LYMPH: No cervical lymphadenopathy.   NEUROLOGICAL: Alert and oriented x3. Cranial nerves II through XII are grossly intact. No focal deficits.   PSYCH: Pleasant female with appropriate affect.   LABORATORY, DIAGNOSTIC, AND RADIOLOGICAL DATA: Complete metabolic panel within normal limits except for BUN 24 and serum glucose 26. CK 76, CK-MB 1.1, troponin less than 0.02.  CBC within normal limits.   First EKG revealed atrial fibrillation, heart rate 138 beats per minute with right bundle branch block, left anterior hemiblock, nonspecific ST and T wave changes. A short while later she became transiently asystolic for seconds noted  on cardiac rhythm strip and repeat EKG when she came back to revealed normal sinus rhythm, heart rate 86 beats per minute, PACs with left axis deviation, left anterior hemiblock and left ventricular hypertrophy, nonspecific ST and T wave changes.   Portable chest x-ray: No acute cardiopulmonary abnormalities are noted.   ASSESSMENT AND PLAN: 74 year old female with past history of hypertension, hyperlipidemia, hypertriglyceridemia, type 2 diabetes mellitus, gastroesophageal reflux disease, coronary artery disease status post coronary artery bypass graft here with chest pain while at rest presenting with atrial fibrillation with RVR who became transiently asystolic in the ER with:  1. Unstable angina-Will admit patient to telemetry unit for further evaluation and management. Will rule out MI by continuing to cycle patient's cardiac enzymes with first set being negative. Will obtain echocardiogram and cardiology consultation for further recommendations. In the meanwhile will keep patient on oxygen, aspirin, Plavix, heparin drip beta blocker, nitrate, ACE inhibitor, and statin therapy. She is currently chest pain free. Further work-up and management to follow depending on patient's clinical course.  Will defer further decisions such as myoview versus cardiac catheterization to cardiology, however if she rules in for an MI then she will need to undergo repeat cardiac catheterization. 2. Atrial fibrillation with RVR-Patient has history of paroxysmal atrial fibrillation. She is now in normal sinus rhythm. Her known CHADS2 score is 2. For now she will be on heparin drip in addition to aspirin and Plavix until cardiology is consulted and will defer decisions regarding stroke prophylaxis to cardiology. Will continue rate control with beta blocker for now. Obtain 2-D echocardiogram. Continuous telemetry monitoring for now. Obtain cardiology consultation for further recommendations.  3. History of coronary artery  disease status post coronary artery bypass graft-Now patient presenting with unstable angina which will  be worked up as above and will rule out myocardial infarction and keep her on oxygen, aspirin, Plavix, heparin drip, beta blocker, nitrate, ACE inhibitor, and statin therapy pending cardiology consultation and obtain echocardiogram. As above, will defer decisions regarding Myoview versus repeat cardiac catheterization to cardiology.  4. Hypertension-Blood pressure better controlled now than at the time of initial arrival. Patient will be on beta blocker, nitrate, ACE inhibitor therapy and also will write for p.r.n. IV labetalol and continue to monitor blood pressure closely.  5. History of hyperlipidemia-Continue statin therapy. Check fasting lipid profile in a.m.  6. Type 2 diabetes mellitus-Continue glipizide. Check hemoglobin A1c. Also keep patient on sliding scale insulin and monitor sugars closely.  7. Gastroesophageal reflux disease-Start proton pump inhibitor therapy with Protonix.  8. Deep vein thrombosis prophylaxis-Heparin.  9. CODE STATUS: FULL CODE.    TIME SPENT ON THIS ADMISSION: Approximately 60 minutes.   ____________________________ Elon Alas, MD knl:cms D: 09/10/2011 08:53:27 ET T: 09/10/2011 09:14:20 ET JOB#: [id number]  cc: Elon Alas, MD, <Dictator> Leanna Sato, MD Darlin Priestly Lady Gary, MD Elon Alas MD ELECTRONICALLY SIGNED 09/21/2011 17:34

## 2014-07-04 NOTE — Consult Note (Signed)
General Aspect patient is a 74 year old female with history of coronary artery disease status post coronary artery bypass grafting including a left internal mammary to the LAD who was admitted with chest pain.  Patient is a somewhat difficult historian.  She has a history of diabetes, hyperlipidemia hypertension.  She presented to the emergency room with complaints of midsternal and mid epigastric pain.  She states he was both typical of her gastroesophageal reflux disease as well as her disease.  On arrival number his room, she was noted to be in nature fibrillation with fairly rapid ventricular response.  She was given IV Cardizem x2 and then had an asystolic pause followed by reverse and back to sinus rhythm.  Her pain resolved after she converted to sinus rhythm. she is ruled out for myocardial infarction thus far.  She reports compliance with her medications including Plavix.   Physical Exam:   GEN no acute distress, obese    HEENT PERRL, poor dentition    NECK supple    RESP normal resp effort  clear BS    CARD Regular rate and rhythm  No murmur    ABD denies tenderness  no Abdominal Bruits    LYMPH negative neck    EXTR negative cyanosis/clubbing, negative edema    SKIN normal to palpation    NEURO cranial nerves intact, motor/sensory function intact    PSYCH A+O to time, place, person   Review of Systems:   Subjective/Chief Complaint midsternal chest pain    General: Fatigue    Skin: No Complaints    ENT: No Complaints    Eyes: No Complaints    Neck: No Complaints    Respiratory: No Complaints    Cardiovascular: Chest pain or discomfort  Tightness    Gastrointestinal: No Complaints    Genitourinary: No Complaints    Vascular: No Complaints    Musculoskeletal: No Complaints    Neurologic: No Complaints    Hematologic: No Complaints    Endocrine: No Complaints    Psychiatric: No Complaints    Review of Systems: All other systems were reviewed and  found to be negative    Medications/Allergies Reviewed Medications/Allergies reviewed     UTI:    Stent, Cardiac:    CAD:    GERD - Esophageal Reflux:    Hypercholesterolemia:    Diabetes Mellitus, Type II (NIDD):    Hypertension:    c-section:    Other, see comments: cardiac caths   CABG (Coronary Artery Bypass Graft):   Home Medications: Medication Instructions Status  aspirin 81 mg oral tablet 1 tab(s) orally once a day Active  enalapril 10 mg oral tablet 1 tab(s) orally 4 times a day Active  metoprolol tartrate 50 mg oral tablet 1 tab(s) orally 2 times a day Active  Lipitor 40 mg oral tablet 1 tab(s) orally once a day (at bedtime) Active  glipiZIDE 10 mg oral tablet 1 tab(s) orally 2 times a day Active  Plavix 75 mg oral tablet 1 tab(s) orally once a day Active   EKG:   Interpretation initial atrial fibrillation with rapid ventricular response.  Now converted to sinus rhythm.  no evidence of injury or ischemia    Percocet: Alt Ment Status    Impression 74 year old female with history of coronary disease status post coronary artery bypass grafting, diabetes, hypertension and hyperlipidemia who was admitted after presenting to the emergency room with complaints of epigastric discomfort.  She was noted to be in nature fibrillation.  After  converting to sinus rhythm  she has had no further chest pain.  She is ruled out for myocardial infarction thus far.  Etiology of the pain may have been atrial fibrillation with rapid ventricular response.  She did have a pause when converting from A. fib is sinus rhythm suggestive of delayed atrial recovery however has had no history of syncope.  Would continue to rule out for myocardial infarction on current medical regimen and following for evidence of recurrent A. fib.  She is able to ambulate without any further chest pain and rules out, would discharge in the a.m. on current meds    Plan 1.  Continue with current medical regimen 2.   Rule out for myocardial infarction 3.  Follow for evidence of further arrhythmia on telemetry 4.  Ambulate and consider discharge in a.m. if stable on current medical regimen   Electronic Signatures: Dalia Heading (MD)  (Signed 01-Jul-13 14:00)  Authored: General Aspect/Present Illness, History and Physical Exam, Review of System, Past Medical History, Home Medications, EKG , Allergies, Impression/Plan   Last Updated: 01-Jul-13 14:00 by Dalia Heading (MD)

## 2014-07-04 NOTE — Discharge Summary (Signed)
PATIENT NAME:  Robin [name], Robin [name] MR#:  [id number] DATE OF BIRTH:  02-18-1941  DATE OF ADMISSION:  09/10/2011 DATE OF DISCHARGE:  09/11/2011  PRIMARY CARE PHYSICIAN: Dr. Darreld Mclean PRIMARY CARDIOLOGIST: Dr. Lady Gary   REASON FOR ADMISSION: Chest pain.   DISCHARGE DIAGNOSES: 1. Chest pain due to atrial fibrillation with rapid ventricular response with negative troponins with chest pain, resolved.  2. Paroxysmal atrial fibrillation, patient in normal sinus rhythm at the time of discharge. 3. History of coronary artery disease status post coronary artery bypass graft. 4. Hypercholesterolemia not at goal due to medication noncompliance.  5. Hypertension.  6. Type 2 diabetes mellitus. 7. Gastroesophageal reflux disease.  8. History of hypertriglyceridemia.  9. History of anxiety.  10. Mild left ventricular hypertrophy, mild aortic regurgitation, mild tricuspid regurgitation and mild aortic stenosis with normal left ventricular ejection fraction greater than 55% per two dimensional echocardiogram on 09/10/2011.   CONSULTATION: Cardiology, Dr. Lady Gary.   DISCHARGE DISPOSITION: Home.   DISCHARGE MEDICATIONS: 1. Aspirin 81 mg daily. 2. Plavix 75 mg p.o. daily.  3. Metoprolol 50 mg p.o. every 12 hours. 4. Enalapril 10 mg p.o. q.i.d.  5. Glipizide 10 mg p.o. b.i.d.  6. Lipitor 40 mg p.o. daily.  7. Nitrostat 0.4 mg sublingually q.5 minutes x3 p.r.n. chest pain. 8. Tylenol 325 mg 1 to 2 tablets p.o. q.4-6 hours p.r.n. pain.  9. Zantac 75 mg p.o. b.i.d.   DISCHARGE CONDITION: Improved, stable. Patient is chest pain free at the time of discharge.   DISCHARGE ACTIVITY: As tolerated.   DISCHARGE DIET: Low sodium, ADA, low fat, low cholesterol, low triglyceride.   DISCHARGE INSTRUCTIONS:  1. Take medications as prescribed.  2. Return to Emergency Department for recurrence of symptoms.   FOLLOW UP INSTRUCTIONS: 1. Follow up with Dr. Lady Gary within 2 to 3 weeks. Patient should be scheduled for  outpatient Myoview. 2. Follow up with Dr. Marvis Moeller within 1 to 2 weeks.   LABORATORY, DIAGNOSTIC AND RADIOLOGICAL DATA:  Portable chest x-ray 09/10/2011: No acute cardiopulmonary abnormalities are noted.   2-D echocardiogram 09/10/2011 with mildly dilated left atrium. LV systolic function normal. Ejection fraction greater than 55%, mild concentric left ventricular hypertrophy, mild AR, mild TR, mild valvular aortic stenosis.   Cardiac enzymes are negative x3 sets with negative CK and CK-MB, and troponins.   TSH 1.46.   CBC normal on admission.   Complete metabolic panel normal on admission except for serum glucose 266, BUN 24. BUN is 21 at the time of discharge.   Hemoglobin A1c 8.  Lipid panel: Total cholesterol 211, triglycerides 255, HDL 42, LDL 118.   EKG on admission revealed atrial fibrillation with rapid ventricular response, heart rate 138 beats per minute with right bundle branch block, left anterior hemiblock, nonspecific ST and T wave changes and patient received 20 mg IV Cardizem and thereafter follow up EKG revealed normal sinus rhythm, heart rate 86 beats per minute with PACs, left axis deviation, left anterior hemiblock, left ventricular hypertrophy and nonspecific ST and T wave changes.   PHYSICAL EXAMINATION: VITAL SIGNS: Temperature 98.2, pulse 61, blood pressure 128/68, respirations 20, oxygen saturation 96% to 99% on room air. GENERAL: Patient alert and oriented, not acutely distressed. HEENT: Normocephalic, atraumatic. Pupils equal, round, and reactive to light and accommodation. Extraocular movements are intact. Anicteric sclerae. Conjunctivae pink. Hearing intact to voice. Nares without drainage. Oral mucus moist without lesions. NECK: Supple with full range of motion. No jugular venous distention, lymphadenopathy, or carotid bruits bilaterally. No thyromegaly or  tenderness to palpation over thyroid gland. CHEST: Normal respiratory effort without use of accessory respiratory  muscles. Lungs are clear to auscultation bilaterally without crackles, rales, or wheezes. CARDIOVASCULAR: S1, S2 positive. Regular rate and rhythm. No murmurs, rubs, or gallops.  No tenderness to palpation over anterior chest wall. ABDOMEN: Soft, nontender, nondistended. Normoactive bowel sounds. No hepatosplenomegaly or palpable masses. No hernias. EXTREMITIES: No clubbing, cyanosis, or edema. Pedal pulses are palpable bilaterally. SKIN: No suspicious rashes. Skin turgor is good. LYMPH: No cervical lymphadenopathy. NEUROLOGICAL: Alert and oriented x3. Cranial nerves II through XII are grossly intact. No focal deficits. PSYCH: Pleasant female with appropriate affect.   BRIEF  HISTORY/HOSPITAL COURSE: Patient is a 74 year old female with past medical history of hypertension, hyperlipidemia, hypertriglyceridemia, type 2 diabetes mellitus, coronary artery disease status post coronary artery bypass graft, gastroesophageal reflux disease, paroxysmal atrial fibrillation and anxiety presented to the Emergency Department secondary to chest pain. Please see dictated admission history and physical for pertinent details surrounding the onset of this hospitalization and please see below for further details.  1. Chest pain and patient noted to be in atrial fibrillation with RVR at the time of admission and was significantly tachycardic with heart rate up to the 140s. As above initial EKG reveals atrial fibrillation with RVR. First set of cardiac enzymes are negative. She received two separate doses of 10 mg IV Cardizem bolus doses and thereafter as spontaneously converted to normal sinus rhythm with control of her heart rate thereafter. She did have transient cardiac pause when converting from atrial fibrillation to normal sinus rhythm and this mild delay was felt to be normal termination pause as per cardiologist assessment. In regards to atrial fibrillation, she was only taking metoprolol tartrate which is short acting 50  mg once a day at home and we have increased this to twice a day (every 12 hours) and since doing so her heart rate has been well controlled and she has no longer had any atrial fibrillation since being admitted to the hospital as she was placed on a continuous telemetry monitor. Once she converted to normal sinus rhythm she has remained in normal sinus rhythm and atrial fibrillation has not recurred. Once she converted to normal sinus rhythm she became chest pain free and remained so for the entirety of this hospitalization. She is chest pain free at the time of discharge at rest and also with ambulation. She has ruled out for myocardial infarction based on three negative sets of cardiac enzymes. Initially she was on oxygen, aspirin, heparin drip, Plavix, beta blocker, nitrate, ACE inhibitor, and statin therapy. Once she had ruled out for myocardial infarction her heparin drip was stopped as per cardiologist recommendations. Cardiology feels that her chest pain was due to atrial fibrillation with RVR rather than coronary ischemia at this time. She will, however, need to follow up with Dr. Lady Gary in the office and will need to have an outpatient Myoview repeated as per Dr. America Brown recommendations. In the meanwhile for her coronary artery disease she will continue medical management with aspirin, Plavix, beta blocker, ACE inhibitor, and statin therapy. Her LDL and her total cholesterol are not at goal this is due to medication noncompliance with her Lipitor to which she admitted and I have strongly counseled and advised her to remain compliant with her Lipitor and also advised her to consume a low fat, low cholesterol diet and to exercise often. She also has hypertriglyceridemia but we do not want to start her on concomitant fibrate along  with her statin due to increased risk of myopathy and rhabdomyolysis and myositis. She was advised a low triglyceride diet for now and advised to exercise often. For atrial fibrillation  with RVR as above she will be on rate control with metoprolol at an increased frequency for now. Her CHADS2 score is 2 and she will be maintained on aspirin and Plavix for stroke prophylaxis and cardiology did not feel she needs anticoagulation at this time.  2. For hypertension, blood pressure is well controlled at the time of discharge and patient will continue beta blocker and ACE inhibitor therapy. Her beta blocker frequency has been increased as above.  3. For type 2 diabetes mellitus, this can be somewhat better controlled given hemoglobin A1c of 8 and this will be deferred to her primary care physician and she will be maintained on glipizide in the meanwhile, was also advised ADA diet. 4. Gastroesophageal reflux disease. There are some studies that show inactivation of Plavix with concomitant use of PPIs therefore she will be discharged home on Zantac.  5. On 09/11/2011 patient was hemodynamically stable and without any chest pain and was felt to be stable for discharge home with close outpatient follow up to which patient was agreeable.       TIME SPENT ON DISCHARGE: Greater than 30 minutes.   ____________________________ Elon Alas, MD knl:cms D: 09/11/2011 11:49:50 ET T: 09/12/2011 10:04:55 ET JOB#: [id number]  cc: Elon Alas, MD, <Dictator> Leanna Sato, MD Darlin Priestly Lady Gary, MD Elon Alas MD ELECTRONICALLY SIGNED 09/21/2011 17:34

## 2014-07-19 DIAGNOSIS — I10 Essential (primary) hypertension: Secondary | ICD-10-CM | POA: Insufficient documentation

## 2014-07-19 DIAGNOSIS — Z79899 Other long term (current) drug therapy: Secondary | ICD-10-CM | POA: Diagnosis not present

## 2014-07-19 DIAGNOSIS — M79642 Pain in left hand: Secondary | ICD-10-CM | POA: Diagnosis not present

## 2014-07-19 DIAGNOSIS — Z7982 Long term (current) use of aspirin: Secondary | ICD-10-CM | POA: Diagnosis not present

## 2014-07-19 DIAGNOSIS — E119 Type 2 diabetes mellitus without complications: Secondary | ICD-10-CM | POA: Insufficient documentation

## 2014-07-20 ENCOUNTER — Encounter: Payer: Self-pay | Admitting: Emergency Medicine

## 2014-07-20 ENCOUNTER — Emergency Department
Admission: EM | Admit: 2014-07-20 | Discharge: 2014-07-20 | Disposition: A | Discharge status: Home / Self Care | Payer: Medicare Other | Attending: Emergency Medicine | Admitting: Emergency Medicine

## 2014-07-20 ENCOUNTER — Emergency Department: Payer: Medicare Other

## 2014-07-20 DIAGNOSIS — M79642 Pain in left hand: Secondary | ICD-10-CM

## 2014-07-20 DIAGNOSIS — M7989 Other specified soft tissue disorders: Secondary | ICD-10-CM

## 2014-07-20 HISTORY — DX: Unspecified atrial fibrillation: I48.91

## 2014-07-20 HISTORY — DX: Atherosclerotic heart disease of native coronary artery without angina pectoris: I25.10

## 2014-07-20 HISTORY — DX: Heart failure, unspecified: I50.9

## 2014-07-20 HISTORY — DX: Essential (primary) hypertension: I10

## 2014-07-20 HISTORY — DX: Hyperlipidemia, unspecified: E78.5

## 2014-07-20 HISTORY — DX: Anxiety disorder, unspecified: F41.9

## 2014-07-20 HISTORY — DX: Type 2 diabetes mellitus without complications: E11.9

## 2014-07-20 LAB — BASIC METABOLIC PANEL
ANION GAP: 10 (ref 5–15)
BUN: 31 mg/dL — ABNORMAL HIGH (ref 6–20)
CALCIUM: 8.7 mg/dL — AB (ref 8.9–10.3)
CHLORIDE: 109 mmol/L (ref 101–111)
CO2: 24 mmol/L (ref 22–32)
Creatinine, Ser: 0.76 mg/dL (ref 0.44–1.00)
GFR calc Af Amer: >60 mL/min (ref >60)
Glucose, Bld: 165 mg/dL — ABNORMAL HIGH (ref 65–99)
Potassium: 3.7 mmol/L (ref 3.5–5.1)
Sodium: 143 mmol/L (ref 135–145)

## 2014-07-20 LAB — CBC
HCT: 34.8 % — ABNORMAL LOW (ref 35.0–47.0)
HEMOGLOBIN: 10.9 g/dL — AB (ref 12.0–16.0)
MCH: 26.1 pg (ref 26.0–34.0)
MCHC: 31.5 g/dL — ABNORMAL LOW (ref 32.0–36.0)
MCV: 83.1 fL (ref 80.0–100.0)
Platelets: 256 10*3/uL (ref 150–440)
RBC: 4.19 MIL/uL (ref 3.80–5.20)
RDW: 15.4 % — ABNORMAL HIGH (ref 11.5–14.5)
WBC: 13.2 10*3/uL — ABNORMAL HIGH (ref 3.6–11.0)

## 2014-07-20 LAB — TROPONIN I

## 2014-07-20 MED ORDER — IBUPROFEN 600 MG PO TABS
600.0000 mg | ORAL_TABLET | Freq: Once | ORAL | Status: AC
  Administered 2014-07-20: 600 mg via ORAL

## 2014-07-20 MED ORDER — FUROSEMIDE 40 MG PO TABS
20.0000 mg | ORAL_TABLET | Freq: Once | ORAL | Status: AC
  Administered 2014-07-20: 20 mg via ORAL
  Filled 2014-07-20: qty 0.5

## 2014-07-20 MED ORDER — IBUPROFEN 600 MG PO TABS
ORAL_TABLET | ORAL | Status: AC
  Administered 2014-07-20: 600 mg via ORAL
  Filled 2014-07-20: qty 1

## 2014-07-20 NOTE — ED Notes (Signed)
Pt going to ultrasound.

## 2014-07-20 NOTE — ED Notes (Signed)
Patient ambulatory to triage with steady gait, without difficulty or distress noted; pt reports left arm pain since 9pm; denies any accomp symptoms

## 2014-07-20 NOTE — ED Provider Notes (Addendum)
[facility] Emergency Department Provider Note  ____________________________________________  Time seen: Approximately 308 AM  I have reviewed the triage vital signs and the nursing notes.   HISTORY  Chief Complaint Hand Pain    HPI Robin [name] is a 74 y.o. female who comes into the emergency department today because she has some swelling of her hands and some pain in her left arm. The patient reports that she also has a tendency to retain water and her ankles have been swollen for the past 3 days. The patient reports that today the wrist swelling and arm pain started. She reports that her entire arms hurt. She denies any trauma but was concerned she may be having a stroke she came into the emergency department. The patient did not take anything at home for pain and her blood pressure is elevated. The patient reports that while she was in the waiting room she fell asleep and woke up with some pain in her chest. The patient reports that she has not taken some of her medication in over 3 days as a pharmacy wanted to pay for them and did not have the money. She reports that she was admitted to [facility] 2 weeks ago. He reports that her arm is sore and achy. She rates the pain a 9 out of 10 in intensity. She reports it is worse with movement and nothing tends to make it better. The patient reports that she has been on some new medications for her blood pressure but she does not remember the names of all of her medications. She also does not have everything with her. She is here for further evaluation.   Past Medical History  Diagnosis Date  . Hypertension   . Diabetes mellitus without complication   . CHF (congestive heart failure)   . Coronary artery disease     There are no active problems to display for this patient.   No past surgical history on file.  Current Outpatient Rx  Name  Route  Sig  Dispense  Refill  . albuterol (ACCUNEB) 1.25 MG/3ML nebulizer  solution   Nebulization   Take 1.25 mg by nebulization every 6 (six) hours as needed. For wheezing         . amLODipine (NORVASC) 10 MG tablet   Oral   Take 10 mg by mouth daily.         Marland Kitchen aspirin 81 MG chewable tablet   Oral   Chew 81 mg by mouth daily.         Marland Kitchen atorvastatin (LIPITOR) 40 MG tablet   Oral   Take 40 mg by mouth daily.         . dabigatran (PRADAXA) 150 MG CAPS capsule   Oral   Take 150 mg by mouth daily.         . furosemide (LASIX) 20 MG tablet   Oral   Take 20 mg by mouth 2 (two) times daily.         Marland Kitchen glipiZIDE (GLUCOTROL XL) 10 MG 24 hr tablet   Oral   Take 10 mg by mouth daily.         . metFORMIN (GLUCOPHAGE) 500 MG tablet   Oral   Take 250 mg by mouth 2 (two) times daily.         . metoprolol succinate (TOPROL-XL) 25 MG 24 hr tablet   Oral   Take 12.5 mg by mouth daily.         Marland Kitchen  nitroGLYCERIN (NITROSTAT) 0.4 MG SL tablet   Sublingual   Place 0.4 mg under the tongue every 5 (five) minutes as needed for chest pain.          . ranitidine (ZANTAC) 150 MG tablet   Oral   Take 150 mg by mouth.           Allergies Review of patient's allergies indicates no known allergies.  No family history on file.  Social History History  Substance Use Topics  . Smoking status: Never Smoker   . Smokeless tobacco: Not on file  . Alcohol Use: No    Review of Systems Constitutional: No fever/chills Eyes: No visual changes. ENT: No sore throat. Cardiovascular: chest pain. Respiratory: Denies shortness of breath. Gastrointestinal: No abdominal pain.  No nausea, no vomiting.  . Genitourinary: Negative for dysuria. Musculoskeletal: Negative for back pain. Skin: Negative for rash. Neurological: Negative for headaches, focal weakness or numbness. 10-point ROS otherwise negative.  ____________________________________________   PHYSICAL EXAM:  VITAL SIGNS: ED Triage Vitals  Enc Vitals Group     BP 07/20/14 0024 211/61 mmHg      Pulse Rate 07/20/14 0024 67     Resp 07/20/14 0024 18     Temp 07/20/14 0024 97.9 F (36.6 C)     Temp src --      SpO2 07/20/14 0024 98 %     Weight 07/20/14 0024 168 lb (76.204 kg)     Height 07/20/14 0024  (1.626 m)     Head Cir --      Peak Flow --      Pain Score 07/20/14 0026 9     Pain Loc --      Pain Edu? --      Excl. in GC? --     Constitutional: Alert and oriented. Well appearing and in mild distress. Eyes: Conjunctivae are normal. PERRL. EOMI. Head: Atraumatic. Nose: No congestion/rhinnorhea. Mouth/Throat: Mucous membranes are moist.  Oropharynx non-erythematous. Cardiovascular: Tachycardic irregular rate and rhythm. Grade 2/6 systolic murmur.  Good peripheral circulation. Respiratory: Normal respiratory effort.  No retractions. Lungs CTAB. Gastrointestinal: Soft and nontender. No distention. No abdominal bruits. No CVA tenderness. Genitourinary: Deferred Musculoskeletal: Bilateral lower extremity edema Neurologic:  Normal speech and language. No gross focal neurologic deficits are appreciated. Speech is normal. No gait instability. Skin:  Skin is warm, dry and intact. No rash noted. Psychiatric: Mood and affect are normal. Speech and behavior are normal.  ____________________________________________   LABS (all labs ordered are listed, but only abnormal results are displayed)  Labs Reviewed  CBC - Abnormal; Notable for the following:    WBC 13.2 (*)    Hemoglobin 10.9 (*)    HCT 34.8 (*)    MCHC 31.5 (*)    RDW 15.4 (*)    All other components within normal limits  BASIC METABOLIC PANEL - Abnormal; Notable for the following:    Glucose, Bld 165 (*)    BUN 31 (*)    Calcium 8.7 (*)    All other components within normal limits  TROPONIN I  TROPONIN I   ____________________________________________  EKG  ED ECG REPORT   Date: 07/20/2014  EKG Time: 030  Rate: 65  Rhythm: normal EKG, normal sinus rhythm  Axis: Left axis deviation   Intervals:none  ST&T Change: Flipped T-wave in lead aVL but otherwise no ST segment changes  ____________________________________________  RADIOLOGY  Chest x-ray: Stable cardiomegaly Doppler left upper extremity: No evidence of DVT ____________________________________________  PROCEDURES  Procedure(s) performed: None  Critical Care performed: No  ____________________________________________   INITIAL IMPRESSION / ASSESSMENT AND PLAN / ED COURSE  Pertinent labs & imaging results that were available during my care of the patient were reviewed by me and considered in my medical decision making (see chart for details).  This is a 74 year old female who comes in with some left arm pain that started today. She also has had some swelling in her legs for 3 days and she has not been taking her medication including her Lasix. We will check some initial blood work as well as a chest x-ray and left upper extremity Doppler given the pain. We will also evaluate a chest x-ray. If everything does appear to be unremarkable we will discharge the patient to follow up with her primary care physician.  The patient was given a dose of ibuprofen. She had a repeat of her troponin which was also negative. The Doppler and the x-ray are also negative so the patient be discharged home to follow-up with her primary care physician. The patient's blood pressure is improved in the emergency department. ____________________________________________  Discussed with the patient that she needs to follow up with her physician to determine her appropriate medication. The patient has been instructed to call today to make an appointment.   FINAL CLINICAL IMPRESSION(S) / ED DIAGNOSES  Final diagnoses:  Hand pain, left      Rebecka Apley, MD 07/20/14 0740  Rebecka Apley, MD 07/20/14 [phone]

## 2014-07-20 NOTE — Discharge Instructions (Signed)
Musculoskeletal Pain  Musculoskeletal pain is muscle and boney aches and pains. These pains can occur in any part of the body. Your caregiver may treat you without knowing the cause of the pain. They may treat you if blood or urine tests, X-rays, and other tests were normal.   CAUSES  There is often not a definite cause or reason for these pains. These pains may be caused by a type of germ (virus). The discomfort may also come from overuse. Overuse includes working out too hard when your body is not fit. Boney aches also come from weather changes. Bone is sensitive to atmospheric pressure changes.  HOME CARE INSTRUCTIONS    Ask when your test results will be ready. Make sure you get your test results.   Only take over-the-counter or prescription medicines for pain, discomfort, or fever as directed by your caregiver. If you were given medications for your condition, do not drive, operate machinery or power tools, or sign legal documents for 24 hours. Do not drink alcohol. Do not take sleeping pills or other medications that may interfere with treatment.   Continue all activities unless the activities cause more pain. When the pain lessens, slowly resume normal activities. Gradually increase the intensity and duration of the activities or exercise.   During periods of severe pain, bed rest may be helpful. Lay or sit in any position that is comfortable.   Putting ice on the injured area.   Put ice in a bag.   Place a towel between your skin and the bag.   Leave the ice on for 15 to 20 minutes, 3 to 4 times a day.   Follow up with your caregiver for continued problems and no reason can be found for the pain. If the pain becomes worse or does not go away, it may be necessary to repeat tests or do additional testing. Your caregiver may need to look further for a possible cause.  SEEK IMMEDIATE MEDICAL CARE IF:   You have pain that is getting worse and is not relieved by medications.   You develop chest pain  that is associated with shortness or breath, sweating, feeling sick to your stomach (nauseous), or throw up (vomit).   Your pain becomes localized to the abdomen.   You develop any new symptoms that seem different or that concern you.  MAKE SURE YOU:    Understand these instructions.   Will watch your condition.   Will get help right away if you are not doing well or get worse.  Document Released: 02/26/2005 Document Revised: 05/21/2011 Document Reviewed: 10/31/2012  [employer] Patient Information 2015 [employer]. This information is not intended to replace advice given to you by your health care provider. Make sure you discuss any questions you have with your health care provider.  Chest Pain (Nonspecific)  It is often hard to give a specific diagnosis for the cause of chest pain. There is always a chance that your pain could be related to something serious, such as a heart attack or a blood clot in the lungs. You need to follow up with your health care provider for further evaluation.  CAUSES    Heartburn.   Pneumonia or bronchitis.   Anxiety or stress.   Inflammation around your heart (pericarditis) or lung (pleuritis or pleurisy).   A blood clot in the lung.   A collapsed lung (pneumothorax). It can develop suddenly on its own (spontaneous pneumothorax) or from trauma to the chest.   Shingles   infection (herpes zoster virus).  The chest wall is composed of bones, muscles, and cartilage. Any of these can be the source of the pain.   The bones can be bruised by injury.   The muscles or cartilage can be strained by coughing or overwork.   The cartilage can be affected by inflammation and become sore (costochondritis).  DIAGNOSIS   Lab tests or other studies may be needed to find the cause of your pain. Your health care provider may have you take a test called an ambulatory electrocardiogram (ECG). An ECG records your heartbeat patterns over a 24-hour period. You may also have other tests, such  as:   Transthoracic echocardiogram (TTE). During echocardiography, sound waves are used to evaluate how blood flows through your heart.   Transesophageal echocardiogram (TEE).   Cardiac monitoring. This allows your health care provider to monitor your heart rate and rhythm in real time.   Holter monitor. This is a portable device that records your heartbeat and can help diagnose heart arrhythmias. It allows your health care provider to track your heart activity for several days, if needed.   Stress tests by exercise or by giving medicine that makes the heart beat faster.  TREATMENT    Treatment depends on what may be causing your chest pain. Treatment may include:   Acid blockers for heartburn.   Anti-inflammatory medicine.   Pain medicine for inflammatory conditions.   Antibiotics if an infection is present.   You may be advised to change lifestyle habits. This includes stopping smoking and avoiding alcohol, caffeine, and chocolate.   You may be advised to keep your head raised (elevated) when sleeping. This reduces the chance of acid going backward from your stomach into your esophagus.  Most of the time, nonspecific chest pain will improve within 2-3 days with rest and mild pain medicine.   HOME CARE INSTRUCTIONS    If antibiotics were prescribed, take them as directed. Finish them even if you start to feel better.   For the next few days, avoid physical activities that bring on chest pain. Continue physical activities as directed.   Do not use any tobacco products, including cigarettes, chewing tobacco, or electronic cigarettes.   Avoid drinking alcohol.   Only take medicine as directed by your health care provider.   Follow your health care provider's suggestions for further testing if your chest pain does not go away.   Keep any follow-up appointments you made. If you do not go to an appointment, you could develop lasting (chronic) problems with pain. If there is any problem keeping an  appointment, call to reschedule.  SEEK MEDICAL CARE IF:    Your chest pain does not go away, even after treatment.   You have a rash with blisters on your chest.   You have a fever.  SEEK IMMEDIATE MEDICAL CARE IF:    You have increased chest pain or pain that spreads to your arm, neck, jaw, back, or abdomen.   You have shortness of breath.   You have an increasing cough, or you cough up blood.   You have severe back or abdominal pain.   You feel nauseous or vomit.   You have severe weakness.   You faint.   You have chills.  This is an emergency. Do not wait to see if the pain will go away. Get medical help at once. Call your local emergency services (911 in [place] Do not drive yourself to the hospital.  MAKE SURE   YOU:    Understand these instructions.   Will watch your condition.   Will get help right away if you are not doing well or get worse.  Document Released: 12/06/2004 Document Revised: 03/03/2013 Document Reviewed: 10/02/2007  [employer] Patient Information 2015 [employer]. This information is not intended to replace advice given to you by your health care provider. Make sure you discuss any questions you have with your health care provider.

## 2014-08-06 IMAGING — CT CT HEAD WITHOUT CONTRAST
1 series · 16 of 30 positions shown, 20 images · non-contrast
Comparison: none

REASON FOR EXAM: DIZZINESS OFF BALANCE
COMMENTS:

PROCEDURE:     CT  - CT HEAD WITHOUT CONTRAST  - May 24, 2012  [DATE]
RESULT:     Comparison:  09/07/2009
TECHNIQUE: Multiple axial images from the foramen magnum to the vertex were
obtained without IV contrast.

[Series 4: soft (id) · axial · 0.42mm/px · z∈[-147,-17]mm · 16 of 31 slices shown, 20 images]
[im 2/31  brain]
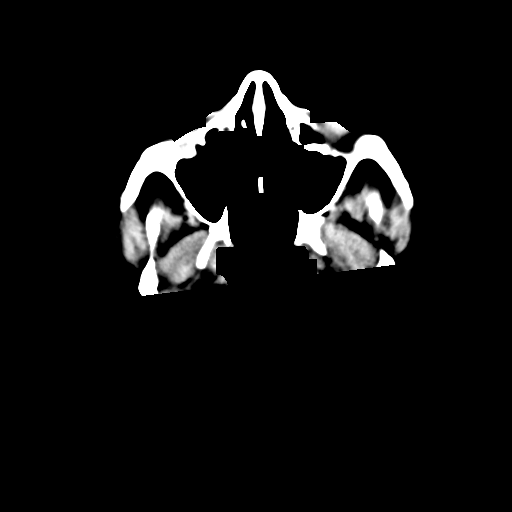
[im 2/31  bone]
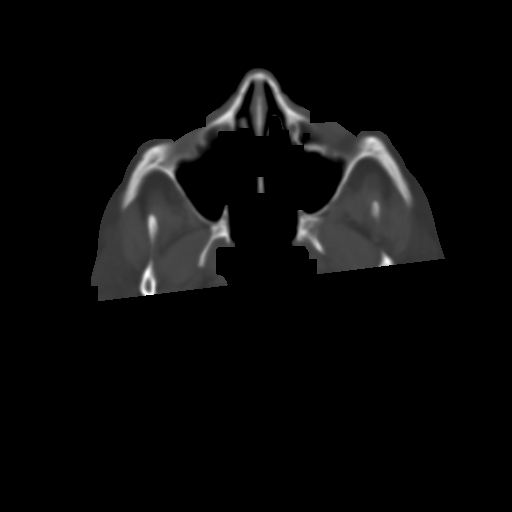
[im 4/31  brain]
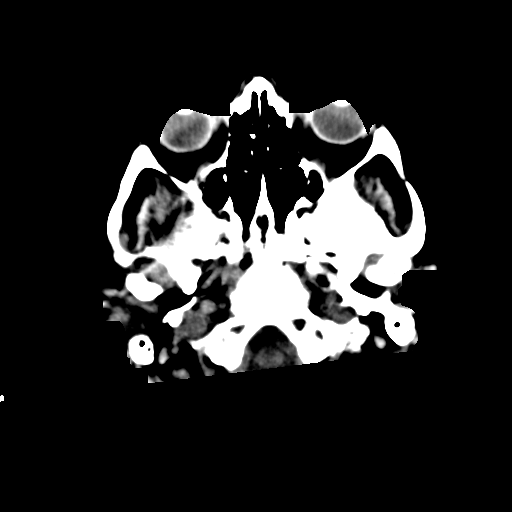
[im 6/31  brain]
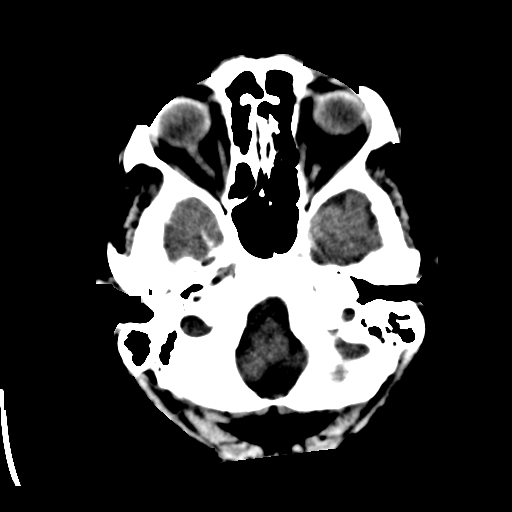
[im 8/31  brain]
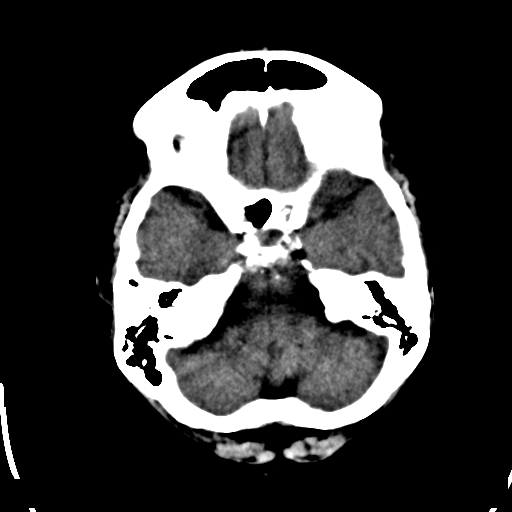
[im 9/31  brain]
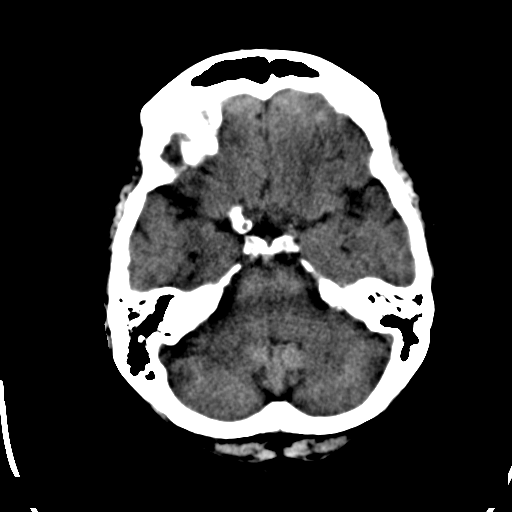
[im 9/31  bone]
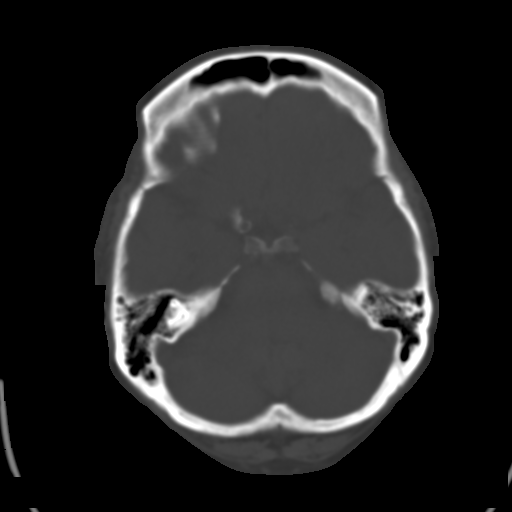
[im 11/31  brain]
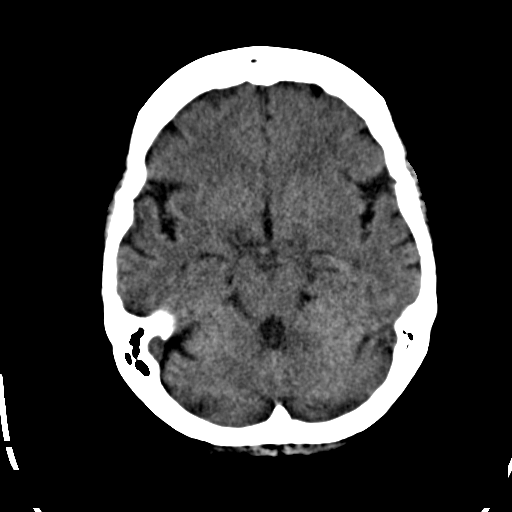
[im 13/31  brain]
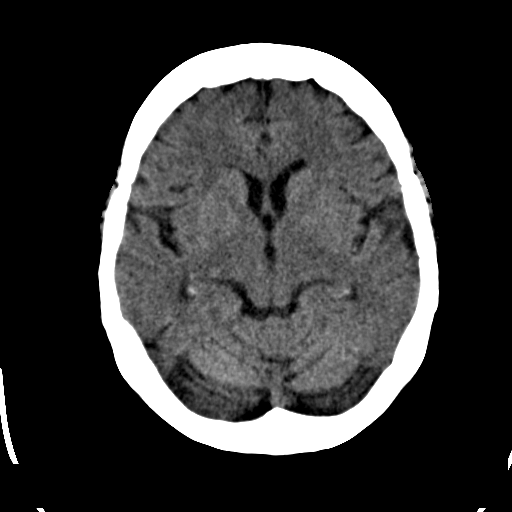
[im 15/31  brain]
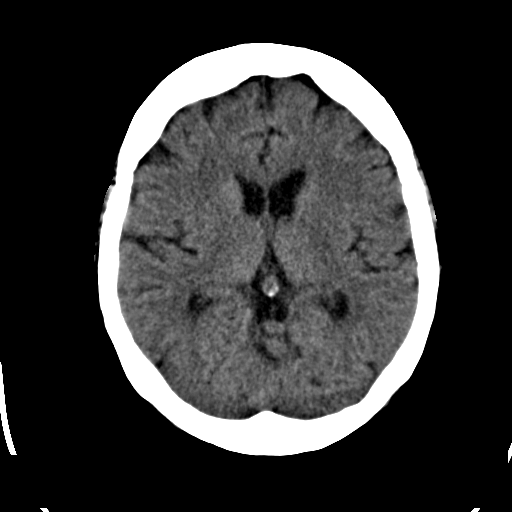
[im 16/31  brain]
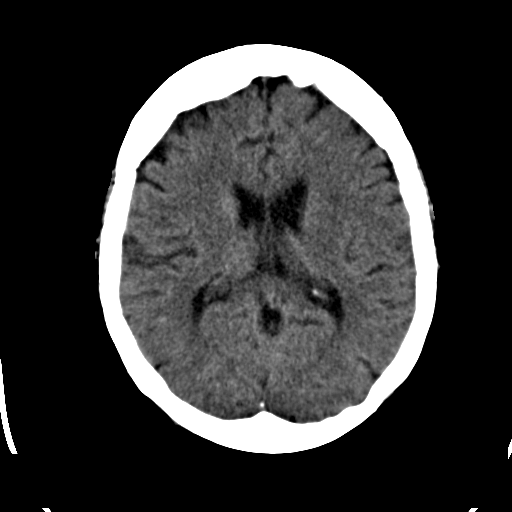
[im 16/31  bone]
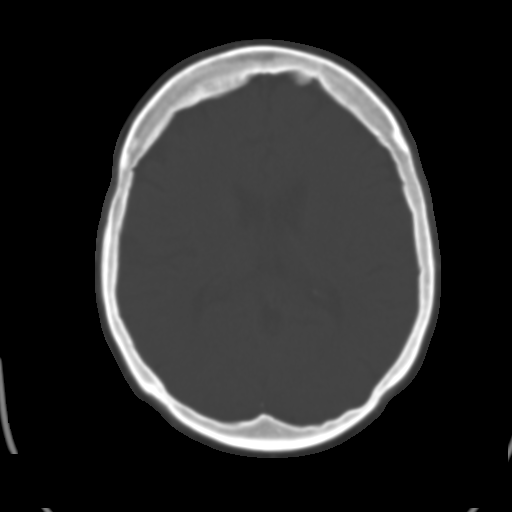
[im 18/31  brain]
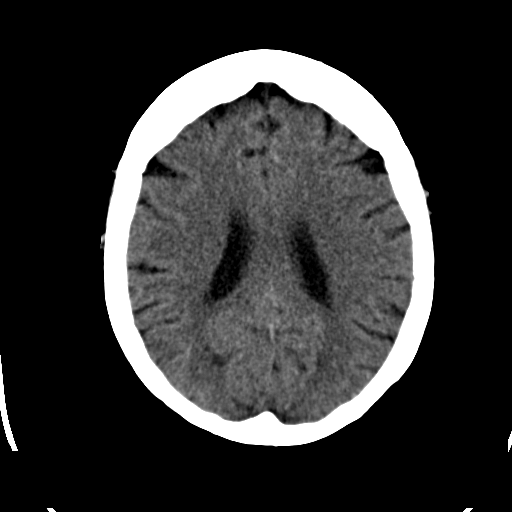
[im 20/31  brain]
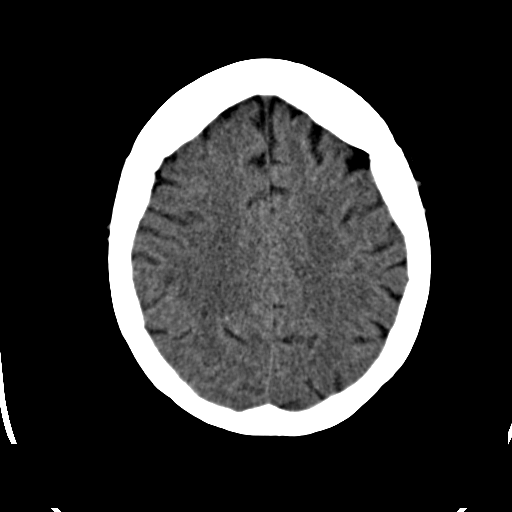
[im 22/31  brain]
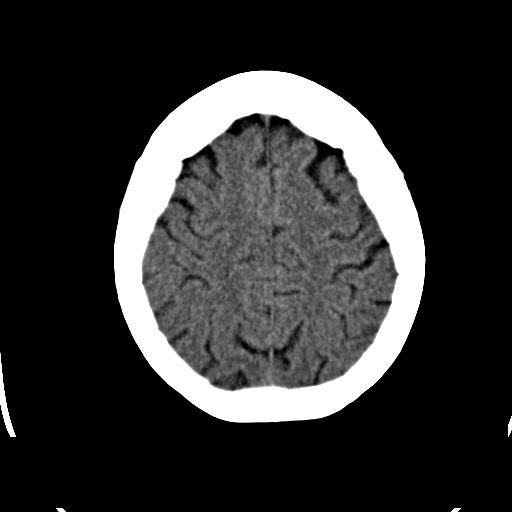
[im 23/31  brain]
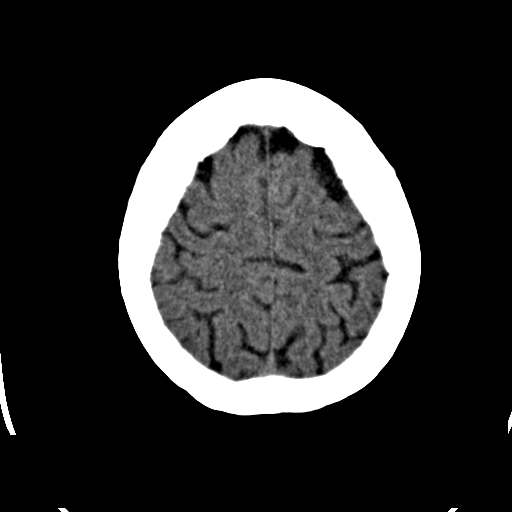
[im 23/31  bone]
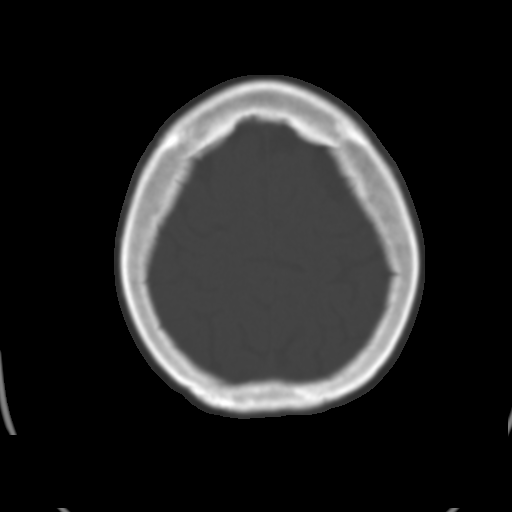
[im 25/31  brain]
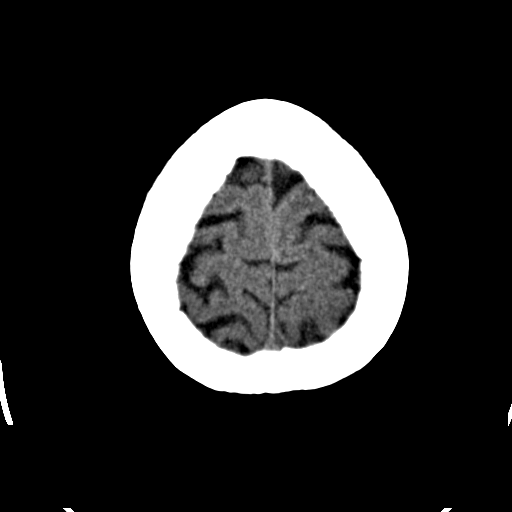
[im 27/31  brain]
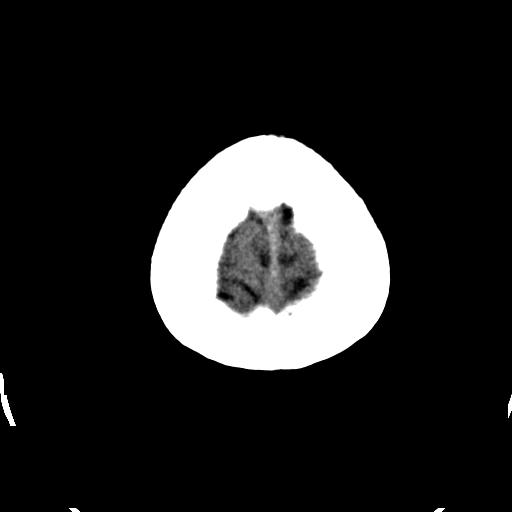
[im 29/31  brain]
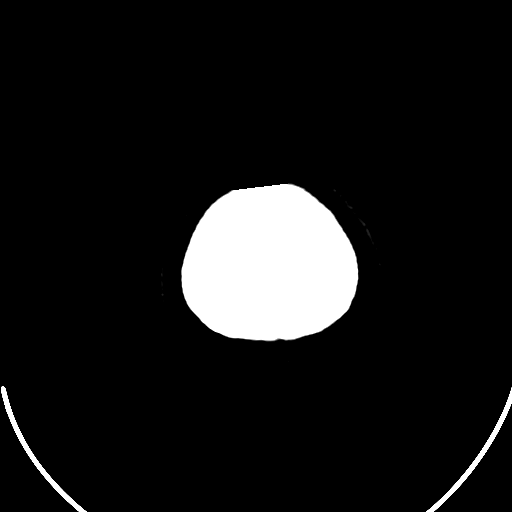

[16 of 30 positions shown; findings below may reference images not displayed]

FINDINGS: There is no evidence for mass effect, midline shift, or extra-axial fluid
collections. There is no evidence for space-occupying lesion, intracranial
hemorrhage, or cortical-based area of infarction. Minimal patchy subcortical
hypoattenuation is likely sequela of chronic microangiopathy.

The osseous structures are unremarkable.
IMPRESSION: No acute intracranial process.

[REDACTED]

## 2014-08-06 IMAGING — CR DG CHEST 2V
1 series · 2 of 2 positions shown · non-contrast
Comparison: none

REASON FOR EXAM: WHEEZING
COMMENTS:

PROCEDURE:     DXR - DXR CHEST PA (OR AP) AND LATERAL  - May 24, 2012  [DATE]
RESULT:     Comparison: 03/17/2012, 11/26/2010

[Series 1: w chest pa · 0.14mm/px · 2 of 2 slices shown]
[im 1/2]
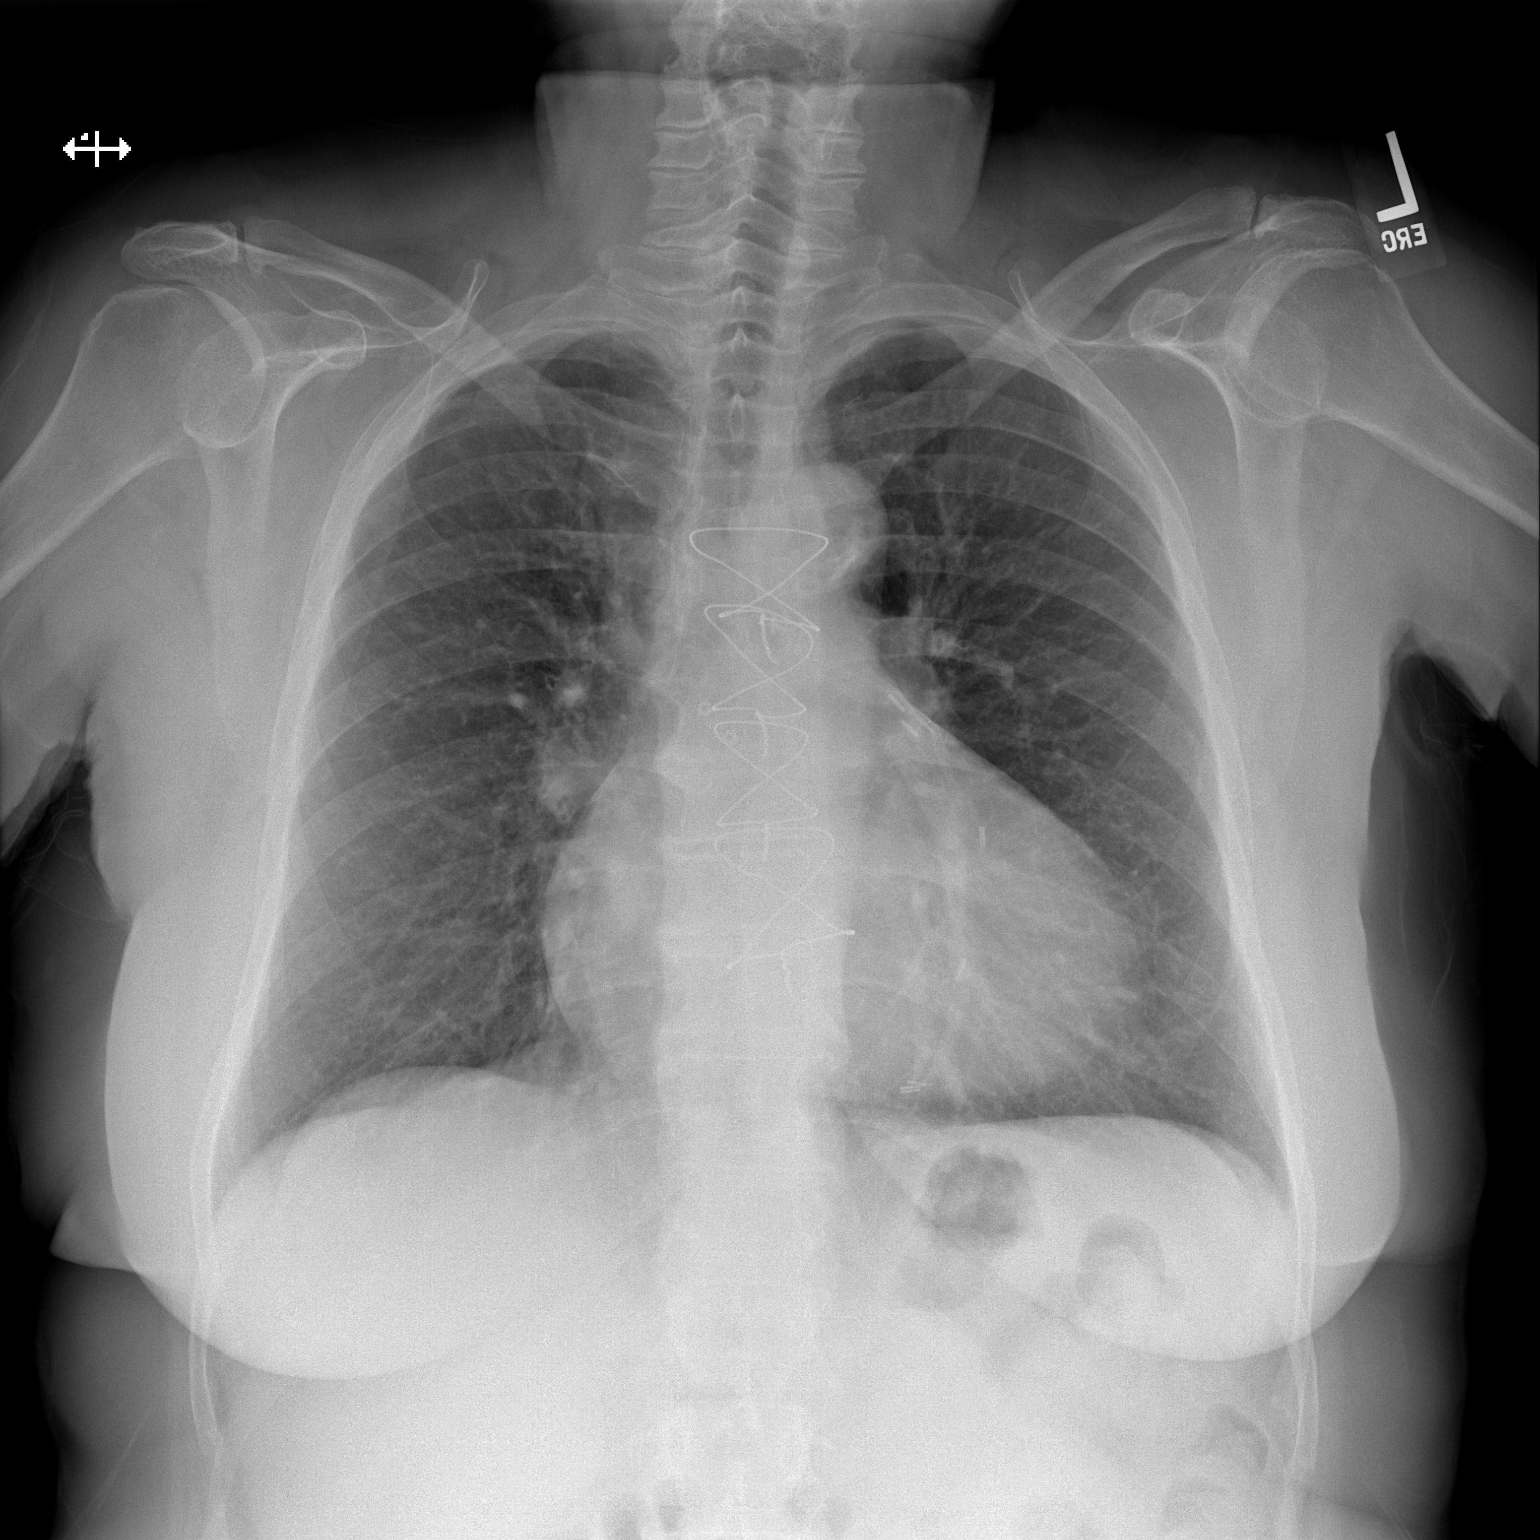
[im 2/2]
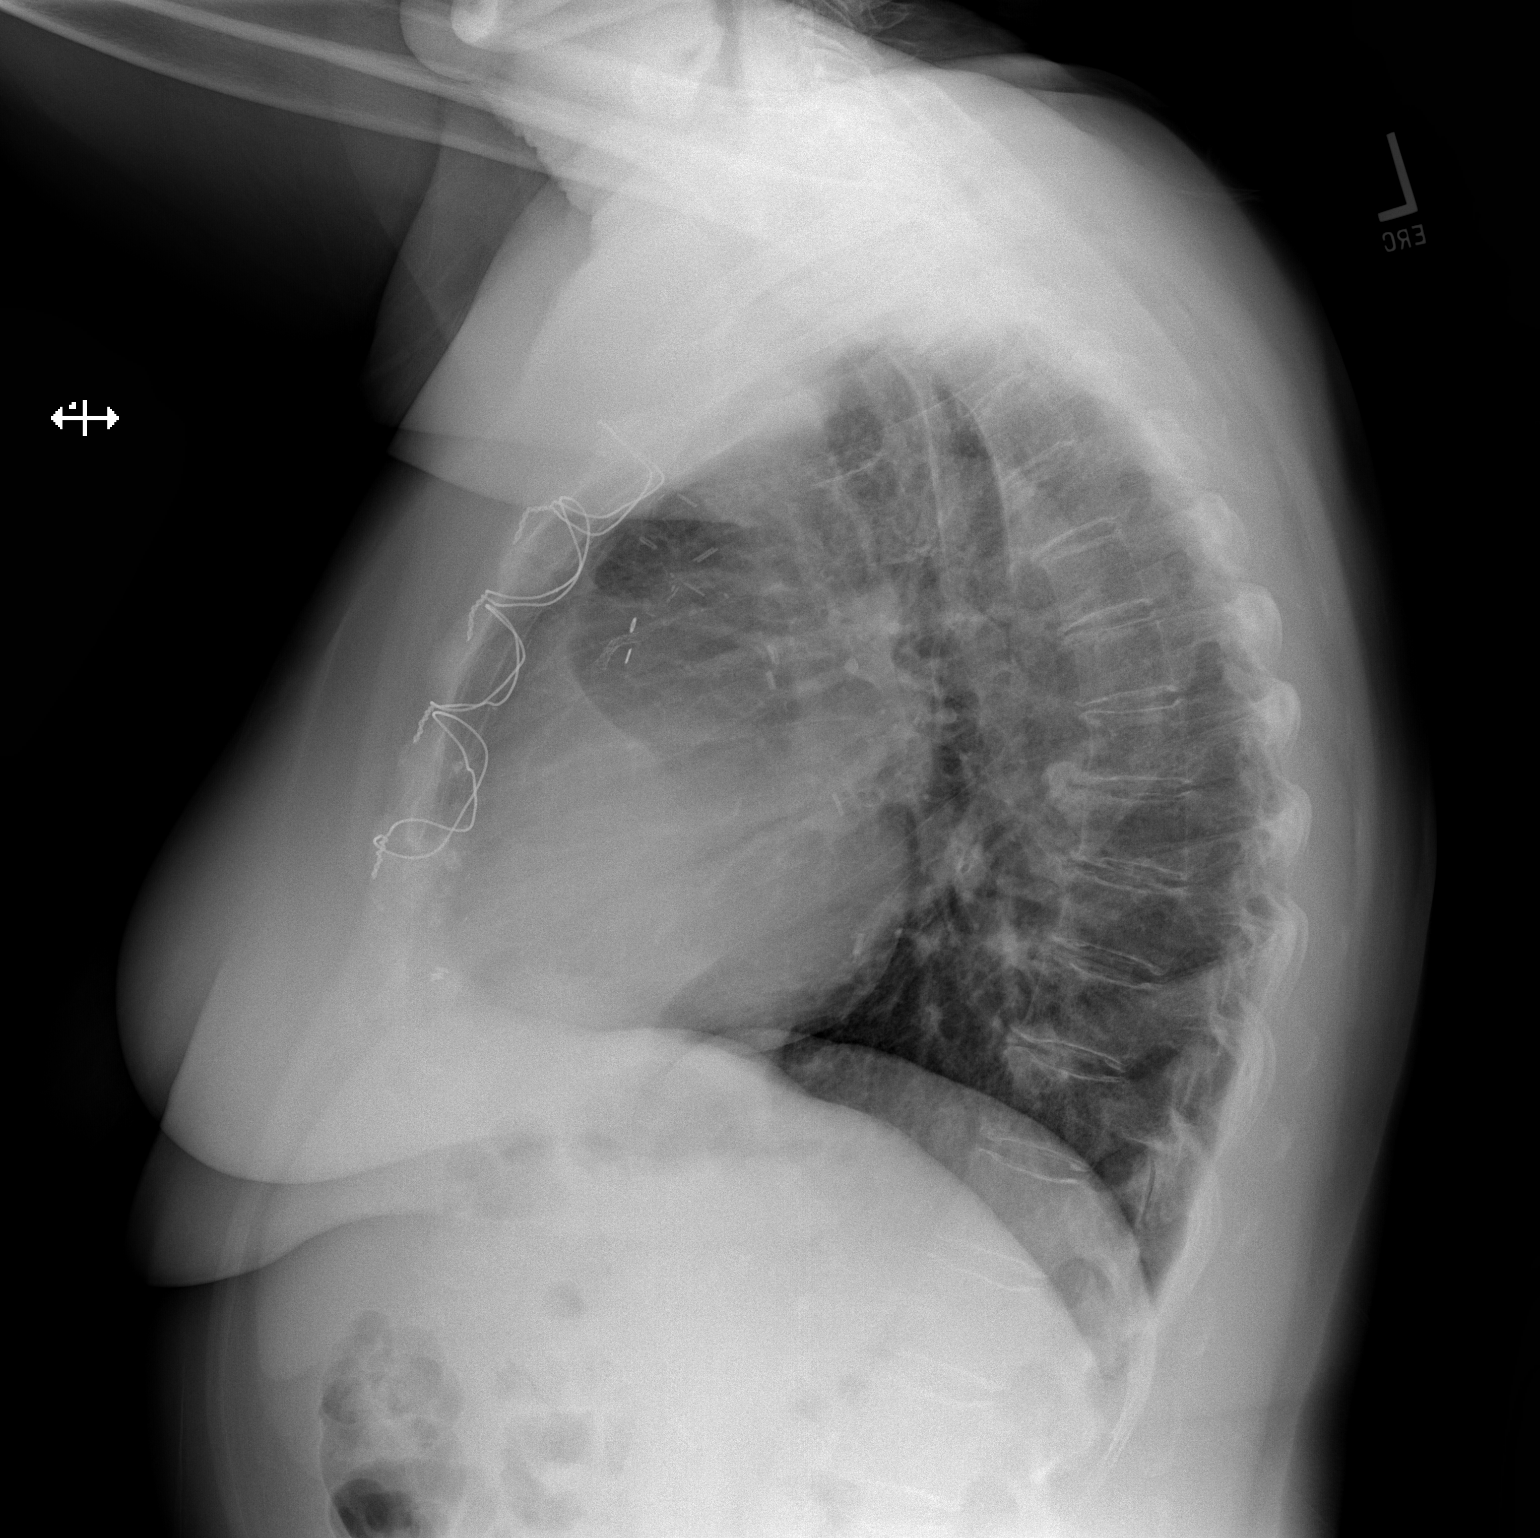

[2 of 2 positions shown; findings below may reference images not displayed]

FINDINGS: The heart and mediastinum are stable. Prior median sternotomy and CABG. No
focal pulmonary opacities. Small round density overlying the lower thoracic
spine on the lateral view is similar to prior and likely related to an
osteophyte.
IMPRESSION: No acute cardiopulmonary disease.

[REDACTED]

## 2014-09-10 IMAGING — CR DG CHEST 2V
1 series · 2 of 2 positions shown · non-contrast
Comparison: none

REASON FOR EXAM: chest pain
COMMENTS:

[Series 1: w chest pa · 0.14mm/px · 2 of 2 slices shown]
[im 1/2]
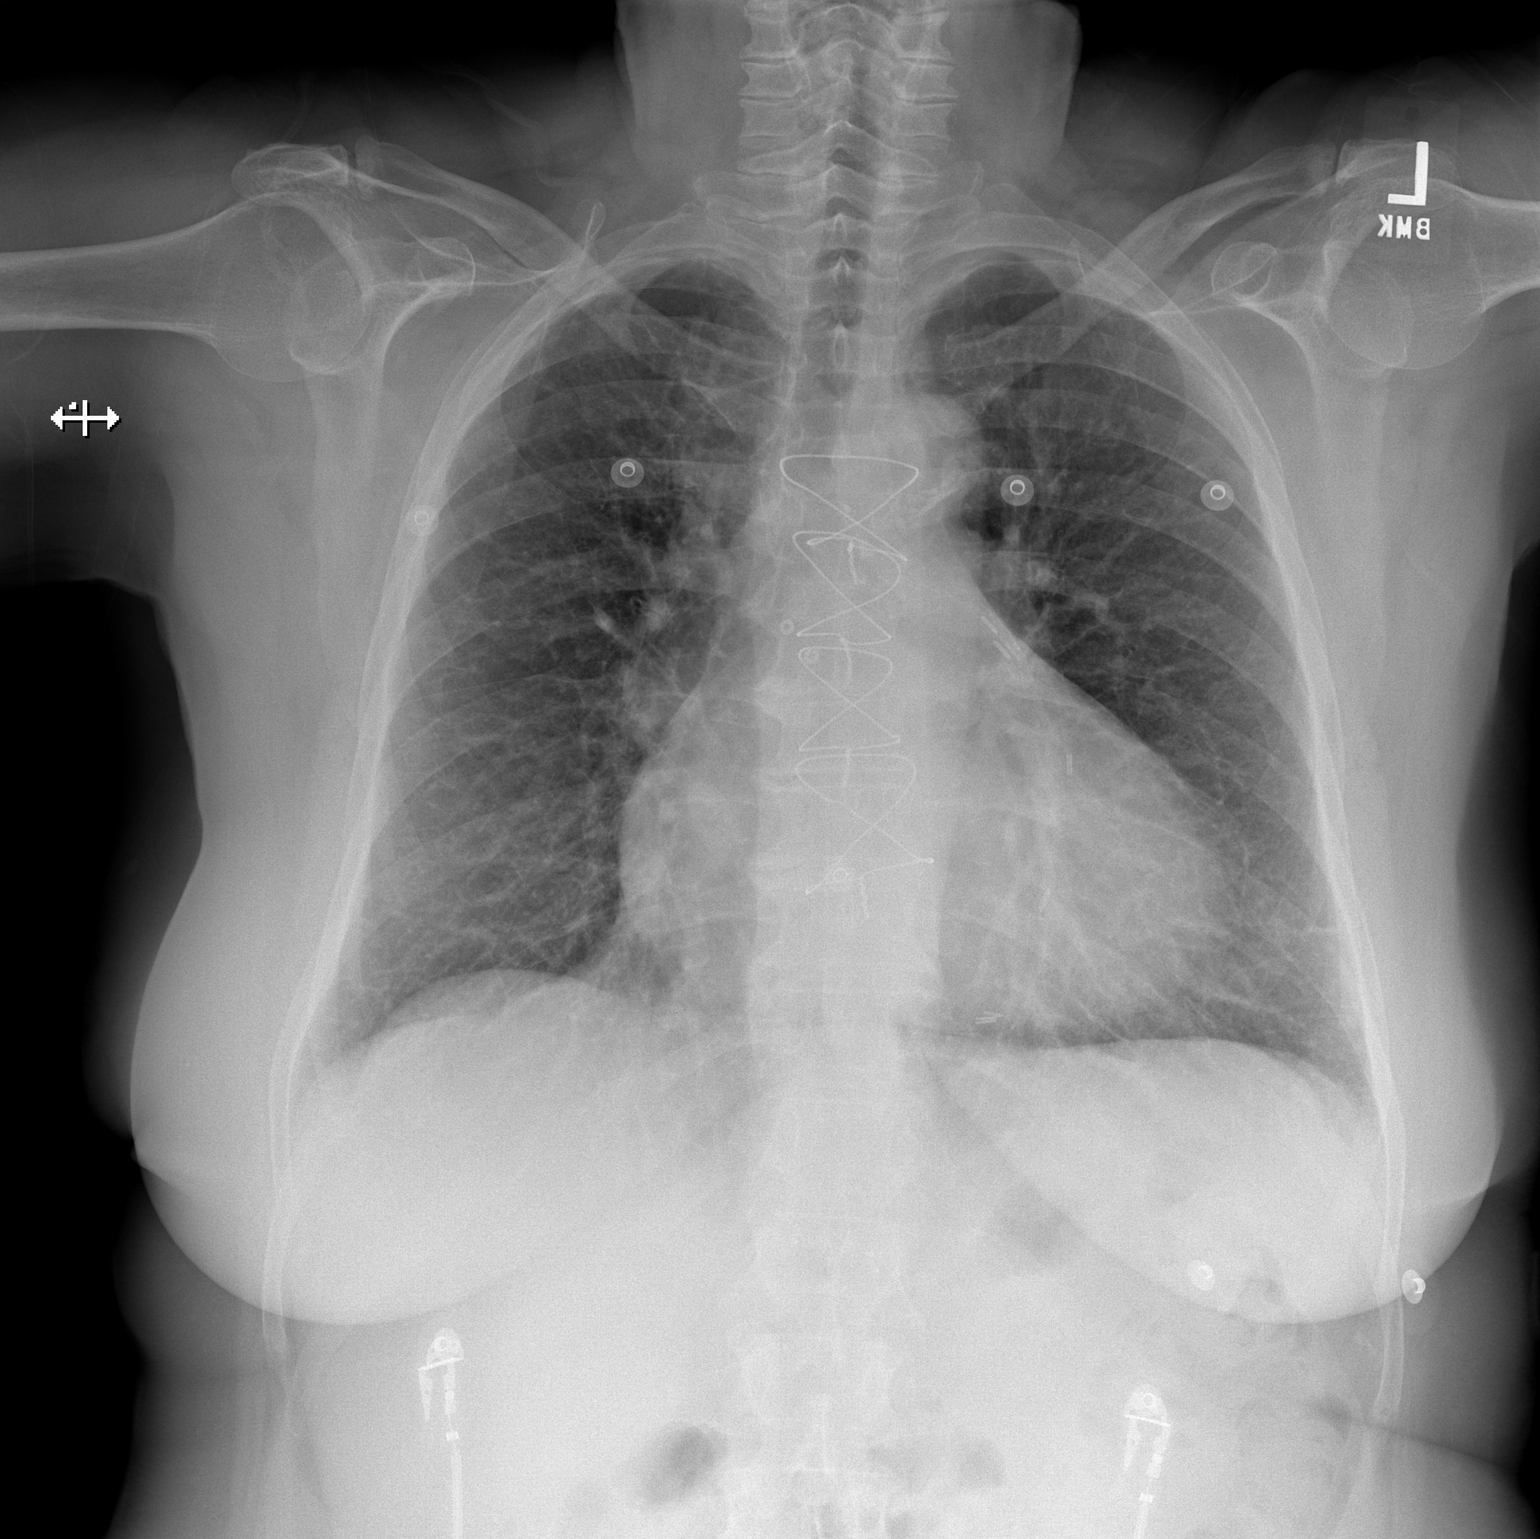
[im 2/2]
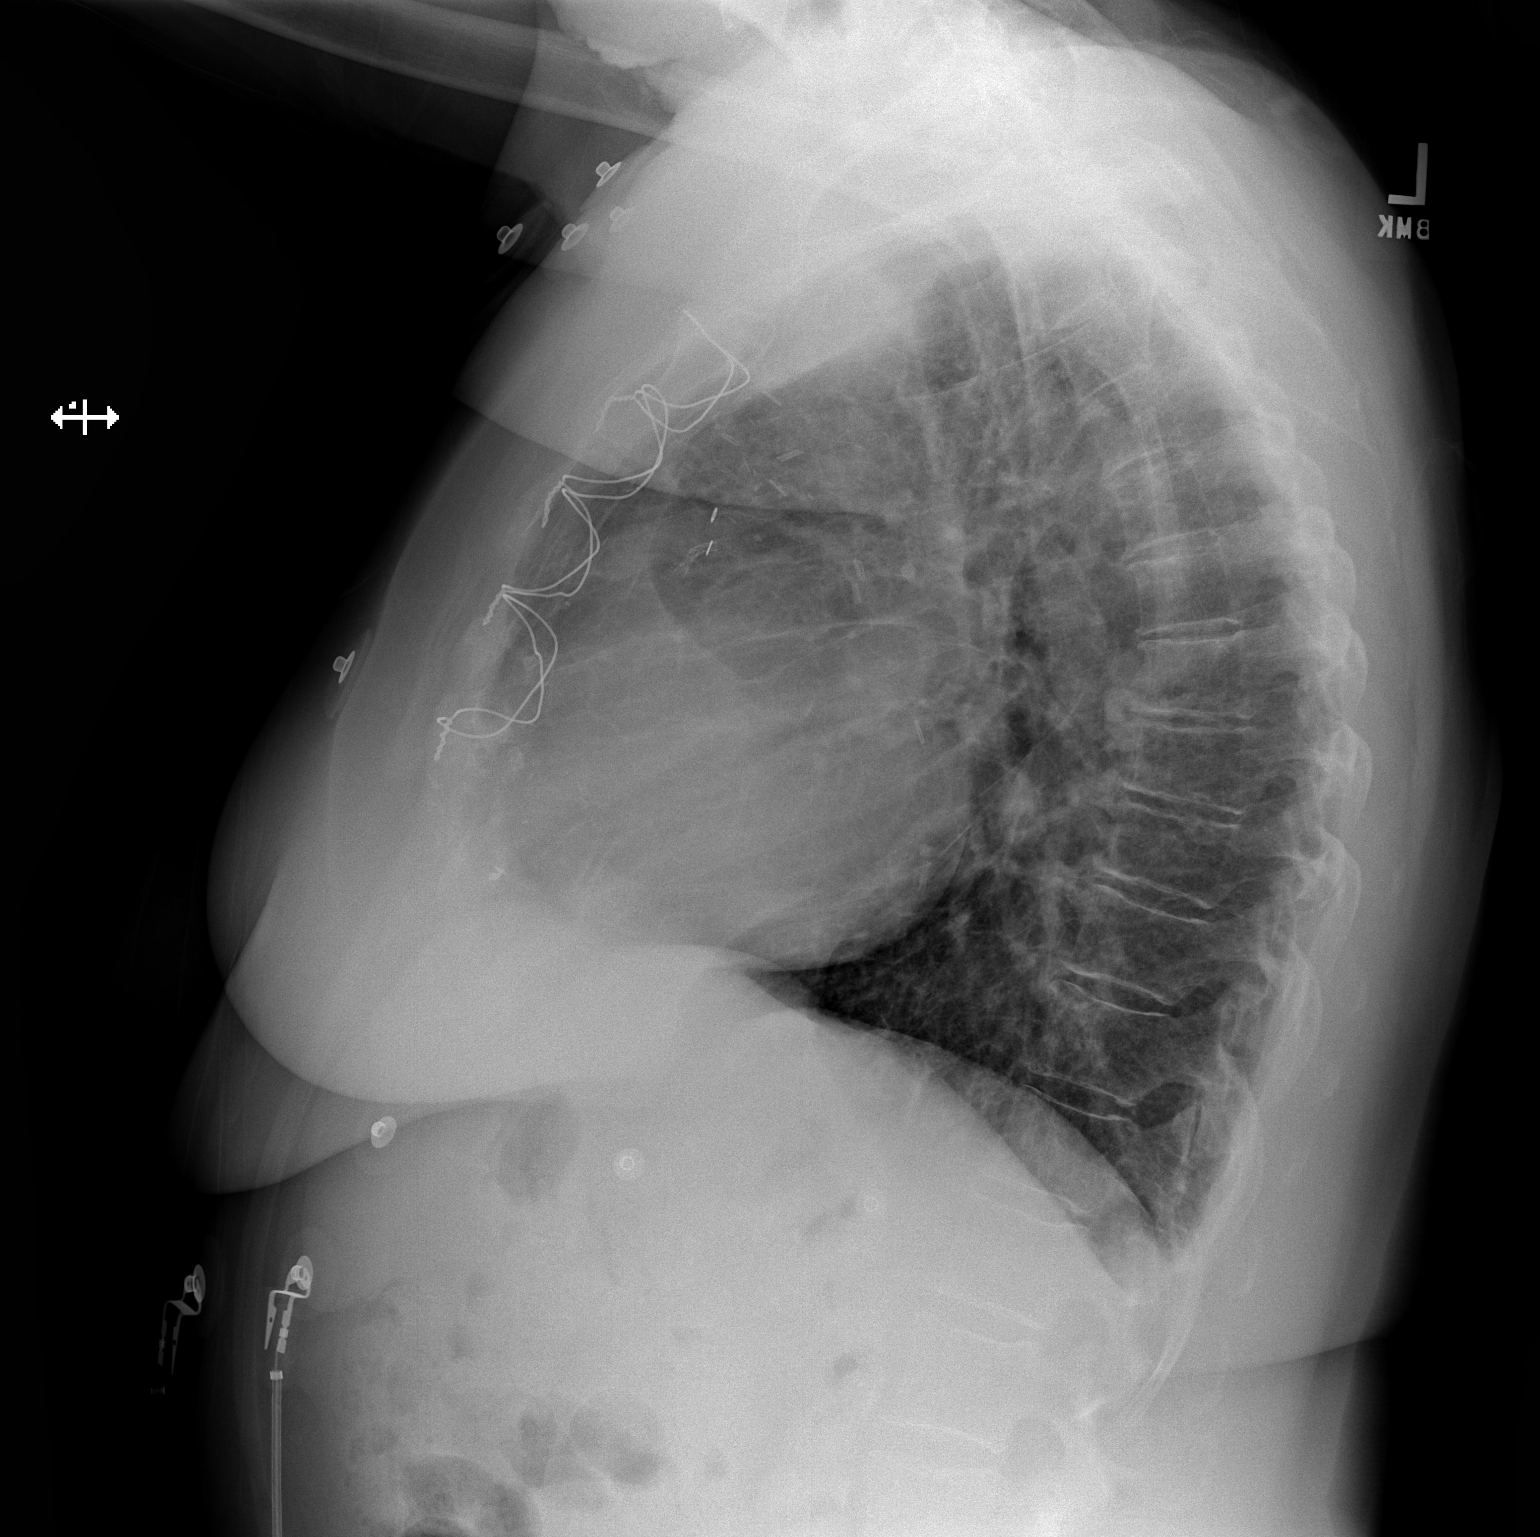

[2 of 2 positions shown; findings below may reference images not displayed]

PROCEDURE:     DXR - DXR CHEST PA (OR AP) AND LATERAL  - June 28, 2012  [DATE]

RESULT:     Comparison is made to images from the study dated 24 May, 2012.
Sternotomy wires are present. The lungs are clear. The heart and pulmonary
vessels are normal. The bony and mediastinal structures are unremarkable.
There is no effusion. There is no pneumothorax or evidence of congestive
failure.
IMPRESSION: No acute cardiopulmonary disease.

[REDACTED]

## 2014-09-17 IMAGING — CR DG CHEST 1V PORT
1 series · 1 of 1 positions shown · non-contrast
Comparison: none

REASON FOR EXAM: Chest pain
COMMENTS:

PROCEDURE:     DXR - DXR PORTABLE CHEST SINGLE VIEW  - July 05, 2012 [DATE]
RESULT:     Comparison: None

[ap]
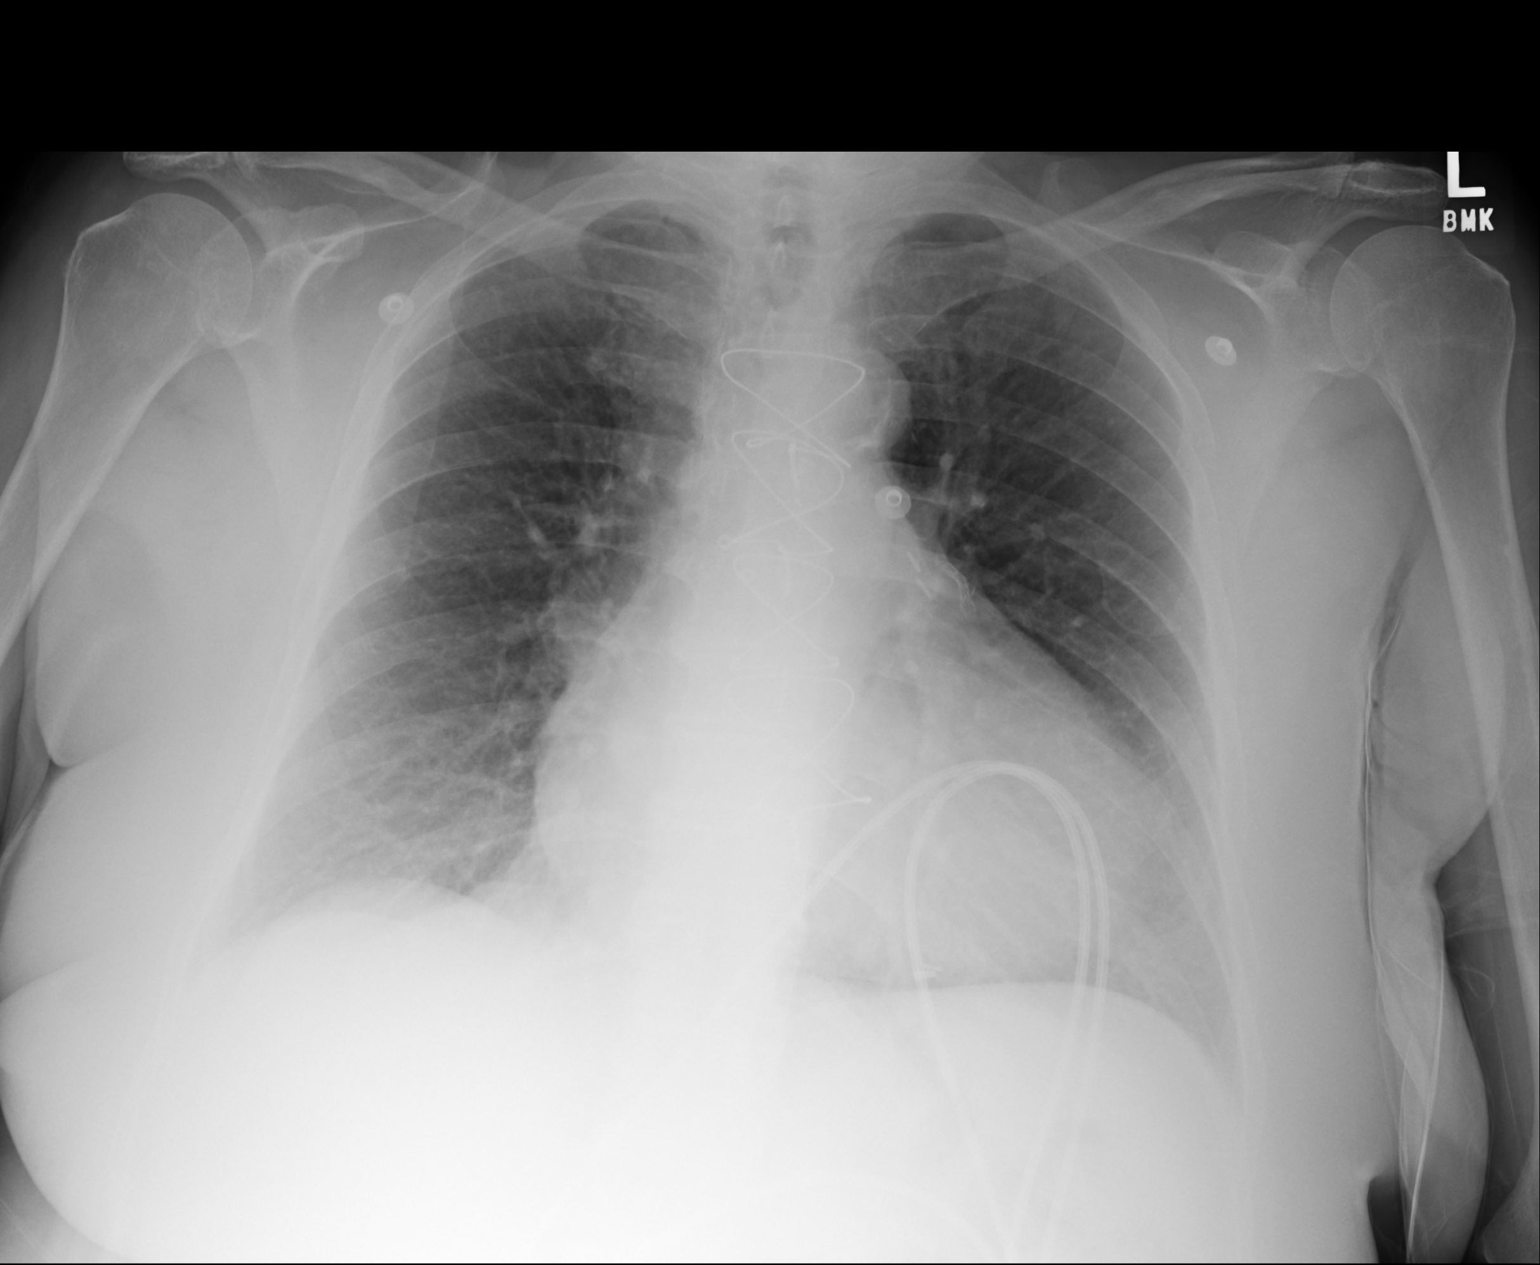

[1 of 1 positions shown; findings below may reference images not displayed]

FINDINGS: Single portable AP chest radiograph is provided.  There is no focal
parenchymal opacity, pleural effusion, or pneumothorax. Normal
cardiomediastinal silhouette. Prior median sternotomy. The osseous
structures are unremarkable.
IMPRESSION: No acute disease of the che[REDACTED]

## 2014-09-27 IMAGING — CT CT ABD-PELV W/ CM
1 of 2 series · 15 of 32 positions shown, 19 images · non-contrast
Comparison: none

REASON FOR EXAM: (1) MID ABD PAIN; (2) ABD PAIN
COMMENTS:

[Series 2: 3mm soft tissue · axial · 0.77mm/px · z∈[-1058,-668]mm · 15 of 142 slices shown, 19 images]
[im 6/142  soft-tissue]
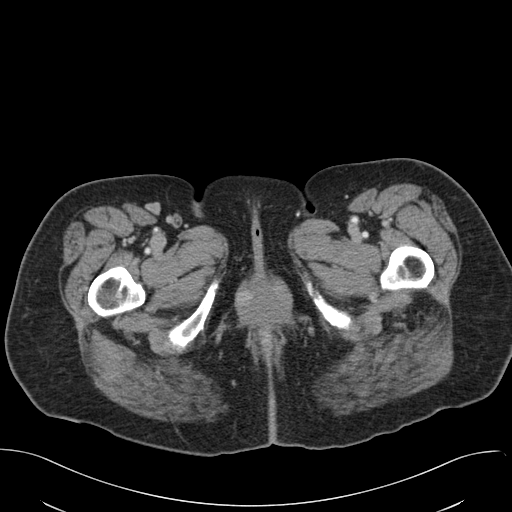
[im 6/142  bone]
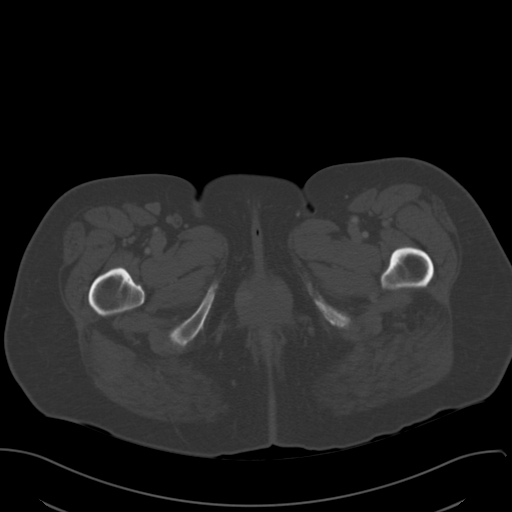
[im 18/142  soft-tissue]
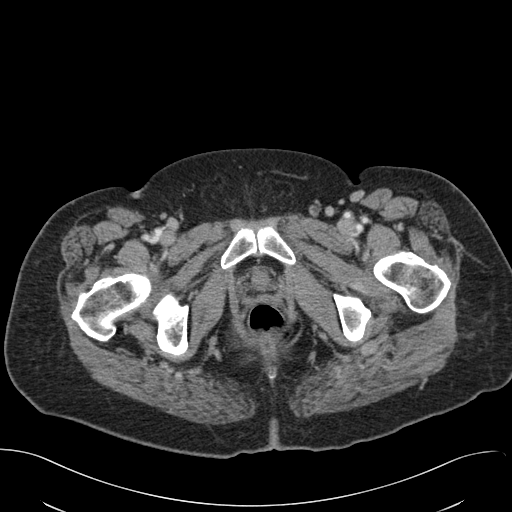
[im 30/142  soft-tissue]
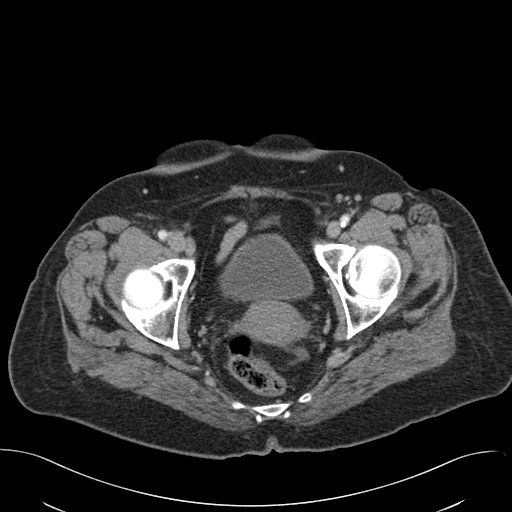
[im 42/142  soft-tissue]
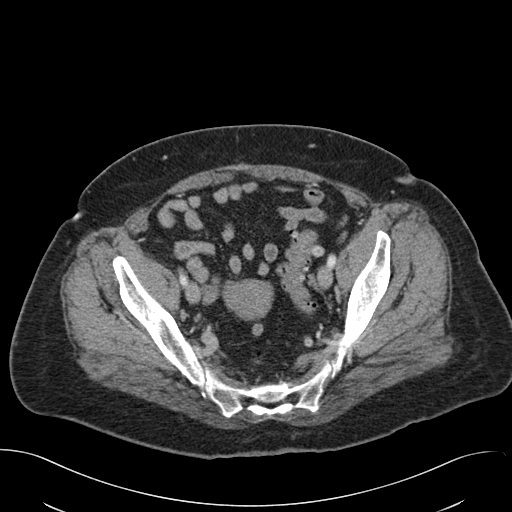
[im 48/142  soft-tissue]
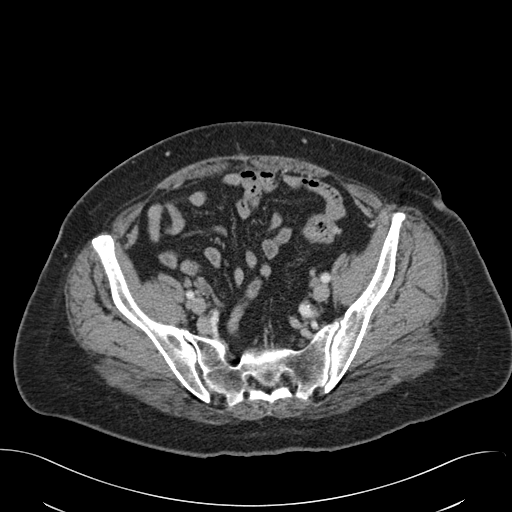
[im 59/142  soft-tissue]
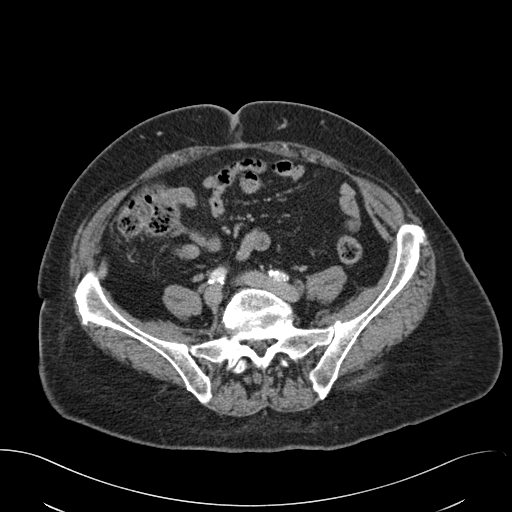
[im 71/142  soft-tissue]
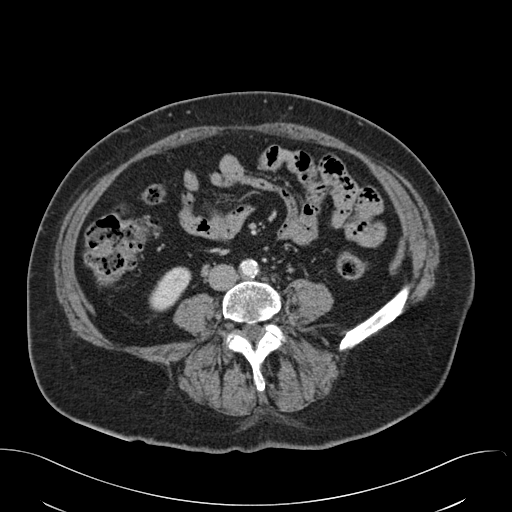
[im 83/142  soft-tissue]
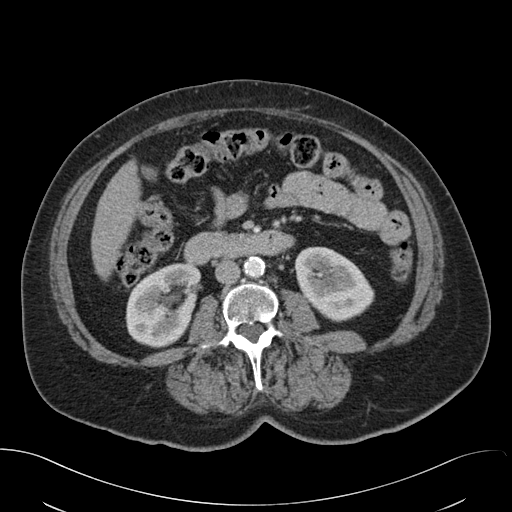
[im 95/142  soft-tissue]
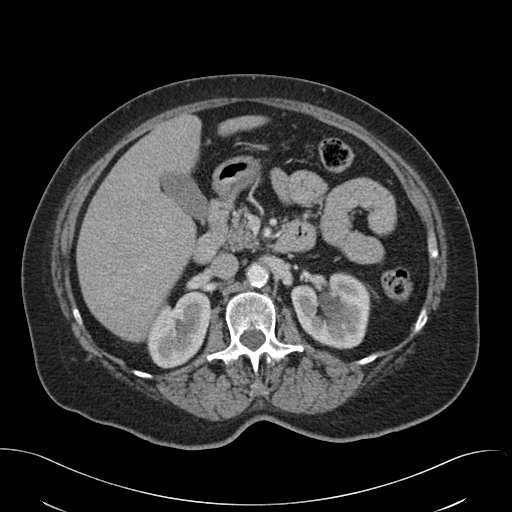
[im 95/142  bone]
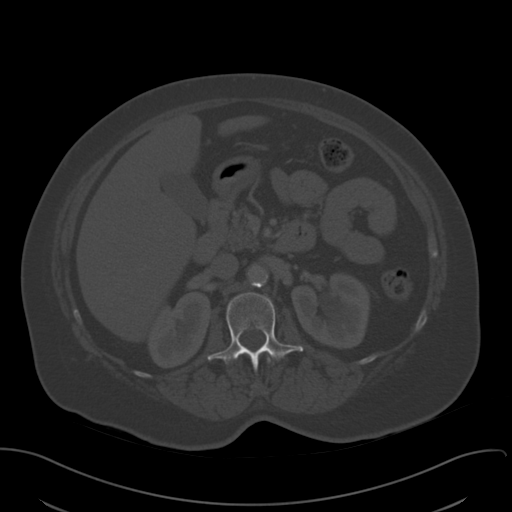
[im 100/142  soft-tissue]
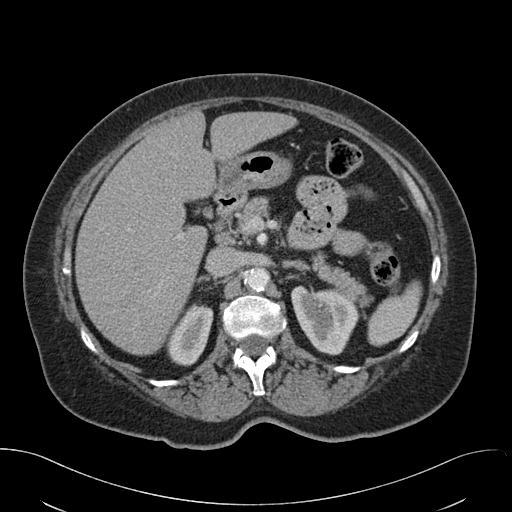
[im 112/142  soft-tissue]
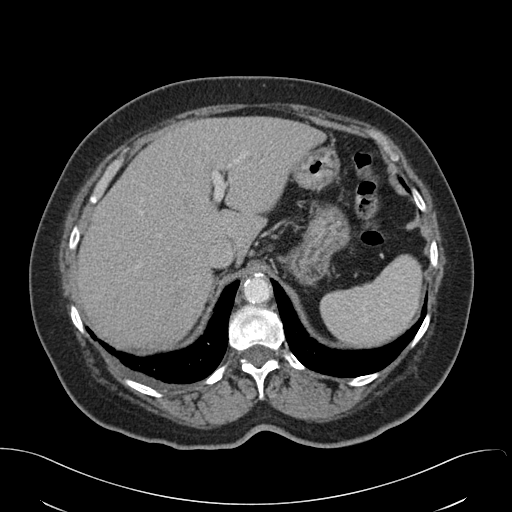
[im 118/142  lung]
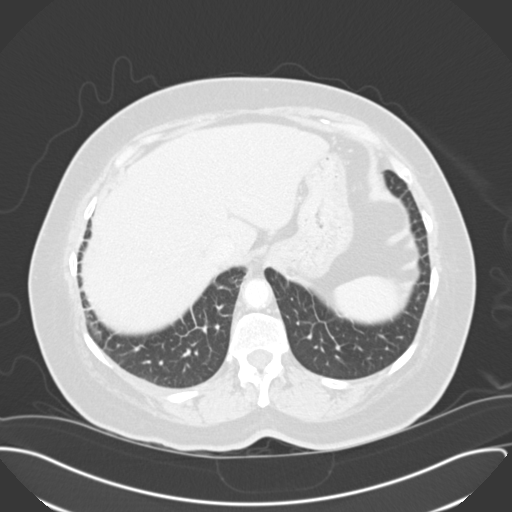
[im 124/142  soft-tissue]
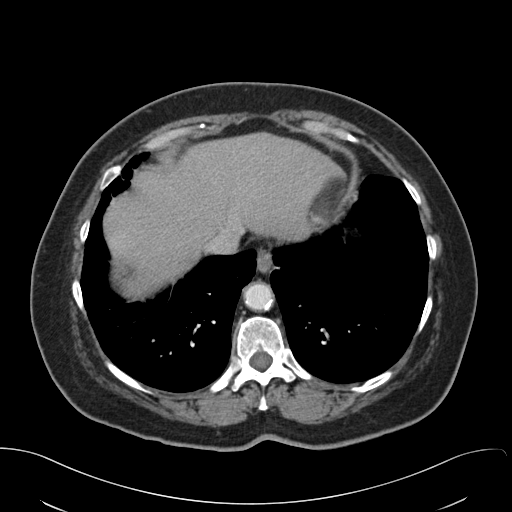
[im 124/142  lung]
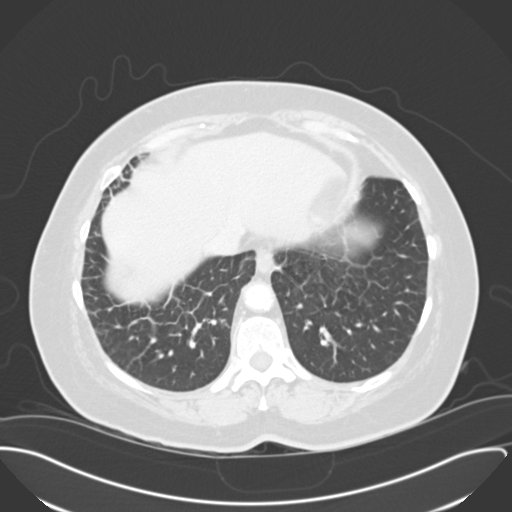
[im 130/142  lung]
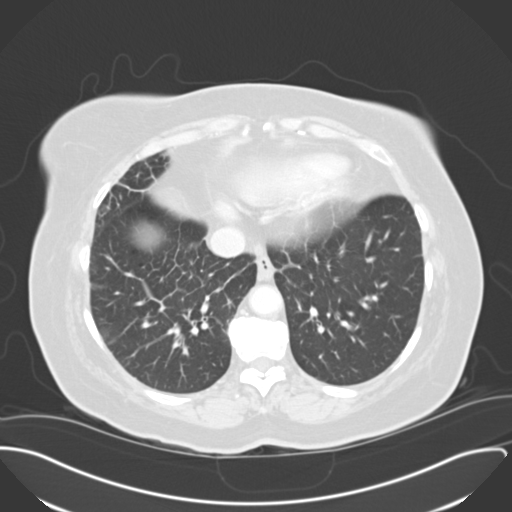
[im 136/142  soft-tissue]
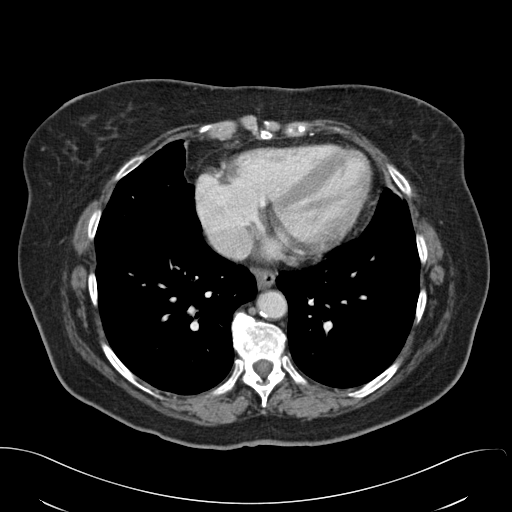
[im 136/142  lung]
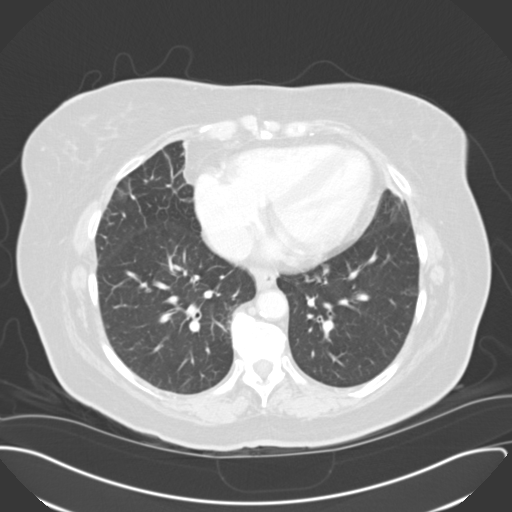

[15 of 32 positions shown; findings below may reference images not displayed]

PROCEDURE:     CT  - CT ABDOMEN / PELVIS  W  - July 15, 2012 [DATE]

RESULT:     CT of the abdomen and pelvis is performed with 100 mL of
[id number] iodinated intravenous contrast. No oral contrast is utilized.
Images are reconstructed at 3.0 mm slice thickness in the axial plane.
Comparison is made images 28 August, 2010.

Atherosclerotic calcification is present in the aorta and its branches.
There is no evidence of aneurysm. The kidneys show no obstruction and appear
to enhance symmetrically without a discrete mass or evidence of stones.
There is a small low-attenuation area along the cortex of the lower pole the
left kidney which may represent a tiny cyst but is too small fracture
characterization. No radiopaque gallstones are evident. The liver and spleen
appear unremarkable. The pancreas demonstrates no discrete mass or ductal
dilation. In there is no evidence of adenopathy. No abnormal bowel
distention is appreciated. The appendix is not identified. There is no
evidence to suggest acute appendicitis. Scattered colonic diverticulosis is
present especially in the sigmoid colon. The uterus and adnexal structures
appear unremarkable. The urinary bladder contains a small volume of urine.
The bony structures appear within normal limits. No ascites, abscess or
pneumoperitoneum is seen. The lung bases show some respiratory motion
artifact with mild emphysematous disease and some septal thickening. No
pneumonia or effusion is seen.
IMPRESSION: 1. No CT evidence of acute abdominal or pelvic abnormality.
2. Colonic diverticulosis without evidence of acute diverticulitis. No bowel
obstruction or perforation evident.
3. Atherosclerotic disease present.
4. To chronic lung disease appears present in the lung bases.

[REDACTED]

## 2014-10-04 IMAGING — CR DG CHEST 2V
1 series · 2 of 2 positions shown · non-contrast
Comparison: none

REASON FOR EXAM: CP
COMMENTS:

[Series 1: w chest pa · 0.14mm/px · 2 of 2 slices shown]
[im 1/2]
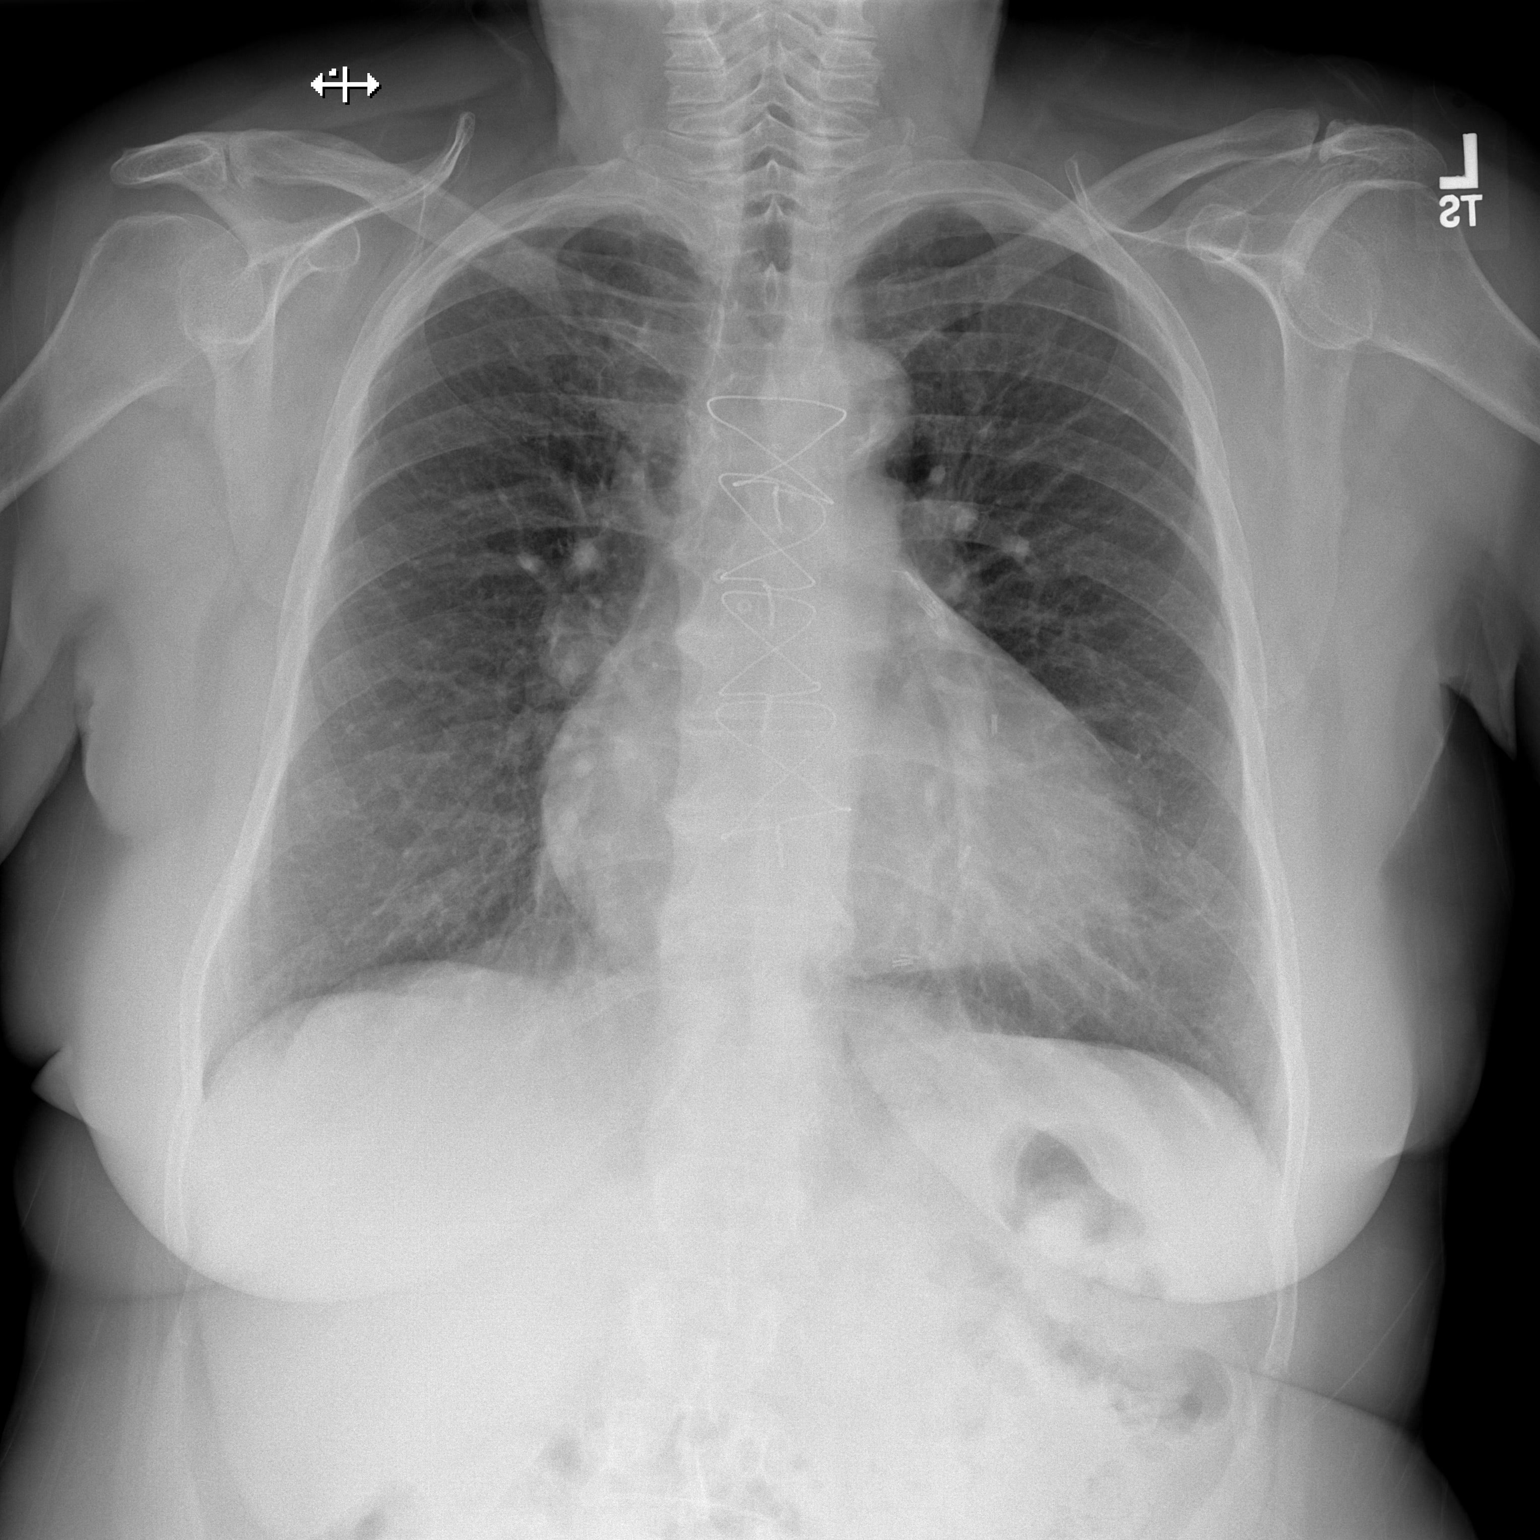
[im 2/2]
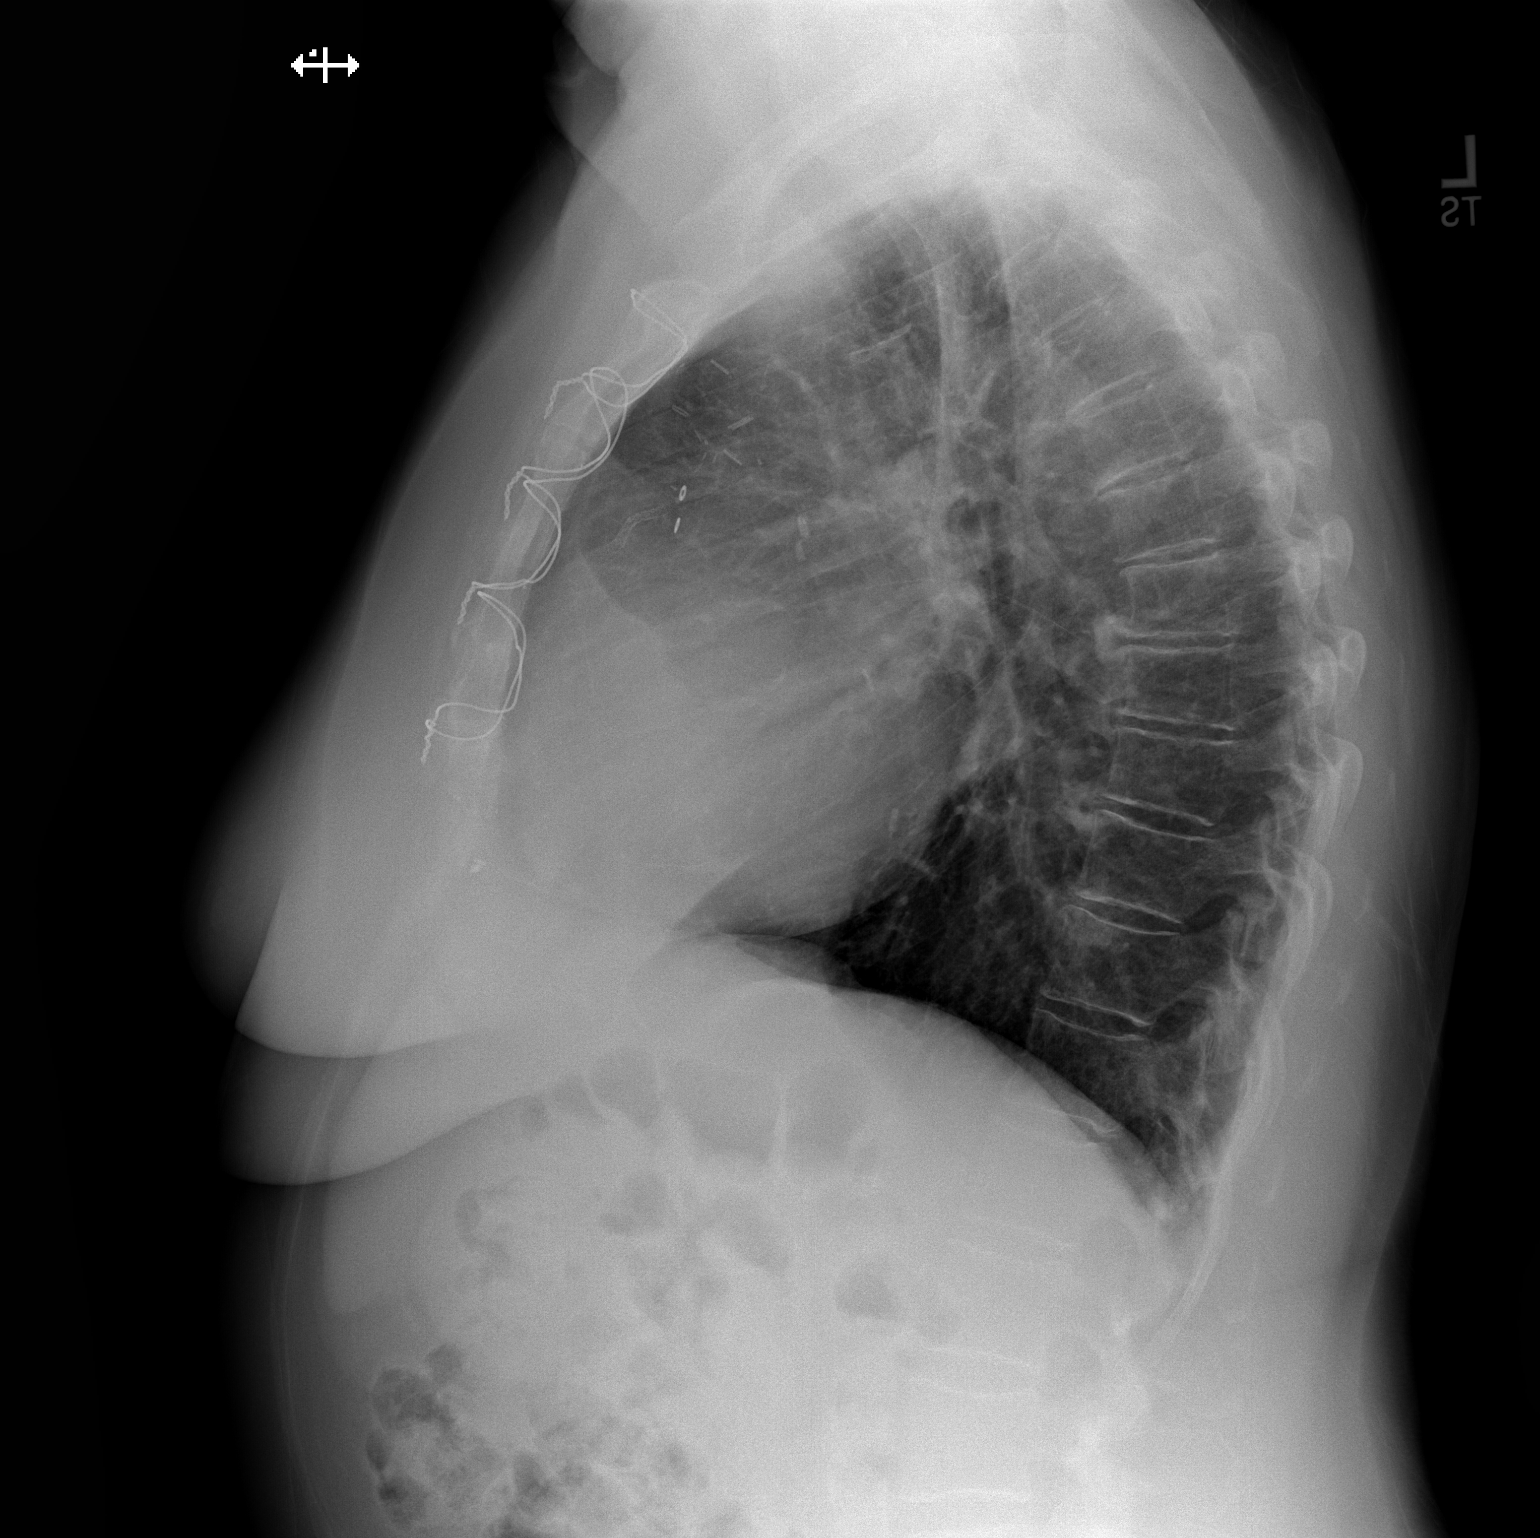

[2 of 2 positions shown; findings below may reference images not displayed]

PROCEDURE:     DXR - DXR CHEST PA (OR AP) AND LATERAL  - July 22, 2012 [DATE]

RESULT:     Comparison is made to the study July 05, 2012.

The cardiac silhouette is mildly enlarged though stable. The central
pulmonary vascularity is prominent. The interstitial markings of both lungs
are increased. There is no pleural effusion or alveolar infiltrate. The bony
thorax is normal in appearance.
IMPRESSION: The findings suggest interstitial edema likely of cardiac
cause. There is no alveolar pneumonia.

[REDACTED]

## 2014-12-08 IMAGING — CR DG CHEST 2V
1 series · 3 of 3 positions shown · non-contrast
Comparison: none

REASON FOR EXAM: cp and sob
COMMENTS:

[Series 1: w chest pa · 0.14mm/px · 3 of 3 slices shown]
[im 1/3]
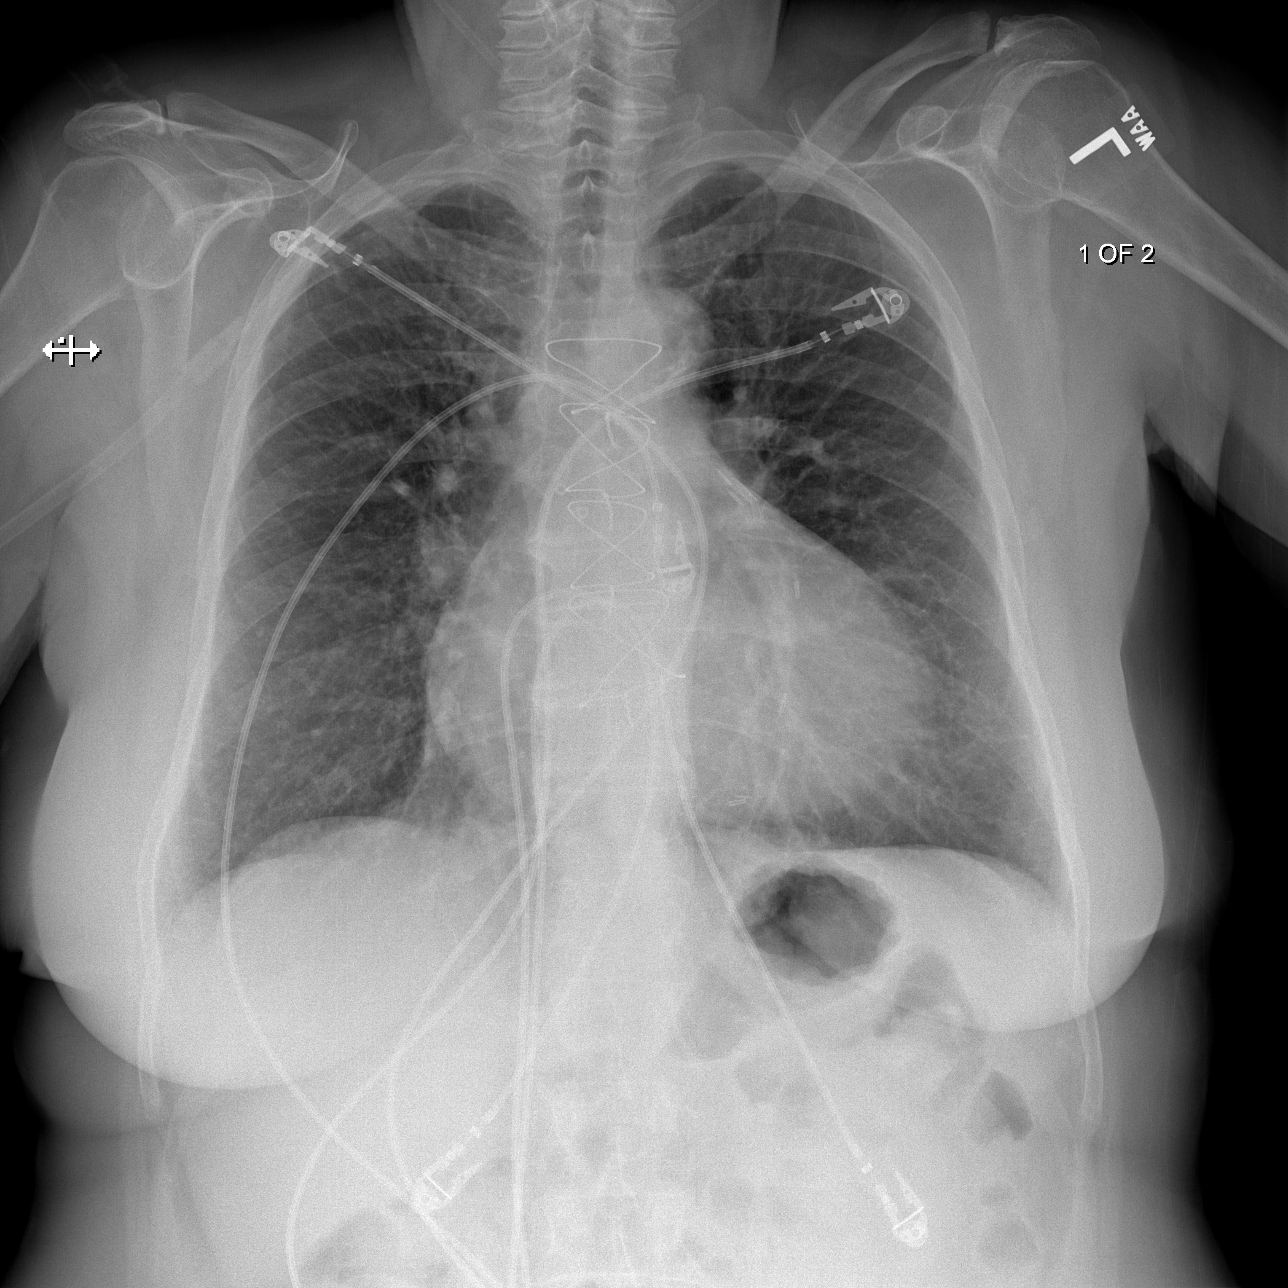
[im 2/3]
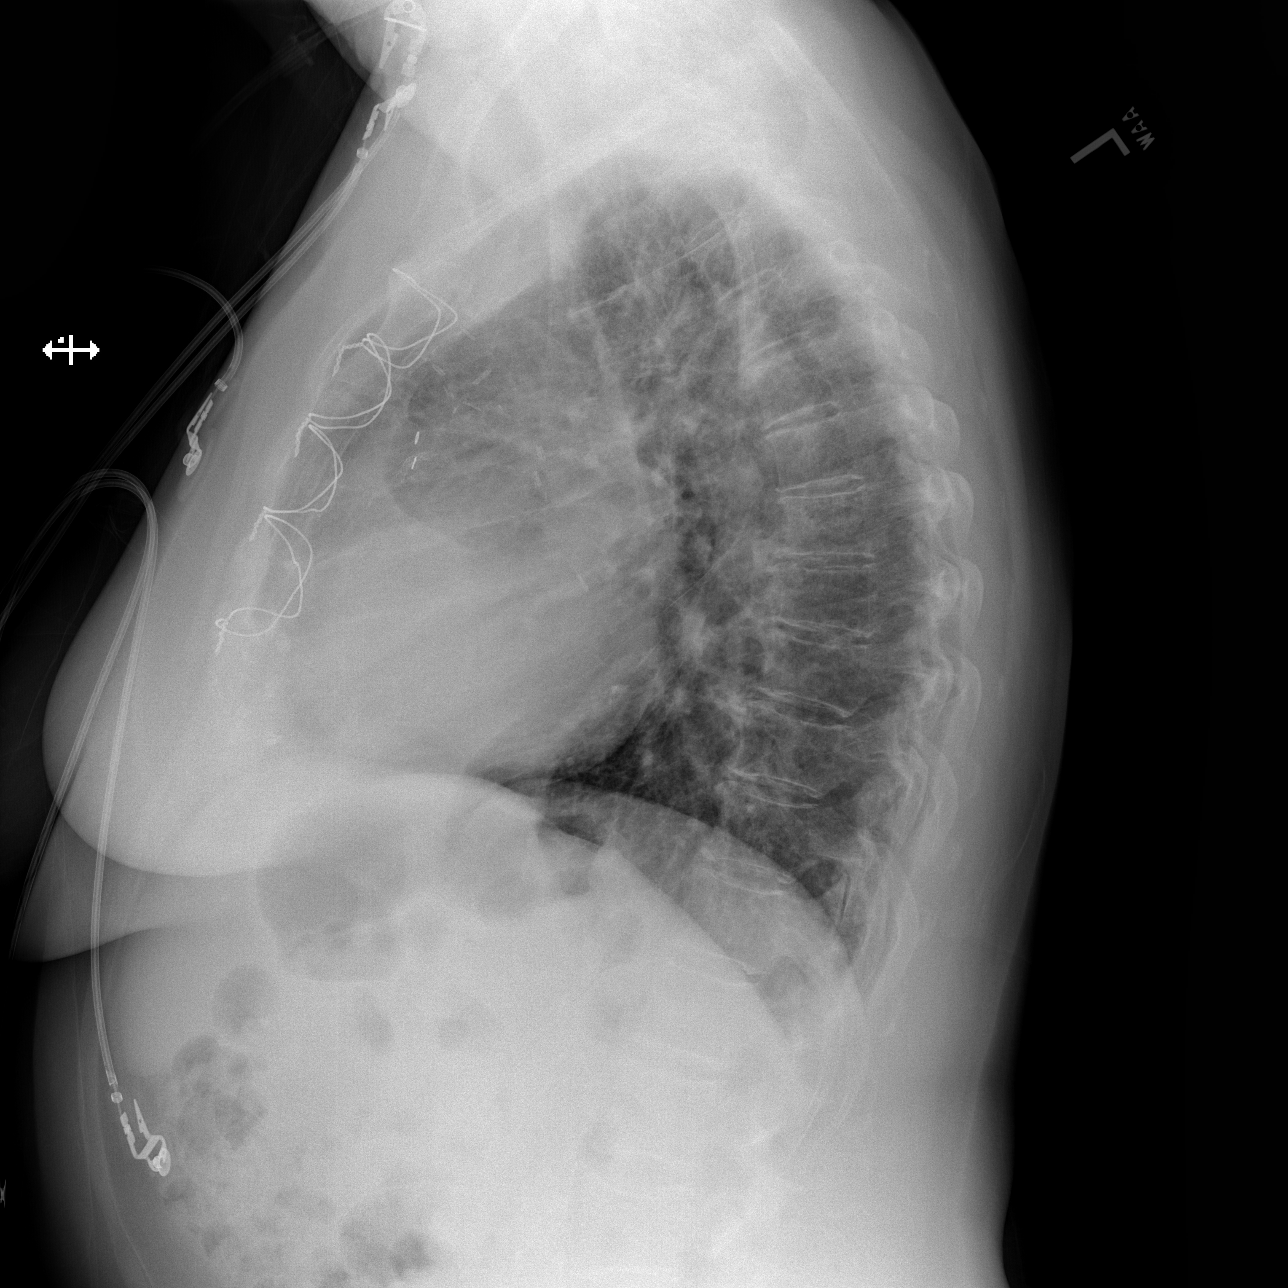
[im 3/3]
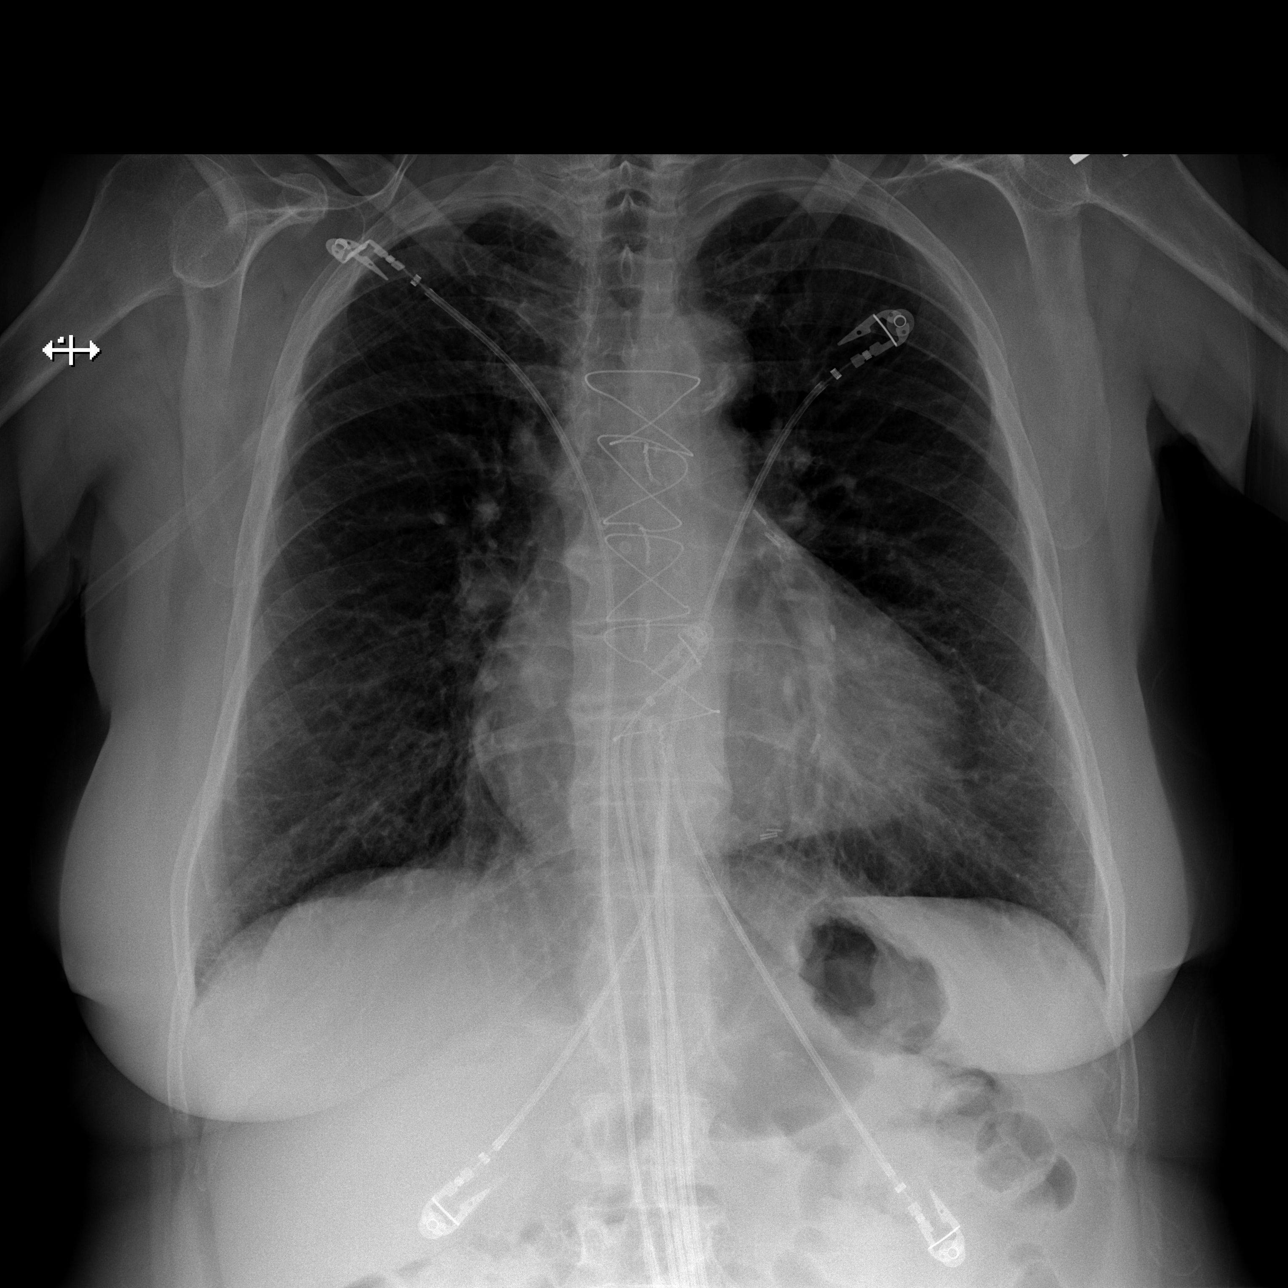

[3 of 3 positions shown; findings below may reference images not displayed]

PROCEDURE:     DXR - DXR CHEST PA (OR AP) AND LATERAL  - September 25, 2012 [DATE]

RESULT:     Comparison is made to the study July 22, 2012.

The lungs are mildly hyperinflated. There is no focal infiltrate.
Interstitial markings are mildly increased but this is not new. The cardiac
silhouette is enlarged and the pulmonary vascularity is prominent. The
patient has undergone previous CABG.
IMPRESSION: The findings suggest CHF with mild interstitial edema.
There is no focal pneumonia nor evidence of a pleural effusion.

[REDACTED]

## 2014-12-08 IMAGING — CT CT HEAD WITHOUT CONTRAST
1 series · 16 of 30 positions shown, 20 images · non-contrast
Comparison: none

REASON FOR EXAM: severe HA
COMMENTS:

PROCEDURE:     CT  - CT HEAD WITHOUT CONTRAST  - September 25, 2012 [DATE]
RESULT:     History: Severe headache.
Comparison Study: Prior CT of 05/24/2012.

[Series 2: soft tissue · axial · 0.37mm/px · z∈[-116,+20]mm · 16 of 30 slices shown, 20 images]
[im 2/30  brain]
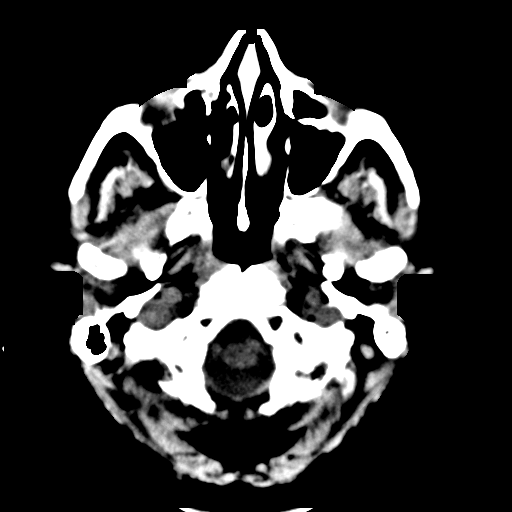
[im 2/30  bone]
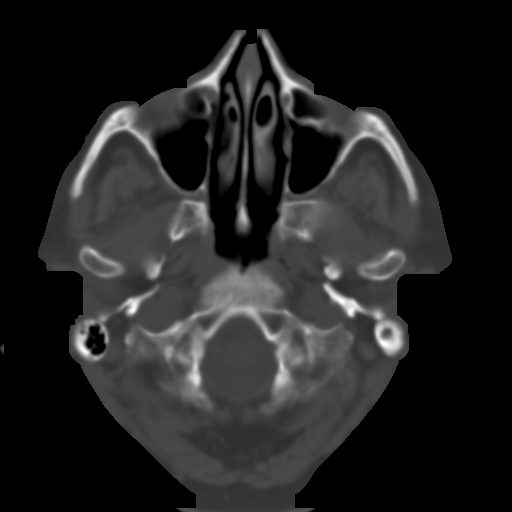
[im 4/30  brain]
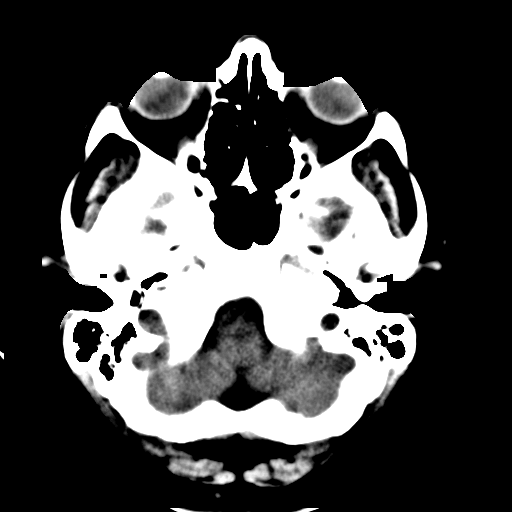
[im 6/30  brain]
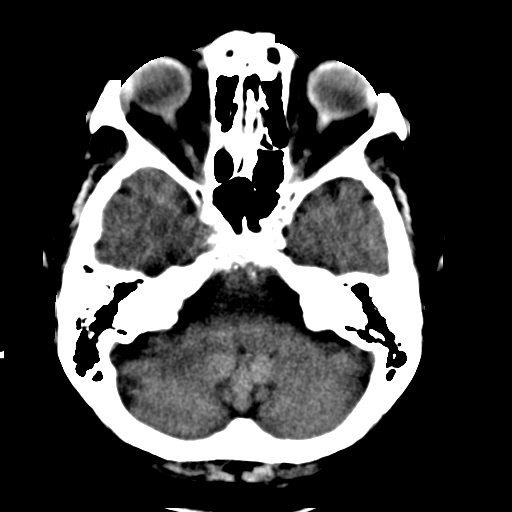
[im 8/30  brain]
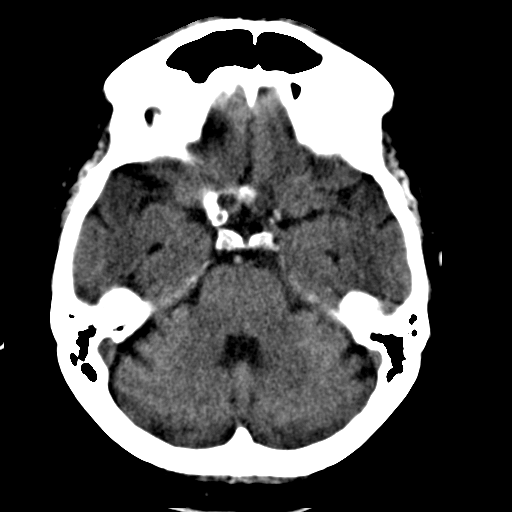
[im 9/30  brain]
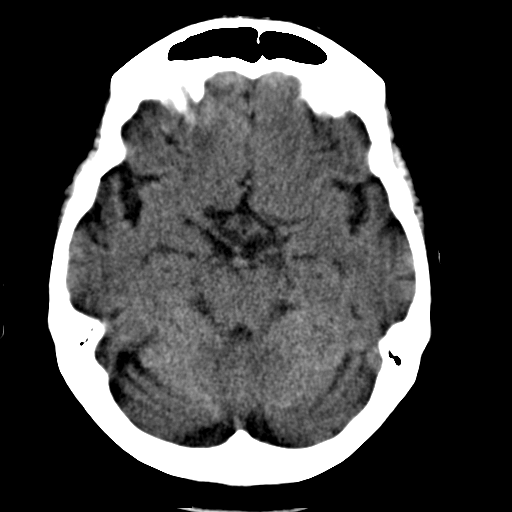
[im 9/30  bone]
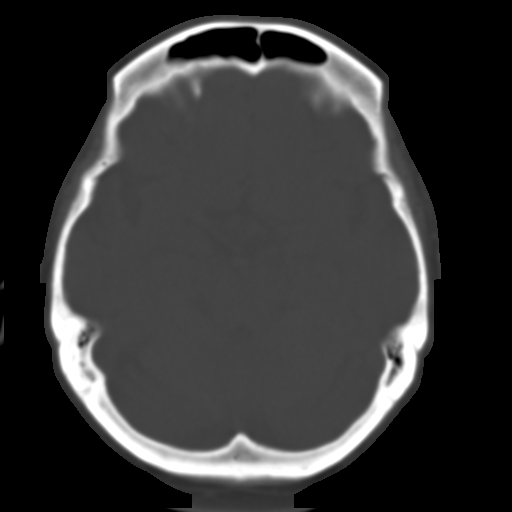
[im 11/30  brain]
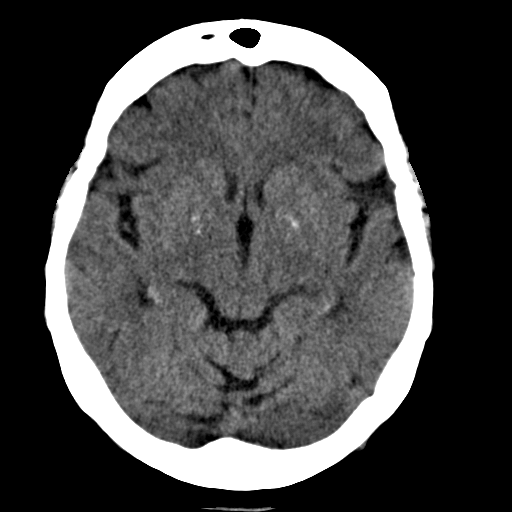
[im 13/30  brain]
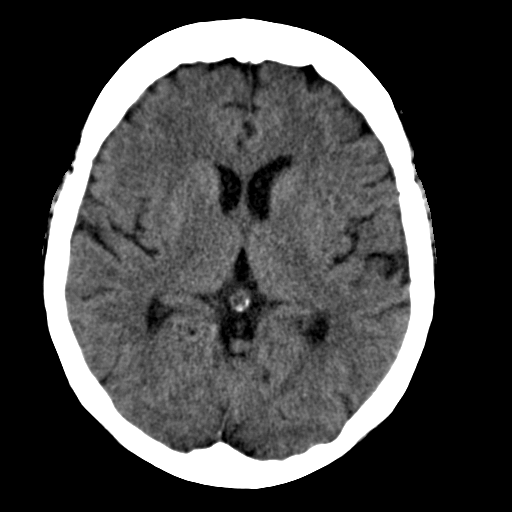
[im 15/30  brain]
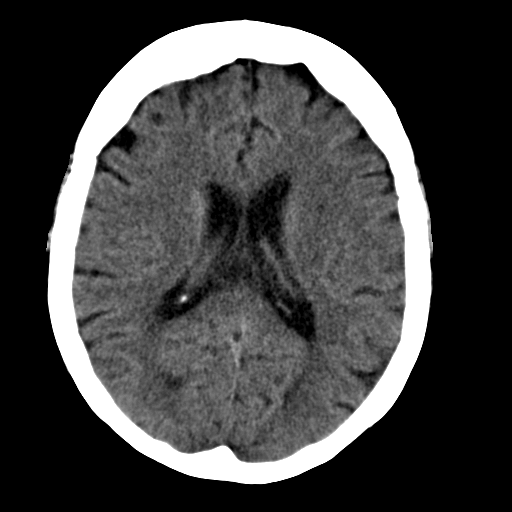
[im 16/30  brain]
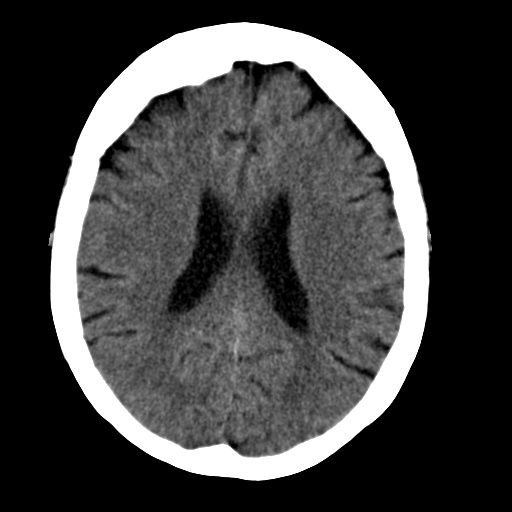
[im 16/30  bone]
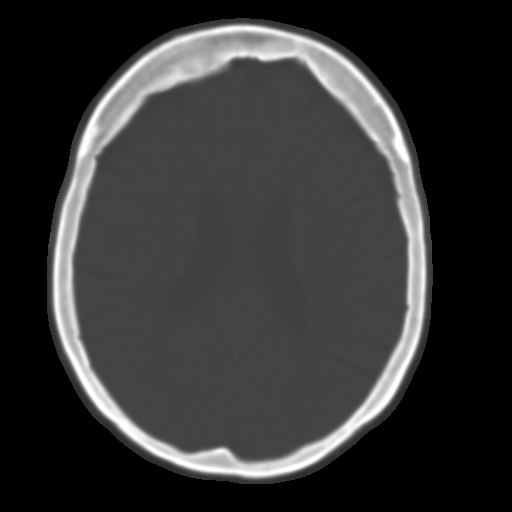
[im 18/30  brain]
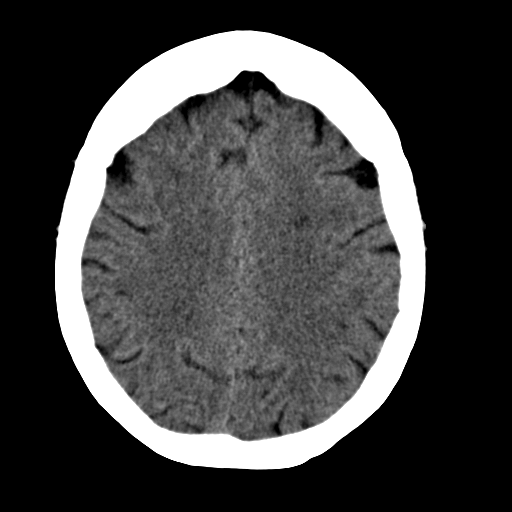
[im 20/30  brain]
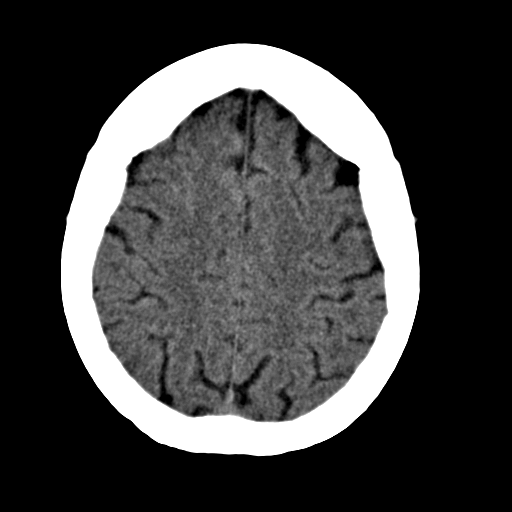
[im 22/30  brain]
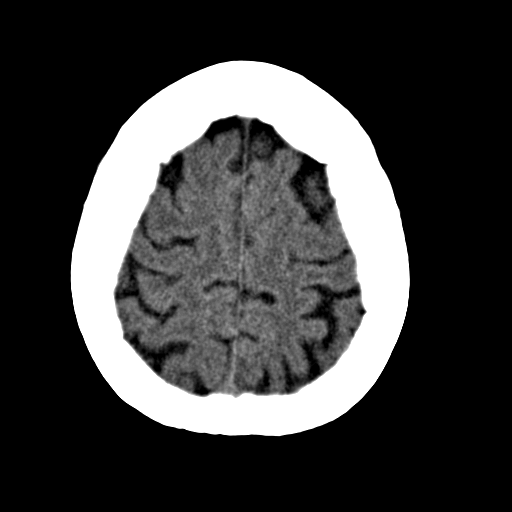
[im 23/30  brain]
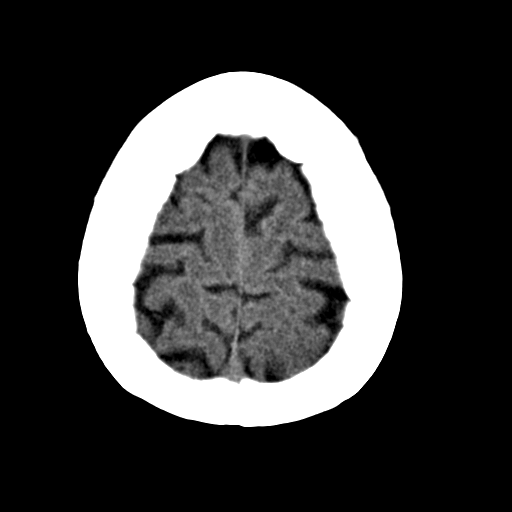
[im 23/30  bone]
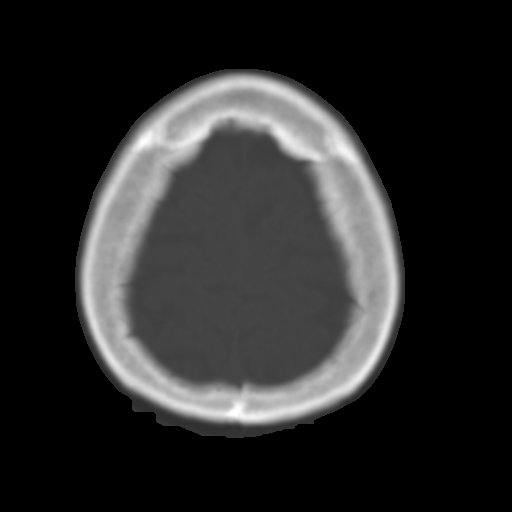
[im 25/30  brain]
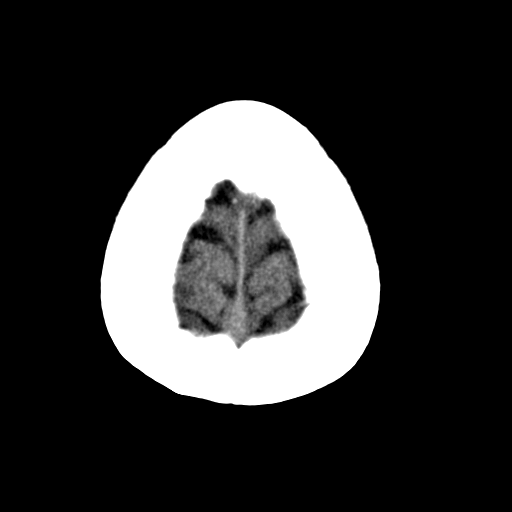
[im 27/30  brain]
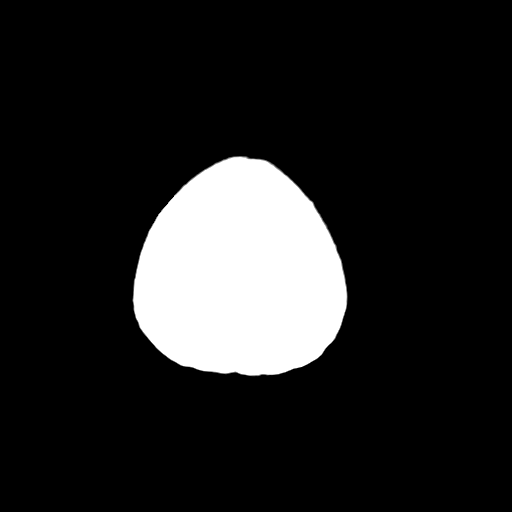
[im 29/30  brain]
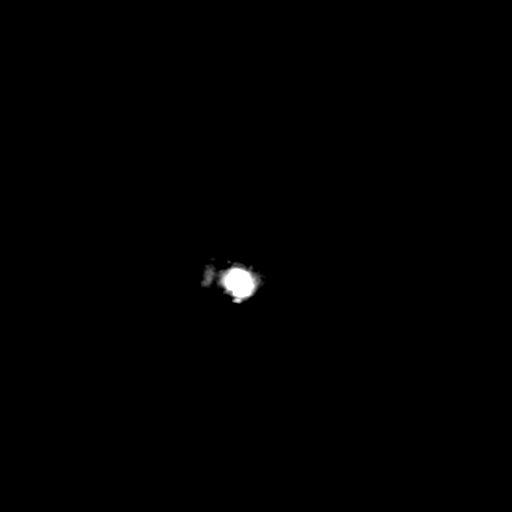

[16 of 30 positions shown; findings below may reference images not displayed]

FINDINGS: No mass. No hydrocephalus. No hemorrhage. Basal ganglia
calcification present. No acute bony abnormality.
IMPRESSION: No acute abnormality.

## 2014-12-23 IMAGING — US US EXTREM LOW VENOUS*L*
1 series · 14 of 24 positions shown · non-contrast
Comparison: none

REASON FOR EXAM: swelling lle
COMMENTS:

[Series 1: us extrem low venous*left* · 0.10mm/px · 14 of 24 slices shown]
[im 1/24]
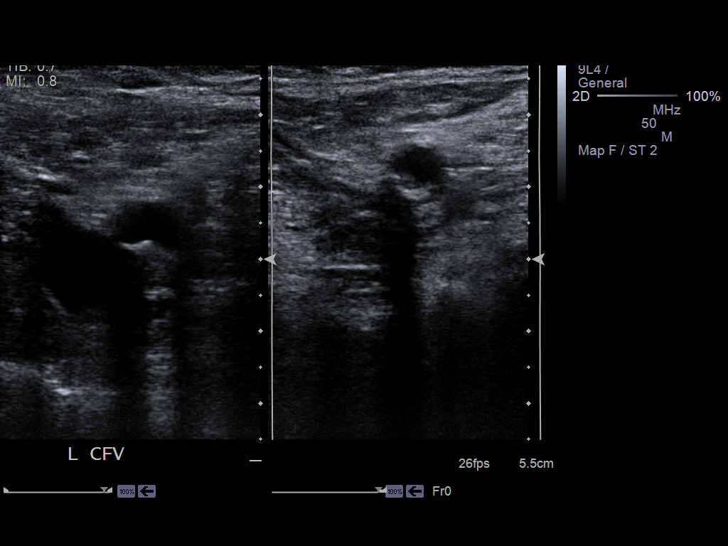
[im 3/24]
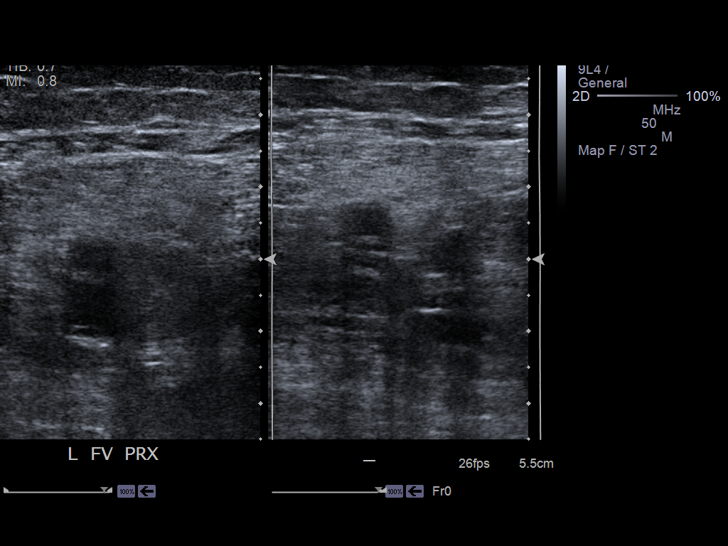
[im 5/24]
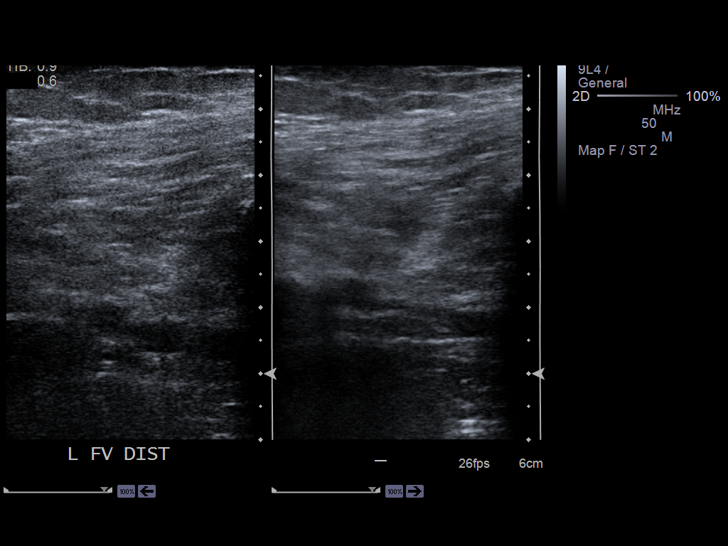
[im 7/24]
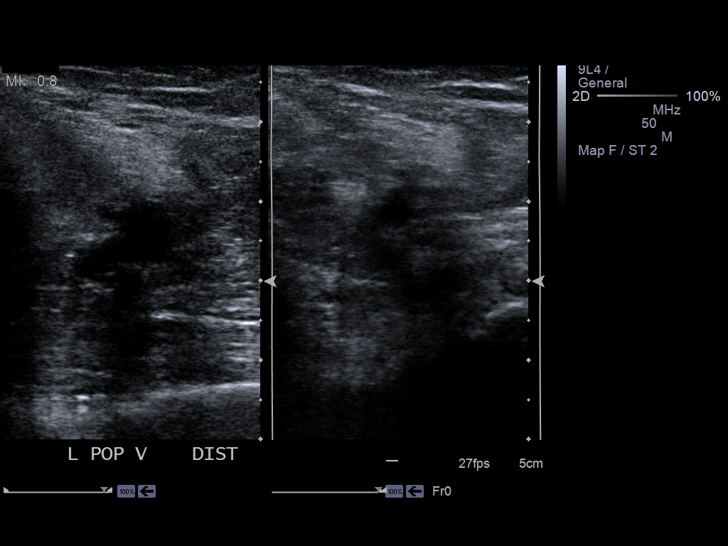
[im 8/24]
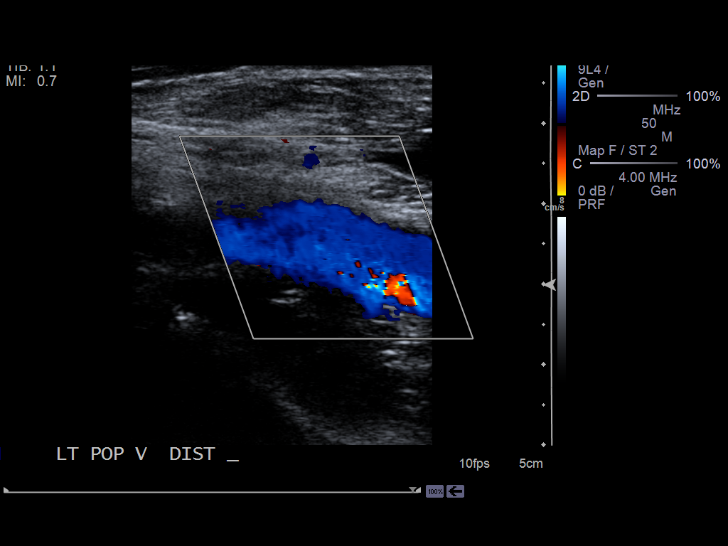
[im 10/24]
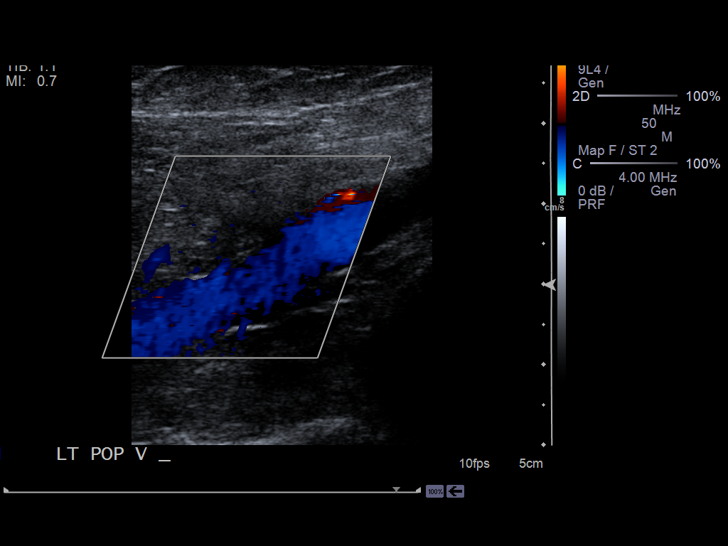
[im 12/24]
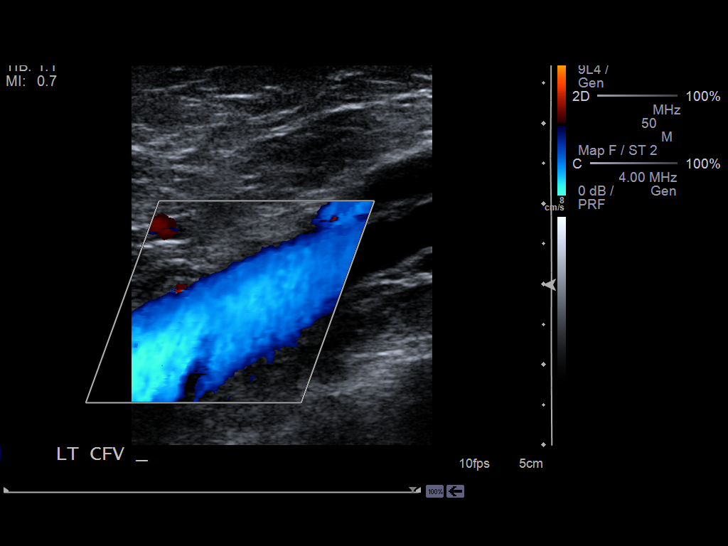
[im 13/24]
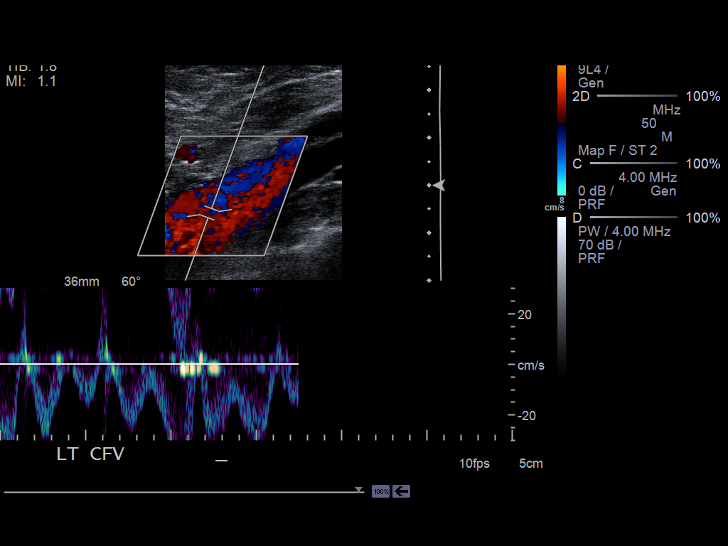
[im 15/24]
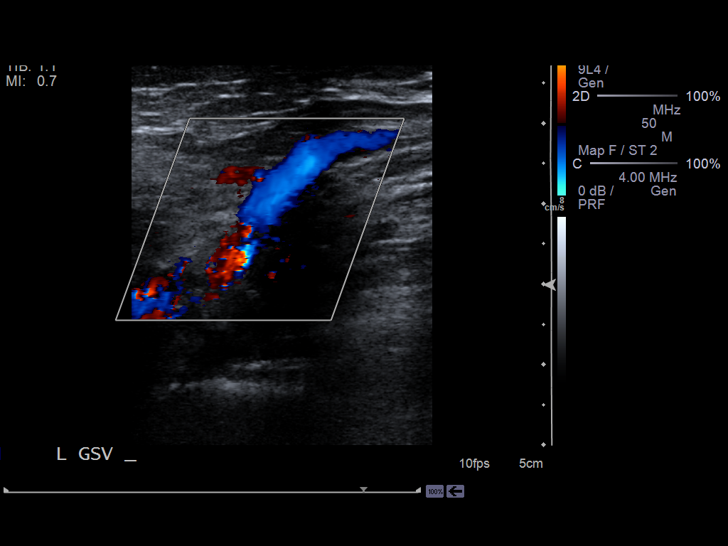
[im 17/24]
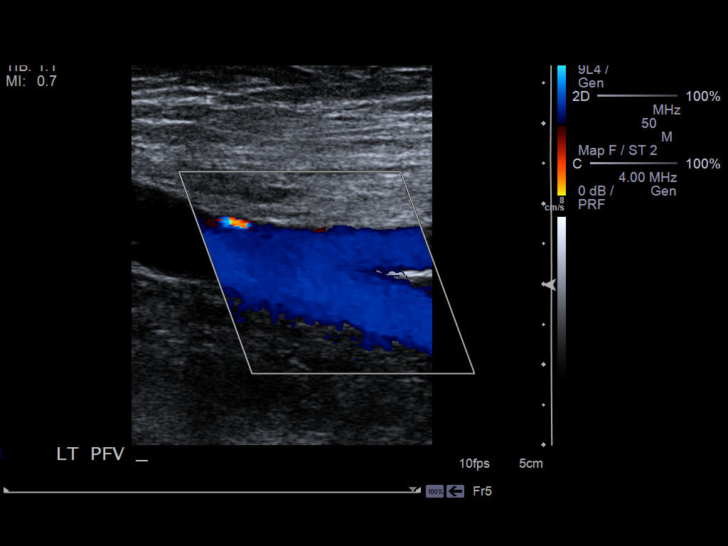
[im 19/24]
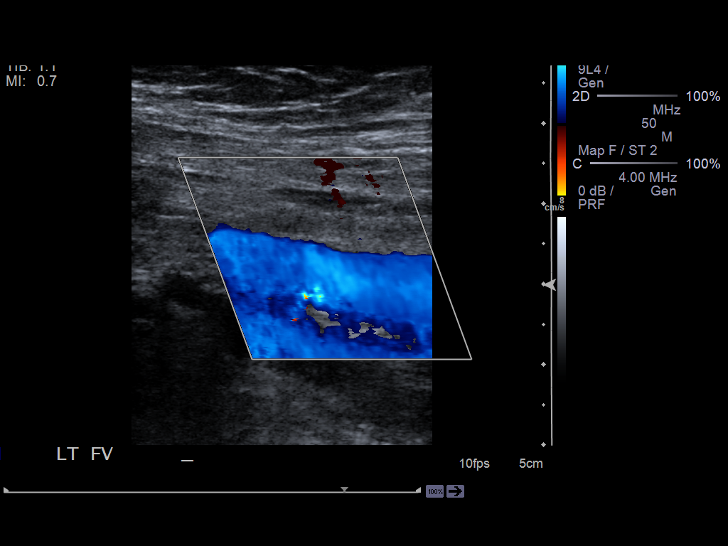
[im 20/24]
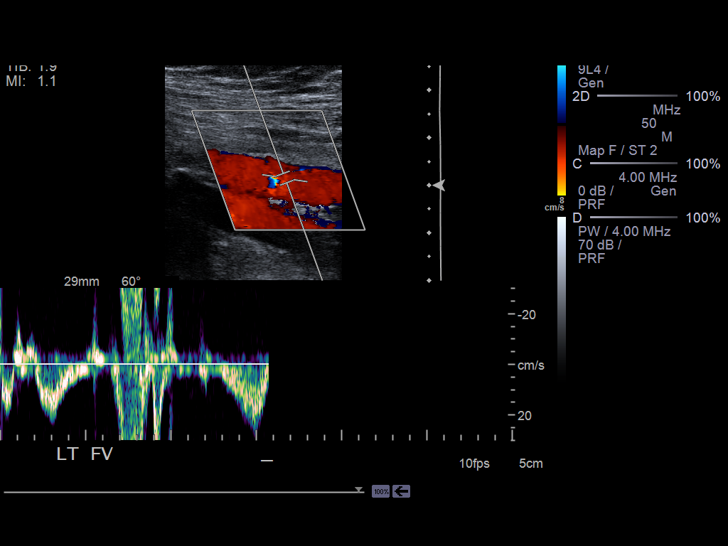
[im 22/24]
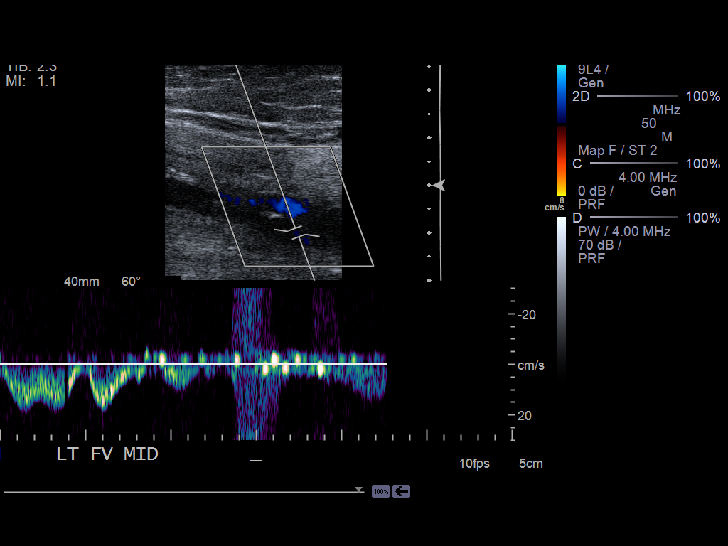
[im 24/24]
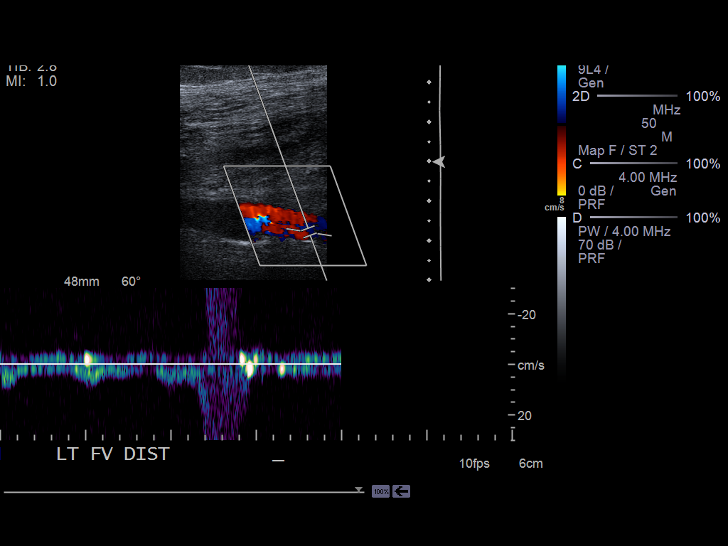

[14 of 24 positions shown; findings below may reference images not displayed]

PROCEDURE:     US  - US DOPPLER LOW EXTR LEFT  - October 10, 2012  [DATE]

RESULT:     Doppler interrogation of the deep venous system of the left leg
is performed from the common femoral vein through the popliteal vein.
Grayscale compression images demonstrate complete compressibility. The color
Doppler and spectral Doppler appearance is normal. No abnormal fluid
collection is present.
IMPRESSION: 1. No evidence of left lower extremity deep vein thrombosis.

[REDACTED]

## 2014-12-23 IMAGING — CR DG CHEST 2V
1 series · 2 of 2 positions shown · non-contrast
Comparison: none

REASON FOR EXAM: le edema, intermittent shortness of breath
COMMENTS:

[Series 5: w chest lat · 0.14mm/px · 2 of 2 slices shown]
[im 1/2]
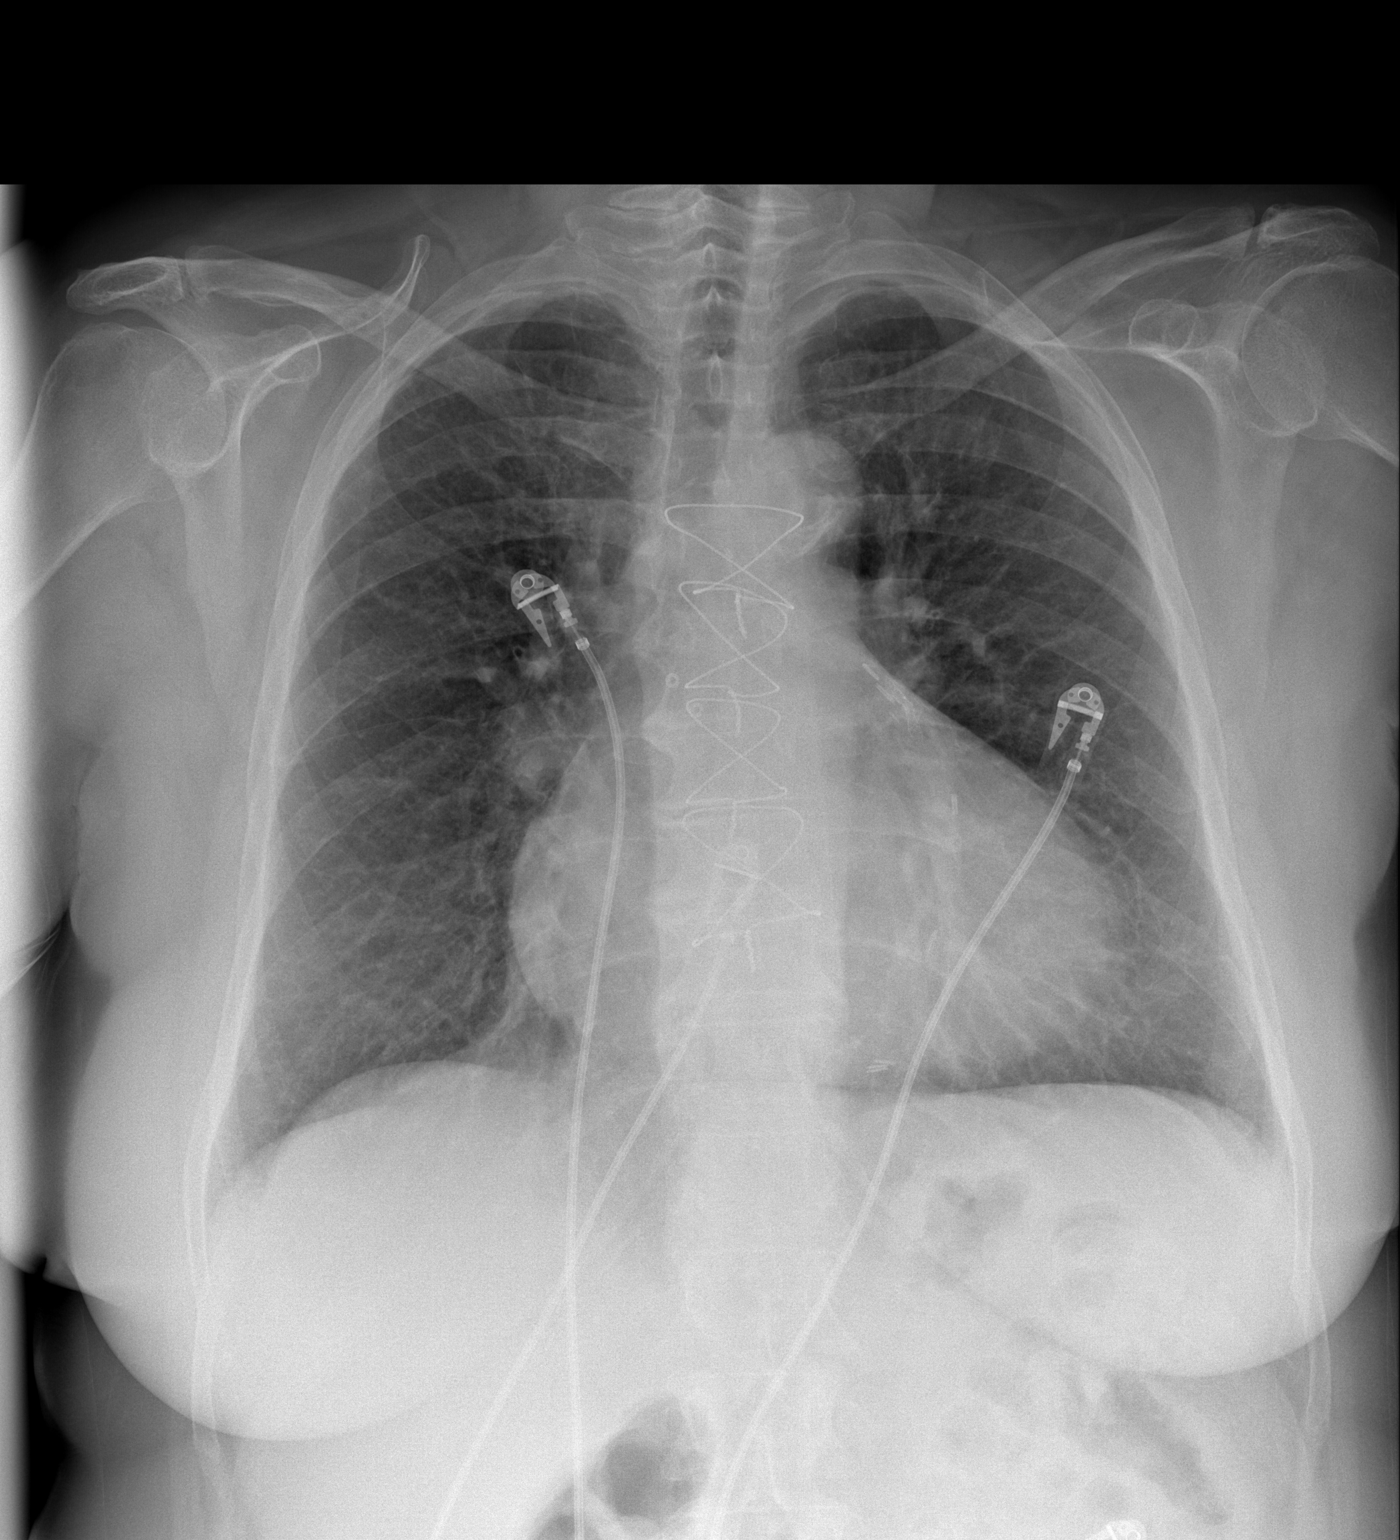
[im 2/2]
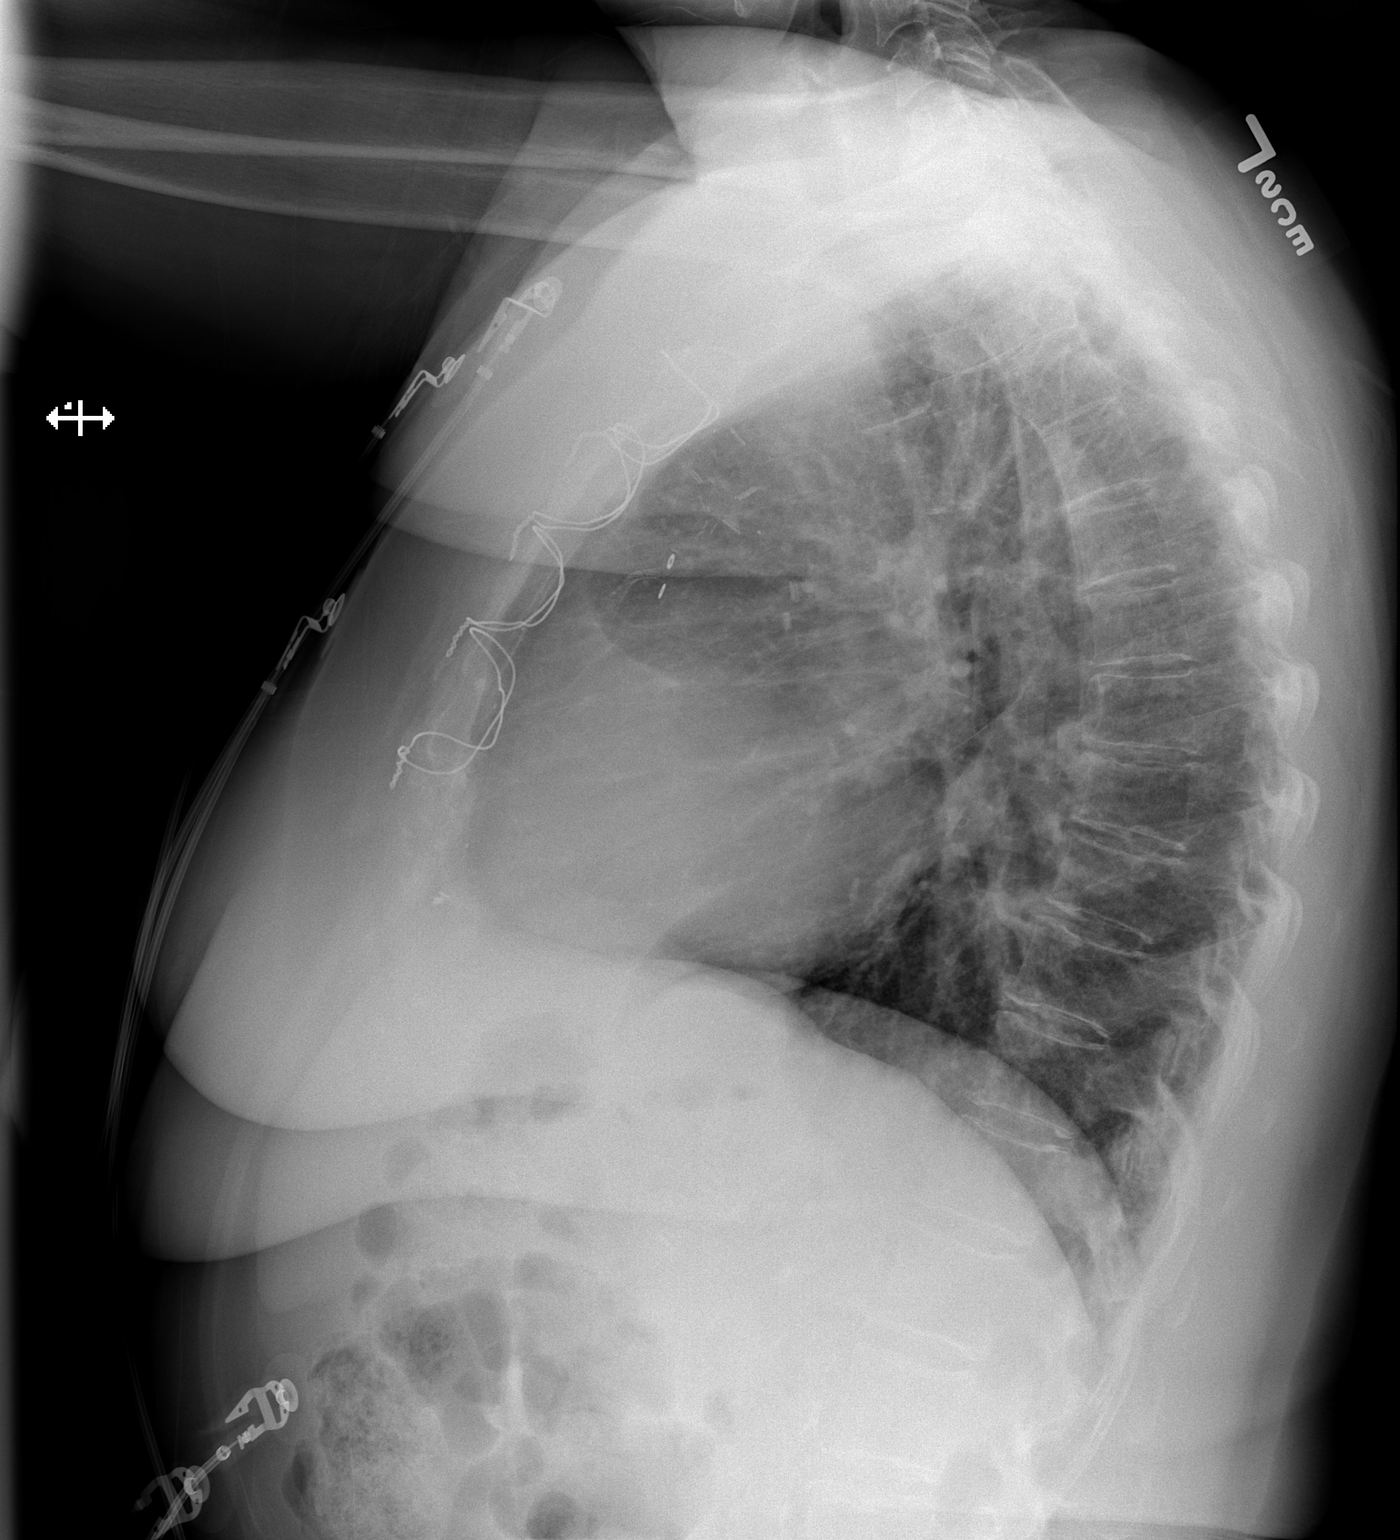

[2 of 2 positions shown; findings below may reference images not displayed]

PROCEDURE:     DXR - DXR CHEST PA (OR AP) AND LATERAL  - October 10, 2012  [DATE]

RESULT:     Comparison is made to the study of 09/25/2012.

Sternotomy wires are present. The cardiac silhouette is enlarged. There is
diffuse interstitial prominence with peribronchial thickening. There is no
effusion or pneumothorax. The appearance is stable.
IMPRESSION: 1. Stable cardiomegaly with mild interstitial prominence suggestive of
interstitial edema. No pleural effusion or focal consolidation. The stable
appearance.

[REDACTED]

## 2015-01-06 IMAGING — CR DG CHEST 2V
1 series · 2 of 2 positions shown · non-contrast
Comparison: none

REASON FOR EXAM: chest pain
COMMENTS:   May transport without cardiac monitor

PROCEDURE:     DXR - DXR CHEST PA (OR AP) AND LATERAL  - October 24, 2012 [DATE]
RESULT:     Comparison: None

[Series 1: w chest pa · 0.14mm/px · 2 of 2 slices shown]
[im 1/2]
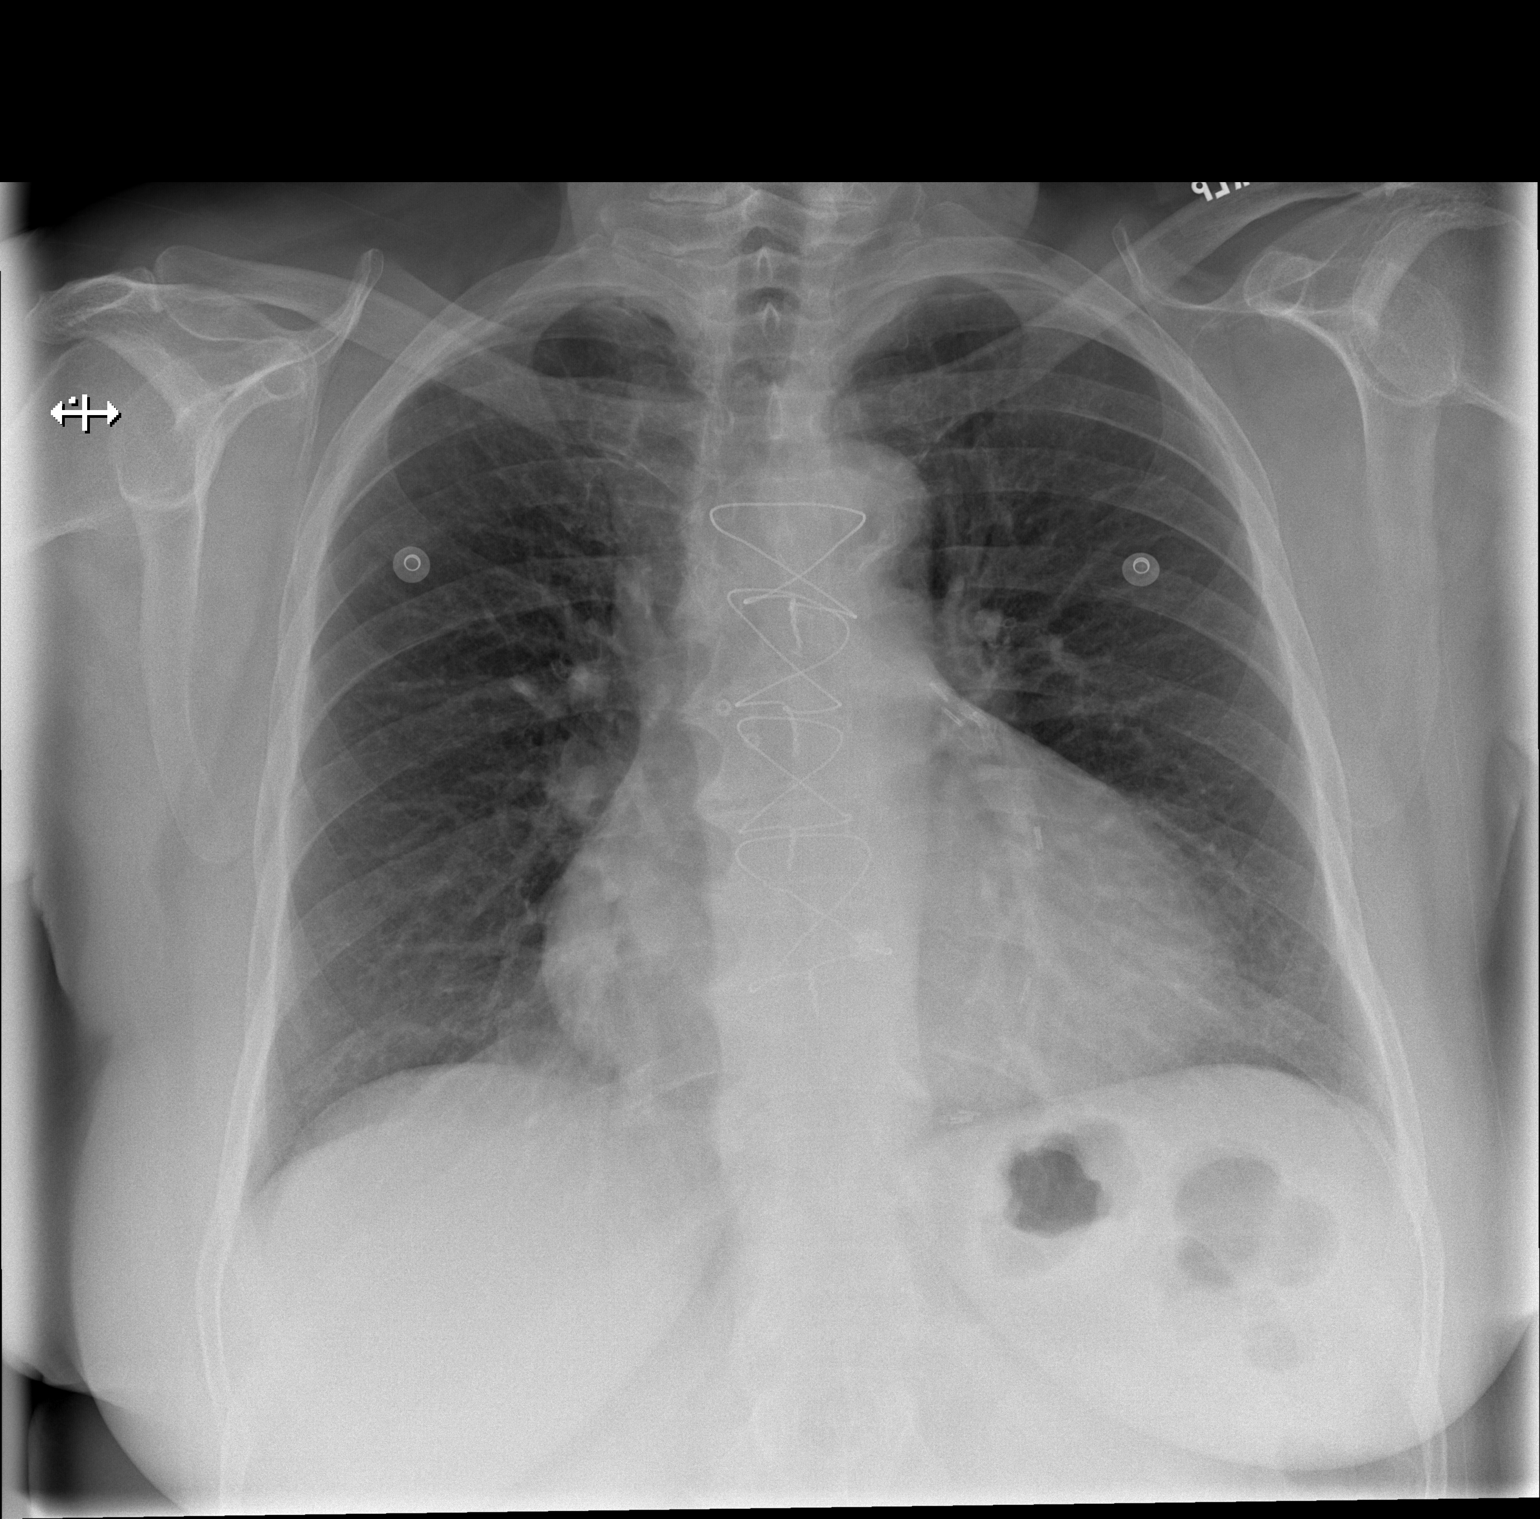
[im 2/2]
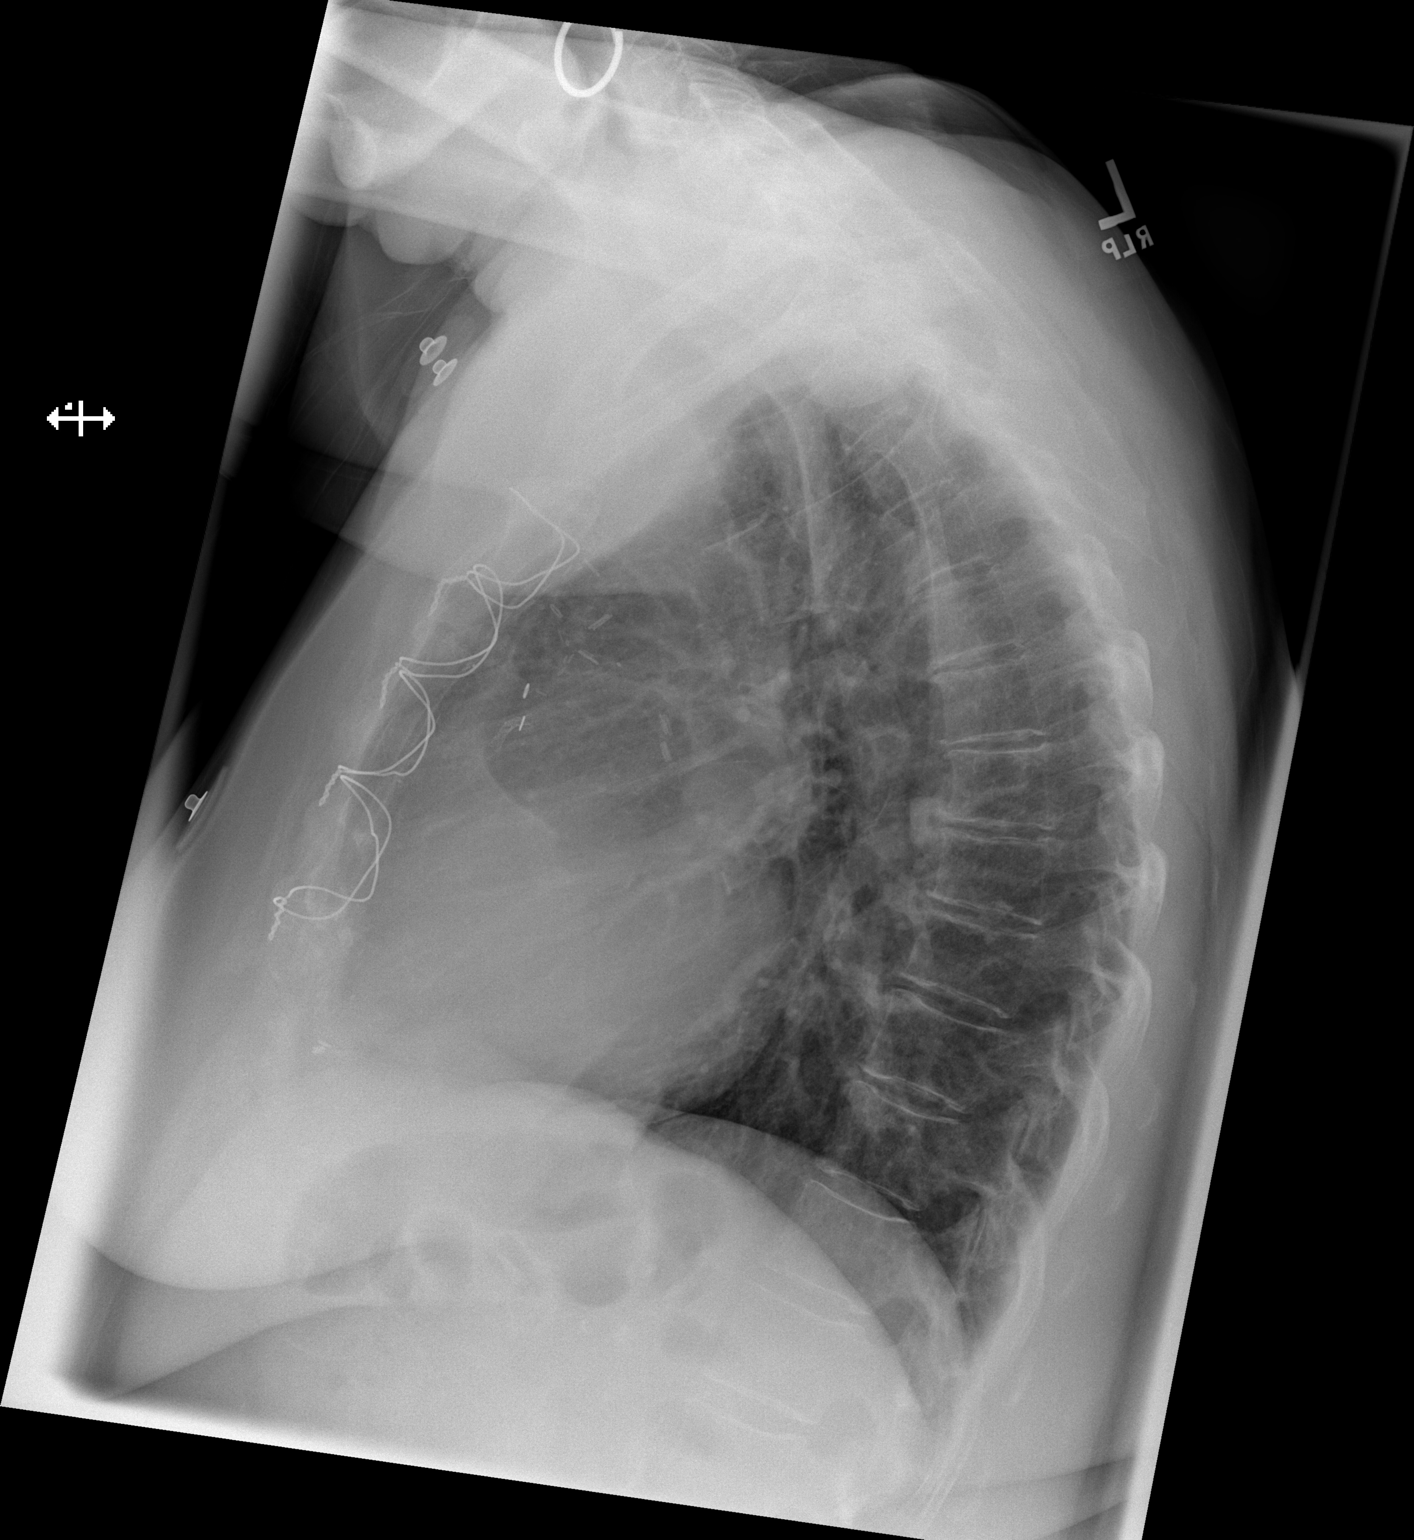

[2 of 2 positions shown; findings below may reference images not displayed]

FINDINGS: PA and lateral chest radiographs are provided.  There is no focal
parenchymal opacity, pleural effusion, or pneumothorax. The heart and
mediastinum are unremarkable. Prior median sternotomy. The osseous
structures are unremarkable.
IMPRESSION: No acute disease of the che[REDACTED]

## 2015-02-19 IMAGING — CR DG CHEST 1V PORT
1 series · 1 of 1 positions shown · non-contrast
Comparison: none

REASON FOR EXAM: cp
COMMENTS:

PROCEDURE:     DXR - DXR PORTABLE CHEST SINGLE VIEW  - December 07, 2012  [DATE]
RESULT:     Comparison: None

[ap]
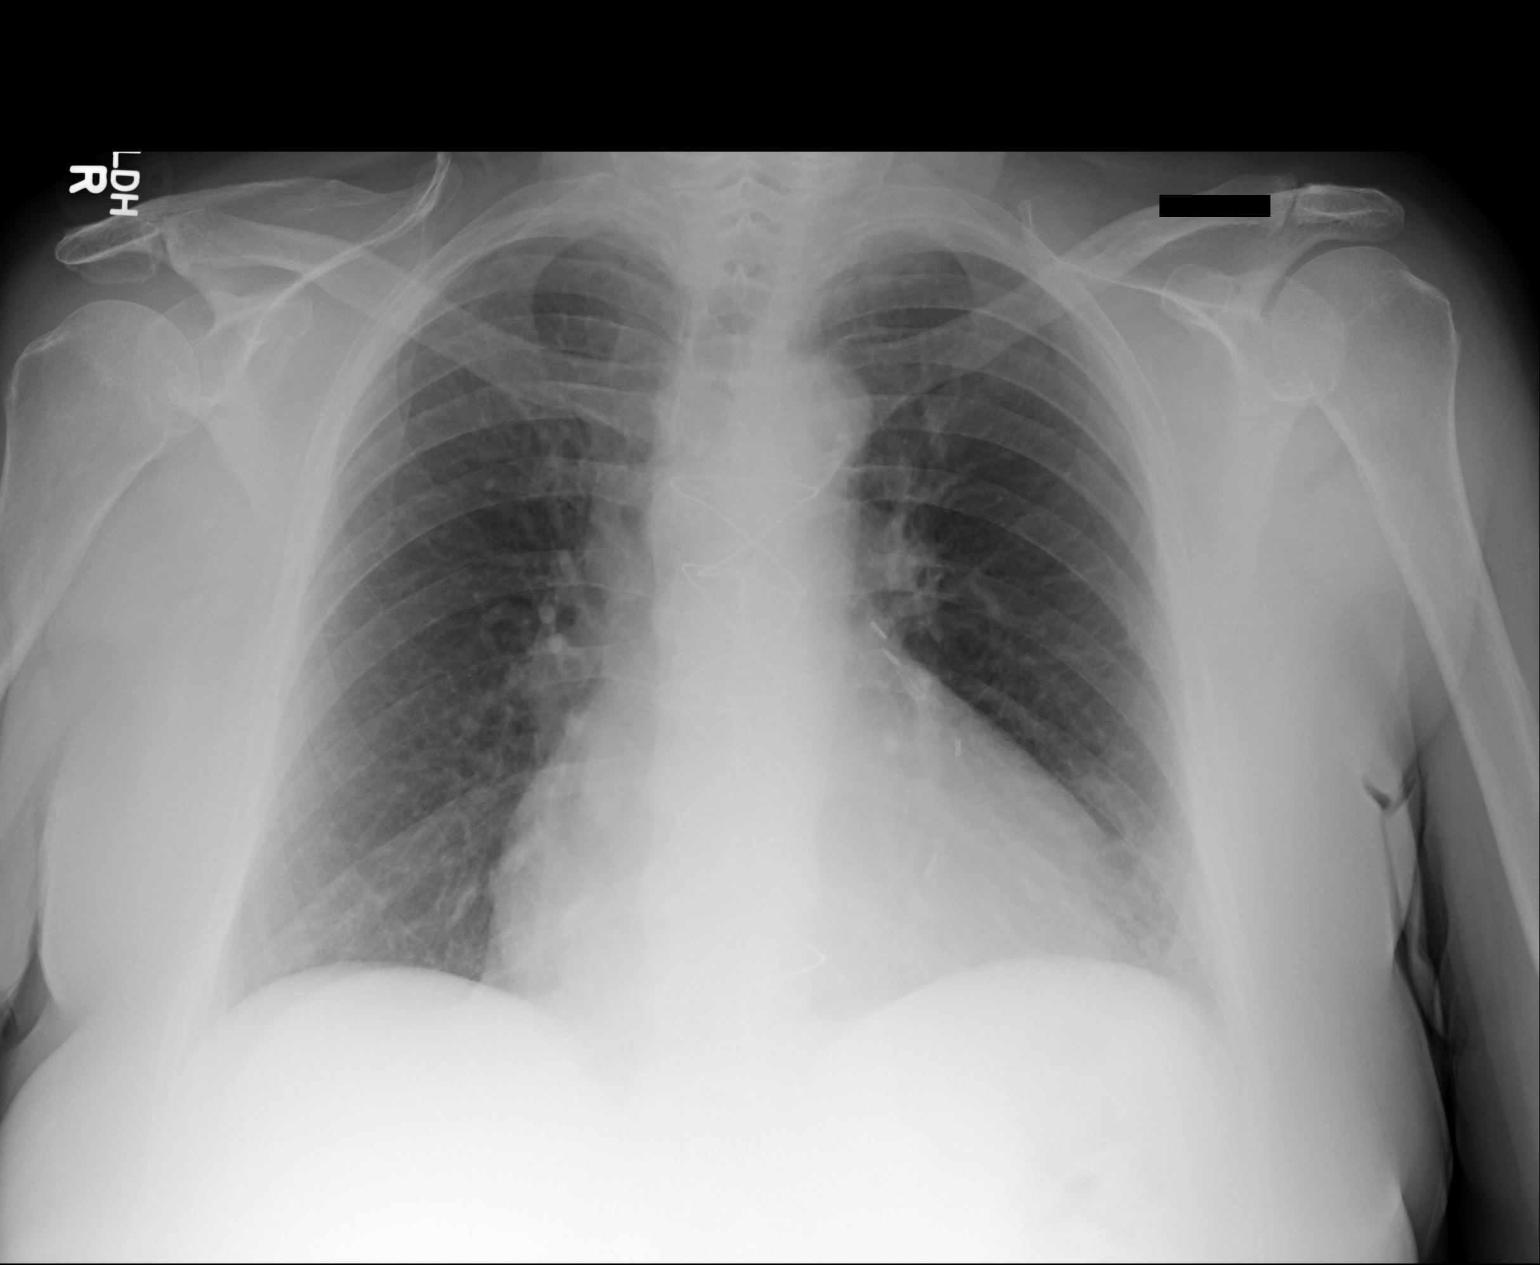

[1 of 1 positions shown; findings below may reference images not displayed]

FINDINGS: Single portable AP chest radiograph is provided.  There is no focal
parenchymal opacity, pleural effusion, or pneumothorax. Normal
cardiomediastinal silhouette. Prior CABG. The osseous structures are
unremarkable.
IMPRESSION: No acute disease of the che[REDACTED]

## 2015-02-24 IMAGING — CR DG CHEST 2V
1 series · 2 of 2 positions shown · non-contrast
Comparison: none

REASON FOR EXAM: Chest Pain
COMMENTS:

[Series 2: w chest pa · 0.14mm/px · 2 of 2 slices shown]
[im 1/2]
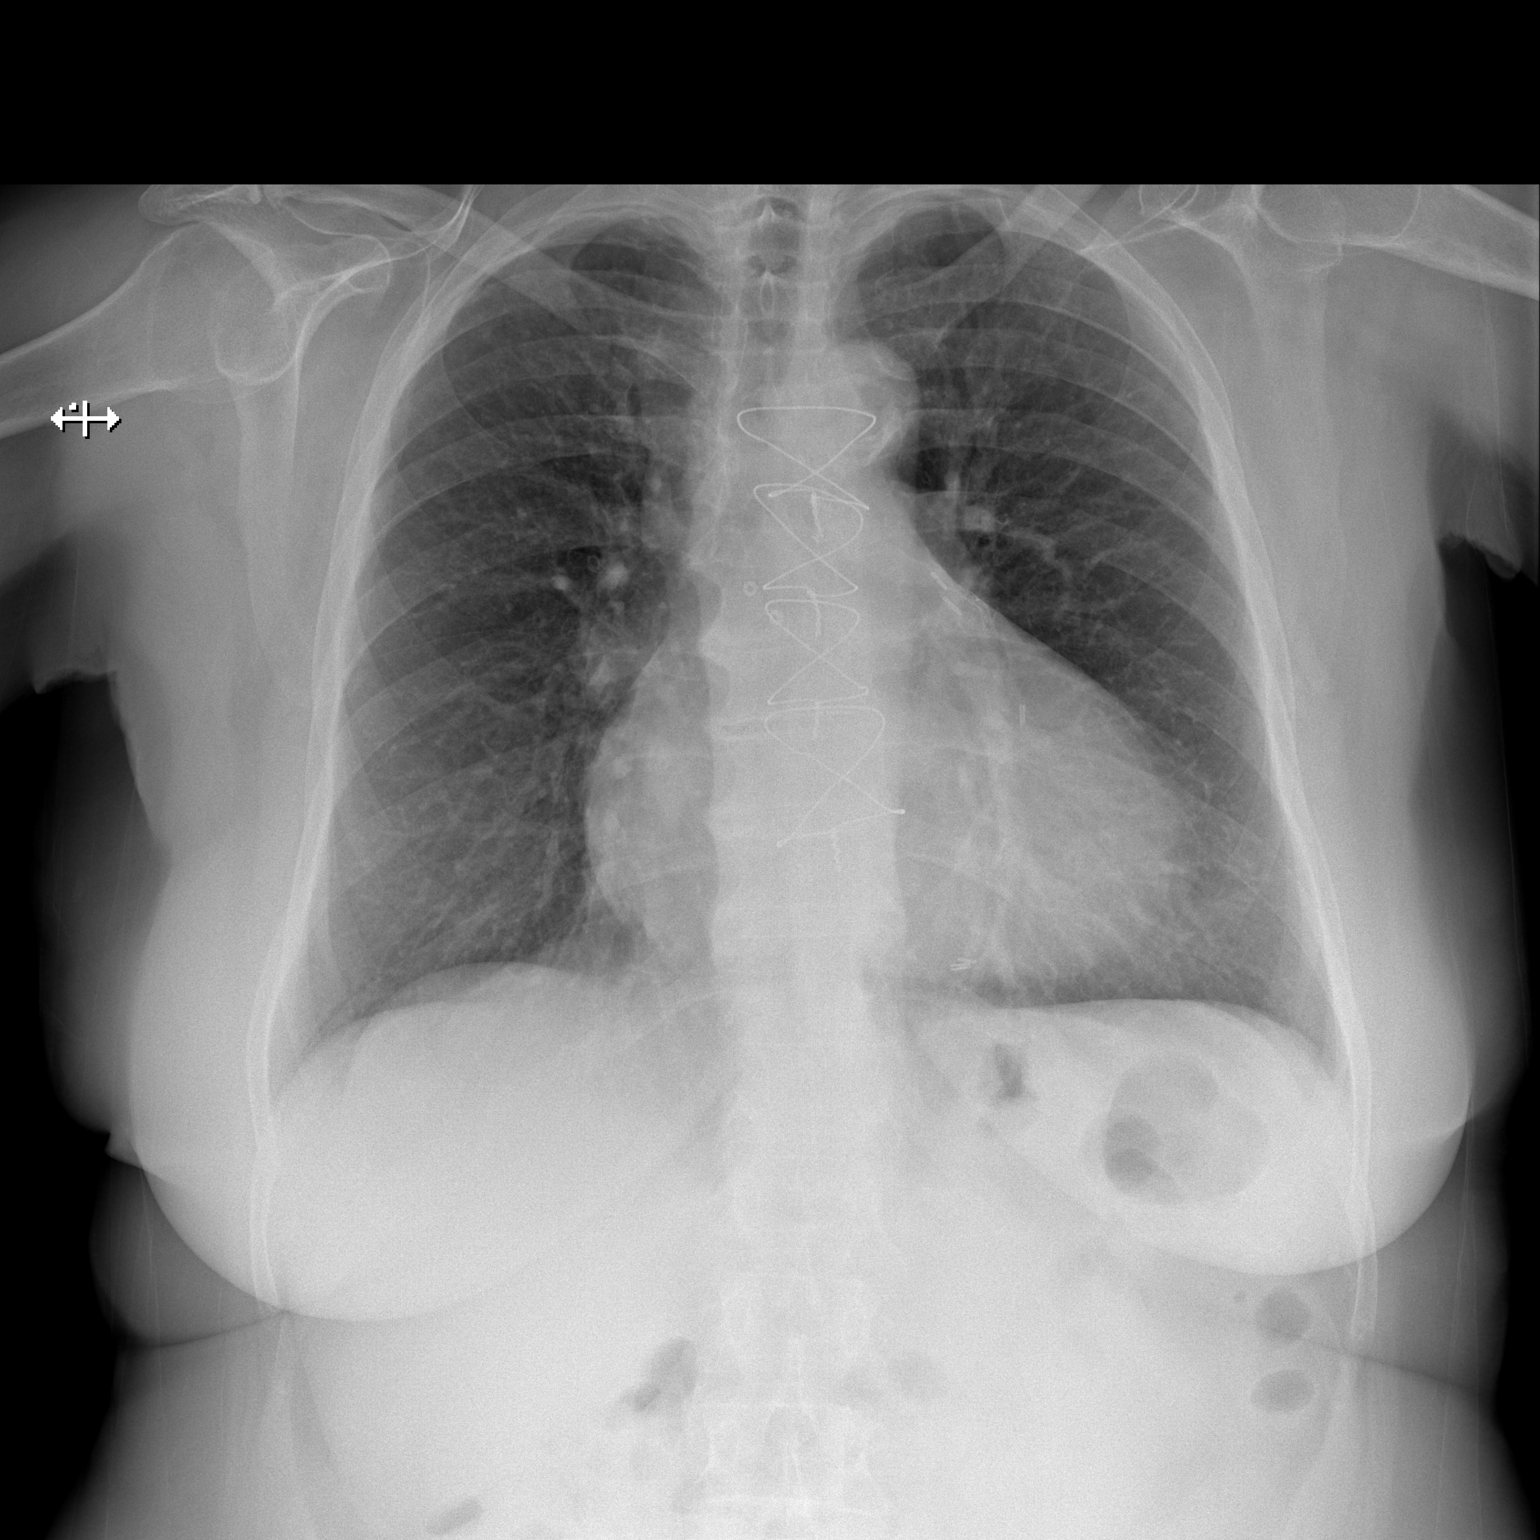
[im 2/2]
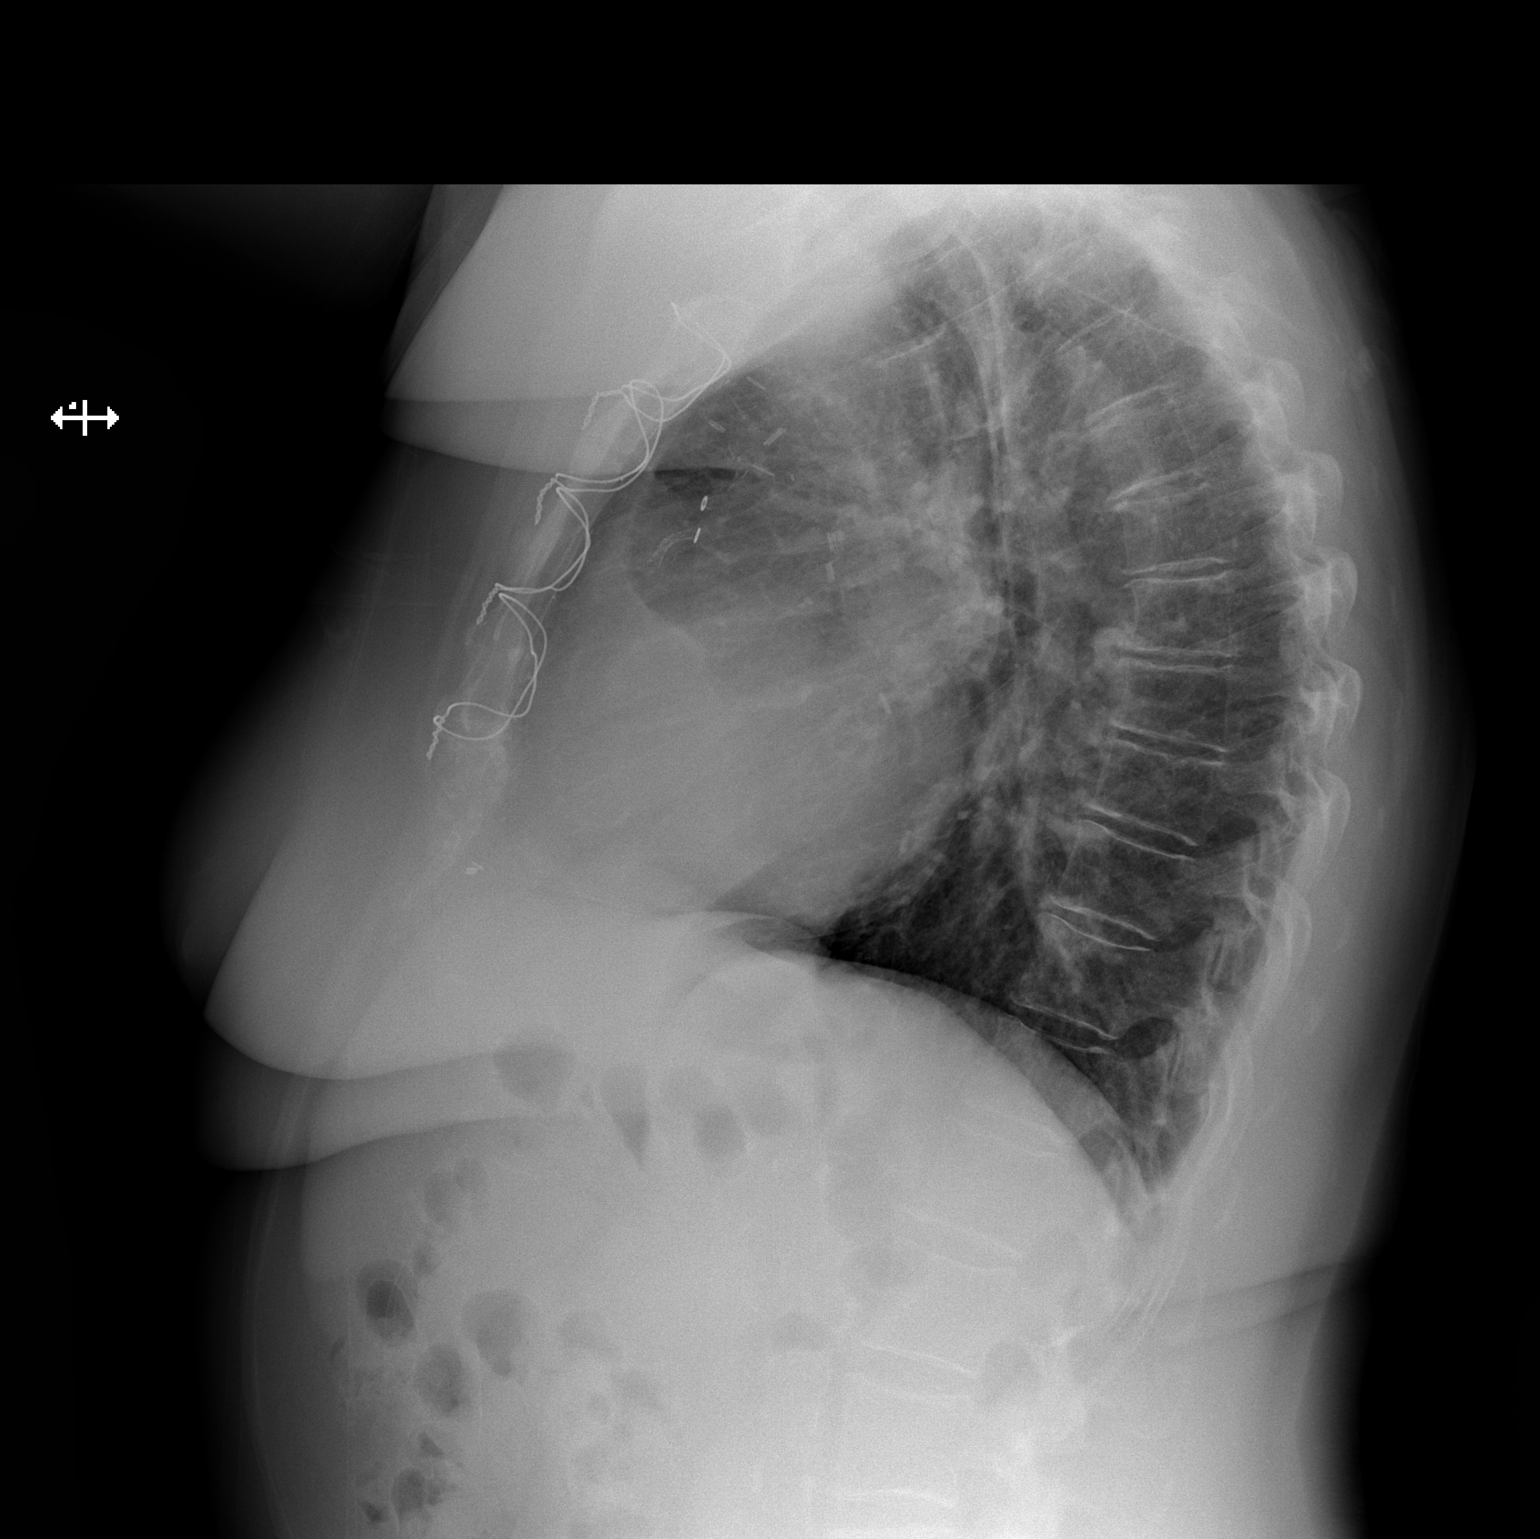

[2 of 2 positions shown; findings below may reference images not displayed]

PROCEDURE:     DXR - DXR CHEST PA (OR AP) AND LATERAL  - December 12, 2012  [DATE]

RESULT:     Comparison is made to the previous examination dated 12/07/2012.

Sternotomy wires are present. The cardiac silhouette is borderline enlarged.
The lungs are clear. The bony and mediastinal structures are unremarkable.
IMPRESSION: Borderline cardiomegaly. No acute cardiopulmonary disease.

[REDACTED]

## 2015-03-04 IMAGING — US US EXTREM LOW VENOUS*L*
1 series · 14 of 22 positions shown · non-contrast
Comparison: none

REASON FOR EXAM: Left lower leg swelling, pain, DVT vs cellulitis
COMMENTS:

PROCEDURE:     US  - US DOPPLER LOW EXTR LEFT  - December 20, 2012  [DATE]
RESULT:     Comparison: None

[Series 1: us extrem low venous*left* · 0.09mm/px · 14 of 22 slices shown]
[im 1/22]
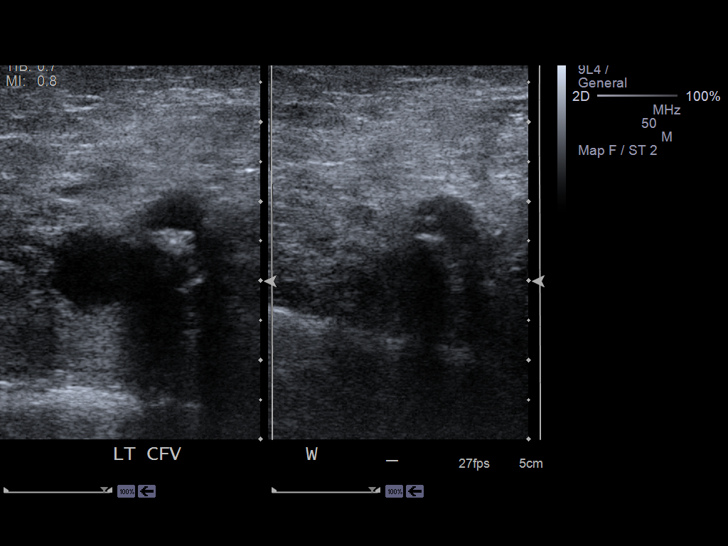
[im 3/22]
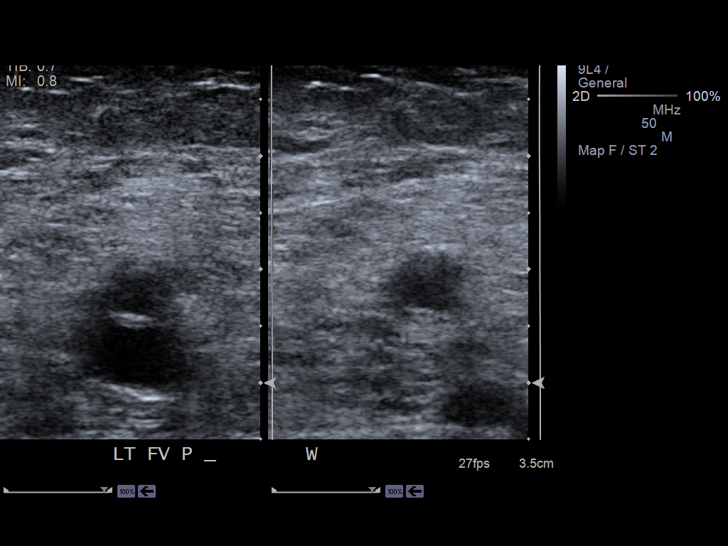
[im 4/22]
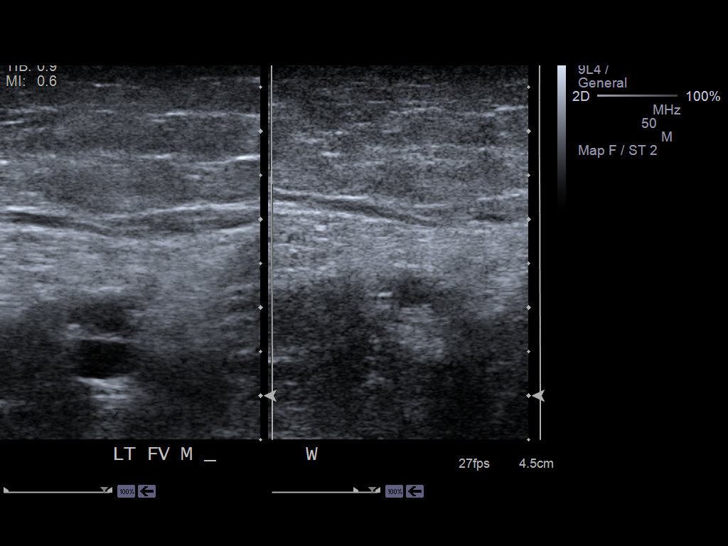
[im 6/22]
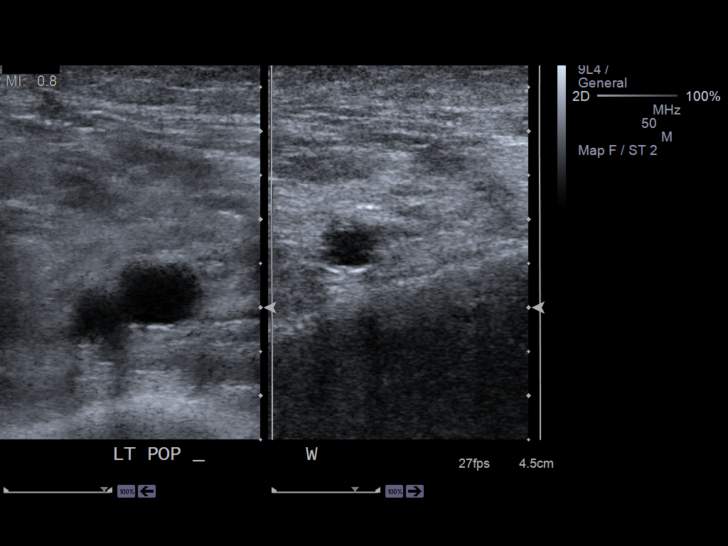
[im 8/22]
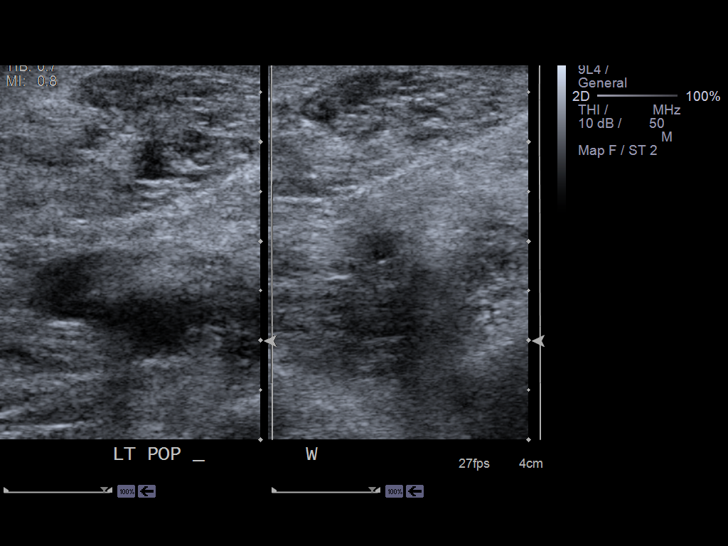
[im 9/22]
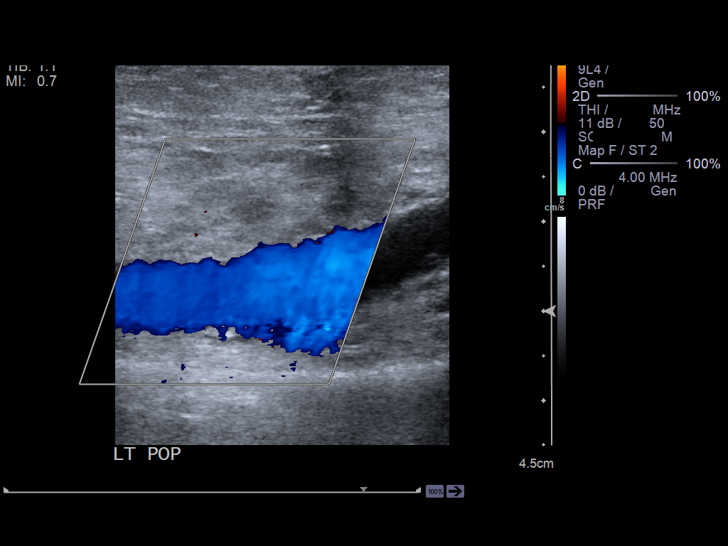
[im 11/22]
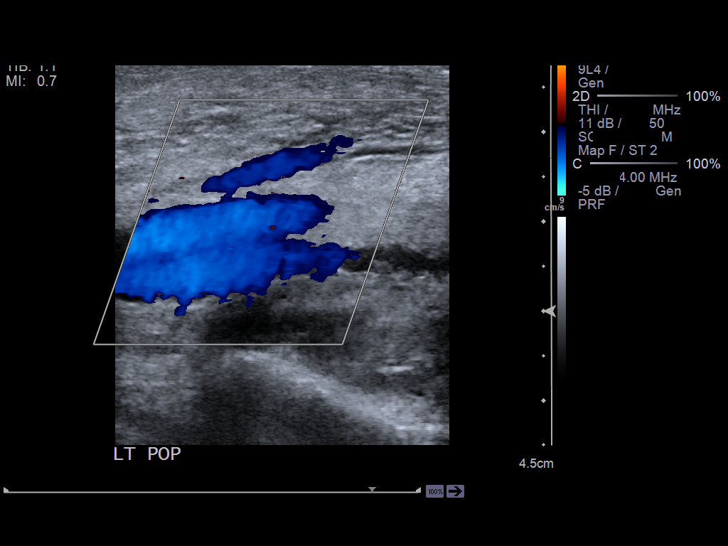
[im 12/22]
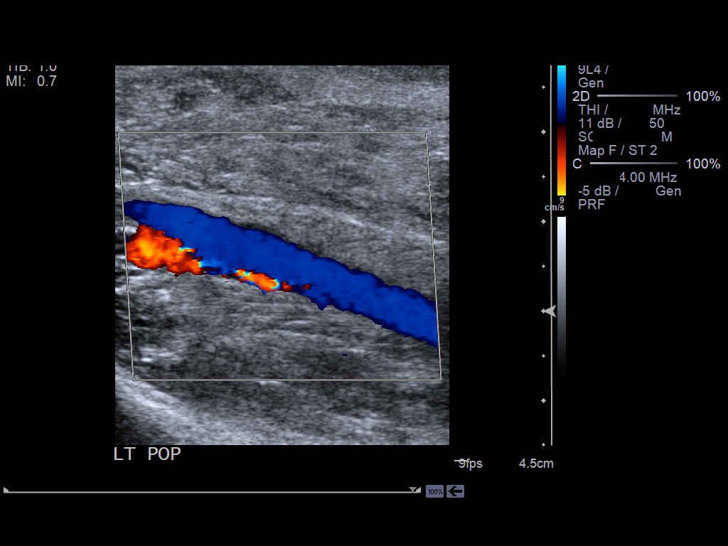
[im 14/22]
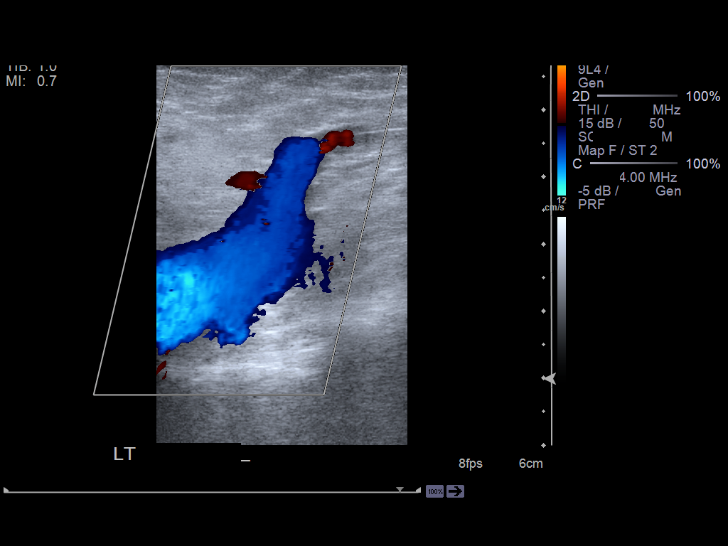
[im 15/22]
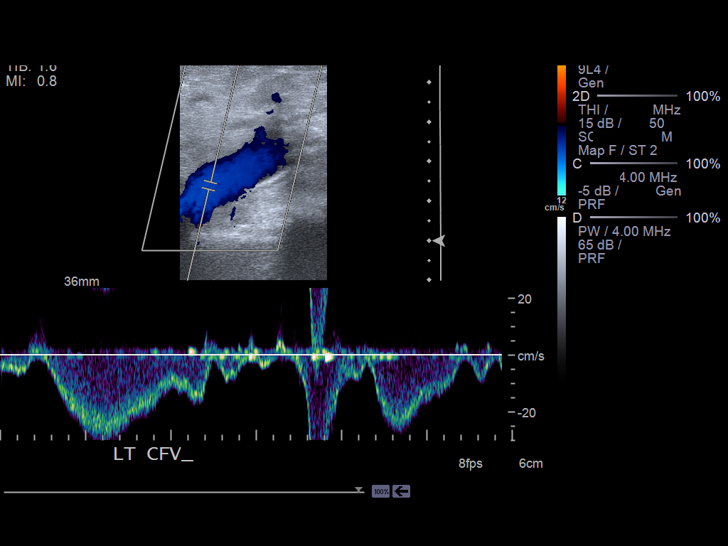
[im 17/22]
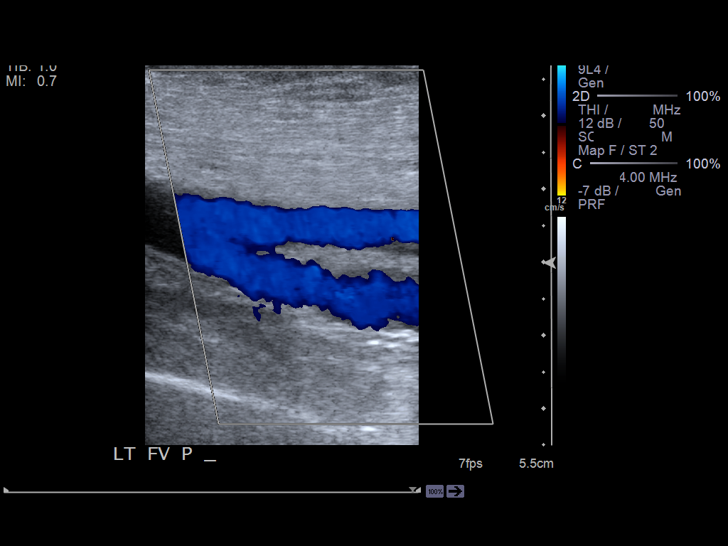
[im 19/22]
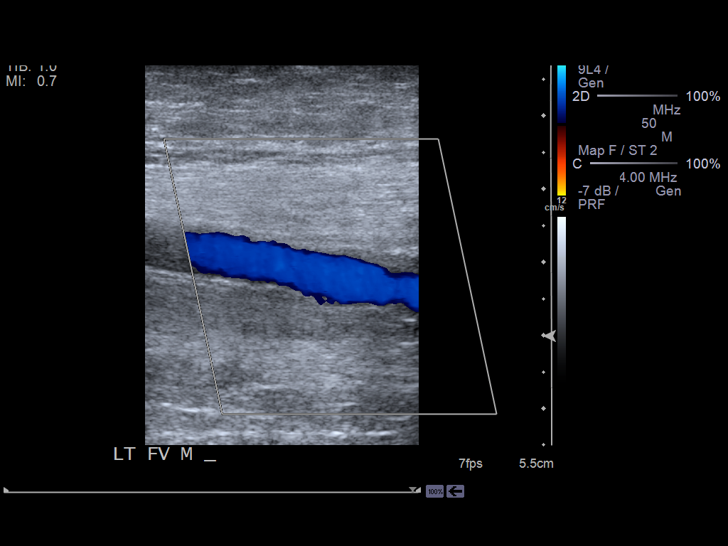
[im 20/22]
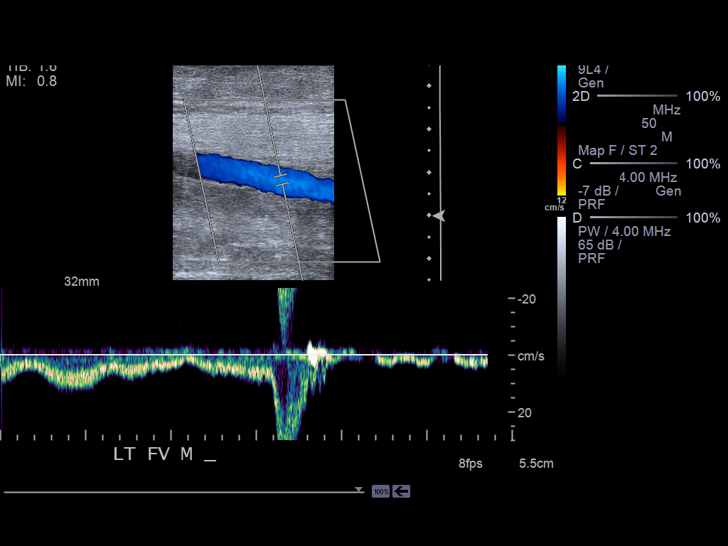
[im 22/22]
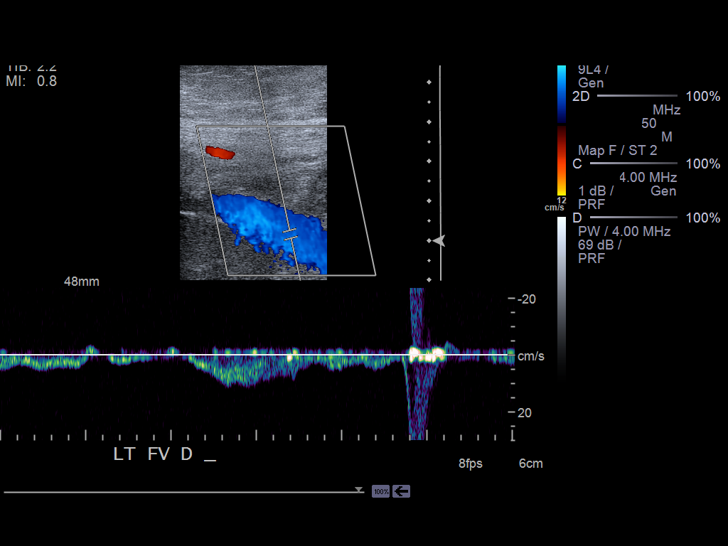

[14 of 22 positions shown; findings below may reference images not displayed]

FINDINGS: Multiple longitudinal and transverse gray-scale as well as color
and spectral Doppler images of the left lower extremity veins were obtained
from the common femoral veins through the popliteal veins.

The left common femoral, greater saphenous, femoral, popliteal veins, and
venous trifurcation are patent, demonstrating normal color-flow and
compressibility. No intraluminal thrombus is identified.There is normal
respiratory variation and augmentation demonstrated at all vein levels.
IMPRESSION: No evidence of DVT in the left lower extremity.

[REDACTED]

## 2015-03-12 IMAGING — CR DG CHEST 1V PORT
1 series · 1 of 1 positions shown · non-contrast
Comparison: none

REASON FOR EXAM: Chest pain
COMMENTS:

[ap]
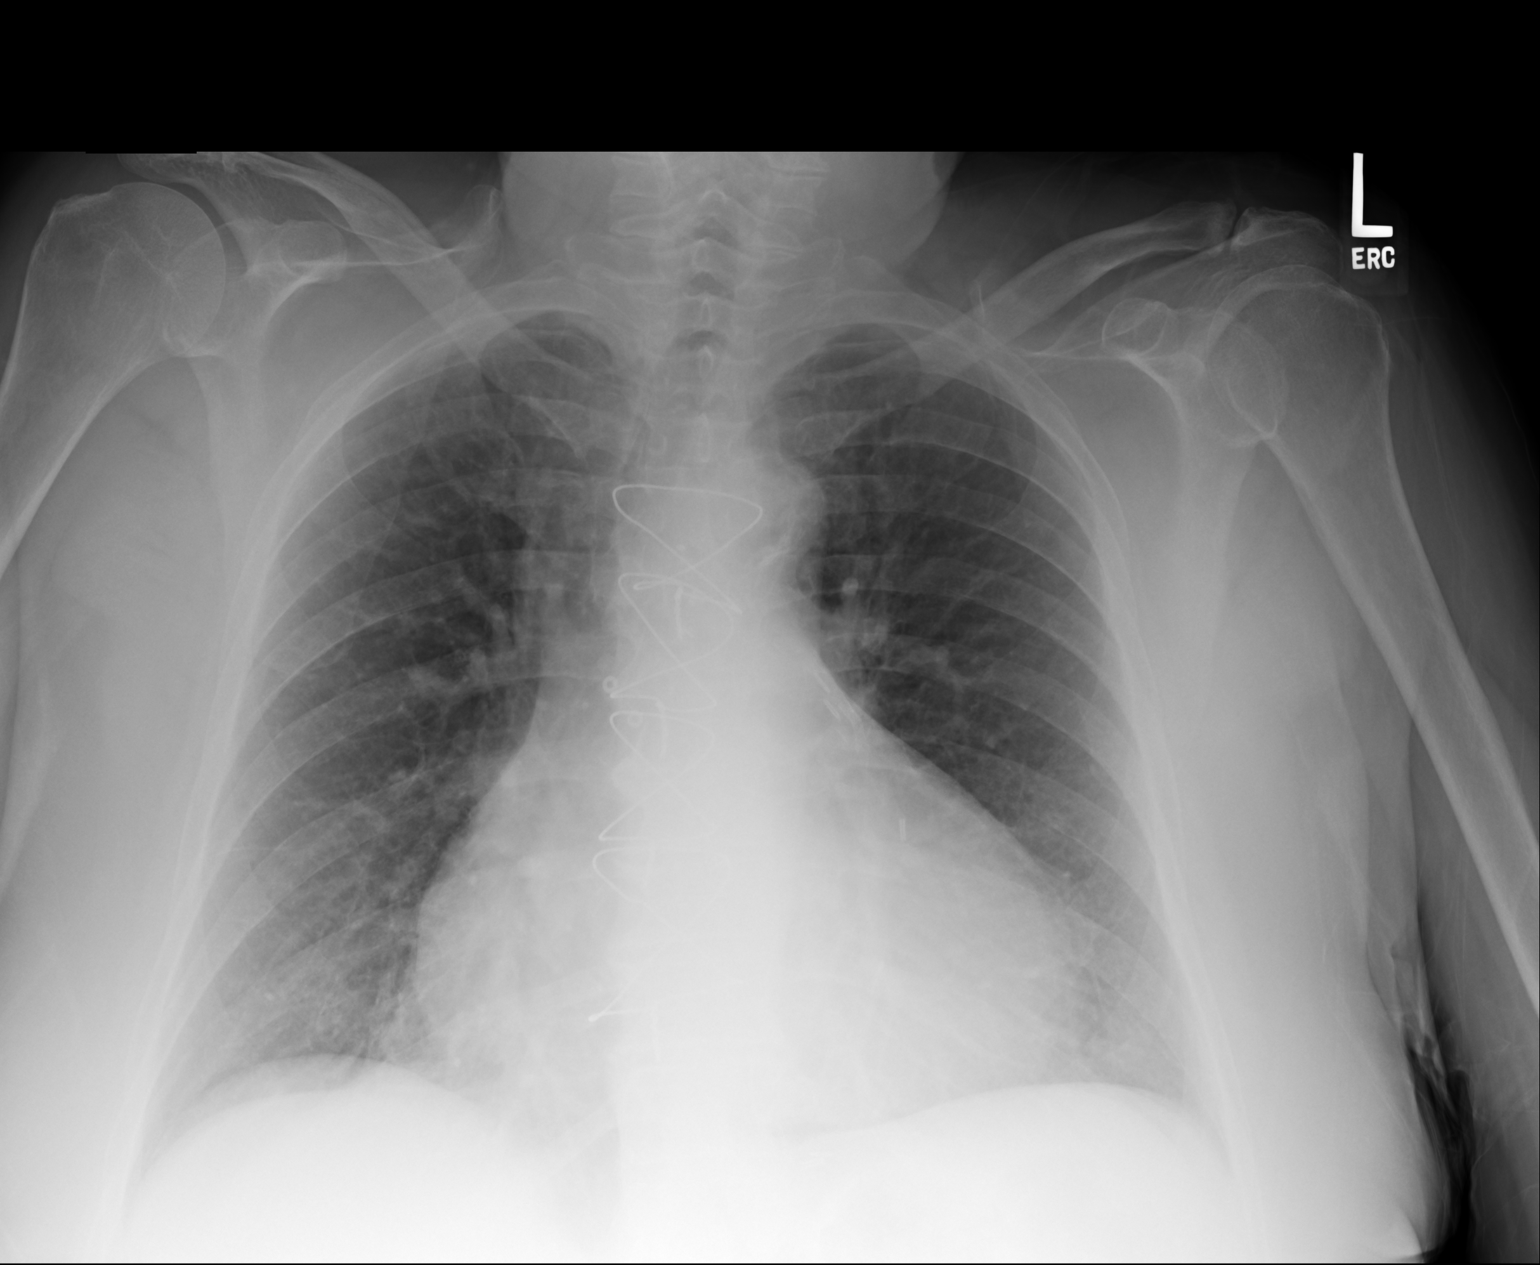

[1 of 1 positions shown; findings below may reference images not displayed]

PROCEDURE:     DXR - DXR PORTABLE CHEST SINGLE VIEW  - December 28, 2012  [DATE]

RESULT:     Comparison is made to the study of 12/12/2012. Sternotomy wires
are present. The cardiac silhouette is enlarged. Interstitial markings are
mildly prominent but unchanged. There is no effusion or pneumothorax.
IMPRESSION: 1. Stable cardiomegaly. No acute abnormality or significant interval change.

[REDACTED]

## 2015-08-11 IMAGING — CR DG CHEST 2V
1 series · 2 of 2 positions shown · non-contrast
Comparison: 12/28/2012

CLINICAL DATA: Chest pain since yesterday

EXAM:
CHEST  2 VIEW

[Series 1: w chest pa · 0.14mm/px · 2 of 2 slices shown]
[im 1/2]
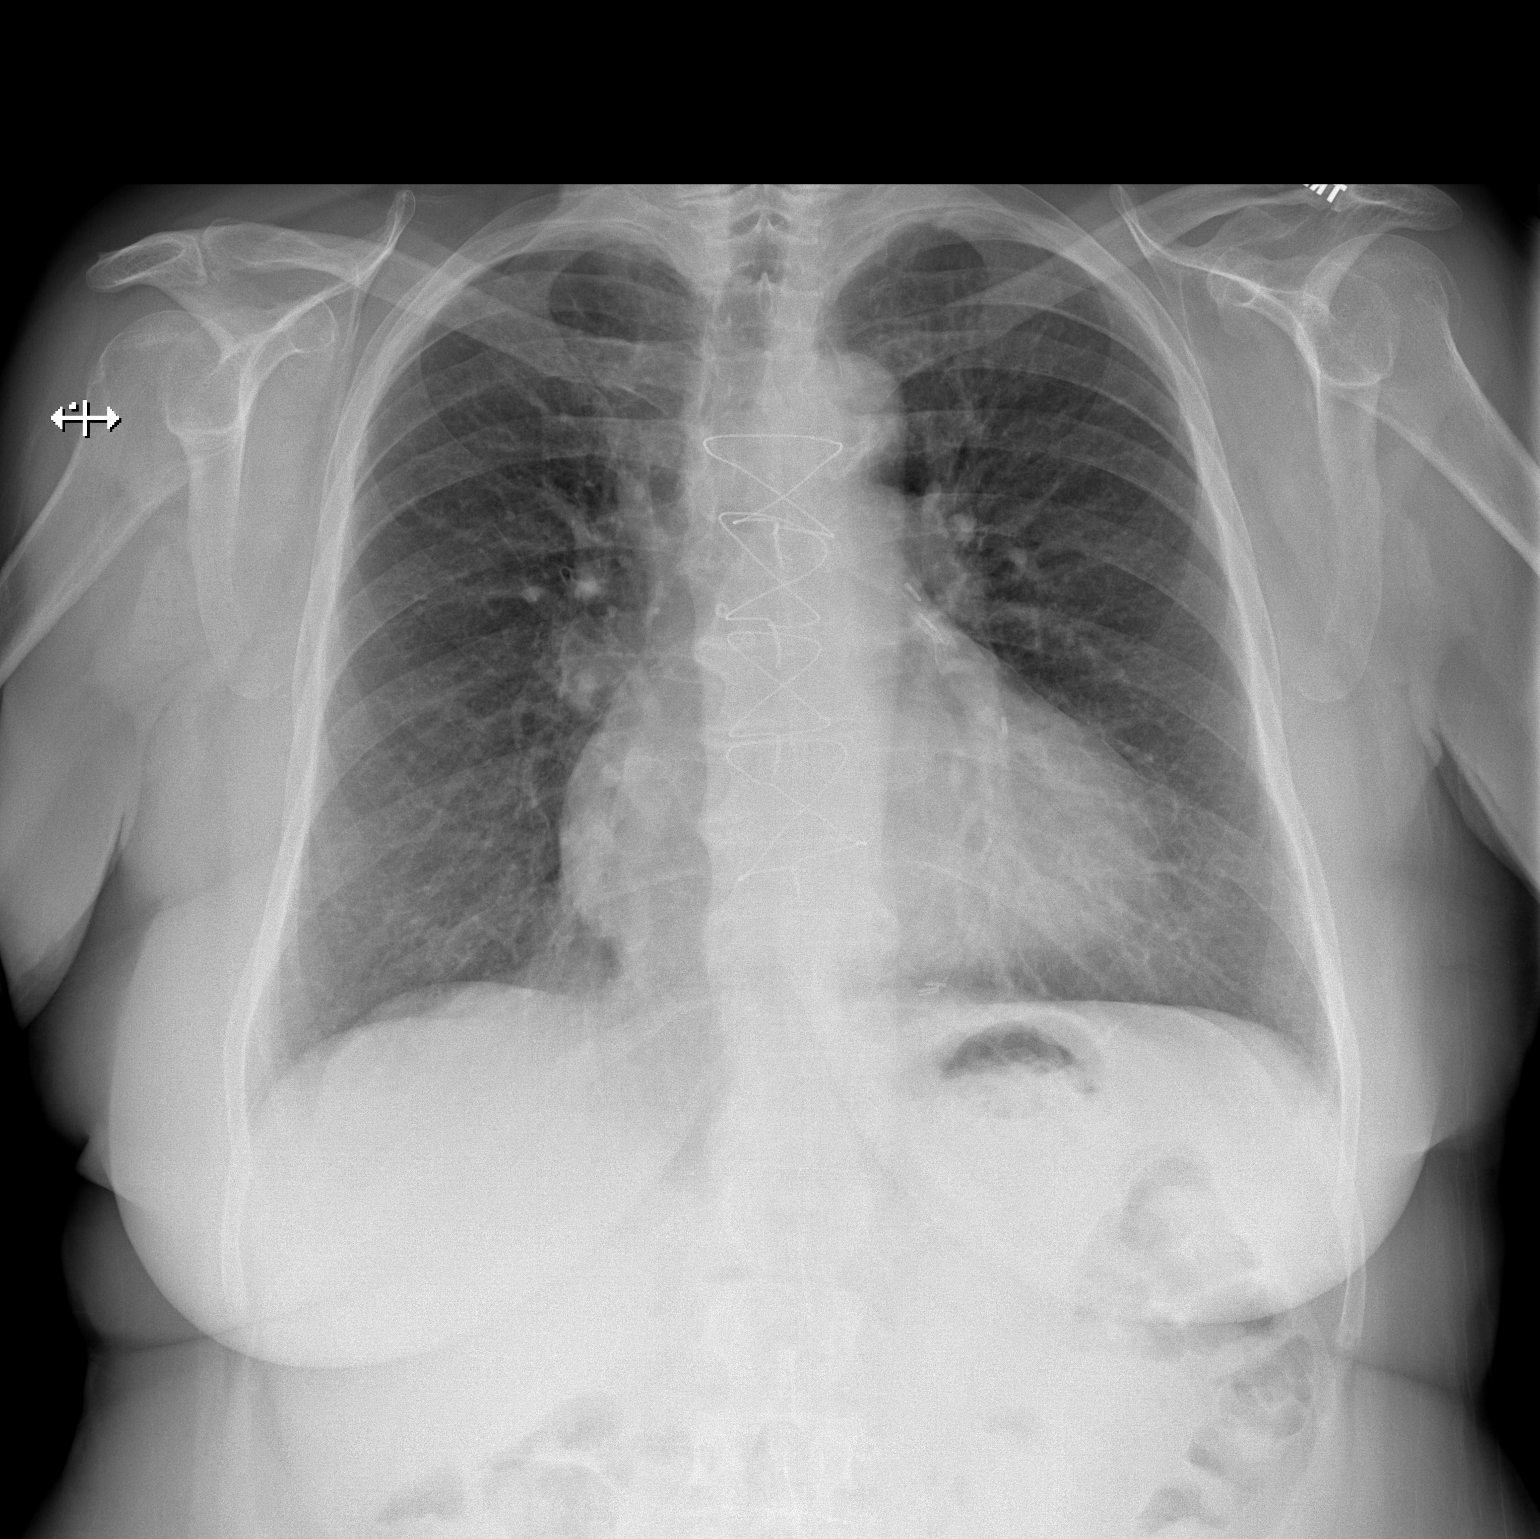
[im 2/2]
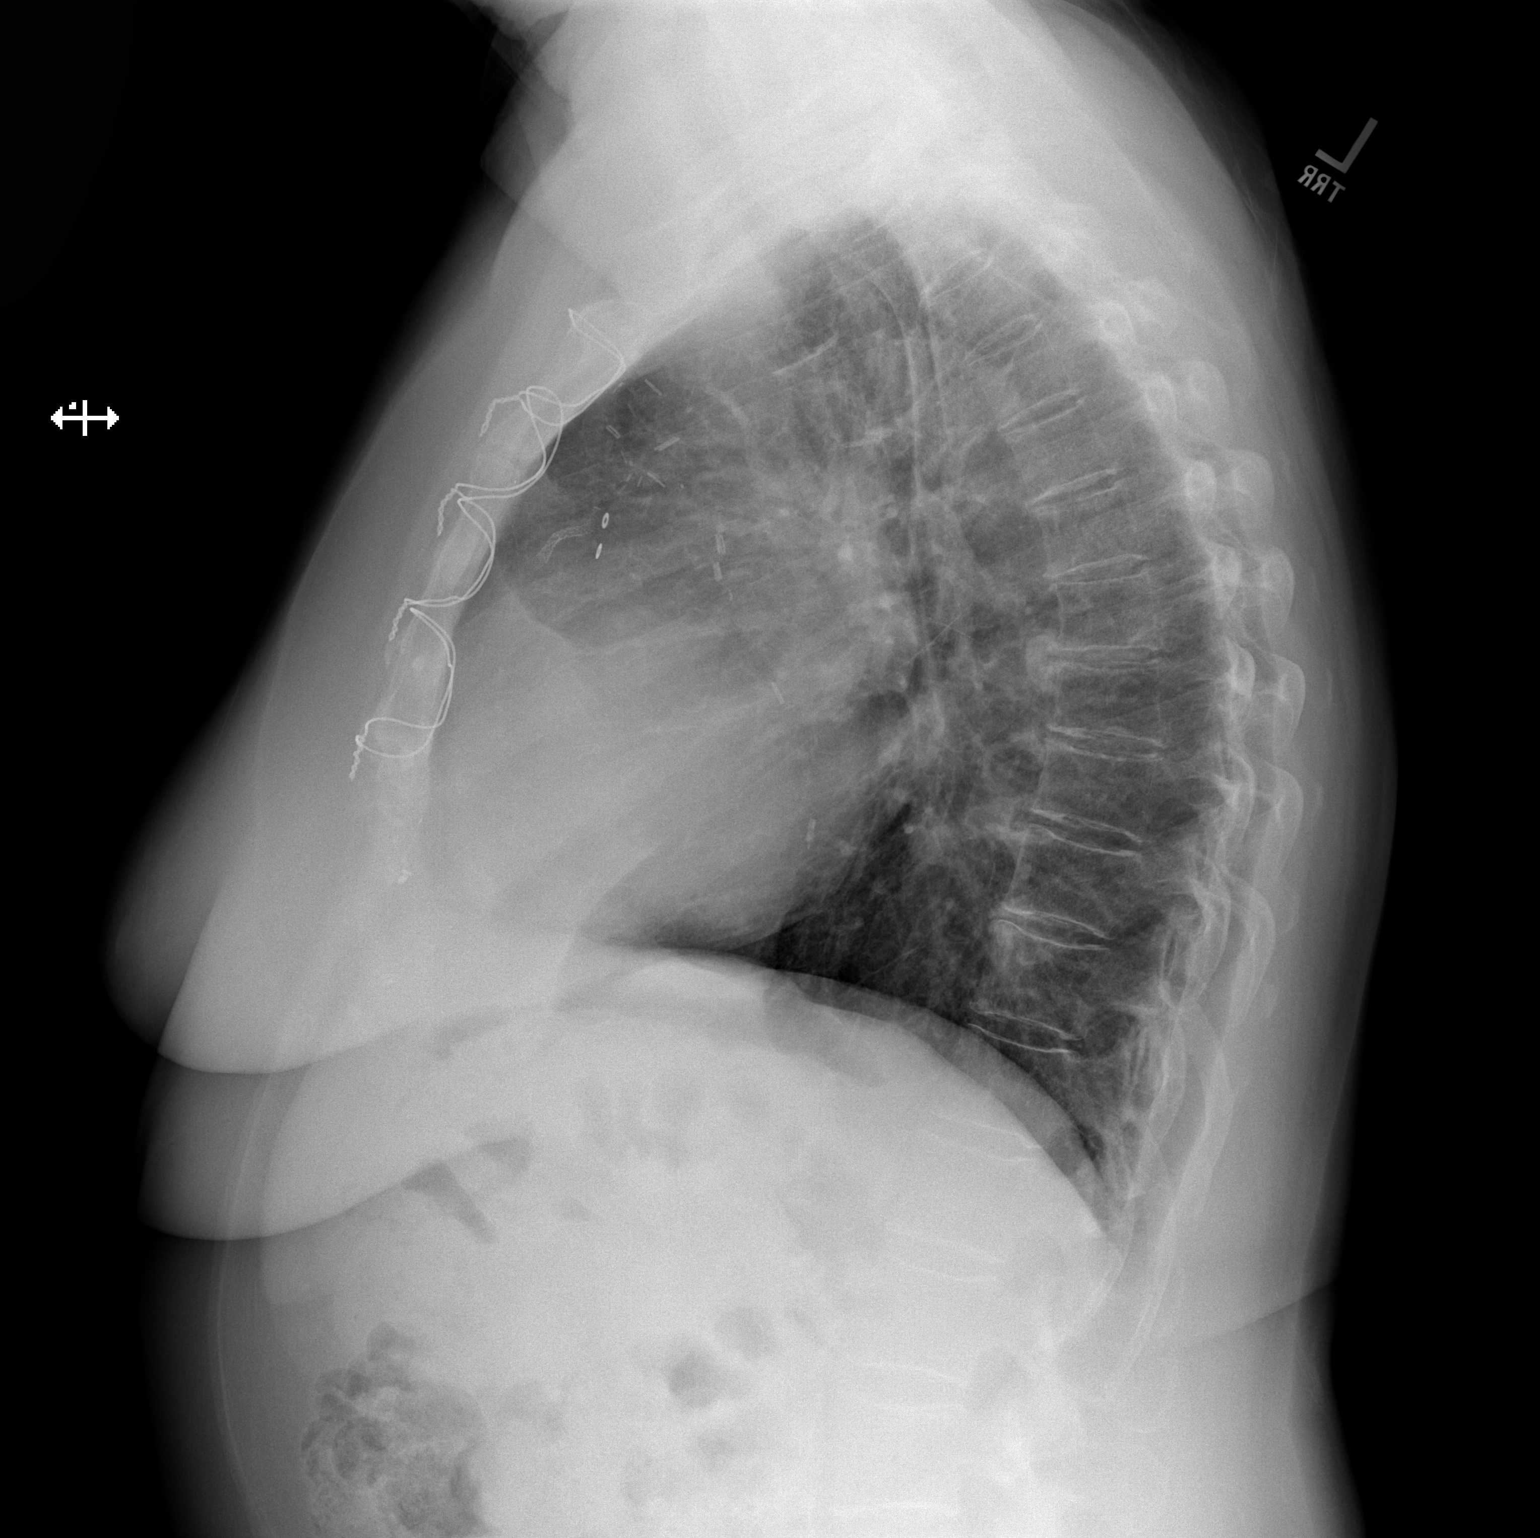

[2 of 2 positions shown; findings below may reference images not displayed]

FINDINGS: Enlargement of cardiac silhouette post CABG.

Mediastinal contours and pulmonary vascularity normal.

Minimal chronic peribronchial thickening.

No acute infiltrate, pleural effusion or pneumothorax.

Bones diffusely demineralized with scattered endplate spur formation
thoracic spine. .

Coronary term stent noted.
IMPRESSION: Enlargement of cardiac silhouette post CABG.

No acute abnormalities.

## 2015-08-26 ENCOUNTER — Emergency Department
Admission: EM | Admit: 2015-08-26 | Discharge: 2015-08-26 | Disposition: A | Discharge status: Home / Self Care | Payer: Medicare Other | Attending: Emergency Medicine | Admitting: Emergency Medicine

## 2015-08-26 ENCOUNTER — Emergency Department: Payer: Medicare Other

## 2015-08-26 DIAGNOSIS — I110 Hypertensive heart disease with heart failure: Secondary | ICD-10-CM | POA: Insufficient documentation

## 2015-08-26 DIAGNOSIS — E785 Hyperlipidemia, unspecified: Secondary | ICD-10-CM | POA: Diagnosis not present

## 2015-08-26 DIAGNOSIS — I4891 Unspecified atrial fibrillation: Secondary | ICD-10-CM | POA: Insufficient documentation

## 2015-08-26 DIAGNOSIS — E119 Type 2 diabetes mellitus without complications: Secondary | ICD-10-CM | POA: Diagnosis not present

## 2015-08-26 DIAGNOSIS — Z79899 Other long term (current) drug therapy: Secondary | ICD-10-CM | POA: Diagnosis not present

## 2015-08-26 DIAGNOSIS — Z7982 Long term (current) use of aspirin: Secondary | ICD-10-CM | POA: Insufficient documentation

## 2015-08-26 DIAGNOSIS — Z7984 Long term (current) use of oral hypoglycemic drugs: Secondary | ICD-10-CM | POA: Insufficient documentation

## 2015-08-26 DIAGNOSIS — I509 Heart failure, unspecified: Secondary | ICD-10-CM | POA: Insufficient documentation

## 2015-08-26 LAB — BASIC METABOLIC PANEL
Anion gap: 10 (ref 5–15)
BUN: 20 mg/dL (ref 6–20)
CHLORIDE: 105 mmol/L (ref 101–111)
CO2: 25 mmol/L (ref 22–32)
Calcium: 8.9 mg/dL (ref 8.9–10.3)
Creatinine, Ser: 0.59 mg/dL (ref 0.44–1.00)
GFR calc Af Amer: >60 mL/min (ref >60)
GFR calc non Af Amer: >60 mL/min (ref >60)
Glucose, Bld: 197 mg/dL — ABNORMAL HIGH (ref 65–99)
POTASSIUM: 3.4 mmol/L — AB (ref 3.5–5.1)
SODIUM: 140 mmol/L (ref 135–145)

## 2015-08-26 LAB — CBC
HEMATOCRIT: 39.1 % (ref 35.0–47.0)
Hemoglobin: 12.8 g/dL (ref 12.0–16.0)
MCH: 27.3 pg (ref 26.0–34.0)
MCHC: 32.8 g/dL (ref 32.0–36.0)
MCV: 83.1 fL (ref 80.0–100.0)
Platelets: 271 10*3/uL (ref 150–440)
RBC: 4.71 MIL/uL (ref 3.80–5.20)
RDW: 14.4 % (ref 11.5–14.5)
WBC: 10.6 10*3/uL (ref 3.6–11.0)

## 2015-08-26 LAB — TROPONIN I: Troponin I: <0.03 ng/mL (ref <0.031)

## 2015-08-26 LAB — PROTIME-INR
INR: 0.98
Prothrombin Time: 13.2 seconds (ref 11.4–15.0)

## 2015-08-26 MED ORDER — DILTIAZEM HCL 30 MG PO TABS
60.0000 mg | ORAL_TABLET | Freq: Once | ORAL | Status: AC
Start: 2015-08-26 — End: 2015-08-26
  Administered 2015-08-26: 60 mg via ORAL
  Filled 2015-08-26: qty 2

## 2015-08-26 MED ORDER — DILTIAZEM HCL 25 MG/5ML IV SOLN
15.0000 mg | Freq: Once | INTRAVENOUS | Status: AC
Start: 2015-08-26 — End: 2015-08-26
  Administered 2015-08-26: 15 mg via INTRAVENOUS
  Filled 2015-08-26: qty 5

## 2015-08-26 MED ORDER — DILTIAZEM HCL 25 MG/5ML IV SOLN
15.0000 mg | Freq: Once | INTRAVENOUS | Status: AC
  Administered 2015-08-26: 15 mg via INTRAVENOUS
  Filled 2015-08-26: qty 5

## 2015-08-26 NOTE — ED Notes (Signed)
Pt given sandwhich tray at this time along with water to drink, pt sitting up in bed eating and tolerating well, NAD noted.

## 2015-08-26 NOTE — ED Notes (Signed)
Pt ambulated to bathroom at this time with no concerns. Pt tolerated well, NAD noted.

## 2015-08-26 NOTE — Discharge Instructions (Signed)
Please seek medical attention for any high fevers, chest pain, shortness of breath, change in behavior, persistent vomiting, bloody stool or any other new or concerning symptoms. ° ° °Atrial Fibrillation °Atrial fibrillation is a type of heartbeat that is irregular or fast (rapid). If you have this condition, your heart keeps quivering in a weird (chaotic) way. This condition can make it so your heart cannot pump blood normally. Having this condition gives a person more risk for stroke, heart failure, and other heart problems. There are different types of atrial fibrillation. Talk with your doctor to learn about the type that you have. °HOME CARE °· Take over-the-counter and prescription medicines only as told by your doctor. °· If your doctor prescribed a blood-thinning medicine, take it exactly as told. Taking too much of it can cause bleeding. If you do not take enough of it, you will not have the protection that you need against stroke and other problems. °· Do not use any tobacco products. These include cigarettes, chewing tobacco, and e-cigarettes. If you need help quitting, ask your doctor. °· If you have apnea (obstructive sleep apnea), manage it as told by your doctor. °· Do not drink alcohol. °· Do not drink beverages that have caffeine. These include coffee, soda, and tea. °· Maintain a healthy weight. Do not use diet pills unless your doctor says they are safe for you. Diet pills may make heart problems worse. °· Follow diet instructions as told by your doctor. °· Exercise regularly as told by your doctor. °· Keep all follow-up visits as told by your doctor. This is important. °GET HELP IF: °· You notice a change in the speed, rhythm, or strength of your heartbeat. °· You are taking a blood-thinning medicine and you notice more bruising. °· You get tired more easily when you move or exercise. °GET HELP RIGHT AWAY IF: °· You have pain in your chest or your belly (abdomen). °· You have sweating or  weakness. °· You feel sick to your stomach (nauseous). °· You notice blood in your throw up (vomit), poop (stool), or pee (urine). °· You are short of breath. °· You suddenly have swollen feet and ankles. °· You feel dizzy. °· Your suddenly get weak or numb in your face, arms, or legs, especially if it happens on one side of your body. °· You have trouble talking, trouble understanding, or both. °· Your face or your eyelid droops on one side. °These symptoms may be an emergency. Do not wait to see if the symptoms will go away. Get medical help right away. Call your local emergency services (911 in the [place] Do not drive yourself to the hospital. °  °This information is not intended to replace advice given to you by your health care provider. Make sure you discuss any questions you have with your health care provider. °  °Document Released: 12/06/2007 Document Revised: 11/17/2014 Document Reviewed: 06/23/2014 °Elsevier Interactive Patient Education ©2016 [employer] ° °

## 2015-08-26 NOTE — ED Notes (Signed)
Pt ambulatory to bathroom at this time with no concerns. Pt tolerating well, NAD noted.

## 2015-08-26 NOTE — ED Notes (Signed)
Reviewed d/c instructions with pt. Discussed pt increasing pt's metoprolol to 50 per MD, monitoring BP, and scheduling follow-up with primary care Monday per MD Derrill Kay order

## 2015-08-26 NOTE — ED Provider Notes (Signed)
[facility] Emergency Department Provider Note   I have reviewed the triage vital signs and the nursing notes.   HISTORY  Chief Complaint Atrial Fibrillation   History limited by: Not Limited   HPI Robin [name] is a 75 y.o. female with history of atrial fibrillation presents to the emergency department today because of concerns for central chest discomfort. She describes it as pressure-like.It started this morning while the patient was sitting down. She denies any abnormal exertion today. She denies any shortness of breath. No recent fevers or cough.    Past Medical History  Diagnosis Date  . Hypertension   . Diabetes mellitus without complication (HCC)   . CHF (congestive heart failure) (HCC)   . Coronary artery disease   . Afib (HCC)   . Anxiety   . Hyperlipidemia     There are no active problems to display for this patient.   History reviewed. No pertinent past surgical history.  Current Outpatient Rx  Name  Route  Sig  Dispense  Refill  . albuterol (ACCUNEB) 1.25 MG/3ML nebulizer solution   Nebulization   Take 1.25 mg by nebulization every 6 (six) hours as needed. For wheezing         . amLODipine (NORVASC) 10 MG tablet   Oral   Take 10 mg by mouth daily.         Marland Kitchen aspirin 81 MG chewable tablet   Oral   Chew 81 mg by mouth daily.         Marland Kitchen atorvastatin (LIPITOR) 40 MG tablet   Oral   Take 40 mg by mouth daily.         . dabigatran (PRADAXA) 150 MG CAPS capsule   Oral   Take 150 mg by mouth daily.         . furosemide (LASIX) 20 MG tablet   Oral   Take 20 mg by mouth 2 (two) times daily.         Marland Kitchen glipiZIDE (GLUCOTROL XL) 10 MG 24 hr tablet   Oral   Take 10 mg by mouth daily.         . metFORMIN (GLUCOPHAGE) 500 MG tablet   Oral   Take 250 mg by mouth 2 (two) times daily.         Marland Kitchen EXPIRED: metoprolol succinate (TOPROL-XL) 25 MG 24 hr tablet   Oral   Take 12.5 mg by mouth daily.         .  nitroGLYCERIN (NITROSTAT) 0.4 MG SL tablet   Sublingual   Place 0.4 mg under the tongue every 5 (five) minutes as needed for chest pain.          . ranitidine (ZANTAC) 150 MG tablet   Oral   Take 150 mg by mouth.           Allergies Review of patient's allergies indicates no known allergies.  No family history on file.  Social History Social History  Substance Use Topics  . Smoking status: Never Smoker   . Smokeless tobacco: None  . Alcohol Use: No    Review of Systems  Constitutional: Negative for fever. Cardiovascular: Positive for chest pressure Respiratory: Negative for shortness of breath. Gastrointestinal: Negative for abdominal pain, vomiting and diarrhea. Neurological: Negative for headaches, focal weakness or numbness.  10-point ROS otherwise negative.  ____________________________________________   PHYSICAL EXAM:  VITAL SIGNS: ED Triage Vitals  Enc Vitals Group     BP 08/26/15 1506 138/85 mmHg  Pulse Rate 08/26/15 1507 132     Resp 08/26/15 1507 17     Temp 08/26/15 1507 97.5 F (36.4 C)     Temp src --      SpO2 08/26/15 1507 96 %     Weight --      Height --      Head Cir --      Peak Flow --      Pain Score 08/26/15 1503 9   Constitutional: Alert and oriented. Well appearing and in no distress. Eyes: Conjunctivae are normal. PERRL. Normal extraocular movements. ENT   Head: Normocephalic and atraumatic.   Nose: No congestion/rhinnorhea.   Mouth/Throat: Mucous membranes are moist.   Neck: No stridor. Hematological/Lymphatic/Immunilogical: No cervical lymphadenopathy. Cardiovascular: Irregularly irregular rhythm. Tachycardic. Respiratory: Normal respiratory effort without tachypnea nor retractions. Breath sounds are clear and equal bilaterally. No wheezes/rales/rhonchi. Gastrointestinal: Soft and nontender. No distention. There is no CVA tenderness. Genitourinary: Deferred Musculoskeletal: Normal range of motion in all  extremities. No joint effusions.  No lower extremity tenderness nor edema. Neurologic:  Normal speech and language. No gross focal neurologic deficits are appreciated.  Skin:  Skin is warm, dry and intact. No rash noted. Psychiatric: Mood and affect are normal. Speech and behavior are normal. Patient exhibits appropriate insight and judgment.  ____________________________________________    LABS (pertinent positives/negatives)  Labs Reviewed  BASIC METABOLIC PANEL - Abnormal; Notable for the following:    Potassium 3.4 (*)    Glucose, Bld 197 (*)    All other components within normal limits  CBC  TROPONIN I  PROTIME-INR  TROPONIN I     ____________________________________________   EKG  I, Phineas Semen, attending physician, personally viewed and interpreted this EKG  EKG Time: 1507 Rate: 115 Rhythm: atrial fibrillation Axis: left axis deviation Intervals: qtc 536 QRS: narrow ST changes: no st elevation Impression: abnormal ekg  ____________________________________________    RADIOLOGY  CXR IMPRESSION: Cardiomegaly without acute disease.  ____________________________________________   PROCEDURES  Procedure(s) performed: None  Critical Care performed: No  ____________________________________________   INITIAL IMPRESSION / ASSESSMENT AND PLAN / ED COURSE  Pertinent labs & imaging results that were available during my care of the patient were reviewed by me and considered in my medical decision making (see chart for details).  Patient presented to the emergency department today because of concerns for chest pressure. Patient was found to be in A. fib with RVR. Patient has known history of A. fib. The patient blood work and chest x-ray without concerning findings. Patient's heart rate did improve with IV diltiazem and oral diltiazem. Patient was at one time on 50 mg of metoprolol. Discussed with patient that she can try going back to the [place]. She was  taken out of 25 mg because of some dizziness. I did instruct the patient to check her blood pressure given the increased dose. She does have a blood pressure cuff at home. Additionally instructed patient to follow-up with primary care. Patient did state that her chest pressure had resolved after heart rate improved.  ____________________________________________   FINAL CLINICAL IMPRESSION(S) / ED DIAGNOSES  Final diagnoses:  Atrial fibrillation with RVR (HCC)     Note: This dictation was prepared with Dragon dictation. Any transcriptional errors that result from this process are unintentional    Phineas Semen, MD 08/26/15 2009

## 2015-08-26 NOTE — ED Notes (Signed)
MD Goodman at bedside. 

## 2015-08-26 NOTE — ED Notes (Signed)
Pt from home via EMS, reports chest pain, upon EMS arrival pt was in A.fib RVR. Pt in no acute distress at this time. Does have Hx of Afib

## 2015-08-26 NOTE — ED Notes (Signed)
Patient transported to X-ray 

## 2015-09-03 IMAGING — CR DG CHEST 2V
1 series · 2 of 2 positions shown · non-contrast
Comparison: DG CHEST 2V dated 05/29/2013

CLINICAL DATA: Upper back pain radiating to shoulders. Intermittent
wheezing.

EXAM:
CHEST  2 VIEW

[Series 1: w chest pa · 0.14mm/px · 2 of 2 slices shown]
[im 1/2]
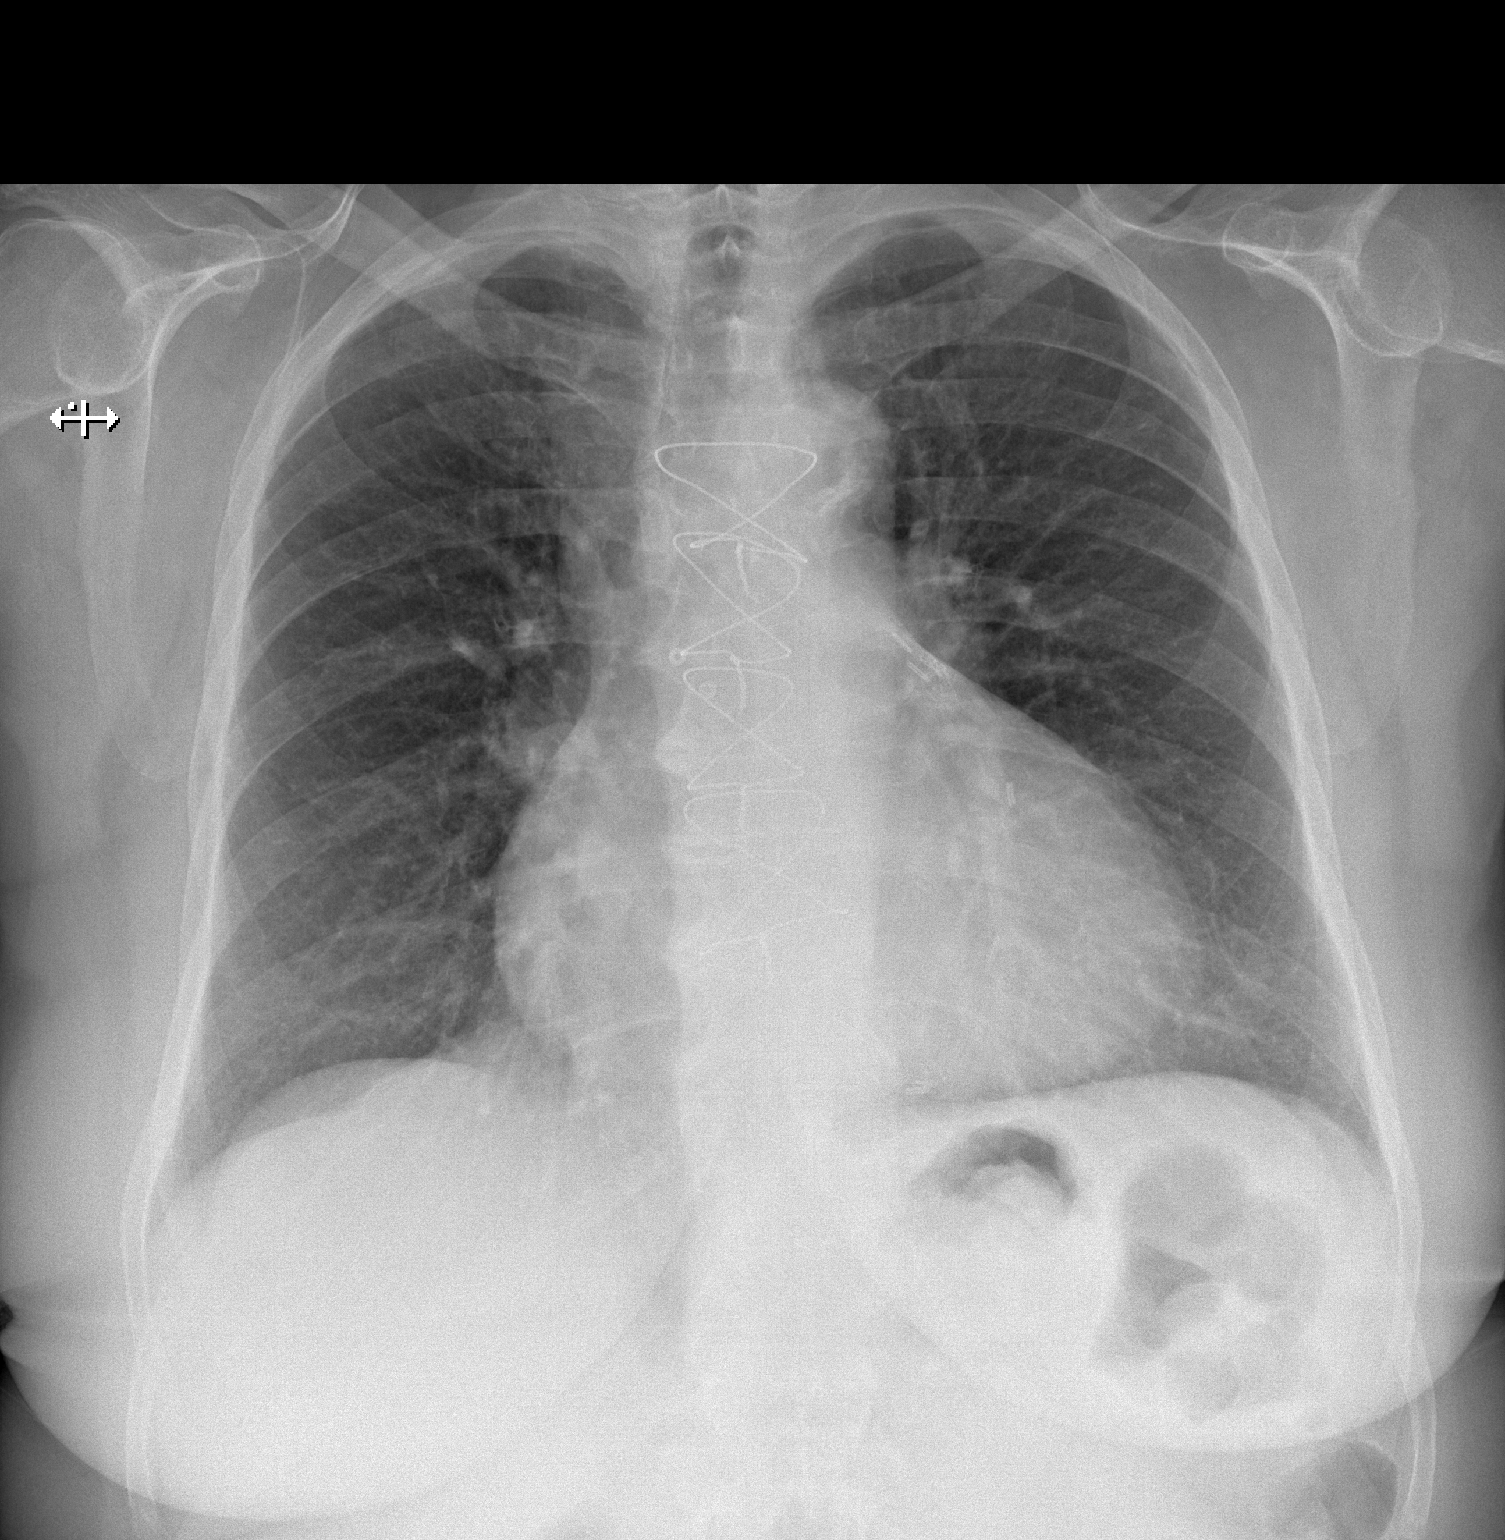
[im 2/2]
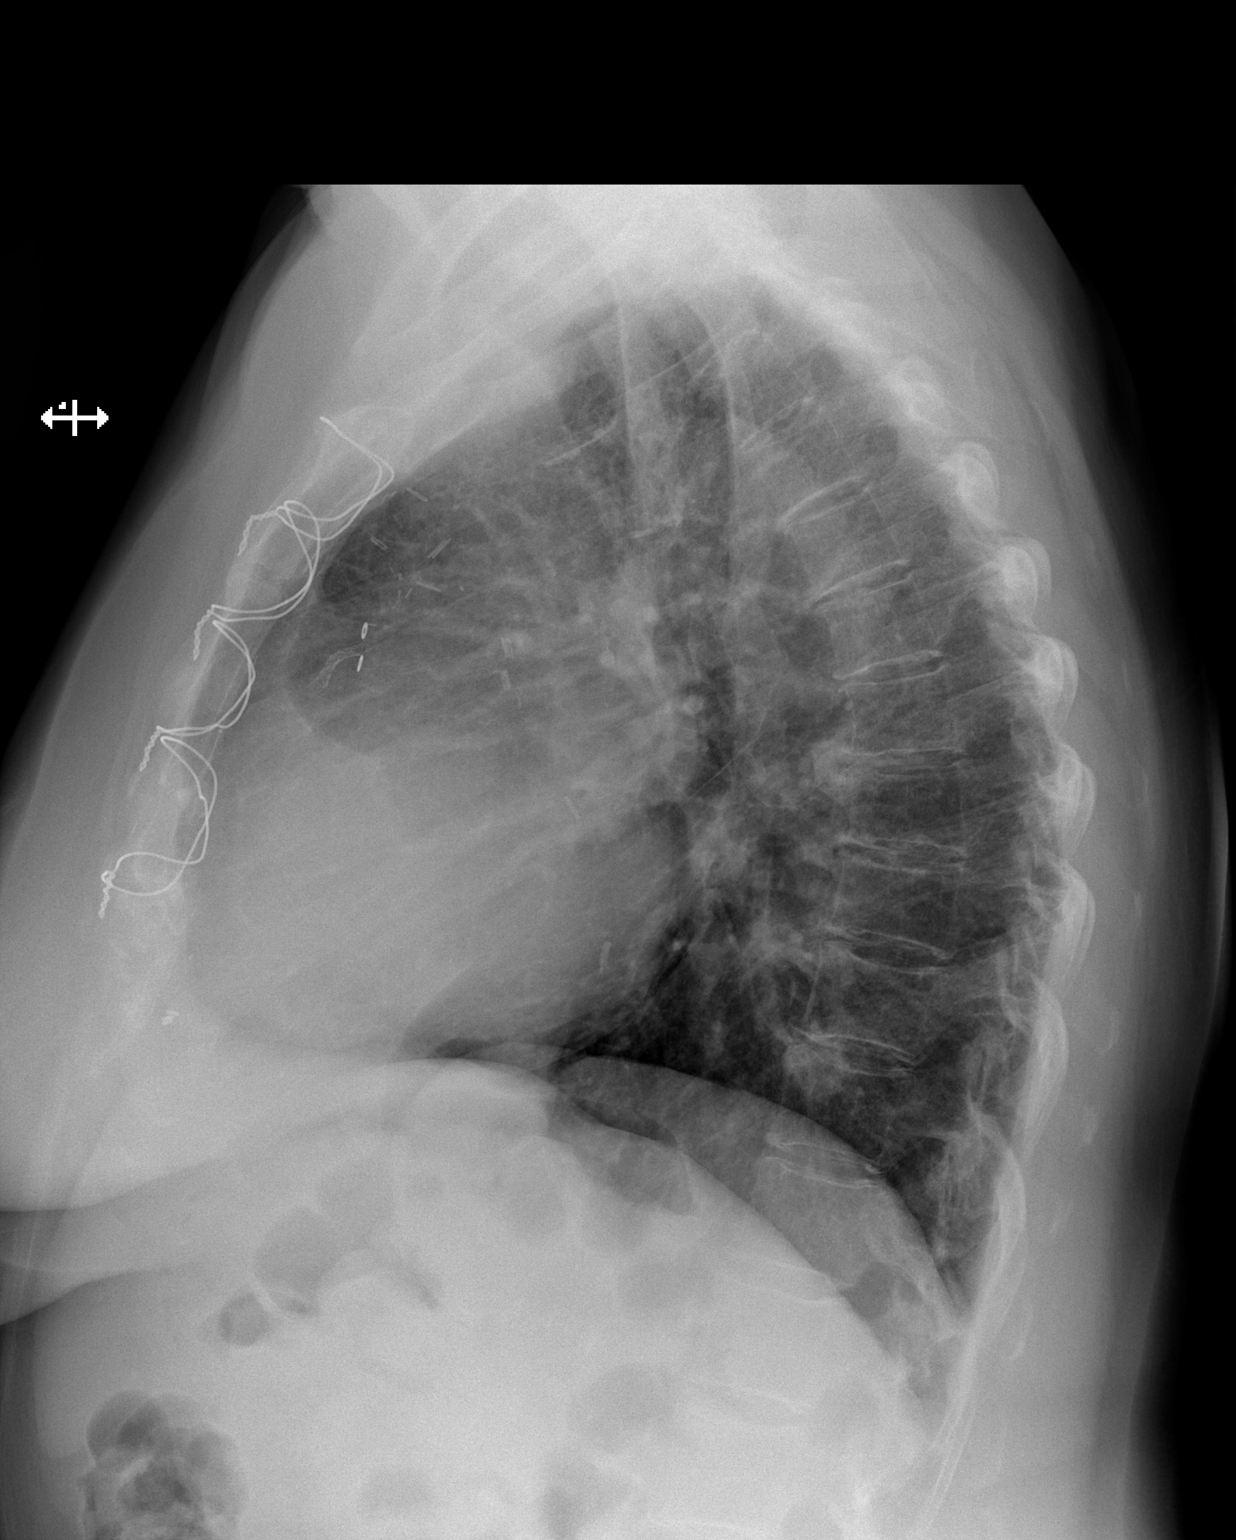

[2 of 2 positions shown; findings below may reference images not displayed]

FINDINGS: Cardiac silhouette appears moderately enlarged, similar. Status post
median sternotomy for apparent coronary artery bypass grafting.
Similar mild chronic interstitial changes, no pleural effusions or
focal consolidations. Trachea projects midline and there is no
pneumothorax.

Osteopenia. No advanced degenerative change of thoracic spine for
age.
IMPRESSION: Stable cardiomegaly and chronic interstitial changes.

  By: Nala Tiger

## 2015-09-13 ENCOUNTER — Emergency Department: Payer: Medicare Other

## 2015-09-13 ENCOUNTER — Inpatient Hospital Stay
Admit: 2015-09-13 | Discharge: 2015-09-13 | Disposition: A | Discharge status: Home / Self Care | Payer: Medicare Other | Attending: Internal Medicine | Admitting: Internal Medicine

## 2015-09-13 ENCOUNTER — Inpatient Hospital Stay
Admission: EM | Admit: 2015-09-13 | Discharge: 2015-09-15 | DRG: 309 | Disposition: A | Discharge status: Home / Self Care | Payer: Medicare Other | Attending: Internal Medicine | Admitting: Internal Medicine

## 2015-09-13 DIAGNOSIS — E785 Hyperlipidemia, unspecified: Secondary | ICD-10-CM | POA: Diagnosis present

## 2015-09-13 DIAGNOSIS — I4892 Unspecified atrial flutter: Secondary | ICD-10-CM | POA: Diagnosis present

## 2015-09-13 DIAGNOSIS — Z951 Presence of aortocoronary bypass graft: Secondary | ICD-10-CM | POA: Diagnosis not present

## 2015-09-13 DIAGNOSIS — I5022 Chronic systolic (congestive) heart failure: Secondary | ICD-10-CM | POA: Diagnosis present

## 2015-09-13 DIAGNOSIS — R079 Chest pain, unspecified: Secondary | ICD-10-CM

## 2015-09-13 DIAGNOSIS — Z8249 Family history of ischemic heart disease and other diseases of the circulatory system: Secondary | ICD-10-CM | POA: Diagnosis not present

## 2015-09-13 DIAGNOSIS — I110 Hypertensive heart disease with heart failure: Secondary | ICD-10-CM | POA: Diagnosis present

## 2015-09-13 DIAGNOSIS — E119 Type 2 diabetes mellitus without complications: Secondary | ICD-10-CM | POA: Diagnosis present

## 2015-09-13 DIAGNOSIS — F419 Anxiety disorder, unspecified: Secondary | ICD-10-CM | POA: Diagnosis present

## 2015-09-13 DIAGNOSIS — Z79891 Long term (current) use of opiate analgesic: Secondary | ICD-10-CM | POA: Diagnosis not present

## 2015-09-13 DIAGNOSIS — I2510 Atherosclerotic heart disease of native coronary artery without angina pectoris: Secondary | ICD-10-CM | POA: Diagnosis present

## 2015-09-13 DIAGNOSIS — Z7982 Long term (current) use of aspirin: Secondary | ICD-10-CM | POA: Diagnosis not present

## 2015-09-13 DIAGNOSIS — I4891 Unspecified atrial fibrillation: Principal | ICD-10-CM | POA: Diagnosis present

## 2015-09-13 DIAGNOSIS — Z79899 Other long term (current) drug therapy: Secondary | ICD-10-CM

## 2015-09-13 LAB — BASIC METABOLIC PANEL
Anion gap: 9 (ref 5–15)
BUN: 21 mg/dL — AB (ref 6–20)
CHLORIDE: 109 mmol/L (ref 101–111)
CO2: 23 mmol/L (ref 22–32)
CREATININE: 0.57 mg/dL (ref 0.44–1.00)
Calcium: 8.6 mg/dL — ABNORMAL LOW (ref 8.9–10.3)
GFR calc Af Amer: >60 mL/min (ref >60)
GLUCOSE: 264 mg/dL — AB (ref 65–99)
Potassium: 3.5 mmol/L (ref 3.5–5.1)
SODIUM: 141 mmol/L (ref 135–145)

## 2015-09-13 LAB — CBC
HCT: 38.2 % (ref 35.0–47.0)
Hemoglobin: 12.4 g/dL (ref 12.0–16.0)
MCH: 27.8 pg (ref 26.0–34.0)
MCHC: 32.4 g/dL (ref 32.0–36.0)
MCV: 85.8 fL (ref 80.0–100.0)
PLATELETS: 296 10*3/uL (ref 150–440)
RBC: 4.45 MIL/uL (ref 3.80–5.20)
RDW: 14.2 % (ref 11.5–14.5)
WBC: 13.5 10*3/uL — AB (ref 3.6–11.0)

## 2015-09-13 LAB — URINALYSIS COMPLETE WITH MICROSCOPIC (ARMC ONLY)
Bacteria, UA: NONE SEEN
Bilirubin Urine: NEGATIVE
Hgb urine dipstick: NEGATIVE
Leukocytes, UA: NEGATIVE
Nitrite: NEGATIVE
Protein, ur: NEGATIVE mg/dL
SPECIFIC GRAVITY, URINE: 1.009 (ref 1.005–1.030)
pH: 7 (ref 5.0–8.0)

## 2015-09-13 LAB — TROPONIN I
Troponin I: <0.03 ng/mL (ref <0.03)
Troponin I: <0.03 ng/mL (ref <0.03)
Troponin I: <0.03 ng/mL (ref <0.03)

## 2015-09-13 LAB — GLUCOSE, CAPILLARY
GLUCOSE-CAPILLARY: 304 mg/dL — AB (ref 65–99)
Glucose-Capillary: 264 mg/dL — ABNORMAL HIGH (ref 65–99)
Glucose-Capillary: 304 mg/dL — ABNORMAL HIGH (ref 65–99)
Glucose-Capillary: 211 mg/dL — ABNORMAL HIGH (ref 65–99)

## 2015-09-13 LAB — TSH: TSH: 2.159 u[IU]/mL (ref 0.350–4.500)

## 2015-09-13 LAB — MAGNESIUM: Magnesium: 2 mg/dL (ref 1.7–2.4)

## 2015-09-13 LAB — MRSA PCR SCREENING: MRSA BY PCR: NEGATIVE

## 2015-09-13 MED ORDER — NITROGLYCERIN 0.4 MG SL SUBL
0.4000 mg | SUBLINGUAL_TABLET | SUBLINGUAL | Status: DC | PRN
  Administered 2015-09-13: 0.4 mg via SUBLINGUAL
  Filled 2015-09-13: qty 1

## 2015-09-13 MED ORDER — OXYCODONE-ACETAMINOPHEN 5-325 MG PO TABS: 1.0000 | ORAL_TABLET | Freq: Three times a day (TID) | ORAL | Status: DC | PRN

## 2015-09-13 MED ORDER — INSULIN ASPART 100 UNIT/ML ~~LOC~~ SOLN
0.0000 [IU] | Freq: Three times a day (TID) | SUBCUTANEOUS | Status: DC
  Administered 2015-09-13: 7 [IU] via SUBCUTANEOUS
  Administered 2015-09-13 – 2015-09-14 (×3): 5 [IU] via SUBCUTANEOUS
  Administered 2015-09-15: 2 [IU] via SUBCUTANEOUS
  Filled 2015-09-13: qty 2
  Filled 2015-09-13: qty 5
  Filled 2015-09-13: qty 1
  Filled 2015-09-13 (×2): qty 5
  Filled 2015-09-13: qty 7
  Filled 2015-09-13: qty 2

## 2015-09-13 MED ORDER — DILTIAZEM HCL 100 MG IV SOLR
5.0000 mg/h | INTRAVENOUS | Status: DC
  Administered 2015-09-13 – 2015-09-14 (×2): 5 mg/h via INTRAVENOUS
  Filled 2015-09-13 (×2): qty 100

## 2015-09-13 MED ORDER — LISINOPRIL 20 MG PO TABS
20.0000 mg | ORAL_TABLET | Freq: Every day | ORAL | Status: DC
  Administered 2015-09-13 – 2015-09-15 (×3): 20 mg via ORAL
  Filled 2015-09-13 (×4): qty 1

## 2015-09-13 MED ORDER — NITROGLYCERIN 2 % TD OINT
1.0000 [in_us] | TOPICAL_OINTMENT | Freq: Once | TRANSDERMAL | Status: AC
  Administered 2015-09-13: 1 [in_us] via TOPICAL
  Filled 2015-09-13: qty 1

## 2015-09-13 MED ORDER — AMLODIPINE BESYLATE 10 MG PO TABS
10.0000 mg | ORAL_TABLET | Freq: Every day | ORAL | Status: DC
  Administered 2015-09-13 – 2015-09-14 (×2): 10 mg via ORAL
  Filled 2015-09-13 (×2): qty 1

## 2015-09-13 MED ORDER — NITROGLYCERIN 0.4 MG SL SUBL: 0.4000 mg | SUBLINGUAL_TABLET | SUBLINGUAL | Status: DC | PRN

## 2015-09-13 MED ORDER — ATORVASTATIN CALCIUM 20 MG PO TABS
80.0000 mg | ORAL_TABLET | Freq: Every day | ORAL | Status: DC
  Administered 2015-09-13 – 2015-09-15 (×3): 80 mg via ORAL
  Filled 2015-09-13 (×3): qty 4

## 2015-09-13 MED ORDER — MECLIZINE HCL 25 MG PO TABS: 25.0000 mg | ORAL_TABLET | Freq: Two times a day (BID) | ORAL | Status: DC | PRN

## 2015-09-13 MED ORDER — LORAZEPAM 2 MG/ML IJ SOLN
1.0000 mg | Freq: Once | INTRAMUSCULAR | Status: AC
  Administered 2015-09-13: 1 mg via INTRAVENOUS
  Filled 2015-09-13: qty 1

## 2015-09-13 MED ORDER — METOPROLOL SUCCINATE ER 25 MG PO TB24
25.0000 mg | ORAL_TABLET | Freq: Every day | ORAL | Status: DC
  Administered 2015-09-13 – 2015-09-15 (×3): 25 mg via ORAL
  Filled 2015-09-13 (×3): qty 1

## 2015-09-13 MED ORDER — DILTIAZEM HCL 100 MG IV SOLR
5.0000 mg/h | Freq: Once | INTRAVENOUS | Status: AC
  Administered 2015-09-13: 5 mg/h via INTRAVENOUS
  Filled 2015-09-13: qty 100

## 2015-09-13 MED ORDER — GLIPIZIDE 5 MG PO TABS
5.0000 mg | ORAL_TABLET | Freq: Every day | ORAL | Status: DC
  Filled 2015-09-13 (×2): qty 1

## 2015-09-13 MED ORDER — ASPIRIN EC 81 MG PO TBEC
81.0000 mg | DELAYED_RELEASE_TABLET | Freq: Every day | ORAL | Status: DC
  Administered 2015-09-13 – 2015-09-14 (×2): 81 mg via ORAL
  Filled 2015-09-13 (×2): qty 1

## 2015-09-13 MED ORDER — HEPARIN SODIUM (PORCINE) 5000 UNIT/ML IJ SOLN
5000.0000 [IU] | Freq: Three times a day (TID) | INTRAMUSCULAR | Status: DC
  Administered 2015-09-13 – 2015-09-15 (×6): 5000 [IU] via SUBCUTANEOUS
  Filled 2015-09-13 (×6): qty 1

## 2015-09-13 MED ORDER — SODIUM CHLORIDE 0.9% FLUSH
3.0000 mL | Freq: Two times a day (BID) | INTRAVENOUS | Status: DC
  Administered 2015-09-13 – 2015-09-14 (×3): 3 mL via INTRAVENOUS

## 2015-09-13 MED ORDER — DILTIAZEM HCL 25 MG/5ML IV SOLN
15.0000 mg | Freq: Once | INTRAVENOUS | Status: AC
  Administered 2015-09-13: 15 mg via INTRAVENOUS
  Filled 2015-09-13: qty 5

## 2015-09-13 MED ORDER — SODIUM CHLORIDE 0.9 % IV BOLUS (SEPSIS)
1000.0000 mL | Freq: Once | INTRAVENOUS | Status: AC
  Administered 2015-09-13: 1000 mL via INTRAVENOUS

## 2015-09-13 MED ORDER — CLOPIDOGREL BISULFATE 75 MG PO TABS
75.0000 mg | ORAL_TABLET | Freq: Every day | ORAL | Status: DC
  Administered 2015-09-13 – 2015-09-14 (×2): 75 mg via ORAL
  Filled 2015-09-13 (×2): qty 1

## 2015-09-13 NOTE — ED Notes (Signed)
Pt states pain unrelieved from nitro tablet. Pt assisted to use bedpan for toileting due to high HR. Urine clear and light yellow.

## 2015-09-13 NOTE — ED Notes (Addendum)
Pt given wet rag and given emotional support. Pt states that she is worried because of her chest pain. PT requesting to speak with MD. MD informed.

## 2015-09-13 NOTE — Progress Notes (Signed)
Inpatient Diabetes Program Recommendations  AACE/ADA: New Consensus Statement on Inpatient Glycemic Control (2015)  Target Ranges:  Prepandial:   less than 140 mg/dL      Peak postprandial:   less than 180 mg/dL (1-2 hours)      Critically ill patients:  140 - 180 mg/dL   Lab Results  Component Value Date   GLUCAP 304* 09/13/2015   HGBA1C 7.7* 09/26/2012  Results for DONENE, LASKO (MRN [id number]) as of 09/13/2015 11:28  Ref. Range 09/13/2015 06:05  Glucose Latest Ref Range: 65-99 mg/dL 865 (H)    Review of Glycemic Control  Diabetes history: DM2 Outpatient Diabetes medications: glipizide 5 mg QAM Current orders for Inpatient glycemic control: glipizide 5 mg QAM, Novolog sensitive tidwc  Needs HgbA1C to assess glycemic control prior to admission.  Inpatient Diabetes Program Recommendations:    Please order HgbA1C to assess glycemic control prior to admission. Add HS correction.  Will continue to follow. Thank you. Ailene Ards, RD, LDN, CDE Inpatient Diabetes Coordinator [phone]

## 2015-09-13 NOTE — ED Notes (Signed)
Pt up to bathroom with stand by assist to urinate and defacate.

## 2015-09-13 NOTE — H&P (Signed)
[facility] - [place] at [facility]   PATIENT NAME: Robin [name]    MR#:  [id number]  DATE OF BIRTH:  [birth year]  DATE OF ADMISSION:  09/13/2015  PRIMARY CARE PHYSICIAN: Leanna Sato, MD   REQUESTING/REFERRING PHYSICIAN: Manson Passey  CHIEF COMPLAINT:   Chief Complaint  Patient presents with  . Chest Pain    HISTORY OF PRESENT ILLNESS: Robin [name]  is a 75 y.o. female with a known history of Hypertension, diabetes, congestive heart failure, coronary artery disease, atrial fibrillation- claims to be taking all her medications on time up until yesterday. Woke up today early morning from sleep with severe chest pain which was central and 8-9 out of 10 and pressure-like, associated with palpitation. She denies any fever or cough. She confirms having some urinary frequency for last few days. He came to ER concerned with this, found to have atrial fibrillation with rapid ventricular response and so given his admission to hospitalist team after failure of injection therapy and starting on Cardizem IV drip.  PAST MEDICAL HISTORY:   Past Medical History  Diagnosis Date  . Hypertension   . Diabetes mellitus without complication (HCC)   . CHF (congestive heart failure) (HCC)   . Coronary artery disease   . Afib (HCC)   . Anxiety   . Hyperlipidemia     PAST SURGICAL HISTORY: History reviewed. No pertinent past surgical history.  SOCIAL HISTORY:  Social History  Substance Use Topics  . Smoking status: Never Smoker   . Smokeless tobacco: Not on file  . Alcohol Use: No    FAMILY HISTORY:  Family History  Problem Relation Age of Onset  . Hypertension Mother     DRUG ALLERGIES: No Known Allergies  REVIEW OF SYSTEMS:   CONSTITUTIONAL: No fever, fatigue or weakness.  EYES: No blurred or double vision.  EARS, NOSE, AND THROAT: No tinnitus or ear pain.  RESPIRATORY: No cough, shortness of breath, wheezing or hemoptysis.  CARDIOVASCULAR: Positive for chest pain, no  orthopnea, edema.  GASTROINTESTINAL: No nausea, vomiting, diarrhea or abdominal pain.  GENITOURINARY: No dysuria, hematuria.  ENDOCRINE: No polyuria, nocturia,  HEMATOLOGY: No anemia, easy bruising or bleeding SKIN: No rash or lesion. MUSCULOSKELETAL: No joint pain or arthritis.   NEUROLOGIC: No tingling, numbness, weakness.  PSYCHIATRY: No anxiety or depression.   MEDICATIONS AT HOME:  Prior to Admission medications   Medication Sig Start Date End Date Taking? Authorizing Provider  albuterol (ACCUNEB) 1.25 MG/3ML nebulizer solution Take 1.25 mg by nebulization every 4 (four) hours as needed for wheezing or shortness of breath.    Yes Historical Provider, MD  albuterol-ipratropium (COMBIVENT) 18-103 MCG/ACT inhaler Inhale 2 puffs into the lungs 4 (four) times daily as needed for wheezing or shortness of breath.   Yes Historical Provider, MD  amLODipine (NORVASC) 10 MG tablet Take 10 mg by mouth daily.   Yes Historical Provider, MD  aspirin EC 81 MG tablet Take 81 mg by mouth daily.   Yes Historical Provider, MD  atorvastatin (LIPITOR) 80 MG tablet Take 80 mg by mouth daily.   Yes Historical Provider, MD  clopidogrel (PLAVIX) 75 MG tablet Take 75 mg by mouth daily.   Yes Historical Provider, MD  glipiZIDE (GLUCOTROL) 5 MG tablet Take 5 mg by mouth daily before breakfast.   Yes Historical Provider, MD  lisinopril (PRINIVIL,ZESTRIL) 20 MG tablet Take 20 mg by mouth daily.   Yes Historical Provider, MD  meclizine (ANTIVERT) 25 MG tablet Take 25 mg by mouth 2 (  two) times daily as needed for dizziness.   Yes Historical Provider, MD  metoprolol succinate (TOPROL-XL) 25 MG 24 hr tablet Take 25 mg by mouth daily.    Yes Historical Provider, MD  nitroGLYCERIN (NITROSTAT) 0.4 MG SL tablet Place 0.4 mg under the tongue every 5 (five) minutes as needed for chest pain.    Yes Historical Provider, MD  oxyCODONE-acetaminophen (PERCOCET/ROXICET) 5-325 MG tablet Take 1 tablet by mouth every 8 (eight) hours as  needed for moderate pain or severe pain.   Yes Historical Provider, MD      PHYSICAL EXAMINATION:   VITAL SIGNS: Blood pressure 147/92, pulse 134, temperature 98 F (36.7 C), resp. rate 20, SpO2 96 %.  GENERAL:  75 y.o.-year-old patient lying in the bed with no acute distress.  EYES: Pupils equal, round, reactive to light and accommodation. No scleral icterus. Extraocular muscles intact.  HEENT: Head atraumatic, normocephalic. Oropharynx and nasopharynx clear.  NECK:  Supple, no jugular venous distention. No thyroid enlargement, no tenderness.  LUNGS: Normal breath sounds bilaterally, no wheezing, rales,rhonchi or crepitation. No use of accessory muscles of respiration.  CARDIOVASCULAR: S1, S2 normal, tachycardia. No murmurs, rubs, or gallops.  ABDOMEN: Soft, nontender, nondistended. Bowel sounds present. No organomegaly or mass.  EXTREMITIES: No pedal edema, cyanosis, or clubbing.  NEUROLOGIC: Cranial nerves II through XII are intact. Muscle strength 5/5 in all extremities. Sensation intact. Gait not checked.  PSYCHIATRIC: The patient is alert and oriented x 3.  SKIN: No obvious rash, lesion, or ulcer.   LABORATORY PANEL:   CBC  Recent Labs Lab 09/13/15 0605  WBC 13.5*  HGB 12.4  HCT 38.2  PLT 296  MCV 85.8  MCH 27.8  MCHC 32.4  RDW 14.2   ------------------------------------------------------------------------------------------------------------------  Chemistries   Recent Labs Lab 09/13/15 0605  NA 141  K 3.5  CL 109  CO2 23  GLUCOSE 264*  BUN 21*  CREATININE 0.57  CALCIUM 8.6*  MG 2.0   ------------------------------------------------------------------------------------------------------------------ CrCl cannot be calculated (Unknown ideal weight.). ------------------------------------------------------------------------------------------------------------------ No results for input(s): TSH, T4TOTAL, T3FREE, THYROIDAB in the last 72 hours.  Invalid  input(s): FREET3   Coagulation profile No results for input(s): INR, PROTIME in the last 168 hours. ------------------------------------------------------------------------------------------------------------------- No results for input(s): DDIMER in the last 72 hours. -------------------------------------------------------------------------------------------------------------------  Cardiac Enzymes  Recent Labs Lab 09/13/15 0605  TROPONINI <0.03   ------------------------------------------------------------------------------------------------------------------ Invalid input(s): POCBNP  ---------------------------------------------------------------------------------------------------------------  Urinalysis    Component Value Date/Time   COLORURINE COLORLESS* 09/13/2015 0818   COLORURINE Yellow 03/19/2014 1738   APPEARANCEUR CLEAR* 09/13/2015 0818   APPEARANCEUR Clear 03/19/2014 1738   LABSPEC 1.009 09/13/2015 0818   LABSPEC 1.018 03/19/2014 1738   PHURINE 7.0 09/13/2015 0818   PHURINE 5.0 03/19/2014 1738   GLUCOSEU >500* 09/13/2015 0818   GLUCOSEU Negative 03/19/2014 1738   HGBUR NEGATIVE 09/13/2015 0818   HGBUR Negative 03/19/2014 1738   BILIRUBINUR NEGATIVE 09/13/2015 0818   BILIRUBINUR Negative 03/19/2014 1738   KETONESUR 1+* 09/13/2015 0818   KETONESUR Negative 03/19/2014 1738   PROTEINUR NEGATIVE 09/13/2015 0818   PROTEINUR 30 mg/dL 62/13/0865 7846   NITRITE NEGATIVE 09/13/2015 0818   NITRITE Negative 03/19/2014 1738   LEUKOCYTESUR NEGATIVE 09/13/2015 0818   LEUKOCYTESUR Negative 03/19/2014 1738     RADIOLOGY: Dg Chest Port 1 View  09/13/2015  CLINICAL DATA:  Acute onset of sternal chest pain. Initial encounter. EXAM: PORTABLE CHEST 1 VIEW COMPARISON:  Chest radiograph performed 08/26/2015 FINDINGS: The lungs are well-aerated. Pulmonary vascularity is at the upper limits  of normal. Minimal left basilar atelectasis is noted. There is no evidence of pleural  effusion or pneumothorax. The cardiomediastinal silhouette is mildly enlarged. The patient is status post median sternotomy, with evidence of prior CABG. No acute osseous abnormalities are seen. IMPRESSION: Mild cardiomegaly noted. Minimal left basilar atelectasis noted. Lungs otherwise grossly clear. Electronically Signed   By: Roanna Raider M.D.   On: 09/13/2015 07:00    EKG: Orders placed or performed during the hospital encounter of 09/13/15  . EKG 12-Lead  . EKG 12-Lead  . ED EKG within 10 minutes  . ED EKG within 10 minutes   A. fib with rapid ventricular response up to 1:30 to 140.  IMPRESSION AND PLAN: * Atrial flutter fibrillation with rapid ventricular response   Started on Cardizem IV drip and will monitor in stepdown unit.   We'll monitor serial troponin.   She is not on anticoagulation currently.   I will leave it up to cardiologist to decide starting her on any anticoagulation.   I spoke to on call cardiologist.   Check magnesium and TSH level for now. Get echocardiogram.  * Diabetes  Continue home meds and keep on insulin sliding scale coverage.  * Hypertension   Continue home meds, currently blood pressure is running higher.   We will continue monitoring with IV Cardizem drip and her oral medications.  * Hyperlipidemia   Continue statin.  All the records are reviewed and case discussed with ED provider. Management plans discussed with the patient, family and they are in agreement.  CODE STATUS: Full code Code Status History    This patient does not have a recorded code status. Please follow your organizational policy for patients in this situation.     Patient does not have a living will currently but she would like her daughter to make decisions for her. I called her daughter and her son on numbers provided in the chart and left a message from both of them to call back.  TOTAL TIME TAKING CARE OF THIS PATIENT: 50 critical care minutes.    Altamese Dilling M.D on 09/13/2015   Between 7am to 6pm - Pager - [phone]  After 6pm go to [email or web address] - password [email or web address]  [facility]  Office  [phone]  CC: Primary care physician; Leanna Sato, MD   Note: This dictation was prepared with Dragon dictation along with smaller phrase technology. Any transcriptional errors that result from this process are unintentional.

## 2015-09-13 NOTE — Consult Note (Signed)
[facility] Cardiology  CARDIOLOGY CONSULT NOTE  Patient ID: Robin [name] MRN: [id number] DOB/AGE: [birth year] 75 y.o.  Admit date: 09/13/2015 Referring Physician Elisabeth Pigeon Primary Physician [facility] Primary Cardiologist Fath Reason for Consultation Atrial fibrillation  HPI: 75 year old female referred for evaluation of atrial fibrillation. The patient has known coronary artery disease, status post CABG. She has known history of paroxysmal atrial fibrillation, followed by Dr.Fath, on Xarelto for stroke prevention, and amiodarone for rhythm control. She presents to [facility] emergency room with chest pain and palpitations, ECG reveals atrial fibrillation with a rapid ventricular rate. Patient was treated with Cardizem bolus and drip. Initial labs were notable for negative troponin. The patient was given Ativan for agitation and currently is somnolent and difficult to arouse. Patient has had a recent history of dysuria. Admission labs were  also notable for elevated white count. Urinalysis reveals WBCs suggestive of urinary tract infection.  Review of systems complete and found to be negative unless listed above     Past Medical History  Diagnosis Date  . Hypertension   . Diabetes mellitus without complication (HCC)   . CHF (congestive heart failure) (HCC)   . Coronary artery disease   . Afib (HCC)   . Anxiety   . Hyperlipidemia     History reviewed. No pertinent past surgical history.  Prescriptions prior to admission  Medication Sig Dispense Refill Last Dose  . albuterol (ACCUNEB) 1.25 MG/3ML nebulizer solution Take 1.25 mg by nebulization every 4 (four) hours as needed for wheezing or shortness of breath.    prn at prn  . albuterol-ipratropium (COMBIVENT) 18-103 MCG/ACT inhaler Inhale 2 puffs into the lungs 4 (four) times daily as needed for wheezing or shortness of breath.   prn at prn  . amLODipine (NORVASC) 10 MG tablet Take 10 mg by mouth daily.   09/12/2015 at Unknown time  . aspirin EC 81 MG  tablet Take 81 mg by mouth daily.   09/12/2015 at Unknown time  . atorvastatin (LIPITOR) 80 MG tablet Take 80 mg by mouth daily.   09/12/2015 at Unknown time  . clopidogrel (PLAVIX) 75 MG tablet Take 75 mg by mouth daily.   09/12/2015 at Unknown time  . glipiZIDE (GLUCOTROL) 5 MG tablet Take 5 mg by mouth daily before breakfast.   09/12/2015 at Unknown time  . lisinopril (PRINIVIL,ZESTRIL) 20 MG tablet Take 20 mg by mouth daily.   09/12/2015 at Unknown time  . meclizine (ANTIVERT) 25 MG tablet Take 25 mg by mouth 2 (two) times daily as needed for dizziness.   prn at prn  . metoprolol succinate (TOPROL-XL) 25 MG 24 hr tablet Take 25 mg by mouth daily.    09/12/2015 at prn  . nitroGLYCERIN (NITROSTAT) 0.4 MG SL tablet Place 0.4 mg under the tongue every 5 (five) minutes as needed for chest pain.    prn at prn  . oxyCODONE-acetaminophen (PERCOCET/ROXICET) 5-325 MG tablet Take 1 tablet by mouth every 8 (eight) hours as needed for moderate pain or severe pain.   prn at prn   Social History   Social History  . Marital Status: Single    Spouse Name: N/A  . Number of Children: N/A  . Years of Education: N/A   Occupational History  . Not on file.   Social History Main Topics  . Smoking status: Never Smoker   . Smokeless tobacco: Not on file  . Alcohol Use: No  . Drug Use: No  . Sexual Activity: No   Other Topics Concern  .  Not on file   Social History Narrative    Family History  Problem Relation Age of Onset  . Hypertension Mother       Review of systems complete and found to be negative unless listed above      PHYSICAL EXAM  General: Well developed, well nourished, in no acute distress HEENT:  Normocephalic and atramatic Neck:  No JVD.  Lungs: Clear bilaterally to auscultation and percussion. Heart: HRRR . Normal S1 and S2 without gallops or murmurs.  Abdomen: Bowel sounds are positive, abdomen soft and non-tender  Msk:  Back normal, normal gait. Normal strength and tone for  age. Extremities: No clubbing, cyanosis or edema.   Neuro: Alert and oriented X 3. Psych:  Good affect, responds appropriately  Labs:   Lab Results  Component Value Date   WBC 13.5* 09/13/2015   HGB 12.4 09/13/2015   HCT 38.2 09/13/2015   MCV 85.8 09/13/2015   PLT 296 09/13/2015    Recent Labs Lab 09/13/15 0605  NA 141  K 3.5  CL 109  CO2 23  BUN 21*  CREATININE 0.57  CALCIUM 8.6*  GLUCOSE 264*   Lab Results  Component Value Date   CKTOTAL 79 02/11/2014   CKMB 1.3 02/11/2014   TROPONINI <0.03 09/13/2015    Lab Results  Component Value Date   CHOL 229* 02/11/2014   CHOL 254* 09/26/2012   CHOL 211* 07/06/2012   Lab Results  Component Value Date   HDL 49 02/11/2014   HDL 55 09/26/2012   HDL 49 07/06/2012   Lab Results  Component Value Date   LDLCALC 156* 02/11/2014   LDLCALC 166* 09/26/2012   LDLCALC 128* 07/06/2012   Lab Results  Component Value Date   TRIG 120 02/11/2014   TRIG 166 09/26/2012   TRIG 170 07/06/2012   No results found for: CHOLHDL No results found for: LDLDIRECT    Radiology: Dg Chest 2 View  08/26/2015  CLINICAL DATA:  Atrial fibrillation with rapid ventricular response. Initial encounter. EXAM: CHEST  2 VIEW COMPARISON:  PA and lateral chest 07/20/2014 and 05/07/2014. FINDINGS: The patient is status post CABG. There is cardiomegaly without edema. Lungs are clear. No pneumothorax or pleural effusion. IMPRESSION: Cardiomegaly without acute disease. Electronically Signed   By: Drusilla Kanner M.D.   On: 08/26/2015 15:56   Dg Chest Port 1 View  09/13/2015  CLINICAL DATA:  Acute onset of sternal chest pain. Initial encounter. EXAM: PORTABLE CHEST 1 VIEW COMPARISON:  Chest radiograph performed 08/26/2015 FINDINGS: The lungs are well-aerated. Pulmonary vascularity is at the upper limits of normal. Minimal left basilar atelectasis is noted. There is no evidence of pleural effusion or pneumothorax. The cardiomediastinal silhouette is mildly  enlarged. The patient is status post median sternotomy, with evidence of prior CABG. No acute osseous abnormalities are seen. IMPRESSION: Mild cardiomegaly noted. Minimal left basilar atelectasis noted. Lungs otherwise grossly clear. Electronically Signed   By: Roanna Raider M.D.   On: 09/13/2015 07:00    EKG: Atrial fibrillation with a rapid ventricular rate  ASSESSMENT AND PLAN:   1. Atrial fibrillation with a rapid ventricular rate, improved on Cardizem bolus and drip 2. Chest pain, known history of coronary disease, status post CABG, with negative troponin 3. Possible UTI  Recommendations   1. Agree with current therapy 2. Continue Cardizem drip 3. Continue Xarelto for stroke prevention 4. Continue amiodarone for rhythm control 5. Further recommendations pending patient's initial clinical course   Signed: Delonta Yohannes MD,PhD, [facility]  09/13/2015, 10:42 AM

## 2015-09-13 NOTE — ED Notes (Signed)
Pt resting in bed with eyes closed at this time. Pt responds appropriately. Pt toileted in bedpan, approx 75cc of urine.

## 2015-09-13 NOTE — ED Notes (Signed)
Secretary sent phone call to [facility], RN who is in patient room. Return phone call in 5 minutes per RN.

## 2015-09-13 NOTE — ED Notes (Signed)
Pt presents to ED via [facility] for chest pain that began around 4am. Pt got up to use the bathroom and chest pain began. Pt states 10/10 pain, sharp, central chest. Denies radiation. Pt states dizziness and diaphoresis, states "I almost passed out." Per EMS pt was ambulatory upon arrival. Pt requested to use restroom upon arrival, ambulatory to toilet. Per EMS pt was a fib (highest HR 150's), EMS gave 1 SL nitro, 10mg  of cardizem. Pt was released recently from [facility] for R kidney problems. Pt has hx of anxiety.

## 2015-09-13 NOTE — ED Provider Notes (Signed)
[facility] Emergency Department Provider Note   ____________________________________________  Time seen: Approximately 600 AM  I have reviewed the triage vital signs and the nursing notes.   HISTORY  Chief Complaint Chest Pain    HPI Robin [name] is a 75 y.o. female who comes into the hospital today with some chest pain and fast heart rate. The patient reports that she was sleeping when she got up and went to the bathroom. She reports that she started sweating and had some pain in her chest. She also reports her heart was beating fast. She has had this happen in the past. The patient reports that she felt faint and shortness of breath. The patient did not have loss of consciousness. She reports that the pain in her chest and sharp in a 10 out of 10 in intensity. The patient also is having some nausea. She was concerned so she decided to come into the hospital. The patient has no abdominal pain no vomiting no blurry vision or headache. The patient is here for evaluation.   Past Medical History  Diagnosis Date  . Hypertension   . Diabetes mellitus without complication (HCC)   . CHF (congestive heart failure) (HCC)   . Coronary artery disease   . Afib (HCC)   . Anxiety   . Hyperlipidemia     There are no active problems to display for this patient.   History reviewed. No pertinent past surgical history.  Current Outpatient Rx  Name  Route  Sig  Dispense  Refill  . albuterol (ACCUNEB) 1.25 MG/3ML nebulizer solution   Nebulization   Take 1.25 mg by nebulization every 4 (four) hours as needed for wheezing or shortness of breath.          Marland Kitchen albuterol-ipratropium (COMBIVENT) 18-103 MCG/ACT inhaler   Inhalation   Inhale 2 puffs into the lungs 4 (four) times daily as needed for wheezing or shortness of breath.         Marland Kitchen amLODipine (NORVASC) 10 MG tablet   Oral   Take 10 mg by mouth daily.         Marland Kitchen aspirin EC 81 MG tablet   Oral   Take  81 mg by mouth daily.         Marland Kitchen atorvastatin (LIPITOR) 80 MG tablet   Oral   Take 80 mg by mouth daily.         . clopidogrel (PLAVIX) 75 MG tablet   Oral   Take 75 mg by mouth daily.         Marland Kitchen glipiZIDE (GLUCOTROL) 5 MG tablet   Oral   Take 5 mg by mouth daily before breakfast.         . lisinopril (PRINIVIL,ZESTRIL) 20 MG tablet   Oral   Take 20 mg by mouth daily.         . meclizine (ANTIVERT) 25 MG tablet   Oral   Take 25 mg by mouth 2 (two) times daily as needed for dizziness.         . metoprolol succinate (TOPROL-XL) 25 MG 24 hr tablet   Oral   Take 25 mg by mouth daily.          . nitroGLYCERIN (NITROSTAT) 0.4 MG SL tablet   Sublingual   Place 0.4 mg under the tongue every 5 (five) minutes as needed for chest pain.          Marland Kitchen oxyCODONE-acetaminophen (PERCOCET/ROXICET) 5-325 MG tablet   Oral  Take 1 tablet by mouth every 8 (eight) hours as needed for moderate pain or severe pain.           Allergies Review of patient's allergies indicates no known allergies.  History reviewed. No pertinent family history.  Social History Social History  Substance Use Topics  . Smoking status: Never Smoker   . Smokeless tobacco: None  . Alcohol Use: No    Review of Systems Constitutional: Sweats with No fever/chills Eyes: No visual changes. ENT: No sore throat. Cardiovascular:chest pain, Tachycardia Respiratory:  shortness of breath. Gastrointestinal: No abdominal pain.  Genitourinary: Negative for dysuria. Musculoskeletal: Negative for back pain. Skin: Negative for rash. Neurological: Negative for headaches, focal weakness or numbness.  10-point ROS otherwise negative.  ____________________________________________   PHYSICAL EXAM:  VITAL SIGNS: ED Triage Vitals  Enc Vitals Group     BP 09/13/15 0600 158/83 mmHg     Pulse Rate 09/13/15 0600 97     Resp 09/13/15 0600 25     Temp 09/13/15 0602 98 F (36.7 C)     Temp src --      SpO2  09/13/15 0600 94 %     Weight --      Height --      Head Cir --      Peak Flow --      Pain Score 09/13/15 0602 10     Pain Loc --      Pain Edu? --      Excl. in GC? --     Constitutional: Alert and oriented. Well appearing and in Moderate distress. Eyes: Conjunctivae are normal. PERRL. EOMI. Head: Atraumatic. Nose: No congestion/rhinnorhea. Mouth/Throat: Mucous membranes are moist.  Oropharynx non-erythematous. Cardiovascular: Tachycardia, irregular regular rhythm. Grossly normal heart sounds.  Good peripheral circulation. Respiratory: Normal respiratory effort.  No retractions. Lungs CTAB. Gastrointestinal: Soft and nontender. No distention. Positive bowel sounds Musculoskeletal: No lower extremity tenderness nor edema.   Neurologic:  Normal speech and language.  Skin:  Skin is warm, dry and intact.  Psychiatric: Mood and affect are normal.   ____________________________________________   LABS (all labs ordered are listed, but only abnormal results are displayed)  Labs Reviewed  BASIC METABOLIC PANEL - Abnormal; Notable for the following:    Glucose, Bld 264 (*)    BUN 21 (*)    Calcium 8.6 (*)    All other components within normal limits  CBC - Abnormal; Notable for the following:    WBC 13.5 (*)    All other components within normal limits  TROPONIN I  MAGNESIUM  URINALYSIS COMPLETEWITH MICROSCOPIC ([facility] ONLY)   ____________________________________________  EKG  ED ECG REPORT I, Rebecka Apley, the attending physician, personally viewed and interpreted this ECG.   Date: 09/13/2015  EKG Time: 553  Rate: 116  Rhythm: atrial fibrillation, rate 116  Axis: left axis  Intervals:prolonged qtc  ST&T Change: normal  ____________________________________________  RADIOLOGY  CXR: Mild cardiomegaly noted, minimal left basilar atelectasis noted, lungs otherwise grossly clear. ____________________________________________   PROCEDURES  Procedure(s)  performed: None  Procedures  Critical Care performed: Yes, see critical care note(s)   CRITICAL CARE Performed by: Lucrezia Europe P   Total critical care time: 30 minutes  Critical care time was exclusive of separately billable procedures and treating other patients.  Critical care was necessary to treat or prevent imminent or life-threatening deterioration.  Critical care was time spent personally by me on the following activities: development of treatment plan with  patient and/or surrogate as well as nursing, discussions with consultants, evaluation of patient's response to treatment, examination of patient, obtaining history from patient or surrogate, ordering and performing treatments and interventions, ordering and review of laboratory studies, ordering and review of radiographic studies, pulse oximetry and re-evaluation of patient's condition.   ____________________________________________   INITIAL IMPRESSION / ASSESSMENT AND PLAN / ED COURSE  Pertinent labs & imaging results that were available during my care of the patient were reviewed by me and considered in my medical decision making (see chart for details).  This is a 75 -year-old female who comes into the hospital today with A. fib with RVR. The patient did receive 10 mg of Cardizem in the EMS. I did give the patient another 15 mg of Cardizem as well as a liter of normal saline. The patient remains tachycardic in the 120s to 140s. I will place the patient on a Cardizem drip. She is complaining of pain so I will give her a nitroglycerin sublingually. The patient will be admitted to the hospitalist service. The patient will also receive nitropaste to her chest. The patient did receive a dose of Ativan. She will be admitted to the hospitalist service. ____________________________________________   FINAL CLINICAL IMPRESSION(S) / ED DIAGNOSES  Final diagnoses:  Atrial fibrillation with rapid ventricular response (HCC)       NEW MEDICATIONS STARTED DURING THIS VISIT:  New Prescriptions   No medications on file     Note:  This document was prepared using Dragon voice recognition software and may include unintentional dictation errors.    Rebecka Apley, MD 09/13/15 [phone]

## 2015-09-13 NOTE — Progress Notes (Signed)
*  PRELIMINARY RESULTS* Echocardiogram 2D Echocardiogram has been performed.  Robin [name] 09/13/2015, 12:10 PM

## 2015-09-14 LAB — CBC
HEMATOCRIT: 34 % — AB (ref 35.0–47.0)
HEMOGLOBIN: 11.5 g/dL — AB (ref 12.0–16.0)
MCH: 28.2 pg (ref 26.0–34.0)
MCHC: 33.7 g/dL (ref 32.0–36.0)
MCV: 83.6 fL (ref 80.0–100.0)
Platelets: 290 10*3/uL (ref 150–440)
RBC: 4.07 MIL/uL (ref 3.80–5.20)
RDW: 14.4 % (ref 11.5–14.5)
WBC: 10.3 10*3/uL (ref 3.6–11.0)

## 2015-09-14 LAB — BASIC METABOLIC PANEL
ANION GAP: 6 (ref 5–15)
BUN: 27 mg/dL — ABNORMAL HIGH (ref 6–20)
CHLORIDE: 110 mmol/L (ref 101–111)
CO2: 27 mmol/L (ref 22–32)
Calcium: 8.5 mg/dL — ABNORMAL LOW (ref 8.9–10.3)
Creatinine, Ser: 0.78 mg/dL (ref 0.44–1.00)
GFR calc Af Amer: >60 mL/min (ref >60)
GLUCOSE: 177 mg/dL — AB (ref 65–99)
POTASSIUM: 3.5 mmol/L (ref 3.5–5.1)
Sodium: 143 mmol/L (ref 135–145)

## 2015-09-14 LAB — GLUCOSE, CAPILLARY
GLUCOSE-CAPILLARY: 282 mg/dL — AB (ref 65–99)
Glucose-Capillary: 169 mg/dL — ABNORMAL HIGH (ref 65–99)
Glucose-Capillary: 290 mg/dL — ABNORMAL HIGH (ref 65–99)
Glucose-Capillary: 167 mg/dL — ABNORMAL HIGH (ref 65–99)

## 2015-09-14 MED ORDER — DILTIAZEM HCL 60 MG PO TABS
60.0000 mg | ORAL_TABLET | Freq: Three times a day (TID) | ORAL | Status: DC
  Administered 2015-09-14 – 2015-09-15 (×3): 60 mg via ORAL
  Filled 2015-09-14 (×3): qty 1

## 2015-09-14 MED ORDER — METFORMIN HCL 500 MG PO TABS
500.0000 mg | ORAL_TABLET | Freq: Two times a day (BID) | ORAL | Status: DC
  Administered 2015-09-14 – 2015-09-15 (×2): 500 mg via ORAL
  Filled 2015-09-14 (×2): qty 1

## 2015-09-14 MED ORDER — AMIODARONE HCL 200 MG PO TABS: 400.0000 mg | ORAL_TABLET | Freq: Every day | ORAL | Status: DC

## 2015-09-14 NOTE — Progress Notes (Signed)
Received patient from ICU to room 246, patient alert and oriented, denies any pain, family at bedside, no iv access and patient refused to an iv inserted.

## 2015-09-14 NOTE — Progress Notes (Addendum)
Inpatient Diabetes Program Recommendations  AACE/ADA: New Consensus Statement on Inpatient Glycemic Control (2015)  Target Ranges:  Prepandial:   less than 140 mg/dL      Peak postprandial:   less than 180 mg/dL (1-2 hours)      Critically ill patients:  140 - 180 mg/dL   Lab Results  Component Value Date   GLUCAP 169* 09/14/2015   HGBA1C 7.7* 09/26/2012    Review of Glycemic Control   Results for [name], [name] (MRN [id number]) as of 09/14/2015 10:17  Ref. Range 09/13/2015 09:49 09/13/2015 12:25 09/13/2015 16:38 09/13/2015 22:07 09/14/2015 07:03  Glucose-Capillary Latest Ref Range: 65-99 mg/dL 944 (H) 967 (H) 591 (H) 211 (H) 169 (H)    Diabetes history: DM2 Outpatient Diabetes medications: Glipizide 5 mg QAM Current orders for Inpatient glycemic control: Glipizide 5 mg QAM, Novolog sensitive tid, Glucophage 500mg  bid  Needs HgbA1C to assess glycemic control prior to admission.  Inpatient Diabetes Program Recommendations:     Per ADA recommendations "consider performing an A1C on all patients with diabetes or hyperglycemia admitted to the hospital if not performed in the prior 3 months"- last A1C was in 2014.    Consider adding Novolog correction scale at hs.   Patient refusing diabetes medications- please stress the importance of ideal blood control for overall health.   Robin Racer, RN, BA, MHA, CDE Diabetes Coordinator Inpatient Diabetes Program  [phone] (Team Pager) [phone] [facility] Office) 09/14/2015 10:27 AM

## 2015-09-14 NOTE — Care Management (Signed)
Admitted to icu stepdown from home with chest pain. Found to be in atrial fib with rvr.  Cardizem drip.  Patient followed by [facility].  Agency aware of admission.

## 2015-09-14 NOTE — Progress Notes (Signed)
[employer] - [place] at [facility]   PATIENT NAME: Robin [name]    MR#:  [id number]  DATE OF BIRTH:  [birth year]  SUBJECTIVE:  CHIEF COMPLAINT:   Chief Complaint  Patient presents with  . Chest Pain   Came with chest pain , with A fib and RVR.    On cardizem drip and now rate is controlled.   Feels much better.  REVIEW OF SYSTEMS:  CONSTITUTIONAL: No fever, fatigue or weakness.  EYES: No blurred or double vision.  EARS, NOSE, AND THROAT: No tinnitus or ear pain.  RESPIRATORY: No cough, shortness of breath, wheezing or hemoptysis.  CARDIOVASCULAR: No chest pain, orthopnea, edema.  GASTROINTESTINAL: No nausea, vomiting, diarrhea or abdominal pain.  GENITOURINARY: No dysuria, hematuria.  ENDOCRINE: No polyuria, nocturia,  HEMATOLOGY: No anemia, easy bruising or bleeding SKIN: No rash or lesion. MUSCULOSKELETAL: No joint pain or arthritis.   NEUROLOGIC: No tingling, numbness, weakness.  PSYCHIATRY: No anxiety or depression.   ROS  DRUG ALLERGIES:  No Known Allergies  VITALS:  Blood pressure 135/72, pulse 89, temperature 97.8 F (36.6 C), temperature source Oral, resp. rate 18, height 5\' 4"  (1.626 m), weight 59 kg (130 lb 1.1 oz), SpO2 95 %.  PHYSICAL EXAMINATION:  GENERAL:  75 y.o.-year-old patient lying in the bed with no acute distress.  EYES: Pupils equal, round, reactive to light and accommodation. No scleral icterus. Extraocular muscles intact.  HEENT: Head atraumatic, normocephalic. Oropharynx and nasopharynx clear.  NECK:  Supple, no jugular venous distention. No thyroid enlargement, no tenderness.  LUNGS: Normal breath sounds bilaterally, no wheezing, rales,rhonchi or crepitation. No use of accessory muscles of respiration.  CARDIOVASCULAR: S1, S2 irregular. No murmurs, rubs, or gallops.  ABDOMEN: Soft, nontender, nondistended. Bowel sounds present. No organomegaly or mass.  EXTREMITIES: No pedal edema, cyanosis, or clubbing.  NEUROLOGIC:  Cranial nerves II through XII are intact. Muscle strength 5/5 in all extremities. Sensation intact. Gait not checked.  PSYCHIATRIC: The patient is alert and oriented x 3.  SKIN: No obvious rash, lesion, or ulcer.   Physical Exam LABORATORY PANEL:   CBC  Recent Labs Lab 09/14/15 0259  WBC 10.3  HGB 11.5*  HCT 34.0*  PLT 290   ------------------------------------------------------------------------------------------------------------------  Chemistries   Recent Labs Lab 09/13/15 0605 09/14/15 0259  NA 141 143  K 3.5 3.5  CL 109 110  CO2 23 27  GLUCOSE 264* 177*  BUN 21* 27*  CREATININE 0.57 0.78  CALCIUM 8.6* 8.5*  MG 2.0  --    ------------------------------------------------------------------------------------------------------------------  Cardiac Enzymes  Recent Labs Lab 09/13/15 1240 09/13/15 1647  TROPONINI <0.03 <0.03   ------------------------------------------------------------------------------------------------------------------  RADIOLOGY:  Dg Chest Port 1 View  09/13/2015  CLINICAL DATA:  Acute onset of sternal chest pain. Initial encounter. EXAM: PORTABLE CHEST 1 VIEW COMPARISON:  Chest radiograph performed 08/26/2015 FINDINGS: The lungs are well-aerated. Pulmonary vascularity is at the upper limits of normal. Minimal left basilar atelectasis is noted. There is no evidence of pleural effusion or pneumothorax. The cardiomediastinal silhouette is mildly enlarged. The patient is status post median sternotomy, with evidence of prior CABG. No acute osseous abnormalities are seen. IMPRESSION: Mild cardiomegaly noted. Minimal left basilar atelectasis noted. Lungs otherwise grossly clear. Electronically Signed   By: Roanna Raider M.D.   On: 09/13/2015 07:00    ASSESSMENT AND PLAN:   Principal Problem:   Atrial fibrillation with RVR (HCC)  * Atrial flutter fibrillation with rapid ventricular response  Started on Cardizem IV drip and rate  is controlled  now.   Switch to oral cardizem and transfer to tele.  stable serial troponin.  She is not on anticoagulation currently.  I will leave it up to cardiologist to decide starting her on any anticoagulation.  Checked magnesium and TSH level for now- normal.    Echo done.  * Diabetes Continue home meds and keep on insulin sliding scale coverage.  * Hypertension  Continue home meds, currently blood pressure is running higher.  We will continue monitoring with IV Cardizem drip and her oral medications.  * Hyperlipidemia  Continue statin.  Pt will like to have prescription of all her meds for 1 month- as she does not have appointment with her PMD for now.  All the records are reviewed and case discussed with Care Management/Social Workerr. Management plans discussed with the patient, family and they are in agreement.  CODE STATUS: Full.  TOTAL TIME TAKING CARE OF THIS PATIENT: 35 minutes.     POSSIBLE D/C IN 1-2 DAYS, DEPENDING ON CLINICAL CONDITION.   Altamese Dilling M.D on 09/14/2015   Between 7am to 6pm - Pager - [phone]  After 6pm go to [email or web address] - password [email or web address]  [employer]  Office  [phone]  CC: Primary care physician; Leanna Sato, MD  Note: This dictation was prepared with Dragon dictation along with smaller phrase technology. Any transcriptional errors that result from this process are unintentional.

## 2015-09-14 NOTE — Progress Notes (Signed)
Patient's PIV infiltrated with hard, painful reddened area around insertion site.  Cardizem gtt discontinued.  Peripheral IV removed by Diannia Ruder, NT.  Patient refused for another PIV to be inserted.  I notified patient's primary RN, Ladona Ridgel of this.

## 2015-09-15 LAB — ECHOCARDIOGRAM COMPLETE
AO mean calculated velocity dopler: 180 cm/s
AOASC: 30 cm
AV Peak grad: 26 mmHg
AV pk vel: 255 cm/s
AVG: 15 mmHg
FS: 25 % — AB (ref 28–44)
Height: 64 in
IVS/LV PW RATIO, ED: 0.78
LA diam end sys: 43 mm
LA diam index: 2.64 cm/m2
LASIZE: 43 mm
LVOT area: 3.46 cm2
LVOTD: 21 mm
PW: 15.6 mm — AB (ref 0.6–1.1)
VTI: 59.3 cm
WEIGHTICAEL: 2081.14 [oz_av]

## 2015-09-15 LAB — GLUCOSE, CAPILLARY
GLUCOSE-CAPILLARY: 133 mg/dL — AB (ref 65–99)
Glucose-Capillary: 191 mg/dL — ABNORMAL HIGH (ref 65–99)

## 2015-09-15 MED ORDER — DILTIAZEM HCL ER COATED BEADS 180 MG PO CP24: 180.0000 mg | ORAL_CAPSULE | Freq: Every day | ORAL | Status: DC

## 2015-09-15 MED ORDER — AMLODIPINE BESYLATE 10 MG PO TABS: 10.0000 mg | ORAL_TABLET | Freq: Every day | ORAL | Status: DC

## 2015-09-15 MED ORDER — RIVAROXABAN 20 MG PO TABS: 20.0000 mg | ORAL_TABLET | Freq: Every day | ORAL | Status: DC

## 2015-09-15 MED ORDER — METFORMIN HCL 500 MG PO TABS: 500.0000 mg | ORAL_TABLET | Freq: Two times a day (BID) | ORAL | Status: DC

## 2015-09-15 MED ORDER — LISINOPRIL 20 MG PO TABS: 20.0000 mg | ORAL_TABLET | Freq: Every day | ORAL | Status: DC

## 2015-09-15 MED ORDER — METOPROLOL SUCCINATE ER 25 MG PO TB24: 25.0000 mg | ORAL_TABLET | Freq: Every day | ORAL | Status: DC

## 2015-09-15 MED ORDER — CIPROFLOXACIN HCL 500 MG PO TABS: 500.0000 mg | ORAL_TABLET | Freq: Two times a day (BID) | ORAL | Status: AC

## 2015-09-15 MED ORDER — ATORVASTATIN CALCIUM 80 MG PO TABS: 80.0000 mg | ORAL_TABLET | Freq: Every day | ORAL | Status: DC

## 2015-09-15 MED ORDER — RIVAROXABAN 20 MG PO TABS
20.0000 mg | ORAL_TABLET | Freq: Every day | ORAL | Status: DC
  Administered 2015-09-15: 20 mg via ORAL
  Filled 2015-09-15: qty 1

## 2015-09-15 NOTE — Clinical Documentation Improvement (Signed)
Cardiology Internal Medicine  Can the diagnosis of Atrial Flutter be further specified by type? Thank you     Atypical (Type II)  Typical (Type I)  Other  Clinically Undetermined  Document any associated diagnoses/conditions.  Acute diastolic heart failure, Afib/Aflutter   Supporting Information: chest pain    Treatment: IV Cardizem, Xarelto started    Please exercise your independent, professional judgment when responding. A specific answer is not anticipated or expected.   Thank You,  Lavonda Jumbo [facility] Information Management [place] [phone]

## 2015-09-15 NOTE — Progress Notes (Signed)
[facility]  SUBJECTIVE: I don't have chest pain   Filed Vitals:   09/14/15 1746 09/14/15 2028 09/14/15 2031 09/15/15 0402  BP: 172/48 171/65 176/61 138/69  Pulse: 63 61 64 71  Temp: 97.4 F (36.3 C) 98.5 F (36.9 C)  98.2 F (36.8 C)  TempSrc: Oral Oral    Resp: 18 16  16   Height:      Weight:      SpO2: 96% 98%  93%     Intake/Output Summary (Last 24 hours) at 09/15/15 0857 Last data filed at 09/14/15 2044  Gross per 24 hour  Intake 281.25 ml  Output      0 ml  Net 281.25 ml      PHYSICAL EXAM  General: Well developed, well nourished, in no acute distress HEENT:  Normocephalic and atramatic Neck:  No JVD.  Lungs: Clear bilaterally to auscultation and percussion. Heart: HRRR . Normal S1 and S2 without gallops or murmurs.  Abdomen: Bowel sounds are positive, abdomen soft and non-tender  Msk:  Back normal, normal gait. Normal strength and tone for age. Extremities: No clubbing, cyanosis or edema.   Neuro: Alert and oriented X 3. Psych:  Good affect, responds appropriately   LABS: Basic Metabolic Panel:  Recent Labs  34/03/52 0605 09/14/15 0259  NA 141 143  K 3.5 3.5  CL 109 110  CO2 23 27  GLUCOSE 264* 177*  BUN 21* 27*  CREATININE 0.57 0.78  CALCIUM 8.6* 8.5*  MG 2.0  --    Liver Function Tests: No results for input(s): AST, ALT, ALKPHOS, BILITOT, PROT, ALBUMIN in the last 72 hours. No results for input(s): LIPASE, AMYLASE in the last 72 hours. CBC:  Recent Labs  09/13/15 0605 09/14/15 0259  WBC 13.5* 10.3  HGB 12.4 11.5*  HCT 38.2 34.0*  MCV 85.8 83.6  PLT 296 290   Cardiac Enzymes:  Recent Labs  09/13/15 0902 09/13/15 1240 09/13/15 1647  TROPONINI <0.03 <0.03 <0.03   BNP: Invalid input(s): POCBNP D-Dimer: No results for input(s): DDIMER in the last 72 hours. Hemoglobin A1C: No results for input(s): HGBA1C in the last 72 hours. Fasting Lipid Panel: No results for input(s): CHOL, HDL, LDLCALC, TRIG, CHOLHDL, LDLDIRECT in  the last 72 hours. Thyroid Function Tests:  Recent Labs  09/13/15 0605  TSH 2.159   Anemia Panel: No results for input(s): VITAMINB12, FOLATE, FERRITIN, TIBC, IRON, RETICCTPCT in the last 72 hours.  No results found.   Echo mildly reduced left ventricular function with LVEF 45-50%, with mild to moderate aortic stenosis  TELEMETRY: Atrial fibrillation with controlled ventricular rate:  ASSESSMENT AND PLAN:  Principal Problem:   Atrial fibrillation with RVR (HCC)    1. Atrial fibrillation, rate controlled on current meds, chads Vasc of 6, previously on Xarelto 2. Chest pain, without recurrence, status post CABG, with negative troponin  Recommendations  1. Restart Xarelto 2. DC aspirin and Plavix 3. Continue metoprolol for rate control  Sign off for now, please call if any questions   Lamees Gable, MD, PhD, [facility] 09/15/2015 8:57 AM

## 2015-09-15 NOTE — Discharge Summary (Signed)
Robin [name], 75 y.o., DOB October 15, 1940, MRN [id number]. Admission date: 09/13/2015 Discharge Date 09/15/2015 Primary MD Leanna Sato, MD Admitting Physician Altamese Dilling, MD  Admission Diagnosis  Atrial fibrillation with rapid ventricular response [facility]) [I48.91] Chest pain [R07.9]  Discharge Diagnosis   Principal Problem:   Atrial fibrillation with RVR (HCC)  Diabetes type 2  Hypertension Hyperlipidemia Coronary artery disease Anxiety Hyperlipidemia       Hospital Course  Robin [name] is a 75 y.o. female with a known history of Hypertension, diabetes, congestive heart failure, coronary artery disease, atrial fibrillation. Who woke up with having severe chest pain and was noted to have atrial fibrillation with rapid ventricular rate. Patient had to be started on Cardizem drip. She has a history of having atrial fibrillation. On further questioning patient reported that she ran out of some of her medications including metoprolol and blood pressure medications. She is currently controlled with metoprolol and Cardizem heart rate is better. She was seen by cardiology and started on Xarelto. Her Plavix was recommended to be stopped. She is doing much better prescriptions for medications that she did not have at home or field.            Consults  cardiology  Significant Tests:  See full reports for all details      Dg Chest 2 View  08/26/2015  CLINICAL DATA:  Atrial fibrillation with rapid ventricular response. Initial encounter. EXAM: CHEST  2 VIEW COMPARISON:  PA and lateral chest 07/20/2014 and 05/07/2014. FINDINGS: The patient is status post CABG. There is cardiomegaly without edema. Lungs are clear. No pneumothorax or pleural effusion. IMPRESSION: Cardiomegaly without acute disease. Electronically Signed   By: Drusilla Kanner M.D.   On: 08/26/2015 15:56   Dg Chest Port 1 View  09/13/2015  CLINICAL DATA:  Acute onset of sternal chest pain. Initial encounter. EXAM:  PORTABLE CHEST 1 VIEW COMPARISON:  Chest radiograph performed 08/26/2015 FINDINGS: The lungs are well-aerated. Pulmonary vascularity is at the upper limits of normal. Minimal left basilar atelectasis is noted. There is no evidence of pleural effusion or pneumothorax. The cardiomediastinal silhouette is mildly enlarged. The patient is status post median sternotomy, with evidence of prior CABG. No acute osseous abnormalities are seen. IMPRESSION: Mild cardiomegaly noted. Minimal left basilar atelectasis noted. Lungs otherwise grossly clear. Electronically Signed   By: Roanna Raider M.D.   On: 09/13/2015 07:00       Today   Subjective:   Robin [name]  Feels better  Objective:   Blood pressure 144/64, pulse 79, temperature 97.8 F (36.6 C), temperature source Oral, resp. rate 20, height 5\' 4"  (1.626 m), weight 59 kg (130 lb 1.1 oz), SpO2 97 %.  .  Intake/Output Summary (Last 24 hours) at 09/15/15 1300 Last data filed at 09/15/15 0934  Gross per 24 hour  Intake 258.17 ml  Output      0 ml  Net 258.17 ml    Exam VITAL SIGNS: Blood pressure 144/64, pulse 79, temperature 97.8 F (36.6 C), temperature source Oral, resp. rate 20, height 5\' 4"  (1.626 m), weight 59 kg (130 lb 1.1 oz), SpO2 97 %.  GENERAL:  75 y.o.-year-old patient lying in the bed with no acute distress.  EYES: Pupils equal, round, reactive to light and accommodation. No scleral icterus. Extraocular muscles intact.  HEENT: Head atraumatic, normocephalic. Oropharynx and nasopharynx clear.  NECK:  Supple, no jugular venous distention. No thyroid enlargement, no tenderness.  LUNGS: Normal breath sounds bilaterally, no wheezing, rales,rhonchi or crepitation.  No use of accessory muscles of respiration.  CARDIOVASCULAR: Irregular irregular. No murmurs, rubs, or gallops.  ABDOMEN: Soft, nontender, nondistended. Bowel sounds present. No organomegaly or mass.  EXTREMITIES: No pedal edema, cyanosis, or clubbing.  NEUROLOGIC: Cranial  nerves II through XII are intact. Muscle strength 5/5 in all extremities. Sensation intact. Gait not checked.  PSYCHIATRIC: The patient is alert and oriented x 3.  SKIN: No obvious rash, lesion, or ulcer.   Data Review     CBC w Diff: Lab Results  Component Value Date   WBC 10.3 09/14/2015   WBC 19.0* 02/10/2014   HGB 11.5* 09/14/2015   HGB 12.5 02/10/2014   HCT 34.0* 09/14/2015   HCT 40.0 02/10/2014   PLT 290 09/14/2015   PLT 296 02/10/2014   LYMPHOPCT 25.9 10/23/2013   MONOPCT 6.4 10/23/2013   EOSPCT 1.0 10/23/2013   BASOPCT 1.2 10/23/2013   CMP: Lab Results  Component Value Date   NA 143 09/14/2015   NA 141 02/10/2014   K 3.5 09/14/2015   K 3.6 02/10/2014   CL 110 09/14/2015   CL 107 02/10/2014   CO2 27 09/14/2015   CO2 22 02/10/2014   BUN 27* 09/14/2015   BUN 21* 02/10/2014   CREATININE 0.78 09/14/2015   CREATININE 0.69 02/10/2014   PROT 6.9 12/29/2013   ALBUMIN 3.4 12/29/2013   BILITOT 0.2 12/29/2013   ALKPHOS 122* 12/29/2013   AST 24 12/29/2013   ALT 28 12/29/2013  .  Micro Results Recent Results (from the past 240 hour(s))  MRSA PCR Screening     Status: None   Collection Time: 09/13/15  9:54 AM  Result Value Ref Range Status   MRSA by PCR NEGATIVE NEGATIVE Final    Comment:        The GeneXpert MRSA Assay (FDA approved for NASAL specimens only), is one component of a comprehensive MRSA colonization surveillance program. It is not intended to diagnose MRSA infection nor to guide or monitor treatment for MRSA infections.         Code Status Orders        Start     Ordered   09/13/15 1011  Full code   Continuous     09/13/15 1010    Code Status History    Date Active Date Inactive Code Status Order ID Comments User Context   This patient has a current code status but no historical code status.          Follow-up Information    Follow up with [facility].   Specialty:  Home Health Services   Why:   alternate phone number- [phone].  Nurse Physical Therapy, Occupational Therapy, Social worker   Contact information:   [address] [place] [address] [phone]       Follow up with Leanna Sato, MD. Go on 10/11/2015.   Specialty:  Family Medicine   Why:  Time: 5:50 p.m.   Contact information:   [address] [place] [address] [phone]       Discharge Medications     Medication List    STOP taking these medications        clopidogrel 75 MG tablet  Commonly known as:  PLAVIX      TAKE these medications        albuterol 1.25 MG/3ML nebulizer solution  Commonly known as:  ACCUNEB  Take 1.25 mg by nebulization every 4 (four) hours as needed  for wheezing or shortness of breath.     albuterol-ipratropium 18-103 MCG/ACT inhaler  Commonly known as:  COMBIVENT  Inhale 2 puffs into the lungs 4 (four) times daily as needed for wheezing or shortness of breath.     amLODipine 10 MG tablet  Commonly known as:  NORVASC  Take 1 tablet (10 mg total) by mouth daily.     aspirin EC 81 MG tablet  Take 81 mg by mouth daily.     atorvastatin 80 MG tablet  Commonly known as:  LIPITOR  Take 1 tablet (80 mg total) by mouth daily.     ciprofloxacin 500 MG tablet  Commonly known as:  CIPRO  Take 1 tablet (500 mg total) by mouth 2 (two) times daily.     diltiazem 180 MG 24 hr capsule  Commonly known as:  CARDIZEM CD  Take 1 capsule (180 mg total) by mouth daily.     glipiZIDE 5 MG tablet  Commonly known as:  GLUCOTROL  Take 5 mg by mouth daily before breakfast.     lisinopril 20 MG tablet  Commonly known as:  PRINIVIL,ZESTRIL  Take 1 tablet (20 mg total) by mouth daily.     meclizine 25 MG tablet  Commonly known as:  ANTIVERT  Take 25 mg by mouth 2 (two) times daily as needed for dizziness.     metFORMIN 500 MG tablet  Commonly known as:  GLUCOPHAGE  Take 1 tablet (500 mg total) by mouth 2 (two) times daily with a meal.     metoprolol  succinate 25 MG 24 hr tablet  Commonly known as:  TOPROL-XL  Take 1 tablet (25 mg total) by mouth daily.     nitroGLYCERIN 0.4 MG SL tablet  Commonly known as:  NITROSTAT  Place 0.4 mg under the tongue every 5 (five) minutes as needed for chest pain.     oxyCODONE-acetaminophen 5-325 MG tablet  Commonly known as:  PERCOCET/ROXICET  Take 1 tablet by mouth every 8 (eight) hours as needed for moderate pain or severe pain.     rivaroxaban 20 MG Tabs tablet  Commonly known as:  XARELTO  Take 1 tablet (20 mg total) by mouth daily.           Total Time in preparing paper work, data evaluation and todays exam - 35 minutes  Auburn Bilberry M.D on 09/15/2015 at 1:00 [facility]  [facility]   Office  [phone]

## 2015-09-15 NOTE — Discharge Instructions (Signed)
°  DIET:  °Cardiac diet, diabetic diet ° °DISCHARGE CONDITION:  °Stable ° °ACTIVITY:  °Activity as tolerated ° °OXYGEN:  °Home Oxygen: No. °  °Oxygen Delivery: room air ° °DISCHARGE LOCATION:  °home  ° ° °ADDITIONAL DISCHARGE INSTRUCTION: ° ° °If you experience worsening of your admission symptoms, develop shortness of breath, life threatening emergency, suicidal or homicidal thoughts you must seek medical attention immediately by calling 911 or calling your MD immediately  if symptoms less severe. ° °You Must read complete instructions/literature along with all the possible adverse reactions/side effects for all the Medicines you take and that have been prescribed to you. Take any new Medicines after you have completely understood and accpet all the possible adverse reactions/side effects.  ° °Please note ° °You were cared for by a hospitalist during your hospital stay. If you have any questions about your discharge medications or the care you received while you were in the hospital after you are discharged, you can call the unit and asked to speak with the hospitalist on call if the hospitalist that took care of you is not available. Once you are discharged, your primary care physician will handle any further medical issues. Please note that NO REFILLS for any discharge medications will be authorized once you are discharged, as it is imperative that you return to your primary care physician (or establish a relationship with a primary care physician if you do not have one) for your aftercare needs so that they can reassess your need for medications and monitor your lab values. ° ° °

## 2015-09-15 NOTE — Care Management (Signed)
Patient says she does not think [facility] knows that she moved when services were ordered.  Says has not been seen.  Address is [address] [place].  Same phone number- [phone].  This is the residence of " a family member."  Patient says she has transportation issues.  Discussed [employer].  She says she has trouble  "going through the process" and asks for assistance.  Discussed adding social work consult to home health order and she is agreeable.   Updated [place] with [facility] and anticipate patient to discharge home today.

## 2015-09-15 NOTE — Clinical Documentation Improvement (Signed)
Cardiology Internal Medicine    Per Echo report reason: "Acute Diastolic  CHF "  Was Acute Diastolic CHF ........?     Yes,  Present on Admission  No, Not Present on Admission  Unable to Determine  Other condition     Please exercise your independent, professional judgment when responding. A specific answer is not anticipated or expected.   Thank You, Lavonda Jumbo Health Information Management [place] [phone]

## 2015-09-15 NOTE — Progress Notes (Signed)
Inpatient Diabetes Program Recommendations  AACE/ADA: New Consensus Statement on Inpatient Glycemic Control (2015)  Target Ranges:  Prepandial:   less than 140 mg/dL      Peak postprandial:   less than 180 mg/dL (1-2 hours)      Critically ill patients:  140 - 180 mg/dL   Lab Results  Component Value Date   GLUCAP 133* 09/15/2015   HGBA1C 7.7* 09/26/2012    Review of Glycemic Control  Results for Robin [name], Robin [name] (MRN [id number]) as of 09/15/2015 09:46  Ref. Range 09/14/2015 07:03 09/14/2015 11:08 09/14/2015 16:06 09/14/2015 20:53 09/15/2015 07:31  Glucose-Capillary Latest Ref Range: 65-99 mg/dL 263 (H) 785 (H) 885 (H) 167 (H) 133 (H)   Diabetes history: DM2 Outpatient Diabetes medications: Glipizide 5 mg QAM Current orders for Inpatient glycemic control: Glipizide 5 mg QAM, Novolog sensitive tid, Glucophage 500mg  bid   Inpatient Diabetes Program Recommendations:  Per ADA recommendations "consider performing an A1C on all patients with diabetes or hyperglycemia admitted to the hospital if not performed in the prior 3 months"- last A1C was in 2014.   Patient continues to intermittently refuse diabetes medications- when she does take it, her blood sugars are better.   Susette Racer, RN, BA, MHA, CDE Diabetes Coordinator Inpatient Diabetes Program  [phone] (Team Pager) [phone] [facility] Office) 09/15/2015 9:51 AM

## 2015-09-15 NOTE — Care Management (Signed)
Informed that patient address concerns about paying for her medications- specifically the Xarelto.  Patient verbalized to CM that she does not have concerns about costs of medications.  She does verbalize concerns regarding side effects of Xarelto.  "I was on it before and so was my daughter and she started bleeding.  She told me that and I stopped it."  Patient repeatedly denied issues with purchase of meds.

## 2015-09-15 NOTE — Care Management Important Message (Signed)
Important Message  Patient Details  Name: Corryn Lotspeich MRN: [id number] Date of Birth: 1940/12/25   Medicare Important Message Given:  Yes    Olegario Messier A Bennie Scaff 09/15/2015, 10:30 AM

## 2015-09-15 NOTE — Progress Notes (Signed)
Patient discharge home, discharge instruction provided, iv removed tele removed.

## 2015-09-15 NOTE — Clinical Documentation Improvement (Signed)
Cardiology Internal Medicine  Can the diagnosis of Atrial Fibrillation be further specified by type? Thank you    Chronic Atrial fibrillation  Paroxysmal Atrial fibrillation  Permanent Atrial fibrillation  Persistent Atrial fibrillation  Other  Clinically Undetermined  Document any associated diagnoses/conditions. Acute Diastolic heart failure , HTN    Supporting Information: presents with chest pain and atrial fibrillation and atrial flutter   Treatment: started on Xarelto     Please exercise your independent, professional judgment when responding. A specific answer is not anticipated or expected.   Thank You,  Lavonda Jumbo Health Information Management  [phone]

## 2015-09-17 IMAGING — CR DG CHEST 2V
1 series · 2 of 2 positions shown · non-contrast
Comparison: 06/21/2013

CLINICAL DATA: LEFT chest pain

EXAM:
CHEST  2 VIEW

[Series 3: w chest pa · 0.14mm/px · 2 of 2 slices shown]
[im 1/2]
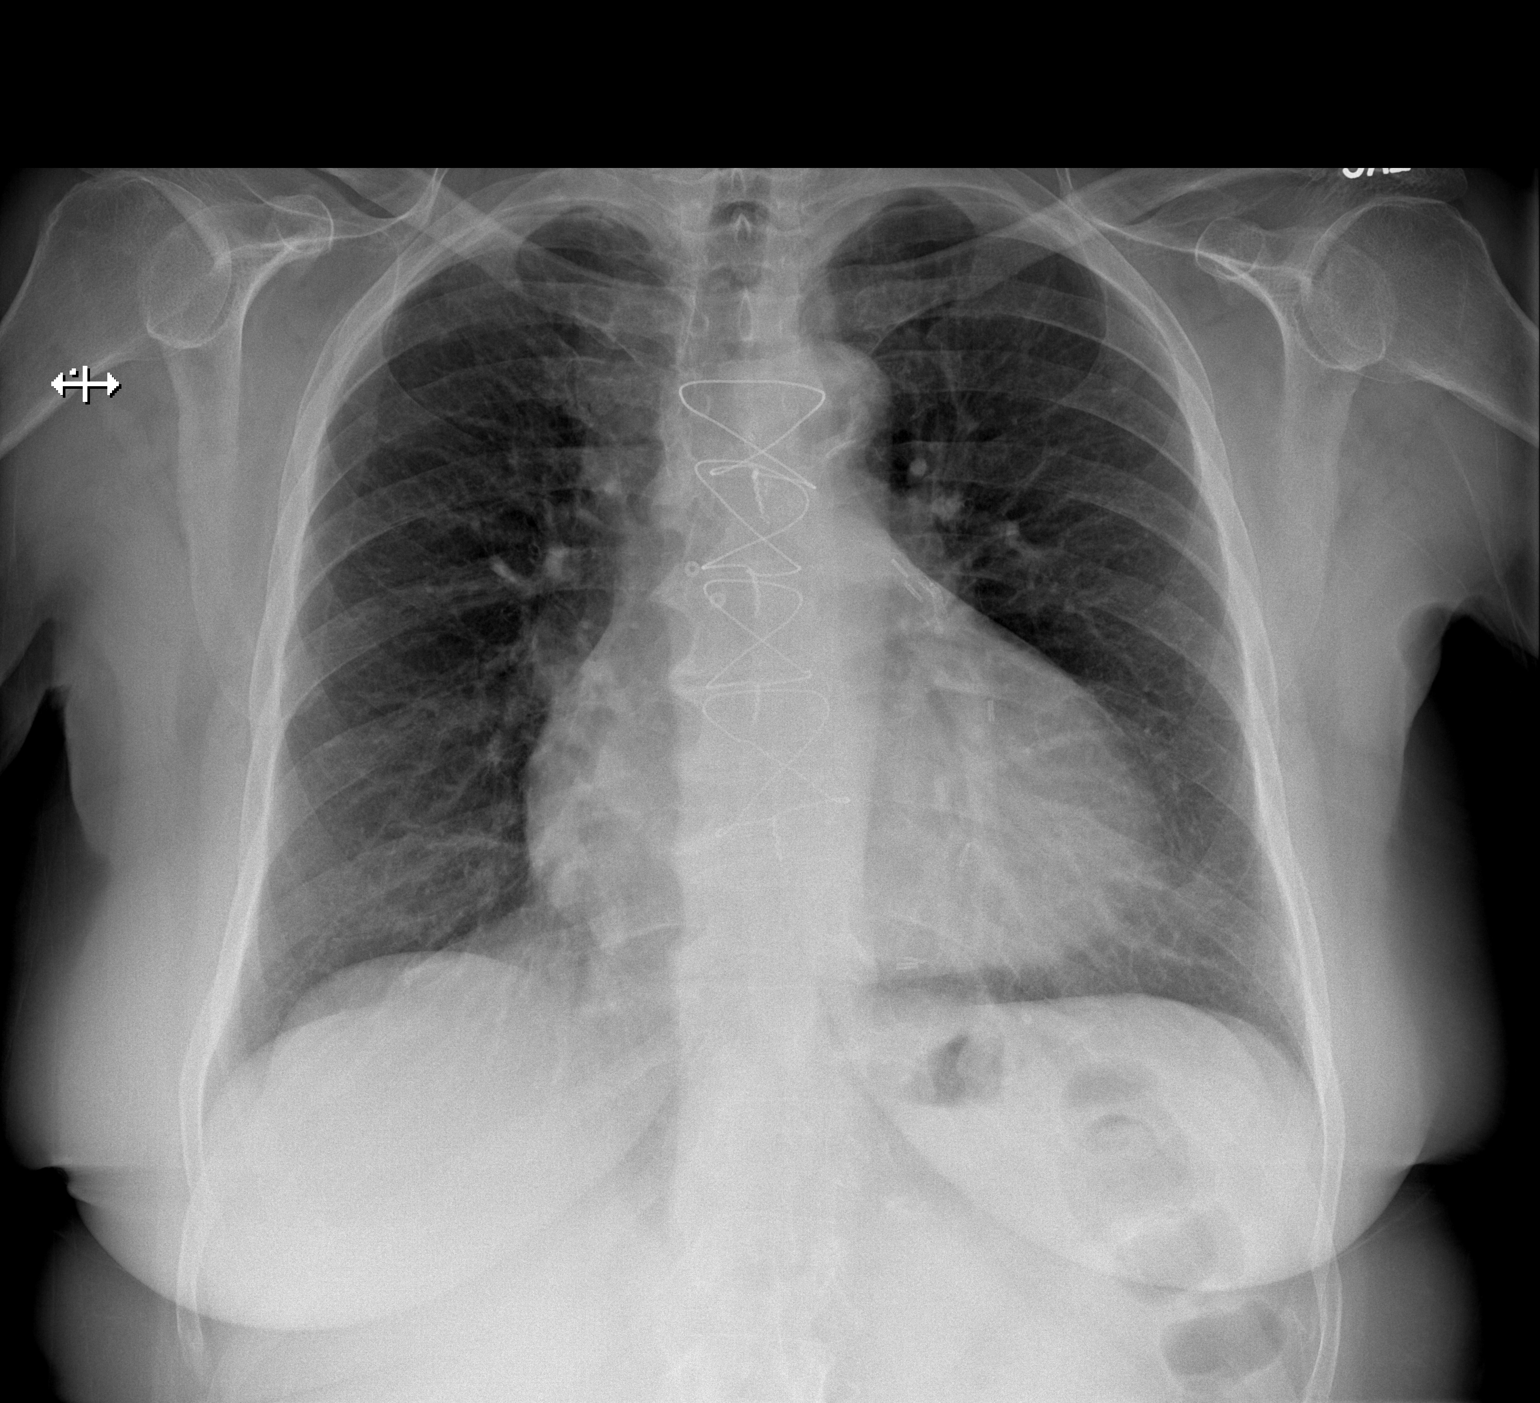
[im 2/2]
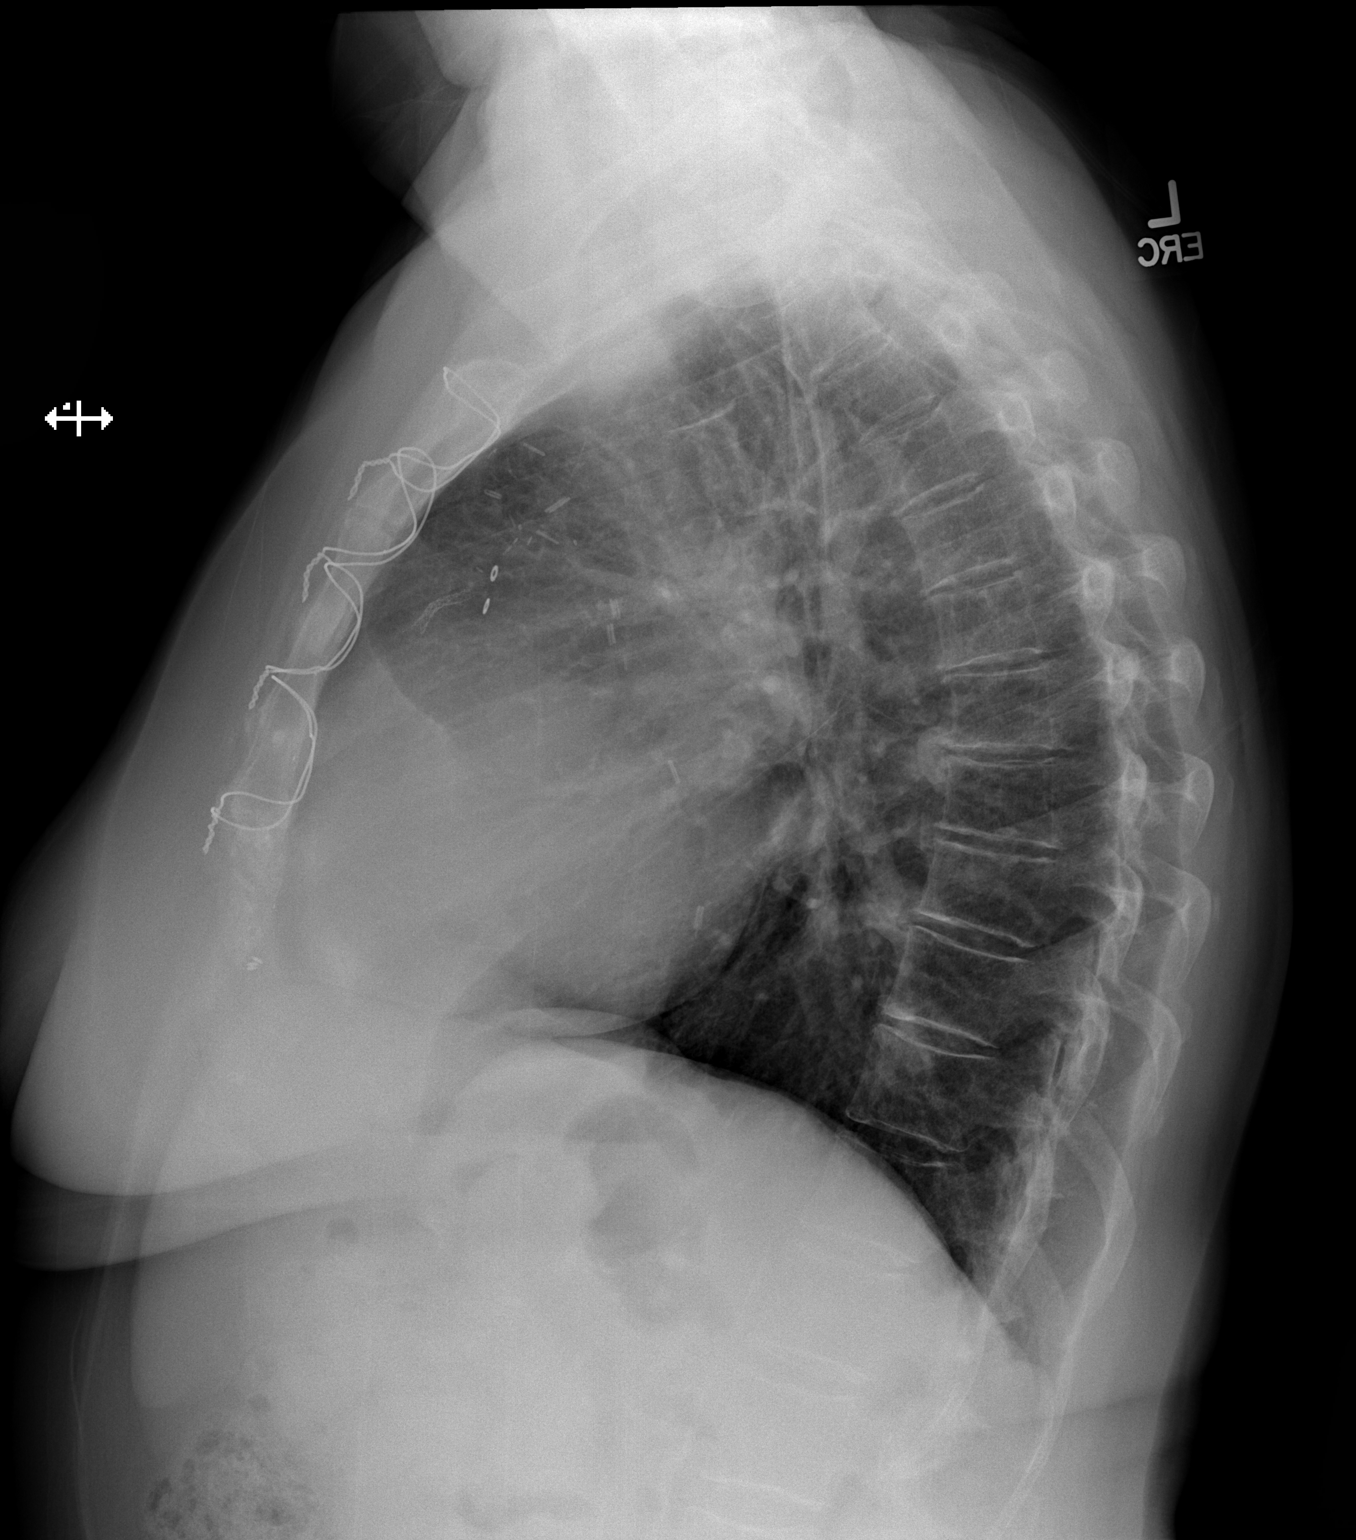

[2 of 2 positions shown; findings below may reference images not displayed]

FINDINGS: Enlargement of cardiac silhouette post CABG.

Atherosclerotic calcification aorta.

Mediastinal contours and pulmonary vascularity normal.

Minimal chronic peribronchial thickening and accentuation of
interstitial markings.

No acute infiltrate, pleural effusion or pneumothorax.

Bones appear demineralized.
IMPRESSION: Enlargement of cardiac silhouette post CABG.

Chronic bronchitic changes and chronic accentuation of interstitial
markings without acute infiltrate.

## 2015-09-25 ENCOUNTER — Emergency Department
Admission: EM | Admit: 2015-09-25 | Discharge: 2015-09-26 | Disposition: A | Discharge status: Home / Self Care | Payer: Medicare Other | Attending: Emergency Medicine | Admitting: Emergency Medicine

## 2015-09-25 ENCOUNTER — Encounter: Payer: Self-pay | Admitting: Emergency Medicine

## 2015-09-25 DIAGNOSIS — Z79899 Other long term (current) drug therapy: Secondary | ICD-10-CM | POA: Insufficient documentation

## 2015-09-25 DIAGNOSIS — Z7984 Long term (current) use of oral hypoglycemic drugs: Secondary | ICD-10-CM | POA: Insufficient documentation

## 2015-09-25 DIAGNOSIS — M7989 Other specified soft tissue disorders: Secondary | ICD-10-CM | POA: Diagnosis present

## 2015-09-25 DIAGNOSIS — L03116 Cellulitis of left lower limb: Secondary | ICD-10-CM

## 2015-09-25 DIAGNOSIS — Z7901 Long term (current) use of anticoagulants: Secondary | ICD-10-CM | POA: Diagnosis not present

## 2015-09-25 DIAGNOSIS — I2510 Atherosclerotic heart disease of native coronary artery without angina pectoris: Secondary | ICD-10-CM | POA: Insufficient documentation

## 2015-09-25 DIAGNOSIS — I110 Hypertensive heart disease with heart failure: Secondary | ICD-10-CM | POA: Insufficient documentation

## 2015-09-25 DIAGNOSIS — I4891 Unspecified atrial fibrillation: Secondary | ICD-10-CM | POA: Insufficient documentation

## 2015-09-25 DIAGNOSIS — E785 Hyperlipidemia, unspecified: Secondary | ICD-10-CM | POA: Diagnosis not present

## 2015-09-25 DIAGNOSIS — E119 Type 2 diabetes mellitus without complications: Secondary | ICD-10-CM | POA: Insufficient documentation

## 2015-09-25 DIAGNOSIS — I509 Heart failure, unspecified: Secondary | ICD-10-CM | POA: Insufficient documentation

## 2015-09-25 DIAGNOSIS — R609 Edema, unspecified: Secondary | ICD-10-CM

## 2015-09-25 LAB — URINALYSIS COMPLETE WITH MICROSCOPIC (ARMC ONLY)
BACTERIA UA: NONE SEEN
Bilirubin Urine: NEGATIVE
Glucose, UA: >500 mg/dL — AB
HGB URINE DIPSTICK: NEGATIVE
KETONES UR: NEGATIVE mg/dL
LEUKOCYTES UA: NEGATIVE
NITRITE: NEGATIVE
PH: 6 (ref 5.0–8.0)
PROTEIN: 100 mg/dL — AB
SPECIFIC GRAVITY, URINE: 1.016 (ref 1.005–1.030)

## 2015-09-25 LAB — COMPREHENSIVE METABOLIC PANEL
ALT: 15 U/L (ref 14–54)
AST: 15 U/L (ref 15–41)
Albumin: 3.8 g/dL (ref 3.5–5.0)
Alkaline Phosphatase: 131 U/L — ABNORMAL HIGH (ref 38–126)
Anion gap: 7 (ref 5–15)
BUN: 22 mg/dL — AB (ref 6–20)
CO2: 27 mmol/L (ref 22–32)
CREATININE: 0.87 mg/dL (ref 0.44–1.00)
Calcium: 8.8 mg/dL — ABNORMAL LOW (ref 8.9–10.3)
Chloride: 107 mmol/L (ref 101–111)
GFR calc Af Amer: >60 mL/min (ref >60)
Glucose, Bld: 211 mg/dL — ABNORMAL HIGH (ref 65–99)
Potassium: 4.2 mmol/L (ref 3.5–5.1)
Sodium: 141 mmol/L (ref 135–145)
TOTAL PROTEIN: 7.2 g/dL (ref 6.5–8.1)

## 2015-09-25 LAB — CBC
HEMATOCRIT: 36.3 % (ref 35.0–47.0)
HEMOGLOBIN: 12 g/dL (ref 12.0–16.0)
MCH: 27.8 pg (ref 26.0–34.0)
MCHC: 33 g/dL (ref 32.0–36.0)
MCV: 84.3 fL (ref 80.0–100.0)
Platelets: 268 10*3/uL (ref 150–440)
RBC: 4.31 MIL/uL (ref 3.80–5.20)
RDW: 14.8 % — ABNORMAL HIGH (ref 11.5–14.5)
WBC: 9.5 10*3/uL (ref 3.6–11.0)

## 2015-09-25 MED ORDER — CEPHALEXIN 500 MG PO CAPS: 500.0000 mg | ORAL_CAPSULE | Freq: Three times a day (TID) | ORAL | Status: DC

## 2015-09-25 MED ORDER — CEPHALEXIN 500 MG PO CAPS
500.0000 mg | ORAL_CAPSULE | Freq: Once | ORAL | Status: AC
  Administered 2015-09-26: 500 mg via ORAL
  Filled 2015-09-25: qty 1

## 2015-09-25 MED ORDER — FUROSEMIDE 20 MG PO TABS: 20.0000 mg | ORAL_TABLET | Freq: Every day | ORAL | Status: DC

## 2015-09-25 NOTE — ED Notes (Addendum)
Pt with left lower extremity redness and swelling that started 2 weeks ago and getting worse. Denies fever States she saw her PCP 2 weeks ago and told that she had an infection but was not given an abx.  Pt also c/o of urinary frequency requesting to check urine.

## 2015-09-25 NOTE — ED Provider Notes (Signed)
[facility] Emergency Department Provider Note   ____________________________________________  Time seen: Approximately 11:33 PM  I have reviewed the triage vital signs and the nursing notes.   HISTORY  Chief Complaint Leg Swelling    HPI Robin [name] is a 75 y.o. female with a history of atrial fibrillation on Xarelto, CAD, CHF not on Lasix who presents with left lower extremity swelling. Patient was hospitalized approximately 2 weeks ago for atrial fibrillation with rapid ventricular rate. States since discharge she has noted swelling to her left lower leg. She was seen at [facility] ED 5 days ago for same; had a negative Doppler per her report. She is concerned because she did not receive antibiotics and she is noting redness in addition to the swelling. Denies associated fever, chills, chest pain, shortness of breath, abdominal pain, nausea, vomiting, diarrhea. She does not know why she is currently not taking Lasix but states she was at one point. Denies recent travel or trauma. Nothing makes her symptoms better or worse.   Past Medical History  Diagnosis Date  . Hypertension   . Diabetes mellitus without complication (HCC)   . CHF (congestive heart failure) (HCC)   . Coronary artery disease   . Afib (HCC)   . Anxiety   . Hyperlipidemia     Patient Active Problem List   Diagnosis Date Noted  . Atrial fibrillation with RVR (HCC) 09/13/2015    History reviewed. No pertinent past surgical history.  Current Outpatient Rx  Name  Route  Sig  Dispense  Refill  . albuterol (ACCUNEB) 1.25 MG/3ML nebulizer solution   Nebulization   Take 1.25 mg by nebulization every 4 (four) hours as needed for wheezing or shortness of breath.          Marland Kitchen albuterol-ipratropium (COMBIVENT) 18-103 MCG/ACT inhaler   Inhalation   Inhale 2 puffs into the lungs 4 (four) times daily as needed for wheezing or shortness of breath.         Marland Kitchen amLODipine (NORVASC) 10 MG  tablet   Oral   Take 1 tablet (10 mg total) by mouth daily.   30 tablet   2   . atorvastatin (LIPITOR) 80 MG tablet   Oral   Take 1 tablet (80 mg total) by mouth daily.   30 tablet   2   . diltiazem (CARDIZEM CD) 180 MG 24 hr capsule   Oral   Take 1 capsule (180 mg total) by mouth daily.   30 capsule   2   . glipiZIDE (GLUCOTROL) 5 MG tablet   Oral   Take 5 mg by mouth daily before breakfast.         . lisinopril (PRINIVIL,ZESTRIL) 20 MG tablet   Oral   Take 1 tablet (20 mg total) by mouth daily.   30 tablet   0   . meclizine (ANTIVERT) 25 MG tablet   Oral   Take 25 mg by mouth 2 (two) times daily as needed for dizziness.         . metFORMIN (GLUCOPHAGE) 500 MG tablet   Oral   Take 1 tablet (500 mg total) by mouth 2 (two) times daily with a meal.         . metoprolol succinate (TOPROL-XL) 25 MG 24 hr tablet   Oral   Take 1 tablet (25 mg total) by mouth daily.   30 tablet   2   . nitroGLYCERIN (NITROSTAT) 0.4 MG SL tablet   Sublingual  Place 0.4 mg under the tongue every 5 (five) minutes as needed for chest pain.          Marland Kitchen oxyCODONE-acetaminophen (PERCOCET/ROXICET) 5-325 MG tablet   Oral   Take 1 tablet by mouth every 8 (eight) hours as needed for moderate pain or severe pain.         . rivaroxaban (XARELTO) 20 MG TABS tablet   Oral   Take 1 tablet (20 mg total) by mouth daily.   30 tablet   2   . cephALEXin (KEFLEX) 500 MG capsule   Oral   Take 1 capsule (500 mg total) by mouth 3 (three) times daily.   21 capsule   0   . furosemide (LASIX) 20 MG tablet   Oral   Take 1 tablet (20 mg total) by mouth daily.   3 tablet   0     Allergies Review of patient's allergies indicates no known allergies.  Family History  Problem Relation Age of Onset  . Hypertension Mother     Social History Social History  Substance Use Topics  . Smoking status: Never Smoker   . Smokeless tobacco: None  . Alcohol Use: No    Review of  Systems  Constitutional: No fever/chills. Eyes: No visual changes. ENT: No sore throat. Cardiovascular: Denies chest pain. Respiratory: Denies shortness of breath. Gastrointestinal: No abdominal pain.  No nausea, no vomiting.  No diarrhea.  No constipation. Genitourinary: Negative for dysuria. Musculoskeletal: Positive for left lower extremity swelling and redness. Negative for back pain. Skin: Negative for rash. Neurological: Negative for headaches, focal weakness or numbness.  10-point ROS otherwise negative.  ____________________________________________   PHYSICAL EXAM:  VITAL SIGNS: ED Triage Vitals  Enc Vitals Group     BP 09/25/15 1832 191/55 mmHg     Pulse Rate 09/25/15 1832 67     Resp 09/25/15 1832 20     Temp 09/25/15 1832 98.1 F (36.7 C)     Temp Source 09/25/15 1832 Oral     SpO2 09/25/15 1832 98 %     Weight 09/25/15 1832 154 lb (69.854 kg)     Height 09/25/15 1832 5\' 4"  (1.626 m)     Head Cir --      Peak Flow --      Pain Score 09/25/15 1835 8     Pain Loc --      Pain Edu? --      Excl. in GC? --     Constitutional: Alert and oriented. Well appearing and in no acute distress. Eyes: Conjunctivae are normal. PERRL. EOMI. Head: Atraumatic. Nose: No congestion/rhinnorhea. Mouth/Throat: Mucous membranes are moist.  Oropharynx non-erythematous. Neck: No stridor.   Cardiovascular: Normal rate, regular rhythm. Grossly normal heart sounds.  Good peripheral circulation. Respiratory: Normal respiratory effort.  No retractions. Lungs CTAB. Gastrointestinal: Soft and nontender. No distention. No abdominal bruits. No CVA tenderness. Musculoskeletal: Left lower extremity with 1+ pitting edema from ankle to knee. Distal anterior shin with some erythema, no warmth. No calf tenderness to palpation. Supple calf without evidence for compartment syndrome. 2+ femoral and distal pulses. Brisk, less than 5 second capillary refill. Symmetrically warm limbs without evidence  for ischemia.  No joint effusions. Neurologic:  Normal speech and language. No gross focal neurologic deficits are appreciated. No gait instability. Skin:  Skin is warm, dry and intact. No rash noted. Psychiatric: Mood and affect are normal. Speech and behavior are normal.  ____________________________________________   LABS (all labs ordered are listed,  but only abnormal results are displayed)  Labs Reviewed  CBC - Abnormal; Notable for the following:    RDW 14.8 (*)    All other components within normal limits  COMPREHENSIVE METABOLIC PANEL - Abnormal; Notable for the following:    Glucose, Bld 211 (*)    BUN 22 (*)    Calcium 8.8 (*)    Alkaline Phosphatase 131 (*)    Total Bilirubin <0.1 (*)    All other components within normal limits  URINALYSIS COMPLETEWITH MICROSCOPIC ([facility] ONLY) - Abnormal; Notable for the following:    Color, Urine YELLOW (*)    APPearance CLEAR (*)    Glucose, UA >500 (*)    Protein, ur 100 (*)    Squamous Epithelial / LPF 0-5 (*)    All other components within normal limits   ____________________________________________  EKG  ED ECG REPORT I, Clytie Shetley J, the attending physician, personally viewed and interpreted this ECG.   Date: 09/25/2015  EKG Time: 2352  Rate: 67  Rhythm: normal EKG, normal sinus rhythm  Axis: Normal  Intervals:none  ST&T Change: Nonspecific  ____________________________________________  RADIOLOGY  None ____________________________________________   PROCEDURES  Procedure(s) performed: None  Procedures  Critical Care performed: No  ____________________________________________   INITIAL IMPRESSION / ASSESSMENT AND PLAN / ED COURSE  Pertinent labs & imaging results that were available during my care of the patient were reviewed by me and considered in my medical decision making (see chart for details).  75 year old female with a history of atrial fibrillation on Xarelto, history of CHF not currently  taking Lasix who presents for reevaluation of left lower leg swelling. I recommended obtaining another Doppler ultrasound to evaluate for DVT but patient had a long ED wait and states she needs to leave now secondary to not having a ride if she stays for ultrasound. Will place patient on empiric Keflex, 3 day dose of Lasix, recommended compression stockings and elevation. Patient will have close follow-up with her PCP this coming week. Strict return precautions given. Patient and her friend verbalized understanding and agree with plan of care. ____________________________________________   FINAL CLINICAL IMPRESSION(S) / ED DIAGNOSES  Final diagnoses:  Peripheral edema  Cellulitis of left lower extremity      NEW MEDICATIONS STARTED DURING THIS VISIT:  Discharge Medication List as of 09/25/2015 11:50 PM    START taking these medications   Details  cephALEXin (KEFLEX) 500 MG capsule Take 1 capsule (500 mg total) by mouth 3 (three) times daily., Starting 09/25/2015, Until Discontinued, Print    furosemide (LASIX) 20 MG tablet Take 1 tablet (20 mg total) by mouth daily., Starting 09/25/2015, Until Discontinued, Print         Note:  This document was prepared using Dragon voice recognition software and may include unintentional dictation errors.    Irean Hong, MD 09/26/15 [phone]

## 2015-09-25 NOTE — Discharge Instructions (Signed)
1. Take antibiotic as prescribed (Keflex 500 mg 3 times daily 7 days). 2. Take Lasix 20 mg daily for the next 3 days. 3. Wear compression stockings to help reduce your swelling. 4. Elevate affected area several times daily. 5. Return to the ER for worsening symptoms, persistent vomiting, difficulty breathing or other concerns.  Peripheral Edema You have swelling in your legs (peripheral edema). This swelling is due to excess accumulation of salt and water in your body. Edema may be a sign of heart, kidney or liver disease, or a side effect of a medication. It may also be due to problems in the leg veins. Elevating your legs and using special support stockings may be very helpful, if the cause of the swelling is due to poor venous circulation. Avoid long periods of standing, whatever the cause. Treatment of edema depends on identifying the cause. Chips, pretzels, pickles and other salty foods should be avoided. Restricting salt in your diet is almost always needed. Water pills (diuretics) are often used to remove the excess salt and water from your body via urine. These medicines prevent the kidney from reabsorbing sodium. This increases urine flow. Diuretic treatment may also result in lowering of potassium levels in your body. Potassium supplements may be needed if you have to use diuretics daily. Daily weights can help you keep track of your progress in clearing your edema. You should call your caregiver for follow up care as recommended. SEEK IMMEDIATE MEDICAL CARE IF:   You have increased swelling, pain, redness, or heat in your legs.  You develop shortness of breath, especially when lying down.  You develop chest or abdominal pain, weakness, or fainting.  You have a fever.   This information is not intended to replace advice given to you by your health care provider. Make sure you discuss any questions you have with your health care provider.   Document Released: 04/05/2004 Document  Revised: 05/21/2011 Document Reviewed: 09/08/2014 Elsevier Interactive Patient Education 2016 [employer]  Cellulitis Cellulitis is an infection of the skin and the tissue beneath it. The infected area is usually red and tender. Cellulitis occurs most often in the arms and lower legs.  CAUSES  Cellulitis is caused by bacteria that enter the skin through cracks or cuts in the skin. The most common types of bacteria that cause cellulitis are staphylococci and streptococci. SIGNS AND SYMPTOMS   Redness and warmth.  Swelling.  Tenderness or pain.  Fever. DIAGNOSIS  Your health care provider can usually determine what is wrong based on a physical exam. Blood tests may also be done. TREATMENT  Treatment usually involves taking an antibiotic medicine. HOME CARE INSTRUCTIONS   Take your antibiotic medicine as directed by your health care provider. Finish the antibiotic even if you start to feel better.  Keep the infected arm or leg elevated to reduce swelling.  Apply a warm cloth to the affected area up to 4 times per day to relieve pain.  Take medicines only as directed by your health care provider.  Keep all follow-up visits as directed by your health care provider. SEEK MEDICAL CARE IF:   You notice red streaks coming from the infected area.  Your red area gets larger or turns dark in color.  Your bone or joint underneath the infected area becomes painful after the skin has healed.  Your infection returns in the same area or another area.  You notice a swollen bump in the infected area.  You develop new symptoms.  You have a fever. SEEK IMMEDIATE MEDICAL CARE IF:   You feel very sleepy.  You develop vomiting or diarrhea.  You have a general ill feeling (malaise) with muscle aches and pains.   This information is not intended to replace advice given to you by your health care provider. Make sure you discuss any questions you have with your health care provider.     Document Released: 12/06/2004 Document Revised: 11/17/2014 Document Reviewed: 05/14/2011 Elsevier Interactive Patient Education [employer]

## 2015-09-26 DIAGNOSIS — L03116 Cellulitis of left lower limb: Secondary | ICD-10-CM | POA: Diagnosis not present

## 2015-09-28 IMAGING — US US EXTREM LOW VENOUS*L*
1 series · 13 of 24 positions shown · non-contrast
Comparison: None.

REASON FOR EXAM: Swelling
COMMENTS:

PROCEDURE:     US  - US DOPPLER LOW EXTR LEFT  - July 16, 2013 [DATE]
CLINICAL DATA: Swelling, left ankle pain
EXAM:
LEFT LOWER EXTREMITY VENOUS DOPPLER ULTRASOUND
TECHNIQUE: Gray-scale sonography with graded compression, as well as color
Doppler and duplex ultrasound were performed to evaluate the lower
extremity deep venous systems from the level of the common femoral
vein and including the common femoral, femoral, profunda femoral,
popliteal and calf veins including the posterior tibial, peroneal
and gastrocnemius veins when visible. The superficial great
saphenous vein was also interrogated. Spectral Doppler was utilized
to evaluate flow at rest and with distal augmentation maneuvers in
the common femoral, femoral and popliteal veins.

[Series 1: us extrem low venous*left* · 0.07mm/px · 13 of 41 slices shown]
[im 1/41]
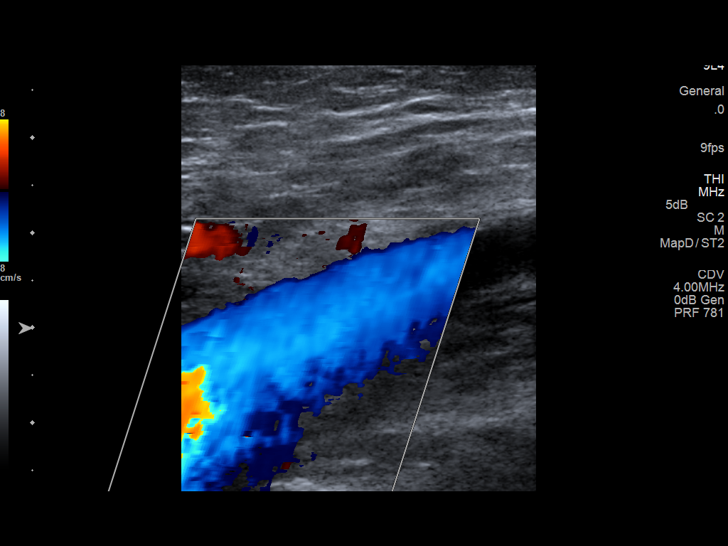
[im 4/41]
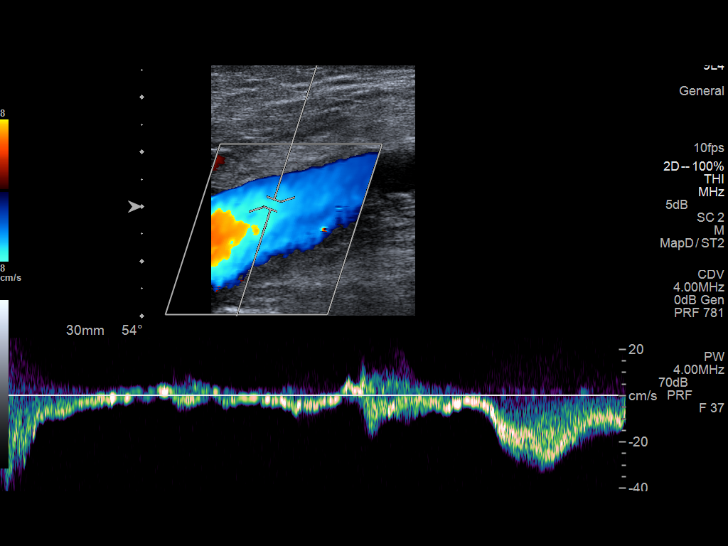
[im 7/41]
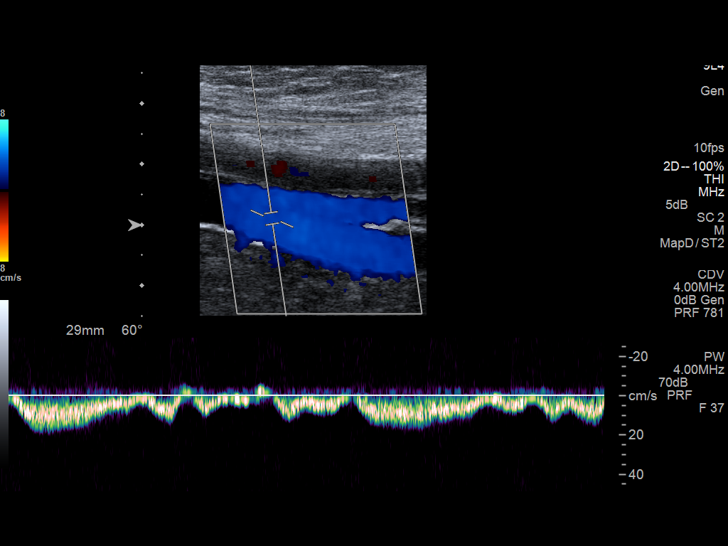
[im 11/41]
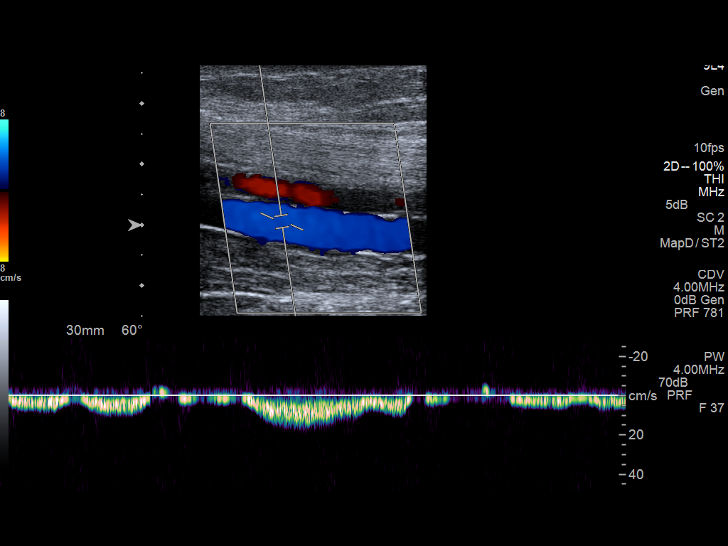
[im 14/41]
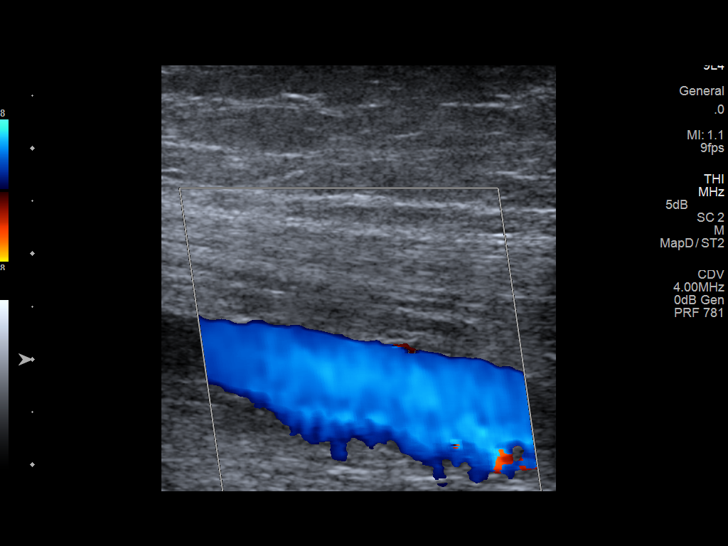
[im 18/41]
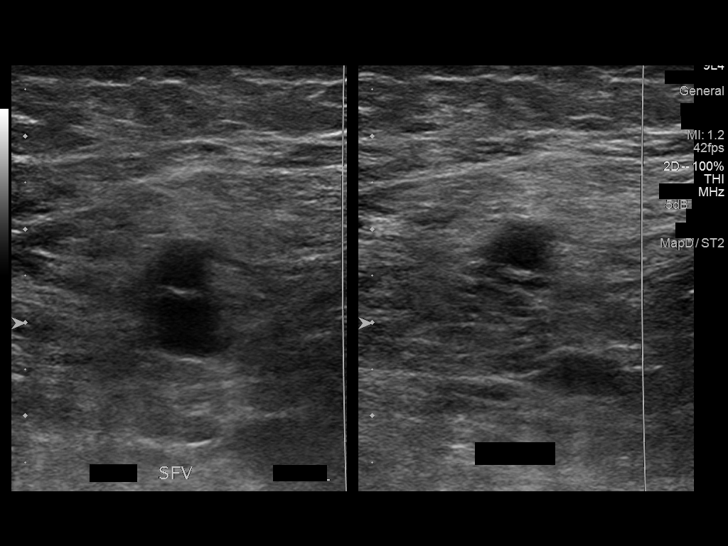
[im 21/41]
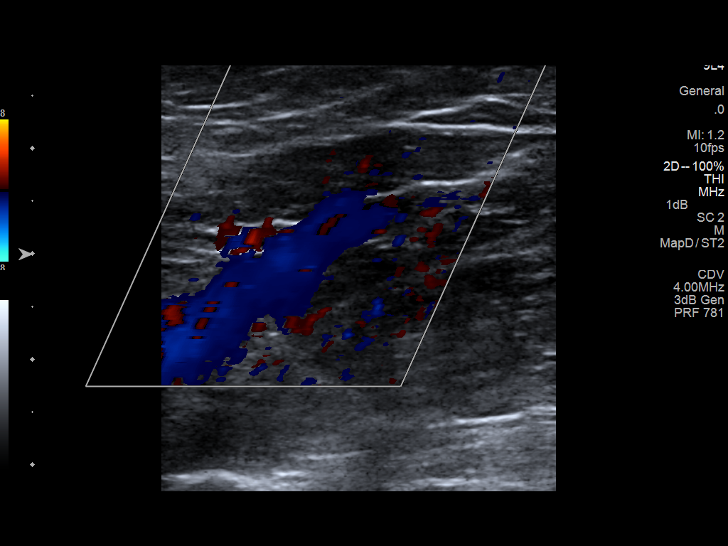
[im 23/41]
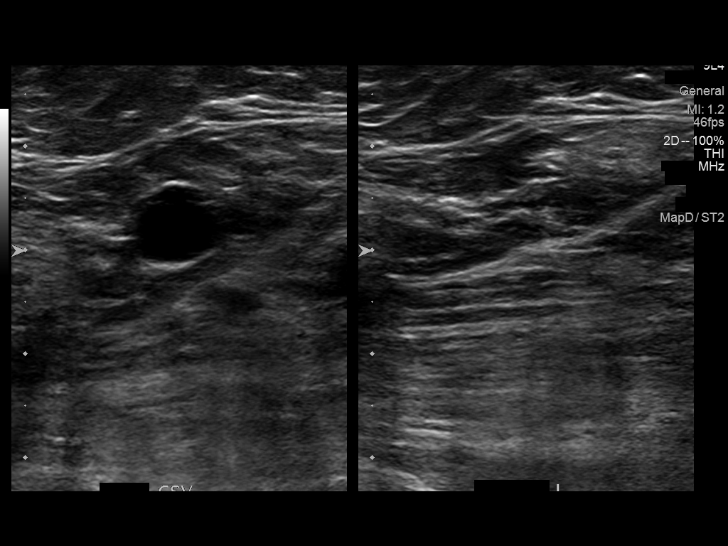
[im 27/41]
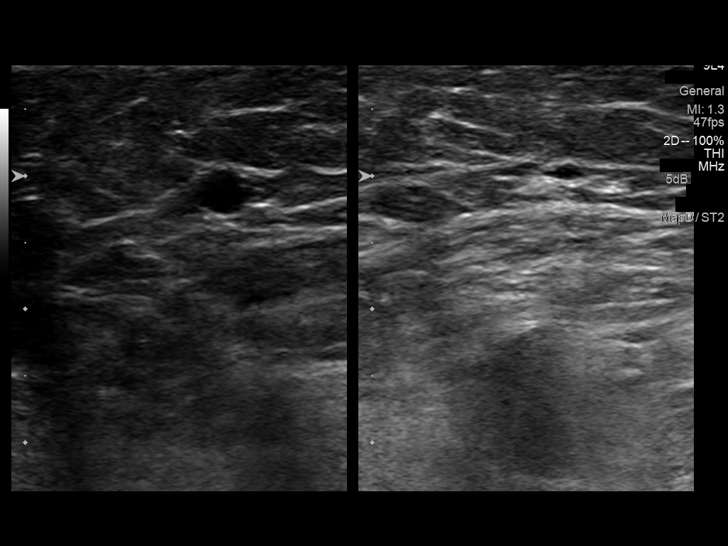
[im 30/41]
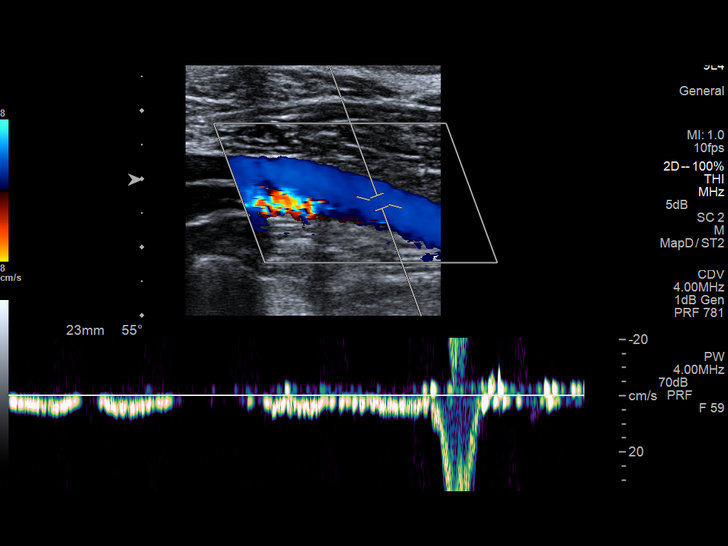
[im 34/41]
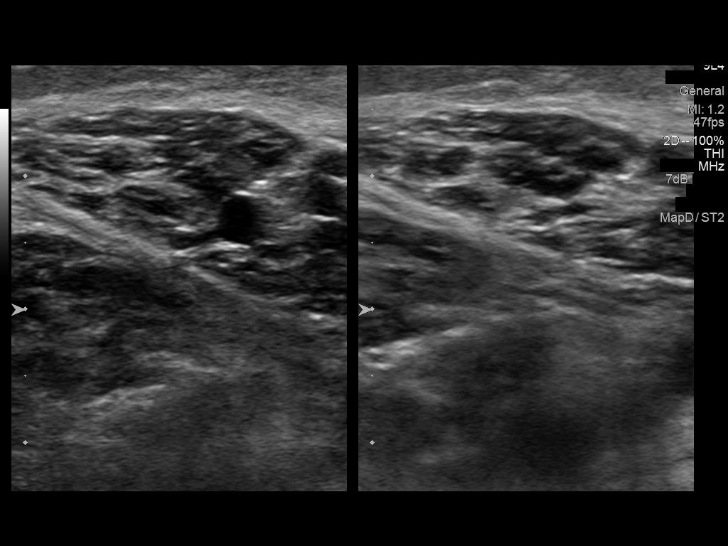
[im 37/41]
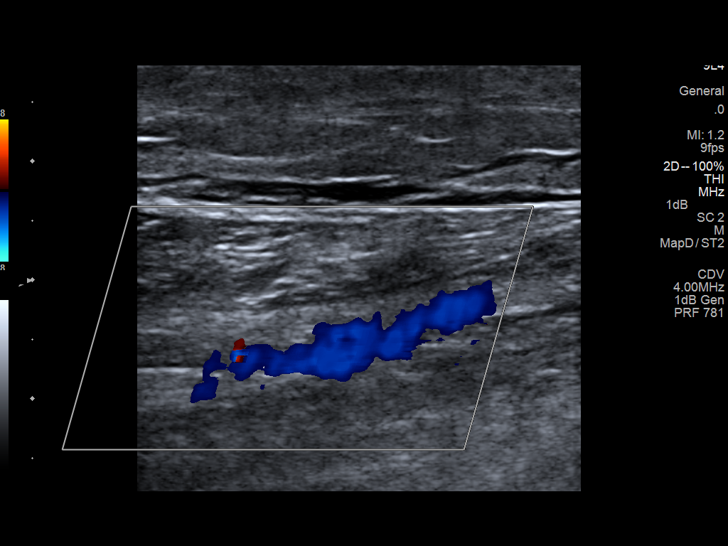
[im 41/41]
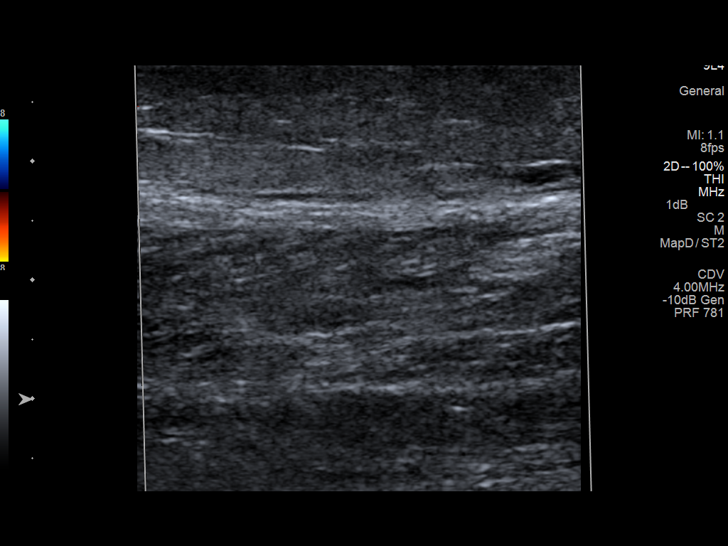

[13 of 24 positions shown; findings below may reference images not displayed]

FINDINGS: Common Femoral Vein: No evidence of thrombus. Normal
compressibility, respiratory phasicity and response to augmentation.

Saphenofemoral Junction: No evidence of thrombus. Normal
compressibility and flow on color Doppler imaging.

Profunda Femoral Vein: No evidence of thrombus. Normal
compressibility and flow on color Doppler imaging.

Femoral Vein: No evidence of thrombus. Normal compressibility,
respiratory phasicity and response to augmentation.

Popliteal Vein: No evidence of thrombus. Normal compressibility,
respiratory phasicity and response to augmentation.

Calf Veins: No evidence of thrombus. Normal compressibility and flow
on color Doppler imaging.

Superficial Great Saphenous Vein: No evidence of thrombus. Normal
compressibility and flow on color Doppler imaging.

Venous Reflux:  None.

Other Findings:  None.
IMPRESSION: No evidence of deep venous thrombosis.

## 2015-10-13 IMAGING — CR DG CHEST 1V PORT
1 series · 1 of 1 positions shown · non-contrast
Comparison: 07/05/2013.

CLINICAL DATA: Hypertension and shortness of breath. Distal lower
extremity edema. History CABG.

EXAM:
PORTABLE CHEST - 1 VIEW

[ap]
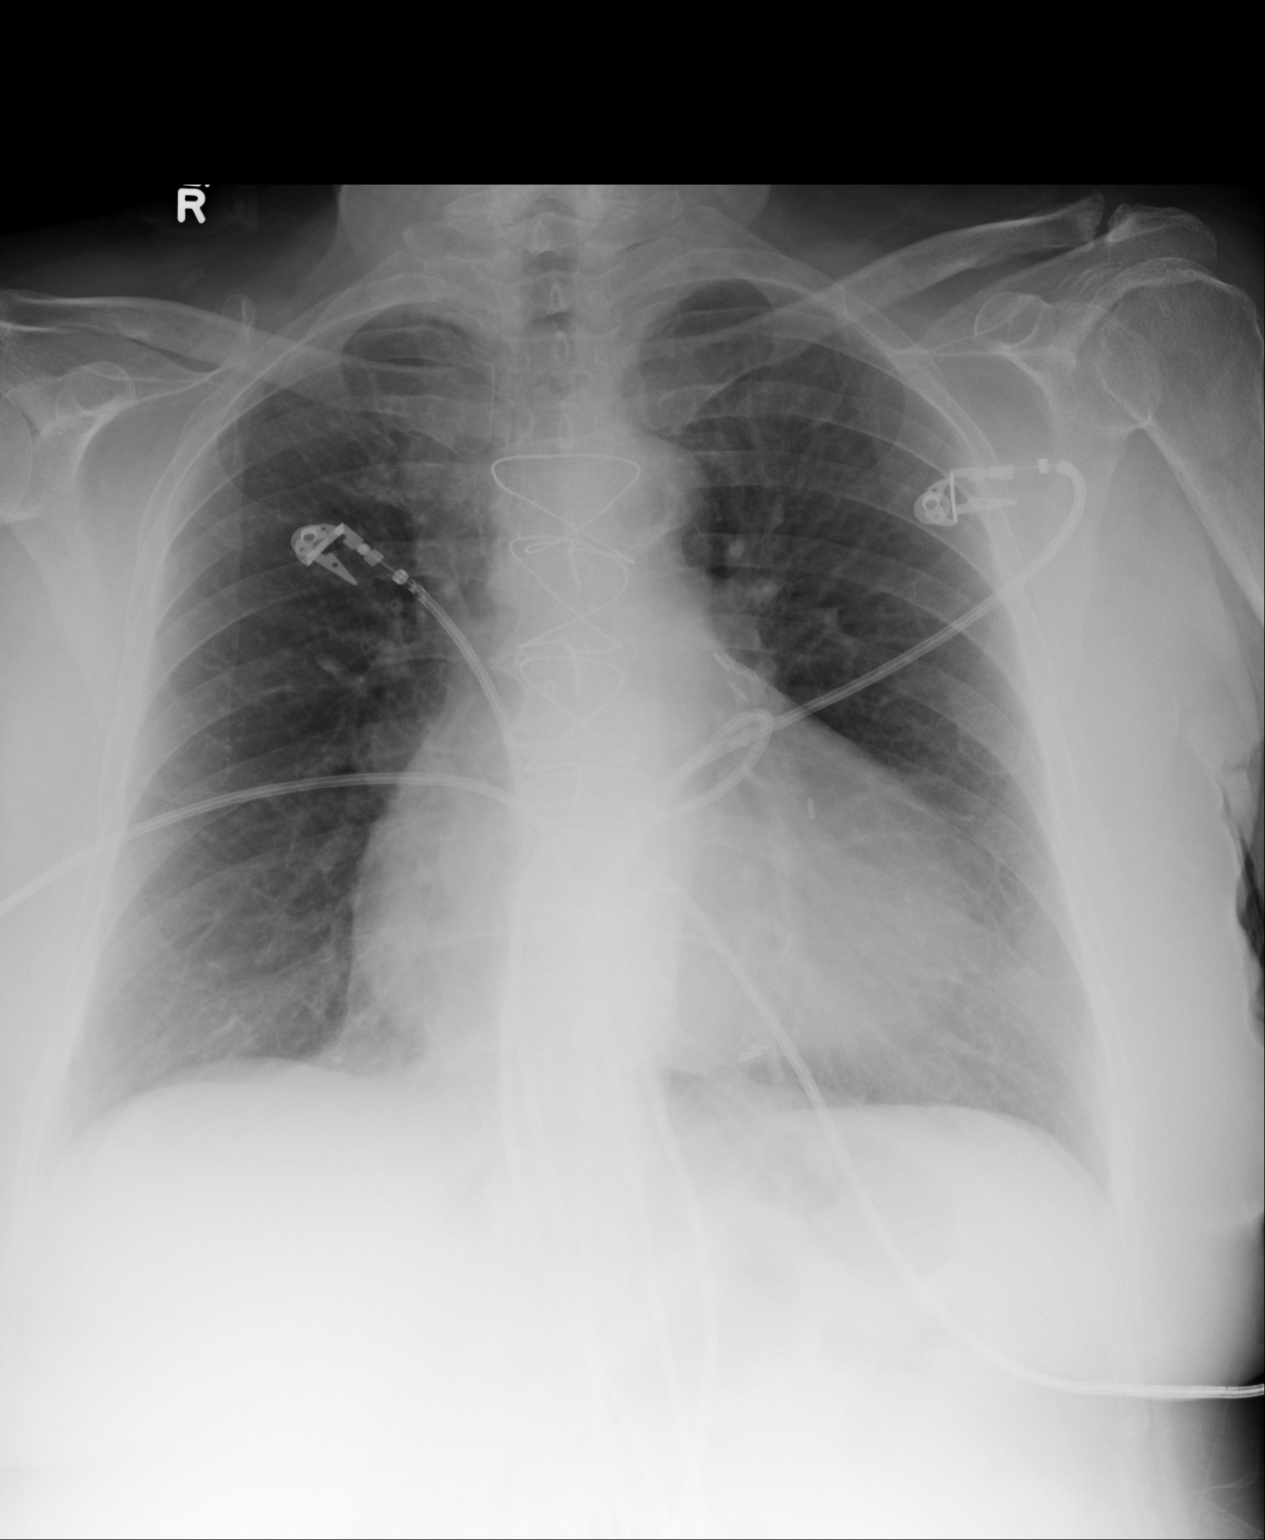

[1 of 1 positions shown; findings below may reference images not displayed]

FINDINGS: Stable moderate enlargement of a cardiopericardial silhouette.
Changes of median sternotomy for CABG. Cardiac leads project over
the chest. Pulmonary vascularity is slightly engorged. The lungs are
well expanded and clear. No visible pleural effusion. The bones are
unremarkable.
IMPRESSION: Stable cardiomegaly.  Mild pulmonary vascular congestion.

## 2015-11-04 ENCOUNTER — Encounter: Payer: Self-pay | Admitting: Internal Medicine

## 2015-11-04 ENCOUNTER — Emergency Department: Payer: Medicare Other

## 2015-11-04 ENCOUNTER — Observation Stay
Admission: EM | Admit: 2015-11-04 | Discharge: 2015-11-06 | Disposition: A | Discharge status: Home / Self Care | Payer: Medicare Other | Attending: Internal Medicine | Admitting: Internal Medicine

## 2015-11-04 DIAGNOSIS — I2510 Atherosclerotic heart disease of native coronary artery without angina pectoris: Secondary | ICD-10-CM | POA: Diagnosis not present

## 2015-11-04 DIAGNOSIS — I5022 Chronic systolic (congestive) heart failure: Secondary | ICD-10-CM | POA: Diagnosis not present

## 2015-11-04 DIAGNOSIS — K219 Gastro-esophageal reflux disease without esophagitis: Secondary | ICD-10-CM | POA: Insufficient documentation

## 2015-11-04 DIAGNOSIS — I4891 Unspecified atrial fibrillation: Secondary | ICD-10-CM | POA: Diagnosis present

## 2015-11-04 DIAGNOSIS — Z7901 Long term (current) use of anticoagulants: Secondary | ICD-10-CM | POA: Insufficient documentation

## 2015-11-04 DIAGNOSIS — R300 Dysuria: Secondary | ICD-10-CM | POA: Insufficient documentation

## 2015-11-04 DIAGNOSIS — I480 Paroxysmal atrial fibrillation: Secondary | ICD-10-CM | POA: Insufficient documentation

## 2015-11-04 DIAGNOSIS — Z951 Presence of aortocoronary bypass graft: Secondary | ICD-10-CM | POA: Diagnosis not present

## 2015-11-04 DIAGNOSIS — I110 Hypertensive heart disease with heart failure: Secondary | ICD-10-CM | POA: Diagnosis not present

## 2015-11-04 DIAGNOSIS — E785 Hyperlipidemia, unspecified: Secondary | ICD-10-CM | POA: Insufficient documentation

## 2015-11-04 DIAGNOSIS — Z7984 Long term (current) use of oral hypoglycemic drugs: Secondary | ICD-10-CM | POA: Insufficient documentation

## 2015-11-04 DIAGNOSIS — F419 Anxiety disorder, unspecified: Secondary | ICD-10-CM | POA: Insufficient documentation

## 2015-11-04 DIAGNOSIS — I10 Essential (primary) hypertension: Secondary | ICD-10-CM | POA: Diagnosis present

## 2015-11-04 DIAGNOSIS — R079 Chest pain, unspecified: Secondary | ICD-10-CM | POA: Diagnosis present

## 2015-11-04 DIAGNOSIS — I5023 Acute on chronic systolic (congestive) heart failure: Secondary | ICD-10-CM | POA: Diagnosis present

## 2015-11-04 DIAGNOSIS — I481 Persistent atrial fibrillation: Secondary | ICD-10-CM | POA: Diagnosis not present

## 2015-11-04 DIAGNOSIS — E119 Type 2 diabetes mellitus without complications: Secondary | ICD-10-CM

## 2015-11-04 DIAGNOSIS — R600 Localized edema: Secondary | ICD-10-CM | POA: Diagnosis not present

## 2015-11-04 DIAGNOSIS — E876 Hypokalemia: Secondary | ICD-10-CM | POA: Insufficient documentation

## 2015-11-04 LAB — CBC WITH DIFFERENTIAL/PLATELET
BASOS ABS: 0.1 10*3/uL (ref 0–0.1)
BASOS PCT: 1 %
EOS ABS: 0 10*3/uL (ref 0–0.7)
Eosinophils Relative: 0 %
HCT: 37.1 % (ref 35.0–47.0)
HEMOGLOBIN: 12.4 g/dL (ref 12.0–16.0)
Lymphocytes Relative: 25 %
Lymphs Abs: 2.9 10*3/uL (ref 1.0–3.6)
MCH: 27.8 pg (ref 26.0–34.0)
MCHC: 33.4 g/dL (ref 32.0–36.0)
MCV: 83.2 fL (ref 80.0–100.0)
MONO ABS: 0.8 10*3/uL (ref 0.2–0.9)
MONOS PCT: 7 %
NEUTROS PCT: 67 %
Neutro Abs: 7.6 10*3/uL — ABNORMAL HIGH (ref 1.4–6.5)
Platelets: 234 10*3/uL (ref 150–440)
RBC: 4.46 MIL/uL (ref 3.80–5.20)
RDW: 14.7 % — AB (ref 11.5–14.5)
WBC: 11.4 10*3/uL — ABNORMAL HIGH (ref 3.6–11.0)

## 2015-11-04 LAB — PROTIME-INR
INR: 1
PROTHROMBIN TIME: 13.2 s (ref 11.4–15.2)

## 2015-11-04 LAB — HEPATIC FUNCTION PANEL
ALT: 23 U/L (ref 14–54)
AST: 19 U/L (ref 15–41)
Albumin: 3.7 g/dL (ref 3.5–5.0)
Alkaline Phosphatase: 109 U/L (ref 38–126)
TOTAL PROTEIN: 6.9 g/dL (ref 6.5–8.1)
Total Bilirubin: 0.4 mg/dL (ref 0.3–1.2)

## 2015-11-04 LAB — BASIC METABOLIC PANEL
ANION GAP: 9 (ref 5–15)
BUN: 21 mg/dL — ABNORMAL HIGH (ref 6–20)
CALCIUM: 8.9 mg/dL (ref 8.9–10.3)
CO2: 25 mmol/L (ref 22–32)
Chloride: 107 mmol/L (ref 101–111)
Creatinine, Ser: 0.38 mg/dL — ABNORMAL LOW (ref 0.44–1.00)
Glucose, Bld: 189 mg/dL — ABNORMAL HIGH (ref 65–99)
Potassium: 3.8 mmol/L (ref 3.5–5.1)
Sodium: 141 mmol/L (ref 135–145)

## 2015-11-04 LAB — LIPASE, BLOOD: LIPASE: 19 U/L (ref 11–51)

## 2015-11-04 LAB — TROPONIN I

## 2015-11-04 MED ORDER — DILTIAZEM HCL 25 MG/5ML IV SOLN
10.0000 mg | Freq: Once | INTRAVENOUS | Status: AC
  Administered 2015-11-04: 10 mg via INTRAVENOUS
  Filled 2015-11-04: qty 5

## 2015-11-04 MED ORDER — SODIUM CHLORIDE 0.9 % IV BOLUS (SEPSIS)
500.0000 mL | Freq: Once | INTRAVENOUS | Status: AC
  Administered 2015-11-04: 500 mL via INTRAVENOUS

## 2015-11-04 NOTE — ED Notes (Signed)
MD at bedside. 

## 2015-11-04 NOTE — ED Notes (Signed)
Patient transported to X-ray 

## 2015-11-04 NOTE — ED Triage Notes (Signed)
Pt reports around 2 pm she was awakened from sleep with tight, burning pain to her mid sternal chest . Pt reports +nausea, +shortness of breath with the pain but denies diaphoresis. Pt talking in full and complete sentences with no diffiuclty at this time.

## 2015-11-04 NOTE — H&P (Signed)
[facility] [place] [facility]   PATIENT NAME: Robin [name]    MR#:  [id number]  DATE OF BIRTH:  07/14/1940  DATE OF ADMISSION:  11/04/2015  PRIMARY CARE PHYSICIAN: Leanna SatoMILES,LINDA M, MD   REQUESTING/REFERRING PHYSICIAN: Inocencio HomesGayle, MD  CHIEF COMPLAINT:   Chief Complaint  Patient presents with  . Chest Pain    HISTORY OF PRESENT ILLNESS:  Robin [name]  is a 75 y.o. female who presents with Chest discomfort and palpitations. Patient has a known history of A. fib, and has had RVR in the past. She came in tonight and was found to be in A. fib with RVR again. She states that her chest discomfort started this afternoon, and was related to her palpitations, with some radiation up into her right neck and jaw. She does have a history of some CAD as well. Initial workup in the ED was negative except for the A. fib with RVR. She was given IV diltiazem and responded fairly well to it, though with incomplete right control. Hospitals were called for admission  PAST MEDICAL HISTORY:   Past Medical History:  Diagnosis Date  . Afib (HCC)   . Anxiety   . CHF (congestive heart failure) (HCC)   . Coronary artery disease   . Diabetes mellitus without complication (HCC)   . Hyperlipidemia   . Hypertension     PAST SURGICAL HISTORY:   Past Surgical History:  Procedure Laterality Date  . NO PAST SURGERIES      SOCIAL HISTORY:   Social History  Substance Use Topics  . Smoking status: Never Smoker  . Smokeless tobacco: Not on file  . Alcohol use No    FAMILY HISTORY:   Family History  Problem Relation Age of Onset  . Hypertension Mother     DRUG ALLERGIES:  No Known Allergies  MEDICATIONS AT HOME:   Prior to Admission medications   Medication Sig Start Date End Date Taking? Authorizing Provider  albuterol (ACCUNEB) 1.25 MG/3ML nebulizer solution Take 1.25 mg by nebulization every 4 (four) hours as needed for wheezing or shortness of breath.      Historical Provider, MD  albuterol-ipratropium (COMBIVENT) 18-103 MCG/ACT inhaler Inhale 2 puffs into the lungs 4 (four) times daily as needed for wheezing or shortness of breath.    Historical Provider, MD  amLODipine (NORVASC) 10 MG tablet Take 1 tablet (10 mg total) by mouth daily. 09/15/15   Auburn BilberryShreyang Patel, MD  atorvastatin (LIPITOR) 80 MG tablet Take 1 tablet (80 mg total) by mouth daily. 09/15/15   Auburn BilberryShreyang Patel, MD  cephALEXin (KEFLEX) 500 MG capsule Take 1 capsule (500 mg total) by mouth 3 (three) times daily. 09/25/15   Irean HongJade J Sung, MD  diltiazem (CARDIZEM CD) 180 MG 24 hr capsule Take 1 capsule (180 mg total) by mouth daily. 09/15/15   Auburn BilberryShreyang Patel, MD  furosemide (LASIX) 20 MG tablet Take 1 tablet (20 mg total) by mouth daily. 09/25/15   Irean HongJade J Sung, MD  glipiZIDE (GLUCOTROL) 5 MG tablet Take 5 mg by mouth daily before breakfast.    Historical Provider, MD  lisinopril (PRINIVIL,ZESTRIL) 20 MG tablet Take 1 tablet (20 mg total) by mouth daily. 09/15/15   Auburn BilberryShreyang Patel, MD  meclizine (ANTIVERT) 25 MG tablet Take 25 mg by mouth 2 (two) times daily as needed for dizziness.    Historical Provider, MD  metFORMIN (GLUCOPHAGE) 500 MG tablet Take 1 tablet (500 mg total) by mouth 2 (two) times daily with a meal.  09/15/15   Auburn Bilberry, MD  metoprolol succinate (TOPROL-XL) 25 MG 24 hr tablet Take 1 tablet (25 mg total) by mouth daily. 09/15/15   Auburn Bilberry, MD  nitroGLYCERIN (NITROSTAT) 0.4 MG SL tablet Place 0.4 mg under the tongue every 5 (five) minutes as needed for chest pain.     Historical Provider, MD  oxyCODONE-acetaminophen (PERCOCET/ROXICET) 5-325 MG tablet Take 1 tablet by mouth every 8 (eight) hours as needed for moderate pain or severe pain.    Historical Provider, MD  rivaroxaban (XARELTO) 20 MG TABS tablet Take 1 tablet (20 mg total) by mouth daily. 09/15/15   Auburn Bilberry, MD    REVIEW OF SYSTEMS:  Review of Systems  Constitutional: Negative for chills, fever, malaise/fatigue and  weight loss.  HENT: Negative for ear pain, hearing loss and tinnitus.   Eyes: Negative for blurred vision, double vision, pain and redness.  Respiratory: Negative for cough, hemoptysis and shortness of breath.   Cardiovascular: Positive for chest pain and palpitations. Negative for orthopnea and leg swelling.  Gastrointestinal: Negative for abdominal pain, constipation, diarrhea, nausea and vomiting.  Genitourinary: Negative for dysuria, frequency and hematuria.  Musculoskeletal: Negative for back pain, joint pain and neck pain.  Skin:       No acne, rash, or lesions  Neurological: Negative for dizziness, tremors, focal weakness and weakness.  Endo/Heme/Allergies: Negative for polydipsia. Does not bruise/bleed easily.  Psychiatric/Behavioral: Negative for depression. The patient is not nervous/anxious and does not have insomnia.      VITAL SIGNS:   Vitals:   11/04/15 2125 11/04/15 2126 11/04/15 2130 11/04/15 2230  BP: (!) 126/95  (!) 146/111 136/73  Pulse: 92 75 (!) 45 99  Resp: 18 15 18 15   Temp:      TempSrc:      SpO2: 98% 97% 97% 97%  Weight:      Height:       Wt Readings from Last 3 Encounters:  11/04/15 70.3 kg (155 lb)  09/25/15 69.9 kg (154 lb)  09/13/15 59 kg (130 lb 1.1 oz)    PHYSICAL EXAMINATION:  Physical Exam  Vitals reviewed. Constitutional: She is oriented to person, place, and time. She appears well-developed and well-nourished. No distress.  HENT:  Head: Normocephalic and atraumatic.  Mouth/Throat: Oropharynx is clear and moist.  Eyes: Conjunctivae and EOM are normal. Pupils are equal, round, and reactive to light. No scleral icterus.  Neck: Normal range of motion. Neck supple. No JVD present. No thyromegaly present.  Cardiovascular: Intact distal pulses.  Exam reveals no gallop and no friction rub.   No murmur heard. Tachycardic, irregular rhythm  Respiratory: Effort normal and breath sounds normal. No respiratory distress. She has no wheezes. She has  no rales.  GI: Soft. Bowel sounds are normal. She exhibits no distension. There is no tenderness.  Musculoskeletal: Normal range of motion. She exhibits no edema.  No arthritis, no gout  Lymphadenopathy:    She has no cervical adenopathy.  Neurological: She is alert and oriented to person, place, and time. No cranial nerve deficit.  No dysarthria, no aphasia  Skin: Skin is warm and dry. No rash noted. No erythema.  Psychiatric: She has a normal mood and affect. Her behavior is normal. Judgment and thought content normal.    LABORATORY PANEL:   CBC  Recent Labs Lab 11/04/15 2043  WBC 11.4*  HGB 12.4  HCT 37.1  PLT 234   ------------------------------------------------------------------------------------------------------------------  Chemistries   Recent Labs Lab 11/04/15 2043  NA  141  K 3.8  CL 107  CO2 25  GLUCOSE 189*  BUN 21*  CREATININE 0.38*  CALCIUM 8.9  AST 19  ALT 23  ALKPHOS 109  BILITOT 0.4   ------------------------------------------------------------------------------------------------------------------  Cardiac Enzymes  Recent Labs Lab 11/04/15 2043  TROPONINI <0.03   ------------------------------------------------------------------------------------------------------------------  RADIOLOGY:  Dg Chest 2 View  Result Date: 11/04/2015 CLINICAL DATA:  Pt reports around 2 pm she was awakened from sleep with tight, burning pain to her mid sternal chest . Pt reports +nausea, +shortness of breath with the pain but denies diaphoresis. Pt talking in full and complete sentences with .*comment was truncated* EXAM: CHEST  2 VIEW COMPARISON:  09/13/2015 FINDINGS: Sternotomy wires overlie normal cardiac silhouette. No effusion, infiltrate pneumothorax. Lungs mildly hyperinflated. Degenerative osteophytosis of the thoracic spine. IMPRESSION: Hyperinflated lungs.  No acute findings. Electronically Signed   By: Genevive Bi M.D.   On: 11/04/2015 21:10     EKG:   Orders placed or performed during the hospital encounter of 11/04/15  . EKG 12-Lead  . EKG 12-Lead  . ED EKG within 10 minutes  . ED EKG within 10 minutes    IMPRESSION AND PLAN:  Principal Problem:   Chest pain - initial cardiac enzyme was negative, we will trend them tonight. Suspect the chest pain is due to her A. fib with RVR. See below for treatment of the same. Cardiology consult in the morning. Active Problems:   Atrial fibrillation with RVR (HCC) - responded well initially to IV diltiazem in the ED, we will repeat this dose here to see if we can get her heart rate under 110 consistently. Continue other home meds for A. fib, cardiology consult   HTN (hypertension) - currently stable, continue home meds   CAD (coronary artery disease) - continue home regimen for the same, trend troponins as above   Chronic systolic CHF (congestive heart failure) (HCC) - currently stable, continue home meds   Diabetes (HCC) - Lahey scale insulin with corresponding glucose checks and carb modified diet   HLD (hyperlipidemia) - continue home meds  All the records are reviewed and case discussed with ED provider. Management plans discussed with the patient and/or family.  DVT PROPHYLAXIS: Systemic anticoagulation  GI PROPHYLAXIS: None  ADMISSION STATUS: Observation  CODE STATUS: Full Code Status History    Date Active Date Inactive Code Status Order ID Comments User Context   09/13/2015 10:10 AM 09/15/2015  7:33 PM Full Code [id number]  Altamese Dilling, MD Inpatient      TOTAL TIME TAKING CARE OF THIS PATIENT: 40 minutes.    Bettymae Yott FIELDING 11/04/2015, 11:28 PM  [facility]  Office  [phone]  CC: Primary care physician; Leanna Sato, MD

## 2015-11-04 NOTE — ED Provider Notes (Signed)
[facility] Emergency Department Provider Note   ____________________________________________   [facility] Contact with Patient 11/04/15 2040     (approximate)  I have reviewed the triage vital signs and the nursing notes.   HISTORY  Chief Complaint Chest Pain    HPI Robin [name] is a 75 y.o. female with history of atrial fibrillation on Xarelto, CAD, CHF not on Lasix, chronic left lower extremity swelling with multiple negative doppler ultrasounds who presents for evaluation of several hours of intermittent sternal chest pain which she describes as "burning" gradual onset, constant, moderate, no modifying factors. She also reports that she knows her "A. fib is acting up" she has had palpitations and increased heart rate for several hours. Mild shortness of breath. No vomiting, diarrhea, fevers or chills. She took her metoprolol prior to arrival to the emergency department.   Past Medical History:  Diagnosis Date  . Afib (HCC)   . Anxiety   . CHF (congestive heart failure) (HCC)   . Coronary artery disease   . Diabetes mellitus without complication (HCC)   . Hyperlipidemia   . Hypertension     Patient Active Problem List   Diagnosis Date Noted  . Chest pain 11/04/2015  . HTN (hypertension) 11/04/2015  . HLD (hyperlipidemia) 11/04/2015  . CAD (coronary artery disease) 11/04/2015  . Chronic systolic CHF (congestive heart failure) (HCC) 11/04/2015  . Diabetes (HCC) 11/04/2015  . Atrial fibrillation with RVR (HCC) 09/13/2015    Past Surgical History:  Procedure Laterality Date  . NO PAST SURGERIES      Prior to Admission medications   Medication Sig Start Date End Date Taking? Authorizing Provider  albuterol (ACCUNEB) 1.25 MG/3ML nebulizer solution Take 1.25 mg by nebulization every 4 (four) hours as needed for wheezing or shortness of breath.     Historical Provider, MD  albuterol-ipratropium (COMBIVENT) 18-103 MCG/ACT inhaler  Inhale 2 puffs into the lungs 4 (four) times daily as needed for wheezing or shortness of breath.    Historical Provider, MD  amLODipine (NORVASC) 10 MG tablet Take 1 tablet (10 mg total) by mouth daily. 09/15/15   Auburn BilberryShreyang Patel, MD  atorvastatin (LIPITOR) 80 MG tablet Take 1 tablet (80 mg total) by mouth daily. 09/15/15   Auburn BilberryShreyang Patel, MD  cephALEXin (KEFLEX) 500 MG capsule Take 1 capsule (500 mg total) by mouth 3 (three) times daily. 09/25/15   Irean HongJade J Sung, MD  diltiazem (CARDIZEM CD) 180 MG 24 hr capsule Take 1 capsule (180 mg total) by mouth daily. 09/15/15   Auburn BilberryShreyang Patel, MD  furosemide (LASIX) 20 MG tablet Take 1 tablet (20 mg total) by mouth daily. 09/25/15   Irean HongJade J Sung, MD  glipiZIDE (GLUCOTROL) 5 MG tablet Take 5 mg by mouth daily before breakfast.    Historical Provider, MD  lisinopril (PRINIVIL,ZESTRIL) 20 MG tablet Take 1 tablet (20 mg total) by mouth daily. 09/15/15   Auburn BilberryShreyang Patel, MD  meclizine (ANTIVERT) 25 MG tablet Take 25 mg by mouth 2 (two) times daily as needed for dizziness.    Historical Provider, MD  metFORMIN (GLUCOPHAGE) 500 MG tablet Take 1 tablet (500 mg total) by mouth 2 (two) times daily with a meal. 09/15/15   Auburn BilberryShreyang Patel, MD  metoprolol succinate (TOPROL-XL) 25 MG 24 hr tablet Take 1 tablet (25 mg total) by mouth daily. 09/15/15   Auburn BilberryShreyang Patel, MD  nitroGLYCERIN (NITROSTAT) 0.4 MG SL tablet Place 0.4 mg under the tongue every 5 (five) minutes as needed for chest  pain.     Historical Provider, MD  oxyCODONE-acetaminophen (PERCOCET/ROXICET) 5-325 MG tablet Take 1 tablet by mouth every 8 (eight) hours as needed for moderate pain or severe pain.    Historical Provider, MD  rivaroxaban (XARELTO) 20 MG TABS tablet Take 1 tablet (20 mg total) by mouth daily. 09/15/15   Auburn Bilberry, MD    Allergies Review of patient's allergies indicates no known allergies.  Family History  Problem Relation Age of Onset  . Hypertension Mother     Social History Social History    Substance Use Topics  . Smoking status: Never Smoker  . Smokeless tobacco: Not on file  . Alcohol use No    Review of Systems Constitutional: No fever/chills Eyes: No visual changes. ENT: No sore throat. Cardiovascular: + chest pain. Respiratory: +shortness of breath. Gastrointestinal: No abdominal pain.  No nausea, no vomiting.  No diarrhea.  No constipation. Genitourinary: Negative for dysuria. Musculoskeletal: Negative for back pain. Skin: Negative for rash. Neurological: Negative for headaches, focal weakness or numbness.  10-point ROS otherwise negative.  ____________________________________________   PHYSICAL EXAM:  Vitals:   11/04/15 2130 11/04/15 2230 11/04/15 2330 11/05/15 0020  BP: (!) 146/111 136/73 118/82 (!) 152/66  Pulse: (!) 45 99 (!) 44 (!) 118  Resp: 18 15 17 18   Temp:    97.7 F (36.5 C)  TempSrc:    Oral  SpO2: 97% 97% 96% 96%  Weight:    157 lb 12.8 oz (71.6 kg)  Height:    5\' 4"  (1.626 m)    VITAL SIGNS: ED Triage Vitals  Enc Vitals Group     BP 11/04/15 2007 (!) 147/79     Pulse Rate 11/04/15 2007 (!) 132     Resp 11/04/15 2007 18     Temp 11/04/15 2007 98 F (36.7 C)     Temp Source 11/04/15 2007 Oral     SpO2 11/04/15 2007 98 %     Weight 11/04/15 2008 155 lb (70.3 kg)     Height 11/04/15 2008 5\' 4"  (1.626 m)     Head Circumference --      Peak Flow --      Pain Score 11/04/15 2009 10     Pain Loc --      Pain Edu? --      Excl. in GC? --     Constitutional: Alert and oriented. Well appearing and in no acute distress. Eyes: Conjunctivae are normal. PERRL. EOMI. Head: Atraumatic. Nose: No congestion/rhinnorhea. Mouth/Throat: Mucous membranes are moist.  Oropharynx non-erythematous. Neck: No stridor. Supple without meningismus. Cardiovascular: Tachycardic rate, irregular rhythm. Grossly normal heart sounds.  Good peripheral circulation. Respiratory: Normal respiratory effort.  No retractions. Lungs CTAB. Gastrointestinal: Soft  and nontender. No distention.  No CVA tenderness. Genitourinary: Deferred Musculoskeletal: No lower extremity tenderness nor edema.  No joint effusions. Neurologic:  Normal speech and language. No gross focal neurologic deficits are appreciated. No gait instability. Skin:  Skin is warm, dry and intact. No rash noted. Psychiatric: Mood and affect are normal. Speech and behavior are normal.  ____________________________________________   LABS (all labs ordered are listed, but only abnormal results are displayed)  Labs Reviewed  CBC WITH DIFFERENTIAL/PLATELET - Abnormal; Notable for the following:       Result Value   WBC 11.4 (*)    RDW 14.7 (*)    Neutro Abs 7.6 (*)    All other components within normal limits  HEPATIC FUNCTION PANEL - Abnormal; Notable for the  following:    Bilirubin, Direct <0.1 (*)    All other components within normal limits  BASIC METABOLIC PANEL - Abnormal; Notable for the following:    Glucose, Bld 189 (*)    BUN 21 (*)    Creatinine, Ser 0.38 (*)    All other components within normal limits  LIPASE, BLOOD  TROPONIN I  PROTIME-INR  TROPONIN I  TROPONIN I  TROPONIN I  HEMOGLOBIN A1C  BASIC METABOLIC PANEL  CBC   ____________________________________________  EKG  ED ECG REPORT I, Gayla Doss, the attending physician, personally viewed and interpreted this ECG.   Date: 11/04/2015  EKG Time: 20:10  Rate: 123  Rhythm: atrial fibrillation, rate 123  Axis: normal  Intervals: Left anterior fascicular block.  ST&T Change: No acute ST elevation or acute ST depression.  ____________________________________________  RADIOLOGY  CXR IMPRESSION:  Hyperinflated lungs. No acute findings.         ____________________________________________   PROCEDURES  Procedure(s) performed: None  Procedures  Critical Care performed: No  ____________________________________________   INITIAL IMPRESSION / ASSESSMENT AND PLAN / ED  COURSE  Pertinent labs & imaging results that were available during my care of the patient were reviewed by me and considered in my medical decision making (see chart for details).  Robin [name] is a 75 y.o. female with history of atrial fibrillation on Xarelto, CAD, CHF not on Lasix, chronic left lower extremity swelling with multiple negative doppler ultrasounds who presents for evaluation of several hours of intermittent sternal chest pain as well as palpitations with sensation of heart racing. On arrival to the emergency department, she has atrial fibrillation with rapid ventricular rate, heart rate approximately 130 bpm. The remainder of her vital signs are stable and she is afebrile. Her heart rate improved to 110 bpm after bolus of Cardizem. Initial troponin is negative. Case discussed with the hospitalist for admission.  Clinical Course     ____________________________________________   FINAL CLINICAL IMPRESSION(S) / ED DIAGNOSES  Final diagnoses:  Chest pain, unspecified chest pain type  Atrial fibrillation, unspecified type (HCC)      NEW MEDICATIONS STARTED DURING THIS VISIT:  Current Discharge Medication List       Note:  This document was prepared using Dragon voice recognition software and may include unintentional dictation errors.    Gayla Doss, MD 11/05/15 [phone]

## 2015-11-05 DIAGNOSIS — I110 Hypertensive heart disease with heart failure: Secondary | ICD-10-CM | POA: Diagnosis not present

## 2015-11-05 LAB — CBC
HCT: 34 % — ABNORMAL LOW (ref 35.0–47.0)
Hemoglobin: 11.7 g/dL — ABNORMAL LOW (ref 12.0–16.0)
MCH: 28.2 pg (ref 26.0–34.0)
MCHC: 34.3 g/dL (ref 32.0–36.0)
MCV: 82.3 fL (ref 80.0–100.0)
Platelets: 218 10*3/uL (ref 150–440)
RBC: 4.13 MIL/uL (ref 3.80–5.20)
RDW: 14.6 % — AB (ref 11.5–14.5)
WBC: 9.6 10*3/uL (ref 3.6–11.0)

## 2015-11-05 LAB — GLUCOSE, CAPILLARY
GLUCOSE-CAPILLARY: 161 mg/dL — AB (ref 65–99)
GLUCOSE-CAPILLARY: 164 mg/dL — AB (ref 65–99)
GLUCOSE-CAPILLARY: 243 mg/dL — AB (ref 65–99)
GLUCOSE-CAPILLARY: 244 mg/dL — AB (ref 65–99)
Glucose-Capillary: 150 mg/dL — ABNORMAL HIGH (ref 65–99)
Glucose-Capillary: 222 mg/dL — ABNORMAL HIGH (ref 65–99)

## 2015-11-05 LAB — BASIC METABOLIC PANEL
ANION GAP: 6 (ref 5–15)
BUN: 19 mg/dL (ref 6–20)
CALCIUM: 8.4 mg/dL — AB (ref 8.9–10.3)
CO2: 27 mmol/L (ref 22–32)
CREATININE: 0.55 mg/dL (ref 0.44–1.00)
Chloride: 110 mmol/L (ref 101–111)
GFR calc Af Amer: >60 mL/min (ref >60)
GLUCOSE: 184 mg/dL — AB (ref 65–99)
Potassium: 3.6 mmol/L (ref 3.5–5.1)
Sodium: 143 mmol/L (ref 135–145)

## 2015-11-05 LAB — URINALYSIS COMPLETE WITH MICROSCOPIC (ARMC ONLY)
BACTERIA UA: NONE SEEN
Bilirubin Urine: NEGATIVE
Glucose, UA: 150 mg/dL — AB
HGB URINE DIPSTICK: NEGATIVE
KETONES UR: NEGATIVE mg/dL
LEUKOCYTES UA: NEGATIVE
Nitrite: NEGATIVE
PH: 6 (ref 5.0–8.0)
PROTEIN: 30 mg/dL — AB
Specific Gravity, Urine: 1.015 (ref 1.005–1.030)

## 2015-11-05 LAB — MAGNESIUM: Magnesium: 1.9 mg/dL (ref 1.7–2.4)

## 2015-11-05 LAB — HEMOGLOBIN A1C: HEMOGLOBIN A1C: 8.8 % — AB (ref 4.0–6.0)

## 2015-11-05 LAB — TROPONIN I: Troponin I: <0.03 ng/mL (ref <0.03)

## 2015-11-05 LAB — TSH: TSH: 1.492 u[IU]/mL (ref 0.350–4.500)

## 2015-11-05 MED ORDER — ONDANSETRON HCL 4 MG/2ML IJ SOLN
4.0000 mg | Freq: Four times a day (QID) | INTRAMUSCULAR | Status: DC | PRN
  Administered 2015-11-05 – 2015-11-06 (×2): 4 mg via INTRAVENOUS
  Filled 2015-11-05 (×2): qty 2

## 2015-11-05 MED ORDER — METOPROLOL TARTRATE 25 MG PO TABS
25.0000 mg | ORAL_TABLET | Freq: Three times a day (TID) | ORAL | Status: DC
  Administered 2015-11-05: 25 mg via ORAL
  Filled 2015-11-05: qty 1

## 2015-11-05 MED ORDER — ACETAMINOPHEN 650 MG RE SUPP: 650.0000 mg | Freq: Four times a day (QID) | RECTAL | Status: DC | PRN

## 2015-11-05 MED ORDER — DILTIAZEM HCL 25 MG/5ML IV SOLN
10.0000 mg | Freq: Once | INTRAVENOUS | Status: AC
  Administered 2015-11-05: 10 mg via INTRAVENOUS
  Filled 2015-11-05: qty 5

## 2015-11-05 MED ORDER — RIVAROXABAN 20 MG PO TABS
20.0000 mg | ORAL_TABLET | Freq: Every day | ORAL | Status: DC
  Administered 2015-11-05 – 2015-11-06 (×2): 20 mg via ORAL
  Filled 2015-11-05 (×2): qty 1

## 2015-11-05 MED ORDER — INSULIN ASPART 100 UNIT/ML ~~LOC~~ SOLN: 0.0000 [IU] | Freq: Every day | SUBCUTANEOUS | Status: DC

## 2015-11-05 MED ORDER — ONDANSETRON HCL 4 MG PO TABS: 4.0000 mg | ORAL_TABLET | Freq: Four times a day (QID) | ORAL | Status: DC | PRN

## 2015-11-05 MED ORDER — METOPROLOL TARTRATE 25 MG PO TABS
25.0000 mg | ORAL_TABLET | Freq: Two times a day (BID) | ORAL | Status: DC
  Administered 2015-11-06: 25 mg via ORAL
  Filled 2015-11-05 (×2): qty 1

## 2015-11-05 MED ORDER — ALUM & MAG HYDROXIDE-SIMETH 200-200-20 MG/5ML PO SUSP: 30.0000 mL | Freq: Four times a day (QID) | ORAL | Status: DC | PRN

## 2015-11-05 MED ORDER — FUROSEMIDE 20 MG PO TABS
20.0000 mg | ORAL_TABLET | Freq: Every day | ORAL | Status: DC
  Filled 2015-11-05 (×2): qty 1

## 2015-11-05 MED ORDER — DILTIAZEM HCL ER COATED BEADS 180 MG PO CP24
180.0000 mg | ORAL_CAPSULE | Freq: Every day | ORAL | Status: DC
  Administered 2015-11-05 – 2015-11-06 (×2): 180 mg via ORAL
  Filled 2015-11-05 (×2): qty 1

## 2015-11-05 MED ORDER — AMLODIPINE BESYLATE 10 MG PO TABS
10.0000 mg | ORAL_TABLET | Freq: Every day | ORAL | Status: DC
  Administered 2015-11-05 – 2015-11-06 (×2): 10 mg via ORAL
  Filled 2015-11-05 (×2): qty 1

## 2015-11-05 MED ORDER — POTASSIUM CHLORIDE CRYS ER 20 MEQ PO TBCR
40.0000 meq | EXTENDED_RELEASE_TABLET | Freq: Once | ORAL | Status: AC
  Administered 2015-11-05: 40 meq via ORAL
  Filled 2015-11-05: qty 2

## 2015-11-05 MED ORDER — LISINOPRIL 20 MG PO TABS
20.0000 mg | ORAL_TABLET | Freq: Every day | ORAL | Status: DC
  Administered 2015-11-05 – 2015-11-06 (×2): 20 mg via ORAL
  Filled 2015-11-05 (×2): qty 1

## 2015-11-05 MED ORDER — ACETAMINOPHEN 325 MG PO TABS
650.0000 mg | ORAL_TABLET | Freq: Four times a day (QID) | ORAL | Status: DC | PRN
  Administered 2015-11-05: 650 mg via ORAL
  Filled 2015-11-05 (×2): qty 2

## 2015-11-05 MED ORDER — INSULIN ASPART 100 UNIT/ML ~~LOC~~ SOLN
0.0000 [IU] | Freq: Three times a day (TID) | SUBCUTANEOUS | Status: DC
  Administered 2015-11-05 (×2): 3 [IU] via SUBCUTANEOUS
  Administered 2015-11-05 – 2015-11-06 (×2): 1 [IU] via SUBCUTANEOUS
  Administered 2015-11-06: 2 [IU] via SUBCUTANEOUS
  Filled 2015-11-05: qty 3
  Filled 2015-11-05 (×2): qty 1
  Filled 2015-11-05: qty 3
  Filled 2015-11-05: qty 2

## 2015-11-05 MED ORDER — METOPROLOL SUCCINATE ER 25 MG PO TB24: 25.0000 mg | ORAL_TABLET | Freq: Every day | ORAL | Status: DC

## 2015-11-05 MED ORDER — PANTOPRAZOLE SODIUM 40 MG PO TBEC
40.0000 mg | DELAYED_RELEASE_TABLET | Freq: Every day | ORAL | Status: DC
  Administered 2015-11-05 – 2015-11-06 (×2): 40 mg via ORAL
  Filled 2015-11-05 (×2): qty 1

## 2015-11-05 MED ORDER — ATORVASTATIN CALCIUM 20 MG PO TABS
80.0000 mg | ORAL_TABLET | Freq: Every day | ORAL | Status: DC
  Administered 2015-11-05 – 2015-11-06 (×2): 80 mg via ORAL
  Filled 2015-11-05 (×2): qty 4

## 2015-11-05 NOTE — Consult Note (Signed)
[facility]  CARDIOLOGY CONSULT NOTE  Patient ID: Robin [name] MRN: [id number] DOB/AGE: 75/04/1940 75 y.o.  Admit date: 11/04/2015 Referring Physician Dr. Nemiah CommanderKalisetti Primary Physician Dr. Darreld McleanLinda Miles Primary Cardiologist Dr. Mariel KanskyKen Marvis Saefong Reason for Consultation chest pain/afib  HPI: Pt is a 75 yo female with history of cad s/p cabg and history of afib and frequent episdoes of chest pain. [facility] with [facility]. Her initial troponin was unremarkable. She deferred any further troponin levels. EKG during the presentation was unremarkable. He had a negative CT for pulmonary embolus. She has persistent afib and is currently on xarelto for anticoagulation. She has ruled out for an mi and rate is in better control with increasing the metoprolol.       Review of Systems  HENT: Negative.   Eyes: Negative.   Respiratory: Positive for shortness of breath.   Cardiovascular: Positive for chest pain, palpitations and leg swelling.  Gastrointestinal: Negative.   Genitourinary: Negative.   Musculoskeletal: Positive for myalgias.  Skin: Negative.   Neurological: Positive for weakness.  Endo/Heme/Allergies: Negative.   Psychiatric/Behavioral: The patient is nervous/anxious.     Past Medical History:  Diagnosis Date  . Afib (HCC)   . Anxiety   . CHF (congestive heart failure) (HCC)   . Coronary artery disease   . Diabetes mellitus without complication (HCC)   . Hyperlipidemia   . Hypertension     Family History  Problem Relation Age of Onset  . Hypertension Mother     Social History   Social History  . Marital status: Single    Spouse name: N/A  . Number of children: N/A  . Years of education: N/A   Occupational History  . Not on file.   Social History Main Topics  . Smoking status: Never Smoker  . Smokeless tobacco: Never Used  . Alcohol use No  . Drug use: No  . Sexual activity: No   Other  Topics Concern  . Not on file   Social History Narrative  . No narrative on file    Past Surgical History:  Procedure Laterality Date  . NO PAST SURGERIES       Prescriptions Prior to Admission  Medication Sig Dispense Refill Last Dose  . albuterol (ACCUNEB) 1.25 MG/3ML nebulizer solution Take 1.25 mg by nebulization every 4 (four) hours as needed for wheezing or shortness of breath.    prn at prn  . albuterol-ipratropium (COMBIVENT) 18-103 MCG/ACT inhaler Inhale 2 puffs into the lungs 4 (four) times daily as needed for wheezing or shortness of breath.   prn at prn  . amLODipine (NORVASC) 10 MG tablet Take 1 tablet (10 mg total) by mouth daily. 30 tablet 2 09/25/2015 at Unknown time  . atorvastatin (LIPITOR) 80 MG tablet Take 1 tablet (80 mg total) by mouth daily. 30 tablet 2 09/25/2015 at Unknown time  . cephALEXin (KEFLEX) 500 MG capsule Take 1 capsule (500 mg total) by mouth 3 (three) times daily. 21 capsule 0   . diltiazem (CARDIZEM CD) 180 MG 24 hr capsule Take 1 capsule (180 mg total) by mouth daily. 30 capsule 2 09/25/2015 at Unknown time  . furosemide (LASIX) 20 MG tablet Take 1 tablet (20 mg total) by mouth daily. 3 tablet 0   . glipiZIDE (GLUCOTROL) 5 MG tablet Take 5 mg by mouth daily before breakfast.   09/25/2015 at Unknown time  . lisinopril (PRINIVIL,ZESTRIL) 20 MG tablet Take 1 tablet (  20 mg total) by mouth daily. 30 tablet 0 09/25/2015 at Unknown time  . meclizine (ANTIVERT) 25 MG tablet Take 25 mg by mouth 2 (two) times daily as needed for dizziness.   prn at prn  . metFORMIN (GLUCOPHAGE) 500 MG tablet Take 1 tablet (500 mg total) by mouth 2 (two) times daily with a meal.   09/25/2015 at Unknown time  . metoprolol succinate (TOPROL-XL) 25 MG 24 hr tablet Take 1 tablet (25 mg total) by mouth daily. 30 tablet 2 09/25/2015 at Unknown time  . nitroGLYCERIN (NITROSTAT) 0.4 MG SL tablet Place 0.4 mg under the tongue every 5 (five) minutes as needed for chest pain.    prn at prn  .  oxyCODONE-acetaminophen (PERCOCET/ROXICET) 5-325 MG tablet Take 1 tablet by mouth every 8 (eight) hours as needed for moderate pain or severe pain.   Past Month at Unknown time  . rivaroxaban (XARELTO) 20 MG TABS tablet Take 1 tablet (20 mg total) by mouth daily. 30 tablet 2 09/25/2015 at Unknown time    Physical Exam: Blood pressure (!) 137/57, pulse 98, temperature 97.9 F (36.6 C), temperature source Oral, resp. rate 16, height 5\' 4"  (1.626 m), weight 71.1 kg (156 lb 12.8 oz), SpO2 97 %.   Wt Readings from Last 1 Encounters:  11/05/15 71.1 kg (156 lb 12.8 oz)     General appearance: alert and cooperative Resp: clear to auscultation bilaterally Chest wall: no tenderness Cardio: irregularly irregular rhythm GI: soft, non-tender; bowel sounds normal; no masses,  no organomegaly Extremities: edema left llower extremety edema Neurologic: Grossly normal  Labs:   Lab Results  Component Value Date   WBC 9.6 11/05/2015   HGB 11.7 (L) 11/05/2015   HCT 34.0 (L) 11/05/2015   MCV 82.3 11/05/2015   PLT 218 11/05/2015    Recent Labs Lab 11/04/15 2043 11/05/15 0610  NA 141 143  K 3.8 3.6  CL 107 110  CO2 25 27  BUN 21* 19  CREATININE 0.38* 0.55  CALCIUM 8.9 8.4*  PROT 6.9  --   BILITOT 0.4  --   ALKPHOS 109  --   ALT 23  --   AST 19  --   GLUCOSE 189* 184*   Lab Results  Component Value Date   CKTOTAL 79 02/11/2014   CKMB 1.3 02/11/2014   TROPONINI <0.03 11/05/2015      Radiology: no acute process on cxr EKG: afib with variable vr  ASSESSMENT AND PLAN:  Pt iwht history of cad s/p cabg, history of afib with rate control with metoprolol and anticoagulated with xarelto who was admitted with atypical chest -pain and afib with increased vr. She has ruled out for an mi. EKG is unchagnd. She has left lower extremety edema but u/s as outpatinet was negaative ofr dvt and [facility] ct [facility]. SHe is also anticoagulated so dvt/pe is unclikley. SHe is beter with  increased dose of metoprlol . Would ambulate today and if stable , consider discharge in am.  Signed: Dalia Heading MD, [facility] 11/05/2015, 8:54 AM

## 2015-11-05 NOTE — Progress Notes (Signed)
Pt. Refused HS medication. Pt. Educated on the risk of not taking her medications. Dr. Anne Hahn notified.

## 2015-11-05 NOTE — Progress Notes (Signed)
During morning assessment pt complained of increased urinary frequency and burning when voiding. Dr. Nemiah Commander updated and added U/A. Will collect specimen next time pt voids.

## 2015-11-05 NOTE — Progress Notes (Signed)
Pt. Had c/o nausea, zofran IV given. No emesis. Will continue to monitor pt.

## 2015-11-05 NOTE — Progress Notes (Signed)
Central telemetry called me to inform me that pt is having pauses (up to 2 seconds). I checked on pt and found her napping, she was easily arousable to voice, denies any discomfort, and remains oriented x 4. Dr. Nemiah Commander notified and per MD order will hold 1600 dose of metoprolol.

## 2015-11-05 NOTE — Progress Notes (Addendum)
Pt appears to have low urinary output today. When she voids her output is about 84ml at a time. And her voiding has only be recorded a couple of times since 0700 today. I bladder scanned pt and found 68ml in bladder. Pt also states that she "goes a lot" when she voids. I asked if she had missed the hat in the toilet when voiding and she states "I think so". Will place second hat in order to obtain better output measurements.   Update 1435: NT helped pt to restroom, and pt removed hat prior to voiding in toilet despite earlier conversation that I had with pt regarding necessity of monitoring her output. NT reported this behavior to me, and pt has been re-educated on importance of leaving hat in place so that her urine can be measured.

## 2015-11-05 NOTE — Progress Notes (Signed)
[employer] - [place] at [facility]   PATIENT NAME: Robin [name]    MR#:  [id number]  DATE OF BIRTH:  06/03/1940  SUBJECTIVE:  CHIEF COMPLAINT:   Chief Complaint  Patient presents with  . Chest Pain   - Patient very anxious. Remains in atrial fibrillation, heart rate ranging from 90-110. -Complains of dysuria today.  REVIEW OF SYSTEMS:  Review of Systems  Constitutional: Positive for malaise/fatigue. Negative for chills and fever.  HENT: Negative for ear discharge, ear pain and nosebleeds.   Eyes: Negative for blurred vision and double vision.  Respiratory: Negative for cough, shortness of breath and wheezing.   Cardiovascular: Negative for chest pain, palpitations and leg swelling.  Gastrointestinal: Negative for abdominal pain, constipation, diarrhea, nausea and vomiting.  Genitourinary: Positive for dysuria and frequency.  Musculoskeletal: Negative for myalgias and neck pain.  Neurological: Positive for dizziness. Negative for sensory change, speech change, focal weakness, seizures and headaches.  Psychiatric/Behavioral: Negative for depression.    DRUG ALLERGIES:  No Known Allergies  VITALS:  Blood pressure (!) 137/57, pulse 98, temperature 97.9 F (36.6 C), temperature source Oral, resp. rate 16, height 5\' 4"  (1.626 m), weight 71.1 kg (156 lb 12.8 oz), SpO2 97 %.  PHYSICAL EXAMINATION:  Physical Exam  GENERAL:  75 y.o.-year-old patient lying in the bed with no acute distress.  EYES: Pupils equal, round, reactive to light and accommodation. No scleral icterus. Extraocular muscles intact.  HEENT: Head atraumatic, normocephalic. Oropharynx and nasopharynx clear.  NECK:  Supple, no jugular venous distention. No thyroid enlargement, no tenderness.  LUNGS: Normal breath sounds bilaterally, no wheezing, rales,rhonchi or crepitation. No use of accessory muscles of respiration.  CARDIOVASCULAR: S1, S2 Irregular but normal rate. No murmurs, rubs, or  gallops.  ABDOMEN: Soft, nontender, nondistended. Bowel sounds present. No organomegaly or mass.  EXTREMITIES: No pedal edema, cyanosis, or clubbing.  NEUROLOGIC: Cranial nerves II through XII are intact. Muscle strength 5/5 in all extremities. Sensation intact. Gait not checked.  PSYCHIATRIC: The patient is alert and oriented x 3.  SKIN: No obvious rash, lesion, or ulcer.    LABORATORY PANEL:   CBC  Recent Labs Lab 11/05/15 0610  WBC 9.6  HGB 11.7*  HCT 34.0*  PLT 218   ------------------------------------------------------------------------------------------------------------------  Chemistries   Recent Labs Lab 11/04/15 2043 11/05/15 0610  NA 141 143  K 3.8 3.6  CL 107 110  CO2 25 27  GLUCOSE 189* 184*  BUN 21* 19  CREATININE 0.38* 0.55  CALCIUM 8.9 8.4*  MG  --  1.9  AST 19  --   ALT 23  --   ALKPHOS 109  --   BILITOT 0.4  --    ------------------------------------------------------------------------------------------------------------------  Cardiac Enzymes  Recent Labs Lab 11/05/15 0610  TROPONINI <0.03   ------------------------------------------------------------------------------------------------------------------  RADIOLOGY:  Dg Chest 2 View  Result Date: 11/04/2015 CLINICAL DATA:  Pt reports around 2 pm she was awakened from sleep with tight, burning pain to her mid sternal chest . Pt reports +nausea, +shortness of breath with the pain but denies diaphoresis. Pt talking in full and complete sentences with .*comment was truncated* EXAM: CHEST  2 VIEW COMPARISON:  09/13/2015 FINDINGS: Sternotomy wires overlie normal cardiac silhouette. No effusion, infiltrate pneumothorax. Lungs mildly hyperinflated. Degenerative osteophytosis of the thoracic spine. IMPRESSION: Hyperinflated lungs.  No acute findings. Electronically Signed   By: Genevive Bi M.D.   On: 11/04/2015 21:10    EKG:   Orders placed or performed during the hospital  encounter of  11/04/15  . EKG 12-Lead  . EKG 12-Lead  . ED EKG within 10 minutes  . ED EKG within 10 minutes    ASSESSMENT AND PLAN:   75 year old female with past medical history significant for CAD status post CABG, paroxysmal atrial fibrillation on Xarelto, systolic CHF, diabetes and hypertension presents to hospital secondary to palpitations and noted to be in A. fib with RVR  #1 paroxysmal atrial fibrillation with rapid ventricular response-status post cardioversion in January 2017. -Appreciate cardiology consult. Remains in A. fib. -Continue Cardizem. Increase her oral metoprolol -On Xarelto for anticoagulation  #2 dysuria-check urine analysis. Check orthostatics is complaining of dizziness  #3 GERD-on Protonix. Add Maalox as needed  #4 CAD status post CABG-continue cardiac medications. Troponins are negative 3. -Stable.  #5 hypokalemia-being replaced. Magnesium is at 1.9  #6 DVT prophylaxis-already on Xarelto   All the records are reviewed and case discussed with Care Management/Social Workerr. Management plans discussed with the patient, family and they are in agreement.  CODE STATUS: Full code  TOTAL TIME TAKING CARE OF THIS PATIENT: 35 minutes.   POSSIBLE D/C TOMORROW, DEPENDING ON CLINICAL CONDITION.   Enid BaasKALISETTI,Ramisa Duman M.D on 11/05/2015 at 9:38 AM  Between 7am to 6pm - Pager - [phone]  After 6pm go to [email or web address] - password [facility] [email or web address]  [facility] [place] [facility]  Office  [phone]  CC: Primary care physician; Leanna SatoMILES,LINDA M, MD

## 2015-11-05 NOTE — Care Management Obs Status (Signed)
MEDICARE OBSERVATION STATUS NOTIFICATION   Patient Details  Name: Robin [name] MRN: [id number] Date of Birth: 10/20/1940   Medicare Observation Status Notification Given:  Yes    Caren Macadam, RN 11/05/2015, 6:34 PM

## 2015-11-05 NOTE — Progress Notes (Signed)
Central telemetry called stating pt. Had 2.4 second pause. Per Dagoberto Reef pt. Has had many 2 second pauses today. Pt. Was in room visiting with family with no c/o pain, SOB or acute distress noted. Dr. Anne Hahn notified, no new orders at this time. Dr. Anne Hahn said to notify him if pauses become 3-4 seconds. Will continue to monitor pt.

## 2015-11-05 NOTE — Progress Notes (Signed)
Pt has continuous pulse oximetry ordered, spoke to Dr. Nemiah Commander about necessity for order. MD stated it can be d/c'd. During bedside report it was noted that pt's LLE is swollen with 2+ pitting edema. No redness noted, and there is no pitting edema to RLE. Dr. Nemiah Commander notified, stated she will assess need for additional workup during rounding.

## 2015-11-06 DIAGNOSIS — I110 Hypertensive heart disease with heart failure: Secondary | ICD-10-CM | POA: Diagnosis not present

## 2015-11-06 LAB — GLUCOSE, CAPILLARY
GLUCOSE-CAPILLARY: 148 mg/dL — AB (ref 65–99)
GLUCOSE-CAPILLARY: 180 mg/dL — AB (ref 65–99)

## 2015-11-06 MED ORDER — MECLIZINE HCL 25 MG PO TABS: 25.0000 mg | ORAL_TABLET | Freq: Three times a day (TID) | ORAL | 0 refills | Status: DC | PRN

## 2015-11-06 MED ORDER — CARISOPRODOL 350 MG PO TABS: 350.0000 mg | ORAL_TABLET | Freq: Every evening | ORAL | 0 refills | Status: DC | PRN

## 2015-11-06 MED ORDER — PANTOPRAZOLE SODIUM 40 MG PO TBEC: 40.0000 mg | DELAYED_RELEASE_TABLET | Freq: Every day | ORAL | 2 refills | Status: DC

## 2015-11-06 MED ORDER — ONDANSETRON 4 MG PO TBDP: 4.0000 mg | ORAL_TABLET | Freq: Three times a day (TID) | ORAL | 0 refills | Status: DC | PRN

## 2015-11-06 NOTE — Progress Notes (Signed)
Patient is to be discharged home today. Patient is in no acute distress at this time, and assessment is unchanged from this morning. Patient's IV is out, discharge paperwork has been discussed with patient/family and there are no questions or concerns at this time. Patient will be accompanied downstairs by staff and family via wheelchair.   

## 2015-11-06 NOTE — Discharge Summary (Signed)
[employer] -  at [facility]   PATIENT NAME: Robin [name]    MR#:  [id number]  DATE OF BIRTH:  April 01, 1940  DATE OF ADMISSION:  11/04/2015   ADMITTING PHYSICIAN: Oralia Manis, MD  DATE OF DISCHARGE: 11/06/2015  PRIMARY CARE PHYSICIAN: Leanna Sato, MD   ADMISSION DIAGNOSIS:   Chest pain, unspecified chest pain type [R07.9] Atrial fibrillation, unspecified type (HCC) [I48.91]  DISCHARGE DIAGNOSIS:   Principal Problem:   Chest pain Active Problems:   Atrial fibrillation with RVR (HCC)   HTN (hypertension)   HLD (hyperlipidemia)   CAD (coronary artery disease)   Chronic systolic CHF (congestive heart failure) (HCC)   Diabetes (HCC)   SECONDARY DIAGNOSIS:   Past Medical History:  Diagnosis Date  . Afib (HCC)   . Anxiety   . CHF (congestive heart failure) (HCC)   . Coronary artery disease   . Diabetes mellitus without complication (HCC)   . Hyperlipidemia   . Hypertension     HOSPITAL COURSE:   75 year old female with past medical history significant for CAD status post CABG, paroxysmal atrial fibrillation on Xarelto, systolic CHF, diabetes and hypertension presents to hospital secondary to palpitations and noted to be in A. fib with RVR  #1 paroxysmal atrial fibrillation with rapid ventricular response-status post cardioversion in January 2017. -Appreciate cardiology consult. Remains in A. fib. -Continue Cardizem. continue toprol -On Xarelto for anticoagulation  #2 DM- continue glipizide and metformin  #3 GERD-on Protonix.  #4 CAD status post CABG-continue cardiac medications. Troponins are negative 3. -Stable.  #5 hypokalemia-replaced. Magnesium is at 1.9  #6 Leg aches- soma QHS prn  Stable, ambulating- discharge today  DISCHARGE CONDITIONS:   Stable  CONSULTS OBTAINED:   Treatment Team:  Dalia Heading, MD  DRUG ALLERGIES:   No Known Allergies DISCHARGE MEDICATIONS:     Medication List    STOP taking  these medications   cephALEXin 500 MG capsule Commonly known as:  KEFLEX   oxyCODONE-acetaminophen 5-325 MG tablet Commonly known as:  PERCOCET/ROXICET     TAKE these medications   albuterol 1.25 MG/3ML nebulizer solution Commonly known as:  ACCUNEB Take 1.25 mg by nebulization every 4 (four) hours as needed for wheezing or shortness of breath.   albuterol-ipratropium 18-103 MCG/ACT inhaler Commonly known as:  COMBIVENT Inhale 2 puffs into the lungs 4 (four) times daily as needed for wheezing or shortness of breath.   amLODipine 10 MG tablet Commonly known as:  NORVASC Take 1 tablet (10 mg total) by mouth daily.   atorvastatin 80 MG tablet Commonly known as:  LIPITOR Take 1 tablet (80 mg total) by mouth daily.   carisoprodol 350 MG tablet Commonly known as:  SOMA Take 1 tablet (350 mg total) by mouth at bedtime as needed for muscle spasms.   diltiazem 180 MG 24 hr capsule Commonly known as:  CARDIZEM CD Take 1 capsule (180 mg total) by mouth daily.   furosemide 20 MG tablet Commonly known as:  LASIX Take 1 tablet (20 mg total) by mouth daily.   glipiZIDE 5 MG tablet Commonly known as:  GLUCOTROL Take 5 mg by mouth daily before breakfast.   lisinopril 20 MG tablet Commonly known as:  PRINIVIL,ZESTRIL Take 1 tablet (20 mg total) by mouth daily.   meclizine 25 MG tablet Commonly known as:  ANTIVERT Take 1 tablet (25 mg total) by mouth 3 (three) times daily as needed for dizziness. What changed:  when to take this   metFORMIN 500  MG tablet Commonly known as:  GLUCOPHAGE Take 1 tablet (500 mg total) by mouth 2 (two) times daily with a meal.   metoprolol succinate 25 MG 24 hr tablet Commonly known as:  TOPROL-XL Take 1 tablet (25 mg total) by mouth daily.   nitroGLYCERIN 0.4 MG SL tablet Commonly known as:  NITROSTAT Place 0.4 mg under the tongue every 5 (five) minutes as needed for chest pain.   ondansetron 4 MG disintegrating tablet Commonly known as:   ZOFRAN ODT Take 1 tablet (4 mg total) by mouth every 8 (eight) hours as needed for nausea or vomiting.   pantoprazole 40 MG tablet Commonly known as:  PROTONIX Take 1 tablet (40 mg total) by mouth daily.   rivaroxaban 20 MG Tabs tablet Commonly known as:  XARELTO Take 1 tablet (20 mg total) by mouth daily.        DISCHARGE INSTRUCTIONS:   1. Cardiology f/u in 1-2 weeks 2. PCP f/u in 2 weeks  DIET:   Cardiac diet  ACTIVITY:   Activity as tolerated  OXYGEN:   Home Oxygen: No.  Oxygen Delivery: room air  DISCHARGE LOCATION:   home   If you experience worsening of your admission symptoms, develop shortness of breath, life threatening emergency, suicidal or homicidal thoughts you must seek medical attention immediately by calling 911 or calling your MD immediately  if symptoms less severe.  You Must read complete instructions/literature along with all the possible adverse reactions/side effects for all the Medicines you take and that have been prescribed to you. Take any new Medicines after you have completely understood and accpet all the possible adverse reactions/side effects.   Please note  You were cared for by a hospitalist during your hospital stay. If you have any questions about your discharge medications or the care you received while you were in the hospital after you are discharged, you can call the unit and asked to speak with the hospitalist on call if the hospitalist that took care of you is not available. Once you are discharged, your primary care physician will handle any further medical issues. Please note that NO REFILLS for any discharge medications will be authorized once you are discharged, as it is imperative that you return to your primary care physician (or establish a relationship with a primary care physician if you do not have one) for your aftercare needs so that they can reassess your need for medications and monitor your lab values.    On the  day of Discharge:  VITAL SIGNS:   Blood pressure (!) 167/56, pulse 64, temperature 97.9 F (36.6 C), temperature source Oral, resp. rate 18, height 5\' 4"  (1.626 m), weight 71.3 kg (157 lb 1.6 oz), SpO2 98 %.  PHYSICAL EXAMINATION:    GENERAL:  75 y.o.-year-old patient lying in the bed with no acute distress.  EYES: Pupils equal, round, reactive to light and accommodation. No scleral icterus. Extraocular muscles intact.  HEENT: Head atraumatic, normocephalic. Oropharynx and nasopharynx clear.  NECK:  Supple, no jugular venous distention. No thyroid enlargement, no tenderness.  LUNGS: Normal breath sounds bilaterally, no wheezing, rales,rhonchi or crepitation. No use of accessory muscles of respiration.  CARDIOVASCULAR: S1, S2 Irregular but normal rate. No murmurs, rubs, or gallops.  ABDOMEN: Soft, nontender, nondistended. Bowel sounds present. No organomegaly or mass.  EXTREMITIES: No pedal edema, cyanosis, or clubbing.  NEUROLOGIC: Cranial nerves II through XII are intact. Muscle strength 5/5 in all extremities. Sensation intact. Gait not checked.  PSYCHIATRIC: The  patient is alert and oriented x 3.  SKIN: No obvious rash, lesion, or ulcer.    DATA REVIEW:   CBC  Recent Labs Lab 11/05/15 0610  WBC 9.6  HGB 11.7*  HCT 34.0*  PLT 218    Chemistries   Recent Labs Lab 11/04/15 2043 11/05/15 0610  NA 141 143  K 3.8 3.6  CL 107 110  CO2 25 27  GLUCOSE 189* 184*  BUN 21* 19  CREATININE 0.38* 0.55  CALCIUM 8.9 8.4*  MG  --  1.9  AST 19  --   ALT 23  --   ALKPHOS 109  --   BILITOT 0.4  --      Microbiology Results  Results for orders placed or performed during the hospital encounter of 09/13/15  MRSA PCR Screening     Status: None   Collection Time: 09/13/15  9:54 AM  Result Value Ref Range Status   MRSA by PCR NEGATIVE NEGATIVE Final    Comment:        The GeneXpert MRSA Assay (FDA approved for NASAL specimens only), is one component of a comprehensive  MRSA colonization surveillance program. It is not intended to diagnose MRSA infection nor to guide or monitor treatment for MRSA infections.     RADIOLOGY:  No results found.   Management plans discussed with the patient, family and they are in agreement.  CODE STATUS:     Code Status Orders        Start     Ordered   11/05/15 0034  Full code  Continuous     11/05/15 0033    Code Status History    Date Active Date Inactive Code Status Order ID Comments User Context   09/13/2015 10:10 AM 09/15/2015  7:33 PM Full Code [id number]  Altamese DillingVaibhavkumar Vachhani, MD Inpatient      TOTAL TIME TAKING CARE OF THIS PATIENT: 37 minutes.    Enid BaasKALISETTI,Mikhael Hendriks M.D on 11/06/2015 at 12:37 PM  Between 7am to 6pm - Pager - [phone]  After 6pm go to [email or web address] - Social research officer, governmentpassword EPAS [email or web address]  [facility]  Office  [phone]  CC: Primary care physician; Leanna SatoMILES,LINDA M, MD   Note: This dictation was prepared with Dragon dictation along with smaller phrase technology. Any transcriptional errors that result from this process are unintentional.

## 2015-11-06 NOTE — Progress Notes (Signed)
Pt took medications spaced apart, but refused her furosemide. Dr. Nemiah Commander updated.

## 2015-11-06 NOTE — Progress Notes (Signed)
Pt requesting to space morning meds out as she does not want to get sick on her stomach. Per pt request will hold off on lisinopril, furosemide, atorvastatin, and xarelto until later this AM.

## 2015-11-06 NOTE — Progress Notes (Signed)
Pt. Slept well throughout the night with no signs or c/o pain, SOB or acute distress noted. Will continue to monitor pt.

## 2015-11-25 ENCOUNTER — Inpatient Hospital Stay
Admission: EM | Admit: 2015-11-25 | Discharge: 2015-11-26 | DRG: 309 | Disposition: A | Discharge status: Home / Self Care | Payer: Medicare Other | Attending: Internal Medicine | Admitting: Internal Medicine

## 2015-11-25 ENCOUNTER — Emergency Department: Payer: Medicare Other

## 2015-11-25 ENCOUNTER — Encounter: Payer: Self-pay | Admitting: *Deleted

## 2015-11-25 DIAGNOSIS — I480 Paroxysmal atrial fibrillation: Secondary | ICD-10-CM | POA: Diagnosis present

## 2015-11-25 DIAGNOSIS — I2510 Atherosclerotic heart disease of native coronary artery without angina pectoris: Secondary | ICD-10-CM | POA: Diagnosis present

## 2015-11-25 DIAGNOSIS — Z7984 Long term (current) use of oral hypoglycemic drugs: Secondary | ICD-10-CM

## 2015-11-25 DIAGNOSIS — Z955 Presence of coronary angioplasty implant and graft: Secondary | ICD-10-CM | POA: Diagnosis not present

## 2015-11-25 DIAGNOSIS — E785 Hyperlipidemia, unspecified: Secondary | ICD-10-CM | POA: Diagnosis present

## 2015-11-25 DIAGNOSIS — I5032 Chronic diastolic (congestive) heart failure: Secondary | ICD-10-CM | POA: Diagnosis present

## 2015-11-25 DIAGNOSIS — Z79899 Other long term (current) drug therapy: Secondary | ICD-10-CM

## 2015-11-25 DIAGNOSIS — Z8249 Family history of ischemic heart disease and other diseases of the circulatory system: Secondary | ICD-10-CM | POA: Diagnosis not present

## 2015-11-25 DIAGNOSIS — E119 Type 2 diabetes mellitus without complications: Secondary | ICD-10-CM | POA: Diagnosis present

## 2015-11-25 DIAGNOSIS — Z951 Presence of aortocoronary bypass graft: Secondary | ICD-10-CM

## 2015-11-25 DIAGNOSIS — Z7901 Long term (current) use of anticoagulants: Secondary | ICD-10-CM

## 2015-11-25 DIAGNOSIS — I110 Hypertensive heart disease with heart failure: Secondary | ICD-10-CM | POA: Diagnosis present

## 2015-11-25 DIAGNOSIS — I4891 Unspecified atrial fibrillation: Secondary | ICD-10-CM | POA: Diagnosis present

## 2015-11-25 DIAGNOSIS — K219 Gastro-esophageal reflux disease without esophagitis: Secondary | ICD-10-CM | POA: Diagnosis present

## 2015-11-25 DIAGNOSIS — R079 Chest pain, unspecified: Secondary | ICD-10-CM

## 2015-11-25 LAB — COMPREHENSIVE METABOLIC PANEL
ALBUMIN: 3.8 g/dL (ref 3.5–5.0)
ALK PHOS: 109 U/L (ref 38–126)
ALT: 30 U/L (ref 14–54)
AST: 26 U/L (ref 15–41)
Anion gap: 9 (ref 5–15)
BILIRUBIN TOTAL: 0.4 mg/dL (ref 0.3–1.2)
BUN: 19 mg/dL (ref 6–20)
CALCIUM: 9 mg/dL (ref 8.9–10.3)
CO2: 23 mmol/L (ref 22–32)
Chloride: 109 mmol/L (ref 101–111)
Creatinine, Ser: 0.6 mg/dL (ref 0.44–1.00)
GFR calc Af Amer: >60 mL/min (ref >60)
GFR calc non Af Amer: >60 mL/min (ref >60)
GLUCOSE: 264 mg/dL — AB (ref 65–99)
POTASSIUM: 3.9 mmol/L (ref 3.5–5.1)
SODIUM: 141 mmol/L (ref 135–145)
TOTAL PROTEIN: 7.2 g/dL (ref 6.5–8.1)

## 2015-11-25 LAB — URINALYSIS COMPLETE WITH MICROSCOPIC (ARMC ONLY)
BILIRUBIN URINE: NEGATIVE
Bacteria, UA: NONE SEEN
Glucose, UA: >500 mg/dL — AB
HGB URINE DIPSTICK: NEGATIVE
LEUKOCYTES UA: NEGATIVE
Nitrite: NEGATIVE
PH: 5 (ref 5.0–8.0)
PROTEIN: 100 mg/dL — AB
SPECIFIC GRAVITY, URINE: 1.017 (ref 1.005–1.030)

## 2015-11-25 LAB — TROPONIN I
Troponin I: <0.03 ng/mL (ref <0.03)
Troponin I: <0.03 ng/mL (ref <0.03)
Troponin I: <0.03 ng/mL (ref <0.03)
Troponin I: <0.03 ng/mL (ref <0.03)

## 2015-11-25 LAB — CBC WITH DIFFERENTIAL/PLATELET
BASOS ABS: 0 10*3/uL (ref 0–0.1)
BASOS PCT: 1 %
EOS ABS: 0 10*3/uL (ref 0–0.7)
Eosinophils Relative: 0 %
HEMATOCRIT: 40.4 % (ref 35.0–47.0)
HEMOGLOBIN: 13.2 g/dL (ref 12.0–16.0)
Lymphocytes Relative: 13 %
Lymphs Abs: 1.2 10*3/uL (ref 1.0–3.6)
MCH: 28 pg (ref 26.0–34.0)
MCHC: 32.8 g/dL (ref 32.0–36.0)
MCV: 85.3 fL (ref 80.0–100.0)
Monocytes Absolute: 0.3 10*3/uL (ref 0.2–0.9)
Monocytes Relative: 3 %
NEUTROS ABS: 7.9 10*3/uL — AB (ref 1.4–6.5)
NEUTROS PCT: 83 %
Platelets: 217 10*3/uL (ref 150–440)
RBC: 4.73 MIL/uL (ref 3.80–5.20)
RDW: 15.7 % — ABNORMAL HIGH (ref 11.5–14.5)
WBC: 9.5 10*3/uL (ref 3.6–11.0)

## 2015-11-25 LAB — GLUCOSE, CAPILLARY
Glucose-Capillary: 235 mg/dL — ABNORMAL HIGH (ref 65–99)
Glucose-Capillary: 116 mg/dL — ABNORMAL HIGH (ref 65–99)
Glucose-Capillary: 263 mg/dL — ABNORMAL HIGH (ref 65–99)

## 2015-11-25 LAB — TSH: TSH: 1.512 u[IU]/mL (ref 0.350–4.500)

## 2015-11-25 LAB — MRSA PCR SCREENING: MRSA by PCR: NEGATIVE

## 2015-11-25 LAB — BRAIN NATRIURETIC PEPTIDE: B Natriuretic Peptide: 218 pg/mL — ABNORMAL HIGH (ref 0.0–100.0)

## 2015-11-25 MED ORDER — NITROGLYCERIN 0.4 MG SL SUBL: 0.4000 mg | SUBLINGUAL_TABLET | SUBLINGUAL | Status: DC | PRN

## 2015-11-25 MED ORDER — CARISOPRODOL 350 MG PO TABS: 350.0000 mg | ORAL_TABLET | Freq: Every evening | ORAL | Status: DC | PRN

## 2015-11-25 MED ORDER — IPRATROPIUM-ALBUTEROL 0.5-2.5 (3) MG/3ML IN SOLN: 3.0000 mL | Freq: Four times a day (QID) | RESPIRATORY_TRACT | Status: DC | PRN

## 2015-11-25 MED ORDER — AMLODIPINE BESYLATE 10 MG PO TABS
10.0000 mg | ORAL_TABLET | Freq: Every day | ORAL | Status: DC
  Administered 2015-11-25 – 2015-11-26 (×2): 10 mg via ORAL
  Filled 2015-11-25 (×2): qty 1

## 2015-11-25 MED ORDER — DILTIAZEM HCL 60 MG PO TABS
60.0000 mg | ORAL_TABLET | Freq: Four times a day (QID) | ORAL | Status: DC
  Administered 2015-11-26: 60 mg via ORAL
  Filled 2015-11-25 (×2): qty 1

## 2015-11-25 MED ORDER — MECLIZINE HCL 12.5 MG PO TABS: 25.0000 mg | ORAL_TABLET | Freq: Three times a day (TID) | ORAL | Status: DC | PRN

## 2015-11-25 MED ORDER — DILTIAZEM HCL 100 MG IV SOLR
5.0000 mg/h | Freq: Once | INTRAVENOUS | Status: AC
  Administered 2015-11-25: 5 mg/h via INTRAVENOUS
  Filled 2015-11-25: qty 100

## 2015-11-25 MED ORDER — ACETAMINOPHEN 650 MG RE SUPP: 650.0000 mg | Freq: Four times a day (QID) | RECTAL | Status: DC | PRN

## 2015-11-25 MED ORDER — ASPIRIN 81 MG PO CHEW
CHEWABLE_TABLET | ORAL | Status: AC
  Filled 2015-11-25: qty 4

## 2015-11-25 MED ORDER — INSULIN ASPART 100 UNIT/ML ~~LOC~~ SOLN
0.0000 [IU] | Freq: Three times a day (TID) | SUBCUTANEOUS | Status: DC
Start: 2015-11-25 — End: 2015-11-26
  Administered 2015-11-25: 3 [IU] via SUBCUTANEOUS
  Administered 2015-11-26: 2 [IU] via SUBCUTANEOUS
  Filled 2015-11-25: qty 2
  Filled 2015-11-25: qty 3

## 2015-11-25 MED ORDER — INSULIN ASPART 100 UNIT/ML ~~LOC~~ SOLN
0.0000 [IU] | Freq: Every day | SUBCUTANEOUS | Status: DC
  Administered 2015-11-25: 3 [IU] via SUBCUTANEOUS
  Filled 2015-11-25: qty 3

## 2015-11-25 MED ORDER — ATORVASTATIN CALCIUM 20 MG PO TABS
80.0000 mg | ORAL_TABLET | Freq: Every day | ORAL | Status: DC
  Administered 2015-11-25: 80 mg via ORAL
  Filled 2015-11-25: qty 4

## 2015-11-25 MED ORDER — RIVAROXABAN 20 MG PO TABS
20.0000 mg | ORAL_TABLET | Freq: Every day | ORAL | Status: DC
  Administered 2015-11-25: 20 mg via ORAL
  Filled 2015-11-25: qty 2

## 2015-11-25 MED ORDER — LISINOPRIL 20 MG PO TABS
20.0000 mg | ORAL_TABLET | Freq: Every day | ORAL | Status: DC
  Administered 2015-11-25 – 2015-11-26 (×2): 20 mg via ORAL
  Filled 2015-11-25 (×2): qty 1

## 2015-11-25 MED ORDER — ALBUTEROL SULFATE 1.25 MG/3ML IN NEBU
1.2500 mg | INHALATION_SOLUTION | RESPIRATORY_TRACT | Status: DC | PRN
Start: 2015-11-25 — End: 2015-11-25

## 2015-11-25 MED ORDER — ONDANSETRON HCL 4 MG PO TABS: 4.0000 mg | ORAL_TABLET | Freq: Three times a day (TID) | ORAL | Status: DC | PRN

## 2015-11-25 MED ORDER — PANTOPRAZOLE SODIUM 40 MG PO TBEC
40.0000 mg | DELAYED_RELEASE_TABLET | Freq: Every day | ORAL | Status: DC
  Administered 2015-11-25 – 2015-11-26 (×2): 40 mg via ORAL
  Filled 2015-11-25 (×2): qty 1

## 2015-11-25 MED ORDER — ACETAMINOPHEN 325 MG PO TABS
650.0000 mg | ORAL_TABLET | Freq: Four times a day (QID) | ORAL | Status: DC | PRN
Start: 2015-11-25 — End: 2015-11-26

## 2015-11-25 MED ORDER — SODIUM CHLORIDE 0.9% FLUSH
3.0000 mL | Freq: Two times a day (BID) | INTRAVENOUS | Status: DC
  Administered 2015-11-25 – 2015-11-26 (×3): 3 mL via INTRAVENOUS

## 2015-11-25 MED ORDER — ONDANSETRON HCL 4 MG/2ML IJ SOLN: 4.0000 mg | Freq: Four times a day (QID) | INTRAMUSCULAR | Status: DC | PRN

## 2015-11-25 MED ORDER — FUROSEMIDE 40 MG PO TABS
40.0000 mg | ORAL_TABLET | Freq: Every day | ORAL | Status: DC
  Administered 2015-11-25 – 2015-11-26 (×2): 40 mg via ORAL
  Filled 2015-11-25 (×2): qty 1

## 2015-11-25 MED ORDER — ONDANSETRON HCL 4 MG PO TABS: 4.0000 mg | ORAL_TABLET | Freq: Four times a day (QID) | ORAL | Status: DC | PRN

## 2015-11-25 MED ORDER — DILTIAZEM HCL 25 MG/5ML IV SOLN
10.0000 mg | Freq: Once | INTRAVENOUS | Status: AC
  Administered 2015-11-25: 10 mg via INTRAVENOUS
  Filled 2015-11-25: qty 5

## 2015-11-25 MED ORDER — METOPROLOL TARTRATE 25 MG PO TABS
25.0000 mg | ORAL_TABLET | Freq: Four times a day (QID) | ORAL | Status: DC
  Administered 2015-11-25 – 2015-11-26 (×2): 25 mg via ORAL
  Filled 2015-11-25 (×2): qty 1

## 2015-11-25 MED ORDER — ASPIRIN 81 MG PO CHEW: 324.0000 mg | CHEWABLE_TABLET | Freq: Once | ORAL | Status: DC

## 2015-11-25 NOTE — ED Notes (Signed)
Pt resting in bed, pt eating a [place] sandwich, pt updated on plan of care of admission

## 2015-11-25 NOTE — H&P (Signed)
[employer] [facility] [place] [facility]   PATIENT NAME: Robin [name]    MR#:  [id number]  DATE OF BIRTH:  24-Mar-1940  DATE OF ADMISSION:  11/25/2015  PRIMARY CARE PHYSICIAN: Leanna Sato, MD   REQUESTING/REFERRING PHYSICIAN: Dr. Ileana Roup  CHIEF COMPLAINT:   Chief Complaint  Patient presents with  . Chest Pain    HISTORY OF PRESENT ILLNESS:  Robin [name]  is a 75 y.o. female with a known history of Fibrillation, history of diastolic CHF, anxiety, diabetes, hypertension, hyperlipidemia who presented to the hospital due to palpitations and chest pain and noted to be in atrial fibrillation with rapid ventricular response. Patient says she woke up around 4:00 this morning and she felt like she was shaking and had palpitations. She came to the ER for further evaluation. Patient was noted to be in atrial fibrillation with rapid ventricular response, she was given some IV Cardizem for rate control which did not improve and therefore was placed on a Cardizem drip. Hospitalist services were contacted further treatment and evaluation. Patient does admit to chest pain which is located in the center of her chest nonradiating and not associated with any shortness of breath nausea vomiting. She does admit to dizziness but which is chronic for the patient. She denies any other associated symptoms presently.  PAST MEDICAL HISTORY:   Past Medical History:  Diagnosis Date  . Afib (HCC)   . Anxiety   . CHF (congestive heart failure) (HCC)   . Coronary artery disease   . Diabetes mellitus without complication (HCC)   . Hyperlipidemia   . Hypertension     PAST SURGICAL HISTORY:   Past Surgical History:  Procedure Laterality Date  . NO PAST SURGERIES      SOCIAL HISTORY:   Social History  Substance Use Topics  . Smoking status: Never Smoker  . Smokeless tobacco: Never Used  . Alcohol use No    FAMILY HISTORY:   Family History  Problem Relation Age of Onset  .  Hypertension Mother   . Heart failure Mother   . Heart disease Father     DRUG ALLERGIES:  No Known Allergies  REVIEW OF SYSTEMS:   Review of Systems  Constitutional: Negative for fever and weight loss.  HENT: Negative for congestion, nosebleeds and tinnitus.   Eyes: Negative for blurred vision, double vision and redness.  Respiratory: Negative for cough, hemoptysis and shortness of breath.   Cardiovascular: Positive for chest pain and palpitations. Negative for orthopnea, leg swelling and PND.  Gastrointestinal: Negative for abdominal pain, diarrhea, melena, nausea and vomiting.  Genitourinary: Negative for dysuria, hematuria and urgency.  Musculoskeletal: Negative for falls and joint pain.  Neurological: Positive for dizziness. Negative for tingling, sensory change, focal weakness, seizures, weakness and headaches.  Endo/Heme/Allergies: Negative for polydipsia. Does not bruise/bleed easily.  Psychiatric/Behavioral: Negative for depression and memory loss. The patient is not nervous/anxious.     MEDICATIONS AT HOME:   Prior to Admission medications   Medication Sig Start Date End Date Taking? Authorizing Provider  albuterol (ACCUNEB) 1.25 MG/3ML nebulizer solution Take 1.25 mg by nebulization every 4 (four) hours as needed for wheezing or shortness of breath.    Yes Historical Provider, MD  albuterol-ipratropium (COMBIVENT) 18-103 MCG/ACT inhaler Inhale 2 puffs into the lungs 4 (four) times daily as needed for wheezing or shortness of breath.   Yes Historical Provider, MD  amLODipine (NORVASC) 10 MG tablet Take 1 tablet (10 mg total) by mouth daily. 09/15/15  Yes Auburn Bilberry, MD  atorvastatin (LIPITOR) 80 MG tablet Take 1 tablet (80 mg total) by mouth daily. Patient taking differently: Take 80 mg by mouth at bedtime.  09/15/15  Yes Auburn Bilberry, MD  carisoprodol (SOMA) 350 MG tablet Take 1 tablet (350 mg total) by mouth at bedtime as needed for muscle spasms. 11/06/15  Yes Enid Baas, MD  lisinopril (PRINIVIL,ZESTRIL) 20 MG tablet Take 1 tablet (20 mg total) by mouth daily. 09/15/15  Yes Auburn Bilberry, MD  meclizine (ANTIVERT) 25 MG tablet Take 1 tablet (25 mg total) by mouth 3 (three) times daily as needed for dizziness. 11/06/15  Yes Enid Baas, MD  metFORMIN (GLUCOPHAGE) 500 MG tablet Take 1 tablet (500 mg total) by mouth 2 (two) times daily with a meal. Patient taking differently: Take 500 mg by mouth at bedtime.  09/15/15  Yes Auburn Bilberry, MD  metoprolol succinate (TOPROL-XL) 25 MG 24 hr tablet Take 1 tablet (25 mg total) by mouth daily. 09/15/15  Yes Auburn Bilberry, MD  nitroGLYCERIN (NITROSTAT) 0.4 MG SL tablet Place 0.4 mg under the tongue every 5 (five) minutes as needed for chest pain.    Yes Historical Provider, MD  ondansetron (ZOFRAN ODT) 4 MG disintegrating tablet Take 1 tablet (4 mg total) by mouth every 8 (eight) hours as needed for nausea or vomiting. 11/06/15  Yes Enid Baas, MD  pantoprazole (PROTONIX) 40 MG tablet Take 1 tablet (40 mg total) by mouth daily. Patient taking differently: Take 40 mg by mouth daily as needed. For acid reflux 11/06/15  Yes Enid Baas, MD  rivaroxaban (XARELTO) 20 MG TABS tablet Take 1 tablet (20 mg total) by mouth daily. 09/15/15  Yes Auburn Bilberry, MD  diltiazem (CARDIZEM CD) 180 MG 24 hr capsule Take 1 capsule (180 mg total) by mouth daily. Patient not taking: Reported on 11/25/2015 09/15/15   Auburn Bilberry, MD  furosemide (LASIX) 20 MG tablet Take 1 tablet (20 mg total) by mouth daily. Patient not taking: Reported on 11/25/2015 09/25/15   Irean Hong, MD      VITAL SIGNS:  Blood pressure 113/68, pulse (!) 130, temperature 97.9 F (36.6 C), temperature source Oral, resp. rate 15, height 5\' 4"  (1.626 m), weight 74.8 kg (165 lb), SpO2 96 %.  PHYSICAL EXAMINATION:  Physical Exam  GENERAL:  75 y.o.-year-old patient lying in the bed in no acute distress.  EYES: Pupils equal, round, reactive to light and  accommodation. No scleral icterus. Extraocular muscles intact.  HEENT: Head atraumatic, normocephalic. Oropharynx and nasopharynx clear. No oropharyngeal erythema, moist oral mucosa  NECK:  Supple, no jugular venous distention. No thyroid enlargement, no tenderness.  LUNGS: Normal breath sounds bilaterally, no wheezing, rales, rhonchi. No use of accessory muscles of respiration.  CARDIOVASCULAR: S1, S2 Irregular. No murmurs, rubs, gallops, clicks.  ABDOMEN: Soft, nontender, nondistended. Bowel sounds present. No organomegaly or mass.  EXTREMITIES: + 1 edema L>R, No cyanosis, or clubbing. + 2 pedal & radial pulses b/l.   NEUROLOGIC: Cranial nerves II through XII are intact. No focal Motor or sensory deficits appreciated b/l PSYCHIATRIC: The patient is alert and oriented x 3. Good affect.  SKIN: No obvious rash, lesion, or ulcer.   LABORATORY PANEL:   CBC  Recent Labs Lab 11/25/15 0659  WBC 9.5  HGB 13.2  HCT 40.4  PLT 217   ------------------------------------------------------------------------------------------------------------------  Chemistries   Recent Labs Lab 11/25/15 0659  NA 141  K 3.9  CL 109  CO2 23  GLUCOSE 264*  BUN 19  CREATININE 0.60  CALCIUM 9.0  AST 26  ALT 30  ALKPHOS 109  BILITOT 0.4   ------------------------------------------------------------------------------------------------------------------  Cardiac Enzymes  Recent Labs Lab 11/25/15 0659  TROPONINI <0.03   ------------------------------------------------------------------------------------------------------------------  RADIOLOGY:  Dg Chest Port 1 View  Result Date: 11/25/2015 CLINICAL DATA:  Chest pain EXAM: PORTABLE CHEST 1 VIEW COMPARISON:  11/04/2015 FINDINGS: Chronic cardiopericardial enlargement. Status post CABG and coronary stenting. There is no edema, consolidation, effusion, or pneumothorax. No acute osseous finding. IMPRESSION: Stable.  No evidence of acute disease.  Electronically Signed   By: Marnee SpringJonathon  Watts M.D.   On: 11/25/2015 07:36     IMPRESSION AND PLAN:   75 yo female with past medical history of atrial fibrillation, hypertension, hyperlipidemia, history of diastolic CHF, diabetes who presented to the hospital due to palpitations and chest pain.  1. Atrial fibrillation with rapid ventricular response-continue IV Cardizem drip. -I will place the patient on oral metoprolol to wean her off the drip I will get a cardiology consult. -Continue Xarelto.  2. Chest pain-atypical likely related to the A. fib with RVR. First set of cardiac markers are negative. -Observe on telemetry, cycle cardiac markers. Continue sublingual nitroglycerin.  3. Diabetes type 2 without complication-hold metformin -Continue sliding scale insulin for now.  4. Hyperlipidemia-continue atorvastatin.  5. Essential hypertension-continue lisinopril, Norvasc, metoprolol.  6. GERD-continue Protonix.  7. History of diastolic CHF-clinically patient does not appear to be in congestive heart failure. Continue Lasix, metoprolol, lisinopril.    All the records are reviewed and case discussed with ED provider. Management plans discussed with the patient, family and they are in agreement.  CODE STATUS: Full  TOTAL TIME TAKING CARE OF THIS PATIENT: 50 minutes.    Houston SirenSAINANI,VIVEK J M.D on 11/25/2015 at 9:22 AM  Between 7am to 6pm - Pager - [phone]  After 6pm go to [email or web address] - password [email or web address] [facility]  [facility]  Office  [phone]  CC: Primary care physician; Leanna SatoMILES,LINDA M, MD

## 2015-11-25 NOTE — ED Notes (Signed)
Pt resting in bed, resp even and unlabored 

## 2015-11-25 NOTE — ED Provider Notes (Signed)
-----------------------------------------   8:42 AM on 11/25/2015 -----------------------------------------  Patient with a history of A. fib with RVR in the past presents with A. fib she did have some chest discomfort. She was started on a nitroglycerin bolus prior to my arrival which did not greatly reduce her heart rate. She is not, however, at this time having chest pain. Work is reassuring. He did though start her diltiazem drip as has happened multiple times in the past to help with rate control which is coming down gradually. Given the associated chest discomfort even though was atypical and her A. fib with RVR this morning at the patient would benefit from further observation. Especially since we were obliged to started a drip to get her rate under control.CRITICAL CARE Performed by: Jeanmarie Plant   Total critical care time: 38 minutes  Critical care time was exclusive of separately billable procedures and treating other patients.  Critical care was necessary to treat or prevent imminent or life-threatening deterioration.  Critical care was time spent personally by me on the following activities: development of treatment plan with patient and/or surrogate as well as nursing, discussions with consultants, evaluation of patient's response to treatment, examination of patient, obtaining history from patient or surrogate, ordering and performing treatments and interventions, ordering and review of laboratory studies, ordering and review of radiographic studies, pulse oximetry and re-evaluation of patient's condition.    Jeanmarie Plant, MD 11/25/15 [phone]

## 2015-11-25 NOTE — ED Notes (Signed)
Pt assisted to bathroom, walked back to bed, pt given call bell and instructed to hit call ball if she has to use the bathroom

## 2015-11-25 NOTE — ED Provider Notes (Signed)
[facility] Emergency Department Provider Note   ____________________________________________   First MD Initiated Contact with Patient 11/25/15 [phone]     (approximate)  I have reviewed the triage vital signs and the nursing notes.   HISTORY  Chief Complaint Chest Pain    HPI Robin [name] is a 75 y.o. female who presents to the ED from home via EMS with chief complaint of chest pain. Patient has a history of atrial fibrillation on Xarelto, CAD, hypertension, CHF who was awakened from sleep at approximately 4 AM with a sensation of chest burning. Denies associated diaphoresis, shortness of breath, nausea or vomiting. Denies recent fever, chills, cough, congestion, abdominal pain, diarrhea. Denies recent travel or trauma. Nothing makes her symptoms better or worse.   Past Medical History:  Diagnosis Date  . Afib (HCC)   . Anxiety   . CHF (congestive heart failure) (HCC)   . Coronary artery disease   . Diabetes mellitus without complication (HCC)   . Hyperlipidemia   . Hypertension     Patient Active Problem List   Diagnosis Date Noted  . Chest pain 11/04/2015  . HTN (hypertension) 11/04/2015  . HLD (hyperlipidemia) 11/04/2015  . CAD (coronary artery disease) 11/04/2015  . Chronic systolic CHF (congestive heart failure) (HCC) 11/04/2015  . Diabetes (HCC) 11/04/2015  . Atrial fibrillation with RVR (HCC) 09/13/2015    Past Surgical History:  Procedure Laterality Date  . NO PAST SURGERIES      Prior to Admission medications   Medication Sig Start Date End Date Taking? Authorizing Provider  albuterol (ACCUNEB) 1.25 MG/3ML nebulizer solution Take 1.25 mg by nebulization every 4 (four) hours as needed for wheezing or shortness of breath.     Historical Provider, MD  albuterol-ipratropium (COMBIVENT) 18-103 MCG/ACT inhaler Inhale 2 puffs into the lungs 4 (four) times daily as needed for wheezing or shortness of breath.    Historical Provider, MD    amLODipine (NORVASC) 10 MG tablet Take 1 tablet (10 mg total) by mouth daily. 09/15/15   Auburn BilberryShreyang Patel, MD  atorvastatin (LIPITOR) 80 MG tablet Take 1 tablet (80 mg total) by mouth daily. 09/15/15   Auburn BilberryShreyang Patel, MD  carisoprodol (SOMA) 350 MG tablet Take 1 tablet (350 mg total) by mouth at bedtime as needed for muscle spasms. 11/06/15   Enid Baasadhika Kalisetti, MD  diltiazem (CARDIZEM CD) 180 MG 24 hr capsule Take 1 capsule (180 mg total) by mouth daily. 09/15/15   Auburn BilberryShreyang Patel, MD  furosemide (LASIX) 20 MG tablet Take 1 tablet (20 mg total) by mouth daily. 09/25/15   Irean HongJade J Dornell Grasmick, MD  glipiZIDE (GLUCOTROL) 5 MG tablet Take 5 mg by mouth daily before breakfast.    Historical Provider, MD  lisinopril (PRINIVIL,ZESTRIL) 20 MG tablet Take 1 tablet (20 mg total) by mouth daily. 09/15/15   Auburn BilberryShreyang Patel, MD  meclizine (ANTIVERT) 25 MG tablet Take 1 tablet (25 mg total) by mouth 3 (three) times daily as needed for dizziness. 11/06/15   Enid Baasadhika Kalisetti, MD  metFORMIN (GLUCOPHAGE) 500 MG tablet Take 1 tablet (500 mg total) by mouth 2 (two) times daily with a meal. 09/15/15   Auburn BilberryShreyang Patel, MD  metoprolol succinate (TOPROL-XL) 25 MG 24 hr tablet Take 1 tablet (25 mg total) by mouth daily. 09/15/15   Auburn BilberryShreyang Patel, MD  nitroGLYCERIN (NITROSTAT) 0.4 MG SL tablet Place 0.4 mg under the tongue every 5 (five) minutes as needed for chest pain.     Historical Provider, MD  ondansetron (ZOFRAN ODT)  4 MG disintegrating tablet Take 1 tablet (4 mg total) by mouth every 8 (eight) hours as needed for nausea or vomiting. 11/06/15   Enid Baas, MD  pantoprazole (PROTONIX) 40 MG tablet Take 1 tablet (40 mg total) by mouth daily. 11/06/15   Enid Baas, MD  rivaroxaban (XARELTO) 20 MG TABS tablet Take 1 tablet (20 mg total) by mouth daily. 09/15/15   Auburn Bilberry, MD    Allergies Review of patient's allergies indicates no known allergies.  Family History  Problem Relation Age of Onset  . Hypertension Mother      Social History Social History  Substance Use Topics  . Smoking status: Never Smoker  . Smokeless tobacco: Never Used  . Alcohol use No    Review of Systems  Constitutional: No fever/chills. Eyes: No visual changes. ENT: No sore throat. Cardiovascular: Positive for chest pain. Respiratory: Denies shortness of breath. Gastrointestinal: No abdominal pain.  No nausea, no vomiting.  No diarrhea.  No constipation. Genitourinary: Negative for dysuria. Musculoskeletal: Negative for back pain. Skin: Negative for rash. Neurological: Negative for headaches, focal weakness or numbness.  10-point ROS otherwise negative.  ____________________________________________   PHYSICAL EXAM:  VITAL SIGNS: ED Triage Vitals [11/25/15 0636]  Enc Vitals Group     BP      Pulse      Resp      Temp      Temp src      SpO2 99 %     Weight      Height      Head Circumference      Peak Flow      Pain Score      Pain Loc      Pain Edu?      Excl. in GC?     Constitutional: Alert and oriented. Well appearing and in mild acute distress. Eyes: Conjunctivae are normal. PERRL. EOMI. Head: Atraumatic. Nose: No congestion/rhinnorhea. Mouth/Throat: Mucous membranes are moist.  Oropharynx non-erythematous. Neck: No stridor.   Cardiovascular: Tachycardic rate, irregular rhythm. Grossly normal heart sounds.  Good peripheral circulation. Respiratory: Normal respiratory effort.  No retractions. Lungs CTAB. Gastrointestinal: Soft and nontender. No distention. No abdominal bruits. No CVA tenderness. Musculoskeletal: No lower extremity tenderness. BLE 1+ edema.  No joint effusions. Neurologic:  Normal speech and language. No gross focal neurologic deficits are appreciated.  Skin:  Skin is warm, dry and intact. No rash noted. Psychiatric: Mood and affect are normal. Speech and behavior are normal.  ____________________________________________   LABS (all labs ordered are listed, but only abnormal  results are displayed)  Labs Reviewed  CBC WITH DIFFERENTIAL/PLATELET  COMPREHENSIVE METABOLIC PANEL  BRAIN NATRIURETIC PEPTIDE  TROPONIN I  TSH   ____________________________________________  EKG  ED ECG REPORT I, Carrah Eppolito J, the attending physician, personally viewed and interpreted this ECG.   Date: 11/25/2015  EKG Time: 0644  Rate: 129  Rhythm: atrial fibrillation, rate 129  Axis: LAD  Intervals:nonspecific intraventricular conduction delay, prolonged QTC  ST&T Change: Nonspecific  ____________________________________________  RADIOLOGY  Pending ____________________________________________   PROCEDURES  Procedure(s) performed: None  Procedures  Critical Care performed: No  ____________________________________________   INITIAL IMPRESSION / ASSESSMENT AND PLAN / ED COURSE  Pertinent labs & imaging results that were available during my care of the patient were reviewed by me and considered in my medical decision making (see chart for details).  75 year old female with a history of CAD, CHF, atrial fibrillation on Xarelto who presents with central chest pain and atrial  fibrillation with rapid ventricular rate. Will initiate IV Cardizem bolus, obtain screening labwork, CXR. Anticipate hospital admission. Care transferred to Dr. Alphonzo Lemmings.  Clinical Course     ____________________________________________   FINAL CLINICAL IMPRESSION(S) / ED DIAGNOSES  Final diagnoses:  Chest pain, unspecified chest pain type  Paroxysmal atrial fibrillation (HCC)      NEW MEDICATIONS STARTED DURING THIS VISIT:  New Prescriptions   No medications on file     Note:  This document was prepared using Dragon voice recognition software and may include unintentional dictation errors.    Irean Hong, MD 11/25/15 [phone]

## 2015-11-25 NOTE — Consult Note (Signed)
[facility] Cardiology  CARDIOLOGY CONSULT NOTE  Patient ID: Robin [name] MRN: [id number] DOB/AGE: 1940/03/17 75 y.o.  Admit date: 11/25/2015 Referring Physician Cherlynn Kaiser Primary Physician [facility] Primary Cardiologist Fath Reason for Consultation Atrial fibrillation  HPI: 75 year old female referred for evaluation of atrial fibrillation. The patient has known history of coronary artery disease status post CABG. She has a history of paroxysmal atrial fibrillation, currently on metoprolol and diltiazem. The patient was used at health though she woke up at 4 AM with palpitations. She presented to [facility] emergency room where she was in atrial fibrillation with a rapid ventricular rate. She was treated with Cardizem bolus and started on a drip and admitted to the ICU. She remains in atrial fibrillation with a heart rate of 90-110 bpm. Admission labs were notable for negative troponin.  Review of systems complete and found to be negative unless listed above     Past Medical History:  Diagnosis Date  . Afib (HCC)   . Anxiety   . CHF (congestive heart failure) (HCC)   . Coronary artery disease   . Diabetes mellitus without complication (HCC)   . Hyperlipidemia   . Hypertension     Past Surgical History:  Procedure Laterality Date  . NO PAST SURGERIES      Prescriptions Prior to Admission  Medication Sig Dispense Refill Last Dose  . albuterol (ACCUNEB) 1.25 MG/3ML nebulizer solution Take 1.25 mg by nebulization every 4 (four) hours as needed for wheezing or shortness of breath.    PRN at PRN  . albuterol-ipratropium (COMBIVENT) 18-103 MCG/ACT inhaler Inhale 2 puffs into the lungs 4 (four) times daily as needed for wheezing or shortness of breath.   PRN at PRN  . amLODipine (NORVASC) 10 MG tablet Take 1 tablet (10 mg total) by mouth daily. 30 tablet 2 11/24/2015 at am  . atorvastatin (LIPITOR) 80 MG tablet Take 1 tablet (80 mg total) by mouth daily. (Patient taking differently: Take 80 mg by mouth at  bedtime. ) 30 tablet 2 11/24/2015 at pm  . carisoprodol (SOMA) 350 MG tablet Take 1 tablet (350 mg total) by mouth at bedtime as needed for muscle spasms. 40 tablet 0 PRN at PRN  . lisinopril (PRINIVIL,ZESTRIL) 20 MG tablet Take 1 tablet (20 mg total) by mouth daily. 30 tablet 0 11/24/2015 at am  . meclizine (ANTIVERT) 25 MG tablet Take 1 tablet (25 mg total) by mouth 3 (three) times daily as needed for dizziness. 30 tablet 0 PRN at PRN  . metFORMIN (GLUCOPHAGE) 500 MG tablet Take 1 tablet (500 mg total) by mouth 2 (two) times daily with a meal. (Patient taking differently: Take 500 mg by mouth at bedtime. )   11/24/2015 at pm  . metoprolol succinate (TOPROL-XL) 25 MG 24 hr tablet Take 1 tablet (25 mg total) by mouth daily. 30 tablet 2 11/24/2015 at 0830  . nitroGLYCERIN (NITROSTAT) 0.4 MG SL tablet Place 0.4 mg under the tongue every 5 (five) minutes as needed for chest pain.    PRN at PRN  . ondansetron (ZOFRAN ODT) 4 MG disintegrating tablet Take 1 tablet (4 mg total) by mouth every 8 (eight) hours as needed for nausea or vomiting. 20 tablet 0 PRN at PRN  . pantoprazole (PROTONIX) 40 MG tablet Take 1 tablet (40 mg total) by mouth daily. (Patient taking differently: Take 40 mg by mouth daily as needed. For acid reflux) 30 tablet 2 PRN at PRN  . rivaroxaban (XARELTO) 20 MG TABS tablet Take 1 tablet (20 mg  total) by mouth daily. 30 tablet 2 11/24/2015 at 0830  . diltiazem (CARDIZEM CD) 180 MG 24 hr capsule Take 1 capsule (180 mg total) by mouth daily. (Patient not taking: Reported on 11/25/2015) 30 capsule 2 Not Taking at Unknown time  . furosemide (LASIX) 20 MG tablet Take 1 tablet (20 mg total) by mouth daily. (Patient not taking: Reported on 11/25/2015) 3 tablet 0 Not Taking at Unknown time   Social History   Social History  . Marital status: Single    Spouse name: N/A  . Number of children: N/A  . Years of education: N/A   Occupational History  . Not on file.   Social History Main Topics  .  Smoking status: Never Smoker  . Smokeless tobacco: Never Used  . Alcohol use No  . Drug use: No  . Sexual activity: No   Other Topics Concern  . Not on file   Social History Narrative  . No narrative on file    Family History  Problem Relation Age of Onset  . Hypertension Mother   . Heart failure Mother   . Heart disease Father       Review of systems complete and found to be negative unless listed above      PHYSICAL EXAM  General: Well developed, well nourished, in no acute distress HEENT:  Normocephalic and atramatic Neck:  No JVD.  Lungs: Clear bilaterally to auscultation and percussion. Heart: HRRR . Normal S1 and S2 without gallops or murmurs.  Abdomen: Bowel sounds are positive, abdomen soft and non-tender  Msk:  Back normal, normal gait. Normal strength and tone for age. Extremities: No clubbing, cyanosis or edema.   Neuro: Alert and oriented X 3. Psych:  Good affect, responds appropriately  Labs:   Lab Results  Component Value Date   WBC 9.5 11/25/2015   HGB 13.2 11/25/2015   HCT 40.4 11/25/2015   MCV 85.3 11/25/2015   PLT 217 11/25/2015    Recent Labs Lab 11/25/15 0659  NA 141  K 3.9  CL 109  CO2 23  BUN 19  CREATININE 0.60  CALCIUM 9.0  PROT 7.2  BILITOT 0.4  ALKPHOS 109  ALT 30  AST 26  GLUCOSE 264*   Lab Results  Component Value Date   CKTOTAL 79 02/11/2014   CKMB 1.3 02/11/2014   TROPONINI <0.03 11/25/2015    Lab Results  Component Value Date   CHOL 229 (H) 02/11/2014   CHOL 254 (H) 09/26/2012   CHOL 211 (H) 07/06/2012   Lab Results  Component Value Date   HDL 49 02/11/2014   HDL 55 09/26/2012   HDL 49 07/06/2012   Lab Results  Component Value Date   LDLCALC 156 (H) 02/11/2014   LDLCALC 166 (H) 09/26/2012   LDLCALC 128 (H) 07/06/2012   Lab Results  Component Value Date   TRIG 120 02/11/2014   TRIG 166 09/26/2012   TRIG 170 07/06/2012   No results found for: CHOLHDL No results found for: LDLDIRECT     Radiology: Dg Chest 2 View  Result Date: 11/04/2015 CLINICAL DATA:  Pt reports around 2 pm she was awakened from sleep with tight, burning pain to her mid sternal chest . Pt reports +nausea, +shortness of breath with the pain but denies diaphoresis. Pt talking in full and complete sentences with .*comment was truncated* EXAM: CHEST  2 VIEW COMPARISON:  09/13/2015 FINDINGS: Sternotomy wires overlie normal cardiac silhouette. No effusion, infiltrate pneumothorax. Lungs mildly hyperinflated. Degenerative osteophytosis  of the thoracic spine. IMPRESSION: Hyperinflated lungs.  No acute findings. Electronically Signed   By: Genevive Bi M.D.   On: 11/04/2015 21:10   Dg Chest Port 1 View  Result Date: 11/25/2015 CLINICAL DATA:  Chest pain EXAM: PORTABLE CHEST 1 VIEW COMPARISON:  11/04/2015 FINDINGS: Chronic cardiopericardial enlargement. Status post CABG and coronary stenting. There is no edema, consolidation, effusion, or pneumothorax. No acute osseous finding. IMPRESSION: Stable.  No evidence of acute disease. Electronically Signed   By: Marnee Spring M.D.   On: 11/25/2015 07:36    EKG: Atrial fibrillation with a rapid ventricular rate  ASSESSMENT AND PLAN:   1. Paroxysmal atrial fibrillation, with rapid ventricular rate, improved after Cardizem bolus and drip 2. CAD, status post CABG, currently without chest pain, with negative troponin  Recommendations  1. Continue Cardizem drip, titrate to control heart rate 2. Continue Xarelto for stroke prevention 3. Continue metoprolol tartrate 4. Further recommendations pending patient's initial clinical course  Signed: Zebulin Siegel MD,PhD, [facility] 11/25/2015, 3:44 PM

## 2015-11-25 NOTE — ED Triage Notes (Signed)
Pt presents w/ c/o CP starting at 0400 this morning w/ accompanying sxs, describes pain as burning, states she thought it might be her acid reflux but did not take anything for her pain. Pt in no acute distress at this time. Pt has mentioned to myself and to EMS that her daughter is leaving tomorrow to return to [place] where she resides. Pt is less than enthusiastic about this. Pt c/o chest pain at this time, describes it as burning. Pt able to speak in complete sentences.

## 2015-11-26 LAB — BASIC METABOLIC PANEL
ANION GAP: 7 (ref 5–15)
BUN: 20 mg/dL (ref 6–20)
CHLORIDE: 108 mmol/L (ref 101–111)
CO2: 28 mmol/L (ref 22–32)
Calcium: 8.7 mg/dL — ABNORMAL LOW (ref 8.9–10.3)
Creatinine, Ser: 0.68 mg/dL (ref 0.44–1.00)
GFR calc Af Amer: >60 mL/min (ref >60)
GFR calc non Af Amer: >60 mL/min (ref >60)
GLUCOSE: 154 mg/dL — AB (ref 65–99)
POTASSIUM: 3.3 mmol/L — AB (ref 3.5–5.1)
Sodium: 143 mmol/L (ref 135–145)

## 2015-11-26 LAB — GLUCOSE, CAPILLARY
Glucose-Capillary: 193 mg/dL — ABNORMAL HIGH (ref 65–99)
Glucose-Capillary: 194 mg/dL — ABNORMAL HIGH (ref 65–99)

## 2015-11-26 LAB — CBC
HEMATOCRIT: 36.9 % (ref 35.0–47.0)
HEMOGLOBIN: 12.2 g/dL (ref 12.0–16.0)
MCH: 27.8 pg (ref 26.0–34.0)
MCHC: 33.2 g/dL (ref 32.0–36.0)
MCV: 83.9 fL (ref 80.0–100.0)
Platelets: 239 10*3/uL (ref 150–440)
RBC: 4.4 MIL/uL (ref 3.80–5.20)
RDW: 15.1 % — AB (ref 11.5–14.5)
WBC: 8.8 10*3/uL (ref 3.6–11.0)

## 2015-11-26 MED ORDER — DILTIAZEM HCL ER COATED BEADS 240 MG PO CP24: 240.0000 mg | ORAL_CAPSULE | Freq: Every day | ORAL | 0 refills | Status: DC

## 2015-11-26 MED ORDER — DILTIAZEM HCL ER COATED BEADS 240 MG PO CP24
240.0000 mg | ORAL_CAPSULE | Freq: Every day | ORAL | Status: DC
  Administered 2015-11-26: 240 mg via ORAL
  Filled 2015-11-26: qty 1

## 2015-11-26 MED ORDER — METOPROLOL TARTRATE 50 MG PO TABS
50.0000 mg | ORAL_TABLET | Freq: Two times a day (BID) | ORAL | Status: DC
  Administered 2015-11-26: 50 mg via ORAL
  Filled 2015-11-26: qty 1

## 2015-11-26 MED ORDER — METOPROLOL TARTRATE 50 MG PO TABS: 50.0000 mg | ORAL_TABLET | Freq: Two times a day (BID) | ORAL | 2 refills | Status: DC

## 2015-11-26 NOTE — Discharge Summary (Signed)
[facility]   PATIENT NAME: Robin [name]    MR#:  [id number]  DATE OF BIRTH:  1940-05-24  DATE OF ADMISSION:  11/25/2015 ADMITTING PHYSICIAN: Houston Siren, MD  DATE OF DISCHARGE: 11/26/15  PRIMARY CARE PHYSICIAN: Leanna Sato, MD    ADMISSION DIAGNOSIS:  Paroxysmal atrial fibrillation (HCC) [I48.0] Chest pain, unspecified chest pain type [R07.9]  DISCHARGE DIAGNOSIS:  Paroxysmal atrial fibrillation acute on chronic Chronic anticoagulaltion with xarelto SECONDARY DIAGNOSIS:   Past Medical History:  Diagnosis Date  . Afib (HCC)   . Anxiety   . CHF (congestive heart failure) (HCC)   . Coronary artery disease   . Diabetes mellitus without complication (HCC)   . Hyperlipidemia   . Hypertension     HOSPITAL COURSE:  75 yo female with past medical history of atrial fibrillation, hypertension, hyperlipidemia, history of diastolic CHF, diabetes who presented to the hospital due to palpitations and chest pain.  1. Atrial fibrillation with rapid ventricular response -was on IV Cardizem drip---changed to po CArdizem CD 240 mg daily and metoprolol 50 mg bid  cardiology consult with dr Cassie Freer appreciated -Continue Xarelto.  2. Chest pain-atypical likely related to the A. fib with RVR.  -First set of cardiac markers are negative. Continue sublingual nitroglycerin. -resolved  3. Diabetes type 2 without complication- -cont metformin -Continue sliding scale insulin for now.  4. Hyperlipidemia-continue atorvastatin.  5. Essential hypertension-continue lisinopril, Norvasc, metoprolol.  6. GERD-continue Protonix.  7. History of diastolic CHF-clinically patient does not appear to be in congestive heart failure. Continue Lasix, metoprolol, lisinopril.  Overall stbale D/c home D/ pt and dter   CONSULTS OBTAINED:  Treatment Team:  Marcina Millard, MD  DRUG ALLERGIES:  No Known Allergies  DISCHARGE  MEDICATIONS:   Current Discharge Medication List    START taking these medications   Details  metoprolol (LOPRESSOR) 50 MG tablet Take 1 tablet (50 mg total) by mouth 2 (two) times daily. Qty: 60 tablet, Refills: 2      CONTINUE these medications which have CHANGED   Details  diltiazem (CARDIZEM CD) 240 MG 24 hr capsule Take 1 capsule (240 mg total) by mouth daily. Qty: 60 capsule, Refills: 0      CONTINUE these medications which have NOT CHANGED   Details  albuterol (ACCUNEB) 1.25 MG/3ML nebulizer solution Take 1.25 mg by nebulization every 4 (four) hours as needed for wheezing or shortness of breath.     albuterol-ipratropium (COMBIVENT) 18-103 MCG/ACT inhaler Inhale 2 puffs into the lungs 4 (four) times daily as needed for wheezing or shortness of breath.    atorvastatin (LIPITOR) 80 MG tablet Take 1 tablet (80 mg total) by mouth daily. Qty: 30 tablet, Refills: 2    carisoprodol (SOMA) 350 MG tablet Take 1 tablet (350 mg total) by mouth at bedtime as needed for muscle spasms. Qty: 40 tablet, Refills: 0    lisinopril (PRINIVIL,ZESTRIL) 20 MG tablet Take 1 tablet (20 mg total) by mouth daily. Qty: 30 tablet, Refills: 0    meclizine (ANTIVERT) 25 MG tablet Take 1 tablet (25 mg total) by mouth 3 (three) times daily as needed for dizziness. Qty: 30 tablet, Refills: 0    metFORMIN (GLUCOPHAGE) 500 MG tablet Take 1 tablet (500 mg total) by mouth 2 (two) times daily with a meal.    nitroGLYCERIN (NITROSTAT) 0.4 MG SL tablet Place 0.4 mg under the tongue every 5 (five) minutes as needed for chest pain.     ondansetron (  ZOFRAN ODT) 4 MG disintegrating tablet Take 1 tablet (4 mg total) by mouth every 8 (eight) hours as needed for nausea or vomiting. Qty: 20 tablet, Refills: 0    pantoprazole (PROTONIX) 40 MG tablet Take 1 tablet (40 mg total) by mouth daily. Qty: 30 tablet, Refills: 2    rivaroxaban (XARELTO) 20 MG TABS tablet Take 1 tablet (20 mg total) by mouth daily. Qty: 30  tablet, Refills: 2    furosemide (LASIX) 20 MG tablet Take 1 tablet (20 mg total) by mouth daily. Qty: 3 tablet, Refills: 0      STOP taking these medications     amLODipine (NORVASC) 10 MG tablet      metoprolol succinate (TOPROL-XL) 25 MG 24 hr tablet         If you experience worsening of your admission symptoms, develop shortness of breath, life threatening emergency, suicidal or homicidal thoughts you must seek medical attention immediately by calling 911 or calling your MD immediately  if symptoms less severe.  You Must read complete instructions/literature along with all the possible adverse reactions/side effects for all the Medicines you take and that have been prescribed to you. Take any new Medicines after you have completely understood and accept all the possible adverse reactions/side effects.   Please note  You were cared for by a hospitalist during your hospital stay. If you have any questions about your discharge medications or the care you received while you were in the hospital after you are discharged, you can call the unit and asked to speak with the hospitalist on call if the hospitalist that took care of you is not available. Once you are discharged, your primary care physician will handle any further medical issues. Please note that NO REFILLS for any discharge medications will be authorized once you are discharged, as it is imperative that you return to your primary care physician (or establish a relationship with a primary care physician if you do not have one) for your aftercare needs so that they can reassess your need for medications and monitor your lab values. Today   SUBJECTIVE   Doing well. Wants to go home  VITAL SIGNS:  Blood pressure 133/75, pulse 71, temperature 97.4 F (36.3 C), temperature source Axillary, resp. rate 16, height 5\' 4"  (1.626 m), weight 72.8 kg (160 lb 7.9 oz), SpO2 97 %.  I/O:   Intake/Output Summary (Last 24 hours) at 11/26/15  1100 Last data filed at 11/25/15 2100  Gross per 24 hour  Intake           329.08 ml  Output                0 ml  Net           329.08 ml    PHYSICAL EXAMINATION:  GENERAL:  75 y.o.-year-old patient lying in the bed with no acute distress.  EYES: Pupils equal, round, reactive to light and accommodation. No scleral icterus. Extraocular muscles intact.  HEENT: Head atraumatic, normocephalic. Oropharynx and nasopharynx clear.  NECK:  Supple, no jugular venous distention. No thyroid enlargement, no tenderness.  LUNGS: Normal breath sounds bilaterally, no wheezing, rales,rhonchi or crepitation. No use of accessory muscles of respiration.  CARDIOVASCULAR: S1, S2 normal. No murmurs, rubs, or gallops.  ABDOMEN: Soft, non-tender, non-distended. Bowel sounds present. No organomegaly or mass.  EXTREMITIES: No pedal edema, cyanosis, or clubbing.  NEUROLOGIC: Cranial nerves II through XII are intact. Muscle strength 5/5 in all extremities. Sensation intact. Gait not  checked.  PSYCHIATRIC: The patient is alert and oriented x 3.  SKIN: No obvious rash, lesion, or ulcer.   DATA REVIEW:   CBC   Recent Labs Lab 11/26/15 0500  WBC 8.8  HGB 12.2  HCT 36.9  PLT 239    Chemistries   Recent Labs Lab 11/25/15 0659 11/26/15 0500  NA 141 143  K 3.9 3.3*  CL 109 108  CO2 23 28  GLUCOSE 264* 154*  BUN 19 20  CREATININE 0.60 0.68  CALCIUM 9.0 8.7*  AST 26  --   ALT 30  --   ALKPHOS 109  --   BILITOT 0.4  --     Microbiology Results   Recent Results (from the past 240 hour(s))  MRSA PCR Screening     Status: None   Collection Time: 11/25/15  3:20 PM  Result Value Ref Range Status   MRSA by PCR NEGATIVE NEGATIVE Final    Comment:        The GeneXpert MRSA Assay (FDA approved for NASAL specimens only), is one component of a comprehensive MRSA colonization surveillance program. It is not intended to diagnose MRSA infection nor to guide or monitor treatment for MRSA infections.      RADIOLOGY:  Dg Chest Port 1 View  Result Date: 11/25/2015 CLINICAL DATA:  Chest pain EXAM: PORTABLE CHEST 1 VIEW COMPARISON:  11/04/2015 FINDINGS: Chronic cardiopericardial enlargement. Status post CABG and coronary stenting. There is no edema, consolidation, effusion, or pneumothorax. No acute osseous finding. IMPRESSION: Stable.  No evidence of acute disease. Electronically Signed   By: Marnee Spring M.D.   On: 11/25/2015 07:36     Management plans discussed with the patient, family and they are in agreement.  CODE STATUS:     Code Status Orders        Start     Ordered   11/25/15 1541  Full code  Continuous     11/25/15 1540    Code Status History    Date Active Date Inactive Code Status Order ID Comments User Context   11/05/2015 12:33 AM 11/06/2015  6:05 PM Full Code [id number]  Oralia Manis, MD Inpatient   09/13/2015 10:10 AM 09/15/2015  7:33 PM Full Code [id number]  Altamese Dilling, MD Inpatient      TOTAL TIME TAKING CARE OF THIS PATIENT: 40 minutes.    Journei Thomassen M.D on 11/26/2015 at 11:00 AM  Between 7am to 6pm - Pager - [phone] After 6pm go to [email or web address] - password [email or web address] [facility]  [facility]  Office  [phone]  CC: Primary care physician; Leanna Sato, MD

## 2015-11-26 NOTE — Progress Notes (Signed)
PT OOB to chair, minimal assist, vss

## 2015-11-26 NOTE — Consult Note (Signed)
[facility] Cardiology  SUBJECTIVE: I don't have chest pain   Vitals:   11/26/15 0530 11/26/15 0600 11/26/15 0700 11/26/15 0800  BP:  (!) 116/55 (!) 129/51 132/63  Pulse: (!) 56 65 (!) 57 63  Resp:  13 19 19   Temp:   97.4 F (36.3 C)   TempSrc:   Axillary   SpO2:  97% 97% 98%  Weight:      Height:         Intake/Output Summary (Last 24 hours) at 11/26/15 0935 Last data filed at 11/25/15 2100  Gross per 24 hour  Intake           329.08 ml  Output                0 ml  Net           329.08 ml      PHYSICAL EXAM  General: Well developed, well nourished, in no acute distress HEENT:  Normocephalic and atramatic Neck:  No JVD.  Lungs: Clear bilaterally to auscultation and percussion. Heart: HRRR . Normal S1 and S2 without gallops or murmurs.  Abdomen: Bowel sounds are positive, abdomen soft and non-tender  Msk:  Back normal, normal gait. Normal strength and tone for age. Extremities: No clubbing, cyanosis or edema.   Neuro: Alert and oriented X 3. Psych:  Good affect, responds appropriately   LABS: Basic Metabolic Panel:  Recent Labs  67/70/34 0659 11/26/15 0500  NA 141 143  K 3.9 3.3*  CL 109 108  CO2 23 28  GLUCOSE 264* 154*  BUN 19 20  CREATININE 0.60 0.68  CALCIUM 9.0 8.7*   Liver Function Tests:  Recent Labs  11/25/15 0659  AST 26  ALT 30  ALKPHOS 109  BILITOT 0.4  PROT 7.2  ALBUMIN 3.8   No results for input(s): LIPASE, AMYLASE in the last 72 hours. CBC:  Recent Labs  11/25/15 0659 11/26/15 0500  WBC 9.5 8.8  NEUTROABS 7.9*  --   HGB 13.2 12.2  HCT 40.4 36.9  MCV 85.3 83.9  PLT 217 239   Cardiac Enzymes:  Recent Labs  11/25/15 1207 11/25/15 1619 11/25/15 2052  TROPONINI <0.03 <0.03 <0.03   BNP: Invalid input(s): POCBNP D-Dimer: No results for input(s): DDIMER in the last 72 hours. Hemoglobin A1C: No results for input(s): HGBA1C in the last 72 hours. Fasting Lipid Panel: No results for input(s): CHOL, HDL, LDLCALC, TRIG,  CHOLHDL, LDLDIRECT in the last 72 hours. Thyroid Function Tests:  Recent Labs  11/25/15 0659  TSH 1.512   Anemia Panel: No results for input(s): VITAMINB12, FOLATE, FERRITIN, TIBC, IRON, RETICCTPCT in the last 72 hours.  Dg Chest Port 1 View  Result Date: 11/25/2015 CLINICAL DATA:  Chest pain EXAM: PORTABLE CHEST 1 VIEW COMPARISON:  11/04/2015 FINDINGS: Chronic cardiopericardial enlargement. Status post CABG and coronary stenting. There is no edema, consolidation, effusion, or pneumothorax. No acute osseous finding. IMPRESSION: Stable.  No evidence of acute disease. Electronically Signed   By: Marnee Spring M.D.   On: 11/25/2015 07:36     Echo mildly reduced left ventricular function, with LVEF 45-50%, with mild to moderate aortic stenosis  TELEMETRY: Atrial fibrillation with a controlled rate:  ASSESSMENT AND PLAN:  Active Problems:   Atrial fibrillation with RVR (HCC)    1. Paroxysmal atrial fibrillation, rate controlled, on Xarelto for stroke prevention 2. CAD, status post CABG, without chest pain, with negative troponin  Recommendations  1. DC aspirin 2. Continue Xarelto for stroke  prevention 3. DC amlodipine 4. Change Cardizem to Cardizem CD 240 mg daily 5. Change metoprolol titrate to 50 mg twice a day 6. Follow-up with Dr. Sheran SpineFath   Hammond Obeirne, MD, PhD, [facility] 11/26/2015 9:35 AM

## 2015-11-26 NOTE — Progress Notes (Signed)
D/c home per md order, iv's d/c, prescriptions and instructions given, accompanied out via wheelchair with nursing and pts daughter

## 2015-11-28 LAB — GLUCOSE, CAPILLARY: Glucose-Capillary: 153 mg/dL — ABNORMAL HIGH (ref 65–99)

## 2015-12-28 ENCOUNTER — Encounter: Payer: Self-pay | Admitting: Medical Oncology

## 2015-12-28 ENCOUNTER — Emergency Department: Payer: Medicare Other

## 2015-12-28 ENCOUNTER — Emergency Department
Admission: EM | Admit: 2015-12-28 | Discharge: 2015-12-28 | Disposition: A | Discharge status: Home / Self Care | Payer: Medicare Other | Attending: Emergency Medicine | Admitting: Emergency Medicine

## 2015-12-28 DIAGNOSIS — I5022 Chronic systolic (congestive) heart failure: Secondary | ICD-10-CM | POA: Insufficient documentation

## 2015-12-28 DIAGNOSIS — I10 Essential (primary) hypertension: Secondary | ICD-10-CM | POA: Insufficient documentation

## 2015-12-28 DIAGNOSIS — Z79899 Other long term (current) drug therapy: Secondary | ICD-10-CM | POA: Diagnosis not present

## 2015-12-28 DIAGNOSIS — Z7984 Long term (current) use of oral hypoglycemic drugs: Secondary | ICD-10-CM | POA: Diagnosis not present

## 2015-12-28 DIAGNOSIS — R51 Headache: Secondary | ICD-10-CM | POA: Insufficient documentation

## 2015-12-28 DIAGNOSIS — I110 Hypertensive heart disease with heart failure: Secondary | ICD-10-CM | POA: Diagnosis not present

## 2015-12-28 DIAGNOSIS — R42 Dizziness and giddiness: Secondary | ICD-10-CM | POA: Diagnosis not present

## 2015-12-28 DIAGNOSIS — R519 Headache, unspecified: Secondary | ICD-10-CM

## 2015-12-28 DIAGNOSIS — E119 Type 2 diabetes mellitus without complications: Secondary | ICD-10-CM | POA: Insufficient documentation

## 2015-12-28 DIAGNOSIS — I2510 Atherosclerotic heart disease of native coronary artery without angina pectoris: Secondary | ICD-10-CM | POA: Insufficient documentation

## 2015-12-28 LAB — CBC
HEMATOCRIT: 37.1 % (ref 35.0–47.0)
HEMOGLOBIN: 12.5 g/dL (ref 12.0–16.0)
MCH: 28.2 pg (ref 26.0–34.0)
MCHC: 33.7 g/dL (ref 32.0–36.0)
MCV: 83.6 fL (ref 80.0–100.0)
Platelets: 227 10*3/uL (ref 150–440)
RBC: 4.44 MIL/uL (ref 3.80–5.20)
RDW: 15.1 % — ABNORMAL HIGH (ref 11.5–14.5)
WBC: 10.7 10*3/uL (ref 3.6–11.0)

## 2015-12-28 LAB — BASIC METABOLIC PANEL
ANION GAP: 7 (ref 5–15)
BUN: 17 mg/dL (ref 6–20)
CHLORIDE: 107 mmol/L (ref 101–111)
CO2: 27 mmol/L (ref 22–32)
Calcium: 8.9 mg/dL (ref 8.9–10.3)
Creatinine, Ser: 0.59 mg/dL (ref 0.44–1.00)
GFR calc Af Amer: >60 mL/min (ref >60)
GLUCOSE: 128 mg/dL — AB (ref 65–99)
POTASSIUM: 4.1 mmol/L (ref 3.5–5.1)
Sodium: 141 mmol/L (ref 135–145)

## 2015-12-28 LAB — GLUCOSE, CAPILLARY: GLUCOSE-CAPILLARY: 114 mg/dL — AB (ref 65–99)

## 2015-12-28 LAB — TROPONIN I: Troponin I: <0.03 ng/mL (ref <0.03)

## 2015-12-28 MED ORDER — ACETAMINOPHEN 500 MG PO TABS
ORAL_TABLET | ORAL | Status: AC
  Administered 2015-12-28: 1000 mg via ORAL
  Filled 2015-12-28: qty 2

## 2015-12-28 MED ORDER — LORAZEPAM 0.5 MG PO TABS: 0.5000 mg | ORAL_TABLET | Freq: Three times a day (TID) | ORAL | 0 refills | Status: DC | PRN

## 2015-12-28 MED ORDER — HYDRALAZINE HCL 25 MG PO TABS: 25.0000 mg | ORAL_TABLET | Freq: Three times a day (TID) | ORAL | 1 refills | Status: DC

## 2015-12-28 MED ORDER — ACETAMINOPHEN 500 MG PO TABS
1000.0000 mg | ORAL_TABLET | Freq: Once | ORAL | Status: AC
  Administered 2015-12-28: 1000 mg via ORAL

## 2015-12-28 MED ORDER — HYDRALAZINE HCL 20 MG/ML IJ SOLN
10.0000 mg | Freq: Once | INTRAMUSCULAR | Status: AC
  Administered 2015-12-28: 10 mg via INTRAVENOUS
  Filled 2015-12-28: qty 1

## 2015-12-28 NOTE — ED Provider Notes (Signed)
[facility] Emergency Department Provider Note  Time seen: 3:59 PM  I have reviewed the triage vital signs and the nursing notes.   HISTORY  Chief Complaint Hypertension; Chest Pain; and Dizziness    HPI Robin [name] is a 75 y.o. female with a past medical history of atrial fibrillation, CHF, diabetes, hypertension who presents to the emergency department for elevated blood pressure, intermittent chest pain and a headache. According to the patient for the past 3 days she has had blood pressure greater than 200 at home. Patient states she's been having intermittent chest discomfort although denies any currently. Also states she has been having a significant headache which she describes as an 9/10. Denies any weakness or numbness. Denies any fever.  Past Medical History:  Diagnosis Date  . Afib (HCC)   . Anxiety   . CHF (congestive heart failure) (HCC)   . Coronary artery disease   . Diabetes mellitus without complication (HCC)   . Hyperlipidemia   . Hypertension     Patient Active Problem List   Diagnosis Date Noted  . Chest pain 11/04/2015  . HTN (hypertension) 11/04/2015  . HLD (hyperlipidemia) 11/04/2015  . CAD (coronary artery disease) 11/04/2015  . Chronic systolic CHF (congestive heart failure) (HCC) 11/04/2015  . Diabetes (HCC) 11/04/2015  . Atrial fibrillation with RVR (HCC) 09/13/2015    Past Surgical History:  Procedure Laterality Date  . NO PAST SURGERIES      Prior to Admission medications   Medication Sig Start Date End Date Taking? Authorizing Provider  albuterol (ACCUNEB) 1.25 MG/3ML nebulizer solution Take 1.25 mg by nebulization every 4 (four) hours as needed for wheezing or shortness of breath.     Historical Provider, MD  albuterol-ipratropium (COMBIVENT) 18-103 MCG/ACT inhaler Inhale 2 puffs into the lungs 4 (four) times daily as needed for wheezing or shortness of breath.    Historical Provider, MD  atorvastatin (LIPITOR)  80 MG tablet Take 1 tablet (80 mg total) by mouth daily. Patient taking differently: Take 80 mg by mouth at bedtime.  09/15/15   Auburn Bilberry, MD  carisoprodol (SOMA) 350 MG tablet Take 1 tablet (350 mg total) by mouth at bedtime as needed for muscle spasms. 11/06/15   Enid Baas, MD  diltiazem (CARDIZEM CD) 240 MG 24 hr capsule Take 1 capsule (240 mg total) by mouth daily. 11/27/15   Enedina Finner, MD  furosemide (LASIX) 20 MG tablet Take 1 tablet (20 mg total) by mouth daily. Patient not taking: Reported on 11/25/2015 09/25/15   Irean Hong, MD  lisinopril (PRINIVIL,ZESTRIL) 20 MG tablet Take 1 tablet (20 mg total) by mouth daily. 09/15/15   Auburn Bilberry, MD  meclizine (ANTIVERT) 25 MG tablet Take 1 tablet (25 mg total) by mouth 3 (three) times daily as needed for dizziness. 11/06/15   Enid Baas, MD  metFORMIN (GLUCOPHAGE) 500 MG tablet Take 1 tablet (500 mg total) by mouth 2 (two) times daily with a meal. Patient taking differently: Take 500 mg by mouth at bedtime.  09/15/15   Auburn Bilberry, MD  metoprolol (LOPRESSOR) 50 MG tablet Take 1 tablet (50 mg total) by mouth 2 (two) times daily. 11/26/15   Enedina Finner, MD  nitroGLYCERIN (NITROSTAT) 0.4 MG SL tablet Place 0.4 mg under the tongue every 5 (five) minutes as needed for chest pain.     Historical Provider, MD  ondansetron (ZOFRAN ODT) 4 MG disintegrating tablet Take 1 tablet (4 mg total) by mouth every 8 (eight) hours as  needed for nausea or vomiting. 11/06/15   Enid Baasadhika Kalisetti, MD  pantoprazole (PROTONIX) 40 MG tablet Take 1 tablet (40 mg total) by mouth daily. Patient taking differently: Take 40 mg by mouth daily as needed. For acid reflux 11/06/15   Enid Baasadhika Kalisetti, MD  rivaroxaban (XARELTO) 20 MG TABS tablet Take 1 tablet (20 mg total) by mouth daily. 09/15/15   Auburn BilberryShreyang Patel, MD    No Known Allergies  Family History  Problem Relation Age of Onset  . Hypertension Mother   . Heart failure Mother   . Heart disease Father      Social History Social History  Substance Use Topics  . Smoking status: Never Smoker  . Smokeless tobacco: Never Used  . Alcohol use No    Review of Systems Constitutional: Negative for fever. Cardiovascular: Negative for chest pain. Respiratory: Negative for shortness of breath. Gastrointestinal: Negative for abdominal pain Musculoskeletal: Negative for back pain. Neurological: Positive for headache. Denies focal weakness or numbness. 10-point ROS otherwise negative.  ____________________________________________   PHYSICAL EXAM:  VITAL SIGNS: ED Triage Vitals  Enc Vitals Group     BP 12/28/15 1435 (!) 238/51     Pulse Rate 12/28/15 1435 (!) 58     Resp 12/28/15 1435 20     Temp 12/28/15 1435 97.7 F (36.5 C)     Temp Source 12/28/15 1435 Oral     SpO2 12/28/15 1435 96 %     Weight 12/28/15 1436 158 lb (71.7 kg)     Height 12/28/15 1436 5\' 4"  (1.626 m)     Head Circumference --      Peak Flow --      Pain Score --      Pain Loc --      Pain Edu? --      Excl. in GC? --     Constitutional: Alert and oriented. Well appearing and in no distress. Eyes: Normal exam ENT   Head: Normocephalic and atraumatic.   Mouth/Throat: Mucous membranes are moist. Cardiovascular: Normal rate, regular rhythm. No murmur Respiratory: Normal respiratory effort without tachypnea nor retractions. Breath sounds are clear Gastrointestinal: Soft and nontender. No distention. Musculoskeletal: Nontender with normal range of motion in all extremities. Neurologic:  Normal speech and language. No gross focal neurologic deficits Skin:  Skin is warm, dry and intact.  Psychiatric: Patient is somewhat anxious appearing.  ____________________________________________    EKG  EKG reviewed and interpreted by myself shows sinus bradycardia at 59 bpm, widened QRS, normal axis, largely normal intervals with nonspecific ST changes without ST  elevation.  ____________________________________________    RADIOLOGY  CT negative  ____________________________________________   INITIAL IMPRESSION / ASSESSMENT AND PLAN / ED COURSE  Pertinent labs & imaging results that were available during my care of the patient were reviewed by me and considered in my medical decision making (see chart for details).  The patient presents the emergency department with an elevated blood pressure of 230 systolic. Patient states a 9/10 headache. States intermittent chest discomfort including yesterday but denies any today. Patient denies any focal weakness or numbness. Overall she appears well, does appear somewhat anxious, she states she had an ophthalmology appointment Monday they told her that she had bilateral cataracts that will need surgery at some point. Patient states this has made her anxious, but she does not wish for any anxiety medication. Refuses anxiety medication in the emergency department. Patient's blood pressure currently 223/66. We will obtain a head CT, lab work, treat with a  10 mg dose of IV hydralazine. Patient's only antihypertensive medication is metoprolol. If hydralazine is successful we will likely initiate hydralazine treatment in addition to metoprolol at the patient's heart rate is already in the upper 50s I would not add further beta blocker or calcium channel blocker.  CT is negative for acute abnormality. Patient's blood pressure is decreased 172/45. Patient states her headache is improved. We will start the patient on hydralazine in addition to her metoprolol. Patient will follow-up with her primary care doctor tomorrow. I discussed return precautions. The patient is agreeable and asking to be discharged home at this time.  ____________________________________________   FINAL CLINICAL IMPRESSION(S) / ED DIAGNOSES  Hypertension Headache    Minna Antis, MD 12/28/15 1827

## 2015-12-28 NOTE — ED Triage Notes (Signed)
Pt here via ems with reports for the past 3 days she has noted that her BP has been elevated and she has been having dizziness. Pt also reports intermittent chest pain that is not present at time of triage.

## 2016-01-15 IMAGING — CR DG CHEST 2V
1 series · 2 of 2 positions shown · non-contrast
Comparison: 07/31/2013

CLINICAL DATA: Chest pain

EXAM:
CHEST  2 VIEW

[Series 1: dxr chest pa (or ap) and lateral · 0.14mm/px · 2 of 2 slices shown]
[im 1/2]
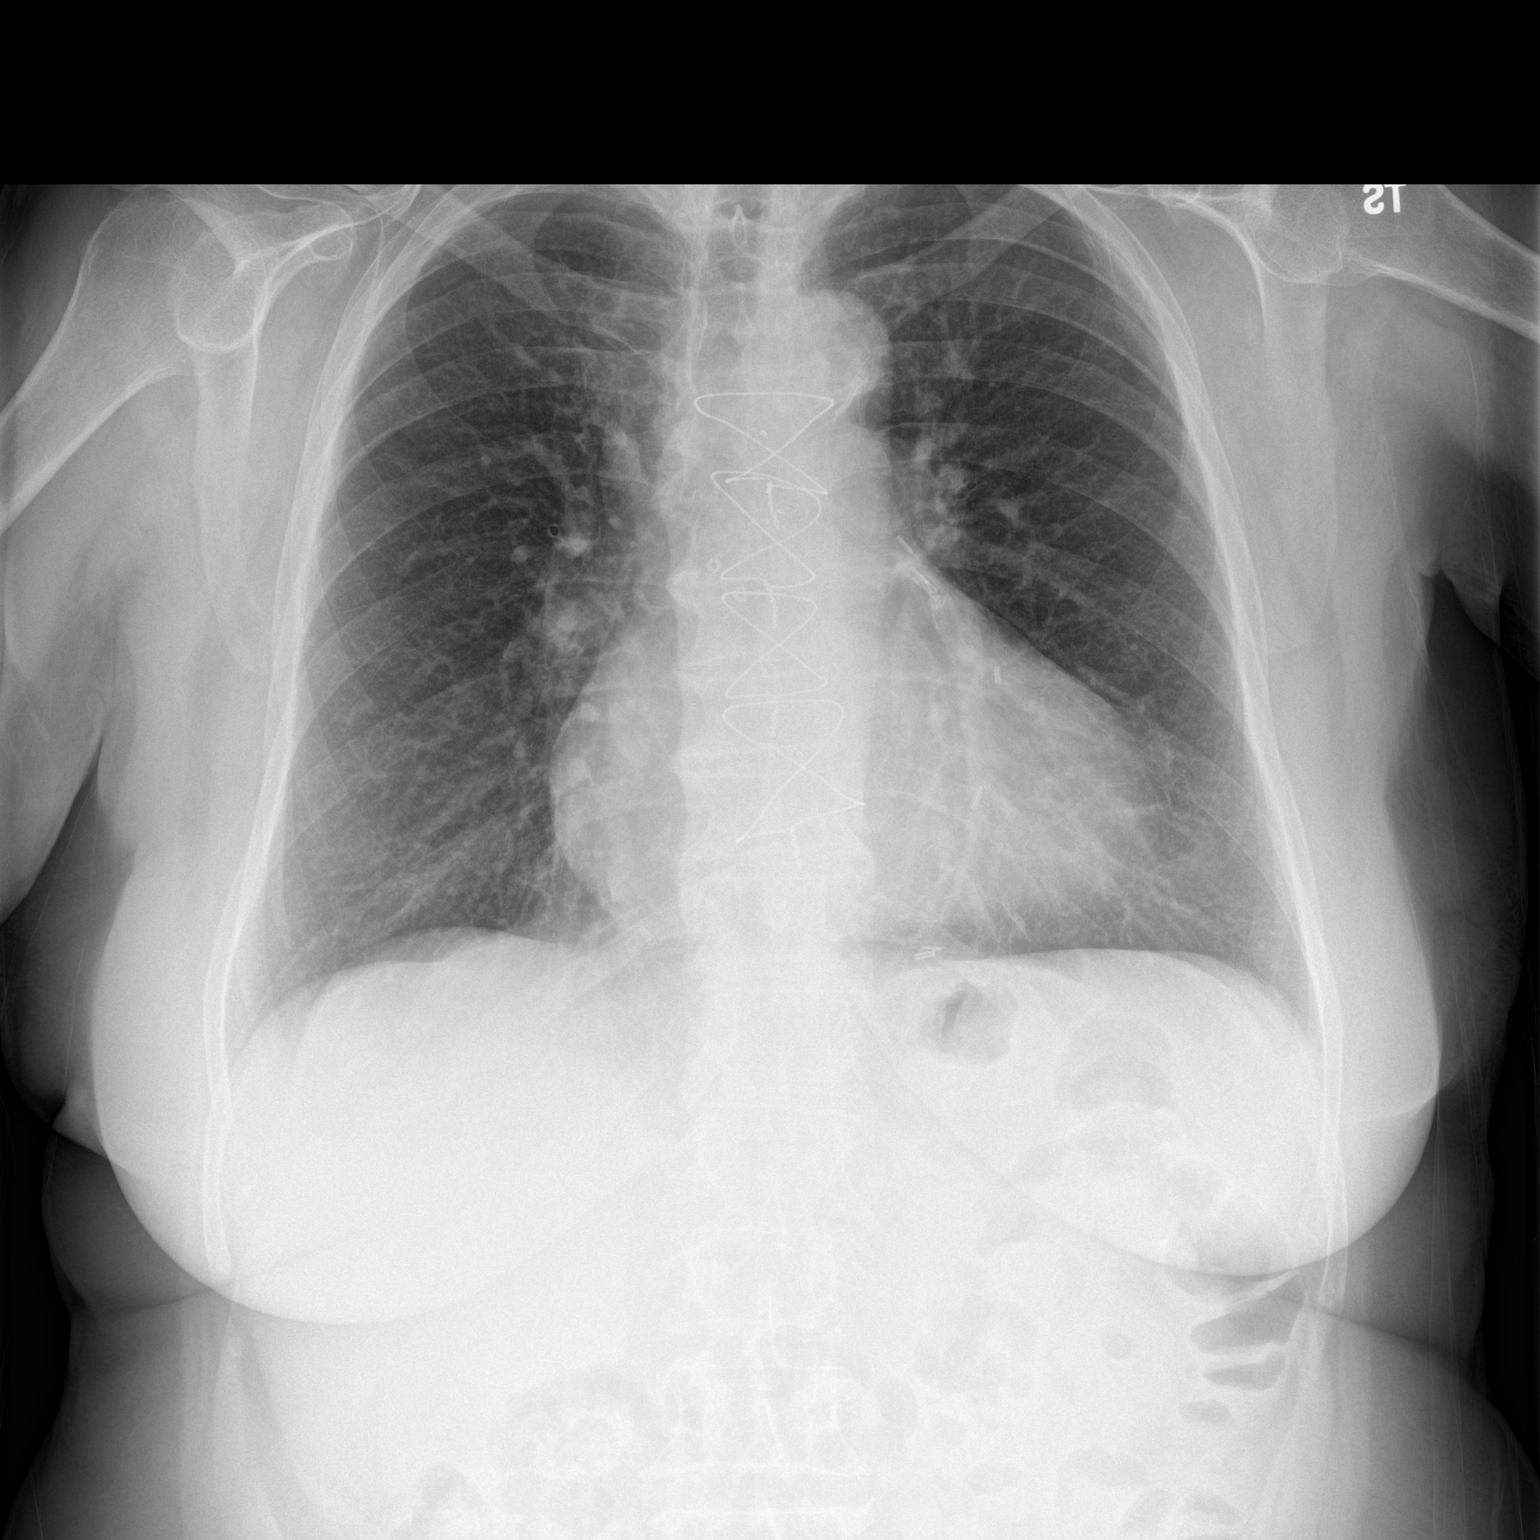
[im 2/2]
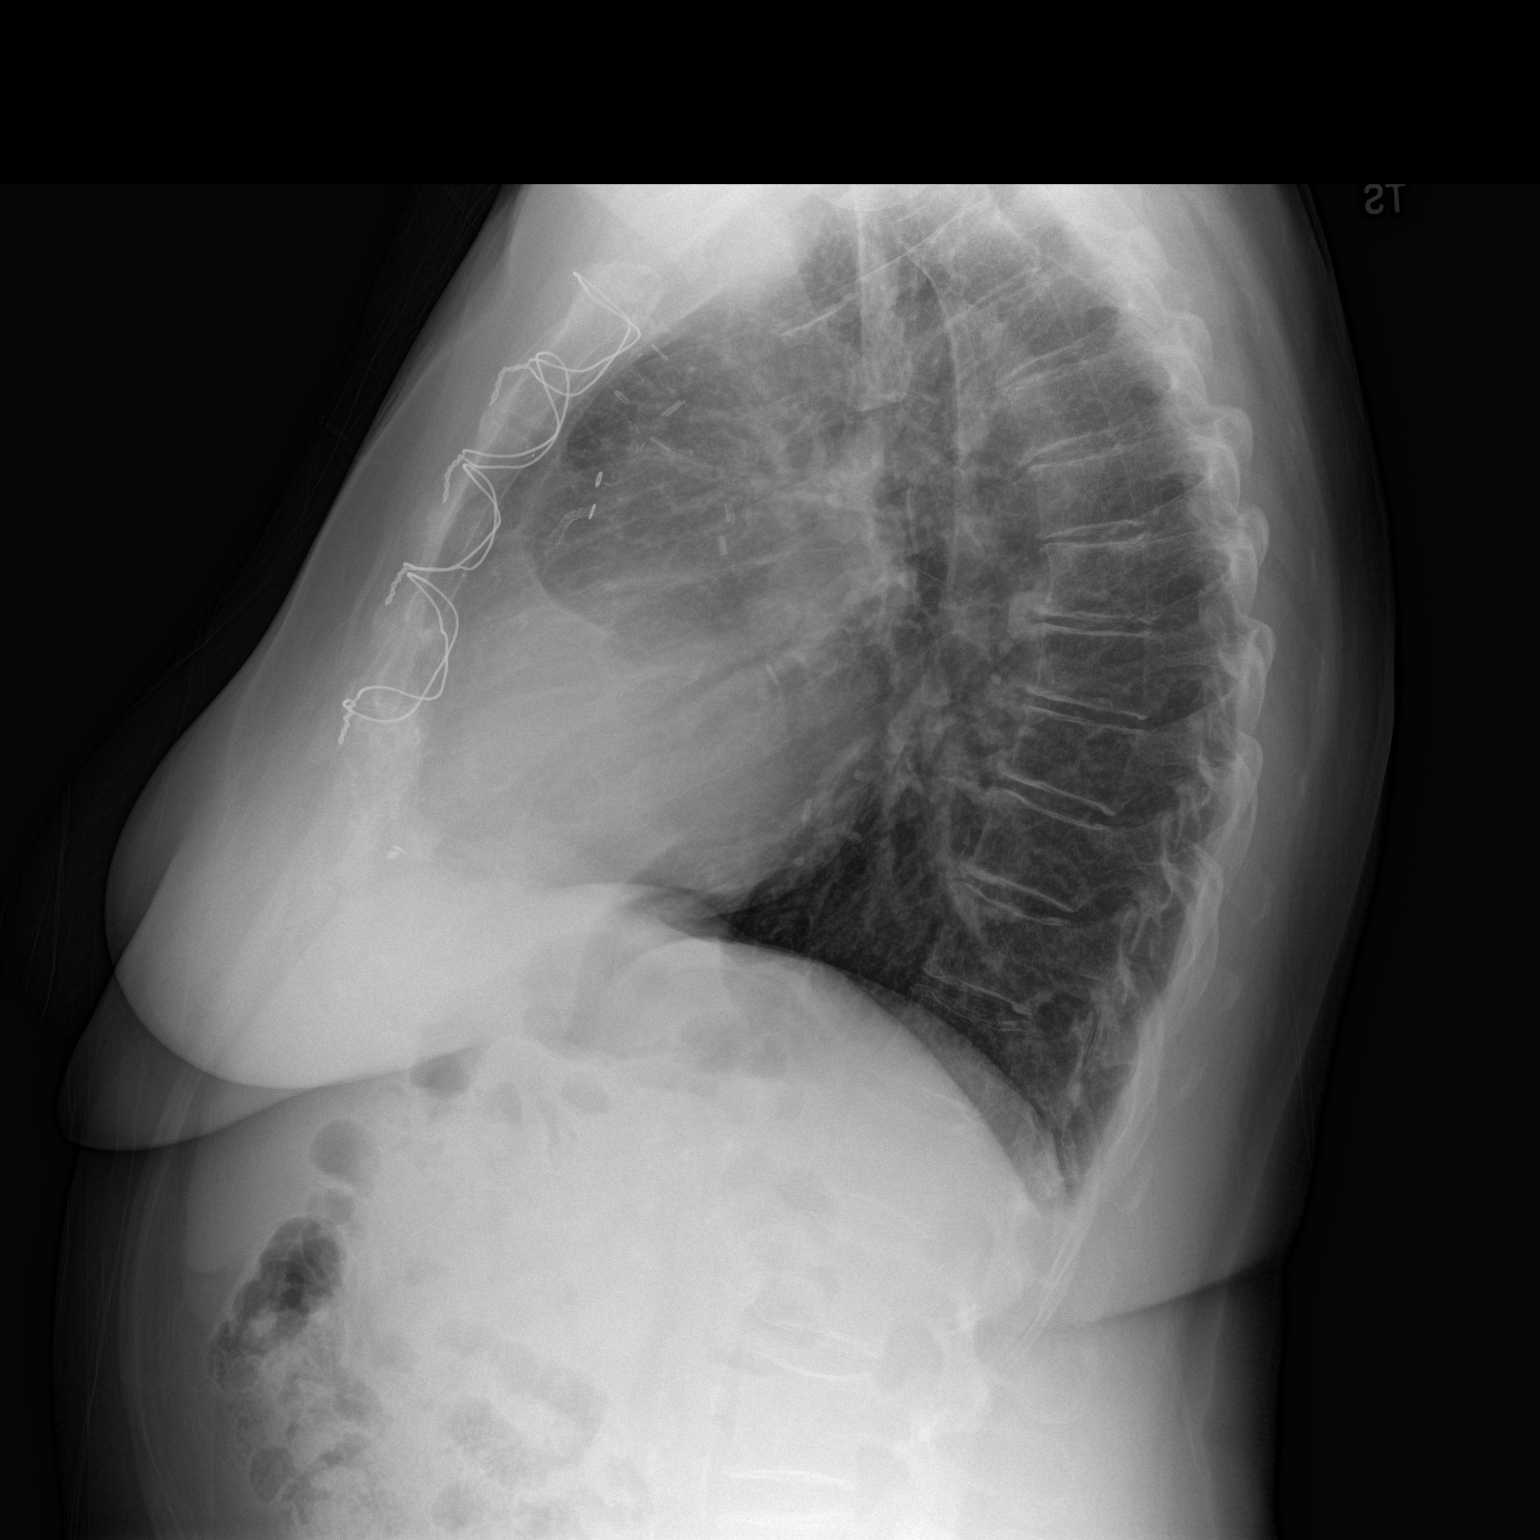

[2 of 2 positions shown; findings below may reference images not displayed]

FINDINGS: Mild bilateral interstitial thickening, likely chronic. There is no
focal parenchymal opacity, pleural effusion, or pneumothorax. The
heart and mediastinal contours are stable. Prior CABG.

The osseous structures are unremarkable.
IMPRESSION: No active cardiopulmonary disease.

## 2016-01-24 ENCOUNTER — Emergency Department: Payer: Medicare Other

## 2016-01-24 ENCOUNTER — Encounter: Payer: Self-pay | Admitting: Emergency Medicine

## 2016-01-24 ENCOUNTER — Inpatient Hospital Stay
Admission: EM | Admit: 2016-01-24 | Discharge: 2016-01-25 | DRG: 309 | Disposition: A | Discharge status: Home / Self Care | Payer: Medicare Other | Attending: Internal Medicine | Admitting: Internal Medicine

## 2016-01-24 DIAGNOSIS — Z8249 Family history of ischemic heart disease and other diseases of the circulatory system: Secondary | ICD-10-CM

## 2016-01-24 DIAGNOSIS — I4891 Unspecified atrial fibrillation: Secondary | ICD-10-CM | POA: Diagnosis present

## 2016-01-24 DIAGNOSIS — E119 Type 2 diabetes mellitus without complications: Secondary | ICD-10-CM | POA: Diagnosis present

## 2016-01-24 DIAGNOSIS — Z7901 Long term (current) use of anticoagulants: Secondary | ICD-10-CM

## 2016-01-24 DIAGNOSIS — F419 Anxiety disorder, unspecified: Secondary | ICD-10-CM | POA: Diagnosis present

## 2016-01-24 DIAGNOSIS — I5022 Chronic systolic (congestive) heart failure: Secondary | ICD-10-CM | POA: Diagnosis present

## 2016-01-24 DIAGNOSIS — I2510 Atherosclerotic heart disease of native coronary artery without angina pectoris: Secondary | ICD-10-CM | POA: Diagnosis present

## 2016-01-24 DIAGNOSIS — E785 Hyperlipidemia, unspecified: Secondary | ICD-10-CM | POA: Diagnosis present

## 2016-01-24 DIAGNOSIS — Z9114 Patient's other noncompliance with medication regimen: Secondary | ICD-10-CM | POA: Diagnosis not present

## 2016-01-24 DIAGNOSIS — Z951 Presence of aortocoronary bypass graft: Secondary | ICD-10-CM

## 2016-01-24 DIAGNOSIS — R079 Chest pain, unspecified: Secondary | ICD-10-CM | POA: Diagnosis not present

## 2016-01-24 DIAGNOSIS — Z7984 Long term (current) use of oral hypoglycemic drugs: Secondary | ICD-10-CM | POA: Diagnosis not present

## 2016-01-24 DIAGNOSIS — K219 Gastro-esophageal reflux disease without esophagitis: Secondary | ICD-10-CM | POA: Diagnosis present

## 2016-01-24 DIAGNOSIS — I110 Hypertensive heart disease with heart failure: Secondary | ICD-10-CM | POA: Diagnosis present

## 2016-01-24 LAB — COMPREHENSIVE METABOLIC PANEL
ALBUMIN: 4.1 g/dL (ref 3.5–5.0)
ALK PHOS: 99 U/L (ref 38–126)
ALT: 20 U/L (ref 14–54)
AST: 24 U/L (ref 15–41)
Anion gap: 8 (ref 5–15)
BILIRUBIN TOTAL: 0.4 mg/dL (ref 0.3–1.2)
BUN: 24 mg/dL — AB (ref 6–20)
CALCIUM: 8.8 mg/dL — AB (ref 8.9–10.3)
CO2: 26 mmol/L (ref 22–32)
CREATININE: 0.61 mg/dL (ref 0.44–1.00)
Chloride: 107 mmol/L (ref 101–111)
GFR calc Af Amer: >60 mL/min (ref >60)
GLUCOSE: 182 mg/dL — AB (ref 65–99)
POTASSIUM: 4.1 mmol/L (ref 3.5–5.1)
Sodium: 141 mmol/L (ref 135–145)
TOTAL PROTEIN: 7.4 g/dL (ref 6.5–8.1)

## 2016-01-24 LAB — CBC WITH DIFFERENTIAL/PLATELET
BASOS ABS: 0.1 10*3/uL (ref 0–0.1)
BASOS PCT: 1 %
Eosinophils Absolute: 0.1 10*3/uL (ref 0–0.7)
Eosinophils Relative: 1 %
HEMATOCRIT: 40.2 % (ref 35.0–47.0)
HEMOGLOBIN: 13.3 g/dL (ref 12.0–16.0)
LYMPHS PCT: 16 %
Lymphs Abs: 1.7 10*3/uL (ref 1.0–3.6)
MCH: 28.1 pg (ref 26.0–34.0)
MCHC: 33.2 g/dL (ref 32.0–36.0)
MCV: 84.6 fL (ref 80.0–100.0)
Monocytes Absolute: 0.6 10*3/uL (ref 0.2–0.9)
Monocytes Relative: 6 %
NEUTROS ABS: 7.8 10*3/uL — AB (ref 1.4–6.5)
NEUTROS PCT: 76 %
Platelets: 228 10*3/uL (ref 150–440)
RBC: 4.75 MIL/uL (ref 3.80–5.20)
RDW: 14.5 % (ref 11.5–14.5)
WBC: 10.3 10*3/uL (ref 3.6–11.0)

## 2016-01-24 LAB — TROPONIN I
Troponin I: <0.03 ng/mL (ref <0.03)
Troponin I: <0.03 ng/mL (ref <0.03)
Troponin I: <0.03 ng/mL (ref <0.03)

## 2016-01-24 LAB — TSH: TSH: 1.103 u[IU]/mL (ref 0.350–4.500)

## 2016-01-24 LAB — PROTIME-INR
INR: 0.92
Prothrombin Time: 12.3 seconds (ref 11.4–15.2)

## 2016-01-24 MED ORDER — SODIUM CHLORIDE 0.9% FLUSH: 3.0000 mL | Freq: Two times a day (BID) | INTRAVENOUS | Status: DC

## 2016-01-24 MED ORDER — NITROGLYCERIN 0.4 MG SL SUBL
0.4000 mg | SUBLINGUAL_TABLET | SUBLINGUAL | Status: DC | PRN
  Administered 2016-01-24 (×2): 0.4 mg via SUBLINGUAL
  Filled 2016-01-24 (×2): qty 1

## 2016-01-24 MED ORDER — RIVAROXABAN 20 MG PO TABS
20.0000 mg | ORAL_TABLET | Freq: Every day | ORAL | Status: DC
  Filled 2016-01-24: qty 1

## 2016-01-24 MED ORDER — PANTOPRAZOLE SODIUM 40 MG PO TBEC
40.0000 mg | DELAYED_RELEASE_TABLET | Freq: Every day | ORAL | Status: DC
  Administered 2016-01-25: 40 mg via ORAL
  Filled 2016-01-24: qty 1

## 2016-01-24 MED ORDER — DIPHENHYDRAMINE HCL 50 MG/ML IJ SOLN
INTRAMUSCULAR | Status: AC
  Filled 2016-01-24: qty 1

## 2016-01-24 MED ORDER — LORAZEPAM 0.5 MG PO TABS
0.5000 mg | ORAL_TABLET | Freq: Three times a day (TID) | ORAL | Status: DC | PRN
  Administered 2016-01-25: 0.5 mg via ORAL
  Filled 2016-01-24: qty 1

## 2016-01-24 MED ORDER — SODIUM CHLORIDE 0.9 % IV SOLN: 250.0000 mL | INTRAVENOUS | Status: DC | PRN

## 2016-01-24 MED ORDER — ACETAMINOPHEN 500 MG PO TABS
1000.0000 mg | ORAL_TABLET | Freq: Once | ORAL | Status: AC
  Administered 2016-01-24: 1000 mg via ORAL
  Filled 2016-01-24: qty 2

## 2016-01-24 MED ORDER — ALBUTEROL SULFATE (2.5 MG/3ML) 0.083% IN NEBU: 1.2500 mg | INHALATION_SOLUTION | RESPIRATORY_TRACT | Status: DC | PRN

## 2016-01-24 MED ORDER — METOPROLOL TARTRATE 25 MG PO TABS: 25.0000 mg | ORAL_TABLET | Freq: Two times a day (BID) | ORAL | Status: DC

## 2016-01-24 MED ORDER — AMIODARONE LOAD VIA INFUSION
150.0000 mg | Freq: Once | INTRAVENOUS | Status: DC
  Filled 2016-01-24: qty 83.34

## 2016-01-24 MED ORDER — NITROGLYCERIN 0.4 MG SL SUBL: 0.4000 mg | SUBLINGUAL_TABLET | SUBLINGUAL | Status: DC | PRN

## 2016-01-24 MED ORDER — CARISOPRODOL 350 MG PO TABS: 350.0000 mg | ORAL_TABLET | Freq: Three times a day (TID) | ORAL | Status: DC | PRN

## 2016-01-24 MED ORDER — METOPROLOL TARTRATE 25 MG PO TABS
25.0000 mg | ORAL_TABLET | Freq: Four times a day (QID) | ORAL | Status: DC
  Administered 2016-01-24 (×2): 25 mg via ORAL
  Filled 2016-01-24 (×2): qty 1

## 2016-01-24 MED ORDER — ONDANSETRON HCL 4 MG PO TABS: 4.0000 mg | ORAL_TABLET | Freq: Three times a day (TID) | ORAL | Status: DC | PRN

## 2016-01-24 MED ORDER — ATORVASTATIN CALCIUM 20 MG PO TABS
80.0000 mg | ORAL_TABLET | Freq: Every day | ORAL | Status: DC
Start: 2016-01-24 — End: 2016-01-25
  Filled 2016-01-24: qty 4

## 2016-01-24 MED ORDER — AMIODARONE HCL IN DEXTROSE 360-4.14 MG/200ML-% IV SOLN: 30.0000 mg/h | INTRAVENOUS | Status: DC

## 2016-01-24 MED ORDER — SODIUM CHLORIDE 0.9% FLUSH
3.0000 mL | Freq: Two times a day (BID) | INTRAVENOUS | Status: DC
  Administered 2016-01-24 (×2): 3 mL via INTRAVENOUS

## 2016-01-24 MED ORDER — MECLIZINE HCL 25 MG PO TABS
25.0000 mg | ORAL_TABLET | Freq: Three times a day (TID) | ORAL | Status: DC | PRN
  Filled 2016-01-24: qty 1

## 2016-01-24 MED ORDER — DIPHENHYDRAMINE HCL 50 MG/ML IJ SOLN
12.5000 mg | Freq: Once | INTRAMUSCULAR | Status: AC
  Administered 2016-01-24: 12.5 mg via INTRAVENOUS

## 2016-01-24 MED ORDER — METFORMIN HCL 500 MG PO TABS
500.0000 mg | ORAL_TABLET | Freq: Two times a day (BID) | ORAL | Status: DC
Start: 2016-01-24 — End: 2016-01-25
  Filled 2016-01-24 (×2): qty 1

## 2016-01-24 MED ORDER — DIPHENHYDRAMINE HCL 50 MG/ML IJ SOLN: 25.0000 mg | Freq: Once | INTRAMUSCULAR | Status: DC

## 2016-01-24 MED ORDER — DILTIAZEM HCL 25 MG/5ML IV SOLN
10.0000 mg | Freq: Once | INTRAVENOUS | Status: AC
  Administered 2016-01-24: 10 mg via INTRAVENOUS
  Filled 2016-01-24: qty 5

## 2016-01-24 MED ORDER — METOPROLOL TARTRATE 50 MG PO TABS: 50.0000 mg | ORAL_TABLET | Freq: Two times a day (BID) | ORAL | Status: DC

## 2016-01-24 MED ORDER — IPRATROPIUM-ALBUTEROL 18-103 MCG/ACT IN AERO: 2.0000 | INHALATION_SPRAY | Freq: Four times a day (QID) | RESPIRATORY_TRACT | Status: DC | PRN

## 2016-01-24 MED ORDER — METOPROLOL TARTRATE 5 MG/5ML IV SOLN
10.0000 mg | Freq: Once | INTRAVENOUS | Status: AC
Start: 2016-01-24 — End: 2016-01-24
  Administered 2016-01-24: 5 mg via INTRAVENOUS
  Filled 2016-01-24: qty 10

## 2016-01-24 MED ORDER — FUROSEMIDE 20 MG PO TABS
20.0000 mg | ORAL_TABLET | Freq: Every day | ORAL | Status: DC
  Filled 2016-01-24: qty 1

## 2016-01-24 MED ORDER — HYDRALAZINE HCL 25 MG PO TABS
25.0000 mg | ORAL_TABLET | Freq: Three times a day (TID) | ORAL | Status: DC
  Administered 2016-01-24 (×2): 25 mg via ORAL
  Filled 2016-01-24 (×2): qty 1

## 2016-01-24 MED ORDER — LISINOPRIL 20 MG PO TABS
20.0000 mg | ORAL_TABLET | Freq: Every day | ORAL | Status: DC
  Administered 2016-01-25: 20 mg via ORAL
  Filled 2016-01-24: qty 1

## 2016-01-24 MED ORDER — SODIUM CHLORIDE 0.9% FLUSH
3.0000 mL | INTRAVENOUS | Status: DC | PRN
Start: 2016-01-24 — End: 2016-01-25

## 2016-01-24 MED ORDER — AMIODARONE HCL IN DEXTROSE 360-4.14 MG/200ML-% IV SOLN
60.0000 mg/h | INTRAVENOUS | Status: DC
Start: 2016-01-24 — End: 2016-01-24

## 2016-01-24 NOTE — ED Notes (Signed)
Spoke to Dr. Silverio Lay in regards to holding dose of lopressor. HR 119, BP 115/66. Will hold at this time. HR target range 110-125 bpm. If heart rate exceeds target range, verbal order to give remaining dose of lopressor.

## 2016-01-24 NOTE — ED Triage Notes (Signed)
Pt to ED via EMS from home for CP that started this am. Pt hx of a-fib, current HR 120-140s. Per EMS given 324 asprin en route. Pt A&Ox4.

## 2016-01-24 NOTE — ED Notes (Signed)
Pt called out for possible reaction, pt has redness and swelling noted on RT arm above IV access. MD at bedside, verbal orders for benadryl given. Pt in NAD at this time

## 2016-01-24 NOTE — ED Provider Notes (Signed)
[facility]-EMERGENCY DEPARTMENT Provider Note   CSN: [id number] Arrival date & time: 01/24/16  1121     History   Chief Complaint Chief Complaint  Patient presents with  . Chest Pain    HPI Robin [name] is a 75 y.o. female.hx of afib uncompliant with meds (suppose to be on pradaxa but hasn't been taking it), CAD s/p CABG and stents, DM, here with chest pain, palpitations. Patient states that she woke up this morning with substernal chest pain with radiation down both arms. She also noticed some palpitations and shortness of breath. She states that she's been taking her medicines but told me that she didn't take her medicines this morning. She also was supposed to take per [facility] but has not started yet. As recently admitted to [facility] for similar symptoms and was thought to have medication noncompliance. She does see Dr. Lady Gary from cardiology.     The history is provided by the patient.    Past Medical History:  Diagnosis Date  . Afib (HCC)   . Anxiety   . CHF (congestive heart failure) (HCC)   . Coronary artery disease   . Diabetes mellitus without complication (HCC)   . Hyperlipidemia   . Hypertension     Patient Active Problem List   Diagnosis Date Noted  . Chest pain 11/04/2015  . HTN (hypertension) 11/04/2015  . HLD (hyperlipidemia) 11/04/2015  . CAD (coronary artery disease) 11/04/2015  . Chronic systolic CHF (congestive heart failure) (HCC) 11/04/2015  . Diabetes (HCC) 11/04/2015  . Atrial fibrillation with RVR (HCC) 09/13/2015    Past Surgical History:  Procedure Laterality Date  . NO PAST SURGERIES      OB History    No data available       Home Medications    Prior to Admission medications   Medication Sig Start Date End Date Taking? Authorizing Provider  albuterol (ACCUNEB) 1.25 MG/3ML nebulizer solution Take 1.25 mg by nebulization every 4 (four) hours as needed for wheezing or shortness of breath.     Historical Provider, MD  albuterol-ipratropium  (COMBIVENT) 18-103 MCG/ACT inhaler Inhale 2 puffs into the lungs 4 (four) times daily as needed for wheezing or shortness of breath.    Historical Provider, MD  atorvastatin (LIPITOR) 80 MG tablet Take 1 tablet (80 mg total) by mouth daily. Patient taking differently: Take 80 mg by mouth at bedtime.  09/15/15   Auburn Bilberry, MD  carisoprodol (SOMA) 350 MG tablet Take 1 tablet (350 mg total) by mouth at bedtime as needed for muscle spasms. 11/06/15   Enid Baas, MD  diltiazem (CARDIZEM CD) 240 MG 24 hr capsule Take 1 capsule (240 mg total) by mouth daily. 11/27/15   Enedina Finner, MD  furosemide (LASIX) 20 MG tablet Take 1 tablet (20 mg total) by mouth daily. Patient not taking: Reported on 11/25/2015 09/25/15   Irean Hong, MD  hydrALAZINE (APRESOLINE) 25 MG tablet Take 1 tablet (25 mg total) by mouth 3 (three) times daily. 12/28/15 12/27/16  Minna Antis, MD  lisinopril (PRINIVIL,ZESTRIL) 20 MG tablet Take 1 tablet (20 mg total) by mouth daily. 09/15/15   Auburn Bilberry, MD  LORazepam (ATIVAN) 0.5 MG tablet Take 1 tablet (0.5 mg total) by mouth every 8 (eight) hours as needed for anxiety. 12/28/15 01/27/16  Minna Antis, MD  meclizine (ANTIVERT) 25 MG tablet Take 1 tablet (25 mg total) by mouth 3 (three) times daily as needed for dizziness. 11/06/15   Enid Baas, MD  metFORMIN (GLUCOPHAGE) 500 MG  tablet Take 1 tablet (500 mg total) by mouth 2 (two) times daily with a meal. Patient taking differently: Take 500 mg by mouth at bedtime.  09/15/15   Auburn Bilberry, MD  metoprolol (LOPRESSOR) 50 MG tablet Take 1 tablet (50 mg total) by mouth 2 (two) times daily. 11/26/15   Enedina Finner, MD  nitroGLYCERIN (NITROSTAT) 0.4 MG SL tablet Place 0.4 mg under the tongue every 5 (five) minutes as needed for chest pain.     Historical Provider, MD  ondansetron (ZOFRAN ODT) 4 MG disintegrating tablet Take 1 tablet (4 mg total) by mouth every 8 (eight) hours as needed for nausea or vomiting. 11/06/15   Enid Baas, MD  pantoprazole (PROTONIX) 40 MG tablet Take 1 tablet (40 mg total) by mouth daily. Patient taking differently: Take 40 mg by mouth daily as needed. For acid reflux 11/06/15   Enid Baas, MD  rivaroxaban (XARELTO) 20 MG TABS tablet Take 1 tablet (20 mg total) by mouth daily. 09/15/15   Auburn Bilberry, MD    Family History Family History  Problem Relation Age of Onset  . Hypertension Mother   . Heart failure Mother   . Heart disease Father     Social History Social History  Substance Use Topics  . Smoking status: Never Smoker  . Smokeless tobacco: Never Used  . Alcohol use No     Allergies   Patient has no known allergies.   Review of Systems Review of Systems  Cardiovascular: Positive for chest pain and palpitations.  All other systems reviewed and are negative.    Physical Exam Updated Vital Signs BP 115/66   Pulse (!) 25   Temp 97.7 F (36.5 C) (Oral)   Resp 15   Ht 5\' 4"  (1.626 m)   Wt 160 lb (72.6 kg)   SpO2 96%   BMI 27.46 kg/m   Physical Exam  Constitutional: She is oriented to person, place, and time.  Anxious   HENT:  Head: Normocephalic.  Eyes: EOM are normal. Pupils are equal, round, and reactive to light.  Neck: Normal range of motion. Neck supple.  Cardiovascular:  Tachy, irregular   Pulmonary/Chest: Effort normal and breath sounds normal.  Abdominal: Soft. Bowel sounds are normal. She exhibits no distension. There is no tenderness. There is no guarding.  Musculoskeletal: Normal range of motion.  Neurological: She is alert and oriented to person, place, and time. No cranial nerve deficit. Coordination normal.  Skin: Skin is warm.  Psychiatric: She has a normal mood and affect.  Nursing note and vitals reviewed.    ED Treatments / Results  Labs (all labs ordered are listed, but only abnormal results are displayed) Labs Reviewed  CBC WITH DIFFERENTIAL/PLATELET - Abnormal; Notable for the following:       Result Value    Neutro Abs 7.8 (*)    All other components within normal limits  COMPREHENSIVE METABOLIC PANEL - Abnormal; Notable for the following:    Glucose, Bld 182 (*)    BUN 24 (*)    Calcium 8.8 (*)    All other components within normal limits  TROPONIN I  PROTIME-INR    EKG  EKG Interpretation None      ED ECG REPORT I, Richardean Canal, the attending physician, personally viewed and interpreted this ECG.   Date: 01/24/2016  EKG Time: 11:24 am  Rate: 132  Rhythm: atrial fibrillation, rate 132  Axis: normal  Intervals:none  ST&T Change: nonspecific    Radiology Dg  Chest Port 1 View  Result Date: 01/24/2016 CLINICAL DATA:  Chest pain EXAM: PORTABLE CHEST 1 VIEW COMPARISON:  11/25/2015 chest radiograph. FINDINGS: Sternotomy wires appear aligned and intact. CABG clips overlie the mediastinum. Coronary stent overlies the left heart. Stable cardiomediastinal silhouette with top-normal heart size. No pneumothorax. No pleural effusion. Lungs appear clear, with no acute consolidative airspace disease and no pulmonary edema. IMPRESSION: No active disease. Electronically Signed   By: Delbert PhenixJason A Poff M.D.   On: 01/24/2016 11:49    Procedures Procedures (including critical care time)  CRITICAL CARE Performed by: Richardean Canalavid H Lissett Favorite   Total critical care time: 30 minutes  Critical care time was exclusive of separately billable procedures and treating other patients.  Critical care was necessary to treat or prevent imminent or life-threatening deterioration.  Critical care was time spent personally by me on the following activities: development of treatment plan with patient and/or surrogate as well as nursing, discussions with consultants, evaluation of patient's response to treatment, examination of patient, obtaining history from patient or surrogate, ordering and performing treatments and interventions, ordering and review of laboratory studies, ordering and review of radiographic studies, pulse  oximetry and re-evaluation of patient's condition.   Medications Ordered in ED Medications  nitroGLYCERIN (NITROSTAT) SL tablet 0.4 mg (0.4 mg Sublingual Given 01/24/16 1146)  diphenhydrAMINE (BENADRYL) injection 25 mg (0 mg Intravenous Hold 01/24/16 1145)  diltiazem (CARDIZEM) injection 10 mg (10 mg Intravenous Given 01/24/16 1133)  acetaminophen (TYLENOL) tablet 1,000 mg (1,000 mg Oral Given 01/24/16 1132)  metoprolol (LOPRESSOR) injection 10 mg (5 mg Intravenous Given 01/24/16 1208)  diphenhydrAMINE (BENADRYL) injection 12.5 mg (12.5 mg Intravenous Given 01/24/16 1156)     Initial Impression / Assessment and Plan / ED Course  I have reviewed the triage vital signs and the nursing notes.  Pertinent labs & imaging results that were available during my care of the patient were reviewed by me and considered in my medical decision making (see chart for details).  Clinical Course     Foye Spurlinglizabeth Dannenberg is a 75 y.o. female here with rapid afib, SOB, CP. Has hx of afib, CAD with CABG and stents. Concerned for rapid afib vs unstable angina. Will give nitro, cardizem.   11:50 am Given cardizem (she is suppose to be on it outpatient) but then had a rash and itchiness R arm. Likely allergic reaction. Given benadryl and itchiness improved. Will give lopressor. Wanted to start cardizem but will hold off.   12:37 PM Given 5 mg lopressor and HR ins the low 120s now. Will admit for rapid afib, r/o ACS.     Final Clinical Impressions(s) / ED Diagnoses   Final diagnoses:  None    New Prescriptions New Prescriptions   No medications on file     Charlynne Panderavid Hsienta Kajal Scalici, MD 01/24/16 1237

## 2016-01-24 NOTE — Consult Note (Signed)
[facility]  CARDIOLOGY CONSULT NOTE  Patient ID: Robin [name] MRN: [id number] DOB/AGE: 75/04/1940 75 y.o.  Admit date: 01/24/2016 Referring Physician Dr. Allena Katzpatel Primary Physician   Primary Cardiologist Dr. Lady GaryFath Reason for Consultation afib with rvr      HPI: Robin [name] is a 75 y.o.female patient who presents in follow-up. Has a history of Coronary artery disease status post Coronary artery bypass grafting as well as intermittent atrial fibrillation. She is a difficult historian and has been somwhat noncompiant with her meds in She denies chest pain. past. She admits to being out of her anticoagulation but reports being fairly compliant with her anti arryhtmics. Her heart rate has improved with iv diltiazem.       Review of Systems  Constitutional: Negative.   HENT: Negative.   Eyes: Negative.   Respiratory: Positive for shortness of breath.   Cardiovascular: Positive for palpitations.  Gastrointestinal: Negative.   Genitourinary: Negative.   Musculoskeletal: Negative.   Skin: Negative.   Neurological: Negative.   Endo/Heme/Allergies: Negative.   Psychiatric/Behavioral: Negative.     Past Medical History:  Diagnosis Date  . Afib (HCC)   . Anxiety   . CHF (congestive heart failure) (HCC)   . Coronary artery disease   . Diabetes mellitus without complication (HCC)   . Hyperlipidemia   . Hypertension     Family History  Problem Relation Age of Onset  . Hypertension Mother   . Heart failure Mother   . Heart disease Father     Social History   Social History  . Marital status: Single    Spouse name: N/A  . Number of children: N/A  . Years of education: N/A   Occupational History  . Not on file.   Social History Main Topics  . Smoking status: Never Smoker  . Smokeless tobacco: Never Used  . Alcohol use No  . Drug use: No  . Sexual activity: No   Other Topics Concern  . Not on file   Social History Narrative  .  No narrative on file    Past Surgical History:  Procedure Laterality Date  . CORONARY ARTERY BYPASS GRAFT    . NO PAST SURGERIES       Prescriptions Prior to Admission  Medication Sig Dispense Refill Last Dose  . albuterol (ACCUNEB) 1.25 MG/3ML nebulizer solution Take 1.25 mg by nebulization every 4 (four) hours as needed for wheezing or shortness of breath.    Past Month at Unknown time  . hydrALAZINE (APRESOLINE) 25 MG tablet Take 1 tablet (25 mg total) by mouth 3 (three) times daily. 90 tablet 1 01/24/2016 at 0800  . meclizine (ANTIVERT) 25 MG tablet Take 1 tablet (25 mg total) by mouth 3 (three) times daily as needed for dizziness. 30 tablet 0 prn at prn  . metoprolol succinate (TOPROL-XL) 100 MG 24 hr tablet Take 100 mg by mouth daily.   01/24/2016 at 0800  . nitroGLYCERIN (NITROSTAT) 0.4 MG SL tablet Place 0.4 mg under the tongue every 5 (five) minutes as needed for chest pain.    prn at prn  . ondansetron (ZOFRAN ODT) 4 MG disintegrating tablet Take 1 tablet (4 mg total) by mouth every 8 (eight) hours as needed for nausea or vomiting. 20 tablet 0 prn at prn  . pantoprazole (PROTONIX) 40 MG tablet Take 1 tablet (40 mg total) by mouth daily. (Patient taking differently: Take 40 mg by mouth daily as needed. For acid reflux) 30 tablet  2 01/24/2016 at 0800  . rivaroxaban (XARELTO) 20 MG TABS tablet Take 1 tablet (20 mg total) by mouth daily. (Patient not taking: Reported on 01/24/2016) 30 tablet 2 Not Taking at Unknown time    Physical Exam: Blood pressure (!) 127/92, pulse (!) 128, temperature 97.7 F (36.5 C), temperature source Oral, resp. rate 18, height 5\' 4"  (1.626 m), weight 72.6 kg (160 lb), SpO2 96 %.   Wt Readings from Last 1 Encounters:  01/24/16 72.6 kg (160 lb)     General appearance: alert and cooperative Head: Normocephalic, without obvious abnormality, atraumatic Cardio: irregularly irregular rhythm GI: soft, non-tender; bowel sounds normal; no masses,  no  organomegaly Extremities: extremities normal, atraumatic, no cyanosis or edema Pulses: 2+ and symmetric Neurologic: Grossly normal  Labs:   Lab Results  Component Value Date   WBC 10.3 01/24/2016   HGB 13.3 01/24/2016   HCT 40.2 01/24/2016   MCV 84.6 01/24/2016   PLT 228 01/24/2016    Recent Labs Lab 01/24/16 1124  NA 141  K 4.1  CL 107  CO2 26  BUN 24*  CREATININE 0.61  CALCIUM 8.8*  PROT 7.4  BILITOT 0.4  ALKPHOS 99  ALT 20  AST 24  GLUCOSE 182*   Lab Results  Component Value Date   CKTOTAL 79 02/11/2014   CKMB 1.3 02/11/2014   TROPONINI <0.03 01/24/2016      Radiology: no acute cardioopulmonary disease EKG: afib with variable vr  ASSESSMENT AND PLAN:  Robin [name] is a 75 y.o.female patient who presents in follow-up. Has a history of Coronary artery disease status post Coronary artery bypass grafting as well as intermittent atrial fibrillation. She has had noncompliance issues in the past. Will resume xarelto at 20 mg daily and restart metoprolol at 25 qid and use diltizem 30 q 6 if rate is not controlled. Will defer amiodarone at present.   Signed: Dalia Heading MD, [facility] 01/24/2016, 8:40 PM

## 2016-01-24 NOTE — Progress Notes (Signed)
ANTICOAGULATION CONSULT NOTE - Initial Consult  Pharmacy Consult for Rivaroxaban (Xarelto)  Indication: atrial fibrillation  No Known Allergies  Patient Measurements: Height: 5\' 4"  (162.6 cm) Weight: 160 lb (72.6 kg) IBW/kg (Calculated) : 54.7  Vital Signs: Temp: 97.7 F (36.5 C) (11/14 1124) Temp Source: Oral (11/14 1124) BP: 137/70 (11/14 1310) Pulse Rate: 125 (11/14 1310)   Recent Labs  01/24/16 1124  HGB 13.3  HCT 40.2  PLT 228  LABPROT 12.3  INR 0.92  CREATININE 0.61  TROPONINI <0.03    Estimated Creatinine Clearance: 59.4 mL/min (by C-G formula based on SCr of 0.61 mg/dL).  Medical History: Past Medical History:  Diagnosis Date  . Afib (HCC)   . Anxiety   . CHF (congestive heart failure) (HCC)   . Coronary artery disease   . Diabetes mellitus without complication (HCC)   . Hyperlipidemia   . Hypertension     Assessment: 75 yo female with PMH of A. Fib. Patient reports previously being prescribed Pradaxa (Dabigatran), but has not been compliant on medication. Pharmacy was originally consulted for Pradaxa dosing, however patient has been started on amiodarone drip. Due to interaction btwn Dabigatran and amiodarone, patient will be started on Rivaroxaban (Xarelto).   Plan:  Will start patient on  Rivaroxaban (Xarelto) 20mg  daily based on CrCl >74ml/min. Patient will need counseling on importance of compliance, Signs and Sx of bleeding, and taking medication with meals.   Gardner Candle, PharmD Clinical Pharmacist  01/24/2016,1:50 PM

## 2016-01-24 NOTE — ED Notes (Signed)
Pt refused benadryl, states " I want to just watch it, I don't want to take the medicine because it will make me sleepy" MD notified, told to hold benadryl at this time, will continue to monitor pt

## 2016-01-24 NOTE — H&P (Signed)
[facility] [place] at [facility]   PATIENT NAME: Robin [name]    MR#:  [id number]  DATE OF BIRTH:  09/12/1940  DATE OF ADMISSION:  01/24/2016  PRIMARY CARE PHYSICIAN: Leanna SatoMILES,LINDA M, MD   REQUESTING/REFERRING PHYSICIAN: Silverio LayYao MD  CHIEF COMPLAINT:   Chief Complaint  Patient presents with  . Chest Pain    HISTORY OF PRESENT ILLNESS: Robin Spurlinglizabeth Canche  is a 75 y.o. female with a known history of Atrial fibrillation, anxiety, congestive heart failure, coronary artery disease, diabetes, hypertension, hyperlipidemia who is presenting to the ED with chest pain since this morning she describes it as a sharp substernal chest pain that started earlier this morning and persisted all day. When she arrived to the ED she was noted to have A. fib with RVR. Patient apparently was given a dose of IV Cardizem and her right arm became swollen. Disc unclear if this is an allergy or not. She is on oral Cardizem daily at home. Patient has a history of medical noncompliance and was [facility] about a [facility] for similar type presentation at that time patient reports that she was shocked out of the rhythm. He was discharged on per DEXA which she has not been taking.  PAST MEDICAL HISTORY:   Past Medical History:  Diagnosis Date  . Afib (HCC)   . Anxiety   . CHF (congestive heart failure) (HCC)   . Coronary artery disease   . Diabetes mellitus without complication (HCC)   . Hyperlipidemia   . Hypertension     PAST SURGICAL HISTORY:  Past Surgical History:  Procedure Laterality Date  . CORONARY ARTERY BYPASS GRAFT    . NO PAST SURGERIES      SOCIAL HISTORY:  Social History  Substance Use Topics  . Smoking status: Never Smoker  . Smokeless tobacco: Never Used  . Alcohol use No    FAMILY HISTORY:  Family History  Problem Relation Age of Onset  . Hypertension Mother   . Heart failure Mother   . Heart disease Father     DRUG ALLERGIES: No Known  Allergies  REVIEW OF SYSTEMS:   CONSTITUTIONAL: No fever, fatigue or weakness.  EYES: No blurred or double vision.  EARS, NOSE, AND THROAT: No tinnitus or ear pain.  RESPIRATORY: No cough, shortness of breath, wheezing or hemoptysis.  CARDIOVASCULAR: Positive chest pain, orthopnea, edema.  GASTROINTESTINAL: No nausea, vomiting, diarrhea or abdominal pain.  GENITOURINARY: No dysuria, hematuria.  ENDOCRINE: No polyuria, nocturia,  HEMATOLOGY: No anemia, easy bruising or bleeding SKIN: No rash or lesion. MUSCULOSKELETAL: No joint pain or arthritis.   NEUROLOGIC: No tingling, numbness, weakness.  PSYCHIATRY: No anxiety or depression.   MEDICATIONS AT HOME:  Prior to Admission medications   Medication Sig Start Date End Date Taking? Authorizing Provider  albuterol (ACCUNEB) 1.25 MG/3ML nebulizer solution Take 1.25 mg by nebulization every 4 (four) hours as needed for wheezing or shortness of breath.    Yes Historical Provider, MD  hydrALAZINE (APRESOLINE) 25 MG tablet Take 1 tablet (25 mg total) by mouth 3 (three) times daily. 12/28/15 12/27/16 Yes Minna AntisKevin Paduchowski, MD  meclizine (ANTIVERT) 25 MG tablet Take 1 tablet (25 mg total) by mouth 3 (three) times daily as needed for dizziness. 11/06/15  Yes Enid Baasadhika Kalisetti, MD  metoprolol succinate (TOPROL-XL) 100 MG 24 hr tablet Take 100 mg by mouth daily. 12/31/15 01/30/16 Yes Historical Provider, MD  nitroGLYCERIN (NITROSTAT) 0.4 MG SL tablet Place 0.4 mg under the tongue  every 5 (five) minutes as needed for chest pain.    Yes Historical Provider, MD  ondansetron (ZOFRAN ODT) 4 MG disintegrating tablet Take 1 tablet (4 mg total) by mouth every 8 (eight) hours as needed for nausea or vomiting. 11/06/15  Yes Enid Baas, MD  pantoprazole (PROTONIX) 40 MG tablet Take 1 tablet (40 mg total) by mouth daily. Patient taking differently: Take 40 mg by mouth daily as needed. For acid reflux 11/06/15  Yes Enid Baas, MD  rivaroxaban (XARELTO)  20 MG TABS tablet Take 1 tablet (20 mg total) by mouth daily. Patient not taking: Reported on 01/24/2016 09/15/15   Auburn Bilberry, MD      PHYSICAL EXAMINATION:   VITAL SIGNS: Blood pressure (!) 150/81, pulse (!) 120, temperature 97.7 F (36.5 C), temperature source Oral, resp. rate 18, height 5\' 4"  (1.626 m), weight 160 lb (72.6 kg), SpO2 99 %.  GENERAL:  75 y.o.-year-old patient lying in the bed with no acute distress.  EYES: Pupils equal, round, reactive to light and accommodation. No scleral icterus. Extraocular muscles intact.  HEENT: Head atraumatic, normocephalic. Oropharynx and nasopharynx clear.  NECK:  Supple, no jugular venous distention. No thyroid enlargement, no tenderness.  LUNGS: Normal breath sounds bilaterally, no wheezing, rales,rhonchi or crepitation. No use of accessory muscles of respiration.  CARDIOVASCULAR:Irregularly regular No murmurs, rubs, or gallops.  ABDOMEN: Soft, nontender, nondistended. Bowel sounds present. No organomegaly or mass.  EXTREMITIES: No pedal edema, cyanosis, or clubbing.  NEUROLOGIC: Cranial nerves II through XII are intact. Muscle strength 5/5 in all extremities. Sensation intact. Gait not checked.  PSYCHIATRIC: The patient is alert and oriented x 3.  SKIN: No obvious rash, lesion, or ulcer.   LABORATORY PANEL:   CBC  Recent Labs Lab 01/24/16 1124  WBC 10.3  HGB 13.3  HCT 40.2  PLT 228  MCV 84.6  MCH 28.1  MCHC 33.2  RDW 14.5  LYMPHSABS 1.7  MONOABS 0.6  EOSABS 0.1  BASOSABS 0.1   ------------------------------------------------------------------------------------------------------------------  Chemistries   Recent Labs Lab 01/24/16 1124  NA 141  K 4.1  CL 107  CO2 26  GLUCOSE 182*  BUN 24*  CREATININE 0.61  CALCIUM 8.8*  AST 24  ALT 20  ALKPHOS 99  BILITOT 0.4   ------------------------------------------------------------------------------------------------------------------ estimated creatinine clearance  is 59.4 mL/min (by C-G formula based on SCr of 0.61 mg/dL). ------------------------------------------------------------------------------------------------------------------ No results for input(s): TSH, T4TOTAL, T3FREE, THYROIDAB in the last 72 hours.  Invalid input(s): FREET3   Coagulation profile  Recent Labs Lab 01/24/16 1124  INR 0.92   ------------------------------------------------------------------------------------------------------------------- No results for input(s): DDIMER in the last 72 hours. -------------------------------------------------------------------------------------------------------------------  Cardiac Enzymes  Recent Labs Lab 01/24/16 1124  TROPONINI <0.03   ------------------------------------------------------------------------------------------------------------------ Invalid input(s): POCBNP  ---------------------------------------------------------------------------------------------------------------  Urinalysis    Component Value Date/Time   COLORURINE YELLOW (A) 11/25/2015 0726   APPEARANCEUR CLEAR (A) 11/25/2015 0726   APPEARANCEUR Clear 03/19/2014 1738   LABSPEC 1.017 11/25/2015 0726   LABSPEC 1.018 03/19/2014 1738   PHURINE 5.0 11/25/2015 0726   GLUCOSEU >500 (A) 11/25/2015 0726   GLUCOSEU Negative 03/19/2014 1738   HGBUR NEGATIVE 11/25/2015 0726   BILIRUBINUR NEGATIVE 11/25/2015 0726   BILIRUBINUR Negative 03/19/2014 1738   KETONESUR TRACE (A) 11/25/2015 0726   PROTEINUR 100 (A) 11/25/2015 0726   NITRITE NEGATIVE 11/25/2015 0726   LEUKOCYTESUR NEGATIVE 11/25/2015 0726   LEUKOCYTESUR Negative 03/19/2014 1738     RADIOLOGY: Dg Chest Port 1 View  Result Date: 01/24/2016 CLINICAL DATA:  Chest pain EXAM:  PORTABLE CHEST 1 VIEW COMPARISON:  11/25/2015 chest radiograph. FINDINGS: Sternotomy wires appear aligned and intact. CABG clips overlie the mediastinum. Coronary stent overlies the left heart. Stable cardiomediastinal  silhouette with top-normal heart size. No pneumothorax. No pleural effusion. Lungs appear clear, with no acute consolidative airspace disease and no pulmonary edema. IMPRESSION: No active disease. Electronically Signed   By: Delbert Phenix M.D.   On: 01/24/2016 11:49    EKG: Orders placed or performed during the hospital encounter of 01/24/16  . EKG 12-Lead  . EKG 12-Lead    IMPRESSION AND PLAN: Patient is a 75 year old presenting with chest pain noted to have A. fib with RVR 1. A. fib with RVR Unclear if patient developed an allergic reaction to Cardizem in the ED, I will start patient on oral metoprolol Due to heart rate being in the 130s we'll start her on amiodarone Due to interaction with amiodarone and predexa,  I will switch to Xarelto I will ask her cardiologist from [facility] clinic to see her  2. Essential hypertension Continue metoprolol and hydralazine  3. GERD I will continue Protonix  4. Miscellaneous patient will be on Xarelto           All the records are reviewed and case discussed with ED provider. Management plans discussed with the patient, family and they are in agreement.  CODE STATUS:    Code Status Orders        Start     Ordered   01/24/16 1401  Full code  Continuous     01/24/16 1400    Code Status History    Date Active Date Inactive Code Status Order ID Comments User Context   11/25/2015  3:40 PM 11/26/2015  3:49 PM Full Code [id number]  Houston Siren, MD Inpatient   11/05/2015 12:33 AM 11/06/2015  6:05 PM Full Code [id number]  Oralia Manis, MD Inpatient   09/13/2015 10:10 AM 09/15/2015  7:33 PM Full Code [id number]  Altamese Dilling, MD Inpatient       TOTAL TIME TAKING CARE OF THIS PATIENT: 55 minutes.    Auburn Bilberry M.D on 01/24/2016 at 2:06 PM  Between 7am to 6pm - Pager - [phone]  After 6pm go to [email or web address] - password [email or web address] [facility]  [facility]  Office  [phone]  CC: Primary care physician;  Leanna Sato, MD

## 2016-01-24 NOTE — ED Notes (Signed)
Will give 1/2 of lopressor dose at this time. Concerned about dropping BP too quickly. Will wait 15-20 minutes and reassess BP, and give the rest of the dose if needed.

## 2016-01-24 NOTE — Progress Notes (Signed)
Patient admitted to unit. Oriented to room, call bell, and staff. Bed in lowest position. Fall safety plan reviewed. Full assessment to Epic. Skin assessment verified with Cammy Copa RN. Telemetry box verification with tele clerk and Forensic scientist- Box#: --[id number]. Will continue to monitor. Patient scores moderate plus but refuses bed alarm. Educated on safety and agrees to call for assistance before getting out of bed.   Dr. Lady Gary rounding - MD to cancel order for amio drip.

## 2016-01-25 LAB — CBC
HCT: 38.8 % (ref 35.0–47.0)
HEMOGLOBIN: 12.7 g/dL (ref 12.0–16.0)
MCH: 27.9 pg (ref 26.0–34.0)
MCHC: 32.6 g/dL (ref 32.0–36.0)
MCV: 85.6 fL (ref 80.0–100.0)
Platelets: 227 10*3/uL (ref 150–440)
RBC: 4.54 MIL/uL (ref 3.80–5.20)
RDW: 14.6 % — ABNORMAL HIGH (ref 11.5–14.5)
WBC: 8.9 10*3/uL (ref 3.6–11.0)

## 2016-01-25 LAB — BASIC METABOLIC PANEL
Anion gap: 5 (ref 5–15)
BUN: 24 mg/dL — ABNORMAL HIGH (ref 6–20)
CHLORIDE: 109 mmol/L (ref 101–111)
CO2: 28 mmol/L (ref 22–32)
Calcium: 8.6 mg/dL — ABNORMAL LOW (ref 8.9–10.3)
Creatinine, Ser: 0.63 mg/dL (ref 0.44–1.00)
GFR calc non Af Amer: >60 mL/min (ref >60)
Glucose, Bld: 186 mg/dL — ABNORMAL HIGH (ref 65–99)
POTASSIUM: 4 mmol/L (ref 3.5–5.1)
SODIUM: 142 mmol/L (ref 135–145)

## 2016-01-25 LAB — TROPONIN I

## 2016-01-25 MED ORDER — RIVAROXABAN 20 MG PO TABS: 20.0000 mg | ORAL_TABLET | Freq: Every day | ORAL | 0 refills | Status: DC

## 2016-01-25 MED ORDER — DILTIAZEM HCL ER COATED BEADS 180 MG PO CP24: 180.0000 mg | ORAL_CAPSULE | Freq: Every day | ORAL | 0 refills | Status: DC

## 2016-01-25 MED ORDER — NITROGLYCERIN 0.4 MG SL SUBL: 0.4000 mg | SUBLINGUAL_TABLET | SUBLINGUAL | 0 refills | Status: AC | PRN

## 2016-01-25 MED ORDER — METOPROLOL TARTRATE 25 MG PO TABS: 25.0000 mg | ORAL_TABLET | Freq: Two times a day (BID) | ORAL | 0 refills | Status: DC

## 2016-01-25 MED ORDER — HYDRALAZINE HCL 25 MG PO TABS: 25.0000 mg | ORAL_TABLET | Freq: Two times a day (BID) | ORAL | Status: DC

## 2016-01-25 MED ORDER — DILTIAZEM HCL ER COATED BEADS 180 MG PO CP24
180.0000 mg | ORAL_CAPSULE | Freq: Every day | ORAL | Status: DC
  Administered 2016-01-25: 180 mg via ORAL
  Filled 2016-01-25: qty 1

## 2016-01-25 MED ORDER — ACETAMINOPHEN 325 MG PO TABS
650.0000 mg | ORAL_TABLET | Freq: Four times a day (QID) | ORAL | Status: DC | PRN
  Administered 2016-01-25: 650 mg via ORAL
  Filled 2016-01-25: qty 2

## 2016-01-25 MED ORDER — METOPROLOL TARTRATE 25 MG PO TABS
25.0000 mg | ORAL_TABLET | Freq: Two times a day (BID) | ORAL | Status: DC
  Administered 2016-01-25: 25 mg via ORAL
  Filled 2016-01-25: qty 1

## 2016-01-25 MED ORDER — LISINOPRIL 20 MG PO TABS: 20.0000 mg | ORAL_TABLET | Freq: Every day | ORAL | 0 refills | Status: DC

## 2016-01-25 NOTE — Discharge Summary (Signed)
[facility] - [place] at [facility]   PATIENT NAME: Robin [name]    MR#:  [id number]  DATE OF BIRTH:  May 27, 1940  DATE OF ADMISSION:  01/24/2016 ADMITTING PHYSICIAN: Auburn Bilberry, MD  DATE OF DISCHARGE: 01/25/2016  PRIMARY CARE PHYSICIAN: Leanna Sato, MD    ADMISSION DIAGNOSIS:  chest pain  DISCHARGE DIAGNOSIS:  Active Problems:   A-fib (HCC)   SECONDARY DIAGNOSIS:   Past Medical History:  Diagnosis Date  . Afib (HCC)   . Anxiety   . CHF (congestive heart failure) (HCC)   . Coronary artery disease   . Diabetes mellitus without complication (HCC)   . Hyperlipidemia   . Hypertension     HOSPITAL COURSE:   75 year old female with a history of atrial fibrillation not complaining medications he presented with atrial fibrillation RVR. For further details please further H&P.  1. Atrial fibrillation with RVR: Heart rates have been controlled. Patient is not compliant medications. I spoke with patient in length about remaining compliant with medications. Neck supple was evaluated by cardiology. Next time patient will continue on diltiazem and metoprolol which in the past has controlled her heart rate. She will also continue on anticoagulation with XARELTO.  2. Essential hypertension: Patient will continue on metoprolol and diltiazem and lisinopril. Hydralazine has been discontinued to allow increased dose of diltiazem.  3. GERD: Continue PPI  4. Noncompliance: Patient is encouraged to be compliant with medications. She will need home health at discharge.  DISCHARGE CONDITIONS AND DIET:  Stable on Heart healthy diet  CONSULTS OBTAINED:  Treatment Team:  Dalia Heading, MD  DRUG ALLERGIES:  No Known Allergies  DISCHARGE MEDICATIONS:   Current Discharge Medication List    START taking these medications   Details  diltiazem (CARDIZEM CD) 180 MG 24 hr capsule Take 1 capsule (180 mg total) by mouth daily. Qty: 30 capsule, Refills: 0     metoprolol tartrate (LOPRESSOR) 25 MG tablet Take 1 tablet (25 mg total) by mouth 2 (two) times daily. Qty: 30 tablet, Refills: 0      CONTINUE these medications which have CHANGED   Details  lisinopril (PRINIVIL,ZESTRIL) 20 MG tablet Take 1 tablet (20 mg total) by mouth daily. Qty: 30 tablet, Refills: 0    nitroGLYCERIN (NITROSTAT) 0.4 MG SL tablet Place 1 tablet (0.4 mg total) under the tongue every 5 (five) minutes as needed for chest pain. Qty: 30 tablet, Refills: 0    rivaroxaban (XARELTO) 20 MG TABS tablet Take 1 tablet (20 mg total) by mouth daily. Qty: 30 tablet, Refills: 0      CONTINUE these medications which have NOT CHANGED   Details  albuterol (ACCUNEB) 1.25 MG/3ML nebulizer solution Take 1.25 mg by nebulization every 4 (four) hours as needed for wheezing or shortness of breath.     meclizine (ANTIVERT) 25 MG tablet Take 1 tablet (25 mg total) by mouth 3 (three) times daily as needed for dizziness. Qty: 30 tablet, Refills: 0    ondansetron (ZOFRAN ODT) 4 MG disintegrating tablet Take 1 tablet (4 mg total) by mouth every 8 (eight) hours as needed for nausea or vomiting. Qty: 20 tablet, Refills: 0    pantoprazole (PROTONIX) 40 MG tablet Take 1 tablet (40 mg total) by mouth daily. Qty: 30 tablet, Refills: 2      STOP taking these medications     hydrALAZINE (APRESOLINE) 25 MG tablet      metoprolol succinate (TOPROL-XL) 100 MG 24 hr tablet  atorvastatin (LIPITOR) 80 MG tablet      carisoprodol (SOMA) 350 MG tablet      furosemide (LASIX) 20 MG tablet      LORazepam (ATIVAN) 0.5 MG tablet      metFORMIN (GLUCOPHAGE) 500 MG tablet               Today   CHIEF COMPLAINT:   Patient is doing well this morning. She is very anxious however. She states she does not want to take her medications because they often cause a lot of problems.   VITAL SIGNS:  Blood pressure 140/73, pulse 83, temperature 97.7 F (36.5 C), temperature source Oral, resp.  rate 20, height 5\' 4"  (1.626 m), weight 72.6 kg (160 lb), SpO2 98 %.   REVIEW OF SYSTEMS:  Review of Systems  Constitutional: Negative.  Negative for chills, fever and malaise/fatigue.  HENT: Negative.  Negative for ear discharge, ear pain, hearing loss, nosebleeds and sore throat.   Eyes: Negative.  Negative for blurred vision and pain.  Respiratory: Negative.  Negative for cough, hemoptysis, shortness of breath and wheezing.   Cardiovascular: Negative.  Negative for chest pain, palpitations and leg swelling.  Gastrointestinal: Negative.  Negative for abdominal pain, blood in stool, diarrhea, nausea and vomiting.  Genitourinary: Negative.  Negative for dysuria.  Musculoskeletal: Negative.  Negative for back pain.  Skin: Negative.   Neurological: Negative for dizziness, tremors, speech change, focal weakness, seizures and headaches.  Endo/Heme/Allergies: Negative.  Does not bruise/bleed easily.  Psychiatric/Behavioral: Negative for depression, hallucinations and suicidal ideas. The patient is nervous/anxious.      PHYSICAL EXAMINATION:  GENERAL:  75 y.o.-year-old patient lying in the bed with no acute distress.  NECK:  Supple, no jugular venous distention. No thyroid enlargement, no tenderness.  LUNGS: Normal breath sounds bilaterally, no wheezing, rales,rhonchi  No use of accessory muscles of respiration.  CARDIOVASCULAR: Irregular, irregular normal heart rate No murmurs, rubs, or gallops.  ABDOMEN: Soft, non-tender, non-distended. Bowel sounds present. No organomegaly or mass.  EXTREMITIES: No pedal edema, cyanosis, or clubbing.  PSYCHIATRIC: The patient is alert and oriented x 3. Very anxious SKIN: No obvious rash, lesion, or ulcer.   DATA REVIEW:   CBC  Recent Labs Lab 01/25/16 0208  WBC 8.9  HGB 12.7  HCT 38.8  PLT 227    Chemistries   Recent Labs Lab 01/24/16 1124 01/25/16 0208  NA 141 142  K 4.1 4.0  CL 107 109  CO2 26 28  GLUCOSE 182* 186*  BUN 24* 24*   CREATININE 0.61 0.63  CALCIUM 8.8* 8.6*  AST 24  --   ALT 20  --   ALKPHOS 99  --   BILITOT 0.4  --     Cardiac Enzymes  Recent Labs Lab 01/24/16 1427 01/24/16 2052 01/25/16 0208  TROPONINI <0.03 <0.03 <0.03    Microbiology Results  @[email or web address] @  RADIOLOGY:  Dg Chest Port 1 View  Result Date: 01/24/2016 CLINICAL DATA:  Chest pain EXAM: PORTABLE CHEST 1 VIEW COMPARISON:  11/25/2015 chest radiograph. FINDINGS: Sternotomy wires appear aligned and intact. CABG clips overlie the mediastinum. Coronary stent overlies the left heart. Stable cardiomediastinal silhouette with top-normal heart size. No pneumothorax. No pleural effusion. Lungs appear clear, with no acute consolidative airspace disease and no pulmonary edema. IMPRESSION: No active disease. Electronically Signed   By: Delbert Phenix M.D.   On: 01/24/2016 11:49      Management plans discussed with the patient and she is in agreement. Stable [facility]  Patient should follow up with pcp/cardiology  CODE STATUS:     Code Status Orders        Start     Ordered   01/24/16 1401  Full code  Continuous     01/24/16 1400    Code Status History    Date Active Date Inactive Code Status Order ID Comments User Context   11/25/2015  3:40 PM 11/26/2015  3:49 PM Full Code [id number]  Houston SirenVivek J Sainani, MD Inpatient   11/05/2015 12:33 AM 11/06/2015  6:05 PM Full Code [id number]  Oralia Manisavid Willis, MD Inpatient   09/13/2015 10:10 AM 09/15/2015  7:33 PM Full Code [id number]  Altamese DillingVaibhavkumar Vachhani, MD Inpatient      TOTAL TIME TAKING CARE OF THIS PATIENT: 38 minutes.    Note: This dictation was prepared with Dragon dictation along with smaller phrase technology. Any transcriptional errors that result from this process are unintentional.  Francyne Arreaga M.D on 01/25/2016 at 12:01 PM  Between 7am to 6pm - Pager - [phone] After 6pm go to [email or web address] - password [email or web address]  [facility] [place] [facility]  Office   [phone]  CC: Primary care physician; Leanna SatoMILES,LINDA M, MD

## 2016-01-25 NOTE — Progress Notes (Addendum)
Patient alert and oriented, vss, no complaints of pain.   Patient has basic understanding of discharge medications and discharge planning.  HH RN will be set up at discharge to provide additional follow up reinforcement.   D/C telemetry per MD.   Patient's ride will most likely not be here until 1630-1700

## 2016-01-25 NOTE — Care Management (Signed)
There is concern that patient is not taking her medications correctly because she is not able to read the lables.  she lives with "her roommate" and he does not know she can not read and she is too ashamed to let him know. Agency preference is [facility].   Referral to [facility] SN SW and aide and accepted.  Patient will be seen 11/17.  She is current with her PCP and  her prescriptons " are packaged at the clinic.

## 2016-01-25 NOTE — Discharge Instructions (Signed)
Information on my medicine - XARELTO (Rivaroxaban)  This medication education was reviewed with me or my healthcare representative as part of my discharge preparation.  The pharmacist that spoke with me during my hospital stay was:  Martyn Malay, [facility]  Why was Xarelto prescribed for you? Xarelto was prescribed for you to reduce the risk of a blood clot forming that can cause a stroke if you have a medical condition called atrial fibrillation (a type of irregular heartbeat).  What do you need to know about xarelto ? Take your Xarelto ONCE DAILY at the same time every day with your evening meal. If you have difficulty swallowing the tablet whole, you may crush it and mix in applesauce just prior to taking your dose.  Take Xarelto exactly as prescribed by your doctor and DO NOT stop taking Xarelto without talking to the doctor who prescribed the medication.  Stopping without other stroke prevention medication to take the place of Xarelto may increase your risk of developing a clot that causes a stroke.  Refill your prescription before you run out.  After discharge, you should have regular check-up appointments with your healthcare provider that is prescribing your Xarelto.  In the future your dose may need to be changed if your kidney function or weight changes by a significant amount.  What do you do if you miss a dose? If you are taking Xarelto ONCE DAILY and you miss a dose, take it as soon as you remember on the same day then continue your regularly scheduled once daily regimen the next day. Do not take two doses of Xarelto at the same time or on the same day.   Important Safety Information A possible side effect of Xarelto is bleeding. You should call your healthcare provider right away if you experience any of the following: ? Bleeding from an injury or your nose that does not stop. ? Unusual colored urine (red or dark brown) or unusual colored stools (red or black). ? Unusual  bruising for unknown reasons. ? A serious fall or if you hit your head (even if there is no bleeding).  Some medicines may interact with Xarelto and might increase your risk of bleeding while on Xarelto. To help avoid this, consult your healthcare provider or pharmacist prior to using any new prescription or non-prescription medications, including herbals, vitamins, non-steroidal anti-inflammatory drugs (NSAIDs) and supplements.  This website has more information on Xarelto: [email or web address].

## 2016-02-06 ENCOUNTER — Emergency Department: Payer: Medicare Other

## 2016-02-06 ENCOUNTER — Inpatient Hospital Stay
Admission: EM | Admit: 2016-02-06 | Discharge: 2016-02-10 | DRG: 698 | Disposition: A | Discharge status: Home Health | Payer: Medicare Other | Attending: Internal Medicine | Admitting: Internal Medicine

## 2016-02-06 DIAGNOSIS — Z9114 Patient's other noncompliance with medication regimen: Secondary | ICD-10-CM

## 2016-02-06 DIAGNOSIS — F419 Anxiety disorder, unspecified: Secondary | ICD-10-CM | POA: Diagnosis present

## 2016-02-06 DIAGNOSIS — I482 Chronic atrial fibrillation: Secondary | ICD-10-CM | POA: Diagnosis present

## 2016-02-06 DIAGNOSIS — I160 Hypertensive urgency: Secondary | ICD-10-CM | POA: Diagnosis present

## 2016-02-06 DIAGNOSIS — Z8249 Family history of ischemic heart disease and other diseases of the circulatory system: Secondary | ICD-10-CM

## 2016-02-06 DIAGNOSIS — Z7901 Long term (current) use of anticoagulants: Secondary | ICD-10-CM | POA: Diagnosis not present

## 2016-02-06 DIAGNOSIS — R609 Edema, unspecified: Secondary | ICD-10-CM

## 2016-02-06 DIAGNOSIS — E119 Type 2 diabetes mellitus without complications: Secondary | ICD-10-CM | POA: Diagnosis present

## 2016-02-06 DIAGNOSIS — D72829 Elevated white blood cell count, unspecified: Secondary | ICD-10-CM | POA: Diagnosis present

## 2016-02-06 DIAGNOSIS — K551 Chronic vascular disorders of intestine: Secondary | ICD-10-CM | POA: Diagnosis present

## 2016-02-06 DIAGNOSIS — Z9119 Patient's noncompliance with other medical treatment and regimen: Secondary | ICD-10-CM | POA: Diagnosis not present

## 2016-02-06 DIAGNOSIS — R1010 Upper abdominal pain, unspecified: Secondary | ICD-10-CM | POA: Diagnosis not present

## 2016-02-06 DIAGNOSIS — Z79899 Other long term (current) drug therapy: Secondary | ICD-10-CM | POA: Diagnosis not present

## 2016-02-06 DIAGNOSIS — K219 Gastro-esophageal reflux disease without esophagitis: Secondary | ICD-10-CM | POA: Diagnosis present

## 2016-02-06 DIAGNOSIS — I4891 Unspecified atrial fibrillation: Secondary | ICD-10-CM | POA: Diagnosis not present

## 2016-02-06 DIAGNOSIS — R262 Difficulty in walking, not elsewhere classified: Secondary | ICD-10-CM

## 2016-02-06 DIAGNOSIS — I2510 Atherosclerotic heart disease of native coronary artery without angina pectoris: Secondary | ICD-10-CM | POA: Diagnosis present

## 2016-02-06 DIAGNOSIS — G9341 Metabolic encephalopathy: Secondary | ICD-10-CM | POA: Diagnosis present

## 2016-02-06 DIAGNOSIS — Z91199 Patient's noncompliance with other medical treatment and regimen due to unspecified reason: Secondary | ICD-10-CM

## 2016-02-06 DIAGNOSIS — K559 Vascular disorder of intestine, unspecified: Secondary | ICD-10-CM | POA: Diagnosis not present

## 2016-02-06 DIAGNOSIS — Z951 Presence of aortocoronary bypass graft: Secondary | ICD-10-CM

## 2016-02-06 DIAGNOSIS — I774 Celiac artery compression syndrome: Secondary | ICD-10-CM | POA: Diagnosis present

## 2016-02-06 DIAGNOSIS — E785 Hyperlipidemia, unspecified: Secondary | ICD-10-CM | POA: Diagnosis present

## 2016-02-06 DIAGNOSIS — N280 Ischemia and infarction of kidney: Secondary | ICD-10-CM | POA: Diagnosis not present

## 2016-02-06 DIAGNOSIS — N2889 Other specified disorders of kidney and ureter: Secondary | ICD-10-CM | POA: Diagnosis present

## 2016-02-06 DIAGNOSIS — M6281 Muscle weakness (generalized): Secondary | ICD-10-CM

## 2016-02-06 DIAGNOSIS — R109 Unspecified abdominal pain: Secondary | ICD-10-CM

## 2016-02-06 HISTORY — DX: Unspecified asthma, uncomplicated: J45.909

## 2016-02-06 LAB — CBC WITH DIFFERENTIAL/PLATELET
BASOS ABS: 0.1 10*3/uL (ref 0–0.1)
BASOS PCT: 0 %
Eosinophils Absolute: 0.1 10*3/uL (ref 0–0.7)
Eosinophils Relative: 0 %
HEMATOCRIT: 37.1 % (ref 35.0–47.0)
Hemoglobin: 12.4 g/dL (ref 12.0–16.0)
LYMPHS PCT: 8 %
Lymphs Abs: 1.5 10*3/uL (ref 1.0–3.6)
MCH: 28.1 pg (ref 26.0–34.0)
MCHC: 33.5 g/dL (ref 32.0–36.0)
MCV: 83.8 fL (ref 80.0–100.0)
Monocytes Absolute: 0.8 10*3/uL (ref 0.2–0.9)
Monocytes Relative: 4 %
NEUTROS ABS: 16.8 10*3/uL — AB (ref 1.4–6.5)
NEUTROS PCT: 88 %
Platelets: 223 10*3/uL (ref 150–440)
RBC: 4.43 MIL/uL (ref 3.80–5.20)
RDW: 14.6 % — AB (ref 11.5–14.5)
WBC: 19.3 10*3/uL — AB (ref 3.6–11.0)

## 2016-02-06 LAB — COMPREHENSIVE METABOLIC PANEL
ALT: 30 U/L (ref 14–54)
ANION GAP: 8 (ref 5–15)
AST: 26 U/L (ref 15–41)
Albumin: 3.5 g/dL (ref 3.5–5.0)
Alkaline Phosphatase: 130 U/L — ABNORMAL HIGH (ref 38–126)
BILIRUBIN TOTAL: 0.2 mg/dL — AB (ref 0.3–1.2)
BUN: 20 mg/dL (ref 6–20)
CHLORIDE: 109 mmol/L (ref 101–111)
CO2: 23 mmol/L (ref 22–32)
Calcium: 8.2 mg/dL — ABNORMAL LOW (ref 8.9–10.3)
Creatinine, Ser: 0.52 mg/dL (ref 0.44–1.00)
Glucose, Bld: 236 mg/dL — ABNORMAL HIGH (ref 65–99)
POTASSIUM: 3.6 mmol/L (ref 3.5–5.1)
Sodium: 140 mmol/L (ref 135–145)
TOTAL PROTEIN: 6.7 g/dL (ref 6.5–8.1)

## 2016-02-06 LAB — URINALYSIS COMPLETE WITH MICROSCOPIC (ARMC ONLY)
BACTERIA UA: NONE SEEN
BILIRUBIN URINE: NEGATIVE
HGB URINE DIPSTICK: NEGATIVE
LEUKOCYTES UA: NEGATIVE
NITRITE: NEGATIVE
PH: 7 (ref 5.0–8.0)
Protein, ur: 100 mg/dL — AB
Specific Gravity, Urine: 1.009 (ref 1.005–1.030)

## 2016-02-06 LAB — LIPASE, BLOOD: LIPASE: 18 U/L (ref 11–51)

## 2016-02-06 LAB — HEPARIN LEVEL (UNFRACTIONATED): Heparin Unfractionated: <0.1 IU/mL — ABNORMAL LOW (ref 0.30–0.70)

## 2016-02-06 LAB — BRAIN NATRIURETIC PEPTIDE: B NATRIURETIC PEPTIDE 5: 274 pg/mL — AB (ref 0.0–100.0)

## 2016-02-06 LAB — PROTIME-INR
INR: 0.99
PROTHROMBIN TIME: 13.1 s (ref 11.4–15.2)

## 2016-02-06 LAB — TROPONIN I: Troponin I: <0.03 ng/mL (ref <0.03)

## 2016-02-06 MED ORDER — FENTANYL CITRATE (PF) 100 MCG/2ML IJ SOLN
50.0000 ug | Freq: Once | INTRAMUSCULAR | Status: AC
  Administered 2016-02-06: 50 ug via INTRAVENOUS
  Filled 2016-02-06: qty 2

## 2016-02-06 MED ORDER — DILTIAZEM HCL 25 MG/5ML IV SOLN
15.0000 mg | Freq: Once | INTRAVENOUS | Status: AC
  Administered 2016-02-06: 15 mg via INTRAVENOUS
  Filled 2016-02-06: qty 5

## 2016-02-06 MED ORDER — IOPAMIDOL (ISOVUE-300) INJECTION 61%
100.0000 mL | Freq: Once | INTRAVENOUS | Status: AC | PRN
  Administered 2016-02-06: 100 mL via INTRAVENOUS

## 2016-02-06 MED ORDER — LORAZEPAM 2 MG/ML IJ SOLN: 1.0000 mg | Freq: Once | INTRAMUSCULAR | Status: DC

## 2016-02-06 MED ORDER — IPRATROPIUM-ALBUTEROL 0.5-2.5 (3) MG/3ML IN SOLN: 3.0000 mL | RESPIRATORY_TRACT | Status: DC | PRN

## 2016-02-06 MED ORDER — OXYCODONE HCL 5 MG PO TABS
5.0000 mg | ORAL_TABLET | ORAL | Status: DC | PRN
  Administered 2016-02-07 (×2): 5 mg via ORAL
  Filled 2016-02-06 (×2): qty 1

## 2016-02-06 MED ORDER — LORAZEPAM 2 MG/ML IJ SOLN
0.5000 mg | Freq: Once | INTRAMUSCULAR | Status: AC
  Administered 2016-02-06: 0.5 mg via INTRAVENOUS
  Filled 2016-02-06: qty 1

## 2016-02-06 MED ORDER — METOPROLOL TARTRATE 25 MG PO TABS
25.0000 mg | ORAL_TABLET | Freq: Two times a day (BID) | ORAL | Status: DC
  Administered 2016-02-06: 25 mg via ORAL
  Filled 2016-02-06: qty 1

## 2016-02-06 MED ORDER — ACETAMINOPHEN 325 MG PO TABS: 650.0000 mg | ORAL_TABLET | Freq: Four times a day (QID) | ORAL | Status: DC | PRN

## 2016-02-06 MED ORDER — HEPARIN (PORCINE) IN NACL 100-0.45 UNIT/ML-% IJ SOLN
1450.0000 [IU]/h | INTRAMUSCULAR | Status: DC
  Administered 2016-02-06 – 2016-02-07 (×2): 1100 [IU]/h via INTRAVENOUS
  Administered 2016-02-08: 1150 [IU]/h via INTRAVENOUS
  Administered 2016-02-09: 1300 [IU]/h via INTRAVENOUS
  Filled 2016-02-06 (×4): qty 250

## 2016-02-06 MED ORDER — IOPAMIDOL (ISOVUE-300) INJECTION 61%
30.0000 mL | Freq: Once | INTRAVENOUS | Status: AC
  Administered 2016-02-06: 30 mL via ORAL

## 2016-02-06 MED ORDER — MORPHINE SULFATE (PF) 4 MG/ML IV SOLN
4.0000 mg | Freq: Once | INTRAVENOUS | Status: AC
  Administered 2016-02-06: 4 mg via INTRAVENOUS
  Filled 2016-02-06: qty 1

## 2016-02-06 MED ORDER — DILTIAZEM HCL ER COATED BEADS 180 MG PO CP24
180.0000 mg | ORAL_CAPSULE | Freq: Every day | ORAL | Status: DC
  Administered 2016-02-07: 180 mg via ORAL
  Filled 2016-02-06: qty 1

## 2016-02-06 MED ORDER — ONDANSETRON 4 MG PO TBDP
8.0000 mg | ORAL_TABLET | Freq: Once | ORAL | Status: AC
  Administered 2016-02-06: 8 mg via ORAL
  Filled 2016-02-06: qty 2

## 2016-02-06 MED ORDER — HYDRALAZINE HCL 20 MG/ML IJ SOLN
10.0000 mg | INTRAMUSCULAR | Status: DC | PRN
  Administered 2016-02-06 – 2016-02-07 (×2): 10 mg via INTRAVENOUS
  Filled 2016-02-06: qty 1

## 2016-02-06 MED ORDER — MORPHINE SULFATE (PF) 4 MG/ML IV SOLN
2.0000 mg | INTRAVENOUS | Status: DC | PRN
  Administered 2016-02-06 – 2016-02-07 (×2): 2 mg via INTRAVENOUS
  Filled 2016-02-06 (×2): qty 1

## 2016-02-06 MED ORDER — ONDANSETRON HCL 4 MG PO TABS: 4.0000 mg | ORAL_TABLET | Freq: Four times a day (QID) | ORAL | Status: DC | PRN

## 2016-02-06 MED ORDER — ONDANSETRON HCL 4 MG/2ML IJ SOLN
4.0000 mg | Freq: Four times a day (QID) | INTRAMUSCULAR | Status: DC | PRN
  Administered 2016-02-06 – 2016-02-08 (×2): 4 mg via INTRAVENOUS
  Filled 2016-02-06 (×2): qty 2

## 2016-02-06 MED ORDER — NITROGLYCERIN 0.4 MG SL SUBL: 0.4000 mg | SUBLINGUAL_TABLET | SUBLINGUAL | Status: DC | PRN

## 2016-02-06 MED ORDER — HYDRALAZINE HCL 20 MG/ML IJ SOLN
INTRAMUSCULAR | Status: AC
  Administered 2016-02-06: 10 mg via INTRAVENOUS
  Filled 2016-02-06: qty 1

## 2016-02-06 MED ORDER — ACETAMINOPHEN 650 MG RE SUPP: 650.0000 mg | Freq: Four times a day (QID) | RECTAL | Status: DC | PRN

## 2016-02-06 MED ORDER — LABETALOL HCL 5 MG/ML IV SOLN
INTRAVENOUS | Status: AC
  Administered 2016-02-06: 20 mg via INTRAVENOUS
  Filled 2016-02-06: qty 4

## 2016-02-06 MED ORDER — PANTOPRAZOLE SODIUM 40 MG PO TBEC
40.0000 mg | DELAYED_RELEASE_TABLET | Freq: Every day | ORAL | Status: DC
Start: 2016-02-06 — End: 2016-02-10
  Administered 2016-02-06 – 2016-02-10 (×5): 40 mg via ORAL
  Filled 2016-02-06 (×5): qty 1

## 2016-02-06 MED ORDER — SODIUM CHLORIDE 0.9 % IV BOLUS (SEPSIS)
500.0000 mL | Freq: Once | INTRAVENOUS | Status: AC
  Administered 2016-02-06: 500 mL via INTRAVENOUS

## 2016-02-06 MED ORDER — LABETALOL HCL 5 MG/ML IV SOLN
20.0000 mg | Freq: Once | INTRAVENOUS | Status: AC
  Administered 2016-02-06: 20 mg via INTRAVENOUS

## 2016-02-06 MED ORDER — SODIUM CHLORIDE 0.9% FLUSH
3.0000 mL | Freq: Two times a day (BID) | INTRAVENOUS | Status: DC
  Administered 2016-02-06 – 2016-02-09 (×7): 3 mL via INTRAVENOUS

## 2016-02-06 MED ORDER — LISINOPRIL 20 MG PO TABS: 20.0000 mg | ORAL_TABLET | Freq: Every day | ORAL | Status: DC

## 2016-02-06 NOTE — Progress Notes (Signed)
Arrival Method:  Via ED stretcher with ED RN Butch Mental Orientation: A&O Telemetry: [id number], verified by Florina Ou, NT Skin: Intact Verified by Antonieta Iba, RN IV: 22g right hand, 22g left hand Pain: 10 out of 10 stomach, will give IV morphine for pain Tubes: Heparin gtt @ 11. O2 @ 3L [place] Safety Measures: Safety Fall Prevention Plan has been given, discussed. non skid socks in place, bed alarm activated. 2A Orientation: Patient has been orientated to the room, unit & staff.   Orders have been reviewed & implemented. Will continue to monitor the patient. Call light has been placed within reach.  Eden Lathe, RN

## 2016-02-06 NOTE — ED Provider Notes (Addendum)
[facility] Emergency Department Provider Note  ____________________________________________   I have reviewed the triage vital signs and the nursing notes.   HISTORY  Chief Complaint Nausea and Diarrhea    HPI Robin [name] is a 75 y.o. female with a history of atrial fibrillation, anxiety, noncompliance, who is discharge recent and Xarelto but states she is not taking a presents today with right-sided abdominal pain and flank pain for the last several days. She denies fever or chills. Today she began to have vomiting and diarrhea and she came in. Is unclear she is taking her other medications. She seems to indicate on this. In any event, she denies any chest pain or short of breath although she states she has a cough. She denies any melena or bright red blood per rectum or hematemesis. The pain comes and goes. Nothing makes it better nothing makes it worse. She is a very vague historian. She is very anxious and upset.      Past Medical History:  Diagnosis Date  . Afib (HCC)   . Anxiety   . Asthma   . CHF (congestive heart failure) (HCC)   . Coronary artery disease   . Diabetes mellitus without complication (HCC)   . Hyperlipidemia   . Hypertension     Patient Active Problem List   Diagnosis Date Noted  . A-fib (HCC) 01/24/2016  . Chest pain 11/04/2015  . HTN (hypertension) 11/04/2015  . HLD (hyperlipidemia) 11/04/2015  . CAD (coronary artery disease) 11/04/2015  . Chronic systolic CHF (congestive heart failure) (HCC) 11/04/2015  . Diabetes (HCC) 11/04/2015  . Atrial fibrillation with RVR (HCC) 09/13/2015    Past Surgical History:  Procedure Laterality Date  . CORONARY ARTERY BYPASS GRAFT    . NO PAST SURGERIES      Prior to Admission medications   Medication Sig Start Date End Date Taking? Authorizing Provider  albuterol (ACCUNEB) 1.25 MG/3ML nebulizer solution Take 1.25 mg by nebulization every 4 (four) hours as needed for wheezing or  shortness of breath.     Historical Provider, MD  diltiazem (CARDIZEM CD) 180 MG 24 hr capsule Take 1 capsule (180 mg total) by mouth daily. 01/26/16   Adrian Saran, MD  lisinopril (PRINIVIL,ZESTRIL) 20 MG tablet Take 1 tablet (20 mg total) by mouth daily. 01/26/16   Adrian Saran, MD  meclizine (ANTIVERT) 25 MG tablet Take 1 tablet (25 mg total) by mouth 3 (three) times daily as needed for dizziness. 11/06/15   Enid Baas, MD  metoprolol tartrate (LOPRESSOR) 25 MG tablet Take 1 tablet (25 mg total) by mouth 2 (two) times daily. 01/25/16   Adrian Saran, MD  nitroGLYCERIN (NITROSTAT) 0.4 MG SL tablet Place 1 tablet (0.4 mg total) under the tongue every 5 (five) minutes as needed for chest pain. 01/25/16   Adrian Saran, MD  ondansetron (ZOFRAN ODT) 4 MG disintegrating tablet Take 1 tablet (4 mg total) by mouth every 8 (eight) hours as needed for nausea or vomiting. 11/06/15   Enid Baas, MD  pantoprazole (PROTONIX) 40 MG tablet Take 1 tablet (40 mg total) by mouth daily. Patient taking differently: Take 40 mg by mouth daily as needed. For acid reflux 11/06/15   Enid Baas, MD  rivaroxaban (XARELTO) 20 MG TABS tablet Take 1 tablet (20 mg total) by mouth daily. 01/25/16   Adrian Saran, MD    Allergies Patient has no known allergies.  Family History  Problem Relation Age of Onset  . Hypertension Mother   . Heart  failure Mother   . Heart disease Father     Social History Social History  Substance Use Topics  . Smoking status: Never Smoker  . Smokeless tobacco: Never Used  . Alcohol use No    Review of Systems Constitutional: No fever/chills Eyes: No visual changes. ENT: No sore throat. No stiff neck no neck pain Cardiovascular: Denies chest pain. Respiratory: Denies shortness of breath.Positive cough Gastrointestinal:  See history of present illness.  No constipation. Genitourinary: Negative for dysuria. Musculoskeletal: Negative lower extremity swelling Skin: Negative for  rash. Neurological: Negative for severe headaches, focal weakness or numbness. 10-point ROS otherwise negative.  ____________________________________________   PHYSICAL EXAM:  VITAL SIGNS: ED Triage Vitals  Enc Vitals Group     BP 02/06/16 1517 (!) 187/91     Pulse Rate 02/06/16 1515 (!) 109     Resp 02/06/16 1515 (!) 26     Temp 02/06/16 1517 97.9 F (36.6 C)     Temp src --      SpO2 02/06/16 1515 (!) 87 %     Weight 02/06/16 1508 160 lb (72.6 kg)     Height 02/06/16 1508 5\' 4"  (1.626 m)     Head Circumference --      Peak Flow --      Pain Score 02/06/16 1509 10     Pain Loc --      Pain Edu? --      Excl. in GC? --     Constitutional: Alert and oriented. Well appearing and in no acute distress. Eyes: Conjunctivae are normal. PERRL. EOMI. Head: Atraumatic. Nose: No congestion/rhinnorhea. Mouth/Throat: Mucous membranes are moist.  Oropharynx non-erythematous. Neck: No stridor.   Nontender with no meningismus Cardiovascular: Irregularly irregular and tachycardic . Grossly normal heart sounds.  Good peripheral circulation. Respiratory: Normal respiratory effort.  No retractions. Lungs CTAB. Abdominal: Soft and Mild epigastric tenderness. No distention. No guarding no rebound Back:  There is no focal tenderness or step off.  there is no midline tenderness there are no lesions noted. there is Right CVA tenderness Musculoskeletal: No lower extremity tenderness, no upper extremity tenderness. No joint effusions, no DVT signs strong distal pulses no edema Neurologic:  Normal speech and language. No gross focal neurologic deficits are appreciated.  Skin:  Skin is warm, dry and intact. No rash noted. Psychiatric: Mood and affect are very anxious. Speech and behavior are normal.  ____________________________________________   LABS (all labs ordered are listed, but only abnormal results are displayed)  Labs Reviewed  COMPREHENSIVE METABOLIC PANEL - Abnormal; Notable for the  following:       Result Value   Glucose, Bld 236 (*)    Calcium 8.2 (*)    Alkaline Phosphatase 130 (*)    Total Bilirubin 0.2 (*)    All other components within normal limits  CBC WITH DIFFERENTIAL/PLATELET - Abnormal; Notable for the following:    WBC 19.3 (*)    RDW 14.6 (*)    Neutro Abs 16.8 (*)    All other components within normal limits  BRAIN NATRIURETIC PEPTIDE - Abnormal; Notable for the following:    B Natriuretic Peptide 274.0 (*)    All other components within normal limits  URINALYSIS COMPLETEWITH MICROSCOPIC ([facility] ONLY) - Abnormal; Notable for the following:    Color, Urine STRAW (*)    APPearance CLEAR (*)    Glucose, UA >500 (*)    Ketones, ur TRACE (*)    Protein, ur 100 (*)    Squamous  Epithelial / LPF 0-5 (*)    All other components within normal limits  URINE CULTURE  LIPASE, BLOOD  TROPONIN I  PROTIME-INR   ____________________________________________  EKG  I personally interpreted any EKGs ordered by me or triage Intrafibrillation rate 117, chronic I the CD LAD and LVH ____________________________________________  RADIOLOGY  I reviewed any imaging ordered by me or triage that were performed during my shift and, if possible, patient and/or family made aware of any abnormal findings. ____________________________________________   PROCEDURES  Procedure(s) performed: None  Procedures  Critical Care performed: CRITICAL CARE Performed by: Jeanmarie Plant   Total critical care time: 45 minutes  Critical care time was exclusive of separately billable procedures and treating other patients.  Critical care was necessary to treat or prevent imminent or life-threatening deterioration.  Critical care was time spent personally by me on the following activities: development of treatment plan with patient and/or surrogate as well as nursing, discussions with consultants, evaluation of patient's response to treatment, examination of patient, obtaining  history from patient or surrogate, ordering and performing treatments and interventions, ordering and review of laboratory studies, ordering and review of radiographic studies, pulse oximetry and re-evaluation of patient's condition.   ____________________________________________   INITIAL IMPRESSION / ASSESSMENT AND PLAN / ED COURSE  Pertinent labs & imaging results that were available during my care of the patient were reviewed by me and considered in my medical decision making (see chart for details).  Patient with multiple symptoms. She is very anxious and upset. IV fluid seems to irritate her. Even normal saline. She states she is having vomiting and diarrhea today. She also points of a cough and right-sided flank pain. This is a lot to workup. Patient has a history of noncompliance. Ultimately, I did elect to do a CT scan. It is reported that the CT scan shows acute renal infarct second to clot. I've discussed with Dr. dew, who feels that given the time course of her symptoms, the best course of action will be anticoagulation. Patient states that she is not taking anticoagulation at this time. We will therefore start her on heparin I will admit her to the hospital. Her heart rate has been somewhat up. This is probably a combination of pain/anxiety/noncompliance. We'll give her Cardizem and anxiolytic medications. We have given her pain medications as well. Patient voices understanding of her findings and she will be admitted  Clinical Course    ____________________________________________   FINAL CLINICAL IMPRESSION(S) / ED DIAGNOSES  Final diagnoses:  Abdominal pain      This chart was dictated using voice recognition software.  Despite best efforts to proofread,  errors can occur which can change meaning.      Jeanmarie Plant, MD 02/06/16 1909    Jeanmarie Plant, MD 02/06/16 1910

## 2016-02-06 NOTE — ED Notes (Signed)
Patient transported to X-ray 

## 2016-02-06 NOTE — Progress Notes (Addendum)
ANTICOAGULATION CONSULT NOTE - Initial Consult  Pharmacy Consult for heparin drip Indication: renal infarct  No Known Allergies  Patient Measurements: Height: 5\' 4"  (162.6 cm) Weight: 160 lb (72.6 kg) IBW/kg (Calculated) : 54.7 Heparin Dosing Weight: 69 kg  Vital Signs: Temp: 97.9 F (36.6 C) (11/27 1517) BP: 190/94 (11/27 1913) Pulse Rate: 119 (11/27 1913)  Labs:  Recent Labs  02/06/16 1516  HGB 12.4  HCT 37.1  PLT 223  LABPROT 13.1  INR 0.99  CREATININE 0.52  TROPONINI <0.03    Estimated Creatinine Clearance: 59.4 mL/min (by C-G formula based on SCr of 0.52 mg/dL).   Medical History: Past Medical History:  Diagnosis Date  . Afib (HCC)   . Anxiety   . Asthma   . CHF (congestive heart failure) (HCC)   . Coronary artery disease   . Diabetes mellitus without complication (HCC)   . Hyperlipidemia   . Hypertension     Medications:   (Not in a hospital admission) Scheduled:  . sodium chloride flush  3 mL Intravenous Q12H   Infusions:  . 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  Patient was prescribed rivaroxaban prior to admission - unclear if she has been taking it. Patient has a history of noncompliance. She told the MD that she was not taking rivaroxaban, but reported to pharmacy that she took a dose this morning.   Baseline labs (CBC, INR, aPTT) have resulted. I have ordered add-on baseline heparin level.  Goal of Therapy:  Heparin level 0.3-0.7 units/ml Monitor platelets by anticoagulation protocol: Yes   Plan:  NO bolus since patient reports taking rivaroxaban this morning Start heparin infusion at 1100 units/hr Check anti-Xa level in 8 hours and daily while on heparin Continue to monitor H&H and platelets  Cindi Carbon, PharmD, BCPS Clinical Pharmacist 02/06/2016,7:36 PM   Addendum: Will  order aPTT and HL in 8 hours - will need to follow aPTT if baseline HL is elevated.  Jodelle Red [facility] 02/06/16 7:43 PM

## 2016-02-06 NOTE — ED Notes (Signed)
Pts bed and linens changed

## 2016-02-06 NOTE — ED Notes (Signed)
Pts daughter called to inquire about mother. Phone given to pt. Daughter Misty Stanley [phone]

## 2016-02-06 NOTE — ED Notes (Signed)
Pts RA sats 87%, crackles noted on auscultation

## 2016-02-06 NOTE — H&P (Signed)
[employer] [facility] [facility] [place] [facility]   PATIENT NAME: Robin [name]    MR#:  [id number]  DATE OF BIRTH:  09/08/1940   DATE OF ADMISSION:  02/06/2016  PRIMARY CARE PHYSICIAN: Leanna SatoMILES,LINDA M, MD   REQUESTING/REFERRING PHYSICIAN: McShane  CHIEF COMPLAINT:   Chief Complaint  Patient presents with  . Nausea  . Diarrhea    HISTORY OF PRESENT ILLNESS:  Robin [name]  is a 75 y.o. female with a known history of Atrial fibrillation who is presenting with "I got sick" She is unable to provide any more information she is remarkably vague with her symptoms Upon further questioning it does seem that she has shortness of breath but uncertain of the duration  She was noted to be tachycardic, hypertensive, found to have renal infarct on imaging  PAST MEDICAL HISTORY:   Past Medical History:  Diagnosis Date  . Afib (HCC)   . Anxiety   . Asthma   . CHF (congestive heart failure) (HCC)   . Coronary artery disease   . Diabetes mellitus without complication (HCC)   . Hyperlipidemia   . Hypertension     PAST SURGICAL HISTORY:   Past Surgical History:  Procedure Laterality Date  . CORONARY ARTERY BYPASS GRAFT    . NO PAST SURGERIES      SOCIAL HISTORY:   Social History  Substance Use Topics  . Smoking status: Never Smoker  . Smokeless tobacco: Never Used  . Alcohol use No    FAMILY HISTORY:   Family History  Problem Relation Age of Onset  . Hypertension Mother   . Heart failure Mother   . Heart disease Father     DRUG ALLERGIES:  No Known Allergies  REVIEW OF SYSTEMS:  Unreliable historian unable to obtain   MEDICATIONS AT HOME:   Prior to Admission medications   Medication Sig Start Date End Date Taking? Authorizing Provider  diltiazem (CARDIZEM CD) 180 MG 24 hr capsule Take 1 capsule (180 mg total) by mouth daily. 01/26/16  Yes Sital Mody, MD  lisinopril (PRINIVIL,ZESTRIL) 20 MG tablet Take 1 tablet (20 mg total) by mouth daily. 01/26/16   Yes Adrian SaranSital Mody, MD  metoprolol tartrate (LOPRESSOR) 25 MG tablet Take 1 tablet (25 mg total) by mouth 2 (two) times daily. 01/25/16  Yes Sital Mody, MD  pantoprazole (PROTONIX) 40 MG tablet Take 1 tablet (40 mg total) by mouth daily. Patient taking differently: Take 40 mg by mouth daily as needed. For acid reflux 11/06/15  Yes Enid Baasadhika Kalisetti, MD  rivaroxaban (XARELTO) 20 MG TABS tablet Take 1 tablet (20 mg total) by mouth daily. 01/25/16  Yes Adrian SaranSital Mody, MD  albuterol (ACCUNEB) 1.25 MG/3ML nebulizer solution Take 1.25 mg by nebulization every 4 (four) hours as needed for wheezing or shortness of breath.     Historical Provider, MD  meclizine (ANTIVERT) 25 MG tablet Take 1 tablet (25 mg total) by mouth 3 (three) times daily as needed for dizziness. 11/06/15   Enid Baasadhika Kalisetti, MD  nitroGLYCERIN (NITROSTAT) 0.4 MG SL tablet Place 1 tablet (0.4 mg total) under the tongue every 5 (five) minutes as needed for chest pain. 01/25/16   Adrian SaranSital Mody, MD  ondansetron (ZOFRAN ODT) 4 MG disintegrating tablet Take 1 tablet (4 mg total) by mouth every 8 (eight) hours as needed for nausea or vomiting. 11/06/15   Enid Baasadhika Kalisetti, MD      VITAL SIGNS:  Blood pressure (!) 190/94, pulse (!) 119, temperature 97.9 F (36.6 C), resp. rate  18, height 5\' 4"  (1.626 m), weight 72.6 kg (160 lb), SpO2 93 %.  PHYSICAL EXAMINATION:  VITAL SIGNS: Vitals:   02/06/16 1525 02/06/16 1913  BP:  (!) 190/94  Pulse: (!) 115 (!) 119  Resp: (!) 25 18  Temp:     GENERAL:75 y.o.female currently Ill-appearing HEAD: Normocephalic, atraumatic.  EYES: Pupils equal, round, reactive to light. Extraocular muscles intact. No scleral icterus.  MOUTH: Moist mucosal membrane. Dentition intact. No abscess noted.  EAR, NOSE, THROAT: Clear without exudates. No external lesions.  NECK: Supple. No thyromegaly. No nodules. No JVD.  PULMONARY: Basal crackles without wheeze No use of accessory muscles, Good respiratory effort. good air entry  bilaterally CHEST: Nontender to palpation.  CARDIOVASCULAR: S1 and S2. Irregular rate and rhythm. No murmurs, rubs, or gallops. No edema. Pedal pulses 2+ bilaterally.  GASTROINTESTINAL: Soft, nontender, nondistended. No masses. Positive bowel sounds. No hepatosplenomegaly.  MUSCULOSKELETAL: No swelling, clubbing, or edema. Range of motion full in all extremities.  NEUROLOGIC: Cranial nerves II through XII are intact. No gross focal neurological deficits. Sensation intact. Reflexes intact.  SKIN: No ulceration, lesions, rashes, or cyanosis. Skin warm and dry. Turgor intact.  PSYCHIATRIC: Somnolent but arousable Mood, affect flat. The patient is awake, alert and oriented x 1. Insight, judgment poor.    LABORATORY PANEL:   CBC  Recent Labs Lab 02/06/16 1516  WBC 19.3*  HGB 12.4  HCT 37.1  PLT 223   ------------------------------------------------------------------------------------------------------------------  Chemistries   Recent Labs Lab 02/06/16 1516  NA 140  K 3.6  CL 109  CO2 23  GLUCOSE 236*  BUN 20  CREATININE 0.52  CALCIUM 8.2*  AST 26  ALT 30  ALKPHOS 130*  BILITOT 0.2*   ------------------------------------------------------------------------------------------------------------------  Cardiac Enzymes  Recent Labs Lab 02/06/16 1516  TROPONINI <0.03   ------------------------------------------------------------------------------------------------------------------  RADIOLOGY:  Dg Chest 2 View  Result Date: 02/06/2016 CLINICAL DATA:  Chest pain starting 12 p.m. today EXAM: CHEST  2 VIEW COMPARISON:  01/24/2016 FINDINGS: Cardiomegaly is noted. No infiltrate or pleural effusion. No pulmonary edema. Osteopenia and mild degenerative change thoracic spine. Status post median sternotomy. IMPRESSION: No active cardiopulmonary disease. Cardiomegaly again noted. Status post median sternotomy. Electronically Signed   By: Natasha Mead M.D.   On: 02/06/2016 16:31    Ct Abdomen Pelvis W Contrast  Result Date: 02/06/2016 CLINICAL DATA:  Nausea, vomiting and diarrhea for 1 hour. Hypertensive. History of hypertension, diabetes. EXAM: CT ABDOMEN AND PELVIS WITH CONTRAST TECHNIQUE: Multidetector CT imaging of the abdomen and pelvis was performed using the standard protocol following bolus administration of intravenous contrast. CONTRAST:  ISOVUE-300 IOPAMIDOL (ISOVUE-300) INJECTION 61% COMPARISON:  Acute abdominal series February 06, 2016 at 1614 hours FINDINGS: LOWER CHEST: Small bilateral pleural effusions. Interlobular septal thickening most consistent with chronic CHF. The heart is at least mildly enlarged. No pericardial effusions. HEPATOBILIARY: The liver is diffusely hypodense compatible with steatosis with mild focal fatty sparing about the gallbladder fossa. Gallbladder is normal. PANCREAS: Normal. SPLEEN: Normal. ADRENALS/URINARY TRACT: Kidneys are orthotopic demonstrate normal enhancement. Hyper perfused RIGHT kidney with minimal enhancement on early phase, slightly increased on delayed phase. Thromboembolism RIGHT renal artery from the origin distally. RIGHT interpolar renal scarring. Too small to characterize hypodensity lower pole LEFT kidney. 3 mm nonobstructing RIGHT lower pole and 2 mm RIGHT upper pole nephrolithiasis. Urinary bladder is well distended and unremarkable. STOMACH/BOWEL: The stomach, small and large bowel are normal in course and caliber without inflammatory changes. Moderate sigmoid diverticulosis. Normal appendix. VASCULAR/LYMPHATIC: Aortoiliac  vessels are normal in course and caliber, severe atherosclerosis. 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 No lymphadenopathy by CT size criteria. REPRODUCTIVE: Normal. OTHER: Small amount of low-density free fluid in the pelvis. No focal fluid collection, no intraperitoneal free air. MUSCULOSKELETAL: Nonacute. Moderate degenerative change of LEFT hip. Grade 1 L5-S1  anterolisthesis on the basis of bilateral chronic L5 pars interarticularis defects. Anterior abdominal wall scarring with small fat containing umbilical hernia. 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 Severe atherosclerosis. 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 Colonic diverticulosis without acute diverticulitis. Small bilateral pleural effusions. Acute findings discussed with and reconfirmed by Dr.JAMES MCSHANE on 02/06/2016 at 6:55 pm. Electronically Signed   By: Awilda Metro M.D.   On: 02/06/2016 18:59   Dg Abd 2 Views  Result Date: 02/06/2016 CLINICAL DATA:  Initial evaluation for acute abdominal pain, fever. EXAM: ABDOMEN - 2 VIEW COMPARISON:  Prior radiograph from 05/07/2014. FINDINGS: Bowel gas pattern within normal limits without evidence for obstruction or ileus. No abnormal bowel wall thickening. No free air. No soft tissue mass or abnormal calcification. Median sternotomy wires noted. Scattered degenerative changes noted within the visualized spine. Degenerative changes noted about the hips bilaterally as well. IMPRESSION: Nonobstructive bowel gas pattern with no radiographic evidence for acute intra-abdominal or pelvic process. Electronically Signed   By: Rise Mu M.D.   On: 02/06/2016 16:33    EKG:   Orders placed or performed during the hospital encounter of 02/06/16  . EKG 12-Lead  . EKG 12-Lead    IMPRESSION AND PLAN:   75 year old Caucasian female history of A. fib presenting with A. fib with rapid ventricular response and abdominal pain.  1. Atrial fibrillation rapid ventricular response: Likely worsened by medication noncompliance and pain, received 1 dose IV Cardizem will give 1 dose IV labetalol now if required placed back on Cardizem drip 2. Hypertensive urgency: IV labetalol continue hydralazine as required 3. Renal infarct: Pain control, heparin case discussed with  vascular surgery 4. GERD without esophagitis PPI therapy   All the records are reviewed and case discussed with ED provider. Management plans discussed with the patient, family and they are in agreement.  CODE STATUS: Full  TOTAL TIME TAKING CARE OF THIS PATIENT: 40 critical care minutes.    Latarshia Jersey,  Mardi Mainland.D on 02/06/2016 at 7:43 PM  Between 7am to 6pm - Pager - [phone]  After 6pm: House Pager: - [phone]  [facility] [place] [facility]  Office  [phone]  CC: Primary care physician; Leanna Sato, MD

## 2016-02-06 NOTE — ED Triage Notes (Signed)
Pt from home via EMS, reports N/V/D x 1 hr, pt also hypertensive. Given 4 zofran by EMS

## 2016-02-07 ENCOUNTER — Inpatient Hospital Stay: Payer: Medicare Other

## 2016-02-07 DIAGNOSIS — Z91199 Patient's noncompliance with other medical treatment and regimen due to unspecified reason: Secondary | ICD-10-CM

## 2016-02-07 DIAGNOSIS — Z9119 Patient's noncompliance with other medical treatment and regimen: Secondary | ICD-10-CM

## 2016-02-07 LAB — BASIC METABOLIC PANEL
ANION GAP: 14 (ref 5–15)
BUN: 22 mg/dL — AB (ref 6–20)
CHLORIDE: 102 mmol/L (ref 101–111)
CO2: 22 mmol/L (ref 22–32)
Calcium: 8.7 mg/dL — ABNORMAL LOW (ref 8.9–10.3)
Creatinine, Ser: 1.01 mg/dL — ABNORMAL HIGH (ref 0.44–1.00)
GFR calc Af Amer: >60 mL/min (ref >60)
GFR, EST NON AFRICAN AMERICAN: 53 mL/min — AB (ref >60)
GLUCOSE: 404 mg/dL — AB (ref 65–99)
POTASSIUM: 4 mmol/L (ref 3.5–5.1)
Sodium: 138 mmol/L (ref 135–145)

## 2016-02-07 LAB — HEPARIN LEVEL (UNFRACTIONATED)
HEPARIN UNFRACTIONATED: 0.7 [IU]/mL (ref 0.30–0.70)
Heparin Unfractionated: 0.45 IU/mL (ref 0.30–0.70)
Heparin Unfractionated: 0.51 IU/mL (ref 0.30–0.70)

## 2016-02-07 LAB — GLUCOSE, CAPILLARY
GLUCOSE-CAPILLARY: 159 mg/dL — AB (ref 65–99)
Glucose-Capillary: 369 mg/dL — ABNORMAL HIGH (ref 65–99)
Glucose-Capillary: 288 mg/dL — ABNORMAL HIGH (ref 65–99)
Glucose-Capillary: 176 mg/dL — ABNORMAL HIGH (ref 65–99)

## 2016-02-07 LAB — CBC
HEMATOCRIT: 39.2 % (ref 35.0–47.0)
HEMOGLOBIN: 12.8 g/dL (ref 12.0–16.0)
MCH: 28.2 pg (ref 26.0–34.0)
MCHC: 32.8 g/dL (ref 32.0–36.0)
MCV: 86 fL (ref 80.0–100.0)
Platelets: 257 10*3/uL (ref 150–440)
RBC: 4.56 MIL/uL (ref 3.80–5.20)
RDW: 14.3 % (ref 11.5–14.5)
WBC: 17.6 10*3/uL — ABNORMAL HIGH (ref 3.6–11.0)

## 2016-02-07 LAB — APTT: APTT: 63 s — AB (ref 24–36)

## 2016-02-07 LAB — URINE CULTURE

## 2016-02-07 MED ORDER — INSULIN ASPART 100 UNIT/ML ~~LOC~~ SOLN
0.0000 [IU] | Freq: Three times a day (TID) | SUBCUTANEOUS | Status: DC
  Administered 2016-02-07: 8 [IU] via SUBCUTANEOUS
  Administered 2016-02-07: 15 [IU] via SUBCUTANEOUS
  Administered 2016-02-07: 3 [IU] via SUBCUTANEOUS
  Administered 2016-02-08: 5 [IU] via SUBCUTANEOUS
  Administered 2016-02-08: 15 [IU] via SUBCUTANEOUS
  Administered 2016-02-08: 8 [IU] via SUBCUTANEOUS
  Administered 2016-02-09: 5 [IU] via SUBCUTANEOUS
  Administered 2016-02-09: 8 [IU] via SUBCUTANEOUS
  Administered 2016-02-09 – 2016-02-10 (×2): 5 [IU] via SUBCUTANEOUS
  Administered 2016-02-10: 11 [IU] via SUBCUTANEOUS
  Filled 2016-02-07: qty 15
  Filled 2016-02-07 (×2): qty 5
  Filled 2016-02-07 (×2): qty 8
  Filled 2016-02-07 (×2): qty 5
  Filled 2016-02-07: qty 3
  Filled 2016-02-07: qty 15
  Filled 2016-02-07: qty 11
  Filled 2016-02-07: qty 8

## 2016-02-07 MED ORDER — HYDRALAZINE HCL 20 MG/ML IJ SOLN
20.0000 mg | Freq: Once | INTRAMUSCULAR | Status: AC
Start: 2016-02-07 — End: 2016-02-07
  Administered 2016-02-07: 20 mg via INTRAVENOUS
  Filled 2016-02-07: qty 1

## 2016-02-07 MED ORDER — OXYCODONE HCL 5 MG PO TABS: 5.0000 mg | ORAL_TABLET | ORAL | Status: DC | PRN

## 2016-02-07 MED ORDER — SODIUM CHLORIDE 0.9 % IV SOLN
INTRAVENOUS | Status: DC
  Administered 2016-02-07: 10:00:00 via INTRAVENOUS

## 2016-02-07 MED ORDER — DILTIAZEM HCL ER COATED BEADS 180 MG PO CP24
180.0000 mg | ORAL_CAPSULE | Freq: Two times a day (BID) | ORAL | Status: DC
  Administered 2016-02-07 – 2016-02-10 (×6): 180 mg via ORAL
  Filled 2016-02-07 (×8): qty 1

## 2016-02-07 MED ORDER — METOPROLOL TARTRATE 50 MG PO TABS
50.0000 mg | ORAL_TABLET | Freq: Four times a day (QID) | ORAL | Status: DC
  Administered 2016-02-07 – 2016-02-10 (×14): 50 mg via ORAL
  Filled 2016-02-07 (×14): qty 1

## 2016-02-07 MED ORDER — INSULIN ASPART 100 UNIT/ML ~~LOC~~ SOLN
0.0000 [IU] | Freq: Every day | SUBCUTANEOUS | Status: DC
  Administered 2016-02-08: 3 [IU] via SUBCUTANEOUS
  Filled 2016-02-07: qty 3

## 2016-02-07 NOTE — Progress Notes (Signed)
ANTICOAGULATION CONSULT NOTE - Initial Consult  Pharmacy Consult for heparin drip Indication: renal infarct  No Known Allergies  Patient Measurements: Height: 5\' 4"  (162.6 cm) Weight: 158 lb 3.2 oz (71.8 kg) IBW/kg (Calculated) : 54.7 Heparin Dosing Weight: 69 kg  Vital Signs: Temp: 98.7 F (37.1 C) (11/28 0426) Temp Source: Oral (11/28 0426) BP: 150/78 (11/28 0426) Pulse Rate: 116 (11/28 0426)  Labs:  Recent Labs  02/06/16 1516 02/06/16 2011 02/07/16 0556  HGB 12.4  --  12.8  HCT 37.1  --  39.2  PLT 223  --  257  APTT  --   --  63*  LABPROT 13.1  --   --   INR 0.99  --   --   HEPARINUNFRC  --  <0.10* 0.45  CREATININE 0.52  --  1.01*  TROPONINI <0.03  --   --     Estimated Creatinine Clearance: 46.7 mL/min (by C-G formula based on SCr of 1.01 mg/dL (H)).   Medical History: Past Medical History:  Diagnosis Date  . Afib (HCC)   . Anxiety   . Asthma   . CHF (congestive heart failure) (HCC)   . Coronary artery disease   . Diabetes mellitus without complication (HCC)   . Hyperlipidemia   . Hypertension     Medications:  Prescriptions Prior to Admission  Medication Sig Dispense Refill Last Dose  . diltiazem (CARDIZEM CD) 180 MG 24 hr capsule Take 1 capsule (180 mg total) by mouth daily. 30 capsule 0 02/06/2016 at 0800  . lisinopril (PRINIVIL,ZESTRIL) 20 MG tablet Take 1 tablet (20 mg total) by mouth daily. 30 tablet 0 02/06/2016 at 0800  . metoprolol tartrate (LOPRESSOR) 25 MG tablet Take 1 tablet (25 mg total) by mouth 2 (two) times daily. 30 tablet 0 02/06/2016 at 0800  . pantoprazole (PROTONIX) 40 MG tablet Take 1 tablet (40 mg total) by mouth daily. (Patient taking differently: Take 40 mg by mouth daily as needed. For acid reflux) 30 tablet 2 02/06/2016 at 0800  . rivaroxaban (XARELTO) 20 MG TABS tablet Take 1 tablet (20 mg total) by mouth daily. 30 tablet 0 02/06/2016 at 0800  . albuterol (ACCUNEB) 1.25 MG/3ML nebulizer solution Take 1.25 mg by  nebulization every 4 (four) hours as needed for wheezing or shortness of breath.    prn at prn  . meclizine (ANTIVERT) 25 MG tablet Take 1 tablet (25 mg total) by mouth 3 (three) times daily as needed for dizziness. 30 tablet 0 prn at prn  . nitroGLYCERIN (NITROSTAT) 0.4 MG SL tablet Place 1 tablet (0.4 mg total) under the tongue every 5 (five) minutes as needed for chest pain. 30 tablet 0 prn at prn  . ondansetron (ZOFRAN ODT) 4 MG disintegrating tablet Take 1 tablet (4 mg total) by mouth every 8 (eight) hours as needed for nausea or vomiting. 20 tablet 0 prn at prn   Scheduled:  . diltiazem  180 mg Oral Daily  . insulin aspart  0-15 Units Subcutaneous TID WC  . insulin aspart  0-5 Units Subcutaneous QHS  . lisinopril  20 mg Oral Daily  . metoprolol tartrate  25 mg Oral BID  . pantoprazole  40 mg Oral Daily  . sodium chloride flush  3 mL Intravenous Q12H   Infusions:  . 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  Patient was prescribed rivaroxaban prior to admission - unclear if she has been taking it. Patient has a history of noncompliance. She told the MD that she was not taking rivaroxaban, but reported to pharmacy that she took a dose this morning.   Baseline labs (CBC, INR, aPTT) have resulted. I have ordered add-on baseline heparin level.  Goal of Therapy:  Heparin level 0.3-0.7 units/ml Monitor platelets by anticoagulation protocol: Yes   Plan:  NO bolus since patient reports taking rivaroxaban this morning Start heparin infusion at 1100 units/hr Check anti-Xa level in 8 hours and daily while on heparin Continue to monitor H&H and platelets  Shallon Yaklin S, PharmD, BCPS Clinical Pharmacist 02/07/2016,6:39 AM   Addendum: Will order aPTT and HL in 8 hours - will need to follow aPTT if baseline HL is  elevated.  11/28 AM heparin level 0.45, aPTT 63. Levels appear to correlate.  Continue current regimen and recheck heparin level in 8 hours to confirm.  Leslieanne Cobarrubias S 02/07/16 6:39 AM

## 2016-02-07 NOTE — Progress Notes (Signed)
Pt's BP continues to remain elevated, even after IV hydralazine, vital signs are as follows. HR has been getting up into the 140's not sustaining and is still complaining of abdominal pain, morphine has not really helped. MD paged, explained to Dr. Tobi Bastos what was going on, He will put in orders for a one time order for 20mg  IV hydralazine, and I will try the Oxycodone for pain. Will continue to monitor. Shirley Friar, RN, BSN   Vitals:   02/07/16 0147 02/07/16 0148  BP: (!) 177/80 (!) 194/79  Pulse: (!) 49 (!) 118  Resp: 18 18  Temp:

## 2016-02-07 NOTE — Progress Notes (Signed)
I went to see the patient this evening and apparently she was in ultrasound for a DVT evaluation.  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  Nevertheless, a celiac stenosis should not be adequate to cause mesenteric ischemia. This is why further questioning of the patient will be important.  I will plan to see the patient tomorrow and complete a full consult

## 2016-02-07 NOTE — Care Management (Signed)
Patient was recently discharged and referral was made to [facility] for home health nurse and social work.  Notified agency of admission.   Patient was seen by home health nurse and social worker 11.27.  Staff had some very strong conversations with patient about the compliance with medication administration.  Patient admitted she does not take her diuretic because she does not want to have to pee all day.  Agency is working with a drug store to [employer] for patient and delivering them to the home.  Social work was working on [employer].  Patient told the home health social worker that she had an appointment with her doctor at 4 pm during the home visit.  Informed agency that patient called ems and presented to the ER instead of keeping office appointment.   She is readmitted with vague symptoms of not feeling well. She was found to have renal thrombus.   She was suppose to be taking Xarelto.  Medication compliance is a known issue on previous admissions.  Patient admitted that it is because she can not read but home health agency has found that patient is picking and choosing what pills to take out of the planner.  Agency is willing to continue to work with patient but patient does not seem to be able to grasp the serious consequences of her medical non compliance.

## 2016-02-07 NOTE — Progress Notes (Signed)
[facility] [place] [facility]   PATIENT NAME: Robin Spurlinglizabeth Shupert    MR#:  [id number]  DATE OF BIRTH:  08/19/1940  SUBJECTIVE:  CHIEF COMPLAINT:   Chief Complaint  Patient presents with  . Nausea  . Diarrhea  Patient is a 75 year old Caucasian female with past medical history significant for history of atrial fibrillation, anxiety, asthma, CHF, coronary artery disease, diabetes, hypertension, hyperlipidemia, who is supposed to be on anticoagulation with Xarelto, but not sure if she takes it, who presents to the hospital with complaints of right-sided abdominal pain, flank pain for the past few days, nausea, vomiting and diarrhea. On arrival to the hospital. She had CT scan of the abdomen and pelvis, revealing right renal artery occlusion with renal infarct, high-grade superior mesenteric artery stenosis, patient admits of abdominal pain after she eats, unable to get full review of systems due to altered mental status/somnolence  Review of Systems  Unable to perform ROS: Mental acuity    VITAL SIGNS: Blood pressure 112/70, pulse 91, temperature 98.7 F (37.1 C), temperature source Oral, resp. rate 18, height 5\' 4"  (1.626 m), weight 71.8 kg (158 lb 3.2 oz), SpO2 94 %.  PHYSICAL EXAMINATION:   GENERAL:  75 y.o.-year-old patient lying in the bed with no acute distress, very somnolent, barely opens her eyes, unable to converse, intermittently follows commands.  EYES: Pupils equal, round, reactive to light and accommodation. No scleral icterus. Extraocular muscles intact.  HEENT: Head atraumatic, normocephalic. Oropharynx and nasopharynx clear.  NECK:  Supple, no jugular venous distention. No thyroid enlargement, no tenderness.  LUNGS: Normal breath sounds bilaterally, no wheezing, few scattered rales,rhonchi and crepitations noted diffusely. No use of accessory muscles of respiration.  CARDIOVASCULAR: S1, S2 normal. No murmurs, rubs, or gallops.  ABDOMEN: Soft,  tender in right side, no rebound or guarding was noted, tenderness on right flank percussion, nondistended. Bowel sounds present. No organomegaly or mass.  EXTREMITIES: 2+ lower extremity and pedal edema on the left, less so on the right, no cyanosis, or clubbing.  NEUROLOGIC: Cranial nerves II through XII are intact. Muscle strength 5/5 in all extremities. Sensation intact. Gait not checked.  PSYCHIATRIC: The patient is alert and oriented x 3.  SKIN: No obvious rash, lesion, or ulcer.   ORDERS/RESULTS REVIEWED:   CBC  Recent Labs Lab 02/06/16 1516 02/07/16 0556  WBC 19.3* 17.6*  HGB 12.4 12.8  HCT 37.1 39.2  PLT 223 257  MCV 83.8 86.0  MCH 28.1 28.2  MCHC 33.5 32.8  RDW 14.6* 14.3  LYMPHSABS 1.5  --   MONOABS 0.8  --   EOSABS 0.1  --   BASOSABS 0.1  --    ------------------------------------------------------------------------------------------------------------------  Chemistries   Recent Labs Lab 02/06/16 1516 02/07/16 0556  NA 140 138  K 3.6 4.0  CL 109 102  CO2 23 22  GLUCOSE 236* 404*  BUN 20 22*  CREATININE 0.52 1.01*  CALCIUM 8.2* 8.7*  AST 26  --   ALT 30  --   ALKPHOS 130*  --   BILITOT 0.2*  --    ------------------------------------------------------------------------------------------------------------------ estimated creatinine clearance is 46.7 mL/min (by C-G formula based on SCr of 1.01 mg/dL (H)). ------------------------------------------------------------------------------------------------------------------ No results for input(s): TSH, T4TOTAL, T3FREE, THYROIDAB in the last 72 hours.  Invalid input(s): FREET3  Cardiac Enzymes  Recent Labs Lab 02/06/16 1516  TROPONINI <0.03   ------------------------------------------------------------------------------------------------------------------ Invalid input(s):  POCBNP ---------------------------------------------------------------------------------------------------------------  RADIOLOGY: Dg Chest 2 View  Result Date: 02/06/2016 CLINICAL DATA:  Chest pain starting 12 p.m. today EXAM: CHEST  2 VIEW COMPARISON:  01/24/2016 FINDINGS: Cardiomegaly is noted. No infiltrate or pleural effusion. No pulmonary edema. Osteopenia and mild degenerative change thoracic spine. Status post median sternotomy. IMPRESSION: No active cardiopulmonary disease. Cardiomegaly again noted. Status post median sternotomy. Electronically Signed   By: Natasha Mead M.D.   On: 02/06/2016 16:31   Ct Abdomen Pelvis W Contrast  Result Date: 02/06/2016 CLINICAL DATA:  Nausea, vomiting and diarrhea for 1 hour. Hypertensive. History of hypertension, diabetes. EXAM: CT ABDOMEN AND PELVIS WITH CONTRAST TECHNIQUE: Multidetector CT imaging of the abdomen and pelvis was performed using the standard protocol following bolus administration of intravenous contrast. CONTRAST:  ISOVUE-300 IOPAMIDOL (ISOVUE-300) INJECTION 61% COMPARISON:  Acute abdominal series February 06, 2016 at 1614 hours FINDINGS: LOWER CHEST: Small bilateral pleural effusions. Interlobular septal thickening most consistent with chronic CHF. The heart is at least mildly enlarged. No pericardial effusions. HEPATOBILIARY: The liver is diffusely hypodense compatible with steatosis with mild focal fatty sparing about the gallbladder fossa. Gallbladder is normal. PANCREAS: Normal. SPLEEN: Normal. ADRENALS/URINARY TRACT: Kidneys are orthotopic demonstrate normal enhancement. Hyper perfused RIGHT kidney with minimal enhancement on early phase, slightly increased on delayed phase. Thromboembolism RIGHT renal artery from the origin distally. RIGHT interpolar renal scarring. Too small to characterize hypodensity lower pole LEFT kidney. 3 mm nonobstructing RIGHT lower pole and 2 mm RIGHT upper pole nephrolithiasis. Urinary bladder is well  distended and unremarkable. STOMACH/BOWEL: The stomach, small and large bowel are normal in course and caliber without inflammatory changes. Moderate sigmoid diverticulosis. Normal appendix. VASCULAR/LYMPHATIC: Aortoiliac vessels are normal in course and caliber, severe atherosclerosis. 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 No lymphadenopathy by CT size criteria. REPRODUCTIVE: Normal. OTHER: Small amount of low-density free fluid in the pelvis. No focal fluid collection, no intraperitoneal free air. MUSCULOSKELETAL: Nonacute. Moderate degenerative change of LEFT hip. 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 Anterior abdominal wall scarring with small fat containing umbilical hernia. 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 Severe atherosclerosis. 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 Colonic diverticulosis without acute diverticulitis. Small bilateral pleural effusions. Acute findings discussed with and reconfirmed by Dr.JAMES MCSHANE on 02/06/2016 at 6:55 pm. Electronically Signed   By: Awilda Metro M.D.   On: 02/06/2016 18:59   Dg Abd 2 Views  Result Date: 02/06/2016 CLINICAL DATA:  Initial evaluation for acute abdominal pain, fever. EXAM: ABDOMEN - 2 VIEW COMPARISON:  Prior radiograph from 05/07/2014. FINDINGS: Bowel gas pattern within normal limits without evidence for obstruction or ileus. No abnormal bowel wall thickening. No free air. No soft tissue mass or abnormal calcification. Median sternotomy wires noted. Scattered degenerative changes noted within the visualized spine. Degenerative changes noted about the hips bilaterally as well. IMPRESSION: Nonobstructive bowel gas pattern with no radiographic evidence for acute intra-abdominal or pelvic process. Electronically Signed   By: Rise Mu M.D.   On: 02/06/2016 16:33     EKG:  Orders placed or performed during the hospital encounter of 02/06/16  . EKG 12-Lead  . EKG 12-Lead    ASSESSMENT AND PLAN:  Active Problems:   Renal infarct [facility])   Atrial fibrillation with rapid ventricular response (HCC)  #1. 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 Right-sided abdominal pain, likely due to acute renal infarct,  continue pain medications, decrease doses due to patient's somnolence #3. Acute renal insufficiency, patient was given IV fluids, now discontinued, follow creatinine in the morning #4 left extremity swelling, Doppler ultrasound is ordered to r/o dvt #5. Leukocytosis, likely reactive, follow closely #6. Metabolic encephalopathy, questionable medication related, decrease opiates, follow closely, getting psychiatrist involved for competency evaluation, make sure patient understands risks of not taking her medications including current admission with acute renal infarct, which was felt to be thromboembolism related/possibly not taking Xarelto.   Management plans discussed with the patient, family and they are in agreement.   DRUG ALLERGIES: No Known Allergies  CODE STATUS:     Code Status Orders        Start     Ordered   02/06/16 1917  Full code  Continuous     02/06/16 1917    Code Status History    Date Active Date Inactive Code Status Order ID Comments User Context   01/24/2016  2:00 PM 01/25/2016  7:15 PM Full Code [id number]  Auburn Bilberry, MD Inpatient   11/25/2015  3:40 PM 11/26/2015  3:49 PM Full Code [id number]  Houston Siren, MD Inpatient   11/05/2015 12:33 AM 11/06/2015  6:05 PM Full Code [id number]  Oralia Manis, MD Inpatient   09/13/2015 10:10 AM 09/15/2015  7:33 PM Full Code [id number]  Altamese Dilling, MD Inpatient      TOTAL TIME TAKING CARE OF THIS PATIENT: 40  minutes.    Katharina Caper M.D on 02/07/2016 at 3:57 PM  Between 7am to 6pm - Pager - [phone]  After 6pm go to [email or web address] - password [email or web address]  [facility]  Office  [phone]  CC: Primary care physician; Leanna Sato, MD

## 2016-02-07 NOTE — Progress Notes (Signed)
Pt's HR continues to stay anywhere from 110-140's afib. MD paged, Dr. Tobi Bastos gave orders to give oral cardizem dose early. Also pt is a diabetic, glucose was 404 on her labs this morning. Pt has no CBG checks or any insulin ordered. Dr. Tobi Bastos to order sliding scale with CBG checks. Will continue to monitor. Shirley Friar, RN, BSN

## 2016-02-07 NOTE — Consult Note (Signed)
[facility] Face-to-Face Psychiatry Consult   Reason for Consult:  Consult for 75 year old woman without any past psychiatric history. Concern about capacity Referring Physician:  Judeen [name] Patient Identification: Robin [name] MRN:  [id number] Principal Diagnosis: Noncompliance Diagnosis:   Patient Active Problem List   Diagnosis Date Noted  . Noncompliance [Z91.19] 02/07/2016  . Renal infarct ([place]) [N28.0] 02/06/2016  . Atrial fibrillation with rapid ventricular response ([place]) [I48.91] 02/06/2016  . A-fib ([place]) [I48.91] 01/24/2016  . HTN (hypertension) [I10] 11/04/2015  . HLD (hyperlipidemia) [E78.5] 11/04/2015  . CAD (coronary artery disease) [I25.10] 11/04/2015  . Chronic systolic CHF (congestive heart failure) ([place]) [I50.22] 11/04/2015  . Diabetes ([place]) [E11.9] 11/04/2015    Total Time spent with patient: 1 hour  Subjective:    Robin [name] is a 75 y.o. female patient admitted with "I was sick".  HPI:  Patient interviewed. Chart reviewed. Spoke with care management and nursing. 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 Concern is raised about her capacity to take her medication at home specifically her anticoagulant. Evidently there is been a lot of concern in the past about noncompliance. It was documented in the chart on a couple of occasions that she had said she would not take that medicine because she was afraid of side effects. On interview today the patient initially stated that she did not know why she was in the hospital. When I explained to her that she had a blood clot in her kidney she was able to tell me what a blood clot was. Patient was familiar with the idea of blood clots and was able to explain that she had had them in the past. Patient is aware that she is prescribed any anticoagulant medication. She states that she has had times in the past when she was  refusing to take it because she was "told by all of those people" that it would "kill" her. Her daughter challenged her as to who told her that and the patient claimed it was a Software engineer. Her friend that she lives with was also present in the room and he claims that the "person" who comes by and helps to manage her medicine (I would assume this is a home health nurse) has been throwing away some of her medicine. Altogether a confusing situation. He also claims that he doesn't think the patient has actually been getting her anticoagulant at home. 2 of her understanding the reason for it though the patient does understand and was able to articulate to me that blood clots or potentially fatal in multiple ways. I explained to her that all medications have some side effects with them but that the doctors strongly feel that the risk of the anticoagulant is minor compared to the risk of recurrent blood clots. Patient stated to me that she understood this and agrees to continue taking her medicine in the future.  Social history: Appears to live with a female friend. She has at least 1 daughter whom I met who seems to have some involvement in the patient's care although perhaps not day-to-day.  Medical history: History of recurrent coagulation problems. Atrial fibrillation. Hypertension  Substance abuse history: Nonidentified  Past Psychiatric History: No past psychiatric history. Patient says she's never seen a psychiatrist or therapist before. Claims to never been prescribed any medicine for psychiatric reasons. No history of suicide attempts.  Risk to Self: Is patient at risk for  suicide?: No Risk to Others:   Prior Inpatient Therapy:   Prior Outpatient Therapy:    Past Medical History:  Past Medical History:  Diagnosis Date  . Afib ([place])   . Anxiety   . Asthma   . CHF (congestive heart failure) ([place])   . Coronary artery disease   . Diabetes mellitus without complication ([place])   . Hyperlipidemia    . Hypertension     Past Surgical History:  Procedure Laterality Date  . CORONARY ARTERY BYPASS GRAFT    . NO PAST SURGERIES     Family History:  Family History  Problem Relation Age of Onset  . Hypertension Mother   . Heart failure Mother   . Heart disease Father    Family Psychiatric  History: Patient denies knowing of any family history of mental health problems Social History:  History  Alcohol Use No     History  Drug Use No    Social History   Social History  . Marital status: Single    Spouse name: N/A  . Number of children: N/A  . Years of education: N/A   Social History Main Topics  . Smoking status: Never Smoker  . Smokeless tobacco: Never Used  . Alcohol use No  . Drug use: No  . Sexual activity: No   Other Topics Concern  . None   Social History Narrative  . None   Additional Social History:    Allergies:  No Known Allergies  Labs:  Results for orders placed or performed during the hospital encounter of 02/06/16 (from the past 48 hour(s))  Comprehensive metabolic panel     Status: Abnormal   Collection Time: 02/06/16  3:16 PM  Result Value Ref Range   Sodium 140 135 - 145 mmol/L   Potassium 3.6 3.5 - 5.1 mmol/L   Chloride 109 101 - 111 mmol/L   CO2 23 22 - 32 mmol/L   Glucose, Bld 236 (H) 65 - 99 mg/dL   BUN 20 6 - 20 mg/dL   Creatinine, Ser 0.52 0.44 - 1.00 mg/dL   Calcium 8.2 (L) 8.9 - 10.3 mg/dL   Total Protein 6.7 6.5 - 8.1 g/dL   Albumin 3.5 3.5 - 5.0 g/dL   AST 26 15 - 41 U/L   ALT 30 14 - 54 U/L   Alkaline Phosphatase 130 (H) 38 - 126 U/L   Total Bilirubin 0.2 (L) 0.3 - 1.2 mg/dL   GFR calc non Af Amer >60 >60 mL/min   GFR calc Af Amer >60 >60 mL/min    Comment: (NOTE) The eGFR has been calculated using the CKD EPI equation. This calculation has not been validated in all clinical situations. eGFR's persistently <60 mL/min signify possible Chronic Kidney Disease.    Anion gap 8 5 - 15  Lipase, blood     Status: None    Collection Time: 02/06/16  3:16 PM  Result Value Ref Range   Lipase 18 11 - 51 U/L  CBC with Differential     Status: Abnormal   Collection Time: 02/06/16  3:16 PM  Result Value Ref Range   WBC 19.3 (H) 3.6 - 11.0 K/uL   RBC 4.43 3.80 - 5.20 MIL/uL   Hemoglobin 12.4 12.0 - 16.0 g/dL   HCT 37.1 35.0 - 47.0 %   MCV 83.8 80.0 - 100.0 fL   MCH 28.1 26.0 - 34.0 pg   MCHC 33.5 32.0 - 36.0 g/dL   RDW 14.6 (H) 11.5 - 14.5 %  Platelets 223 150 - 440 K/uL   Neutrophils Relative % 88 %   Neutro Abs 16.8 (H) 1.4 - 6.5 K/uL   Lymphocytes Relative 8 %   Lymphs Abs 1.5 1.0 - 3.6 K/uL   Monocytes Relative 4 %   Monocytes Absolute 0.8 0.2 - 0.9 K/uL   Eosinophils Relative 0 %   Eosinophils Absolute 0.1 0 - 0.7 K/uL   Basophils Relative 0 %   Basophils Absolute 0.1 0 - 0.1 K/uL  Troponin I     Status: None   Collection Time: 02/06/16  3:16 PM  Result Value Ref Range   Troponin I <0.03 <0.03 ng/mL  Protime-INR     Status: None   Collection Time: 02/06/16  3:16 PM  Result Value Ref Range   Prothrombin Time 13.1 11.4 - 15.2 seconds   INR 0.99   Brain natriuretic peptide     Status: Abnormal   Collection Time: 02/06/16  3:16 PM  Result Value Ref Range   B Natriuretic Peptide 274.0 (H) 0.0 - 100.0 pg/mL  Urine culture     Status: Abnormal   Collection Time: 02/06/16  3:16 PM  Result Value Ref Range   Specimen Description URINE, CLEAN CATCH    Special Requests NONE    Culture MULTIPLE SPECIES PRESENT, SUGGEST RECOLLECTION (A)    Report Status 02/07/2016 FINAL   Urinalysis complete, with microscopic     Status: Abnormal   Collection Time: 02/06/16  3:16 PM  Result Value Ref Range   Color, Urine STRAW (A) YELLOW   APPearance CLEAR (A) CLEAR   Glucose, UA >500 (A) NEGATIVE mg/dL   Bilirubin Urine NEGATIVE NEGATIVE   Ketones, ur TRACE (A) NEGATIVE mg/dL   Specific Gravity, Urine 1.009 1.005 - 1.030   Hgb urine dipstick NEGATIVE NEGATIVE   pH 7.0 5.0 - 8.0   Protein, ur 100 (A) NEGATIVE  mg/dL   Nitrite NEGATIVE NEGATIVE   Leukocytes, UA NEGATIVE NEGATIVE   RBC / HPF 0-5 0 - 5 RBC/hpf   WBC, UA 0-5 0 - 5 WBC/hpf   Bacteria, UA NONE SEEN NONE SEEN   Squamous Epithelial / LPF 0-5 (A) NONE SEEN  Heparin level (unfractionated)     Status: Abnormal   Collection Time: 02/06/16  8:11 PM  Result Value Ref Range   Heparin Unfractionated <0.10 (L) 0.30 - 0.70 IU/mL    Comment:        IF HEPARIN RESULTS ARE BELOW EXPECTED VALUES, AND PATIENT DOSAGE HAS BEEN CONFIRMED, SUGGEST FOLLOW UP TESTING OF ANTITHROMBIN III LEVELS.   Basic metabolic panel     Status: Abnormal   Collection Time: 02/07/16  5:56 AM  Result Value Ref Range   Sodium 138 135 - 145 mmol/L   Potassium 4.0 3.5 - 5.1 mmol/L   Chloride 102 101 - 111 mmol/L   CO2 22 22 - 32 mmol/L   Glucose, Bld 404 (H) 65 - 99 mg/dL   BUN 22 (H) 6 - 20 mg/dL   Creatinine, Ser 1.01 (H) 0.44 - 1.00 mg/dL   Calcium 8.7 (L) 8.9 - 10.3 mg/dL   GFR calc non Af Amer 53 (L) >60 mL/min   GFR calc Af Amer >60 >60 mL/min    Comment: (NOTE) The eGFR has been calculated using the CKD EPI equation. This calculation has not been validated in all clinical situations. eGFR's persistently <60 mL/min signify possible Chronic Kidney Disease.    Anion gap 14 5 - 15  CBC  Status: Abnormal   Collection Time: 02/07/16  5:56 AM  Result Value Ref Range   WBC 17.6 (H) 3.6 - 11.0 K/uL   RBC 4.56 3.80 - 5.20 MIL/uL   Hemoglobin 12.8 12.0 - 16.0 g/dL   HCT 39.2 35.0 - 47.0 %   MCV 86.0 80.0 - 100.0 fL   MCH 28.2 26.0 - 34.0 pg   MCHC 32.8 32.0 - 36.0 g/dL   RDW 14.3 11.5 - 14.5 %   Platelets 257 150 - 440 K/uL  Heparin level (unfractionated)     Status: None   Collection Time: 02/07/16  5:56 AM  Result Value Ref Range   Heparin Unfractionated 0.45 0.30 - 0.70 IU/mL    Comment:        IF HEPARIN RESULTS ARE BELOW EXPECTED VALUES, AND PATIENT DOSAGE HAS BEEN CONFIRMED, SUGGEST FOLLOW UP TESTING OF ANTITHROMBIN III LEVELS.   APTT      Status: Abnormal   Collection Time: 02/07/16  5:56 AM  Result Value Ref Range   aPTT 63 (H) 24 - 36 seconds    Comment:        IF BASELINE aPTT IS ELEVATED, SUGGEST PATIENT RISK ASSESSMENT BE USED TO DETERMINE APPROPRIATE ANTICOAGULANT THERAPY.   Glucose, capillary     Status: Abnormal   Collection Time: 02/07/16  7:39 AM  Result Value Ref Range   Glucose-Capillary 369 (H) 65 - 99 mg/dL  Glucose, capillary     Status: Abnormal   Collection Time: 02/07/16 11:49 AM  Result Value Ref Range   Glucose-Capillary 288 (H) 65 - 99 mg/dL  Heparin level (unfractionated)     Status: None   Collection Time: 02/07/16  1:47 PM  Result Value Ref Range   Heparin Unfractionated 0.70 0.30 - 0.70 IU/mL    Comment:        IF HEPARIN RESULTS ARE BELOW EXPECTED VALUES, AND PATIENT DOSAGE HAS BEEN CONFIRMED, SUGGEST FOLLOW UP TESTING OF ANTITHROMBIN III LEVELS.   Glucose, capillary     Status: Abnormal   Collection Time: 02/07/16  5:10 PM  Result Value Ref Range   Glucose-Capillary 159 (H) 65 - 99 mg/dL    Current Facility-Administered Medications  Medication Dose Route Frequency Provider Last Rate Last Dose  . acetaminophen (TYLENOL) tablet 650 mg  650 mg Oral Q6H PRN Lytle Butte, MD       Or  . acetaminophen (TYLENOL) suppository 650 mg  650 mg Rectal Q6H PRN Lytle Butte, MD      . diltiazem (CARDIZEM CD) 24 hr capsule 180 mg  180 mg Oral BID Theodoro Grist, MD   180 mg at 02/07/16 0933  . heparin ADULT infusion 100 units/mL (25000 units/224m sodium chloride 0.45%)  1,100 Units/hr Intravenous Continuous MLenis Noon [id number] mL/hr at 02/06/16 2035 1,100 Units/hr at 02/06/16 2035  . insulin aspart (novoLOG) injection 0-15 Units  0-15 Units Subcutaneous TID WC PSaundra Shelling MD   3 Units at 02/07/16 1724  . insulin aspart (novoLOG) injection 0-5 Units  0-5 Units Subcutaneous QHS Pavan Pyreddy, MD      . ipratropium-albuterol (DUONEB) 0.5-2.5 (3) MG/3ML nebulizer solution 3 mL  3 mL  Nebulization Q4H PRN DLytle Butte MD      . metoprolol (LOPRESSOR) tablet 50 mg  50 mg Oral Q6H RTheodoro Grist MD   50 mg at 02/07/16 1353  . nitroGLYCERIN (NITROSTAT) SL tablet 0.4 mg  0.4 mg Sublingual Q5 min PRN DLytle Butte MD      .  ondansetron (ZOFRAN) tablet 4 mg  4 mg Oral Q6H PRN Lytle Butte, MD       Or  . ondansetron [facility]) injection 4 mg  4 mg Intravenous Q6H PRN Lytle Butte, MD   4 mg at 02/06/16 2227  . oxyCODONE (Oxy IR/ROXICODONE) immediate release tablet 5 mg  5 mg Oral Q4H PRN Theodoro Grist, MD      . pantoprazole (PROTONIX) EC tablet 40 mg  40 mg Oral Daily Lytle Butte, MD   40 mg at 02/07/16 1023  . sodium chloride flush (NS) 0.9 % injection 3 mL  3 mL Intravenous Q12H Lytle Butte, MD   3 mL at 02/07/16 1023    Musculoskeletal: Strength & Muscle Tone: decreased Gait & Station: unable to stand Patient leans: N/A  Psychiatric Specialty Exam: Physical Exam  Nursing note and vitals reviewed. Constitutional: She appears well-developed and well-nourished.  HENT:  Head: Normocephalic and atraumatic.  Eyes: Conjunctivae are normal. Pupils are equal, round, and reactive to light.  Neck: Normal range of motion.  Cardiovascular: Normal heart sounds.   Respiratory: She is in respiratory distress.  GI: Soft. There is tenderness.  Musculoskeletal: Normal range of motion.  Neurological: She is alert.  Skin: Skin is warm and dry.  Psychiatric: Judgment normal. Her affect is blunt. Her speech is delayed. She is slowed. Thought content is not paranoid. Cognition and memory are normal. She expresses no homicidal and no suicidal ideation.    Review of Systems  Constitutional: Negative.   HENT: Negative.   Eyes: Negative.   Respiratory: Negative.   Cardiovascular: Positive for palpitations.  Gastrointestinal: Positive for abdominal pain.  Musculoskeletal: Negative.   Skin: Negative.   Neurological: Negative.   Psychiatric/Behavioral: Negative.  Negative for  depression.    Blood pressure 112/70, pulse 91, temperature 98.7 F (37.1 C), temperature source Oral, resp. rate 18, height 5' 4"  (1.626 m), weight 71.8 kg (158 lb 3.2 oz), SpO2 94 %.Body mass index is 27.15 kg/m.  General Appearance: Disheveled  Eye Contact:  Minimal  Speech:  Slow  Volume:  Decreased  Mood:  Euthymic  Affect:  Blunt  Thought Process:  Goal Directed  Orientation:  Full (Time, Place, and Person)  Thought Content:  Tangential  Suicidal Thoughts:  No  Homicidal Thoughts:  No  Memory:  Immediate;   Fair Recent;   Fair Remote;   Fair  Judgement:  Fair  Insight:  Fair  Psychomotor Activity:  Decreased  Concentration:  Concentration: Fair  Recall:  [employer] of Knowledge:  Fair  Language:  Fair  Akathisia:  No  Handed:  Right  AIMS (if indicated):     Assets:  Communication Skills Desire for Improvement Housing Social Support  ADL's:  Impaired  Cognition:  Impaired,  Mild  Sleep:        Treatment Plan Summary: Plan 75 year old woman with multiple medical problems. Patient seems to be a little bit slow in her thinking although some of that may be from current medication. She is not reporting any symptoms of depression or psychosis. As far as capacity I think that the patient certainly has the capacity to make decisions about medications that she would take although she probably needs to have pros and cons of medicines carefully described to her. It sounds like there has been a system in place in the past to make sure she was staying on her medicine appropriately and that may have recently fallen through. Family might want  to look into making sure she is getting all of her medicines from the drugstore correctly. No need however for any specific psychiatric treatment. I will follow-up only as needed.  Disposition: Patient does not meet criteria for psychiatric inpatient admission.  Alethia Berthold, MD 02/07/2016 6:09 PM

## 2016-02-07 NOTE — Progress Notes (Signed)
ANTICOAGULATION CONSULT NOTE - Initial Consult  Pharmacy Consult for heparin drip Indication: renal infarct  No Known Allergies  Patient Measurements: Height: 5\' 4"  (162.6 cm) Weight: 158 lb 3.2 oz (71.8 kg) IBW/kg (Calculated) : 54.7 Heparin Dosing Weight: 69 kg  Vital Signs: Temp: 98.7 F (37.1 C) (11/28 1137) Temp Source: Oral (11/28 1137) BP: 112/70 (11/28 1137) Pulse Rate: 91 (11/28 1137)  Labs:  Recent Labs  02/06/16 1516 02/06/16 2011 02/07/16 0556 02/07/16 1347  HGB 12.4  --  12.8  --   HCT 37.1  --  39.2  --   PLT 223  --  257  --   APTT  --   --  63*  --   LABPROT 13.1  --   --   --   INR 0.99  --   --   --   HEPARINUNFRC  --  <0.10* 0.45 0.70  CREATININE 0.52  --  1.01*  --   TROPONINI <0.03  --   --   --     Estimated Creatinine Clearance: 46.7 mL/min (by C-G formula based on SCr of 1.01 mg/dL (H)).   Medical History: Past Medical History:  Diagnosis Date  . Afib ([facility])   . Anxiety   . Asthma   . CHF (congestive heart failure) ([facility])   . Coronary artery disease   . Diabetes mellitus without complication ([facility])   . Hyperlipidemia   . Hypertension     Medications:  Prescriptions Prior to Admission  Medication Sig Dispense Refill Last Dose  . diltiazem (CARDIZEM CD) 180 MG 24 hr capsule Take 1 capsule (180 mg total) by mouth daily. 30 capsule 0 02/06/2016 at 0800  . lisinopril (PRINIVIL,ZESTRIL) 20 MG tablet Take 1 tablet (20 mg total) by mouth daily. 30 tablet 0 02/06/2016 at 0800  . metoprolol tartrate (LOPRESSOR) 25 MG tablet Take 1 tablet (25 mg total) by mouth 2 (two) times daily. 30 tablet 0 02/06/2016 at 0800  . pantoprazole (PROTONIX) 40 MG tablet Take 1 tablet (40 mg total) by mouth daily. (Patient taking differently: Take 40 mg by mouth daily as needed. For acid reflux) 30 tablet 2 02/06/2016 at 0800  . albuterol (ACCUNEB) 1.25 MG/3ML nebulizer solution Take 1.25 mg by nebulization every 4 (four) hours as needed for wheezing or shortness  of breath.    prn at prn  . meclizine (ANTIVERT) 25 MG tablet Take 1 tablet (25 mg total) by mouth 3 (three) times daily as needed for dizziness. 30 tablet 0 prn at prn  . nitroGLYCERIN (NITROSTAT) 0.4 MG SL tablet Place 1 tablet (0.4 mg total) under the tongue every 5 (five) minutes as needed for chest pain. 30 tablet 0 prn at prn  . ondansetron (ZOFRAN ODT) 4 MG disintegrating tablet Take 1 tablet (4 mg total) by mouth every 8 (eight) hours as needed for nausea or vomiting. 20 tablet 0 prn at prn  . rivaroxaban (XARELTO) 20 MG TABS tablet Take 1 tablet (20 mg total) by mouth daily. (Patient not taking: Reported on 02/07/2016) 30 tablet 0 Not Taking   Scheduled:  . diltiazem  180 mg Oral BID  . insulin aspart  0-15 Units Subcutaneous TID WC  . insulin aspart  0-5 Units Subcutaneous QHS  . metoprolol tartrate  50 mg Oral Q6H  . pantoprazole  40 mg Oral Daily  . sodium chloride flush  3 mL Intravenous Q12H   Infusions:  . 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  Patient was prescribed rivaroxaban prior to admission - unclear if she has been taking it. Patient has a history of noncompliance. She told the MD that she was not taking rivaroxaban, but reported to pharmacy that she took a dose this morning.   Based on heparin level < 0.10 appears that she was not taking   Goal of Therapy:  Heparin level 0.3-0.7 units/ml Monitor platelets by anticoagulation protocol: Yes   Plan:  Current orders for heparin 1,100 units/hr. Second heparin level 0.70 - upper limit of therapeutic, significant increase from first level. Will recheck in 8 hours to confirm. Repeat CBC with AM labs.  Martyn Malay, PharmD, BCPS Clinical Pharmacist 02/07/2016,4:19 PM

## 2016-02-08 LAB — GLUCOSE, CAPILLARY
GLUCOSE-CAPILLARY: 239 mg/dL — AB (ref 65–99)
GLUCOSE-CAPILLARY: 361 mg/dL — AB (ref 65–99)
Glucose-Capillary: 264 mg/dL — ABNORMAL HIGH (ref 65–99)
Glucose-Capillary: 258 mg/dL — ABNORMAL HIGH (ref 65–99)

## 2016-02-08 LAB — CBC
HCT: 36.2 % (ref 35.0–47.0)
Hemoglobin: 11.8 g/dL — ABNORMAL LOW (ref 12.0–16.0)
MCH: 27.7 pg (ref 26.0–34.0)
MCHC: 32.7 g/dL (ref 32.0–36.0)
MCV: 84.7 fL (ref 80.0–100.0)
PLATELETS: 243 10*3/uL (ref 150–440)
RBC: 4.28 MIL/uL (ref 3.80–5.20)
RDW: 14.5 % (ref 11.5–14.5)
WBC: 24.6 10*3/uL — ABNORMAL HIGH (ref 3.6–11.0)

## 2016-02-08 LAB — HEPARIN LEVEL (UNFRACTIONATED)
HEPARIN UNFRACTIONATED: 0.37 [IU]/mL (ref 0.30–0.70)
HEPARIN UNFRACTIONATED: 0.31 [IU]/mL (ref 0.30–0.70)

## 2016-02-08 LAB — CREATININE, SERUM
CREATININE: 0.98 mg/dL (ref 0.44–1.00)
GFR calc Af Amer: >60 mL/min (ref >60)
GFR, EST NON AFRICAN AMERICAN: 55 mL/min — AB (ref >60)

## 2016-02-08 MED ORDER — GLUCERNA SHAKE PO LIQD
237.0000 mL | Freq: Three times a day (TID) | ORAL | Status: DC
  Administered 2016-02-08 – 2016-02-09 (×3): 237 mL via ORAL

## 2016-02-08 MED ORDER — ENSURE ENLIVE PO LIQD
237.0000 mL | Freq: Two times a day (BID) | ORAL | Status: DC
  Administered 2016-02-08 (×2): 237 mL via ORAL

## 2016-02-08 MED ORDER — TRAMADOL HCL 50 MG PO TABS: 50.0000 mg | ORAL_TABLET | Freq: Four times a day (QID) | ORAL | Status: DC | PRN

## 2016-02-08 NOTE — Progress Notes (Signed)
Inpatient Diabetes Program Recommendations  AACE/ADA: New Consensus Statement on Inpatient Glycemic Control (2015)  Target Ranges:  Prepandial:   less than 140 mg/dL      Peak postprandial:   less than 180 mg/dL (1-2 hours)      Critically ill patients:  140 - 180 mg/dL   Results for Robin [name], Robin [name] (MRN [id number]) as of 02/08/2016 10:18  Ref. Range 02/07/2016 07:39 02/07/2016 11:49 02/07/2016 17:10 02/07/2016 20:47 02/08/2016 08:16  Glucose-Capillary Latest Ref Range: 65 - 99 mg/dL 599 (H) 357 (H) 017 (H) 176 (H) 264 (H)   Review of Glycemic Control  Current orders for Inpatient glycemic control:Novolog 0-15 units TID with meals, Novolog 0-5 units QHS  Inpatient Diabetes Program Recommendations: Insulin - Basal: Glucose ranged from 159-369 mg/dl on 79/39/03 and fasting glucose is 264 mg/dl today. Please consider ordering low dose basal insulin; recommend ordering Lantus 5 units Q24H starting now.  Thanks, Orlando Penner, RN, MSN, CDE Diabetes Coordinator Inpatient Diabetes Program [phone] (Team Pager from 8am to 5pm)

## 2016-02-08 NOTE — Consult Note (Signed)
[facility] Face-to-Face Psychiatry Consult   Reason for Consult:  Consult for 75 year old woman without any past psychiatric history. Concern about capacity Referring Physician:  Judeen Hammans Patient Identification: Chace Bisch MRN:  [id number] Principal Diagnosis: Noncompliance Diagnosis:   Patient Active Problem List   Diagnosis Date Noted  . Noncompliance [Z91.19] 02/07/2016  . Renal infarct ([place]) [N28.0] 02/06/2016  . Atrial fibrillation with rapid ventricular response ([place]) [I48.91] 02/06/2016  . A-fib ([place]) [I48.91] 01/24/2016  . HTN (hypertension) [I10] 11/04/2015  . HLD (hyperlipidemia) [E78.5] 11/04/2015  . CAD (coronary artery disease) [I25.10] 11/04/2015  . Chronic systolic CHF (congestive heart failure) ([place]) [I50.22] 11/04/2015  . Diabetes ([place]) [E11.9] 11/04/2015    Total Time spent with patient: 30 minutes  Subjective:   Mckinzie Saksa is a 75 y.o. female patient admitted with "I was sick".  Follow-up for this 75 year old woman. Patient is calmer and more lucid today. Eye contact improved. Patient is able to articulate a better understanding of her problems with blood clots. She says she plans to be compliant with treatment of her blood thinner in the future. No new complaints.  HPI:  Patient interviewed. Chart reviewed. Spoke with care management and nursing. 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 Concern is raised about her capacity to take her medication at home specifically her anticoagulant. Evidently there is been a lot of concern in the past about noncompliance. It was documented in the chart on a couple of occasions that she had said she would not take that medicine because she was afraid of side effects. On interview today the patient initially stated that she did not know why she was in the hospital. When I explained to her that she had a blood clot  in her kidney she was able to tell me what a blood clot was. Patient was familiar with the idea of blood clots and was able to explain that she had had them in the past. Patient is aware that she is prescribed any anticoagulant medication. She states that she has had times in the past when she was refusing to take it because she was "told by all of those people" that it would "kill" her. Her daughter challenged her as to who told her that and the patient claimed it was a Software engineer. Her friend that she lives with was also present in the room and he claims that the "person" who comes by and helps to manage her medicine (I would assume this is a home health nurse) has been throwing away some of her medicine. Altogether a confusing situation. He also claims that he doesn't think the patient has actually been getting her anticoagulant at home. 2 of her understanding the reason for it though the patient does understand and was able to articulate to me that blood clots or potentially fatal in multiple ways. I explained to her that all medications have some side effects with them but that the doctors strongly feel that the risk of the anticoagulant is minor compared to the risk of recurrent blood clots. Patient stated to me that she understood this and agrees to continue taking her medicine in the future.  Social history: Appears to live with a female friend. She has at least 1 daughter whom I met who seems to have some involvement in the patient's care although perhaps not day-to-day.  Medical history: History of recurrent coagulation problems. Atrial  fibrillation. Hypertension  Substance abuse history: Nonidentified  Past Psychiatric History: No past psychiatric history. Patient says she's never seen a psychiatrist or therapist before. Claims to never been prescribed any medicine for psychiatric reasons. No history of suicide attempts.  Risk to Self: Is patient at risk for suicide?: No Risk to Others:   Prior  Inpatient Therapy:   Prior Outpatient Therapy:    Past Medical History:  Past Medical History:  Diagnosis Date  . Afib ([place])   . Anxiety   . Asthma   . CHF (congestive heart failure) ([place])   . Coronary artery disease   . Diabetes mellitus without complication ([place])   . Hyperlipidemia   . Hypertension     Past Surgical History:  Procedure Laterality Date  . CORONARY ARTERY BYPASS GRAFT    . NO PAST SURGERIES     Family History:  Family History  Problem Relation Age of Onset  . Hypertension Mother   . Heart failure Mother   . Heart disease Father    Family Psychiatric  History: Patient denies knowing of any family history of mental health problems Social History:  History  Alcohol Use No     History  Drug Use No    Social History   Social History  . Marital status: Single    Spouse name: N/A  . Number of children: N/A  . Years of education: N/A   Social History Main Topics  . Smoking status: Never Smoker  . Smokeless tobacco: Never Used  . Alcohol use No  . Drug use: No  . Sexual activity: No   Other Topics Concern  . None   Social History Narrative  . None   Additional Social History:    Allergies:  No Known Allergies  Labs:  Results for orders placed or performed during the hospital encounter of 02/06/16 (from the past 48 hour(s))  Heparin level (unfractionated)     Status: Abnormal   Collection Time: 02/06/16  8:11 PM  Result Value Ref Range   Heparin Unfractionated <0.10 (L) 0.30 - 0.70 IU/mL    Comment:        IF HEPARIN RESULTS ARE BELOW EXPECTED VALUES, AND PATIENT DOSAGE HAS BEEN CONFIRMED, SUGGEST FOLLOW UP TESTING OF ANTITHROMBIN III LEVELS.   Basic metabolic panel     Status: Abnormal   Collection Time: 02/07/16  5:56 AM  Result Value Ref Range   Sodium 138 135 - 145 mmol/L   Potassium 4.0 3.5 - 5.1 mmol/L   Chloride 102 101 - 111 mmol/L   CO2 22 22 - 32 mmol/L   Glucose, Bld 404 (H) 65 - 99 mg/dL   BUN 22 (H) 6 - 20 mg/dL    Creatinine, Ser 1.01 (H) 0.44 - 1.00 mg/dL   Calcium 8.7 (L) 8.9 - 10.3 mg/dL   GFR calc non Af Amer 53 (L) >60 mL/min   GFR calc Af Amer >60 >60 mL/min    Comment: (NOTE) The eGFR has been calculated using the CKD EPI equation. This calculation has not been validated in all clinical situations. eGFR's persistently <60 mL/min signify possible Chronic Kidney Disease.    Anion gap 14 5 - 15  CBC     Status: Abnormal   Collection Time: 02/07/16  5:56 AM  Result Value Ref Range   WBC 17.6 (H) 3.6 - 11.0 K/uL   RBC 4.56 3.80 - 5.20 MIL/uL   Hemoglobin 12.8 12.0 - 16.0 g/dL   HCT 39.2 35.0 - 47.0 %  MCV 86.0 80.0 - 100.0 fL   MCH 28.2 26.0 - 34.0 pg   MCHC 32.8 32.0 - 36.0 g/dL   RDW 14.3 11.5 - 14.5 %   Platelets 257 150 - 440 K/uL  Heparin level (unfractionated)     Status: None   Collection Time: 02/07/16  5:56 AM  Result Value Ref Range   Heparin Unfractionated 0.45 0.30 - 0.70 IU/mL    Comment:        IF HEPARIN RESULTS ARE BELOW EXPECTED VALUES, AND PATIENT DOSAGE HAS BEEN CONFIRMED, SUGGEST FOLLOW UP TESTING OF ANTITHROMBIN III LEVELS.   APTT     Status: Abnormal   Collection Time: 02/07/16  5:56 AM  Result Value Ref Range   aPTT 63 (H) 24 - 36 seconds    Comment:        IF BASELINE aPTT IS ELEVATED, SUGGEST PATIENT RISK ASSESSMENT BE USED TO DETERMINE APPROPRIATE ANTICOAGULANT THERAPY.   Glucose, capillary     Status: Abnormal   Collection Time: 02/07/16  7:39 AM  Result Value Ref Range   Glucose-Capillary 369 (H) 65 - 99 mg/dL  Glucose, capillary     Status: Abnormal   Collection Time: 02/07/16 11:49 AM  Result Value Ref Range   Glucose-Capillary 288 (H) 65 - 99 mg/dL  Heparin level (unfractionated)     Status: None   Collection Time: 02/07/16  1:47 PM  Result Value Ref Range   Heparin Unfractionated 0.70 0.30 - 0.70 IU/mL    Comment:        IF HEPARIN RESULTS ARE BELOW EXPECTED VALUES, AND PATIENT DOSAGE HAS BEEN CONFIRMED, SUGGEST FOLLOW UP  TESTING OF ANTITHROMBIN III LEVELS.   Glucose, capillary     Status: Abnormal   Collection Time: 02/07/16  5:10 PM  Result Value Ref Range   Glucose-Capillary 159 (H) 65 - 99 mg/dL  Glucose, capillary     Status: Abnormal   Collection Time: 02/07/16  8:47 PM  Result Value Ref Range   Glucose-Capillary 176 (H) 65 - 99 mg/dL   Comment 1 Notify RN    Comment 2 Document in Chart   Heparin level (unfractionated)     Status: None   Collection Time: 02/07/16 10:26 PM  Result Value Ref Range   Heparin Unfractionated 0.51 0.30 - 0.70 IU/mL    Comment:        IF HEPARIN RESULTS ARE BELOW EXPECTED VALUES, AND PATIENT DOSAGE HAS BEEN CONFIRMED, SUGGEST FOLLOW UP TESTING OF ANTITHROMBIN III LEVELS.   CBC     Status: Abnormal   Collection Time: 02/08/16  6:10 AM  Result Value Ref Range   WBC 24.6 (H) 3.6 - 11.0 K/uL   RBC 4.28 3.80 - 5.20 MIL/uL   Hemoglobin 11.8 (L) 12.0 - 16.0 g/dL   HCT 36.2 35.0 - 47.0 %   MCV 84.7 80.0 - 100.0 fL   MCH 27.7 26.0 - 34.0 pg   MCHC 32.7 32.0 - 36.0 g/dL   RDW 14.5 11.5 - 14.5 %   Platelets 243 150 - 440 K/uL  Creatinine, serum     Status: Abnormal   Collection Time: 02/08/16  6:10 AM  Result Value Ref Range   Creatinine, Ser 0.98 0.44 - 1.00 mg/dL   GFR calc non Af Amer 55 (L) >60 mL/min   GFR calc Af Amer >60 >60 mL/min    Comment: (NOTE) The eGFR has been calculated using the CKD EPI equation. This calculation has not been validated in all clinical situations.  eGFR's persistently <60 mL/min signify possible Chronic Kidney Disease.   Heparin level (unfractionated)     Status: None   Collection Time: 02/08/16  6:10 AM  Result Value Ref Range   Heparin Unfractionated 0.37 0.30 - 0.70 IU/mL    Comment:        IF HEPARIN RESULTS ARE BELOW EXPECTED VALUES, AND PATIENT DOSAGE HAS BEEN CONFIRMED, SUGGEST FOLLOW UP TESTING OF ANTITHROMBIN III LEVELS.   Glucose, capillary     Status: Abnormal   Collection Time: 02/08/16  8:16 AM  Result Value  Ref Range   Glucose-Capillary 264 (H) 65 - 99 mg/dL  Glucose, capillary     Status: Abnormal   Collection Time: 02/08/16 11:51 AM  Result Value Ref Range   Glucose-Capillary 239 (H) 65 - 99 mg/dL  Heparin level (unfractionated)     Status: None   Collection Time: 02/08/16  2:14 PM  Result Value Ref Range   Heparin Unfractionated 0.31 0.30 - 0.70 IU/mL    Comment:        IF HEPARIN RESULTS ARE BELOW EXPECTED VALUES, AND PATIENT DOSAGE HAS BEEN CONFIRMED, SUGGEST FOLLOW UP TESTING OF ANTITHROMBIN III LEVELS.   Glucose, capillary     Status: Abnormal   Collection Time: 02/08/16  5:04 PM  Result Value Ref Range   Glucose-Capillary 361 (H) 65 - 99 mg/dL    Current Facility-Administered Medications  Medication Dose Route Frequency Provider Last Rate Last Dose  . acetaminophen (TYLENOL) tablet 650 mg  650 mg Oral Q6H PRN Lytle Butte, MD       Or  . acetaminophen (TYLENOL) suppository 650 mg  650 mg Rectal Q6H PRN Lytle Butte, MD      . diltiazem (CARDIZEM CD) 24 hr capsule 180 mg  180 mg Oral BID Theodoro Grist, MD   180 mg at 02/08/16 0954  . feeding supplement (GLUCERNA SHAKE) (GLUCERNA SHAKE) liquid 237 mL  237 mL Oral TID BM Theodoro Grist, MD      . heparin ADULT infusion 100 units/mL (25000 units/253m sodium chloride 0.45%)  1,150 Units/hr Intravenous Continuous RTheodoro Grist MD 11.5 mL/hr at 02/08/16 1520 1,150 Units/hr at 02/08/16 1520  . insulin aspart (novoLOG) injection 0-15 Units  0-15 Units Subcutaneous TID WC PSaundra Shelling MD   15 Units at 02/08/16 1722  . insulin aspart (novoLOG) injection 0-5 Units  0-5 Units Subcutaneous QHS Pavan Pyreddy, MD      . ipratropium-albuterol (DUONEB) 0.5-2.5 (3) MG/3ML nebulizer solution 3 mL  3 mL Nebulization Q4H PRN DLytle Butte MD      . metoprolol (LOPRESSOR) tablet 50 mg  50 mg Oral Q6H RTheodoro Grist MD   50 mg at 02/08/16 1501  . nitroGLYCERIN (NITROSTAT) SL tablet 0.4 mg  0.4 mg Sublingual Q5 min PRN DLytle Butte MD      .  ondansetron ([facility] tablet 4 mg  4 mg Oral Q6H PRN DLytle Butte MD       Or  . ondansetron ([facility] injection 4 mg  4 mg Intravenous Q6H PRN DLytle Butte MD   4 mg at 02/08/16 1122  . pantoprazole (PROTONIX) EC tablet 40 mg  40 mg Oral Daily DLytle Butte MD   40 mg at 02/08/16 0954  . sodium chloride flush (NS) 0.9 % injection 3 mL  3 mL Intravenous Q12H DLytle Butte MD   3 mL at 02/08/16 1000  . traMADol (ULTRAM) tablet 50 mg  50 mg Oral Q6H PRN  Theodoro Grist, MD        Musculoskeletal: Strength & Muscle Tone: decreased Gait & Station: unable to stand Patient leans: N/A  Psychiatric Specialty Exam: Physical Exam  Nursing note and vitals reviewed. Constitutional: She appears well-developed and well-nourished.  HENT:  Head: Normocephalic and atraumatic.  Eyes: Conjunctivae are normal. Pupils are equal, round, and reactive to light.  Neck: Normal range of motion.  Cardiovascular: Normal heart sounds.   Respiratory: She is in respiratory distress.  GI: Soft.  Musculoskeletal: Normal range of motion.  Neurological: She is alert.  Skin: Skin is warm and dry.  Psychiatric: Judgment normal. Her affect is blunt. Her speech is delayed. She is slowed. Thought content is not paranoid. Cognition and memory are normal. She expresses no homicidal and no suicidal ideation.    Review of Systems  Constitutional: Negative.   HENT: Negative.   Eyes: Negative.   Respiratory: Negative.   Cardiovascular: Positive for palpitations.  Gastrointestinal: Positive for abdominal pain.  Musculoskeletal: Negative.   Skin: Negative.   Neurological: Negative.   Psychiatric/Behavioral: Negative.  Negative for depression.    Blood pressure (!) 154/64, pulse 71, temperature 97.8 F (36.6 C), temperature source Oral, resp. rate 17, height 5' 4"  (1.626 m), weight 71.8 kg (158 lb 3.2 oz), SpO2 99 %.Body mass index is 27.15 kg/m.  General Appearance: Disheveled  Eye Contact:  Minimal  Speech:  Slow   Volume:  Decreased  Mood:  Euthymic  Affect:  Blunt  Thought Process:  Goal Directed  Orientation:  Full (Time, Place, and Person)  Thought Content:  Tangential  Suicidal Thoughts:  No  Homicidal Thoughts:  No  Memory:  Immediate;   Fair Recent;   Fair Remote;   Fair  Judgement:  Fair  Insight:  Fair  Psychomotor Activity:  Decreased  Concentration:  Concentration: Fair  Recall:  [employer] of Knowledge:  Fair  Language:  Fair  Akathisia:  No  Handed:  Right  AIMS (if indicated):     Assets:  Communication Skills Desire for Improvement Housing Social Support  ADL's:  Impaired  Cognition:  Impaired,  Mild  Sleep:        Treatment Plan Summary: Plan 75 year old woman with multiple medical problems. Patient seems to be a little bit slow in her thinking although some of that may be from current medication. She is not reporting any symptoms of depression or psychosis. As far as capacity I think that the patient certainly has the capacity to make decisions about medications that she would take although she probably needs to have pros and cons of medicines carefully described to her. It sounds like there has been a system in place in the past to make sure she was staying on her medicine appropriately and that may have recently fallen through. Family might want to look into making sure she is getting all of her medicines from the drugstore correctly. No need however for any specific psychiatric treatment. I will follow-up only as needed.  Disposition: Supportive therapy provided about ongoing stressors.  Alethia Berthold, MD 02/08/2016 6:01 PM

## 2016-02-08 NOTE — Plan of Care (Signed)
Problem: Safety: Goal: Ability to remain free from injury will improve Outcome: Progressing Patient continue to be impulsive at times, trying to get out of bed and to the [facility] before assistance has arrived. Continuing to educate patient on safety; hourly rounding being completed by nurse and NT to meet needs

## 2016-02-08 NOTE — Progress Notes (Signed)
Patient is drowsy but easily arousable. Patient has periods of confusion, she is disoriented to place and situation. Heart rate has been stable. Complained of right flank pain X1 but refused pain medication. Patient is weak and unsteady on her feet. Patient is impulsive and requires redirection. Patient requires frequent monitoring.

## 2016-02-08 NOTE — Evaluation (Signed)
Physical Therapy Evaluation Patient Details Name: Robin [name] MRN: [id number] DOB: 10/01/1940 Today's Date: 02/08/2016   History of Present Illness  Pt is a 75 y/o F with known h/o a-fib who reports "I got sick" and unable to provide more information.  Pt presents with SOB and was noted to be tachycardic, hypertensive, found to have renal infarct on imaging, due to thromboembolism.  Heparin therapy initiated.  Pt's PMH includes anxiety, CHF, anxiety.    Clinical Impression  Pt admitted with above diagnosis. Pt currently with functional limitations due to the deficits listed below (see PT Problem List). Ms. Robin NoraDixon presents very lethargic and at times closing her eyes during evaluation.  She does express desire to get to the chair and is agreeable to therapy.  She requires mod assist for bed mobility and up to mod assist to maintain balance sitting EOB.  Min assist provided to pivot to chair.  She is a poor historian but reports that PLOF includes ambulating with cane most of the time and RW occasionally, although her [facility] when out in community.  She lives with her son but son is away at work during the day.  Given pt's current mobility status, recommending SNF at d/c. Pt will benefit from skilled PT to increase their independence and safety with mobility to allow discharge to the venue listed below.      Follow Up Recommendations SNF    Equipment Recommendations  None recommended by PT    Recommendations for Other Services       Precautions / Restrictions Precautions Precautions: Fall Restrictions Weight Bearing Restrictions: No      Mobility  Bed Mobility Overal bed mobility: Needs Assistance Bed Mobility: Supine to Sit     Supine to sit: Mod assist;HOB elevated     General bed mobility comments: Cues for sequencing and pt requires assist to elevate trunk and to advance LEs to EOB.  Bed pad used to scoot pt sitting EOB.  Transfers Overall transfer  level: Needs assistance Equipment used: Rolling walker (2 wheeled) Transfers: Sit to/from [employer]Stand;Stand Pivot Transfers Sit to Stand: Min assist Stand pivot transfers: Min assist       General transfer comment: Min assist provided to boost to standing and to steady as pt stand and pivots to chair.  She does so with increased time and directional cues.  Ambulation/Gait             General Gait Details: pt declined ambulating x2  Stairs            Wheelchair Mobility    Modified Rankin (Stroke Patients Only)       Balance Overall balance assessment: Needs assistance Sitting-balance support: Bilateral upper extremity supported;Feet supported Sitting balance-Leahy Scale: Poor Sitting balance - Comments: Pt with R lateral and posterior lean and and requires up to mod assist to return to upright after LOB x4 Postural control: Right lateral lean;Posterior lean Standing balance support: Bilateral upper extremity supported;During functional activity Standing balance-Leahy Scale: Poor Standing balance comment: Relies on RW and outside physical assist for support                             Pertinent Vitals/Pain Pain Assessment: 0-10 Pain Score: 8  Pain Location: Bil knees (pt attributes to her restless leg) Pain Descriptors / Indicators: Aching Pain Intervention(s): Limited activity within patient's tolerance;Monitored during session;Repositioned    Home Living Family/patient expects to  be discharged to:: Private residence Living Arrangements: Children (son) Available Help at Discharge: Family;Available PRN/intermittently (son works during the day) Type of Home: Apartment Home Access: Stairs to enter Entrance Stairs-Rails:  ("in the middle") Entrance Stairs-Number of Steps: 3 Home Layout: One level Home Equipment: Environmental consultant - 2 wheels;Cane - quad;Bedside commode;Shower seat;Wheelchair - manual      Prior Function Level of Independence: Needs assistance    Gait / Transfers Assistance Needed: Ambulates with cane most of the time, RW when tired.  Son pushes her in [facility] when out in community, pt initially says thisi s due to fatigue but later says it is due to pain.  ADL's / Homemaking Assistance Needed: Pt independent with bathing, dressing.  Pt doesn't drive.        Hand Dominance   Dominant Hand: Right    Extremity/Trunk Assessment   Upper Extremity Assessment: RUE deficits/detail;LUE deficits/detail RUE Deficits / Details: strength grossly 4/5     LUE Deficits / Details: strength grossly 4/5   Lower Extremity Assessment: RLE deficits/detail;LLE deficits/detail RLE Deficits / Details: strength grossly 4/5 LLE Deficits / Details: strength grossly 4/5  Cervical / Trunk Assessment: Kyphotic  Communication   Communication:  (slightly slurred likely due to lethargy)  Cognition Arousal/Alertness: Lethargic;Suspect due to medications Behavior During Therapy: Flat affect Overall Cognitive Status: No family/caregiver present to determine baseline cognitive functioning Area of Impairment: Orientation;Attention;Memory;Following commands;Safety/judgement;Awareness;Problem solving Orientation Level: Disoriented to;Time (Initially reports the year as 2005) Current Attention Level: Focused Memory: Decreased short-term memory Following Commands: Follows one step commands inconsistently;Follows one step commands with increased time Safety/Judgement: Decreased awareness of safety Awareness: Intellectual Problem Solving: Slow processing;Difficulty sequencing;Requires verbal cues;Requires tactile cues;Decreased initiation General Comments: Pt not correcting posterior bias posture sitting EOB and not initiating movement.  She is very lethargic and at one point while answering questions says, "I'm tired, I'm going to sleep now".  She is a poor historian and provides contradictory information.      General Comments      Exercises General  Exercises - Upper Extremity Shoulder Flexion: AROM;Both;10 reps;Seated General Exercises - Lower Extremity Long Arc Quad: AROM;Both;10 reps;Seated   Assessment/Plan    PT Assessment Patient needs continued PT services  PT Problem List Decreased strength;Decreased activity tolerance;Decreased balance;Decreased mobility;Decreased cognition;Decreased knowledge of use of DME;Decreased safety awareness;Pain;Cardiopulmonary status limiting activity          PT Treatment Interventions DME instruction;Gait training;Stair training;Functional mobility training;Therapeutic activities;Therapeutic exercise;Balance training;Cognitive remediation;Patient/family education;Modalities    PT Goals (Current goals can be found in the Care Plan section)  Acute Rehab PT Goals Patient Stated Goal: does not state PT Goal Formulation: Patient unable to participate in goal setting Time For Goal Achievement: 02/22/16 Potential to Achieve Goals: Good    Frequency Min 2X/week   Barriers to discharge Decreased caregiver support;Inaccessible home environment 3 steps to enter her home and pt is alone during the day    Co-evaluation               End of Session Equipment Utilized During Treatment: Gait belt;Oxygen Activity Tolerance: Patient limited by lethargy Patient left: in chair;with call bell/phone within reach;with chair alarm set Nurse Communication: Mobility status;Other (comment) (lethargy)         Time: 6962-9528 PT Time Calculation (min) (ACUTE ONLY): 27 min   Charges:   PT Evaluation $PT Eval Low Complexity: 1 Procedure PT Treatments $Therapeutic Activity: 8-22 mins   PT G Codes:        Encarnacion Chu PT,  DPT 02/08/2016, 4:14 PM

## 2016-02-08 NOTE — Progress Notes (Signed)
[facility] [place] at [facility]   PATIENT NAME: Robin [name]    MR#:  [id number]  DATE OF BIRTH:  02-25-1941  SUBJECTIVE:  CHIEF COMPLAINT:   Chief Complaint  Patient presents with  . Nausea  . Diarrhea  Patient is a 75 year old Caucasian female with past medical history significant for history of atrial fibrillation, anxiety, asthma, CHF, coronary artery disease, diabetes, hypertension, hyperlipidemia, who is supposed to be on anticoagulation with Xarelto, but not sure if she takes it, who presents to the hospital with complaints of right-sided abdominal pain, flank pain for the past few days, nausea, vomiting and diarrhea. On arrival to the hospital. 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 She is also having nausea, suspected due to oxycodone, requests different pain medications. The patient was seen by psychiatrist, who felt that she is competent to make decisions about medical care, who recommended to discuss medication side effects as well as indications. Patient will be going back home with home health services, nursing staff, who will be able to educate patient about medications moore, physical therapy   Review of Systems  Unable to perform ROS: Medical condition  Gastrointestinal: Positive for abdominal pain and nausea.    VITAL SIGNS: Blood pressure (!) 154/64, pulse 71, temperature 97.8 F (36.6 C), temperature source Oral, resp. rate 17, height 5\' 4"  (1.626 m), weight 71.8 kg (158 lb 3.2 oz), SpO2 99 %.  PHYSICAL EXAMINATION:   GENERAL:  75 y.o.-year-old patient lying in the be in mild to moderate distress, some somnolent, nauseated, somewhat uncomfortable EYES: Pupils equal, round, reactive to light and accommodation. No scleral icterus. Extraocular muscles intact.  HEENT:  Head atraumatic, normocephalic. Oropharynx and nasopharynx clear.  NECK:  Supple, no jugular venous distention. No thyroid enlargement, no tenderness.  LUNGS: Normal breath sounds bilaterally, no wheezing, few scattered rales,rhonchi and crepitations noted diffusely. No use of accessory muscles of respiration.  CARDIOVASCULAR: S1, S2 normal. No murmurs, rubs, or gallops.  ABDOMEN: Soft, tender in right side, no rebound or guarding was noted,mild  tenderness on right flank percussion, nondistended. Bowel sounds present. No organomegaly or mass.  EXTREMITIES: 1+ lower extremity and pedal edema on the left, less so on the right, no cyanosis, or clubbing.  NEUROLOGIC: Cranial nerves II through XII are intact. Muscle strength 5/5 in all extremities. Sensation intact. Gait not checked.  PSYCHIATRIC: The patient is alert and oriented x 3.  SKIN: No obvious rash, lesion, or ulcer.   ORDERS/RESULTS REVIEWED:   CBC  Recent Labs Lab 02/06/16 1516 02/07/16 0556 02/08/16 0610  WBC 19.3* 17.6* 24.6*  HGB 12.4 12.8 11.8*  HCT 37.1 39.2 36.2  PLT 223 257 243  MCV 83.8 86.0 84.7  MCH 28.1 28.2 27.7  MCHC 33.5 32.8 32.7  RDW 14.6* 14.3 14.5  LYMPHSABS 1.5  --   --   MONOABS 0.8  --   --   EOSABS 0.1  --   --   BASOSABS 0.1  --   --    ------------------------------------------------------------------------------------------------------------------  Chemistries   Recent Labs Lab 02/06/16 1516 02/07/16 0556 02/08/16 0610  NA 140 138  --   K 3.6 4.0  --   CL 109 102  --   CO2 23 22  --   GLUCOSE 236* 404*  --   BUN 20 22*  --  CREATININE 0.52 1.01* 0.98  CALCIUM 8.2* 8.7*  --   AST 26  --   --   ALT 30  --   --   ALKPHOS 130*  --   --   BILITOT 0.2*  --   --    ------------------------------------------------------------------------------------------------------------------ estimated creatinine clearance is 48.2 mL/min (by C-G formula based on SCr of 0.98  mg/dL). ------------------------------------------------------------------------------------------------------------------ No results for input(s): TSH, T4TOTAL, T3FREE, THYROIDAB in the last 72 hours.  Invalid input(s): FREET3  Cardiac Enzymes  Recent Labs Lab 02/06/16 1516  TROPONINI <0.03   ------------------------------------------------------------------------------------------------------------------ Invalid input(s): POCBNP ---------------------------------------------------------------------------------------------------------------  RADIOLOGY: Dg Chest 2 View  Result Date: 02/06/2016 CLINICAL DATA:  Chest pain starting 12 p.m. today EXAM: CHEST  2 VIEW COMPARISON:  01/24/2016 FINDINGS: Cardiomegaly is noted. No infiltrate or pleural effusion. No pulmonary edema. Osteopenia and mild degenerative change thoracic spine. Status post median sternotomy. IMPRESSION: No active cardiopulmonary disease. Cardiomegaly again noted. Status post median sternotomy. Electronically Signed   By: Natasha Mead M.D.   On: 02/06/2016 16:31   Ct Abdomen Pelvis W Contrast  Result Date: 02/06/2016 CLINICAL DATA:  Nausea, vomiting and diarrhea for 1 hour. Hypertensive. History of hypertension, diabetes. EXAM: CT ABDOMEN AND PELVIS WITH CONTRAST TECHNIQUE: Multidetector CT imaging of the abdomen and pelvis was performed using the standard protocol following bolus administration of intravenous contrast. CONTRAST:  ISOVUE-300 IOPAMIDOL (ISOVUE-300) INJECTION 61% COMPARISON:  Acute abdominal series February 06, 2016 at 1614 hours FINDINGS: LOWER CHEST: Small bilateral pleural effusions. Interlobular septal thickening most consistent with chronic CHF. The heart is at least mildly enlarged. No pericardial effusions. HEPATOBILIARY: The liver is diffusely hypodense compatible with steatosis with mild focal fatty sparing about the gallbladder fossa. Gallbladder is normal. PANCREAS: Normal. SPLEEN: Normal.  ADRENALS/URINARY TRACT: Kidneys are orthotopic demonstrate normal enhancement. Hyper perfused RIGHT kidney with minimal enhancement on early phase, slightly increased on delayed phase. Thromboembolism RIGHT renal artery from the origin distally. RIGHT interpolar renal scarring. Too small to characterize hypodensity lower pole LEFT kidney. 3 mm nonobstructing RIGHT lower pole and 2 mm RIGHT upper pole nephrolithiasis. Urinary bladder is well distended and unremarkable. STOMACH/BOWEL: The stomach, small and large bowel are normal in course and caliber without inflammatory changes. Moderate sigmoid diverticulosis. Normal appendix. VASCULAR/LYMPHATIC: Aortoiliac vessels are normal in course and caliber, severe atherosclerosis. 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 No lymphadenopathy by CT size criteria. REPRODUCTIVE: Normal. OTHER: Small amount of low-density free fluid in the pelvis. No focal fluid collection, no intraperitoneal free air. MUSCULOSKELETAL: Nonacute. Moderate degenerative change of LEFT hip. 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 Anterior abdominal wall scarring with small fat containing umbilical hernia. 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 Severe atherosclerosis. 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 Colonic diverticulosis without acute diverticulitis. Small bilateral pleural effusions. Acute findings discussed with and reconfirmed by Dr.JAMES MCSHANE on 02/06/2016 at 6:55 pm. Electronically Signed   By: Awilda Metro M.D.   On: 02/06/2016 18:59   US Venous Img Lower Unilateral Left  Result Date: 02/07/2016 CLINICAL DATA:  Left lower extremity swelling EXAM: LEFT LOWER EXTREMITY VENOUS DUPLEX ULTRASOUND TECHNIQUE: Doppler venous assessment of the left lower extremity deep venous system was performed, including  characterization of spectral flow, compressibility, and phasicity. COMPARISON:  None. FINDINGS: There is complete compressibility of the left common femoral, femoral, and popliteal veins. Doppler analysis demonstrates respiratory phasicity and augmentation of flow with calf compression. No  obvious superficial vein or calf vein thrombosis. IMPRESSION: No evidence of left lower extremity DVT. Electronically Signed   By: Jolaine ClickArthur  Hoss M.D.   On: 02/07/2016 17:29   Dg Abd 2 Views  Result Date: 02/06/2016 CLINICAL DATA:  Initial evaluation for acute abdominal pain, fever. EXAM: ABDOMEN - 2 VIEW COMPARISON:  Prior radiograph from 05/07/2014. FINDINGS: Bowel gas pattern within normal limits without evidence for obstruction or ileus. No abnormal bowel wall thickening. No free air. No soft tissue mass or abnormal calcification. Median sternotomy wires noted. Scattered degenerative changes noted within the visualized spine. Degenerative changes noted about the hips bilaterally as well. IMPRESSION: Nonobstructive bowel gas pattern with no radiographic evidence for acute intra-abdominal or pelvic process. Electronically Signed   By: Rise MuBenjamin  McClintock M.D.   On: 02/06/2016 16:33    EKG:  Orders placed or performed during the hospital encounter of 02/06/16  . EKG 12-Lead  . EKG 12-Lead    ASSESSMENT AND PLAN:  Principal Problem:   Noncompliance Active Problems:   Renal infarct [facility](HCC)   Atrial fibrillation with rapid ventricular response (HCC)  #1.  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 Right-sided abdominal pain, likely due to acute renal infarct, continue pain medications, . Change oxycodone to tramadol, follow clinically . Vascular surgeon feels that SMA occlusion of the size may  not result in symptomatology, final recommendations are pending #3. Acute renal insufficiency, resolved on IV fluids  #4 left  lower extremity swelling, Doppler ultrasound showed no  dvt #5. Leukocytosis, questionable reactive, follow closely #6. Metabolic encephalopathy, improved with therapy, change oxycodone to tramadol, follow closely, psychiatry saw patient in consultation and felt that patient is competent to make decisions for medical care, patient will need to have a home health services, RN to discuss patient's medications and indications #7. Nausea, likely related to oxycodone, continue supportive therapy, initiate IV fluids if needed, change oxycodone to tramadol, follow closely   Management plans discussed with the patient, family and they are in agreement.   DRUG ALLERGIES: No Known Allergies  CODE STATUS:     Code Status Orders        Start     Ordered   02/06/16 1917  Full code  Continuous     02/06/16 1917    Code Status History    Date Active Date Inactive Code Status Order ID Comments User Context   01/24/2016  2:00 PM 01/25/2016  7:15 PM Full Code [id number]  [place] BilberryShreyang Patel, MD Inpatient   11/25/2015  3:40 PM 11/26/2015  3:49 PM Full Code [id number]  [place] SirenVivek J Sainani, MD Inpatient   11/05/2015 12:33 AM 11/06/2015  6:05 PM Full Code [id number]  Oralia Manisavid Willis, MD Inpatient   09/13/2015 10:10 AM 09/15/2015  7:33 PM Full Code [id number]  Altamese DillingVaibhavkumar Vachhani, MD Inpatient      TOTAL TIME TAKING CARE OF THIS PATIENT: 35 minutes.    Katharina CaperVAICKUTE,Alenna Russell M.D on 02/08/2016 at 2:46 PM  Between 7am to 6pm - Pager - [phone]  After 6pm go to [email or web address] - password [email or web address] [facility]  [facility]  Office  [phone]  CC: Primary care physician; Leanna SatoMILES,LINDA M, MD

## 2016-02-08 NOTE — Progress Notes (Signed)
ANTICOAGULATION CONSULT NOTE - Initial Consult  Pharmacy Consult for heparin drip Indication: renal infarct  No Known Allergies  Patient Measurements: Height: 5\' 4"  (162.6 cm) Weight: 158 lb 3.2 oz (71.8 kg) IBW/kg (Calculated) : 54.7 Heparin Dosing Weight: 69 kg  Vital Signs: Temp: 98.9 F (37.2 C) (11/29 0405) Temp Source: Oral (11/29 0405) BP: 159/72 (11/29 0405) Pulse Rate: 97 (11/29 0405)  Labs:  Recent Labs  02/06/16 1516  02/07/16 0556 02/07/16 1347 02/07/16 2226 02/08/16 0610  HGB 12.4  --  12.8  --   --  11.8*  HCT 37.1  --  39.2  --   --  36.2  PLT 223  --  257  --   --  243  APTT  --   --  63*  --   --   --   LABPROT 13.1  --   --   --   --   --   INR 0.99  --   --   --   --   --   HEPARINUNFRC  --   < > 0.45 0.70 0.51 0.37  CREATININE 0.52  --  1.01*  --   --  0.98  TROPONINI <0.03  --   --   --   --   --   < > = values in this interval not displayed.  Estimated Creatinine Clearance: 48.2 mL/min (by C-G formula based on SCr of 0.98 mg/dL).   Medical History: Past Medical History:  Diagnosis Date  . Afib (HCC)   . Anxiety   . Asthma   . CHF (congestive heart failure) (HCC)   . Coronary artery disease   . Diabetes mellitus without complication (HCC)   . Hyperlipidemia   . Hypertension     Medications:  Prescriptions Prior to Admission  Medication Sig Dispense Refill Last Dose  . diltiazem (CARDIZEM CD) 180 MG 24 hr capsule Take 1 capsule (180 mg total) by mouth daily. 30 capsule 0 02/06/2016 at 0800  . lisinopril (PRINIVIL,ZESTRIL) 20 MG tablet Take 1 tablet (20 mg total) by mouth daily. 30 tablet 0 02/06/2016 at 0800  . metoprolol tartrate (LOPRESSOR) 25 MG tablet Take 1 tablet (25 mg total) by mouth 2 (two) times daily. 30 tablet 0 02/06/2016 at 0800  . pantoprazole (PROTONIX) 40 MG tablet Take 1 tablet (40 mg total) by mouth daily. (Patient taking differently: Take 40 mg by mouth daily as needed. For acid reflux) 30 tablet 2 02/06/2016 at  0800  . albuterol (ACCUNEB) 1.25 MG/3ML nebulizer solution Take 1.25 mg by nebulization every 4 (four) hours as needed for wheezing or shortness of breath.    prn at prn  . meclizine (ANTIVERT) 25 MG tablet Take 1 tablet (25 mg total) by mouth 3 (three) times daily as needed for dizziness. 30 tablet 0 prn at prn  . nitroGLYCERIN (NITROSTAT) 0.4 MG SL tablet Place 1 tablet (0.4 mg total) under the tongue every 5 (five) minutes as needed for chest pain. 30 tablet 0 prn at prn  . ondansetron (ZOFRAN ODT) 4 MG disintegrating tablet Take 1 tablet (4 mg total) by mouth every 8 (eight) hours as needed for nausea or vomiting. 20 tablet 0 prn at prn  . rivaroxaban (XARELTO) 20 MG TABS tablet Take 1 tablet (20 mg total) by mouth daily. (Patient not taking: Reported on 02/07/2016) 30 tablet 0 Not Taking   Scheduled:  . diltiazem  180 mg Oral BID  . insulin aspart  0-15 Units Subcutaneous  TID WC  . insulin aspart  0-5 Units Subcutaneous QHS  . metoprolol tartrate  50 mg Oral Q6H  . pantoprazole  40 mg Oral Daily  . sodium chloride flush  3 mL Intravenous Q12H   Infusions:  . 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  Patient was prescribed rivaroxaban prior to admission - unclear if she has been taking it. Patient has a history of noncompliance. She told the MD that she was not taking rivaroxaban, but reported to pharmacy that she took a dose this morning.   Based on heparin level < 0.10 appears that she was not taking   Goal of Therapy:  Heparin level 0.3-0.7 units/ml Monitor platelets by anticoagulation protocol: Yes   Plan:  Current orders for heparin 1,100 units/hr. Heparin level 0.37, therapeutic but trending down. Patient with active thrombus, would like make sure she remains adequately anticoagulated. Will recheck in 8  hours.   Martyn Malay, PharmD, BCPS Clinical Pharmacist 02/08/2016,7:37 AM

## 2016-02-08 NOTE — Progress Notes (Signed)
ANTICOAGULATION CONSULT NOTE - Initial Consult  Pharmacy Consult for heparin drip Indication: renal infarct  No Known Allergies  Patient Measurements: Height: 5\' 4"  (162.6 cm) Weight: 158 lb 3.2 oz (71.8 kg) IBW/kg (Calculated) : 54.7 Heparin Dosing Weight: 69 kg  Vital Signs: Temp: 98.5 F (36.9 C) (11/28 1943) Temp Source: Oral (11/28 1943) BP: 157/90 (11/28 1943) Pulse Rate: 119 (11/28 1943)  Labs:  Recent Labs  02/06/16 1516  02/07/16 0556 02/07/16 1347 02/07/16 2226  HGB 12.4  --  12.8  --   --   HCT 37.1  --  39.2  --   --   PLT 223  --  257  --   --   APTT  --   --  63*  --   --   LABPROT 13.1  --   --   --   --   INR 0.99  --   --   --   --   HEPARINUNFRC  --   < > 0.45 0.70 0.51  CREATININE 0.52  --  1.01*  --   --   TROPONINI <0.03  --   --   --   --   < > = values in this interval not displayed.  Estimated Creatinine Clearance: 46.7 mL/min (by C-G formula based on SCr of 1.01 mg/dL (H)).   Medical History: Past Medical History:  Diagnosis Date  . Afib (HCC)   . Anxiety   . Asthma   . CHF (congestive heart failure) (HCC)   . Coronary artery disease   . Diabetes mellitus without complication (HCC)   . Hyperlipidemia   . Hypertension     Medications:  Prescriptions Prior to Admission  Medication Sig Dispense Refill Last Dose  . diltiazem (CARDIZEM CD) 180 MG 24 hr capsule Take 1 capsule (180 mg total) by mouth daily. 30 capsule 0 02/06/2016 at 0800  . lisinopril (PRINIVIL,ZESTRIL) 20 MG tablet Take 1 tablet (20 mg total) by mouth daily. 30 tablet 0 02/06/2016 at 0800  . metoprolol tartrate (LOPRESSOR) 25 MG tablet Take 1 tablet (25 mg total) by mouth 2 (two) times daily. 30 tablet 0 02/06/2016 at 0800  . pantoprazole (PROTONIX) 40 MG tablet Take 1 tablet (40 mg total) by mouth daily. (Patient taking differently: Take 40 mg by mouth daily as needed. For acid reflux) 30 tablet 2 02/06/2016 at 0800  . albuterol (ACCUNEB) 1.25 MG/3ML nebulizer  solution Take 1.25 mg by nebulization every 4 (four) hours as needed for wheezing or shortness of breath.    prn at prn  . meclizine (ANTIVERT) 25 MG tablet Take 1 tablet (25 mg total) by mouth 3 (three) times daily as needed for dizziness. 30 tablet 0 prn at prn  . nitroGLYCERIN (NITROSTAT) 0.4 MG SL tablet Place 1 tablet (0.4 mg total) under the tongue every 5 (five) minutes as needed for chest pain. 30 tablet 0 prn at prn  . ondansetron (ZOFRAN ODT) 4 MG disintegrating tablet Take 1 tablet (4 mg total) by mouth every 8 (eight) hours as needed for nausea or vomiting. 20 tablet 0 prn at prn  . rivaroxaban (XARELTO) 20 MG TABS tablet Take 1 tablet (20 mg total) by mouth daily. (Patient not taking: Reported on 02/07/2016) 30 tablet 0 Not Taking   Scheduled:  . diltiazem  180 mg Oral BID  . insulin aspart  0-15 Units Subcutaneous TID WC  . insulin aspart  0-5 Units Subcutaneous QHS  . metoprolol tartrate  50 mg  Oral Q6H  . pantoprazole  40 mg Oral Daily  . sodium chloride flush  3 mL Intravenous Q12H   Infusions:  . 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  Patient was prescribed rivaroxaban prior to admission - unclear if she has been taking it. Patient has a history of noncompliance. She told the MD that she was not taking rivaroxaban, but reported to pharmacy that she took a dose this morning.   Based on heparin level < 0.10 appears that she was not taking   Goal of Therapy:  Heparin level 0.3-0.7 units/ml Monitor platelets by anticoagulation protocol: Yes   Plan:  Current orders for heparin 1,100 units/hr. Second heparin level 0.70 - upper limit of therapeutic, significant increase from first level. Will recheck in 8 hours to confirm. Repeat CBC with AM labs.  11/28 PM heparin level 0.51. Continue current  regimen. Recheck heparin level and CBC with AM labs.  Gatha Mcnulty S, PharmD, BCPS Clinical Pharmacist 02/08/2016,12:10 AM

## 2016-02-08 NOTE — Progress Notes (Signed)
ANTICOAGULATION CONSULT NOTE - Initial Consult  Pharmacy Consult for heparin drip Indication: renal infarct  No Known Allergies  Patient Measurements: Height: 5\' 4"  (162.6 cm) Weight: 158 lb 3.2 oz (71.8 kg) IBW/kg (Calculated) : 54.7 Heparin Dosing Weight: 69 kg  Vital Signs: Temp: 97.8 F (36.6 C) (11/29 1156) Temp Source: Oral (11/29 1156) BP: 154/64 (11/29 1152) Pulse Rate: 71 (11/29 1152)  Labs:  Recent Labs  02/06/16 1516  02/07/16 0556  02/07/16 2226 02/08/16 0610 02/08/16 1414  HGB 12.4  --  12.8  --   --  11.8*  --   HCT 37.1  --  39.2  --   --  36.2  --   PLT 223  --  257  --   --  243  --   APTT  --   --  63*  --   --   --   --   LABPROT 13.1  --   --   --   --   --   --   INR 0.99  --   --   --   --   --   --   HEPARINUNFRC  --   < > 0.45  < > 0.51 0.37 0.31  CREATININE 0.52  --  1.01*  --   --  0.98  --   TROPONINI <0.03  --   --   --   --   --   --   < > = values in this interval not displayed.  Estimated Creatinine Clearance: 48.2 mL/min (by C-G formula based on SCr of 0.98 mg/dL).   Medical History: Past Medical History:  Diagnosis Date  . Afib (HCC)   . Anxiety   . Asthma   . CHF (congestive heart failure) (HCC)   . Coronary artery disease   . Diabetes mellitus without complication (HCC)   . Hyperlipidemia   . Hypertension     Medications:  Prescriptions Prior to Admission  Medication Sig Dispense Refill Last Dose  . diltiazem (CARDIZEM CD) 180 MG 24 hr capsule Take 1 capsule (180 mg total) by mouth daily. 30 capsule 0 02/06/2016 at 0800  . lisinopril (PRINIVIL,ZESTRIL) 20 MG tablet Take 1 tablet (20 mg total) by mouth daily. 30 tablet 0 02/06/2016 at 0800  . metoprolol tartrate (LOPRESSOR) 25 MG tablet Take 1 tablet (25 mg total) by mouth 2 (two) times daily. 30 tablet 0 02/06/2016 at 0800  . pantoprazole (PROTONIX) 40 MG tablet Take 1 tablet (40 mg total) by mouth daily. (Patient taking differently: Take 40 mg by mouth daily as needed.  For acid reflux) 30 tablet 2 02/06/2016 at 0800  . albuterol (ACCUNEB) 1.25 MG/3ML nebulizer solution Take 1.25 mg by nebulization every 4 (four) hours as needed for wheezing or shortness of breath.    prn at prn  . meclizine (ANTIVERT) 25 MG tablet Take 1 tablet (25 mg total) by mouth 3 (three) times daily as needed for dizziness. 30 tablet 0 prn at prn  . nitroGLYCERIN (NITROSTAT) 0.4 MG SL tablet Place 1 tablet (0.4 mg total) under the tongue every 5 (five) minutes as needed for chest pain. 30 tablet 0 prn at prn  . ondansetron (ZOFRAN ODT) 4 MG disintegrating tablet Take 1 tablet (4 mg total) by mouth every 8 (eight) hours as needed for nausea or vomiting. 20 tablet 0 prn at prn  . rivaroxaban (XARELTO) 20 MG TABS tablet Take 1 tablet (20 mg total) by mouth daily. (Patient not  taking: Reported on 02/07/2016) 30 tablet 0 Not Taking   Scheduled:  . diltiazem  180 mg Oral BID  . feeding supplement (ENSURE ENLIVE)  237 mL Oral BID BM  . insulin aspart  0-15 Units Subcutaneous TID WC  . insulin aspart  0-5 Units Subcutaneous QHS  . metoprolol tartrate  50 mg Oral Q6H  . pantoprazole  40 mg Oral Daily  . sodium chloride flush  3 mL Intravenous Q12H   Infusions:  . 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  Patient was prescribed rivaroxaban prior to admission - unclear if she has been taking it. Patient has a history of noncompliance. She told the MD that she was not taking rivaroxaban, but reported to pharmacy that she took a dose this morning.   Based on heparin level < 0.10 appears that she was not taking   Goal of Therapy:  Heparin level 0.3-0.7 units/ml Monitor platelets by anticoagulation protocol: Yes   Plan:  Current orders for heparin 1,100 units/hr. Patient has been at same rate, however Heparin level has  been trending down now 0.31. Therapeutic, however would prefer to be at the higher end of therapeutic range due to active thrombus. Will increase to heparin 1,150 units/hr and recheck heparin level in 8 hours. CBC with AM labs.   Martyn MalayBarefoot,Ceanna Wareing C, PharmD, BCPS Clinical Pharmacist 02/08/2016,3:15 PM

## 2016-02-09 DIAGNOSIS — K559 Vascular disorder of intestine, unspecified: Secondary | ICD-10-CM

## 2016-02-09 DIAGNOSIS — I4891 Unspecified atrial fibrillation: Secondary | ICD-10-CM

## 2016-02-09 DIAGNOSIS — R1010 Upper abdominal pain, unspecified: Secondary | ICD-10-CM

## 2016-02-09 LAB — HEPARIN LEVEL (UNFRACTIONATED)
Heparin Unfractionated: 0.37 IU/mL (ref 0.30–0.70)
Heparin Unfractionated: 0.11 IU/mL — ABNORMAL LOW (ref 0.30–0.70)
Heparin Unfractionated: 0.31 IU/mL (ref 0.30–0.70)

## 2016-02-09 LAB — CBC
HEMATOCRIT: 34.4 % — AB (ref 35.0–47.0)
Hemoglobin: 11.5 g/dL — ABNORMAL LOW (ref 12.0–16.0)
MCH: 27.7 pg (ref 26.0–34.0)
MCHC: 33.4 g/dL (ref 32.0–36.0)
MCV: 82.9 fL (ref 80.0–100.0)
Platelets: 221 10*3/uL (ref 150–440)
RBC: 4.15 MIL/uL (ref 3.80–5.20)
RDW: 14.2 % (ref 11.5–14.5)
WBC: 32 10*3/uL — ABNORMAL HIGH (ref 3.6–11.0)

## 2016-02-09 LAB — URINE CULTURE

## 2016-02-09 LAB — GLUCOSE, CAPILLARY
GLUCOSE-CAPILLARY: 199 mg/dL — AB (ref 65–99)
Glucose-Capillary: 223 mg/dL — ABNORMAL HIGH (ref 65–99)
Glucose-Capillary: 266 mg/dL — ABNORMAL HIGH (ref 65–99)
Glucose-Capillary: 212 mg/dL — ABNORMAL HIGH (ref 65–99)

## 2016-02-09 MED ORDER — HEPARIN BOLUS VIA INFUSION
2000.0000 [IU] | Freq: Once | INTRAVENOUS | Status: AC
  Administered 2016-02-09: 2000 [IU] via INTRAVENOUS
  Filled 2016-02-09: qty 2000

## 2016-02-09 NOTE — Progress Notes (Signed)
Pt refused Diltiazem. Educated pt of indications of the medication. Will continue to monitor and assess

## 2016-02-09 NOTE — Consult Note (Signed)
[facility] Vascular Consult Note  MRN : [id number]  Robin [name] is a 75 y.o. (1940/10/24) female who presents with chief complaint of  Chief Complaint  Patient presents with  . Nausea  . Diarrhea  .  History of Present Illness: I am asked to evaluate [name] by Dr. 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  The patient was admitted 2 days ago with vague complaints.  Initial workup revealed she was tachycardic in atrial fibrillation with rapid ventricular response hypertensive she was complaining of abdominal and flank pain and CT scan was performed given her vagueness and somnolence. CT scan demonstrated a occlusion of the right renal artery with infarction of the right kidney as well as significant stenosis of the mesenteric vessels. It should be noted that this was not a CT angiogram and therefore the imaging of the mesenteric vessels is not optimal. Yesterday she was still noted to have a vague blunted affect making obtaining history very difficult but she did admit to abdominal pain after she eats. She also admits to episodes of diarrhea sometimes with some blood. I discussion with the patient this evening she is more alert after introducing myself she sat up and engaged me in conversation. Although she is still somewhat vague about her symptoms she does attest to episodes of abdominal pain. They occur both with and without eating. She does note episodes of diarrhea as well.  She denies amaurosis fugax, TIA there is no history of CVA. She denies past history of DVT, PE or superficial thrombophlebitis. She denies claudication-like symptoms, no rest pain, no past history of open wounds or sores of the feet. No past history of vascular interventions  Current Facility-Administered Medications  Medication Dose Route Frequency Provider Last Rate Last Dose  . acetaminophen (TYLENOL) tablet 650 mg  650 mg Oral Q6H PRN Wyatt Haste,  MD       Or  . acetaminophen (TYLENOL) suppository 650 mg  650 mg Rectal Q6H PRN Wyatt Haste, MD      . diltiazem (CARDIZEM CD) 24 hr capsule 180 mg  180 mg Oral BID Katharina Caper, MD   180 mg at 02/08/16 0954  . feeding supplement (GLUCERNA SHAKE) (GLUCERNA SHAKE) liquid 237 mL  237 mL Oral TID BM Katharina Caper, MD   237 mL at 02/08/16 2301  . heparin ADULT infusion 100 units/mL (25000 units/236mL sodium chloride 0.45%)  1,150 Units/hr Intravenous Continuous Katharina Caper, MD 11.5 mL/hr at 02/08/16 2034 1,150 Units/hr at 02/08/16 2034  . insulin aspart (novoLOG) injection 0-15 Units  0-15 Units Subcutaneous TID WC Ihor Austin, MD   15 Units at 02/08/16 1722  . insulin aspart (novoLOG) injection 0-5 Units  0-5 Units Subcutaneous QHS Ihor Austin, MD   3 Units at 02/08/16 2253  . ipratropium-albuterol (DUONEB) 0.5-2.5 (3) MG/3ML nebulizer solution 3 mL  3 mL Nebulization Q4H PRN Wyatt Haste, MD      . metoprolol (LOPRESSOR) tablet 50 mg  50 mg Oral Q6H Katharina Caper, MD   50 mg at 02/09/16 0223  . nitroGLYCERIN (NITROSTAT) SL tablet 0.4 mg  0.4 mg Sublingual Q5 min PRN Wyatt Haste, MD      . ondansetron [facility]) tablet 4 mg  4 mg Oral Q6H PRN Wyatt Haste, MD       Or  . ondansetron [facility]) injection 4 mg  4 mg Intravenous Q6H PRN Wyatt Haste, MD   4 mg at 02/08/16  1122  . pantoprazole (PROTONIX) EC tablet 40 mg  40 mg Oral Daily Wyatt Hasteavid K Hower, MD   40 mg at 02/08/16 0954  . sodium chloride flush (NS) 0.9 % injection 3 mL  3 mL Intravenous Q12H Wyatt Hasteavid K Hower, MD   3 mL at 02/08/16 2254  . traMADol (ULTRAM) tablet 50 mg  50 mg Oral Q6H PRN Katharina Caperima Vaickute, MD        Past Medical History:  Diagnosis Date  . Afib (HCC)   . Anxiety   . Asthma   . CHF (congestive heart failure) (HCC)   . Coronary artery disease   . Diabetes mellitus without complication (HCC)   . Hyperlipidemia   . Hypertension     Past Surgical History:  Procedure Laterality Date  . CORONARY ARTERY BYPASS GRAFT     . NO PAST SURGERIES      Social History Social History  Substance Use Topics  . Smoking status: Never Smoker  . Smokeless tobacco: Never Used  . Alcohol use No    Family History Family History  Problem Relation Age of Onset  . Hypertension Mother   . Heart failure Mother   . Heart disease Father   No family history of bleeding clotting disorders porphyria or autoimmune disease.  No Known Allergies   REVIEW OF SYSTEMS (Negative unless checked)  Constitutional: [] Weight loss  [] Fever  [] Chills Cardiac: [] Chest pain   [] Chest pressure   [] Palpitations   [] Shortness of breath when laying flat   [] Shortness of breath at rest   [] Shortness of breath with exertion. Vascular:  [] Pain in legs with walking   [] Pain in legs at rest   [] Pain in legs when laying flat   [] Claudication   [] Pain in feet when walking  [] Pain in feet at rest  [] Pain in feet when laying flat   [] History of DVT   [] Phlebitis   [] Swelling in legs   [] Varicose veins   [] Non-healing ulcers Pulmonary:   [] Uses home oxygen   [] Productive cough   [] Hemoptysis   [] Wheeze  [] COPD   [] Asthma Neurologic:  [] Dizziness  [] Blackouts   [] Seizures   [] History of stroke   [] History of TIA  [] Aphasia   [] Temporary blindness   [] Dysphagia   [] Weakness or numbness in arms   [] Weakness or numbness in legs Musculoskeletal:  [] Arthritis   [] Joint swelling   [] Joint pain   [] Low back pain Hematologic:  [] Easy bruising  [] Easy bleeding   [] Hypercoagulable state   [] Anemic  [] Hepatitis Gastrointestinal:  [] Blood in stool   [] Vomiting blood  [] Gastroesophageal reflux/heartburn   [] Difficulty swallowing. Genitourinary:  [] Chronic kidney disease   [] Difficult urination  [] Frequent urination  [] Burning with urination   [] Blood in urine Skin:  [] Rashes   [] Ulcers   [] Wounds Psychological:  [] History of anxiety   []  History of major depression.  Physical Examination  Vitals:   02/08/16 1152 02/08/16 1156 02/08/16 1930 02/09/16 0357  BP:  (!) 154/64  (!) 163/58 (!) 158/56  Pulse: 71  72 83  Resp: 17  16 16   Temp:  97.8 F (36.6 C) 98.4 F (36.9 C) 98 F (36.7 C)  TempSrc:  Oral Oral Oral  SpO2: 99%  92% 97%  Weight:      Height:       Body mass index is 27.15 kg/m. Gen:  WD/WN, NAD Head: Loco/AT, No temporalis wasting. Prominent temp pulse not noted. Ear/Nose/Throat: Hearing grossly intact, nares w/o erythema or drainage, oropharynx w/o Erythema/Exudate  Eyes: Sclera non-icteric, conjunctiva clear Neck: Trachea midline.  No JVD.  Pulmonary:  Good air movement, respirations not labored, equal bilaterally.  Cardiac: RRR, normal S1, S2. Vascular:  Vessel Right Left  Radial Palpable Palpable  Ulnar Palpable Palpable  Brachial Palpable Palpable  Carotid Palpable, without bruit Palpable, without bruit  Aorta Not palpable N/A  Femoral Palpable Palpable  Popliteal Palpable Palpable  PT Not Palpable Not Palpable  DP Not Palpable Not Palpable   Gastrointestinal: soft, non-tender/non-distended. No guarding/reflex.  Musculoskeletal: M/S 5/5 throughout.  Extremities without ischemic changes.  No deformity or atrophy. No edema. Neurologic: Sensation grossly intact in extremities.  Symmetrical.  Speech is fluent. Motor exam as listed above. Psychiatric: Judgment intact, Mood & affect appropriate for pt's clinical situation, Affect is quite blunted and she seems somewhat disengaged during my interview. Dermatologic: No rashes or ulcers noted.  No cellulitis or open wounds. Lymph : No Cervical, Axillary, or Inguinal lymphadenopathy.  CBC Lab Results  Component Value Date   WBC 32.0 (H) 02/09/2016   HGB 11.5 (L) 02/09/2016   HCT 34.4 (L) 02/09/2016   MCV 82.9 02/09/2016   PLT 221 02/09/2016    BMET    Component Value Date/Time   NA 138 02/07/2016 0556   NA 141 02/10/2014 1945   K 4.0 02/07/2016 0556   K 3.6 02/10/2014 1945   CL 102 02/07/2016 0556   CL 107 02/10/2014 1945   CO2 22 02/07/2016 0556   CO2 22  02/10/2014 1945   GLUCOSE 404 (H) 02/07/2016 0556   GLUCOSE 246 (H) 02/10/2014 1945   BUN 22 (H) 02/07/2016 0556   BUN 21 (H) 02/10/2014 1945   CREATININE 0.98 02/08/2016 0610   CREATININE 0.69 02/10/2014 1945   CALCIUM 8.7 (L) 02/07/2016 0556   CALCIUM 8.3 (L) 02/10/2014 1945   GFRNONAA 55 (L) 02/08/2016 0610   GFRNONAA >60 02/10/2014 1945   GFRNONAA >60 11/02/2013 1900   GFRAA >60 02/08/2016 0610   GFRAA >60 02/10/2014 1945   GFRAA >60 11/02/2013 1900   Estimated Creatinine Clearance: 48.2 mL/min (by C-G formula based on SCr of 0.98 mg/dL).  COAG Lab Results  Component Value Date   INR 0.99 02/06/2016   INR 0.92 01/24/2016   INR 1.00 11/04/2015    Radiology Dg Chest 2 View  Result Date: 02/06/2016 CLINICAL DATA:  Chest pain starting 12 p.m. today EXAM: CHEST  2 VIEW COMPARISON:  01/24/2016 FINDINGS: Cardiomegaly is noted. No infiltrate or pleural effusion. No pulmonary edema. Osteopenia and mild degenerative change thoracic spine. Status post median sternotomy. IMPRESSION: No active cardiopulmonary disease. Cardiomegaly again noted. Status post median sternotomy. Electronically Signed   By: Natasha Mead M.D.   On: 02/06/2016 16:31   Ct Abdomen Pelvis W Contrast  Result Date: 02/06/2016 CLINICAL DATA:  Nausea, vomiting and diarrhea for 1 hour. Hypertensive. History of hypertension, diabetes. EXAM: CT ABDOMEN AND PELVIS WITH CONTRAST TECHNIQUE: Multidetector CT imaging of the abdomen and pelvis was performed using the standard protocol following bolus administration of intravenous contrast. CONTRAST:  ISOVUE-300 IOPAMIDOL (ISOVUE-300) INJECTION 61% COMPARISON:  Acute abdominal series February 06, 2016 at 1614 hours FINDINGS: LOWER CHEST: Small bilateral pleural effusions. Interlobular septal thickening most consistent with chronic CHF. The heart is at least mildly enlarged. No pericardial effusions. HEPATOBILIARY: The liver is diffusely hypodense compatible with steatosis with  mild focal fatty sparing about the gallbladder fossa. Gallbladder is normal. PANCREAS: Normal. SPLEEN: Normal. ADRENALS/URINARY TRACT: Kidneys are orthotopic demonstrate normal enhancement. Hyper perfused RIGHT  kidney with minimal enhancement on early phase, slightly increased on delayed phase. Thromboembolism RIGHT renal artery from the origin distally. RIGHT interpolar renal scarring. Too small to characterize hypodensity lower pole LEFT kidney. 3 mm nonobstructing RIGHT lower pole and 2 mm RIGHT upper pole nephrolithiasis. Urinary bladder is well distended and unremarkable. STOMACH/BOWEL: The stomach, small and large bowel are normal in course and caliber without inflammatory changes. Moderate sigmoid diverticulosis. Normal appendix. VASCULAR/LYMPHATIC: Aortoiliac vessels are normal in course and caliber, severe atherosclerosis. 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 No lymphadenopathy by CT size criteria. REPRODUCTIVE: Normal. OTHER: Small amount of low-density free fluid in the pelvis. No focal fluid collection, no intraperitoneal free air. MUSCULOSKELETAL: Nonacute. Moderate degenerative change of LEFT hip. 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 Anterior abdominal wall scarring with small fat containing umbilical hernia. 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 Severe atherosclerosis. 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 Colonic diverticulosis without acute diverticulitis. Small bilateral pleural effusions. Acute findings discussed with and reconfirmed by Dr.JAMES MCSHANE on 02/06/2016 at 6:55 pm. Electronically Signed   By: Awilda Metro M.D.   On: 02/06/2016 18:59   US Venous Img Lower Unilateral Left  Result Date: 02/07/2016 CLINICAL DATA:  Left lower extremity swelling EXAM: LEFT LOWER EXTREMITY VENOUS DUPLEX ULTRASOUND TECHNIQUE:  Doppler venous assessment of the left lower extremity deep venous system was performed, including characterization of spectral flow, compressibility, and phasicity. COMPARISON:  None. FINDINGS: There is complete compressibility of the left common femoral, femoral, and popliteal veins. Doppler analysis demonstrates respiratory phasicity and augmentation of flow with calf compression. No obvious superficial vein or calf vein thrombosis. IMPRESSION: No evidence of left lower extremity DVT. Electronically Signed   By: Jolaine Click M.D.   On: 02/07/2016 17:29   Dg Chest Port 1 View  Result Date: 01/24/2016 CLINICAL DATA:  Chest pain EXAM: PORTABLE CHEST 1 VIEW COMPARISON:  11/25/2015 chest radiograph. FINDINGS: Sternotomy wires appear aligned and intact. CABG clips overlie the mediastinum. Coronary stent overlies the left heart. Stable cardiomediastinal silhouette with top-normal heart size. No pneumothorax. No pleural effusion. Lungs appear clear, with no acute consolidative airspace disease and no pulmonary edema. IMPRESSION: No active disease. Electronically Signed   By: Delbert Phenix M.D.   On: 01/24/2016 11:49   Dg Abd 2 Views  Result Date: 02/06/2016 CLINICAL DATA:  Initial evaluation for acute abdominal pain, fever. EXAM: ABDOMEN - 2 VIEW COMPARISON:  Prior radiograph from 05/07/2014. FINDINGS: Bowel gas pattern within normal limits without evidence for obstruction or ileus. No abnormal bowel wall thickening. No free air. No soft tissue mass or abnormal calcification. Median sternotomy wires noted. Scattered degenerative changes noted within the visualized spine. Degenerative changes noted about the hips bilaterally as well. IMPRESSION: Nonobstructive bowel gas pattern with no radiographic evidence for acute intra-abdominal or pelvic process. Electronically Signed   By: Rise Mu M.D.   On: 02/06/2016 16:33      Assessment/Plan 1. Chronic mesenteric ischemia:  The patient gives a history  consistent with postprandial pain as well as episodes of diarrhea. Findings on the CT scan support stenosis possibly high-grade of her mesenteric arteries. Given these findings I believe further evaluation is warranted. I do not believe that the abdominal pain she was admitted with is secondary to mesenteric ischemia more likely secondary to her renal infarction. In the near term her atrial fibrillation must be controlled and her anticoagulation must be worked  out. Subsequently angiography with the potential for intervention must be considered. I did discuss this with the patient and reviewed the risks and benefits and the indications with the patient she is not ready to make a decision as to whether to proceed or not at this time. I will continue to follow and can set her angiography up as an outpatient if needed. Furthermore given her loss of renal mass delaying any further contrast exposure until we are more certain of the extent of her renal insult would be prudent 2. 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 I concur that her renal infarct is most likely secondary to embolization secondary to her atrial fibrillation. Clearly anticoagulation in her circumstances is necessary. Monitoring her anticoagulation will be difficult in noncompliant patients Coumadin can be evaluated very easily by checking an INR however compliance with varying Coumadin dose is very problematic. On the other hand the new anticoagulates are much easier to take and therefore compliance is high or at your unable to monitor as to whether she is actually anticoagulated. 3. Acute renal insufficiency:  Present on admission appears to be resolved but clearly she is loss half of her renal mass and exposure to nephrotoxic medications must be limited as much as possible.  4 left extremity swelling:  Doppler ultrasound is negative dvt, mild  graduated compression stockings will be strongly encouraged as an outpatient 5. Leukocytosis, likely reactive, follow closely 6. Metabolic encephalopathy:   This appears to be somewhat improved over her admission nevertheless she does remain somewhat vague with a blunted affect. Psychiatry is following has deemed that she is competent to make her own medical decisions.  Levora Dredge, MD  02/09/2016 8:03 AM    This note was created with Dragon medical transcription system.  Any error is purely unintentional

## 2016-02-09 NOTE — Progress Notes (Signed)
ANTICOAGULATION CONSULT NOTE - Initial Consult  Pharmacy Consult for heparin drip Indication: renal infarct  No Known Allergies  Patient Measurements: Height: 5\' 4"  (162.6 cm) Weight: 158 lb 3.2 oz (71.8 kg) IBW/kg (Calculated) : 54.7 Heparin Dosing Weight: 69 kg  Vital Signs: Temp: 98.4 F (36.9 C) (11/29 1930) Temp Source: Oral (11/29 1930) BP: 163/58 (11/29 1930) Pulse Rate: 72 (11/29 1930)  Labs:  Recent Labs  02/06/16 1516  02/07/16 0556  02/08/16 0610 02/08/16 1414 02/09/16 0024  HGB 12.4  --  12.8  --  11.8*  --  11.5*  HCT 37.1  --  39.2  --  36.2  --  34.4*  PLT 223  --  257  --  243  --  221  APTT  --   --  63*  --   --   --   --   LABPROT 13.1  --   --   --   --   --   --   INR 0.99  --   --   --   --   --   --   HEPARINUNFRC  --   < > 0.45  < > 0.37 0.31 0.37  CREATININE 0.52  --  1.01*  --  0.98  --   --   TROPONINI <0.03  --   --   --   --   --   --   < > = values in this interval not displayed.  Estimated Creatinine Clearance: 48.2 mL/min (by C-G formula based on SCr of 0.98 mg/dL).   Medical History: Past Medical History:  Diagnosis Date  . Afib (HCC)   . Anxiety   . Asthma   . CHF (congestive heart failure) (HCC)   . Coronary artery disease   . Diabetes mellitus without complication (HCC)   . Hyperlipidemia   . Hypertension     Medications:  Prescriptions Prior to Admission  Medication Sig Dispense Refill Last Dose  . diltiazem (CARDIZEM CD) 180 MG 24 hr capsule Take 1 capsule (180 mg total) by mouth daily. 30 capsule 0 02/06/2016 at 0800  . lisinopril (PRINIVIL,ZESTRIL) 20 MG tablet Take 1 tablet (20 mg total) by mouth daily. 30 tablet 0 02/06/2016 at 0800  . metoprolol tartrate (LOPRESSOR) 25 MG tablet Take 1 tablet (25 mg total) by mouth 2 (two) times daily. 30 tablet 0 02/06/2016 at 0800  . pantoprazole (PROTONIX) 40 MG tablet Take 1 tablet (40 mg total) by mouth daily. (Patient taking differently: Take 40 mg by mouth daily as needed.  For acid reflux) 30 tablet 2 02/06/2016 at 0800  . albuterol (ACCUNEB) 1.25 MG/3ML nebulizer solution Take 1.25 mg by nebulization every 4 (four) hours as needed for wheezing or shortness of breath.    prn at prn  . meclizine (ANTIVERT) 25 MG tablet Take 1 tablet (25 mg total) by mouth 3 (three) times daily as needed for dizziness. 30 tablet 0 prn at prn  . nitroGLYCERIN (NITROSTAT) 0.4 MG SL tablet Place 1 tablet (0.4 mg total) under the tongue every 5 (five) minutes as needed for chest pain. 30 tablet 0 prn at prn  . ondansetron (ZOFRAN ODT) 4 MG disintegrating tablet Take 1 tablet (4 mg total) by mouth every 8 (eight) hours as needed for nausea or vomiting. 20 tablet 0 prn at prn  . rivaroxaban (XARELTO) 20 MG TABS tablet Take 1 tablet (20 mg total) by mouth daily. (Patient not taking: Reported on 02/07/2016) 30 tablet  0 Not Taking   Scheduled:  . diltiazem  180 mg Oral BID  . feeding supplement (GLUCERNA SHAKE)  237 mL Oral TID BM  . insulin aspart  0-15 Units Subcutaneous TID WC  . insulin aspart  0-5 Units Subcutaneous QHS  . metoprolol tartrate  50 mg Oral Q6H  . pantoprazole  40 mg Oral Daily  . sodium chloride flush  3 mL Intravenous Q12H   Infusions:  . heparin 1,150 Units/hr (02/08/16 2034)    Assessment: Pharmacy consulted to dose and monitor heparin drip in this 75 year old female diagnosed with an occluded right renal artery with right kidney ischemia/infarct.  Patient was prescribed rivaroxaban prior to admission - unclear if she has been taking it. Patient has a history of noncompliance. She told the MD that she was not taking rivaroxaban, but reported to pharmacy that she took a dose this morning.   Based on heparin level < 0.10 appears that she was not taking   Goal of Therapy:  Heparin level 0.3-0.7 units/ml Monitor platelets by anticoagulation protocol: Yes   Plan:  Current orders for heparin 1,100 units/hr. Patient has been at same rate, however Heparin level has  been trending down now 0.31. Therapeutic, however would prefer to be at the higher end of therapeutic range due to active thrombus. Will increase to heparin 1,150 units/hr and recheck heparin level in 8 hours. CBC with AM labs.   11/30 00:30 heparin level 0.37. Recheck in 8 hours to confirm.  Harris Penton S, PharmD, BCPS Clinical Pharmacist 02/09/2016,1:18 AM

## 2016-02-09 NOTE — Progress Notes (Signed)
[facility] - [place] [facility]   PATIENT NAME: Robin [name]    MR#:  [id number]  DATE OF BIRTH:  October 27, 1940  SUBJECTIVE:  CHIEF COMPLAINT:   Chief Complaint  Patient presents with  . Nausea  . Diarrhea   Patient is a 75 year old Caucasian female with past medical history significant for history of atrial fibrillation, anxiety, asthma, CHF, coronary artery disease, diabetes, hypertension, hyperlipidemia, who is supposed to be on anticoagulation with Xarelto, but not sure if she takes it, who presents to the hospital with complaints of right-sided abdominal pain, flank pain for the past few days, nausea, vomiting and diarrhea. On arrival to the hospital. 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 The patient was seen by psychiatrist, who felt that she is competent to make decisions about medical care, who recommended to discuss medication side effects as well as indications. Patient will be going back home with home health services, nursing staff, who will be able to educate patient about medications Patient does not want to decide about angiography with vascular procedures at this time and she would like to follow up in the office for this. She understands the importance of 20 coagulation and agreed to take it regularly after discharge.  Review of Systems  Unable to perform ROS: Medical condition  Gastrointestinal: Positive for abdominal pain and nausea.    VITAL SIGNS: Blood pressure (!) 160/57, pulse 81, temperature 98.7 F (37.1 C), temperature source Oral, resp. rate 16, height 5\' 4"  (1.626 m), weight 71.8 kg (158 lb 3.2 oz), SpO2 100 %.  PHYSICAL EXAMINATION:   GENERAL:  75 y.o.-year-old patient lying in the be in no distress,  EYES: Pupils equal, round, reactive to light  and accommodation. No scleral icterus. Extraocular muscles intact.  HEENT: Head atraumatic, normocephalic. Oropharynx and nasopharynx clear.  NECK:  Supple, no jugular venous distention. No thyroid enlargement, no tenderness.  LUNGS: Normal breath sounds bilaterally, no wheezing, few scattered rales,rhonchi and crepitations noted diffusely. No use of accessory muscles of respiration.  CARDIOVASCULAR: S1, S2 normal. No murmurs, rubs, or gallops.  ABDOMEN: Soft, tender in right side, no rebound or guarding was noted,mild  tenderness on right flank percussion, nondistended. Bowel sounds present. No organomegaly or mass.  EXTREMITIES: 1+ lower extremity and pedal edema on the left, less so on the right, no cyanosis, or clubbing.  NEUROLOGIC: Cranial nerves II through XII are intact. Muscle strength 5/5 in all extremities. Sensation intact. Gait not checked.  PSYCHIATRIC: The patient is alert and oriented x 3.  SKIN: No obvious rash, lesion, or ulcer.   ORDERS/RESULTS REVIEWED:   CBC  Recent Labs Lab 02/06/16 1516 02/07/16 0556 02/08/16 0610 02/09/16 0024  WBC 19.3* 17.6* 24.6* 32.0*  HGB 12.4 12.8 11.8* 11.5*  HCT 37.1 39.2 36.2 34.4*  PLT 223 257 243 221  MCV 83.8 86.0 84.7 82.9  MCH 28.1 28.2 27.7 27.7  MCHC 33.5 32.8 32.7 33.4  RDW 14.6* 14.3 14.5 14.2  LYMPHSABS 1.5  --   --   --   MONOABS 0.8  --   --   --   EOSABS 0.1  --   --   --   BASOSABS 0.1  --   --   --    ------------------------------------------------------------------------------------------------------------------  Chemistries   Recent Labs Lab 02/06/16 1516 02/07/16 0556 02/08/16 4098  NA 140 138  --   K 3.6 4.0  --   CL 109 102  --   CO2 23 22  --   GLUCOSE 236* 404*  --   BUN 20 22*  --   CREATININE 0.52 1.01* 0.98  CALCIUM 8.2* 8.7*  --   AST 26  --   --   ALT 30  --   --   ALKPHOS 130*  --   --   BILITOT 0.2*  --   --     ------------------------------------------------------------------------------------------------------------------ estimated creatinine clearance is 48.2 mL/min (by C-G formula based on SCr of 0.98 mg/dL). ------------------------------------------------------------------------------------------------------------------ No results for input(s): TSH, T4TOTAL, T3FREE, THYROIDAB in the last 72 hours.  Invalid input(s): FREET3  Cardiac Enzymes  Recent Labs Lab 02/06/16 1516  TROPONINI <0.03   ------------------------------------------------------------------------------------------------------------------ Invalid input(s): POCBNP ---------------------------------------------------------------------------------------------------------------  RADIOLOGY: No results found.  EKG:  Orders placed or performed during the hospital encounter of 02/06/16  . EKG 12-Lead  . EKG 12-Lead    ASSESSMENT AND PLAN:  Principal Problem:   Noncompliance Active Problems:   Renal infarct [facility])   Atrial fibrillation with rapid ventricular response (HCC)  #1.  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 Right-sided abdominal pain, likely due to acute renal infarct, continue pain medications, . Change oxycodone to tramadol, follow clinically . Vascular surgeon feels that SMA occlusion of the size may not result in symptomatology,   Suggested angiogram, but pt will like to do it later. #3. Acute renal insufficiency, resolved on IV fluids  #4 left  lower extremity swelling, Doppler ultrasound showed no  dvt #5. Leukocytosis, questionable reactive, follow closely #6. Metabolic  encephalopathy, improved with therapy, change oxycodone to tramadol, follow closely, psychiatry saw patient in consultation and felt that patient is competent to make decisions for medical care, patient will need to have a home health services, RN to discuss patient's medications and indications #7. Nausea, likely related to oxycodone, continue supportive therapy, initiate IV fluids if needed, change oxycodone to tramadol, follow closely   Management plans discussed with the patient, family and they are in agreement.   DRUG ALLERGIES: No Known Allergies  CODE STATUS:     Code Status Orders        Start     Ordered   02/06/16 1917  Full code  Continuous     02/06/16 1917    Code Status History    Date Active Date Inactive Code Status Order ID Comments User Context   01/24/2016  2:00 PM 01/25/2016  7:15 PM Full Code [id number]  Auburn Bilberry, MD Inpatient   11/25/2015  3:40 PM 11/26/2015  3:49 PM Full Code [id number]  Houston Siren, MD Inpatient   11/05/2015 12:33 AM 11/06/2015  6:05 PM Full Code [id number]  Oralia Manis, MD Inpatient   09/13/2015 10:10 AM 09/15/2015  7:33 PM Full Code [id number]  Altamese Dilling, MD Inpatient      TOTAL TIME TAKING CARE OF THIS PATIENT: 35 minutes.    Altamese Dilling M.D on 02/09/2016 at 6:30 PM  Between 7am to 6pm - Pager - [phone]  After 6pm go to [email or web address] - Scientist, research (life sciences) Hospitalists  Office  [phone]  CC: Primary care physician; Leanna SatoMILES,LINDA M, MD

## 2016-02-09 NOTE — [place] FL2 (Signed)
[place] MEDICAID FL2 LEVEL OF CARE SCREENING TOOL     IDENTIFICATION  Patient Name: Robin [name] Birthdate: 06/04/1940 Sex: female Admission Date (Current Location): 02/06/2016  [place]  [place] [id number] Q Facility and Address:  [facility], [address], [place], [place] [address]      Provider Number: [id number]  Attending Physician Name and Address:  Altamese DillingVaibhavkumar Jaray Boliver, MD  Relative Name and Phone Number:  [name],[name] Daughter [phone] or Arville LimeDixon,James Son [phone]     Current Level of Care: Hospital Recommended Level of Care: Skilled Nursing Facility Prior Approval Number:    Date Approved/Denied:   PASRR Number: [id number]  Discharge Plan: SNF    Current Diagnoses: Patient Active Problem List   Diagnosis Date Noted  . Noncompliance 02/07/2016  . Renal infarct (HCC) 02/06/2016  . Atrial fibrillation with rapid ventricular response (HCC) 02/06/2016  . A-fib (HCC) 01/24/2016  . HTN (hypertension) 11/04/2015  . HLD (hyperlipidemia) 11/04/2015  . CAD (coronary artery disease) 11/04/2015  . Chronic systolic CHF (congestive heart failure) (HCC) 11/04/2015  . Diabetes (HCC) 11/04/2015    Orientation RESPIRATION BLADDER Height & Weight     Self, Time, Situation, Place  Normal Incontinent Weight: 158 lb 3.2 oz (71.8 kg) Height:  5\' 4"  (162.6 cm)  BEHAVIORAL SYMPTOMS/MOOD NEUROLOGICAL BOWEL NUTRITION STATUS      Continent Diet (Cardiac Diet)  AMBULATORY STATUS COMMUNICATION OF NEEDS Skin   Limited Assist Verbally Normal                       Personal Care Assistance Level of Assistance  Bathing, Feeding, Dressing Bathing Assistance: Limited assistance Feeding assistance: Independent Dressing Assistance: Limited assistance     Functional Limitations Info  Sight, Hearing, Speech Sight Info: Adequate Hearing Info: Adequate Speech Info: Adequate    SPECIAL CARE FACTORS FREQUENCY  PT (By  licensed PT)     PT Frequency: 5x a week              Contractures Contractures Info: Not present    Additional Factors Info  Insulin Sliding Scale Code Status Info: Full Code Allergies Info: NKA   Insulin Sliding Scale Info: insulin aspart (novoLOG) injection 0-15 Units 3 x a day with meals       Current Medications (02/09/2016):  This is the current hospital active medication list Current Facility-Administered Medications  Medication Dose Route Frequency Provider Last Rate Last Dose  . acetaminophen (TYLENOL) tablet 650 mg  650 mg Oral Q6H PRN Wyatt Hasteavid K Hower, MD       Or  . acetaminophen (TYLENOL) suppository 650 mg  650 mg Rectal Q6H PRN Wyatt Hasteavid K Hower, MD      . diltiazem (CARDIZEM CD) 24 hr capsule 180 mg  180 mg Oral BID Katharina Caperima Vaickute, MD   180 mg at 02/09/16 0940  . feeding supplement (GLUCERNA SHAKE) (GLUCERNA SHAKE) liquid 237 mL  237 mL Oral TID BM Katharina Caperima Vaickute, MD   237 mL at 02/09/16 1000  . heparin ADULT infusion 100 units/mL (25000 units/25650mL sodium chloride 0.45%)  1,300 Units/hr Intravenous Continuous Altamese DillingVaibhavkumar Ziah Leandro, MD 13 mL/hr at 02/09/16 0937 1,300 Units/hr at 02/09/16 0937  . insulin aspart (novoLOG) injection 0-15 Units  0-15 Units Subcutaneous TID WC Ihor AustinPavan Pyreddy, MD   5 Units at 02/09/16 1654  . insulin aspart (novoLOG) injection 0-5 Units  0-5 Units Subcutaneous QHS Ihor AustinPavan Pyreddy, MD   3 Units at 02/08/16 2253  . ipratropium-albuterol (DUONEB) 0.5-2.5 (3) MG/3ML  nebulizer solution 3 mL  3 mL Nebulization Q4H PRN Wyatt Haste, MD      . metoprolol (LOPRESSOR) tablet 50 mg  50 mg Oral Q6H Katharina Caper, MD   50 mg at 02/09/16 1452  . nitroGLYCERIN (NITROSTAT) SL tablet 0.4 mg  0.4 mg Sublingual Q5 min PRN Wyatt Haste, MD      . ondansetron [facility]) tablet 4 mg  4 mg Oral Q6H PRN Wyatt Haste, MD       Or  . ondansetron [facility]) injection 4 mg  4 mg Intravenous Q6H PRN Wyatt Haste, MD   4 mg at 02/08/16 1122  . pantoprazole (PROTONIX) EC tablet  40 mg  40 mg Oral Daily Wyatt Haste, MD   40 mg at 02/09/16 0940  . sodium chloride flush (NS) 0.9 % injection 3 mL  3 mL Intravenous Q12H Wyatt Haste, MD   3 mL at 02/09/16 1000  . traMADol (ULTRAM) tablet 50 mg  50 mg Oral Q6H PRN Katharina Caper, MD         Discharge Medications: Please see discharge summary for a list of discharge medications.  Relevant Imaging Results:  Relevant Lab Results:   Additional Information SSN [id number]  [name]

## 2016-02-09 NOTE — Progress Notes (Signed)
Robin [name] and Vascular Surgery  Daily Progress Note   Subjective  - * No surgery found *  Patient states she has tolerated her diet today and denies abdominal pain  Objective Vitals:   02/09/16 1218 02/09/16 1351 02/09/16 1623 02/09/16 1625  BP: (!) 144/56 (!) 165/62 (!) 164/58 (!) 160/57  Pulse: 69 74 67 81  Resp:      Temp:      TempSrc:      SpO2:   100% 100%  Weight:      Height:        Intake/Output Summary (Last 24 hours) at 02/09/16 1735 Last data filed at 02/09/16 1617  Gross per 24 hour  Intake           328.11 ml  Output              850 ml  Net          -521.89 ml    PULM  Normal effort , no use of accessory muscles CV  No JVD, RRR Abd      No distended, nontender VASC  Pedal pulses are not palpable  Laboratory CBC    Component Value Date/Time   WBC 32.0 (H) 02/09/2016 0024   HGB 11.5 (L) 02/09/2016 0024   HGB 12.5 02/10/2014 1945   HCT 34.4 (L) 02/09/2016 0024   HCT 40.0 02/10/2014 1945   PLT 221 02/09/2016 0024   PLT 296 02/10/2014 1945    BMET    Component Value Date/Time   NA 138 02/07/2016 0556   NA 141 02/10/2014 1945   K 4.0 02/07/2016 0556   K 3.6 02/10/2014 1945   CL 102 02/07/2016 0556   CL 107 02/10/2014 1945   CO2 22 02/07/2016 0556   CO2 22 02/10/2014 1945   GLUCOSE 404 (H) 02/07/2016 0556   GLUCOSE 246 (H) 02/10/2014 1945   BUN 22 (H) 02/07/2016 0556   BUN 21 (H) 02/10/2014 1945   CREATININE 0.98 02/08/2016 0610   CREATININE 0.69 02/10/2014 1945   CALCIUM 8.7 (L) 02/07/2016 0556   CALCIUM 8.3 (L) 02/10/2014 1945   GFRNONAA 55 (L) 02/08/2016 0610   GFRNONAA >60 02/10/2014 1945   GFRNONAA >60 11/02/2013 1900   GFRAA >60 02/08/2016 0610   GFRAA >60 02/10/2014 1945   GFRAA >60 11/02/2013 1900    Assessment/Planning:  I have discussed her mesenteric artery stenosis with her again. I asked if she would like to have her angiography tomorrow however she wishes to think about this and is not willing to move forward at  this time.  I stressed the importance of anticoagulation given her atrial fibrillation and renal infarct. And concur with the heparin drip at this time.  At this point if she is not willing to move forward with her angiography I can follow up with her as an outpatient at which point I will again discuss the indications for angiography and possible intervention.    Levora Dredge  02/09/2016, 5:35 PM

## 2016-02-09 NOTE — Clinical Social Work Note (Signed)
Clinical Social Work Assessment  Patient Details  Name: Robin [name] MRN: [id number] Date of Birth: [birth year]  Date of referral:  02/09/16               Reason for consult:  Facility Placement                Permission sought to share information with:  Facility Medical sales representative, Family Supports Permission granted to share information::  Yes, Verbal Permission Granted  Name::     [name],[name] Daughter [phone] or Patient Information   Agency::  SNF admissions  Relationship::     Contact Information:     Housing/Transportation Living arrangements for the past 2 months:  Skilled Nursing Facility Source of Information:  Patient Patient Interpreter Needed:  None Criminal Activity/Legal Involvement Pertinent to Current Situation/Hospitalization:  No - Comment as needed Significant Relationships:  Adult Children, Significant Other Lives with:  Self Do you feel safe going back to the place where you live?  No Need for family participation in patient care:  No (Coment)  Care giving concerns:  Patient feels she needs some short term rehab before she is able to return back home.   Social Worker assessment / plan:  Patient is a 75 year old female who is alert and oriented x4.  Patient states she has not been to rehab before, MSW explained to patient role of MSW and process for SNF placement.  Patient was explained what to expect at SNF and how insurance will pay for her stay.  MSW explained to patient how the facility will assist with discharge planning after she has received therapy.  Patient did not express any other questions or concerns.  Employment status:  Retired Health and safety inspector:  Medicare PT Recommendations:  Skilled Nursing Facility Information / Referral to community resources:  Skilled Nursing Facility  Patient/Family's Response to care: Patient in agreement to going to SNF for short term rehab.  Patient/Family's Understanding of and Emotional Response to  Diagnosis, Current Treatment, and Prognosis:  Patient was not very talkative, patient expressed she hopes she will not have to be at [facility] for very long.  Emotional Assessment Appearance:  Appears stated age Attitude/Demeanor/Rapport:    Affect (typically observed):  Appropriate, Calm, Stable Orientation:  Oriented to Self, Oriented to Place, Oriented to  Time, Oriented to Situation Alcohol / Substance use:  Not Applicable Psych involvement (Current and /or in the community):  No (Comment)  Discharge Needs  Concerns to be addressed:  Lack of Support Readmission within the last 30 days:  No Current discharge risk:  Lack of support system Barriers to Discharge:  No Barriers Identified   [name] 02/09/2016, 5:25 PM

## 2016-02-09 NOTE — Progress Notes (Signed)
Inpatient Diabetes Program Recommendations  AACE/ADA: New Consensus Statement on Inpatient Glycemic Control (2015)  Target Ranges:  Prepandial:   less than 140 mg/dL      Peak postprandial:   less than 180 mg/dL (1-2 hours)      Critically ill patients:  140 - 180 mg/dL  Results for BREAH, DEFUSCO (MRN [id number]) as of 02/09/2016 10:14  Ref. Range 02/08/2016 08:16 02/08/2016 11:51 02/08/2016 17:04 02/08/2016 21:05 02/09/2016 07:34  Glucose-Capillary Latest Ref Range: 65 - 99 mg/dL 376 (H) 283 (H) 151 (H) 258 (H) 223 (H)    Review of Glycemic Control  Current orders for Inpatient glycemic control:Novolog 0-15 units TID with meals, Novolog 0-5 units QHS  Inpatient Diabetes Program Recommendations: Insulin - Basal: Over the past 24 hours, glucose has ranged from 223-361 mg/dland patient has received a total of Novolog 36 units for correction. Please consider ordering low dose basal insulin; recommend ordering Lantus 10 units Q24H starting now.  Thanks, Orlando Penner, RN, MSN, CDE Diabetes Coordinator Inpatient Diabetes Program [phone] (Team Pager from 8am to 5pm)

## 2016-02-09 NOTE — Clinical Social Work Placement (Addendum)
   CLINICAL SOCIAL WORK PLACEMENT  NOTE  Date:  02/09/2016  Patient Details  Name: Robin [name] MRN: [id number] Date of Birth: January 29, 1941  Clinical Social Work is seeking post-discharge placement for this patient at the Skilled  Nursing Facility level of care (*CSW will initial, date and re-position this form in  chart as items are completed):  Yes   Patient/family provided with [facility] Clinical Social Work Department's list of facilities offering this level of care within the geographic area requested by the patient (or if unable, by the patient's family).  Yes   Patient/family informed of their freedom to choose among providers that offer the needed level of care, that participate in Medicare, Medicaid or managed care program needed by the patient, have an available bed and are willing to accept the patient.  Yes   Patient/family informed of [facility]'s ownership interest in [facility] and [facility], as well as of the fact that they are under no obligation to receive care at these facilities.  PASRR submitted to EDS on 02/09/16     PASRR number received on 02/09/16     Existing PASRR number confirmed on       FL2 transmitted to all facilities in geographic area requested by pt/family on 02/09/16     FL2 transmitted to all facilities within larger geographic area on       Patient informed that his/her managed care company has contracts with or will negotiate with certain facilities, including the following:         02-10-16   Patient/family informed of bed offers received Windell Moulding, MSW, 02-10-16 updated).  Patient chooses bed at  [facility] (Windell Moulding, MSW, 02-10-16 updated).     Physician recommends and patient chooses bed at      Patient to be transferred to  [facility]  on  02-10-16 Windell Moulding, MSW, 02-10-16 updated)..  Patient to be transferred to facility by  [facility] Windell Moulding, MSW, 02-10-16  updated).    Patient family notified on  02-10-16 of transfer  Windell Moulding, MSW, 02-10-16 updated)..  Name of family member notified:   Patient notified her own family Windell Moulding, MSW, 02-10-16 updated).     PHYSICIAN Please sign FL2     Additional Comment:    _______________________________________________ [name] 02/09/2016, 5:28 PM

## 2016-02-09 NOTE — Progress Notes (Signed)
Called lab to follow up regarding HL timed for 1730. Per lab, level has not been drawn yet - they will follow up.  Cindi Carbon, PharmD 02/09/16 7:01 PM

## 2016-02-09 NOTE — Progress Notes (Addendum)
ANTICOAGULATION CONSULT NOTE - Initial Consult  Pharmacy Consult for heparin drip Indication: renal infarct  No Known Allergies  Patient Measurements: Height: 5\' 4"  (162.6 cm) Weight: 158 lb 3.2 oz (71.8 kg) IBW/kg (Calculated) : 54.7 Heparin Dosing Weight: 69 kg  Vital Signs: Temp: 98.6 F (37 C) (11/30 1944) Temp Source: Oral (11/30 1944) BP: 160/51 (11/30 1944) Pulse Rate: 86 (11/30 1944)  Labs:  Recent Labs  02/07/16 0556  02/08/16 0610  02/09/16 0024 02/09/16 0804 02/09/16 1842  HGB 12.8  --  11.8*  --  11.5*  --   --   HCT 39.2  --  36.2  --  34.4*  --   --   PLT 257  --  243  --  221  --   --   APTT 63*  --   --   --   --   --   --   HEPARINUNFRC 0.45  < > 0.37  < > 0.37 0.11* 0.31  CREATININE 1.01*  --  0.98  --   --   --   --   < > = values in this interval not displayed.  Estimated Creatinine Clearance: 48.2 mL/min (by C-G formula based on SCr of 0.98 mg/dL).   Medical History: Past Medical History:  Diagnosis Date  . Afib (HCC)   . Anxiety   . Asthma   . CHF (congestive heart failure) (HCC)   . Coronary artery disease   . Diabetes mellitus without complication (HCC)   . Hyperlipidemia   . 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  Patient was prescribed rivaroxaban prior to admission - unclear if she has been taking it. Patient has a history of noncompliance. She told the MD that she was not taking rivaroxaban, but reported to pharmacy that she took a dose this morning.   Based on heparin level < 0.10 appears that she was not taking   Goal of Therapy:  Heparin level 0.3-0.7 units/ml Monitor platelets by anticoagulation protocol: Yes   Plan:  11/30 HL @ 1842 = 0.31 (therapeutic). Continue heparin drip at current infusion rate of 1300 units/hr and order confirmatory HL in 8 hours. CBC ordered  with AM labs tomorrow.  12/1 AM heparin level 0.23. 1000 unit bolus and increase rate to 1450 units/hr. Recheck in 8 hours.   Cindi Carbon, PharmD, BCPS Clinical Pharmacist 02/09/2016,7:45 PM

## 2016-02-09 NOTE — Progress Notes (Signed)
Patient has rested quietly today. Less impulsive and seems slightly less confused compared to yesterday. Still fatigued and wanting to take frequent naps. No complaints of pain this shift. Rounded with Dr. Elisabeth Pigeon, potential discharge in the next day or so - patient on heparin gtt for now, but will likely switch to xarelto at discharge. PT recommending rehab.

## 2016-02-09 NOTE — Progress Notes (Signed)
CBG 266 per Jeri NT. Glucometer hasn't synched yet.

## 2016-02-09 NOTE — Progress Notes (Signed)
ANTICOAGULATION CONSULT NOTE - Initial Consult  Pharmacy Consult for heparin drip Indication: renal infarct  No Known Allergies  Patient Measurements: Height: 5\' 4"  (162.6 cm) Weight: 158 lb 3.2 oz (71.8 kg) IBW/kg (Calculated) : 54.7 Heparin Dosing Weight: 69 kg  Vital Signs: Temp: 98 F (36.7 C) (11/30 0357) Temp Source: Oral (11/30 0357) BP: 154/75 (11/30 0803) Pulse Rate: 104 (11/30 0803)  Labs:  Recent Labs  02/06/16 1516  02/07/16 0556  02/08/16 0610 02/08/16 1414 02/09/16 0024 02/09/16 0804  HGB 12.4  --  12.8  --  11.8*  --  11.5*  --   HCT 37.1  --  39.2  --  36.2  --  34.4*  --   PLT 223  --  257  --  243  --  221  --   APTT  --   --  63*  --   --   --   --   --   LABPROT 13.1  --   --   --   --   --   --   --   INR 0.99  --   --   --   --   --   --   --   HEPARINUNFRC  --   < > 0.45  < > 0.37 0.31 0.37 0.11*  CREATININE 0.52  --  1.01*  --  0.98  --   --   --   TROPONINI <0.03  --   --   --   --   --   --   --   < > = values in this interval not displayed.  Estimated Creatinine Clearance: 48.2 mL/min (by C-G formula based on SCr of 0.98 mg/dL).   Medical History: Past Medical History:  Diagnosis Date  . Afib (HCC)   . Anxiety   . Asthma   . CHF (congestive heart failure) (HCC)   . Coronary artery disease   . Diabetes mellitus without complication (HCC)   . Hyperlipidemia   . 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  Patient was prescribed rivaroxaban prior to admission - unclear if she has been taking it. Patient has a history of noncompliance. She told the MD that she was not taking rivaroxaban, but reported to pharmacy that she took a dose this morning.   Based on heparin level < 0.10 appears that she was not taking   Goal of Therapy:  Heparin level 0.3-0.7 units/ml Monitor platelets by  anticoagulation protocol: Yes   Plan:  Current orders for heparin 1,150 units/hr. Heparin level subtherapeutic at 0.11. Lab confirmed blood draw was peripheral stick and RN confirms that heparin is still currently running at above rate.  Will give heparin bolus of 2000 units x 1, increase rate to 1,300 units/hr. Recheck heparin level in 8 hours.  Martyn Malay, PharmD, BCPS Clinical Pharmacist 02/09/2016,9:16 AM

## 2016-02-10 LAB — CBC
HCT: 33.4 % — ABNORMAL LOW (ref 35.0–47.0)
HEMOGLOBIN: 10.8 g/dL — AB (ref 12.0–16.0)
MCH: 27.1 pg (ref 26.0–34.0)
MCHC: 32.4 g/dL (ref 32.0–36.0)
MCV: 83.7 fL (ref 80.0–100.0)
Platelets: 194 10*3/uL (ref 150–440)
RBC: 3.99 MIL/uL (ref 3.80–5.20)
RDW: 14.3 % (ref 11.5–14.5)
WBC: 23.7 10*3/uL — AB (ref 3.6–11.0)

## 2016-02-10 LAB — BASIC METABOLIC PANEL
Anion gap: 5 (ref 5–15)
BUN: 25 mg/dL — ABNORMAL HIGH (ref 6–20)
CALCIUM: 8.2 mg/dL — AB (ref 8.9–10.3)
CHLORIDE: 103 mmol/L (ref 101–111)
CO2: 29 mmol/L (ref 22–32)
CREATININE: 0.88 mg/dL (ref 0.44–1.00)
GFR calc non Af Amer: >60 mL/min (ref >60)
Glucose, Bld: 210 mg/dL — ABNORMAL HIGH (ref 65–99)
Potassium: 3.8 mmol/L (ref 3.5–5.1)
SODIUM: 137 mmol/L (ref 135–145)

## 2016-02-10 LAB — GLUCOSE, CAPILLARY
GLUCOSE-CAPILLARY: 203 mg/dL — AB (ref 65–99)
Glucose-Capillary: 330 mg/dL — ABNORMAL HIGH (ref 65–99)

## 2016-02-10 LAB — HEPARIN LEVEL (UNFRACTIONATED): HEPARIN UNFRACTIONATED: 0.23 [IU]/mL — AB (ref 0.30–0.70)

## 2016-02-10 MED ORDER — RIVAROXABAN 20 MG PO TABS: 20.0000 mg | ORAL_TABLET | Freq: Every day | ORAL | Status: DC

## 2016-02-10 MED ORDER — HEPARIN BOLUS VIA INFUSION
1000.0000 [IU] | Freq: Once | INTRAVENOUS | Status: AC
  Administered 2016-02-10: 1000 [IU] via INTRAVENOUS
  Filled 2016-02-10: qty 1000

## 2016-02-10 MED ORDER — DILTIAZEM HCL ER COATED BEADS 180 MG PO CP24: 180.0000 mg | ORAL_CAPSULE | Freq: Two times a day (BID) | ORAL | 0 refills | Status: DC

## 2016-02-10 MED ORDER — RIVAROXABAN 15 MG PO TABS: 15.0000 mg | ORAL_TABLET | Freq: Two times a day (BID) | ORAL | 0 refills | Status: DC

## 2016-02-10 MED ORDER — RIVAROXABAN 20 MG PO TABS: 20.0000 mg | ORAL_TABLET | Freq: Every day | ORAL | 0 refills | Status: DC

## 2016-02-10 MED ORDER — METOPROLOL TARTRATE 50 MG PO TABS: 50.0000 mg | ORAL_TABLET | Freq: Three times a day (TID) | ORAL | 0 refills | Status: DC

## 2016-02-10 MED ORDER — RIVAROXABAN 15 MG PO TABS
15.0000 mg | ORAL_TABLET | Freq: Two times a day (BID) | ORAL | Status: DC
  Administered 2016-02-10: 15 mg via ORAL
  Filled 2016-02-10: qty 1

## 2016-02-10 MED ORDER — TRAMADOL HCL 50 MG PO TABS: 50.0000 mg | ORAL_TABLET | Freq: Two times a day (BID) | ORAL | 0 refills | Status: DC | PRN

## 2016-02-10 NOTE — Progress Notes (Addendum)
Inpatient Diabetes Program Recommendations  AACE/ADA: New Consensus Statement on Inpatient Glycemic Control (2015)  Target Ranges:  Prepandial:   less than 140 mg/dL      Peak postprandial:   less than 180 mg/dL (1-2 hours)      Critically ill patients:  140 - 180 mg/dL   Results for HADIL, DEMIRJIAN (MRN [id number]) as of 02/10/2016 13:54  Ref. Range 02/09/2016 07:34 02/09/2016 11:56 02/09/2016 16:25 02/09/2016 20:51  Glucose-Capillary Latest Ref Range: 65 - 99 mg/dL 735 (H) 670 (H) 141 (H) 199 (H)   Results for ALEZAY, HARPOLD (MRN [id number]) as of 02/10/2016 13:54  Ref. Range 02/10/2016 11:09  Glucose-Capillary Latest Ref Range: 65 - 99 mg/dL 887 (H)    Current Insulin Orders: Novolog 0-15 units TID with meals, Novolog 0-5 units QHS     MD- Please consider ordering low dose basal insulin for patient during hospitalization  Recommend ordering Lantus 10 units Q24H starting now (0.15 units/kg dosing)    --Will follow patient during hospitalization--  Ambrose Finland RN, MSN, CDE Diabetes Coordinator Inpatient Glycemic Control Team Team Pager: [phone] (8a-5p)

## 2016-02-10 NOTE — Discharge Summary (Signed)
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)   Atrial fibrillation with rapid ventricular response (HCC)   Mesenteric ischemia  SECONDARY DIAGNOSIS:   Past Medical History:  Diagnosis Date  . Afib (HCC)   . Anxiety   . Asthma   . CHF (congestive heart failure) (HCC)   . Coronary artery disease   . Diabetes mellitus without complication (HCC)   . Hyperlipidemia   . Hypertension     HOSPITAL COURSE:   #1.  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 Right-sided abdominal pain, likely due to acute renal infarct, continue pain medications, . Change oxycodone to tramadol, follow clinically . Vascular surgeon feels that SMA occlusion of the size may not result in symptomatology,   Suggested angiogram, but pt will like to do it later. #3. Acute renal insufficiency, resolved on IV fluids  #4 left  lower extremity swelling, Doppler ultrasound showed no  dvt #5. Leukocytosis, questionable reactive, follow closely #6. Metabolic encephalopathy, improved with therapy, change oxycodone to tramadol, follow closely, psychiatry saw patient in consultation and felt that patient is competent to make  decisions for medical care, patient will need to have a home health services, RN to discuss patient's medications and indications #7. Nausea, likely related to oxycodone, continue supportive therapy, initiate IV fluids if needed, change oxycodone to tramadol, follow closely  If pt gets constipation with pain meds- she may need to have stool softner.  DISCHARGE CONDITIONS:   Stable.  CONSULTS OBTAINED:  Treatment Team:  Wyatt Haste, MD Audery Amel, MD  DRUG ALLERGIES:  No Known Allergies  DISCHARGE MEDICATIONS:   Current Discharge Medication List    START taking these medications   Details  traMADol (ULTRAM) 50 MG tablet Take 1 tablet (50 mg total) by mouth every 12 (twelve) hours as needed for moderate pain or severe pain. Qty: 20 tablet, Refills: 0      CONTINUE these medications which have CHANGED   Details  diltiazem (CARDIZEM CD) 180 MG 24 hr capsule Take 1 capsule (180 mg total) by mouth 2 (two) times daily. Qty: 60 capsule, Refills: 0    metoprolol (LOPRESSOR) 50 MG tablet Take 1 tablet (50 mg total) by mouth 3 (three) times daily. Qty: 90 tablet, Refills: 0    !! Rivaroxaban (XARELTO) 15 MG TABS tablet Take 1 tablet (15 mg total) by mouth 2 (two) times daily with a meal. Qty: 42 tablet, Refills: 0    !! rivaroxaban (XARELTO) 20 MG TABS tablet Take 1 tablet (20 mg total) by mouth daily with supper. Qty: 30 tablet, Refills: 0     !! - Potential duplicate medications found. Please discuss with provider.    CONTINUE these medications which have NOT CHANGED   Details  pantoprazole (PROTONIX) 40 MG tablet Take 1 tablet (40 mg total) by mouth daily. Qty: 30 tablet, Refills: 2    albuterol (ACCUNEB) 1.25 MG/3ML nebulizer solution Take 1.25 mg by nebulization every 4 (four) hours as needed for wheezing or shortness of breath.     meclizine (ANTIVERT) 25 MG tablet Take 1 tablet (25 mg total) by mouth 3 (three) times daily as needed for dizziness. Qty: 30 tablet,  Refills: 0    nitroGLYCERIN (NITROSTAT) 0.4 MG SL tablet Place 1 tablet (0.4 mg total) under the tongue every 5 (five) minutes as needed for chest pain. Qty: 30 tablet, Refills: 0    ondansetron (ZOFRAN ODT) 4 MG disintegrating tablet Take 1 tablet (4 mg total) by mouth every 8 (eight) hours as needed for nausea or vomiting. Qty: 20 tablet, Refills: 0      STOP taking these medications     lisinopril (PRINIVIL,ZESTRIL) 20 MG tablet          DISCHARGE INSTRUCTIONS:    Follow with vascular clinic in 2 weeks.  If you experience worsening of your admission symptoms, develop shortness of breath, life threatening emergency, suicidal or homicidal thoughts you must seek medical attention immediately by calling 911 or calling your MD immediately  if symptoms less severe.  You Must read complete instructions/literature along with all the possible adverse reactions/side effects for all the Medicines you take and that have been prescribed to you. Take any new Medicines after you have completely understood and accept all the possible adverse reactions/side effects.   Please note  You were cared for by a hospitalist during your hospital stay. If you have any questions about your discharge medications or the care you received while you were in the hospital after you are discharged, you can call the unit and asked to speak with the hospitalist on call if the hospitalist that took care of you is not available. Once you are discharged, your primary care physician will handle any further medical issues. Please note that NO REFILLS for any discharge medications will be authorized once you are discharged, as it is imperative that you return to your primary care physician (or establish a relationship with a primary care physician if you do not have one) for your aftercare needs so that they can reassess your need for medications and monitor your lab values.    Today   CHIEF COMPLAINT:   Chief Complaint   Patient presents with  . Nausea  . Diarrhea    HISTORY OF PRESENT ILLNESS:  Robin [name]  is a 75 y.o. female with a known history of Atrial fibrillation who is presenting with "I got sick" She is unable to provide any more information she is remarkably vague with her symptoms Upon further questioning it does seem that she has shortness of breath but uncertain of the duration  She was noted to be tachycardic, hypertensive, found to have renal infarct on imaging  VITAL SIGNS:  Blood pressure (!) 142/75, pulse 75, temperature 97.3 F (36.3 C), temperature source Oral, resp. rate 16, height 5\' 4"  (1.626 m), weight 71.8 kg (158 lb 3.2 oz), SpO2 95 %.  I/O:   Intake/Output Summary (Last 24 hours) at 02/10/16 1524 Last data filed at 02/10/16 1300  Gross per 24 hour  Intake           291.11 ml  Output             1050 ml  Net          -  758.89 ml    PHYSICAL EXAMINATION:   GENERAL:  75 y.o.-year-old patient lying in the be in no distress,  EYES: Pupils equal, round, reactive to light and accommodation. No scleral icterus. Extraocular muscles intact.  HEENT: Head atraumatic, normocephalic. Oropharynx and nasopharynx clear.  NECK:  Supple, no jugular venous distention. No thyroid enlargement, no tenderness.  LUNGS: Normal breath sounds bilaterally, no wheezing, few scattered rales,rhonchi and crepitations noted diffusely. No use of accessory muscles of respiration.  CARDIOVASCULAR: S1, S2 normal. No murmurs, rubs, or gallops.  ABDOMEN: Soft, tender in right side, no rebound or guarding was noted,mild  tenderness on right flank percussion, nondistended. Bowel sounds present. No organomegaly or mass.  EXTREMITIES: 1+ lower extremity and pedal edema on the left, less so on the right, no cyanosis, or clubbing.  NEUROLOGIC: Cranial nerves II through XII are intact. Muscle strength 5/5 in all extremities. Sensation intact. Gait not checked.  PSYCHIATRIC: The patient is alert and oriented x  3.  SKIN: No obvious rash, lesion, or ulcer.   DATA REVIEW:   CBC  Recent Labs Lab 02/10/16 0308  WBC 23.7*  HGB 10.8*  HCT 33.4*  PLT 194    Chemistries   Recent Labs Lab 02/06/16 1516  02/10/16 0308  NA 140  < > 137  K 3.6  < > 3.8  CL 109  < > 103  CO2 23  < > 29  GLUCOSE 236*  < > 210*  BUN 20  < > 25*  CREATININE 0.52  < > 0.88  CALCIUM 8.2*  < > 8.2*  AST 26  --   --   ALT 30  --   --   ALKPHOS 130*  --   --   BILITOT 0.2*  --   --   < > = values in this interval not displayed.  Cardiac Enzymes  Recent Labs Lab 02/06/16 1516  TROPONINI <0.03    Microbiology Results  Results for orders placed or performed during the hospital encounter of 02/06/16  Urine culture     Status: Abnormal   Collection Time: 02/06/16  3:16 PM  Result Value Ref Range Status   Specimen Description URINE, CLEAN CATCH  Final   Special Requests NONE  Final   Culture MULTIPLE SPECIES PRESENT, SUGGEST RECOLLECTION (A)  Final   Report Status 02/07/2016 FINAL  Final  Urine culture     Status: Abnormal   Collection Time: 02/08/16 12:28 PM  Result Value Ref Range Status   Specimen Description URINE, CATHETERIZED  Final   Special Requests NONE  Final   Culture MULTIPLE SPECIES PRESENT, SUGGEST RECOLLECTION (A)  Final   Report Status 02/09/2016 FINAL  Final    RADIOLOGY:  No results found.  EKG:   Orders placed or performed during the hospital encounter of 02/06/16  . EKG 12-Lead  . EKG 12-Lead      Management plans discussed with the patient, family and they are in agreement.  CODE STATUS:     Code Status Orders        Start     Ordered   02/06/16 1917  Full code  Continuous     02/06/16 1917    Code Status History    Date Active Date Inactive Code Status Order ID Comments User Context   01/24/2016  2:00 PM 01/25/2016  7:15 PM Full Code [id number]  Auburn Bilberry, MD Inpatient   11/25/2015  3:40 PM 11/26/2015  3:49 PM Full Code [id number]  Danella Deis  Jasper Loser, MD  Inpatient   11/05/2015 12:33 AM 11/06/2015  6:05 PM Full Code [id number]  Oralia Manis, MD Inpatient   09/13/2015 10:10 AM 09/15/2015  7:33 PM Full Code [id number]  Altamese Dilling, MD Inpatient      TOTAL TIME TAKING CARE OF THIS PATIENT: 35 minutes.    Altamese Dilling M.D on 02/10/2016 at 3:24 PM  Between 7am to 6pm - Pager - [phone]  After 6pm go to [email or web address] - password [email or web address]  [facility]  Office  [phone]  CC: Primary care physician; Leanna Sato, MD   Note: This dictation was prepared with Dragon dictation along with smaller phrase technology. Any transcriptional errors that result from this process are unintentional.

## 2016-02-10 NOTE — Progress Notes (Signed)
ANTICOAGULATION CONSULT NOTE  Pharmacy Consult for rivaroxaban Indication: renal thrombus/afib  No Known Allergies  Patient Measurements: Height: 5\' 4"  (162.6 cm) Weight: 158 lb 3.2 oz (71.8 kg) IBW/kg (Calculated) : 54.7  Vital Signs: Temp: 97.3 F (36.3 C) (12/01 1039) Temp Source: Oral (12/01 1039) BP: 142/75 (12/01 1039) Pulse Rate: 75 (12/01 1039)  Labs:  Recent Labs  02/08/16 0610  02/09/16 0024 02/09/16 0804 02/09/16 1842 02/10/16 0308  HGB 11.8*  --  11.5*  --   --  10.8*  HCT 36.2  --  34.4*  --   --  33.4*  PLT 243  --  221  --   --  194  HEPARINUNFRC 0.37  < > 0.37 0.11* 0.31 0.23*  CREATININE 0.98  --   --   --   --  0.88  < > = values in this interval not displayed.  Estimated Creatinine Clearance: 53.6 mL/min (by C-G formula based on SCr of 0.88 mg/dL).   Medical History: Past Medical History:  Diagnosis Date  . Afib (HCC)   . Anxiety   . Asthma   . CHF (congestive heart failure) (HCC)   . Coronary artery disease   . Diabetes mellitus without complication (HCC)   . Hyperlipidemia   . Hypertension     Assessment: Patient with afib, previously prescribed rivaroxaban with history of noncompliance. Currently on heparin drip for renal thromboembolism to transition to rivaroxaban   Plan:  Will initiate rivaroxaban 15mg  PO BID x 21 weeks, followed by 20mg  PO QPM. Heparin drip to stop at the time of first dose.   Counseled patient on importance of continuing to take Xarelto and the risk of further blood clots and stroke if she does not take medication.  Mikaiah Stoffer C 02/10/2016,3:06 PM

## 2016-02-10 NOTE — Discharge Instructions (Addendum)
Information on my medicine - XARELTO (rivaroxaban)  This medication education was reviewed with me or my healthcare representative as part of my discharge preparation.  The pharmacist that spoke with me during my hospital stay was:  Martyn Malay, [facility]  WHY WAS XARELTO PRESCRIBED FOR YOU? Xarelto was prescribed to treat blood clots that may have been found in the veins of your legs (deep vein thrombosis) or in your lungs (pulmonary embolism) and to reduce the risk of them occurring again.  What do you need to know about Xarelto? The starting dose is one 15 mg tablet taken TWICE daily with food for the FIRST 21 DAYS then on (enter date)  December 22nd  the dose is changed to one 20 mg tablet taken ONCE A DAY with your evening meal.  DO NOT stop taking Xarelto without talking to the health care provider who prescribed the medication.  Refill your prescription for 20 mg tablets before you run out.  After discharge, you should have regular check-up appointments with your healthcare provider that is prescribing your Xarelto.  In the future your dose may need to be changed if your kidney function changes by a significant amount.  What do you do if you miss a dose? If you are taking Xarelto TWICE DAILY and you miss a dose, take it as soon as you remember. You may take two 15 mg tablets (total 30 mg) at the same time then resume your regularly scheduled 15 mg twice daily the next day.  If you are taking Xarelto ONCE DAILY and you miss a dose, take it as soon as you remember on the same day then continue your regularly scheduled once daily regimen the next day. Do not take two doses of Xarelto at the same time.   Important Safety Information Xarelto is a blood thinner medicine that can cause bleeding. You should call your healthcare provider right away if you experience any of the following: ? Bleeding from an injury or your nose that does not stop. ? Unusual colored urine (red or dark brown)  or unusual colored stools (red or black). ? Unusual bruising for unknown reasons. ? A serious fall or if you hit your head (even if there is no bleeding).  Some medicines may interact with Xarelto and might increase your risk of bleeding while on Xarelto. To help avoid this, consult your healthcare provider or pharmacist prior to using any new prescription or non-prescription medications, including herbals, vitamins, non-steroidal anti-inflammatory drugs (NSAIDs) and supplements.  This website has more information on Xarelto: VisitDestination.com.br.

## 2016-02-10 NOTE — Care Management (Signed)
Remains on heparin drip for her renal thrombosis.  She is agreeable to skilled nursing placement but this CM feels when the time of discharge comes, patient will most likely decline snf and return home with home health SN PT Aide and SW.  Updated Robin [name] with Frances Furbish

## 2016-02-15 ENCOUNTER — Other Ambulatory Visit
Admission: RE | Admit: 2016-02-15 | Discharge: 2016-02-15 | Disposition: A | Discharge status: Home / Self Care | Payer: Medicare Other | Source: Ambulatory Visit | Attending: Family Medicine | Admitting: Family Medicine

## 2016-02-15 DIAGNOSIS — E119 Type 2 diabetes mellitus without complications: Secondary | ICD-10-CM | POA: Insufficient documentation

## 2016-02-15 LAB — COMPREHENSIVE METABOLIC PANEL
ALBUMIN: 2.8 g/dL — AB (ref 3.5–5.0)
ALT: 26 U/L (ref 14–54)
ANION GAP: 9 (ref 5–15)
AST: 22 U/L (ref 15–41)
Alkaline Phosphatase: 132 U/L — ABNORMAL HIGH (ref 38–126)
BUN: 18 mg/dL (ref 6–20)
CHLORIDE: 100 mmol/L — AB (ref 101–111)
CO2: 24 mmol/L (ref 22–32)
Calcium: 8.5 mg/dL — ABNORMAL LOW (ref 8.9–10.3)
Creatinine, Ser: 0.78 mg/dL (ref 0.44–1.00)
GFR calc non Af Amer: >60 mL/min (ref >60)
GLUCOSE: 275 mg/dL — AB (ref 65–99)
POTASSIUM: 5.7 mmol/L — AB (ref 3.5–5.1)
SODIUM: 133 mmol/L — AB (ref 135–145)
Total Bilirubin: 0.5 mg/dL (ref 0.3–1.2)
Total Protein: 6 g/dL — ABNORMAL LOW (ref 6.5–8.1)

## 2016-02-15 LAB — CBC WITH DIFFERENTIAL/PLATELET
Basophils Absolute: 0.2 10*3/uL — ABNORMAL HIGH (ref 0–0.1)
Basophils Relative: 1 %
EOS ABS: 0.1 10*3/uL (ref 0–0.7)
EOS PCT: 1 %
HCT: 35.3 % (ref 35.0–47.0)
Hemoglobin: 11.6 g/dL — ABNORMAL LOW (ref 12.0–16.0)
LYMPHS ABS: 2 10*3/uL (ref 1.0–3.6)
Lymphocytes Relative: 16 %
MCH: 27.7 pg (ref 26.0–34.0)
MCHC: 32.9 g/dL (ref 32.0–36.0)
MCV: 84.1 fL (ref 80.0–100.0)
MONOS PCT: 8 %
Monocytes Absolute: 1 10*3/uL — ABNORMAL HIGH (ref 0.2–0.9)
NEUTROS PCT: 74 %
Neutro Abs: 9.6 10*3/uL — ABNORMAL HIGH (ref 1.4–6.5)
PLATELETS: 279 10*3/uL (ref 150–440)
RBC: 4.2 MIL/uL (ref 3.80–5.20)
RDW: 14.2 % (ref 11.5–14.5)
WBC: 12.9 10*3/uL — AB (ref 3.6–11.0)

## 2016-02-17 ENCOUNTER — Emergency Department
Admission: EM | Admit: 2016-02-17 | Discharge: 2016-02-17 | Disposition: A | Discharge status: Home / Self Care | Payer: No Typology Code available for payment source | Attending: Emergency Medicine | Admitting: Emergency Medicine

## 2016-02-17 ENCOUNTER — Emergency Department: Payer: No Typology Code available for payment source

## 2016-02-17 DIAGNOSIS — I5022 Chronic systolic (congestive) heart failure: Secondary | ICD-10-CM | POA: Diagnosis not present

## 2016-02-17 DIAGNOSIS — I110 Hypertensive heart disease with heart failure: Secondary | ICD-10-CM | POA: Insufficient documentation

## 2016-02-17 DIAGNOSIS — E119 Type 2 diabetes mellitus without complications: Secondary | ICD-10-CM | POA: Insufficient documentation

## 2016-02-17 DIAGNOSIS — Y999 Unspecified external cause status: Secondary | ICD-10-CM | POA: Insufficient documentation

## 2016-02-17 DIAGNOSIS — Y92410 Unspecified street and highway as the place of occurrence of the external cause: Secondary | ICD-10-CM | POA: Insufficient documentation

## 2016-02-17 DIAGNOSIS — S46911A Strain of unspecified muscle, fascia and tendon at shoulder and upper arm level, right arm, initial encounter: Secondary | ICD-10-CM | POA: Insufficient documentation

## 2016-02-17 DIAGNOSIS — J45909 Unspecified asthma, uncomplicated: Secondary | ICD-10-CM | POA: Insufficient documentation

## 2016-02-17 DIAGNOSIS — S4991XA Unspecified injury of right shoulder and upper arm, initial encounter: Secondary | ICD-10-CM | POA: Diagnosis present

## 2016-02-17 DIAGNOSIS — Y939 Activity, unspecified: Secondary | ICD-10-CM | POA: Diagnosis not present

## 2016-02-17 DIAGNOSIS — I2510 Atherosclerotic heart disease of native coronary artery without angina pectoris: Secondary | ICD-10-CM | POA: Diagnosis not present

## 2016-02-17 LAB — HEMOGLOBIN A1C
Hgb A1c MFr Bld: 8.7 % — ABNORMAL HIGH (ref 4.8–5.6)
Mean Plasma Glucose: 203 mg/dL

## 2016-02-17 MED ORDER — ACETAMINOPHEN 325 MG PO TABS
650.0000 mg | ORAL_TABLET | Freq: Once | ORAL | Status: AC
  Administered 2016-02-17: 650 mg via ORAL
  Filled 2016-02-17: qty 2

## 2016-02-17 NOTE — Discharge Instructions (Signed)
Please use the shoulder sling as needed for shoulder rest. You do not need to keep it in the shoulder sling at all times. Continue with over-the-counter pain medication such as Tylenol. Ice the area of discomfort tonight to decrease swelling. Return to emergency department if he develops shortness of breath, chest pain, increasing abdominal pain, or any other new concern.

## 2016-02-17 NOTE — ED Notes (Signed)
Patient transported to X-ray 
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
## 2016-02-17 NOTE — ED Triage Notes (Signed)
Pt ambulatory at scene

## 2016-02-21 ENCOUNTER — Encounter (INDEPENDENT_AMBULATORY_CARE_PROVIDER_SITE_OTHER): Payer: Medicare Other | Admitting: Vascular Surgery

## 2016-02-24 ENCOUNTER — Encounter (INDEPENDENT_AMBULATORY_CARE_PROVIDER_SITE_OTHER): Payer: Medicare Other | Admitting: Vascular Surgery

## 2016-02-27 ENCOUNTER — Inpatient Hospital Stay
Admission: EM | Admit: 2016-02-27 | Discharge: 2016-03-05 | DRG: 673 | Disposition: A | Discharge status: Home Health | Payer: Medicare Other | Attending: Internal Medicine | Admitting: Internal Medicine

## 2016-02-27 ENCOUNTER — Encounter: Payer: Self-pay | Admitting: Emergency Medicine

## 2016-02-27 ENCOUNTER — Emergency Department: Payer: Medicare Other

## 2016-02-27 DIAGNOSIS — I214 Non-ST elevation (NSTEMI) myocardial infarction: Secondary | ICD-10-CM | POA: Diagnosis not present

## 2016-02-27 DIAGNOSIS — I160 Hypertensive urgency: Secondary | ICD-10-CM | POA: Diagnosis present

## 2016-02-27 DIAGNOSIS — E785 Hyperlipidemia, unspecified: Secondary | ICD-10-CM | POA: Diagnosis present

## 2016-02-27 DIAGNOSIS — I10 Essential (primary) hypertension: Secondary | ICD-10-CM | POA: Diagnosis present

## 2016-02-27 DIAGNOSIS — Z951 Presence of aortocoronary bypass graft: Secondary | ICD-10-CM

## 2016-02-27 DIAGNOSIS — I2510 Atherosclerotic heart disease of native coronary artery without angina pectoris: Secondary | ICD-10-CM | POA: Diagnosis present

## 2016-02-27 DIAGNOSIS — I5023 Acute on chronic systolic (congestive) heart failure: Secondary | ICD-10-CM | POA: Diagnosis present

## 2016-02-27 DIAGNOSIS — R262 Difficulty in walking, not elsewhere classified: Secondary | ICD-10-CM

## 2016-02-27 DIAGNOSIS — Z79899 Other long term (current) drug therapy: Secondary | ICD-10-CM

## 2016-02-27 DIAGNOSIS — I774 Celiac artery compression syndrome: Secondary | ICD-10-CM | POA: Diagnosis present

## 2016-02-27 DIAGNOSIS — E1122 Type 2 diabetes mellitus with diabetic chronic kidney disease: Secondary | ICD-10-CM | POA: Diagnosis present

## 2016-02-27 DIAGNOSIS — I130 Hypertensive heart and chronic kidney disease with heart failure and stage 1 through stage 4 chronic kidney disease, or unspecified chronic kidney disease: Secondary | ICD-10-CM | POA: Diagnosis present

## 2016-02-27 DIAGNOSIS — I701 Atherosclerosis of renal artery: Secondary | ICD-10-CM | POA: Diagnosis present

## 2016-02-27 DIAGNOSIS — I5022 Chronic systolic (congestive) heart failure: Secondary | ICD-10-CM | POA: Diagnosis present

## 2016-02-27 DIAGNOSIS — I7410 Embolism and thrombosis of unspecified parts of aorta: Secondary | ICD-10-CM | POA: Diagnosis present

## 2016-02-27 DIAGNOSIS — N179 Acute kidney failure, unspecified: Secondary | ICD-10-CM | POA: Diagnosis present

## 2016-02-27 DIAGNOSIS — I150 Renovascular hypertension: Secondary | ICD-10-CM | POA: Diagnosis present

## 2016-02-27 DIAGNOSIS — I4891 Unspecified atrial fibrillation: Secondary | ICD-10-CM | POA: Diagnosis present

## 2016-02-27 DIAGNOSIS — N280 Ischemia and infarction of kidney: Secondary | ICD-10-CM | POA: Diagnosis not present

## 2016-02-27 DIAGNOSIS — R1013 Epigastric pain: Secondary | ICD-10-CM

## 2016-02-27 DIAGNOSIS — F419 Anxiety disorder, unspecified: Secondary | ICD-10-CM | POA: Diagnosis present

## 2016-02-27 DIAGNOSIS — J45909 Unspecified asthma, uncomplicated: Secondary | ICD-10-CM | POA: Diagnosis present

## 2016-02-27 DIAGNOSIS — I482 Chronic atrial fibrillation: Secondary | ICD-10-CM | POA: Diagnosis present

## 2016-02-27 DIAGNOSIS — I513 Intracardiac thrombosis, not elsewhere classified: Secondary | ICD-10-CM | POA: Diagnosis present

## 2016-02-27 DIAGNOSIS — E119 Type 2 diabetes mellitus without complications: Secondary | ICD-10-CM

## 2016-02-27 DIAGNOSIS — F329 Major depressive disorder, single episode, unspecified: Secondary | ICD-10-CM | POA: Diagnosis present

## 2016-02-27 DIAGNOSIS — Z7901 Long term (current) use of anticoagulants: Secondary | ICD-10-CM

## 2016-02-27 DIAGNOSIS — F0390 Unspecified dementia without behavioral disturbance: Secondary | ICD-10-CM | POA: Diagnosis present

## 2016-02-27 DIAGNOSIS — N183 Chronic kidney disease, stage 3 (moderate): Secondary | ICD-10-CM | POA: Diagnosis present

## 2016-02-27 DIAGNOSIS — F03A0 Unspecified dementia, mild, without behavioral disturbance, psychotic disturbance, mood disturbance, and anxiety: Secondary | ICD-10-CM

## 2016-02-27 DIAGNOSIS — Z9114 Patient's other noncompliance with medication regimen: Secondary | ICD-10-CM

## 2016-02-27 LAB — URINALYSIS, COMPLETE (UACMP) WITH MICROSCOPIC
Bilirubin Urine: NEGATIVE
Glucose, UA: >=500 mg/dL — AB
Hgb urine dipstick: NEGATIVE
Ketones, ur: 5 mg/dL — AB
Leukocytes, UA: NEGATIVE
Nitrite: NEGATIVE
PH: 7 (ref 5.0–8.0)
Protein, ur: >=300 mg/dL — AB
SPECIFIC GRAVITY, URINE: 1.014 (ref 1.005–1.030)

## 2016-02-27 LAB — COMPREHENSIVE METABOLIC PANEL
ALT: 16 U/L (ref 14–54)
AST: 22 U/L (ref 15–41)
Albumin: 3.6 g/dL (ref 3.5–5.0)
Alkaline Phosphatase: 114 U/L (ref 38–126)
Anion gap: 10 (ref 5–15)
BILIRUBIN TOTAL: 0.5 mg/dL (ref 0.3–1.2)
BUN: 23 mg/dL — AB (ref 6–20)
CHLORIDE: 102 mmol/L (ref 101–111)
CO2: 25 mmol/L (ref 22–32)
CREATININE: 1.12 mg/dL — AB (ref 0.44–1.00)
Calcium: 8.8 mg/dL — ABNORMAL LOW (ref 8.9–10.3)
GFR calc Af Amer: 54 mL/min — ABNORMAL LOW (ref >60)
GFR, EST NON AFRICAN AMERICAN: 47 mL/min — AB (ref >60)
Glucose, Bld: 259 mg/dL — ABNORMAL HIGH (ref 65–99)
Potassium: 4.1 mmol/L (ref 3.5–5.1)
Sodium: 137 mmol/L (ref 135–145)
Total Protein: 7.6 g/dL (ref 6.5–8.1)

## 2016-02-27 LAB — CBC WITH DIFFERENTIAL/PLATELET
BASOS ABS: 0.2 10*3/uL — AB (ref 0–0.1)
Basophils Relative: 1 %
EOS PCT: 0 %
Eosinophils Absolute: 0 10*3/uL (ref 0–0.7)
HCT: 37.2 % (ref 35.0–47.0)
Hemoglobin: 12.1 g/dL (ref 12.0–16.0)
LYMPHS ABS: 1.4 10*3/uL (ref 1.0–3.6)
LYMPHS PCT: 10 %
MCH: 26.9 pg (ref 26.0–34.0)
MCHC: 32.5 g/dL (ref 32.0–36.0)
MCV: 82.8 fL (ref 80.0–100.0)
MONO ABS: 0.7 10*3/uL (ref 0.2–0.9)
Monocytes Relative: 5 %
Neutro Abs: 11.4 10*3/uL — ABNORMAL HIGH (ref 1.4–6.5)
Neutrophils Relative %: 84 %
PLATELETS: 357 10*3/uL (ref 150–440)
RBC: 4.5 MIL/uL (ref 3.80–5.20)
RDW: 14.1 % (ref 11.5–14.5)
WBC: 13.7 10*3/uL — ABNORMAL HIGH (ref 3.6–11.0)

## 2016-02-27 LAB — TROPONIN I: Troponin I: 0.03 ng/mL (ref <0.03)

## 2016-02-27 LAB — LIPASE, BLOOD: LIPASE: 13 U/L (ref 11–51)

## 2016-02-27 MED ORDER — IOPAMIDOL (ISOVUE-370) INJECTION 76%
100.0000 mL | Freq: Once | INTRAVENOUS | Status: AC | PRN
  Administered 2016-02-27: 100 mL via INTRAVENOUS

## 2016-02-27 MED ORDER — SODIUM CHLORIDE 0.9 % IV BOLUS (SEPSIS)
1000.0000 mL | Freq: Once | INTRAVENOUS | Status: AC
  Administered 2016-02-27: 1000 mL via INTRAVENOUS

## 2016-02-27 MED ORDER — LABETALOL HCL 5 MG/ML IV SOLN
10.0000 mg | Freq: Once | INTRAVENOUS | Status: AC
  Administered 2016-02-27: 10 mg via INTRAVENOUS
  Filled 2016-02-27: qty 4

## 2016-02-27 MED ORDER — ONDANSETRON HCL 4 MG/2ML IJ SOLN
4.0000 mg | Freq: Once | INTRAMUSCULAR | Status: AC
  Administered 2016-02-27: 4 mg via INTRAVENOUS
  Filled 2016-02-27: qty 2

## 2016-02-27 NOTE — ED Notes (Signed)
Patient transported to CT at this time. 

## 2016-02-27 NOTE — ED Notes (Signed)
Dr Scotty Court notified of critical troponin (0.03) as reported by lab at 1105 pm.

## 2016-02-27 NOTE — ED Provider Notes (Signed)
[facility] Emergency Department Provider Note  ____________________________________________  Time seen: Approximately 11:20 PM  I have reviewed the triage vital signs and the nursing notes.   HISTORY  Chief Complaint Hypertension    HPI Robin [name] is a 75 y.o. female who complains of severe epigastric pain radiating to her back with nausea that started today. No vomiting or diarrhea. No chest pain or shortness of breath. No fever. Denies pain like this before. Reports a history ofrenal artery thrombosis.  She does have a history of medical noncompliance. She is currently supposed to be on multiple antihypertensives as well as Xa inhibitor for anticoagulation. Unclear if she is currently taking these medications.  Denies any significant chest pain or headache or vision changes. No numbness tingling or weakness.     Past Medical History:  Diagnosis Date  . Afib (HCC)   . Anxiety   . Asthma   . CHF (congestive heart failure) (HCC)   . Coronary artery disease   . Diabetes mellitus without complication (HCC)   . Hyperlipidemia   . Hypertension      Patient Active Problem List   Diagnosis Date Noted  . Noncompliance 02/07/2016  . Renal infarct (HCC) 02/06/2016  . Atrial fibrillation with rapid ventricular response (HCC) 02/06/2016  . A-fib (HCC) 01/24/2016  . HTN (hypertension) 11/04/2015  . HLD (hyperlipidemia) 11/04/2015  . CAD (coronary artery disease) 11/04/2015  . Chronic systolic CHF (congestive heart failure) (HCC) 11/04/2015  . Diabetes (HCC) 11/04/2015     Past Surgical History:  Procedure Laterality Date  . CORONARY ARTERY BYPASS GRAFT    . NO PAST SURGERIES       Prior to Admission medications   Medication Sig Start Date End Date Taking? Authorizing Provider  albuterol (ACCUNEB) 1.25 MG/3ML nebulizer solution Take 1.25 mg by nebulization every 4 (four) hours as needed for wheezing or shortness of breath.     Historical  Provider, MD  diltiazem (CARDIZEM CD) 180 MG 24 hr capsule Take 1 capsule (180 mg total) by mouth 2 (two) times daily. 02/10/16   Altamese DillingVaibhavkumar Vachhani, MD  meclizine (ANTIVERT) 25 MG tablet Take 1 tablet (25 mg total) by mouth 3 (three) times daily as needed for dizziness. 11/06/15   Enid Baasadhika Kalisetti, MD  metoprolol (LOPRESSOR) 50 MG tablet Take 1 tablet (50 mg total) by mouth 3 (three) times daily. 02/10/16   Altamese DillingVaibhavkumar Vachhani, MD  nitroGLYCERIN (NITROSTAT) 0.4 MG SL tablet Place 1 tablet (0.4 mg total) under the tongue every 5 (five) minutes as needed for chest pain. 01/25/16   Adrian SaranSital Mody, MD  ondansetron (ZOFRAN ODT) 4 MG disintegrating tablet Take 1 tablet (4 mg total) by mouth every 8 (eight) hours as needed for nausea or vomiting. 11/06/15   Enid Baasadhika Kalisetti, MD  pantoprazole (PROTONIX) 40 MG tablet Take 1 tablet (40 mg total) by mouth daily. Patient taking differently: Take 40 mg by mouth daily as needed. For acid reflux 11/06/15   Enid Baasadhika Kalisetti, MD  Rivaroxaban (XARELTO) 15 MG TABS tablet Take 1 tablet (15 mg total) by mouth 2 (two) times daily with a meal. 02/11/16 03/03/16  Altamese DillingVaibhavkumar Vachhani, MD  rivaroxaban (XARELTO) 20 MG TABS tablet Take 1 tablet (20 mg total) by mouth daily with supper. 03/03/16   Altamese DillingVaibhavkumar Vachhani, MD  traMADol (ULTRAM) 50 MG tablet Take 1 tablet (50 mg total) by mouth every 12 (twelve) hours as needed for moderate pain or severe pain. 02/10/16   Altamese DillingVaibhavkumar Vachhani, MD     Allergies  Patient has no known allergies.   Family History  Problem Relation Age of Onset  . Hypertension Mother   . Heart failure Mother   . Heart disease Father     Social History Social History  Substance Use Topics  . Smoking status: Never Smoker  . Smokeless tobacco: Never Used  . Alcohol use No    Review of Systems  Constitutional:   No fever or chills.  ENT:   No sore throat. No rhinorrhea. Cardiovascular:   No chest pain. Respiratory:   No dyspnea or  cough. Gastrointestinal:   Positive epigastric pain without, vomiting and diarrhea.  Genitourinary:   Negative for dysuria or difficulty urinating. Musculoskeletal:   Negative for focal pain or swelling Neurological:   Mild bilateral headache after car drive which she relates to chronic motion sickness. Gradual onset, not thunderclap 10-point ROS otherwise negative.  ____________________________________________   PHYSICAL EXAM:  VITAL SIGNS: ED Triage Vitals  Enc Vitals Group     BP 02/27/16 2140 (!) 220/84     Pulse Rate 02/27/16 2140 78     Resp 02/27/16 2140 20     Temp 02/27/16 2140 97.7 F (36.5 C)     Temp Source 02/27/16 2140 Oral     SpO2 02/27/16 2140 95 %     Weight 02/27/16 2136 158 lb (71.7 kg)     Height 02/27/16 2136 5\' 4"  (1.626 m)     Head Circumference --      Peak Flow --      Pain Score 02/27/16 2136 10     Pain Loc --      Pain Edu? --      Excl. in GC? --     Vital signs reviewed, nursing assessments reviewed.   Constitutional:   Alert and oriented.Uncomfortable but not in distress. Eyes:   No scleral icterus. No conjunctival pallor. PERRL. EOMI.  No nystagmus. ENT   Head:   Normocephalic and atraumatic.   Nose:   No congestion/rhinnorhea. No septal hematoma   Mouth/Throat:   MMM, no pharyngeal erythema. No peritonsillar mass.    Neck:   No stridor. No SubQ emphysema. No meningismus. Hematological/Lymphatic/Immunilogical:   No cervical lymphadenopathy. Cardiovascular:   RRR. Symmetric bilateral radial and DP pulses.  No murmurs.  Respiratory:   Normal respiratory effort without tachypnea nor retractions. Breath sounds are clear and equal bilaterally. No wheezes/rales/rhonchi. Gastrointestinal:   Soft with epigastric tenderness. Non distended. There is no CVA tenderness.  No rebound, rigidity, or guarding. Genitourinary:   deferred Musculoskeletal:   Nontender with normal range of motion in all extremities. No joint effusions.  No lower  extremity tenderness.  No edema. Neurologic:   Normal speech and language.  CN 2-10 normal. Motor grossly intact. No gross focal neurologic deficits are appreciated.  Skin:    Skin is warm, dry and intact. No rash noted.  No petechiae, purpura, or bullae.  ____________________________________________    LABS (pertinent positives/negatives) (all labs ordered are listed, but only abnormal results are displayed) Labs Reviewed  COMPREHENSIVE METABOLIC PANEL - Abnormal; Notable for the following:       Result Value   Glucose, Bld 259 (*)    BUN 23 (*)    Creatinine, Ser 1.12 (*)    Calcium 8.8 (*)    GFR calc non Af Amer 47 (*)    GFR calc Af Amer 54 (*)    All other components within normal limits  CBC WITH DIFFERENTIAL/PLATELET - Abnormal; Notable for  the following:    WBC 13.7 (*)    Neutro Abs 11.4 (*)    Basophils Absolute 0.2 (*)    All other components within normal limits  TROPONIN I - Abnormal; Notable for the following:    Troponin I 0.03 (*)    All other components within normal limits  URINALYSIS, COMPLETE (UACMP) WITH MICROSCOPIC - Abnormal; Notable for the following:    Color, Urine YELLOW (*)    APPearance CLEAR (*)    Glucose, UA >=500 (*)    Ketones, ur 5 (*)    Protein, ur >=300 (*)    Bacteria, UA RARE (*)    Squamous Epithelial / LPF 0-5 (*)    All other components within normal limits  LIPASE, BLOOD  TROPONIN I   ____________________________________________   EKG  Interpreted by me Normal sinus rhythm rate of 69, left axis, normal intervals. Normal QRS ST segments and T waves  ____________________________________________    RADIOLOGY  CT angiogram chest abdomen and pelvis pending  ____________________________________________   PROCEDURES Procedures CRITICAL CARE Performed by: Scotty Court, Donta Fuster   Total critical care time: 35 minutes  Critical care time was exclusive of separately billable procedures and treating other  patients.  Critical care was necessary to treat or prevent imminent or life-threatening deterioration.  Critical care was time spent personally by me on the following activities: development of treatment plan with patient and/or surrogate as well as nursing, discussions with consultants, evaluation of patient's response to treatment, examination of patient, obtaining history from patient or surrogate, ordering and performing treatments and interventions, ordering and review of laboratory studies, ordering and review of radiographic studies, pulse oximetry and re-evaluation of patient's condition.  ____________________________________________   INITIAL IMPRESSION / ASSESSMENT AND PLAN / ED COURSE  Pertinent labs & imaging results that were available during my care of the patient were reviewed by me and considered in my medical decision making (see chart for details).  Patient presents with epigastric pain with severe hypertension. Concern for AAA versus dissection. We'll get CT angiogram. Possible worsening of renal infarct previously identified if she has not been compliant with her medications. Check labs, fluids pain control antiemetics as needed.  I've ordered the patient IV labetalol to begin controlling her blood pressure as it is markedly elevated at about 220/80 with worrisome symptoms.. No signs or symptoms of vascular pathology related to the heart lungs or brain at this time.  Case signed out to Dr. Manson Passey pending workup.     Clinical Course    ____________________________________________   FINAL CLINICAL IMPRESSION(S) / ED DIAGNOSES  Final diagnoses:  Hypertension, unspecified type  Epigastric pain      New Prescriptions   No medications on file     Portions of this note were generated with dragon dictation software. Dictation errors may occur despite best attempts at proofreading.    Sharman Cheek, MD 02/27/16 [phone]
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
## 2016-02-27 NOTE — ED Notes (Signed)
Pt's O2 sats noted to be at 85-88% on RA. Pt placed on 2L O2 via [place] at this time to improve O2 stats.

## 2016-02-28 ENCOUNTER — Encounter: Payer: Self-pay | Admitting: Internal Medicine

## 2016-02-28 ENCOUNTER — Inpatient Hospital Stay
Admit: 2016-02-28 | Discharge: 2016-02-28 | Disposition: A | Discharge status: Home / Self Care | Payer: Medicare Other | Attending: Internal Medicine | Admitting: Internal Medicine

## 2016-02-28 DIAGNOSIS — F419 Anxiety disorder, unspecified: Secondary | ICD-10-CM | POA: Diagnosis present

## 2016-02-28 DIAGNOSIS — F0390 Unspecified dementia without behavioral disturbance: Secondary | ICD-10-CM | POA: Diagnosis present

## 2016-02-28 DIAGNOSIS — I2510 Atherosclerotic heart disease of native coronary artery without angina pectoris: Secondary | ICD-10-CM | POA: Diagnosis present

## 2016-02-28 DIAGNOSIS — F329 Major depressive disorder, single episode, unspecified: Secondary | ICD-10-CM | POA: Diagnosis present

## 2016-02-28 DIAGNOSIS — N183 Chronic kidney disease, stage 3 (moderate): Secondary | ICD-10-CM | POA: Diagnosis present

## 2016-02-28 DIAGNOSIS — I774 Celiac artery compression syndrome: Secondary | ICD-10-CM | POA: Diagnosis present

## 2016-02-28 DIAGNOSIS — Z79899 Other long term (current) drug therapy: Secondary | ICD-10-CM | POA: Diagnosis not present

## 2016-02-28 DIAGNOSIS — I5022 Chronic systolic (congestive) heart failure: Secondary | ICD-10-CM | POA: Diagnosis present

## 2016-02-28 DIAGNOSIS — J45909 Unspecified asthma, uncomplicated: Secondary | ICD-10-CM | POA: Diagnosis present

## 2016-02-28 DIAGNOSIS — N280 Ischemia and infarction of kidney: Principal | ICD-10-CM

## 2016-02-28 DIAGNOSIS — I701 Atherosclerosis of renal artery: Secondary | ICD-10-CM | POA: Diagnosis present

## 2016-02-28 DIAGNOSIS — E1122 Type 2 diabetes mellitus with diabetic chronic kidney disease: Secondary | ICD-10-CM | POA: Diagnosis present

## 2016-02-28 DIAGNOSIS — Z951 Presence of aortocoronary bypass graft: Secondary | ICD-10-CM | POA: Diagnosis not present

## 2016-02-28 DIAGNOSIS — I4891 Unspecified atrial fibrillation: Secondary | ICD-10-CM | POA: Diagnosis not present

## 2016-02-28 DIAGNOSIS — Z7901 Long term (current) use of anticoagulants: Secondary | ICD-10-CM | POA: Diagnosis not present

## 2016-02-28 DIAGNOSIS — R112 Nausea with vomiting, unspecified: Secondary | ICD-10-CM | POA: Diagnosis not present

## 2016-02-28 DIAGNOSIS — I150 Renovascular hypertension: Secondary | ICD-10-CM | POA: Diagnosis present

## 2016-02-28 DIAGNOSIS — M545 Low back pain: Secondary | ICD-10-CM

## 2016-02-28 DIAGNOSIS — I7410 Embolism and thrombosis of unspecified parts of aorta: Secondary | ICD-10-CM | POA: Diagnosis present

## 2016-02-28 DIAGNOSIS — E785 Hyperlipidemia, unspecified: Secondary | ICD-10-CM | POA: Diagnosis present

## 2016-02-28 DIAGNOSIS — I130 Hypertensive heart and chronic kidney disease with heart failure and stage 1 through stage 4 chronic kidney disease, or unspecified chronic kidney disease: Secondary | ICD-10-CM | POA: Diagnosis present

## 2016-02-28 DIAGNOSIS — I160 Hypertensive urgency: Secondary | ICD-10-CM | POA: Diagnosis present

## 2016-02-28 DIAGNOSIS — I214 Non-ST elevation (NSTEMI) myocardial infarction: Secondary | ICD-10-CM | POA: Diagnosis not present

## 2016-02-28 DIAGNOSIS — I10 Essential (primary) hypertension: Secondary | ICD-10-CM | POA: Diagnosis present

## 2016-02-28 DIAGNOSIS — F03A0 Unspecified dementia, mild, without behavioral disturbance, psychotic disturbance, mood disturbance, and anxiety: Secondary | ICD-10-CM

## 2016-02-28 DIAGNOSIS — I513 Intracardiac thrombosis, not elsewhere classified: Secondary | ICD-10-CM | POA: Diagnosis present

## 2016-02-28 DIAGNOSIS — R1013 Epigastric pain: Secondary | ICD-10-CM | POA: Diagnosis present

## 2016-02-28 DIAGNOSIS — I482 Chronic atrial fibrillation: Secondary | ICD-10-CM | POA: Diagnosis present

## 2016-02-28 DIAGNOSIS — N179 Acute kidney failure, unspecified: Secondary | ICD-10-CM | POA: Diagnosis present

## 2016-02-28 DIAGNOSIS — Z9114 Patient's other noncompliance with medication regimen: Secondary | ICD-10-CM | POA: Diagnosis not present

## 2016-02-28 LAB — TROPONIN I
TROPONIN I: 0.03 ng/mL — AB (ref <0.03)
Troponin I: 0.03 ng/mL (ref <0.03)
Troponin I: 0.03 ng/mL (ref <0.03)

## 2016-02-28 LAB — CBC
HCT: 33.3 % — ABNORMAL LOW (ref 35.0–47.0)
HEMOGLOBIN: 11.1 g/dL — AB (ref 12.0–16.0)
MCH: 27.5 pg (ref 26.0–34.0)
MCHC: 33.2 g/dL (ref 32.0–36.0)
MCV: 82.9 fL (ref 80.0–100.0)
PLATELETS: 325 10*3/uL (ref 150–440)
RBC: 4.02 MIL/uL (ref 3.80–5.20)
RDW: 14.1 % (ref 11.5–14.5)
WBC: 13.6 10*3/uL — AB (ref 3.6–11.0)

## 2016-02-28 LAB — BASIC METABOLIC PANEL
ANION GAP: 8 (ref 5–15)
BUN: 22 mg/dL — ABNORMAL HIGH (ref 6–20)
CO2: 23 mmol/L (ref 22–32)
CREATININE: 1.3 mg/dL — AB (ref 0.44–1.00)
Calcium: 8 mg/dL — ABNORMAL LOW (ref 8.9–10.3)
Chloride: 103 mmol/L (ref 101–111)
GFR calc non Af Amer: 39 mL/min — ABNORMAL LOW (ref >60)
GFR, EST AFRICAN AMERICAN: 45 mL/min — AB (ref >60)
Glucose, Bld: 336 mg/dL — ABNORMAL HIGH (ref 65–99)
POTASSIUM: 4.1 mmol/L (ref 3.5–5.1)
SODIUM: 134 mmol/L — AB (ref 135–145)

## 2016-02-28 LAB — GLUCOSE, CAPILLARY
GLUCOSE-CAPILLARY: 309 mg/dL — AB (ref 65–99)
GLUCOSE-CAPILLARY: 239 mg/dL — AB (ref 65–99)
GLUCOSE-CAPILLARY: 144 mg/dL — AB (ref 65–99)
Glucose-Capillary: 304 mg/dL — ABNORMAL HIGH (ref 65–99)
Glucose-Capillary: 209 mg/dL — ABNORMAL HIGH (ref 65–99)

## 2016-02-28 LAB — APTT
APTT: 31 s (ref 24–36)
APTT: 54 s — AB (ref 24–36)
aPTT: 92 seconds — ABNORMAL HIGH (ref 24–36)

## 2016-02-28 LAB — HEPARIN LEVEL (UNFRACTIONATED)
HEPARIN UNFRACTIONATED: 0.52 [IU]/mL (ref 0.30–0.70)
Heparin Unfractionated: <0.1 IU/mL — ABNORMAL LOW (ref 0.30–0.70)

## 2016-02-28 LAB — MRSA PCR SCREENING: MRSA by PCR: NEGATIVE

## 2016-02-28 LAB — PROTIME-INR
INR: 1.06
PROTHROMBIN TIME: 13.8 s (ref 11.4–15.2)

## 2016-02-28 MED ORDER — INSULIN ASPART 100 UNIT/ML ~~LOC~~ SOLN
7.0000 [IU] | Freq: Once | SUBCUTANEOUS | Status: AC
  Administered 2016-02-28: 7 [IU] via SUBCUTANEOUS

## 2016-02-28 MED ORDER — HEPARIN (PORCINE) IN NACL 100-0.45 UNIT/ML-% IJ SOLN: 14.0000 [IU]/kg/h | Freq: Once | INTRAMUSCULAR | Status: DC

## 2016-02-28 MED ORDER — INSULIN GLARGINE 100 UNIT/ML ~~LOC~~ SOLN
10.0000 [IU] | Freq: Every day | SUBCUTANEOUS | Status: DC
  Administered 2016-02-28 – 2016-02-29 (×2): 10 [IU] via SUBCUTANEOUS
  Filled 2016-02-28 (×4): qty 0.1

## 2016-02-28 MED ORDER — LABETALOL HCL 5 MG/ML IV SOLN: 20.0000 mg | INTRAVENOUS | Status: DC | PRN

## 2016-02-28 MED ORDER — ACETAMINOPHEN 650 MG RE SUPP: 650.0000 mg | Freq: Four times a day (QID) | RECTAL | Status: DC | PRN

## 2016-02-28 MED ORDER — NICARDIPINE HCL IN NACL 20-0.86 MG/200ML-% IV SOLN
INTRAVENOUS | Status: AC
Start: 2016-02-28 — End: 2016-02-28
  Administered 2016-02-28: 5 mg/h via INTRAVENOUS
  Filled 2016-02-28: qty 200

## 2016-02-28 MED ORDER — ISOPROTERENOL HCL 0.2 MG/ML IJ SOLN
2.0000 ug/min | INTRAMUSCULAR | Status: DC
  Filled 2016-02-28: qty 5

## 2016-02-28 MED ORDER — METOPROLOL TARTRATE 50 MG PO TABS
50.0000 mg | ORAL_TABLET | Freq: Two times a day (BID) | ORAL | Status: DC
  Administered 2016-02-28 – 2016-03-05 (×14): 50 mg via ORAL
  Filled 2016-02-28 (×16): qty 1

## 2016-02-28 MED ORDER — METOPROLOL TARTRATE 50 MG PO TABS: 50.0000 mg | ORAL_TABLET | Freq: Three times a day (TID) | ORAL | Status: DC

## 2016-02-28 MED ORDER — HEPARIN SODIUM (PORCINE) 5000 UNIT/ML IJ SOLN: 4000.0000 [IU] | Freq: Once | INTRAMUSCULAR | Status: DC

## 2016-02-28 MED ORDER — ATROPINE SULFATE 0.4 MG/ML IJ SOLN
0.4000 mg | Freq: Once | INTRAMUSCULAR | Status: AC
  Filled 2016-02-28: qty 1

## 2016-02-28 MED ORDER — PANTOPRAZOLE SODIUM 40 MG PO TBEC
40.0000 mg | DELAYED_RELEASE_TABLET | Freq: Every day | ORAL | Status: DC
  Administered 2016-02-28 – 2016-03-05 (×7): 40 mg via ORAL
  Filled 2016-02-28 (×7): qty 1

## 2016-02-28 MED ORDER — ACETAMINOPHEN 325 MG PO TABS
650.0000 mg | ORAL_TABLET | Freq: Four times a day (QID) | ORAL | Status: DC | PRN
  Administered 2016-02-28 – 2016-03-04 (×4): 650 mg via ORAL
  Filled 2016-02-28 (×4): qty 2

## 2016-02-28 MED ORDER — NICARDIPINE HCL IN NACL 20-0.86 MG/200ML-% IV SOLN
3.0000 mg/h | INTRAVENOUS | Status: DC
  Administered 2016-02-28: 5 mg/h via INTRAVENOUS
  Administered 2016-02-28: 1 mg/h via INTRAVENOUS
  Administered 2016-02-28: 5 mg/h via INTRAVENOUS
  Administered 2016-02-29: 3 mg/h via INTRAVENOUS
  Filled 2016-02-28 (×3): qty 200

## 2016-02-28 MED ORDER — ATROPINE SULFATE 1 MG/ML IJ SOLN
1.0000 mg | INTRAMUSCULAR | Status: AC
  Filled 2016-02-28: qty 1

## 2016-02-28 MED ORDER — HEPARIN (PORCINE) IN NACL 100-0.45 UNIT/ML-% IJ SOLN
1200.0000 [IU]/h | INTRAMUSCULAR | Status: DC
  Administered 2016-02-28: 1200 [IU]/h via INTRAVENOUS
  Administered 2016-02-28: 1100 [IU]/h via INTRAVENOUS
  Administered 2016-02-29 – 2016-03-01 (×2): 1200 [IU]/h via INTRAVENOUS
  Filled 2016-02-28 (×4): qty 250

## 2016-02-28 MED ORDER — LABETALOL HCL 5 MG/ML IV SOLN
20.0000 mg | Freq: Once | INTRAVENOUS | Status: AC
  Administered 2016-02-28: 20 mg via INTRAVENOUS

## 2016-02-28 MED ORDER — HEPARIN BOLUS VIA INFUSION
3500.0000 [IU] | Freq: Once | INTRAVENOUS | Status: AC
  Administered 2016-02-28: 3500 [IU] via INTRAVENOUS
  Filled 2016-02-28: qty 3500

## 2016-02-28 MED ORDER — ONDANSETRON HCL 4 MG PO TABS: 4.0000 mg | ORAL_TABLET | Freq: Four times a day (QID) | ORAL | Status: DC | PRN

## 2016-02-28 MED ORDER — INSULIN ASPART 100 UNIT/ML ~~LOC~~ SOLN
0.0000 [IU] | Freq: Three times a day (TID) | SUBCUTANEOUS | Status: DC
  Administered 2016-02-28: 3 [IU] via SUBCUTANEOUS
  Administered 2016-02-28: 1 [IU] via SUBCUTANEOUS
  Administered 2016-02-28: 3 [IU] via SUBCUTANEOUS
  Administered 2016-02-28: 7 [IU] via SUBCUTANEOUS
  Administered 2016-02-29: 9 [IU] via SUBCUTANEOUS
  Administered 2016-02-29: 2 [IU] via SUBCUTANEOUS
  Administered 2016-02-29 (×2): 3 [IU] via SUBCUTANEOUS
  Administered 2016-03-01: 5 [IU] via SUBCUTANEOUS
  Administered 2016-03-01: 7 [IU] via SUBCUTANEOUS
  Filled 2016-02-28 (×2): qty 7
  Filled 2016-02-28 (×2): qty 3
  Filled 2016-02-28: qty 1
  Filled 2016-02-28 (×2): qty 7
  Filled 2016-02-28: qty 9
  Filled 2016-02-28: qty 3
  Filled 2016-02-28: qty 2
  Filled 2016-02-28: qty 3

## 2016-02-28 MED ORDER — OXYCODONE HCL 5 MG PO TABS
5.0000 mg | ORAL_TABLET | ORAL | Status: DC | PRN
  Administered 2016-02-28: 5 mg via ORAL
  Filled 2016-02-28: qty 1

## 2016-02-28 MED ORDER — ATROPINE SULFATE 1 MG/10ML IJ SOSY
PREFILLED_SYRINGE | INTRAMUSCULAR | Status: AC
  Administered 2016-02-28: 1 mg
  Filled 2016-02-28: qty 10

## 2016-02-28 MED ORDER — DILTIAZEM HCL ER COATED BEADS 180 MG PO CP24
180.0000 mg | ORAL_CAPSULE | Freq: Two times a day (BID) | ORAL | Status: DC
  Administered 2016-02-28: 180 mg via ORAL
  Filled 2016-02-28: qty 1

## 2016-02-28 MED ORDER — ONDANSETRON HCL 4 MG/2ML IJ SOLN
4.0000 mg | Freq: Four times a day (QID) | INTRAMUSCULAR | Status: DC | PRN
  Administered 2016-02-28 – 2016-02-29 (×2): 4 mg via INTRAVENOUS
  Filled 2016-02-28 (×3): qty 2

## 2016-02-28 MED ORDER — ATROPINE SULFATE 1 MG/10ML IJ SOSY
PREFILLED_SYRINGE | INTRAMUSCULAR | Status: AC
  Administered 2016-02-28: 0.4 mg
  Filled 2016-02-28: qty 10

## 2016-02-28 NOTE — Consult Note (Signed)
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 were called by telephone at the time of interpretation on 02/28/2016 at 12:26 am to Dr. Manson PasseyBrown, who verbally acknowledged these results. Electronically Signed   By: Jasmine PangKim  Fujinaga M.D.   On: 02/28/2016 00:26      Assessment/Plan 1. Acute on subacute renal infarction: I have personally reviewed the CT scan. The patient has a very large thrombus within the left atrium and given her history of paroxysmal atrial fibrillation this is the source of her embolization. On my review of the scan there is trivial soft plaque associated with patchy areas of calcified plaque within the thoracic and abdominal aorta. There is no significant thrombus within the aorta this is not an issue with this current clinical situation. Also on my review of the CT scan I do agree she has 70-80% ostial left renal artery stenosis. This greatly complicates the situation given by my estimation she has approximately 50% of her left renal mass left functioning and now will be a very challenging time to expose her to nephrotoxic medications. Nevertheless, clearly normalization of blood flow to her remaining functioning kidney is imperative if she is to maintain any  remnant of kidney function. Based on this my plan will be to observe her kidney function over the next 48-72 hours. If she remains within normal bounds then consideration for angiography and left renal artery stenting on Friday can be entertained. If her creatinine continues to rise then no intervention will be performed until a better understanding of the exact extent of her kidney damage is ascertained. I concur with heparin as anticoagulation is paramount in preventing further episodes of embolization. I will also ask nephrology to see the patient given the severe hypertension that she is now experiencing as well as her decreased urine output.  2. 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 I believe that her celiac artery stenosis and is unlikely to cause profound abdominal complaints and given that she does not appear to have similar difficulties on this admission as well as the damage  to her renal function no plans regarding treatment of her celiac artery stenosis will be made at this time. 4.  Atrial fibrillation: I have again stressed to the patient the importance of anticoagulation in the prevention of embolic episodes that could eliminate the remaining small amount of her renal function, cause strokes and even cause limb loss this was all reviewed in detail with the patient. 5.  Hypertension: This will be a very challenging aspect to her care given the extensive damage to her kidneys controlling her hypertension will be very difficult and I agree with parenteral antihypertensives and continuing care within the ICU.  I spent over 80 minutes with this patient more than half of which was in discussion regarding the need for anticoagulation the cause of her kidney damage atrial fibrillation the thrombus within her heart as well as the renal artery stenosis and the difficulties in timing the treatment of  this lesion given her damage to kidneys and exposure contrast. All of her questions were answered. Of note during this long discussion she did make claim that a daughter who lives in [place] state has power of attorney I am unaware as to whether this is valid I do not believe that we have a copy of her power of attorney on the chart but I have asked that we investigate this tomorrow.  Levora Dredge, MD  02/28/2016 7:52 PM    This note was created with Dragon medical transcription system.  Any error is purely unintentional

## 2016-02-28 NOTE — Progress Notes (Signed)
ANTICOAGULATION CONSULT NOTE - Initial Consult  Pharmacy Consult for heparin Indication: Renal infarct   No Known Allergies  Patient Measurements: Height: 5\' 4"  (162.6 cm) Weight: 158 lb (71.7 kg) IBW/kg (Calculated) : 54.7 Heparin Dosing Weight: 69.4 kg  Vital Signs: Temp: 96.4 F (35.8 C) (12/19 1248) Temp Source: Axillary (12/19 1248) BP: 162/68 (12/19 1500) Pulse Rate: 67 (12/19 1500)  Labs:  Recent Labs  02/27/16 2205 02/28/16 0115 02/28/16 0546 02/28/16 0844 02/28/16 1148 02/28/16 1441  HGB 12.1  --   --  11.1*  --   --   HCT 37.2  --   --  33.3*  --   --   PLT 357  --   --  325  --   --   APTT  --  31  --  92*  --  54*  LABPROT  --  13.8  --   --   --   --   INR  --  1.06  --   --   --   --   HEPARINUNFRC  --  <0.10*  --   --   --  0.52  CREATININE 1.12*  --   --  1.30*  --   --   TROPONINI 0.03* 0.03* 0.03*  --  <0.03  --     Estimated Creatinine Clearance: 36.3 mL/min (by C-G formula based on SCr of 1.3 mg/dL (H)).   Medical History: Past Medical History:  Diagnosis Date  . Afib (HCC)   . Anxiety   . Asthma   . CHF (congestive heart failure) (HCC)   . Coronary artery disease   . Diabetes mellitus without complication (HCC)   . Hyperlipidemia   . Hypertension     Medications:  Infusions:  . heparin 1,100 Units/hr (02/28/16 1412)  . niCARDipine 5 mg/hr (02/28/16 1454)    Assessment: 75 yof cc hypertension presents to ED after recent hospital visit for renal thromboembolism/infarct during which time she started Xarelto. Baseline anti-Xa level < 0.1 suggestive of noncompliance. Heparin currently infusing at 1100 units/hr.   Goal of Therapy:  aPTT 66 to 102 seconds Monitor platelets by anticoagulation protocol: Yes   Plan:  Anti-Xa level and aPTT currently not correlating. Will increase heparin to 1200 units/hr and obtain follow-up anti-Xa level and aPTT at 2330. Will manage via anti-Xa levels once levels correlate.    Pharmacy will  continue to monitor and adjust per consult.    Simpson,Michael L 02/28/2016,3:31 PM

## 2016-02-28 NOTE — H&P (Signed)
[facility] [place] [facility]   PATIENT NAME: Robin [name]    MR#:  [id number]  DATE OF BIRTH:  [birth year]  DATE OF ADMISSION:  02/27/2016  PRIMARY CARE PHYSICIAN: Leanna Sato, MD   REQUESTING/REFERRING PHYSICIAN: Manson Passey, MD  CHIEF COMPLAINT:   Chief Complaint  Patient presents with  . Hypertension    HISTORY OF PRESENT ILLNESS:  Robin [name]  is a 75 y.o. female who presents with Severely elevated blood pressure as well as abdominal pain radiating to her back. Patient was supposed to be on Xarelto but states that she has not been taking it. She is unsure why she was supposed to be on Xarelto, she does have a history of paroxysmal A. fib. She did have a CT scan about a month ago which did not seem to show a thrombus seen on CT scan tonight, however tonight scan was a CTA. The previous scan did show renal artery stenosis and possible renal infarct. Denied scan shows aortic thrombus, possible thrombus in the atrial appendage, new left renal infarcts, persistent renal artery stenosis. Patient's blood pressure severely elevated and is not responding well to medications given in the ED. She has cardiac history including bypass surgery in the past, she does state that she has recently been having wheezing with no significant prior diagnosis of COPD per her account. Troponin is very mildly elevated today. Hospitalists were called for admission.  PAST MEDICAL HISTORY:   Past Medical History:  Diagnosis Date  . Afib (HCC)   . Anxiety   . Asthma   . CHF (congestive heart failure) (HCC)   . Coronary artery disease   . Diabetes mellitus without complication (HCC)   . Hyperlipidemia   . Hypertension     PAST SURGICAL HISTORY:   Past Surgical History:  Procedure Laterality Date  . CORONARY ARTERY BYPASS GRAFT    . NO PAST SURGERIES      SOCIAL HISTORY:   Social History  Substance Use Topics  . Smoking status: Never Smoker  . Smokeless tobacco:  Never Used  . Alcohol use No    FAMILY HISTORY:   Family History  Problem Relation Age of Onset  . Hypertension Mother   . Heart failure Mother   . Heart disease Father     DRUG ALLERGIES:  No Known Allergies  MEDICATIONS AT HOME:   Prior to Admission medications   Medication Sig Start Date End Date Taking? Authorizing Provider  albuterol (ACCUNEB) 1.25 MG/3ML nebulizer solution Take 1.25 mg by nebulization every 4 (four) hours as needed for wheezing or shortness of breath.     Historical Provider, MD  diltiazem (CARDIZEM CD) 180 MG 24 hr capsule Take 1 capsule (180 mg total) by mouth 2 (two) times daily. 02/10/16   Altamese Dilling, MD  meclizine (ANTIVERT) 25 MG tablet Take 1 tablet (25 mg total) by mouth 3 (three) times daily as needed for dizziness. 11/06/15   Enid Baas, MD  metoprolol (LOPRESSOR) 50 MG tablet Take 1 tablet (50 mg total) by mouth 3 (three) times daily. 02/10/16   Altamese Dilling, MD  nitroGLYCERIN (NITROSTAT) 0.4 MG SL tablet Place 1 tablet (0.4 mg total) under the tongue every 5 (five) minutes as needed for chest pain. 01/25/16   Adrian Saran, MD  ondansetron (ZOFRAN ODT) 4 MG disintegrating tablet Take 1 tablet (4 mg total) by mouth every 8 (eight) hours as needed for nausea or vomiting. 11/06/15   Enid Baas, MD  pantoprazole (PROTONIX) 40  MG tablet Take 1 tablet (40 mg total) by mouth daily. Patient taking differently: Take 40 mg by mouth daily as needed. For acid reflux 11/06/15   Enid Baas, MD  Rivaroxaban (XARELTO) 15 MG TABS tablet Take 1 tablet (15 mg total) by mouth 2 (two) times daily with a meal. 02/11/16 03/03/16  Altamese Dilling, MD  rivaroxaban (XARELTO) 20 MG TABS tablet Take 1 tablet (20 mg total) by mouth daily with supper. 03/03/16   Altamese Dilling, MD  traMADol (ULTRAM) 50 MG tablet Take 1 tablet (50 mg total) by mouth every 12 (twelve) hours as needed for moderate pain or severe pain. 02/10/16   Altamese Dilling, MD    REVIEW OF SYSTEMS:  Review of Systems  Constitutional: Negative for chills, fever, malaise/fatigue and weight loss.  HENT: Negative for ear pain, hearing loss and tinnitus.   Eyes: Negative for blurred vision, double vision, pain and redness.  Respiratory: Positive for wheezing (intermittent). Negative for cough, hemoptysis and shortness of breath.   Cardiovascular: Negative for chest pain, palpitations, orthopnea and leg swelling.  Gastrointestinal: Positive for abdominal pain. Negative for constipation, diarrhea, nausea and vomiting.  Genitourinary: Negative for dysuria, frequency and hematuria.  Musculoskeletal: Positive for back pain. Negative for joint pain and neck pain.  Skin:       No acne, rash, or lesions  Neurological: Negative for dizziness, tremors, focal weakness and weakness.  Endo/Heme/Allergies: Negative for polydipsia. Does not bruise/bleed easily.  Psychiatric/Behavioral: Negative for depression. The patient is not nervous/anxious and does not have insomnia.      VITAL SIGNS:   Vitals:   02/27/16 2140 02/27/16 2230 02/27/16 2356 02/28/16 0022  BP: (!) 220/84 (!) 217/66 (!) 185/71 (!) 228/71  Pulse: 78 68 73 76  Resp: 20 (!) 21 20 (!) 22  Temp: 97.7 F (36.5 C)     TempSrc: Oral     SpO2: 95% 92% 94% 97%  Weight:      Height:       Wt Readings from Last 3 Encounters:  02/27/16 71.7 kg (158 lb)  02/17/16 71.7 kg (158 lb)  02/06/16 71.8 kg (158 lb 3.2 oz)    PHYSICAL EXAMINATION:  Physical Exam  Vitals reviewed. Constitutional: She is oriented to person, place, and time. She appears well-developed and well-nourished. No distress.  HENT:  Head: Normocephalic and atraumatic.  Mouth/Throat: Oropharynx is clear and moist.  Eyes: Conjunctivae and EOM are normal. Pupils are equal, round, and reactive to light. No scleral icterus.  Neck: Normal range of motion. Neck supple. No JVD present. No thyromegaly present.  Cardiovascular: Normal rate,  regular rhythm and intact distal pulses.  Exam reveals no gallop and no friction rub.   No murmur heard. Respiratory: Effort normal and breath sounds normal. No respiratory distress. She has no wheezes. She has no rales.  GI: Soft. Bowel sounds are normal. She exhibits no distension. There is tenderness.  Musculoskeletal: Normal range of motion. She exhibits no edema.  No arthritis, no gout  Lymphadenopathy:    She has no cervical adenopathy.  Neurological: She is alert and oriented to person, place, and time. No cranial nerve deficit.  No dysarthria, no aphasia  Skin: Skin is warm and dry. No rash noted. No erythema.  Psychiatric: She has a normal mood and affect. Her behavior is normal. Judgment and thought content normal.    LABORATORY PANEL:   CBC  Recent Labs Lab 02/27/16 2205  WBC 13.7*  HGB 12.1  HCT 37.2  PLT 357   ------------------------------------------------------------------------------------------------------------------  Chemistries   Recent Labs Lab 02/27/16 2205  NA 137  K 4.1  CL 102  CO2 25  GLUCOSE 259*  BUN 23*  CREATININE 1.12*  CALCIUM 8.8*  AST 22  ALT 16  ALKPHOS 114  BILITOT 0.5   ------------------------------------------------------------------------------------------------------------------  Cardiac Enzymes  Recent Labs Lab 02/27/16 2205  TROPONINI 0.03*   ------------------------------------------------------------------------------------------------------------------  RADIOLOGY:  Ct Angio Chest Aorta W And/or Wo Contrast  Result Date: 02/28/2016 CLINICAL DATA:  Severe epigastric pain radiating to the back with nausea she has a history of renal artery thrombosis EXAM: CT ANGIOGRAPHY CHEST, ABDOMEN AND PELVIS TECHNIQUE: Multidetector CT imaging through the chest, abdomen and pelvis was performed using the standard protocol during bolus administration of intravenous contrast. Multiplanar reconstructed images and MIPs were  obtained and reviewed to evaluate the vascular anatomy. CONTRAST:  100 mL Isovue 370 intravenous COMPARISON:  CT abdomen 02/06/2016 FINDINGS: CTA CHEST FINDINGS Cardiovascular: There is no dissection involving the ascending or descending thoracic aorta. There is common origin of the left common carotid artery and the right brachiocephalic artery. Left subclavian artery has its takeoff from the distal arch which is left-sided. There is atherosclerosis of the aorta. There are post CABG changes present. Central pulmonary arteries demonstrate no filling defects. There is coronary artery calcification. There is an oval filling defect within the left atrial appendage, series 6, image number 38 which is suspicious for a thrombus. The heart is slightly enlarged. There is no significant pericardial effusion. Mediastinum/Nodes: There are mildly enlarged mediastinal lymph nodes. A node anterior to the aortic arch measures 1.3 cm. A right paratracheal lymph node measures 1.4 cm. No hilar adenopathy. Imaged thyroid gland within normal limits. Trachea is midline. Esophagus grossly unremarkable. Lungs/Pleura: Hazy attenuation is present bilaterally, which may represent diffuse atelectasis or mild edema, there is suggestion of mild septal thickening. There are small bilateral pleural effusions present. There is no acute consolidation. Musculoskeletal: There are degenerative changes. Post sternotomy changes. No acute osseous abnormality. Review of the MIP images confirms the above findings. CTA ABDOMEN AND PELVIS FINDINGS VASCULAR Aorta: Atherosclerosis and mural thrombus is present within the abdominal aorta. No discrete aneurysm is visualized. There is a small linear density within the distal abdominal aorta just above the bifurcation which may represent a small intimal flap, series 6, image number 124. 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 SMA: Calcification and plaque are present at the origin of  the SMA with mild, less than 50% stenosis suggested. Renals: The right renal artery is occluded at its origin. Moderate severe stenosis suggested at the origin of the left renal artery. No definitive thrombus is visualized within the renal artery. IMA: Calcifications and thrombus present at the origin of the IMA, IMA is patent after the origin. Inflow: Moderate atherosclerotic calcification and mural thrombus of the distal aorta. Scattered atherosclerotic calcification within the right external iliac artery. Severe stenosis of the proximal SFA on the right with eventual occlusion of the proximal to mid superficial femoral artery. Mild atherosclerotic calcification of the left external iliac artery. Mild calcification at the left common femoral artery. There is patency of the left superficial femoral artery proximally. Veins: Suboptimally evaluated on arterial study Review of the MIP images confirms the above findings. NON-VASCULAR Hepatobiliary: There is no focal hepatic abnormality visualized. No calcified gallstones. No biliary dilatation. Pancreas: Unremarkable. No pancreatic ductal dilatation or surrounding inflammatory changes. Spleen: Normal in size without focal abnormality. Adrenals/Urinary Tract:  The adrenal glands are within normal limits. Again visualized is hypoperfusion of the right kidney consistent with infarction, renal size has decreased compared to the prior study. Now seen is wedge-shaped hypodensity within the left kidney consistent with left renal infarction. The urinary bladder is unremarkable. Stomach/Bowel: Stomach is nonenlarged. There is no dilated small bowel. No colon wall thickening. There is diverticular disease without acute inflammation. Lymphatic: No significantly enlarged lymph nodes. Reproductive: Probable partially calcified uterine fibroid. No adnexal mass. Other: Trace free fluid in the pelvis.  No free air. 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 Review of the MIP images confirms the above findings. IMPRESSION: 1. No evidence for acute dissection involving the ascending or descending thoracic aorta. Suspect short segment focal dissection involving the distal abdominal aorta just above the bifurcation. 2. Oval filling defect within the left atrial appendage is suspicious for thrombus. Suggest correlation with echocardiography. 3. Hypoperfused right kidney with occluded right renal artery. Right kidney has decreased in size compared to the prior CT scan. New wedge-shaped peripheral hypodensities in the left kidney, consistent with acute left renal infarctions. There is severe stenosis at the origin of the left renal artery. 4. 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 5. 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 Severe stenosis/occlusion of the imaged portions of the right proximal superficial femoral artery. Critical Value/emergent results were called by telephone at the time of interpretation on 02/28/2016 at 12:26 am to Dr. Manson PasseyBrown, who verbally acknowledged these results. Electronically Signed   By: Jasmine PangKim  Fujinaga M.D.   On: 02/28/2016 00:26   Ct Angio Abd/pel W And/or Wo Contrast  Result Date: 02/28/2016 CLINICAL DATA:  Severe epigastric pain radiating to the back with nausea she has a history of renal artery thrombosis EXAM: CT ANGIOGRAPHY CHEST, ABDOMEN AND PELVIS TECHNIQUE: Multidetector CT imaging through the chest, abdomen and pelvis was performed using the standard protocol during bolus administration of intravenous contrast. Multiplanar reconstructed images and MIPs were obtained and reviewed to evaluate the vascular anatomy. CONTRAST:  100 mL Isovue 370 intravenous COMPARISON:  CT abdomen 02/06/2016 FINDINGS: CTA CHEST FINDINGS Cardiovascular: There is no dissection involving the ascending or descending thoracic aorta. There is common origin of the left common carotid artery and the right  brachiocephalic artery. Left subclavian artery has its takeoff from the distal arch which is left-sided. There is atherosclerosis of the aorta. There are post CABG changes present. Central pulmonary arteries demonstrate no filling defects. There is coronary artery calcification. There is an oval filling defect within the left atrial appendage, series 6, image number 38 which is suspicious for a thrombus. The heart is slightly enlarged. There is no significant pericardial effusion. Mediastinum/Nodes: There are mildly enlarged mediastinal lymph nodes. A node anterior to the aortic arch measures 1.3 cm. A right paratracheal lymph node measures 1.4 cm. No hilar adenopathy. Imaged thyroid gland within normal limits. Trachea is midline. Esophagus grossly unremarkable. Lungs/Pleura: Hazy attenuation is present bilaterally, which may represent diffuse atelectasis or mild edema, there is suggestion of mild septal thickening. There are small bilateral pleural effusions present. There is no acute consolidation. Musculoskeletal: There are degenerative changes. Post sternotomy changes. No acute osseous abnormality. Review of the MIP images confirms the above findings. CTA ABDOMEN AND PELVIS FINDINGS VASCULAR Aorta: Atherosclerosis and mural thrombus is present within the abdominal aorta. No discrete aneurysm is visualized. There is a small linear density within the distal abdominal aorta just above the bifurcation  which may represent a small intimal flap, series 6, image number 124. 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 SMA: Calcification and plaque are present at the origin of the SMA with mild, less than 50% stenosis suggested. Renals: The right renal artery is occluded at its origin. Moderate severe stenosis suggested at the origin of the left renal artery. No definitive thrombus is visualized within the renal artery. IMA: Calcifications and thrombus present at the origin of the IMA, IMA is  patent after the origin. Inflow: Moderate atherosclerotic calcification and mural thrombus of the distal aorta. Scattered atherosclerotic calcification within the right external iliac artery. Severe stenosis of the proximal SFA on the right with eventual occlusion of the proximal to mid superficial femoral artery. Mild atherosclerotic calcification of the left external iliac artery. Mild calcification at the left common femoral artery. There is patency of the left superficial femoral artery proximally. Veins: Suboptimally evaluated on arterial study Review of the MIP images confirms the above findings. NON-VASCULAR Hepatobiliary: There is no focal hepatic abnormality visualized. No calcified gallstones. No biliary dilatation. Pancreas: Unremarkable. No pancreatic ductal dilatation or surrounding inflammatory changes. Spleen: Normal in size without focal abnormality. Adrenals/Urinary Tract: The adrenal glands are within normal limits. Again visualized is hypoperfusion of the right kidney consistent with infarction, renal size has decreased compared to the prior study. Now seen is wedge-shaped hypodensity within the left kidney consistent with left renal infarction. The urinary bladder is unremarkable. Stomach/Bowel: Stomach is nonenlarged. There is no dilated small bowel. No colon wall thickening. There is diverticular disease without acute inflammation. Lymphatic: No significantly enlarged lymph nodes. Reproductive: Probable partially calcified uterine fibroid. No adnexal mass. Other: Trace free fluid in the pelvis.  No free air. 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 Review of the MIP images confirms the above findings. IMPRESSION: 1. No evidence for acute dissection involving the ascending or descending thoracic aorta. Suspect short segment focal dissection involving the distal abdominal aorta just above the bifurcation. 2. Oval filling defect within the left  atrial appendage is suspicious for thrombus. Suggest correlation with echocardiography. 3. Hypoperfused right kidney with occluded right renal artery. Right kidney has decreased in size compared to the prior CT scan. New wedge-shaped peripheral hypodensities in the left kidney, consistent with acute left renal infarctions. There is severe stenosis at the origin of the left renal artery. 4. 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 5. 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 Severe stenosis/occlusion of the imaged portions of the right proximal superficial femoral artery. Critical Value/emergent results were called by telephone at the time of interpretation on 02/28/2016 at 12:26 am to Dr. Manson Passey, who verbally acknowledged these results. Electronically Signed   By: Jasmine Pang M.D.   On: 02/28/2016 00:26    EKG:   Orders placed or performed during the hospital encounter of 02/27/16  . ED EKG  . ED EKG  . EKG 12-Lead  . 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 Patient will be started on heparin drip tonight. Vascular surgery will be consulted in regards to this problem and also her renal artery stenosis. Active Problems:   Aortic mural thrombus (HCC) - anticoagulation started as above, vascular surgery consult as above.   Renal artery stenosis (HCC) - likely contributing to her significantly elevated blood pressure, we  will have her on a nicardipine drip to try and get her pressure down tonight, vascular surgery consult as above   Accelerated hypertension - nicardipine drip as the patient's BP has not been responsive to multiple doses of IV antihypertensives   CAD (coronary artery disease) - continue home meds, trend cardiac enzymes   Chronic systolic CHF (congestive heart failure) (HCC) - continue home meds, get echocardiogram in the morning as the patient's symptoms of  wheezing could potentially be related to mild exacerbation of her heart failure in the setting of extremely elevated blood pressure   Diabetes (HCC) - sliding scale insulin with corresponding glucose checks   A-fib (HCC) - continue home meds   HLD (hyperlipidemia) - home dose anti-lipids  All the records are reviewed and case discussed with ED provider. Management plans discussed with the patient and/or family.  DVT PROPHYLAXIS: Systemic anticoagulation  GI PROPHYLAXIS: PPI  ADMISSION STATUS: Inpatient  CODE STATUS: Full Code Status History    Date Active Date Inactive Code Status Order ID Comments User Context   02/06/2016  7:17 PM 02/10/2016 11:14 PM Full Code [id number]  Wyatt Haste, MD ED   01/24/2016  2:00 PM 01/25/2016  7:15 PM Full Code [id number]  Auburn Bilberry, MD Inpatient   11/25/2015  3:40 PM 11/26/2015  3:49 PM Full Code [id number]  Houston Siren, MD Inpatient   11/05/2015 12:33 AM 11/06/2015  6:05 PM Full Code [id number]  Oralia Manis, MD Inpatient   09/13/2015 10:10 AM 09/15/2015  7:33 PM Full Code [id number]  Altamese Dilling, MD Inpatient      TOTAL TIME TAKING CARE OF THIS PATIENT: 45 minutes.    Liliann File FIELDING 02/28/2016, 12:54 AM  [facility]  Office  [phone]  CC: Primary care physician; Leanna Sato, MD
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
## 2016-02-28 NOTE — Progress Notes (Signed)
CH responded to a page to visit a Pt in ICU [id number], who wanted to speak to a Ch. Ch visited the Pt. Pt felt anxious because the Dc gave her the bad news. She was told that she is living on a half kidney and was expected to live for 6 to 1 years. She was optimistic that God is able keep her around for along time. She talked with [facility] for a while before asking for prayers, which [facility] provide with a ministry of presence. Note: The patient would benefit from a pastoral follow-up.     02/28/16 2200  Clinical Encounter Type  Visited With Patient  Visit Type Initial  Referral From Nurse  Consult/Referral To Chaplain  Spiritual Encounters  Spiritual Needs Prayer;Emotional

## 2016-02-28 NOTE — Consult Note (Signed)
[facility] Consult   Reason for Consult:  Consult for patient with recurrent blood clots and medical problems currently in the ICU. Referring Physician:  Sudini Patient Identification: Hebah Bogosian MRN:  [id number] Principal Diagnosis: Renal infarct [facility]) Diagnosis:   Patient Active Problem List   Diagnosis Date Noted  . Aortic mural thrombus ([place]) [I74.10] 02/28/2016  . Renal artery stenosis ([place]) [I70.1] 02/28/2016  . Accelerated hypertension [I10] 02/28/2016  . Mild dementia [F03.90] 02/28/2016  . Noncompliance [Z91.19] 02/07/2016  . Renal infarct ([place]) [N28.0] 02/06/2016  . Atrial fibrillation with rapid ventricular response ([place]) [I48.91] 02/06/2016  . A-fib ([place]) [I48.91] 01/24/2016  . HTN (hypertension) [I10] 11/04/2015  . HLD (hyperlipidemia) [E78.5] 11/04/2015  . CAD (coronary artery disease) [I25.10] 11/04/2015  . Chronic systolic CHF (congestive heart failure) ([place]) [I50.22] 11/04/2015  . Diabetes ([place]) [E11.9] 11/04/2015    Total Time spent with patient: 1 hour  Subjective:   Robin [name] is a 75 y.o. female patient admitted with "I have A. fib".  HPI:  Patient seen. Chart reviewed. Spoke with nursing. Also spoke a little bit with her female friend who was visiting. 75 year old woman with a history of coagulation problems and multiple hospitalizations now back in the hospital again intensive care unit with blood clots. On interview today the patient knows that she is in the hospital. She does not know the correct month that she knows the year. She was able to repeat 3 words immediately but could not remember any of them at 3 minutes. Patient was not able to tell me why she was in the hospital. When I ask her to describe her medical problems she told me repeatedly that she had "A. fib". When I ask her to describe what A. fib was she was not able to do so. Couldn't tell me what part of her body was affected by it. Didn't remember any of the medicines that  she was taking. Claims that she simply takes whatever her friend gives to her." Who apparently lives with her says that he just gives her whatever she gets from rehabilitation. She hasn't gotten anything refills since she got out of rehabilitation. Patient doesn't report any depression doesn't report any psychosis denies any suicidal ideation.  Social history: Lives with a female friend. Has a daughter who apparently is occasionally involved but not living at home.  Medical history: Does have atrial fibrillation and is supposed to be on anticoagulants but has repeatedly comment to the hospital with blood clots and evidence of not taking her medicine. Also diabetes hypertension dyslipidemia coronary artery disease  Substance abuse history: None  Past Psychiatric History: I saw this patient last month for what I think was probably a similar question. At that time I saw her near the end of her hospitalization and she was a little more lucid. I don't know whether she's really gotten more demented since then her of I'm just seeing her in a sicker phase. Patient doesn't have any other past psychiatric history. No psychiatric hospitalizations no suicide attempts no diagnosis of any mental health problems.  Risk to Self: Is patient at risk for suicide?: No Risk to Others:   Prior Inpatient Therapy:   Prior Outpatient Therapy:    Past Medical History:  Past Medical History:  Diagnosis Date  . Afib ([address])   . Anxiety   . Asthma   . CHF (congestive heart failure) ([place])   . Coronary artery disease   . Diabetes mellitus without complication ([place])   .  Hyperlipidemia   . Hypertension     Past Surgical History:  Procedure Laterality Date  . CORONARY ARTERY BYPASS GRAFT    . NO PAST SURGERIES     Family History:  Family History  Problem Relation Age of Onset  . Hypertension Mother   . Heart failure Mother   . Heart disease Father    Family Psychiatric  History: None identified Social History:   History  Alcohol Use No     History  Drug Use No    Social History   Social History  . Marital status: Single    Spouse name: N/A  . Number of children: N/A  . Years of education: N/A   Social History Main Topics  . Smoking status: Never Smoker  . Smokeless tobacco: Never Used  . Alcohol use No  . Drug use: No  . Sexual activity: No   Other Topics Concern  . None   Social History Narrative  . None   Additional Social History:    Allergies:  No Known Allergies  Labs:  Results for orders placed or performed during the hospital encounter of 02/27/16 (from the past 48 hour(s))  Comprehensive metabolic panel     Status: Abnormal   Collection Time: 02/27/16 10:05 PM  Result Value Ref Range   Sodium 137 135 - 145 mmol/L   Potassium 4.1 3.5 - 5.1 mmol/L   Chloride 102 101 - 111 mmol/L   CO2 25 22 - 32 mmol/L   Glucose, Bld 259 (H) 65 - 99 mg/dL   BUN 23 (H) 6 - 20 mg/dL   Creatinine, Ser 1.12 (H) 0.44 - 1.00 mg/dL   Calcium 8.8 (L) 8.9 - 10.3 mg/dL   Total Protein 7.6 6.5 - 8.1 g/dL   Albumin 3.6 3.5 - 5.0 g/dL   AST 22 15 - 41 U/L   ALT 16 14 - 54 U/L   Alkaline Phosphatase 114 38 - 126 U/L   Total Bilirubin 0.5 0.3 - 1.2 mg/dL   GFR calc non Af Amer 47 (L) >60 mL/min   GFR calc Af Amer 54 (L) >60 mL/min    Comment: (NOTE) The eGFR has been calculated using the CKD EPI equation. This calculation has not been validated in all clinical situations. eGFR's persistently <60 mL/min signify possible Chronic Kidney Disease.    Anion gap 10 5 - 15  Lipase, blood     Status: None   Collection Time: 02/27/16 10:05 PM  Result Value Ref Range   Lipase 13 11 - 51 U/L  CBC with Differential     Status: Abnormal   Collection Time: 02/27/16 10:05 PM  Result Value Ref Range   WBC 13.7 (H) 3.6 - 11.0 K/uL   RBC 4.50 3.80 - 5.20 MIL/uL   Hemoglobin 12.1 12.0 - 16.0 g/dL   HCT 37.2 35.0 - 47.0 %   MCV 82.8 80.0 - 100.0 fL   MCH 26.9 26.0 - 34.0 pg   MCHC 32.5 32.0 -  36.0 g/dL   RDW 14.1 11.5 - 14.5 %   Platelets 357 150 - 440 K/uL   Neutrophils Relative % 84 %   Neutro Abs 11.4 (H) 1.4 - 6.5 K/uL   Lymphocytes Relative 10 %   Lymphs Abs 1.4 1.0 - 3.6 K/uL   Monocytes Relative 5 %   Monocytes Absolute 0.7 0.2 - 0.9 K/uL   Eosinophils Relative 0 %   Eosinophils Absolute 0.0 0 - 0.7 K/uL   Basophils Relative 1 %  Basophils Absolute 0.2 (H) 0 - 0.1 K/uL  Troponin I     Status: Abnormal   Collection Time: 02/27/16 10:05 PM  Result Value Ref Range   Troponin I 0.03 (HH) <0.03 ng/mL    Comment: CRITICAL RESULT CALLED TO, READ BACK BY AND VERIFIED WITH CALLED BUTCH WOODS AT 2303 ON 02/27/16 BY SNJ   Urinalysis, Complete w Microscopic     Status: Abnormal   Collection Time: 02/27/16 10:50 PM  Result Value Ref Range   Color, Urine YELLOW (A) YELLOW   APPearance CLEAR (A) CLEAR   Specific Gravity, Urine 1.014 1.005 - 1.030   pH 7.0 5.0 - 8.0   Glucose, UA >=500 (A) NEGATIVE mg/dL   Hgb urine dipstick NEGATIVE NEGATIVE   Bilirubin Urine NEGATIVE NEGATIVE   Ketones, ur 5 (A) NEGATIVE mg/dL   Protein, ur >=300 (A) NEGATIVE mg/dL   Nitrite NEGATIVE NEGATIVE   Leukocytes, UA NEGATIVE NEGATIVE   RBC / HPF 0-5 0 - 5 RBC/hpf   WBC, UA 0-5 0 - 5 WBC/hpf   Bacteria, UA RARE (A) NONE SEEN   Squamous Epithelial / LPF 0-5 (A) NONE SEEN  Troponin I     Status: Abnormal   Collection Time: 02/28/16  1:15 AM  Result Value Ref Range   Troponin I 0.03 (HH) <0.03 ng/mL    Comment: CRITICAL VALUE NOTED. VALUE IS CONSISTENT WITH PREVIOUSLY REPORTED/CALLED VALUE SNJ  APTT     Status: None   Collection Time: 02/28/16  1:15 AM  Result Value Ref Range   aPTT 31 24 - 36 seconds  Protime-INR     Status: None   Collection Time: 02/28/16  1:15 AM  Result Value Ref Range   Prothrombin Time 13.8 11.4 - 15.2 seconds   INR 1.06   Heparin level (unfractionated)     Status: Abnormal   Collection Time: 02/28/16  1:15 AM  Result Value Ref Range   Heparin Unfractionated  <0.10 (L) 0.30 - 0.70 IU/mL    Comment:        IF HEPARIN RESULTS ARE BELOW EXPECTED VALUES, AND PATIENT DOSAGE HAS BEEN CONFIRMED, SUGGEST FOLLOW UP TESTING OF ANTITHROMBIN III LEVELS.   MRSA PCR Screening     Status: None   Collection Time: 02/28/16  2:21 AM  Result Value Ref Range   MRSA by PCR NEGATIVE NEGATIVE    Comment:        The GeneXpert MRSA Assay (FDA approved for NASAL specimens only), is one component of a comprehensive MRSA colonization surveillance program. It is not intended to diagnose MRSA infection nor to guide or monitor treatment for MRSA infections.   Glucose, capillary     Status: Abnormal   Collection Time: 02/28/16  2:41 AM  Result Value Ref Range   Glucose-Capillary 309 (H) 65 - 99 mg/dL   Comment 1 Notify RN   Troponin I     Status: Abnormal   Collection Time: 02/28/16  5:46 AM  Result Value Ref Range   Troponin I 0.03 (HH) <0.03 ng/mL    Comment: CRITICAL VALUE NOTED. VALUE IS CONSISTENT WITH PREVIOUSLY REPORTED/CALLED VALUE SNJ  Glucose, capillary     Status: Abnormal   Collection Time: 02/28/16  7:43 AM  Result Value Ref Range   Glucose-Capillary 304 (H) 65 - 99 mg/dL  APTT     Status: Abnormal   Collection Time: 02/28/16  8:44 AM  Result Value Ref Range   aPTT 92 (H) 24 - 36 seconds  Comment:        IF BASELINE aPTT IS ELEVATED, SUGGEST PATIENT RISK ASSESSMENT BE USED TO DETERMINE APPROPRIATE ANTICOAGULANT THERAPY.   Basic metabolic panel     Status: Abnormal   Collection Time: 02/28/16  8:44 AM  Result Value Ref Range   Sodium 134 (L) 135 - 145 mmol/L   Potassium 4.1 3.5 - 5.1 mmol/L   Chloride 103 101 - 111 mmol/L   CO2 23 22 - 32 mmol/L   Glucose, Bld 336 (H) 65 - 99 mg/dL   BUN 22 (H) 6 - 20 mg/dL   Creatinine, Ser 1.30 (H) 0.44 - 1.00 mg/dL   Calcium 8.0 (L) 8.9 - 10.3 mg/dL   GFR calc non Af Amer 39 (L) >60 mL/min   GFR calc Af Amer 45 (L) >60 mL/min    Comment: (NOTE) The eGFR has been calculated using the CKD EPI  equation. This calculation has not been validated in all clinical situations. eGFR's persistently <60 mL/min signify possible Chronic Kidney Disease.    Anion gap 8 5 - 15  CBC     Status: Abnormal   Collection Time: 02/28/16  8:44 AM  Result Value Ref Range   WBC 13.6 (H) 3.6 - 11.0 K/uL   RBC 4.02 3.80 - 5.20 MIL/uL   Hemoglobin 11.1 (L) 12.0 - 16.0 g/dL   HCT 33.3 (L) 35.0 - 47.0 %   MCV 82.9 80.0 - 100.0 fL   MCH 27.5 26.0 - 34.0 pg   MCHC 33.2 32.0 - 36.0 g/dL   RDW 14.1 11.5 - 14.5 %   Platelets 325 150 - 440 K/uL  Glucose, capillary     Status: Abnormal   Collection Time: 02/28/16 11:20 AM  Result Value Ref Range   Glucose-Capillary 239 (H) 65 - 99 mg/dL  Troponin I     Status: None   Collection Time: 02/28/16 11:48 AM  Result Value Ref Range   Troponin I <0.03 <0.03 ng/mL  Troponin I     Status: Abnormal   Collection Time: 02/28/16  2:41 PM  Result Value Ref Range   Troponin I 0.03 (HH) <0.03 ng/mL    Comment: CRITICAL VALUE NOTED. VALUE IS CONSISTENT WITH PREVIOUSLY REPORTED/CALLED VALUE.MSS  Heparin level (unfractionated)     Status: None   Collection Time: 02/28/16  2:41 PM  Result Value Ref Range   Heparin Unfractionated 0.52 0.30 - 0.70 IU/mL    Comment:        IF HEPARIN RESULTS ARE BELOW EXPECTED VALUES, AND PATIENT DOSAGE HAS BEEN CONFIRMED, SUGGEST FOLLOW UP TESTING OF ANTITHROMBIN III LEVELS.   APTT     Status: Abnormal   Collection Time: 02/28/16  2:41 PM  Result Value Ref Range   aPTT 54 (H) 24 - 36 seconds    Comment:        IF BASELINE aPTT IS ELEVATED, SUGGEST PATIENT RISK ASSESSMENT BE USED TO DETERMINE APPROPRIATE ANTICOAGULANT THERAPY.   Glucose, capillary     Status: Abnormal   Collection Time: 02/28/16  4:11 PM  Result Value Ref Range   Glucose-Capillary 144 (H) 65 - 99 mg/dL    Current Facility-Administered Medications  Medication Dose Route Frequency Provider Last Rate Last Dose  . acetaminophen (TYLENOL) tablet 650 mg  650 mg  Oral Q6H PRN Lance Coon, MD   650 mg at 02/28/16 0640   Or  . acetaminophen (TYLENOL) suppository 650 mg  650 mg Rectal Q6H PRN Lance Coon, MD      .  heparin ADULT infusion 100 units/mL (25000 units/272m sodium chloride 0.45%)  1,200 Units/hr Intravenous Continuous SHillary Bow MD 12 mL/hr at 02/28/16 1527 1,200 Units/hr at 02/28/16 1527  . insulin aspart (novoLOG) injection 0-9 Units  0-9 Units Subcutaneous TID AC & HS DLance Coon MD   1 Units at 02/28/16 1645  . insulin glargine (LANTUS) injection 10 Units  10 Units Subcutaneous Daily SHillary Bow MD   10 Units at 02/28/16 1244  . metoprolol (LOPRESSOR) tablet 50 mg  50 mg Oral BID AIsaias Cowman MD   50 mg at 02/28/16 1244  . nicardipine (CARDENE) 247min 0.86% saline 20024mV infusion (0.1 mg/ml)  3-15 mg/hr Intravenous Continuous DavLance CoonD 10 mL/hr at 02/28/16 1805 1 mg/hr at 02/28/16 1805  . ondansetron (ZOFRAN) tablet 4 mg  4 mg Oral Q6H PRN DavLance CoonD       Or  . ondansetron (ZOAdventhealth Ocalanjection 4 mg  4 mg Intravenous Q6H PRN DavLance CoonD   4 mg at 02/28/16 0645  . oxyCODONE (Oxy IR/ROXICODONE) immediate release tablet 5 mg  5 mg Oral Q4H PRN DavLance CoonD   5 mg at 02/28/16 0311  . pantoprazole (PROTONIX) EC tablet 40 mg  40 mg Oral QAC breakfast DavLance CoonD   40 mg at 02/28/16 [id number] Musculoskeletal: Strength & Muscle Tone: decreased Gait & Station: unable to stand Patient leans: N/A  Psychiatric Specialty Exam: Physical Exam  Nursing note and vitals reviewed. Constitutional: She appears well-developed.  HENT:  Head: Normocephalic and atraumatic.  Eyes: Conjunctivae are normal. Pupils are equal, round, and reactive to light.  Neck: Normal range of motion.  Cardiovascular: Normal rate.   Respiratory: She is in respiratory distress.  GI: Soft.  Musculoskeletal: Normal range of motion.  Neurological: She is alert.  Skin: Skin is warm and dry.  Psychiatric: Her affect is blunt. Her  speech is delayed. She is slowed. Thought content is not paranoid. She expresses no homicidal and no suicidal ideation. She exhibits abnormal recent memory.    Review of Systems  Constitutional: Negative.   HENT: Negative.   Eyes: Negative.   Respiratory: Negative.   Cardiovascular: Negative.   Gastrointestinal: Negative.   Musculoskeletal: Negative.   Skin: Negative.   Neurological: Negative.   Psychiatric/Behavioral: Negative.     Blood pressure (!) 147/81, pulse 66, temperature 98.2 F (36.8 C), temperature source Oral, resp. rate 19, height 5' 4"  (1.626 m), weight 71.7 kg (158 lb), SpO2 93 %.Body mass index is 27.12 kg/m.  General Appearance: Casual  Eye Contact:  Fair  Speech:  Slow  Volume:  Decreased  Mood:  Euthymic  Affect:  Constricted  Thought Process:  Goal Directed  Orientation:  Negative  Thought Content:  Illogical and Tangential  Suicidal Thoughts:  No  Homicidal Thoughts:  No  Memory:  Immediate;   Fair Recent;   Poor Remote;   Fair  Judgement:  Impaired  Insight:  Shallow  Psychomotor Activity:  Decreased  Concentration:  Concentration: Poor  Recall:  Poor  Fund of Knowledge:  Fair  Language:  Fair  Akathisia:  No  Handed:  Right  AIMS (if indicated):     Assets:  Desire for Improvement Social Support  ADL's:  Impaired  Cognition:  Impaired,  Mild and Moderate  Sleep:        Treatment Plan Summary: Plan [age]ar old woman back in the intensive care unit. No sign of depression. No sign of psychosis. To examination  today she has mild dementia. She was not able to describe any of her medical problems or give any coherent understanding of what medicine she took. Her friend who lives with her was not subject of the examination but he can say also did not seem to be particularly well informed about all of her medical problems. I'm not sure what else I can add to this. Clearly there is a problem with whatever system is getting her to take her medicine that  needs to be fixed. She is going to keep coming back into the hospital like this. Whatever the set up visit home right now is not working. No indication however for any psychiatric treatment.  Disposition: Patient does not meet criteria for psychiatric inpatient admission.  Alethia Berthold, MD 02/28/2016 6:19 PM
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
## 2016-02-28 NOTE — Plan of Care (Signed)
Problem: Physical Regulation: Goal: Ability to maintain clinical measurements within normal limits will improve Outcome: Progressing Patient taken off cardene earlier in shift (see note about bradycardia incident), placed back on in afternoon due to elevated blood pressure not relieved by oral metoprolol. Titrated to try and maintain blood pressure goals. Patient did drift down to upper 50s in afternoon but without severe bradycardia of the morning. While patient would occasionally complain of pain, would refuse RN's offers of medication. RN would help patient reposition and patient would fall asleep after RN left room.  Problem: Tissue Perfusion: Goal: Risk factors for ineffective tissue perfusion will decrease Outcome: Progressing Patient on heparin infusion, increased per orders.

## 2016-02-28 NOTE — Progress Notes (Signed)
[employer] - [place] at [facility]   PATIENT NAME: Robin [name]    MR#:  [id number]  DATE OF BIRTH:  11/08/1940  SUBJECTIVE:  CHIEF COMPLAINT:   Chief Complaint  Patient presents with  . Hypertension   Complains of dizziness and nausea. Bradycardic today into 30s. REVIEW OF SYSTEMS:    Review of Systems  Constitutional: Positive for malaise/fatigue. Negative for chills and fever.  HENT: Negative for sore throat.   Eyes: Negative for blurred vision, double vision and pain.  Respiratory: Negative for cough, hemoptysis, shortness of breath and wheezing.   Cardiovascular: Negative for chest pain, palpitations, orthopnea and leg swelling.  Gastrointestinal: Positive for abdominal pain. Negative for constipation, diarrhea, heartburn, nausea and vomiting.  Genitourinary: Negative for dysuria and hematuria.  Musculoskeletal: Negative for back pain and joint pain.  Skin: Negative for rash.  Neurological: Positive for dizziness and weakness. Negative for sensory change, speech change, focal weakness and headaches.  Endo/Heme/Allergies: Does not bruise/bleed easily.  Psychiatric/Behavioral: Negative for depression. The patient is not nervous/anxious.     DRUG ALLERGIES:  No Known Allergies  VITALS:  Blood pressure (!) 137/57, pulse (!) 48, temperature (!) 96.7 F (35.9 C), temperature source Axillary, resp. rate 13, height 5\' 4"  (1.626 m), weight 71.7 kg (158 lb), SpO2 96 %.  PHYSICAL EXAMINATION:   Physical Exam  GENERAL:  75 y.o.-year-old patient lying in the bed with no acute distress.  EYES: Pupils equal, round, reactive to light and accommodation. No scleral icterus. Extraocular muscles intact.  HEENT: Head atraumatic, normocephalic. Oropharynx and nasopharynx clear.  NECK:  Supple, no jugular venous distention. No thyroid enlargement, no tenderness.  LUNGS: Normal breath sounds bilaterally, no wheezing, rales, rhonchi. No use of accessory muscles of  respiration.  CARDIOVASCULAR: S1 and S2 with bradycardia ABDOMEN: Soft, Epigastric tenderness, nondistended. Bowel sounds present. No organomegaly or mass.  EXTREMITIES: No cyanosis, clubbing or edema b/l.    NEUROLOGIC: Cranial nerves II through XII are intact. No focal Motor or sensory deficits b/l.   PSYCHIATRIC: The patient is alert and oriented x 3.  SKIN: No obvious rash, lesion, or ulcer.   LABORATORY PANEL:   CBC  Recent Labs Lab 02/28/16 0844  WBC 13.6*  HGB 11.1*  HCT 33.3*  PLT 325   ------------------------------------------------------------------------------------------------------------------ Chemistries   Recent Labs Lab 02/27/16 2205 02/28/16 0844  NA 137 134*  K 4.1 4.1  CL 102 103  CO2 25 23  GLUCOSE 259* 336*  BUN 23* 22*  CREATININE 1.12* 1.30*  CALCIUM 8.8* 8.0*  AST 22  --   ALT 16  --   ALKPHOS 114  --   BILITOT 0.5  --    ------------------------------------------------------------------------------------------------------------------  Cardiac Enzymes  Recent Labs Lab 02/28/16 0546  TROPONINI 0.03*   ------------------------------------------------------------------------------------------------------------------  RADIOLOGY:  Ct Angio Chest Aorta W And/or Wo Contrast  Result Date: 02/28/2016 CLINICAL DATA:  Severe epigastric pain radiating to the back with nausea she has a history of renal artery thrombosis EXAM: CT ANGIOGRAPHY CHEST, ABDOMEN AND PELVIS TECHNIQUE: Multidetector CT imaging through the chest, abdomen and pelvis was performed using the standard protocol during bolus administration of intravenous contrast. Multiplanar reconstructed images and MIPs were obtained and reviewed to evaluate the vascular anatomy. CONTRAST:  100 mL Isovue 370 intravenous COMPARISON:  CT abdomen 02/06/2016 FINDINGS: CTA CHEST FINDINGS Cardiovascular: There is no dissection involving the ascending or descending thoracic aorta. There is common origin  of the left common carotid artery and the right brachiocephalic artery.  Left subclavian artery has its takeoff from the distal arch which is left-sided. There is atherosclerosis of the aorta. There are post CABG changes present. Central pulmonary arteries demonstrate no filling defects. There is coronary artery calcification. There is an oval filling defect within the left atrial appendage, series 6, image number 38 which is suspicious for a thrombus. The heart is slightly enlarged. There is no significant pericardial effusion. Mediastinum/Nodes: There are mildly enlarged mediastinal lymph nodes. A node anterior to the aortic arch measures 1.3 cm. A right paratracheal lymph node measures 1.4 cm. No hilar adenopathy. Imaged thyroid gland within normal limits. Trachea is midline. Esophagus grossly unremarkable. Lungs/Pleura: Hazy attenuation is present bilaterally, which may represent diffuse atelectasis or mild edema, there is suggestion of mild septal thickening. There are small bilateral pleural effusions present. There is no acute consolidation. Musculoskeletal: There are degenerative changes. Post sternotomy changes. No acute osseous abnormality. Review of the MIP images confirms the above findings. CTA ABDOMEN AND PELVIS FINDINGS VASCULAR Aorta: Atherosclerosis and mural thrombus is present within the abdominal aorta. No discrete aneurysm is visualized. There is a small linear density within the distal abdominal aorta just above the bifurcation which may represent a small intimal flap, series 6, image number 124. 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 SMA: Calcification and plaque are present at the origin of the SMA with mild, less than 50% stenosis suggested. Renals: The right renal artery is occluded at its origin. Moderate severe stenosis suggested at the origin of the left renal artery. No definitive thrombus is visualized within the renal artery. IMA: Calcifications and  thrombus present at the origin of the IMA, IMA is patent after the origin. Inflow: Moderate atherosclerotic calcification and mural thrombus of the distal aorta. Scattered atherosclerotic calcification within the right external iliac artery. Severe stenosis of the proximal SFA on the right with eventual occlusion of the proximal to mid superficial femoral artery. Mild atherosclerotic calcification of the left external iliac artery. Mild calcification at the left common femoral artery. There is patency of the left superficial femoral artery proximally. Veins: Suboptimally evaluated on arterial study Review of the MIP images confirms the above findings. NON-VASCULAR Hepatobiliary: There is no focal hepatic abnormality visualized. No calcified gallstones. No biliary dilatation. Pancreas: Unremarkable. No pancreatic ductal dilatation or surrounding inflammatory changes. Spleen: Normal in size without focal abnormality. Adrenals/Urinary Tract: The adrenal glands are within normal limits. Again visualized is hypoperfusion of the right kidney consistent with infarction, renal size has decreased compared to the prior study. Now seen is wedge-shaped hypodensity within the left kidney consistent with left renal infarction. The urinary bladder is unremarkable. Stomach/Bowel: Stomach is nonenlarged. There is no dilated small bowel. No colon wall thickening. There is diverticular disease without acute inflammation. Lymphatic: No significantly enlarged lymph nodes. Reproductive: Probable partially calcified uterine fibroid. No adnexal mass. Other: Trace free fluid in the pelvis.  No free air. 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 Review of the MIP images confirms the above findings. IMPRESSION: 1. No evidence for acute dissection involving the ascending or descending thoracic aorta. Suspect short segment focal dissection involving the distal abdominal aorta just above the  bifurcation. 2. Oval filling defect within the left atrial appendage is suspicious for thrombus. Suggest correlation with echocardiography. 3. Hypoperfused right kidney with occluded right renal artery. Right kidney has decreased in size compared to the prior CT scan. New wedge-shaped peripheral hypodensities in the left kidney,  consistent with acute left renal infarctions. There is severe stenosis at the origin of the left renal artery. 4. 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 5. 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 Severe stenosis/occlusion of the imaged portions of the right proximal superficial femoral artery. Critical Value/emergent results were called by telephone at the time of interpretation on 02/28/2016 at 12:26 am to Dr. Manson Passey, who verbally acknowledged these results. Electronically Signed   By: Jasmine Pang M.D.   On: 02/28/2016 00:26   Ct Angio Abd/pel W And/or Wo Contrast  Result Date: 02/28/2016 CLINICAL DATA:  Severe epigastric pain radiating to the back with nausea she has a history of renal artery thrombosis EXAM: CT ANGIOGRAPHY CHEST, ABDOMEN AND PELVIS TECHNIQUE: Multidetector CT imaging through the chest, abdomen and pelvis was performed using the standard protocol during bolus administration of intravenous contrast. Multiplanar reconstructed images and MIPs were obtained and reviewed to evaluate the vascular anatomy. CONTRAST:  100 mL Isovue 370 intravenous COMPARISON:  CT abdomen 02/06/2016 FINDINGS: CTA CHEST FINDINGS Cardiovascular: There is no dissection involving the ascending or descending thoracic aorta. There is common origin of the left common carotid artery and the right brachiocephalic artery. Left subclavian artery has its takeoff from the distal arch which is left-sided. There is atherosclerosis of the aorta. There are post CABG changes present. Central pulmonary arteries demonstrate no filling defects. There is coronary artery calcification.  There is an oval filling defect within the left atrial appendage, series 6, image number 38 which is suspicious for a thrombus. The heart is slightly enlarged. There is no significant pericardial effusion. Mediastinum/Nodes: There are mildly enlarged mediastinal lymph nodes. A node anterior to the aortic arch measures 1.3 cm. A right paratracheal lymph node measures 1.4 cm. No hilar adenopathy. Imaged thyroid gland within normal limits. Trachea is midline. Esophagus grossly unremarkable. Lungs/Pleura: Hazy attenuation is present bilaterally, which may represent diffuse atelectasis or mild edema, there is suggestion of mild septal thickening. There are small bilateral pleural effusions present. There is no acute consolidation. Musculoskeletal: There are degenerative changes. Post sternotomy changes. No acute osseous abnormality. Review of the MIP images confirms the above findings. CTA ABDOMEN AND PELVIS FINDINGS VASCULAR Aorta: Atherosclerosis and mural thrombus is present within the abdominal aorta. No discrete aneurysm is visualized. There is a small linear density within the distal abdominal aorta just above the bifurcation which may represent a small intimal flap, series 6, image number 124. 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 SMA: Calcification and plaque are present at the origin of the SMA with mild, less than 50% stenosis suggested. Renals: The right renal artery is occluded at its origin. Moderate severe stenosis suggested at the origin of the left renal artery. No definitive thrombus is visualized within the renal artery. IMA: Calcifications and thrombus present at the origin of the IMA, IMA is patent after the origin. Inflow: Moderate atherosclerotic calcification and mural thrombus of the distal aorta. Scattered atherosclerotic calcification within the right external iliac artery. Severe stenosis of the proximal SFA on the right with eventual occlusion of the proximal to  mid superficial femoral artery. Mild atherosclerotic calcification of the left external iliac artery. Mild calcification at the left common femoral artery. There is patency of the left superficial femoral artery proximally. Veins: Suboptimally evaluated on arterial study Review of the MIP images confirms the above findings. NON-VASCULAR Hepatobiliary: There is no focal hepatic abnormality visualized. No calcified gallstones. No biliary dilatation. Pancreas:  Unremarkable. No pancreatic ductal dilatation or surrounding inflammatory changes. Spleen: Normal in size without focal abnormality. Adrenals/Urinary Tract: The adrenal glands are within normal limits. Again visualized is hypoperfusion of the right kidney consistent with infarction, renal size has decreased compared to the prior study. Now seen is wedge-shaped hypodensity within the left kidney consistent with left renal infarction. The urinary bladder is unremarkable. Stomach/Bowel: Stomach is nonenlarged. There is no dilated small bowel. No colon wall thickening. There is diverticular disease without acute inflammation. Lymphatic: No significantly enlarged lymph nodes. Reproductive: Probable partially calcified uterine fibroid. No adnexal mass. Other: Trace free fluid in the pelvis.  No free air. 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 Review of the MIP images confirms the above findings. IMPRESSION: 1. No evidence for acute dissection involving the ascending or descending thoracic aorta. Suspect short segment focal dissection involving the distal abdominal aorta just above the bifurcation. 2. Oval filling defect within the left atrial appendage is suspicious for thrombus. Suggest correlation with echocardiography. 3. Hypoperfused right kidney with occluded right renal artery. Right kidney has decreased in size compared to the prior CT scan. New wedge-shaped peripheral hypodensities in the left kidney, consistent  with acute left renal infarctions. There is severe stenosis at the origin of the left renal artery. 4. 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 5. 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 Severe stenosis/occlusion of the imaged portions of the right proximal superficial femoral artery. Critical Value/emergent results were called by telephone at the time of interpretation on 02/28/2016 at 12:26 am to Dr. Manson Passey, who verbally acknowledged these results. Electronically Signed   By: Jasmine Pang M.D.   On: 02/28/2016 00:26     ASSESSMENT AND PLAN:   # Bradyardia with atrial fibrillation Hold metoprolol and Cardizem. Patient given 2 doses of atropine and heart rate improved. Consult cardiology.    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 Patient was started on heparin drip . Vascular surgery consulted in regards to this problem and also her renal artery stenosis.   # Aortic mural thrombus with small dissection- anticoagulation started as above, vascular surgery consult as above.   # Renal artery stenosis  - likely contributing to her significantly elevated blood pressure, we will have her on a nicardipine drip to try and get her pressure down tonight, vascular surgery consult as above   # Accelerated hypertension - nicardipine drip stopped.   # CAD (coronary artery disease) - continue home meds, trend cardiac enzymes   # Chronic systolic CHF (congestive heart failure)- continue home meds    Diabetes - sliding scale insulin with corresponding glucose checks    A-fib - continue home meds    HLD (hyperlipidemia) - home dose anti-lipids  All the records are reviewed and case discussed with Care Management/Social Workerr. Management plans discussed with the patient, family and they are in agreement.  CODE STATUS: FULL CODE  DVT Prophylaxis: SCDs  TOTAL CC TIME TAKING CARE OF THIS PATIENT: 35 minutes.   Milagros Loll R M.D on  02/28/2016 at 10:16 AM  Between 7am to 6pm - Pager - [phone]  After 6pm go to [email or web address] - password [email or web address] [facility]  [facility]  Office  [phone]  CC: Primary care physician; Leanna Sato, MD  Note: This dictation was prepared with Dragon dictation along with smaller phrase technology. Any transcriptional errors that result from this process are unintentional.

## 2016-02-28 NOTE — Progress Notes (Signed)
Inpatient Diabetes Program Recommendations  AACE/ADA: New Consensus Statement on Inpatient Glycemic Control (2015)  Target Ranges:  Prepandial:   less than 140 mg/dL      Peak postprandial:   less than 180 mg/dL (1-2 hours)      Critically ill patients:  140 - 180 mg/dL   Lab Results  Component Value Date   GLUCAP 304 (H) 02/28/2016   HGBA1C 8.7 (H) 02/15/2016    Review of Glycemic Control  Inpatient Diabetes Program Recommendations:   MD- Please consider ordering low dose basal insulin for patient during hospitalization  Recommend ordering Lantus 10units Q24H starting now (0.15 units/kg dosing)  Will follow during hospitalization.  Thank you, Robin [name]. Moroni Nester, RN, MSN, CDE Inpatient Glycemic Control Team Team Pager [phone] (8am-5pm) 02/28/2016 9:41 AM

## 2016-02-28 NOTE — Progress Notes (Signed)
Patient's heart rate decreased to mid 30s this morning (see vital sign flowsheets). Patient at time was drowsy, dizzy, and nauseous with palpable pulse. RN stopped cardene infusion (SBP in 120s/110s), RN and charge RN stayed at bedside and attached pacing/defib pads to patient and hooked them up to zoll but did not pace while notifying MD about bradycardia and runs of SVT that morning before shift (one around 0600 and one around 0700). MD ordered atropine 0.4 mg IV stat. RN gave and heart rate improved into low 40s but would not sustain. MD ordered another dose of IV atropine at 1 mg. RN gave that and heart rate gradually improved to 60s. MD also ordered isoproterenol IV infusion but heart rate improved above parameters (to keep HR greater than 50) before could be compounded by pharmacy, so MD ordered RN to not give at this time. Team will continue to monitor.

## 2016-02-28 NOTE — Progress Notes (Signed)
IV site in left [facility] blew.  IV removed, catheter intact.  Another IV was attempted a total of four times by this nurse and Tess,rn , but unsuccessful.  Pt.  now declining further IV attempts.  Pt was educated on ordered drips and the potential need for the nicardipine drip to be restarted.  Pt still declined further attempts.  Per pharmacy, ok to run nicardipine drip and heparin drip together but monitor for any cloudiness in lines.  At this time, nicardipine drip on hold.

## 2016-02-28 NOTE — Progress Notes (Signed)
ANTICOAGULATION CONSULT NOTE - Initial Consult  Pharmacy Consult for heparin Indication: DVT  No Known Allergies  Patient Measurements: Height: 5\' 4"  (162.6 cm) Weight: 158 lb (71.7 kg) IBW/kg (Calculated) : 54.7 Heparin Dosing Weight: 69.4 kg  Vital Signs: Temp: 97.7 F (36.5 C) (12/18 2140) Temp Source: Oral (12/18 2140) BP: 205/67 (12/19 0101) Pulse Rate: 76 (12/19 0101)  Labs:  Recent Labs  02/27/16 2205  HGB 12.1  HCT 37.2  PLT 357  CREATININE 1.12*  TROPONINI 0.03*    Estimated Creatinine Clearance: 42.1 mL/min (by C-G formula based on SCr of 1.12 mg/dL (H)).   Medical History: Past Medical History:  Diagnosis Date  . Afib (HCC)   . Anxiety   . Asthma   . CHF (congestive heart failure) (HCC)   . Coronary artery disease   . Diabetes mellitus without complication (HCC)   . Hyperlipidemia   . Hypertension     Medications:  Infusions:  . heparin      Assessment: 75 yof cc hypertension presents to ED after recent hospital visit for renal thromboembolism/infarct during which time she started Xarelto. Patient is moderately confident she took Xarelto 12/18 PM. Will draw baseline APTT/HL/PT/INR stat. Per RN patient has multiple clots, so we will not delay therapy waiting for the lab results to return.  Goal of Therapy:  aPTT 66 to 102 seconds Monitor platelets by anticoagulation protocol: Yes   Plan:  Give 3500 units bolus x 1 Start heparin infusion at 1100 units/hr Check aPTT in 6 hours and daily while on heparin Continue to monitor H&H and platelets  Carola Frost, Pharm.D., BCPS Clinical Pharmacist 02/28/2016,1:12 AM

## 2016-02-29 LAB — ECHOCARDIOGRAM COMPLETE
AO mean calculated velocity dopler: 212 cm/s
AOPV: 0.39 m/s
AOVTI: 76 cm
AV Area VTI: 1.34 cm2
AV Area mean vel: 1.32 cm2
AV Peak grad: 37 mmHg
AV VEL mean LVOT/AV: 0.38
AV pk vel: 305 cm/s
AVAREAMEANVIN: 0.73 cm2/m2
AVAREAVTIIND: 0.73 cm2/m2
AVG: 20 mmHg
AVPHT: 350 ms
CHL CUP AV PEAK INDEX: 0.74
CHL CUP AV VALUE AREA INDEX: 0.73
CHL CUP AV VEL: 1.32
E decel time: 215 msec
E/e' ratio: 37.57
FS: 28 % (ref 28–44)
HEIGHTINCHES: 64 in
IV/PV OW: 1
LA diam end sys: 49 mm
LA diam index: 2.7 cm/m2
LASIZE: 49 mm
LAVOL: 94.4 mL
LAVOLA4C: 91 mL
LAVOLIN: 52 mL/m2
LV PW d: 9.93 mm — AB (ref 0.6–1.1)
LVEEAVG: 37.57
LVEEMED: 37.57
LVELAT: 5.03 cm/s
LVOT VTI: 29.1 cm
LVOT area: 3.46 cm2
LVOT diameter: 21 mm
LVOT peak VTI: 0.38 cm
LVOT peak grad rest: 6 mmHg
LVOTPV: 118 cm/s
LVOTSV: 101 mL
Lateral S' vel: 8.25 cm/s
MV Dec: 215
MV Peak grad: 14 mmHg
MV pk E vel: 189 m/s
MVPKAVEL: 49.4 m/s
TAPSE: 21.2 mm
TDI e' lateral: 5.03
Valve area: 1.32 cm2
Weight: 2528 oz

## 2016-02-29 LAB — GLUCOSE, CAPILLARY
GLUCOSE-CAPILLARY: 228 mg/dL — AB (ref 65–99)
Glucose-Capillary: 244 mg/dL — ABNORMAL HIGH (ref 65–99)
Glucose-Capillary: 158 mg/dL — ABNORMAL HIGH (ref 65–99)
Glucose-Capillary: 367 mg/dL — ABNORMAL HIGH (ref 65–99)

## 2016-02-29 LAB — CBC
HEMATOCRIT: 32.1 % — AB (ref 35.0–47.0)
HEMOGLOBIN: 10.7 g/dL — AB (ref 12.0–16.0)
MCH: 27.6 pg (ref 26.0–34.0)
MCHC: 33.3 g/dL (ref 32.0–36.0)
MCV: 82.8 fL (ref 80.0–100.0)
Platelets: 304 10*3/uL (ref 150–440)
RBC: 3.88 MIL/uL (ref 3.80–5.20)
RDW: 14 % (ref 11.5–14.5)
WBC: 16.6 10*3/uL — ABNORMAL HIGH (ref 3.6–11.0)

## 2016-02-29 LAB — RENAL FUNCTION PANEL
ANION GAP: 7 (ref 5–15)
Albumin: 2.9 g/dL — ABNORMAL LOW (ref 3.5–5.0)
BUN: 21 mg/dL — ABNORMAL HIGH (ref 6–20)
CALCIUM: 8.6 mg/dL — AB (ref 8.9–10.3)
CHLORIDE: 106 mmol/L (ref 101–111)
CO2: 26 mmol/L (ref 22–32)
Creatinine, Ser: 1.19 mg/dL — ABNORMAL HIGH (ref 0.44–1.00)
GFR calc non Af Amer: 44 mL/min — ABNORMAL LOW (ref >60)
GFR, EST AFRICAN AMERICAN: 50 mL/min — AB (ref >60)
GLUCOSE: 134 mg/dL — AB (ref 65–99)
POTASSIUM: 3.9 mmol/L (ref 3.5–5.1)
Phosphorus: 3.3 mg/dL (ref 2.5–4.6)
SODIUM: 139 mmol/L (ref 135–145)

## 2016-02-29 LAB — HEPARIN LEVEL (UNFRACTIONATED)
Heparin Unfractionated: 0.6 IU/mL (ref 0.30–0.70)
Heparin Unfractionated: 0.62 IU/mL (ref 0.30–0.70)

## 2016-02-29 LAB — APTT: aPTT: 89 seconds — ABNORMAL HIGH (ref 24–36)

## 2016-02-29 MED ORDER — METHYLPREDNISOLONE SODIUM SUCC 40 MG IJ SOLR
40.0000 mg | Freq: Once | INTRAMUSCULAR | Status: AC
  Administered 2016-02-29: 40 mg via INTRAVENOUS
  Filled 2016-02-29: qty 1

## 2016-02-29 MED ORDER — IPRATROPIUM-ALBUTEROL 0.5-2.5 (3) MG/3ML IN SOLN
3.0000 mL | RESPIRATORY_TRACT | Status: DC
  Administered 2016-02-29 – 2016-03-01 (×2): 3 mL via RESPIRATORY_TRACT
  Filled 2016-02-29 (×4): qty 3

## 2016-02-29 MED ORDER — ALUM & MAG HYDROXIDE-SIMETH 200-200-20 MG/5ML PO SUSP
30.0000 mL | Freq: Four times a day (QID) | ORAL | Status: DC | PRN
  Administered 2016-02-29 – 2016-03-03 (×3): 30 mL via ORAL
  Filled 2016-02-29 (×3): qty 30

## 2016-02-29 MED ORDER — AMLODIPINE BESYLATE 5 MG PO TABS
5.0000 mg | ORAL_TABLET | Freq: Every day | ORAL | Status: DC
Start: 2016-02-29 — End: 2016-03-03
  Administered 2016-02-29 – 2016-03-02 (×3): 5 mg via ORAL
  Filled 2016-02-29 (×3): qty 1

## 2016-02-29 MED ORDER — MOMETASONE FURO-FORMOTEROL FUM 200-5 MCG/ACT IN AERO
2.0000 | INHALATION_SPRAY | Freq: Two times a day (BID) | RESPIRATORY_TRACT | Status: DC
  Administered 2016-02-29 – 2016-03-05 (×9): 2 via RESPIRATORY_TRACT
  Filled 2016-02-29: qty 8.8

## 2016-02-29 MED ORDER — OXYCODONE-ACETAMINOPHEN 5-325 MG PO TABS
1.0000 | ORAL_TABLET | ORAL | Status: DC | PRN
  Administered 2016-03-03 – 2016-03-05 (×3): 1 via ORAL
  Filled 2016-02-29 (×5): qty 1

## 2016-02-29 MED ORDER — ORAL CARE MOUTH RINSE
15.0000 mL | Freq: Two times a day (BID) | OROMUCOSAL | Status: DC
  Administered 2016-02-29 – 2016-03-05 (×6): 15 mL via OROMUCOSAL

## 2016-02-29 NOTE — Progress Notes (Signed)
Pt is alert but is confused as to situation.  She has been sinus rhythm, sinus brady on the cardiac monitor.  Heart rate as low as 58bpm.  She remains on 2L nasal canula.  Lungs are diminished and she becomes dyspneic with exertion.  Pt has had urine output of 300cc.Nicardipine drip is now paused once again.  Pt complained of headache pain once during the night and tylenol 650mg  was given with relief noted.  Pt also complained of nausea once during the night and zofran was given with relief.  Pt was able to rest comfortably during the night. Will continue to monitor.

## 2016-02-29 NOTE — Consult Note (Signed)
[facility] CONSULT NOTE    Date: 02/29/2016                  Patient Name:  Robin [name]  MRN: [id number]  DOB: [birth year]  Age / Sex: 75 y.o., female         PCP: Leanna Sato, MD                 Service Requesting Consult: Dr. Gilda Crease                 Reason for Consult: Acute renal infarction            History of Present Illness: Patient is a 75 y.o. female with a PMHx of Paroxysmal atrial fibrillation, congestive heart failure, coronary artery disease, diabetes mellitus type 2, hyperlipidemia, hypertension, who was admitted to [facility] on 02/27/2016 for evaluation of  left-sided flank pain. The patient had CT scan of the abdomen and pelvis with contrast upon admission. This showed a hyperperfused right kidney which had decreased in size as compared to the prior recent examination. In addition she was found to have peripheral hypodensities within the left kidney and left renal artery stenosis. This was consistent with left renal infarctions.  The patient's baseline creatinine appears to be 0.7 on 02/15/2016. Creatinine has now risen to 1.3. Creatinine for today is still pending.  She also had severe hypertension upon admission. She has been started on nicardipine drip. In addition she is on a heparin drip. She has been seen by vascular surgery and further plan is pending at the moment.  Previously she was advised to taser alto but was not taking it as an outpatient.   Medications: Outpatient medications: Prescriptions Prior to Admission  Medication Sig Dispense Refill Last Dose  . albuterol (ACCUNEB) 1.25 MG/3ML nebulizer solution Take 1.25 mg by nebulization every 4 (four) hours as needed for wheezing or shortness of breath.    prn at prn  . diltiazem (CARDIZEM CD) 180 MG 24 hr capsule Take 1 capsule (180 mg total) by mouth 2 (two) times daily. 60 capsule 0 02/27/2016 at Unknown time  . meclizine (ANTIVERT) 25 MG tablet Take 1 tablet (25 mg total) by mouth 3  (three) times daily as needed for dizziness. 30 tablet 0 prn at prn  . metoprolol (LOPRESSOR) 50 MG tablet Take 1 tablet (50 mg total) by mouth 3 (three) times daily. 90 tablet 0 02/27/2016 at 2100  . nitroGLYCERIN (NITROSTAT) 0.4 MG SL tablet Place 1 tablet (0.4 mg total) under the tongue every 5 (five) minutes as needed for chest pain. 30 tablet 0 prn at prn  . ondansetron (ZOFRAN ODT) 4 MG disintegrating tablet Take 1 tablet (4 mg total) by mouth every 8 (eight) hours as needed for nausea or vomiting. 20 tablet 0 prn at prn  . pantoprazole (PROTONIX) 40 MG tablet Take 1 tablet (40 mg total) by mouth daily. (Patient taking differently: Take 40 mg by mouth daily as needed. For acid reflux) 30 tablet 2 prn at prn  . Rivaroxaban (XARELTO) 15 MG TABS tablet Take 1 tablet (15 mg total) by mouth 2 (two) times daily with a meal. 42 tablet 0 02/27/2016 at Unknown time  . traMADol (ULTRAM) 50 MG tablet Take 1 tablet (50 mg total) by mouth every 12 (twelve) hours as needed for moderate pain or severe pain. 20 tablet 0 prn at prn  . [START ON 03/03/2016] rivaroxaban (XARELTO) 20 MG TABS tablet Take 1 tablet (20 mg  total) by mouth daily with supper. (Patient not taking: Reported on 02/28/2016) 30 tablet 0 Not Taking at Unknown time    Current medications: Current Facility-Administered Medications  Medication Dose Route Frequency Provider Last Rate Last Dose  . acetaminophen (TYLENOL) tablet 650 mg  650 mg Oral Q6H PRN Oralia Manis, MD   650 mg at 02/28/16 2013   Or  . acetaminophen (TYLENOL) suppository 650 mg  650 mg Rectal Q6H PRN Oralia Manis, MD      . amLODipine (NORVASC) tablet 5 mg  5 mg Oral Daily Enid Baas, MD   5 mg at 02/29/16 1121  . heparin ADULT infusion 100 units/mL (25000 units/221mL sodium chloride 0.45%)  1,200 Units/hr Intravenous Continuous Srikar Sudini, MD 12 mL/hr at 02/29/16 0700 1,200 Units/hr at 02/29/16 0700  . insulin aspart (novoLOG) injection 0-9 Units  0-9 Units  Subcutaneous TID AC & HS Oralia Manis, MD   2 Units at 02/29/16 1717  . insulin glargine (LANTUS) injection 10 Units  10 Units Subcutaneous Daily Milagros Loll, MD   10 Units at 02/29/16 1038  . ipratropium-albuterol (DUONEB) 0.5-2.5 (3) MG/3ML nebulizer solution 3 mL  3 mL Nebulization Q4H Enid Baas, MD   3 mL at 02/29/16 1630  . MEDLINE mouth rinse  15 mL Mouth Rinse BID Enid Baas, MD   15 mL at 02/29/16 1412  . metoprolol (LOPRESSOR) tablet 50 mg  50 mg Oral BID Marcina Millard, MD   50 mg at 02/29/16 1037  . mometasone-formoterol (DULERA) 200-5 MCG/ACT inhaler 2 puff  2 puff Inhalation BID Enid Baas, MD   2 puff at 02/29/16 1344  . nicardipine (CARDENE) 20mg  in 0.86% saline IV infusion (0.1 mg/ml)  3-15 mg/hr Intravenous Continuous Oralia Manis, MD 30 mL/hr at 02/29/16 1717 3 mg/hr at 02/29/16 1717  . ondansetron (ZOFRAN) tablet 4 mg  4 mg Oral Q6H PRN Oralia Manis, MD       Or  . ondansetron [facility]) injection 4 mg  4 mg Intravenous Q6H PRN Oralia Manis, MD   4 mg at 02/29/16 5427  . oxyCODONE-acetaminophen (PERCOCET/ROXICET) 5-325 MG per tablet 1 tablet  1 tablet Oral Q4H PRN Enid Baas, MD      . pantoprazole (PROTONIX) EC tablet 40 mg  40 mg Oral QAC breakfast Oralia Manis, MD   40 mg at 02/29/16 0623      Allergies: No Known Allergies    Past Medical History: Past Medical History:  Diagnosis Date  . Afib (HCC)   . Anxiety   . Asthma   . CHF (congestive heart failure) (HCC)   . Coronary artery disease   . Diabetes mellitus without complication (HCC)   . Hyperlipidemia   . Hypertension      Past Surgical History: Past Surgical History:  Procedure Laterality Date  . CORONARY ARTERY BYPASS GRAFT    . NO PAST SURGERIES       Family History: Family History  Problem Relation Age of Onset  . Hypertension Mother   . Heart failure Mother   . Heart disease Father      Social History: Social History   Social History  .  Marital status: Single    Spouse name: N/A  . Number of children: N/A  . Years of education: N/A   Occupational History  . Not on file.   Social History Main Topics  . Smoking status: Never Smoker  . Smokeless tobacco: Never Used  . Alcohol use No  . Drug use: No  .  Sexual activity: No   Other Topics Concern  . Not on file   Social History Narrative  . No narrative on file     Review of Systems: Review of Systems  Constitutional: Positive for malaise/fatigue. Negative for chills, fever and weight loss.  HENT: Negative for ear pain, hearing loss and tinnitus.   Eyes: Negative for blurred vision, double vision and photophobia.  Respiratory: Negative for cough, hemoptysis and sputum production.   Cardiovascular: Negative for chest pain, palpitations and orthopnea.  Gastrointestinal: Positive for abdominal pain. Negative for heartburn, nausea and vomiting.  Genitourinary: Negative for dysuria, frequency and urgency.  Musculoskeletal: Negative for back pain, myalgias and neck pain.  Skin: Negative for itching and rash.  Neurological: Negative for dizziness and focal weakness.  Endo/Heme/Allergies: Negative for polydipsia. Does not bruise/bleed easily.  Psychiatric/Behavioral: Negative for depression. The patient is nervous/anxious.      Vital Signs: Blood pressure (!) 170/75, pulse 67, temperature 98.5 F (36.9 C), temperature source Oral, resp. rate 20, height 5\' 4"  (1.626 m), weight 71.7 kg (158 lb), SpO2 91 %.  Weight trends: Filed Weights   02/27/16 2136  Weight: 71.7 kg (158 lb)    Physical Exam: General: NAD, sitting up in chair  Head: Normocephalic, atraumatic.  Eyes: Anicteric, EOMI  Nose: Mucous membranes moist, not inflammed, nonerythematous.  Throat: Oropharynx nonerythematous, no exudate appreciated.   Neck: Supple, trachea midline.  Lungs:  Normal respiratory effort. Clear to auscultation BL without crackles or wheezes.  Heart: Irregular.  Abdomen:   Mild left sided abdominal tenderness  Extremities: Trace pretibial edema  Neurologic: A&O X3, Motor strength is 5/5 in the all 4 extremities  Skin: No visible rashes, scars.    Lab results: Basic Metabolic Panel:  Recent Labs Lab 02/27/16 2205 02/28/16 0844  NA 137 134*  K 4.1 4.1  CL 102 103  CO2 25 23  GLUCOSE 259* 336*  BUN 23* 22*  CREATININE 1.12* 1.30*  CALCIUM 8.8* 8.0*    Liver Function Tests:  Recent Labs Lab 02/27/16 2205  AST 22  ALT 16  ALKPHOS 114  BILITOT 0.5  PROT 7.6  ALBUMIN 3.6    Recent Labs Lab 02/27/16 2205  LIPASE 13   No results for input(s): AMMONIA in the last 168 hours.  CBC:  Recent Labs Lab 02/27/16 2205 02/28/16 0844 02/29/16 0021  WBC 13.7* 13.6* 16.6*  NEUTROABS 11.4*  --   --   HGB 12.1 11.1* 10.7*  HCT 37.2 33.3* 32.1*  MCV 82.8 82.9 82.8  PLT 357 325 304    Cardiac Enzymes:  Recent Labs Lab 02/27/16 2205 02/28/16 0115 02/28/16 0546 02/28/16 1148 02/28/16 1441  TROPONINI 0.03* 0.03* 0.03* <0.03 0.03*    BNP: Invalid input(s): POCBNP  CBG:  Recent Labs Lab 02/28/16 1611 02/28/16 2215 02/29/16 0813 02/29/16 1144 02/29/16 1552  GLUCAP 144* 209* 228* 244* 158*    Microbiology: Results for orders placed or performed during the hospital encounter of 02/27/16  MRSA PCR Screening     Status: None   Collection Time: 02/28/16  2:21 AM  Result Value Ref Range Status   MRSA by PCR NEGATIVE NEGATIVE Final    Comment:        The GeneXpert MRSA Assay (FDA approved for NASAL specimens only), is one component of a comprehensive MRSA colonization surveillance program. It is not intended to diagnose MRSA infection nor to guide or monitor treatment for MRSA infections.     Coagulation Studies:  Recent Labs  02/28/16 0115  LABPROT 13.8  INR 1.06    Urinalysis:  Recent Labs  02/27/16 2250  COLORURINE YELLOW*  LABSPEC 1.014  PHURINE 7.0  GLUCOSEU >=500*  HGBUR NEGATIVE  BILIRUBINUR  NEGATIVE  KETONESUR 5*  PROTEINUR >=300*  NITRITE NEGATIVE  LEUKOCYTESUR NEGATIVE      Imaging: Ct Angio Chest Aorta W And/or Wo Contrast  Result Date: 02/28/2016 CLINICAL DATA:  Severe epigastric pain radiating to the back with nausea she has a history of renal artery thrombosis EXAM: CT ANGIOGRAPHY CHEST, ABDOMEN AND PELVIS TECHNIQUE: Multidetector CT imaging through the chest, abdomen and pelvis was performed using the standard protocol during bolus administration of intravenous contrast. Multiplanar reconstructed images and MIPs were obtained and reviewed to evaluate the vascular anatomy. CONTRAST:  100 mL Isovue 370 intravenous COMPARISON:  CT abdomen 02/06/2016 FINDINGS: CTA CHEST FINDINGS Cardiovascular: There is no dissection involving the ascending or descending thoracic aorta. There is common origin of the left common carotid artery and the right brachiocephalic artery. Left subclavian artery has its takeoff from the distal arch which is left-sided. There is atherosclerosis of the aorta. There are post CABG changes present. Central pulmonary arteries demonstrate no filling defects. There is coronary artery calcification. There is an oval filling defect within the left atrial appendage, series 6, image number 38 which is suspicious for a thrombus. The heart is slightly enlarged. There is no significant pericardial effusion. Mediastinum/Nodes: There are mildly enlarged mediastinal lymph nodes. A node anterior to the aortic arch measures 1.3 cm. A right paratracheal lymph node measures 1.4 cm. No hilar adenopathy. Imaged thyroid gland within normal limits. Trachea is midline. Esophagus grossly unremarkable. Lungs/Pleura: Hazy attenuation is present bilaterally, which may represent diffuse atelectasis or mild edema, there is suggestion of mild septal thickening. There are small bilateral pleural effusions present. There is no acute consolidation. Musculoskeletal: There are degenerative changes.  Post sternotomy changes. No acute osseous abnormality. Review of the MIP images confirms the above findings. CTA ABDOMEN AND PELVIS FINDINGS VASCULAR Aorta: Atherosclerosis and mural thrombus is present within the abdominal aorta. No discrete aneurysm is visualized. There is a small linear density within the distal abdominal aorta just above the bifurcation which may represent a small intimal flap, series 6, image number 124. 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 SMA: Calcification and plaque are present at the origin of the SMA with mild, less than 50% stenosis suggested. Renals: The right renal artery is occluded at its origin. Moderate severe stenosis suggested at the origin of the left renal artery. No definitive thrombus is visualized within the renal artery. IMA: Calcifications and thrombus present at the origin of the IMA, IMA is patent after the origin. Inflow: Moderate atherosclerotic calcification and mural thrombus of the distal aorta. Scattered atherosclerotic calcification within the right external iliac artery. Severe stenosis of the proximal SFA on the right with eventual occlusion of the proximal to mid superficial femoral artery. Mild atherosclerotic calcification of the left external iliac artery. Mild calcification at the left common femoral artery. There is patency of the left superficial femoral artery proximally. Veins: Suboptimally evaluated on arterial study Review of the MIP images confirms the above findings. NON-VASCULAR Hepatobiliary: There is no focal hepatic abnormality visualized. No calcified gallstones. No biliary dilatation. Pancreas: Unremarkable. No pancreatic ductal dilatation or surrounding inflammatory changes. Spleen: Normal in size without focal abnormality. Adrenals/Urinary Tract: The adrenal glands are within normal limits. Again visualized is hypoperfusion of the right kidney consistent with  infarction, renal size has decreased compared to  the prior study. Now seen is wedge-shaped hypodensity within the left kidney consistent with left renal infarction. The urinary bladder is unremarkable. Stomach/Bowel: Stomach is nonenlarged. There is no dilated small bowel. No colon wall thickening. There is diverticular disease without acute inflammation. Lymphatic: No significantly enlarged lymph nodes. Reproductive: Probable partially calcified uterine fibroid. No adnexal mass. Other: Trace free fluid in the pelvis.  No free air. 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 Review of the MIP images confirms the above findings. IMPRESSION: 1. No evidence for acute dissection involving the ascending or descending thoracic aorta. Suspect short segment focal dissection involving the distal abdominal aorta just above the bifurcation. 2. Oval filling defect within the left atrial appendage is suspicious for thrombus. Suggest correlation with echocardiography. 3. Hypoperfused right kidney with occluded right renal artery. Right kidney has decreased in size compared to the prior CT scan. New wedge-shaped peripheral hypodensities in the left kidney, consistent with acute left renal infarctions. There is severe stenosis at the origin of the left renal artery. 4. 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 5. 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 Severe stenosis/occlusion of the imaged portions of the right proximal superficial femoral artery. Critical Value/emergent results were called by telephone at the time of interpretation on 02/28/2016 at 12:26 am to Dr. Manson Passey, who verbally acknowledged these results. Electronically Signed   By: Jasmine Pang M.D.   On: 02/28/2016 00:26   Ct Angio Abd/pel W And/or Wo Contrast  Result Date: 02/28/2016 CLINICAL DATA:  Severe epigastric pain radiating to the back with nausea she has a history of renal artery thrombosis EXAM: CT ANGIOGRAPHY CHEST, ABDOMEN AND  PELVIS TECHNIQUE: Multidetector CT imaging through the chest, abdomen and pelvis was performed using the standard protocol during bolus administration of intravenous contrast. Multiplanar reconstructed images and MIPs were obtained and reviewed to evaluate the vascular anatomy. CONTRAST:  100 mL Isovue 370 intravenous COMPARISON:  CT abdomen 02/06/2016 FINDINGS: CTA CHEST FINDINGS Cardiovascular: There is no dissection involving the ascending or descending thoracic aorta. There is common origin of the left common carotid artery and the right brachiocephalic artery. Left subclavian artery has its takeoff from the distal arch which is left-sided. There is atherosclerosis of the aorta. There are post CABG changes present. Central pulmonary arteries demonstrate no filling defects. There is coronary artery calcification. There is an oval filling defect within the left atrial appendage, series 6, image number 38 which is suspicious for a thrombus. The heart is slightly enlarged. There is no significant pericardial effusion. Mediastinum/Nodes: There are mildly enlarged mediastinal lymph nodes. A node anterior to the aortic arch measures 1.3 cm. A right paratracheal lymph node measures 1.4 cm. No hilar adenopathy. Imaged thyroid gland within normal limits. Trachea is midline. Esophagus grossly unremarkable. Lungs/Pleura: Hazy attenuation is present bilaterally, which may represent diffuse atelectasis or mild edema, there is suggestion of mild septal thickening. There are small bilateral pleural effusions present. There is no acute consolidation. Musculoskeletal: There are degenerative changes. Post sternotomy changes. No acute osseous abnormality. Review of the MIP images confirms the above findings. CTA ABDOMEN AND PELVIS FINDINGS VASCULAR Aorta: Atherosclerosis and mural thrombus is present within the abdominal aorta. No discrete aneurysm is visualized. There is a small linear density within the distal abdominal aorta  just above the bifurcation which may represent a small intimal flap, series 6, image number 124. Celiac: There is severe  stenosis of the origin of the celiac artery. SMA: Calcification and plaque are present at the origin of the SMA with mild, less than 50% stenosis suggested. Renals: The right renal artery is occluded at its origin. Moderate severe stenosis suggested at the origin of the left renal artery. No definitive thrombus is visualized within the renal artery. IMA: Calcifications and thrombus present at the origin of the IMA, IMA is patent after the origin. Inflow: Moderate atherosclerotic calcification and mural thrombus of the distal aorta. Scattered atherosclerotic calcification within the right external iliac artery. Severe stenosis of the proximal SFA on the right with eventual occlusion of the proximal to mid superficial femoral artery. Mild atherosclerotic calcification of the left external iliac artery. Mild calcification at the left common femoral artery. There is patency of the left superficial femoral artery proximally. Veins: Suboptimally evaluated on arterial study Review of the MIP images confirms the above findings. NON-VASCULAR Hepatobiliary: There is no focal hepatic abnormality visualized. No calcified gallstones. No biliary dilatation. Pancreas: Unremarkable. No pancreatic ductal dilatation or surrounding inflammatory changes. Spleen: Normal in size without focal abnormality. Adrenals/Urinary Tract: The adrenal glands are within normal limits. Again visualized is hypoperfusion of the right kidney consistent with infarction, renal size has decreased compared to the prior study. Now seen is wedge-shaped hypodensity within the left kidney consistent with left renal infarction. The urinary bladder is unremarkable. Stomach/Bowel: Stomach is nonenlarged. There is no dilated small bowel. No colon wall thickening. There is diverticular disease without acute inflammation. Lymphatic: No  significantly enlarged lymph nodes. Reproductive: Probable partially calcified uterine fibroid. No adnexal mass. Other: Trace free fluid in the pelvis.  No free air. 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 Review of the MIP images confirms the above findings. IMPRESSION: 1. No evidence for acute dissection involving the ascending or descending thoracic aorta. Suspect short segment focal dissection involving the distal abdominal aorta just above the bifurcation. 2. Oval filling defect within the left atrial appendage is suspicious for thrombus. Suggest correlation with echocardiography. 3. Hypoperfused right kidney with occluded right renal artery. Right kidney has decreased in size compared to the prior CT scan. New wedge-shaped peripheral hypodensities in the left kidney, consistent with acute left renal infarctions. There is severe stenosis at the origin of the left renal artery. 4. 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 5. 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 Severe stenosis/occlusion of the imaged portions of the right proximal superficial femoral artery. Critical Value/emergent results were called by telephone at the time of interpretation on 02/28/2016 at 12:26 am to Dr. Manson Passey, who verbally acknowledged these results. Electronically Signed   By: Jasmine Pang M.D.   On: 02/28/2016 00:26      Assessment & Plan: Pt is a 75 y.o. female with a PMHx of Paroxysmal atrial fibrillation, congestive heart failure, coronary artery disease, diabetes mellitus type 2, hyperlipidemia, hypertension, who was admitted to [facility] on 02/27/2016 for evaluation of  left-sided flank pain. The patient had CT scan of the abdomen and pelvis with contrast upon admission. This showed a hyperperfused right kidney which had decreased in size as compared to the prior recent examination. In addition she was found to have peripheral hypodensities within the  left kidney and left renal artery stenosis. This was consistent with left renal infarctions.   1.  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 2.  Malignant hypertension. 3.  Atrial fibrillation with  left atrial appendage thrombus. 4.  Left renal infarction.  Plan:  The patient presents with a very interesting and complex issue. It appears that some of her issue is chronic. She appears to have a shrinking right kidney as compared to her recent imaging study. This is likely secondary to an occluded right renal artery.  In addition the patient now has signs of acute left renal infarction with left renal artery stenosis. I have discussed the case in depth with Dr. Gilda CreaseSchnier. The first order of the plan is to repeat renal function testing today. The patient is likely to continue to experience renal function decline. She will likely need some additional imaging studies including an angiogram to see if any of her renal function could potentially be salvaged. The current plan has been to provide adequate blood pressure control with cardene as well as anticoagulation to see if renal function can be stabilized. There are obvious risks with proceeding with angiogram however these risks are unlikely to be unchanged if we allow the natural course of her disease to progress which will likely lead to progressive renal dysfunction. As such we would be agreeable to renal angiogram with possible intervention if the patient and her family consent to this. We will continue to monitor the patient's case very closely with you.  Thanks for consultation.

## 2016-02-29 NOTE — Progress Notes (Signed)
ANTICOAGULATION CONSULT NOTE - Initial Consult  Pharmacy Consult for heparin Indication: Renal infarct   No Known Allergies  Patient Measurements: Height: 5\' 4"  (162.6 cm) Weight: 158 lb (71.7 kg) IBW/kg (Calculated) : 54.7 Heparin Dosing Weight: 69.4 kg  Vital Signs: Temp: 98.5 F (36.9 C) (12/20 1400) Temp Source: Oral (12/20 1400) BP: 170/75 (12/20 1600) Pulse Rate: 67 (12/20 1600)  Labs:  Recent Labs  02/27/16 2205  02/28/16 0115 02/28/16 0546 02/28/16 0844 02/28/16 1148 02/28/16 1441 02/29/16 0021 02/29/16 0844  HGB 12.1  --   --   --  11.1*  --   --  10.7*  --   HCT 37.2  --   --   --  33.3*  --   --  32.1*  --   PLT 357  --   --   --  325  --   --  304  --   APTT  --   < > 31  --  92*  --  54* 89*  --   LABPROT  --   --  13.8  --   --   --   --   --   --   INR  --   --  1.06  --   --   --   --   --   --   HEPARINUNFRC  --   < > <0.10*  --   --   --  0.52 0.60 0.62  CREATININE 1.12*  --   --   --  1.30*  --   --   --   --   TROPONINI 0.03*  --  0.03* 0.03*  --  <0.03 0.03*  --   --   < > = values in this interval not displayed.  Estimated Creatinine Clearance: 36.3 mL/min (by C-G formula based on SCr of 1.3 mg/dL (H)).   Medical History: Past Medical History:  Diagnosis Date  . Afib (HCC)   . Anxiety   . Asthma   . CHF (congestive heart failure) (HCC)   . Coronary artery disease   . Diabetes mellitus without complication (HCC)   . Hyperlipidemia   . Hypertension     Medications:  Infusions:  . heparin 1,200 Units/hr (02/29/16 0700)  . niCARDipine 3 mg/hr (02/29/16 1500)    Assessment: 75 yof cc hypertension presents to ED after recent hospital visit for renal thromboembolism/infarct during which time she started Xarelto. Baseline anti-Xa level < 0.1 suggestive of noncompliance. Heparin currently infusing at 1200 units/hr.   Goal of Therapy:  aPTT 66 to 102 seconds Monitor platelets by anticoagulation protocol: Yes   Plan:  Will continue  at current rate. Will recheck anti-Xa with am labs.   Pharmacy will continue to monitor and adjust per consult.    Robin [name] 02/29/2016,4:57 PM

## 2016-02-29 NOTE — Progress Notes (Signed)
Notified Dr. Nemiah Commander that patient has been off Nicardipine drip since early am around 0530.  Patient is only receiving metoprolol 50mg  BID and BP around 180's-190's off drip- patient also unsteady on feet and would benefit from PT.  MD ordered norvasc and PT.

## 2016-02-29 NOTE — Care Management (Signed)
Robin [name] with DSS/APS will visit with patient today. ICU updated.
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
## 2016-02-29 NOTE — Progress Notes (Signed)
Patient has refused breathing treatments. She states she only takes when needed at home. States every 4 hours is to much and she will request one if she needs it

## 2016-02-29 NOTE — Progress Notes (Signed)
ANTICOAGULATION CONSULT NOTE - Initial Consult  Pharmacy Consult for heparin Indication: Renal infarct   No Known Allergies  Patient Measurements: Height: 5\' 4"  (162.6 cm) Weight: 158 lb (71.7 kg) IBW/kg (Calculated) : 54.7 Heparin Dosing Weight: 69.4 kg  Vital Signs: Temp: 98.2 F (36.8 C) (12/19 2000) Temp Source: Oral (12/19 2000) BP: 179/78 (12/20 0000) Pulse Rate: 60 (12/20 0000)  Labs:  Recent Labs  02/27/16 2205  02/28/16 0115 02/28/16 0546 02/28/16 0844 02/28/16 1148 02/28/16 1441 02/29/16 0021  HGB 12.1  --   --   --  11.1*  --   --  10.7*  HCT 37.2  --   --   --  33.3*  --   --  32.1*  PLT 357  --   --   --  325  --   --  304  APTT  --   < > 31  --  92*  --  54* 89*  LABPROT  --   --  13.8  --   --   --   --   --   INR  --   --  1.06  --   --   --   --   --   HEPARINUNFRC  --   --  <0.10*  --   --   --  0.52 0.60  CREATININE 1.12*  --   --   --  1.30*  --   --   --   TROPONINI 0.03*  --  0.03* 0.03*  --  <0.03 0.03*  --   < > = values in this interval not displayed.  Estimated Creatinine Clearance: 36.3 mL/min (by C-G formula based on SCr of 1.3 mg/dL (H)).   Medical History: Past Medical History:  Diagnosis Date  . Afib (HCC)   . Anxiety   . Asthma   . CHF (congestive heart failure) (HCC)   . Coronary artery disease   . Diabetes mellitus without complication (HCC)   . Hyperlipidemia   . Hypertension     Medications:  Infusions:  . heparin 1,200 Units/hr (02/28/16 2013)  . niCARDipine Stopped (02/28/16 2128)    Assessment: 75 yof cc hypertension presents to ED after recent hospital visit for renal thromboembolism/infarct during which time she started Xarelto. Baseline anti-Xa level < 0.1 suggestive of noncompliance. Heparin currently infusing at 1100 units/hr.   Goal of Therapy:  aPTT 66 to 102 seconds Monitor platelets by anticoagulation protocol: Yes   Plan:  Anti-Xa level and aPTT currently not correlating. Will increase heparin to  1200 units/hr and obtain follow-up anti-Xa level and aPTT at 2330. Will manage via anti-Xa levels once levels correlate.    12/20 0021 HL and aPTT are therapeutic and correlate. Continue current rate. Will recheck HL in 8 hours.  Pharmacy will continue to monitor and adjust per consult.    Carola Frost, Pharm.D., BCPS Clinical Pharmacist 02/29/2016,12:56 AM

## 2016-02-29 NOTE — Progress Notes (Signed)
Inpatient Diabetes Program Recommendations  AACE/ADA: New Consensus Statement on Inpatient Glycemic Control (2015)  Target Ranges:  Prepandial:   less than 140 mg/dL      Peak postprandial:   less than 180 mg/dL (1-2 hours)      Critically ill patients:  140 - 180 mg/dL   Lab Results  Component Value Date   GLUCAP 244 (H) 02/29/2016   HGBA1C 8.7 (H) 02/15/2016    Review of Glycemic Control:  Results for Robin [name], Robin [name] (MRN [id number]) as of 02/29/2016 12:08  Ref. Range 02/28/2016 11:20 02/28/2016 16:11 02/28/2016 22:15 02/29/2016 08:13 02/29/2016 11:44  Glucose-Capillary Latest Ref Range: 65 - 99 mg/dL 390 (H) 300 (H) 923 (H) 228 (H) 244 (H)    Inpatient Diabetes Program Recommendations:    Consider increasing Lantus to 15 units daily.  Based on patient's A1C, patient will need oral medications for diabetes at d/c.    Will follow.  Thanks, Beryl Meager, RN, BC-ADM Inpatient Diabetes Coordinator Pager [phone]

## 2016-02-29 NOTE — Progress Notes (Signed)
[employer] - [place] [facility]   PATIENT NAME: Robin [name]    MR#:  [id number]  DATE OF BIRTH:  April 02, 1940  SUBJECTIVE:  CHIEF COMPLAINT:   Chief Complaint  Patient presents with  . Hypertension   - feels short of breath, anxious - BP elevated, on nicardipine drip. Also on heparin drip for renal infarct  REVIEW OF SYSTEMS:  Review of Systems  Constitutional: Positive for malaise/fatigue. Negative for chills and fever.  HENT: Negative for ear discharge, ear pain, hearing loss and nosebleeds.   Eyes: Negative for blurred vision, double vision and photophobia.  Respiratory: Positive for shortness of breath and wheezing. Negative for cough and hemoptysis.   Cardiovascular: Negative for chest pain, palpitations, orthopnea and leg swelling.  Gastrointestinal: Positive for abdominal pain. Negative for constipation, diarrhea, heartburn, melena, nausea and vomiting.  Genitourinary: Negative for dysuria.  Musculoskeletal: Negative for myalgias.  Skin: Negative for rash.  Neurological: Negative for dizziness, sensory change, speech change, focal weakness and headaches.  Endo/Heme/Allergies: Does not bruise/bleed easily.  Psychiatric/Behavioral: Negative for depression. The patient is nervous/anxious.     DRUG ALLERGIES:  No Known Allergies  VITALS:  Blood pressure (!) 169/56, pulse 71, temperature 98.5 F (36.9 C), temperature source Oral, resp. rate (!) 23, height 5\' 4"  (1.626 m), weight 71.7 kg (158 lb), SpO2 94 %.  PHYSICAL EXAMINATION:  Physical Exam  GENERAL:  75 y.o.-year-old patient lying in the bed with no acute distress.  EYES: Pupils equal, round, reactive to light and accommodation. No scleral icterus. Extraocular muscles intact.  HEENT: Head atraumatic, normocephalic. Oropharynx and nasopharynx clear.  NECK:  Supple, no jugular venous distention. No thyroid enlargement, no tenderness.  LUNGS: Scant breath sounds bilaterally, very tight and  auscultation. Scattered expiratory wheezes heard. No rales,rhonchi or crepitation. No use of accessory muscles of respiration.  CARDIOVASCULAR: S1, S2 normal. No murmurs, rubs, or gallops.  ABDOMEN: Soft, nontender, nondistended. Bowel sounds present. No organomegaly or mass.  EXTREMITIES: No pedal edema, cyanosis, or clubbing.  NEUROLOGIC: Cranial nerves II through XII are intact. Muscle strength 5/5 in all extremities. Sensation intact. Gait not checked.  PSYCHIATRIC: The patient is alert and oriented x 3. Very anxious. SKIN: No obvious rash, lesion, or ulcer.    LABORATORY PANEL:   CBC  Recent Labs Lab 02/29/16 0021  WBC 16.6*  HGB 10.7*  HCT 32.1*  PLT 304   ------------------------------------------------------------------------------------------------------------------  Chemistries   Recent Labs Lab 02/27/16 2205 02/28/16 0844  NA 137 134*  K 4.1 4.1  CL 102 103  CO2 25 23  GLUCOSE 259* 336*  BUN 23* 22*  CREATININE 1.12* 1.30*  CALCIUM 8.8* 8.0*  AST 22  --   ALT 16  --   ALKPHOS 114  --   BILITOT 0.5  --    ------------------------------------------------------------------------------------------------------------------  Cardiac Enzymes  Recent Labs Lab 02/28/16 1441  TROPONINI 0.03*   ------------------------------------------------------------------------------------------------------------------  RADIOLOGY:  Ct Angio Chest Aorta W And/or Wo Contrast  Result Date: 02/28/2016 CLINICAL DATA:  Severe epigastric pain radiating to the back with nausea she has a history of renal artery thrombosis EXAM: CT ANGIOGRAPHY CHEST, ABDOMEN AND PELVIS TECHNIQUE: Multidetector CT imaging through the chest, abdomen and pelvis was performed using the standard protocol during bolus administration of intravenous contrast. Multiplanar reconstructed images and MIPs were obtained and reviewed to evaluate the vascular anatomy. CONTRAST:  100 mL Isovue 370 intravenous  COMPARISON:  CT abdomen 02/06/2016 FINDINGS: CTA CHEST FINDINGS Cardiovascular: There is no dissection  involving the ascending or descending thoracic aorta. There is common origin of the left common carotid artery and the right brachiocephalic artery. Left subclavian artery has its takeoff from the distal arch which is left-sided. There is atherosclerosis of the aorta. There are post CABG changes present. Central pulmonary arteries demonstrate no filling defects. There is coronary artery calcification. There is an oval filling defect within the left atrial appendage, series 6, image number 38 which is suspicious for a thrombus. The heart is slightly enlarged. There is no significant pericardial effusion. Mediastinum/Nodes: There are mildly enlarged mediastinal lymph nodes. A node anterior to the aortic arch measures 1.3 cm. A right paratracheal lymph node measures 1.4 cm. No hilar adenopathy. Imaged thyroid gland within normal limits. Trachea is midline. Esophagus grossly unremarkable. Lungs/Pleura: Hazy attenuation is present bilaterally, which may represent diffuse atelectasis or mild edema, there is suggestion of mild septal thickening. There are small bilateral pleural effusions present. There is no acute consolidation. Musculoskeletal: There are degenerative changes. Post sternotomy changes. No acute osseous abnormality. Review of the MIP images confirms the above findings. CTA ABDOMEN AND PELVIS FINDINGS VASCULAR Aorta: Atherosclerosis and mural thrombus is present within the abdominal aorta. No discrete aneurysm is visualized. There is a small linear density within the distal abdominal aorta just above the bifurcation which may represent a small intimal flap, series 6, image number 124. 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 SMA: Calcification and plaque are present at the origin of the SMA with mild, less than 50% stenosis suggested. Renals: The right renal artery is occluded at  its origin. Moderate severe stenosis suggested at the origin of the left renal artery. No definitive thrombus is visualized within the renal artery. IMA: Calcifications and thrombus present at the origin of the IMA, IMA is patent after the origin. Inflow: Moderate atherosclerotic calcification and mural thrombus of the distal aorta. Scattered atherosclerotic calcification within the right external iliac artery. Severe stenosis of the proximal SFA on the right with eventual occlusion of the proximal to mid superficial femoral artery. Mild atherosclerotic calcification of the left external iliac artery. Mild calcification at the left common femoral artery. There is patency of the left superficial femoral artery proximally. Veins: Suboptimally evaluated on arterial study Review of the MIP images confirms the above findings. NON-VASCULAR Hepatobiliary: There is no focal hepatic abnormality visualized. No calcified gallstones. No biliary dilatation. Pancreas: Unremarkable. No pancreatic ductal dilatation or surrounding inflammatory changes. Spleen: Normal in size without focal abnormality. Adrenals/Urinary Tract: The adrenal glands are within normal limits. Again visualized is hypoperfusion of the right kidney consistent with infarction, renal size has decreased compared to the prior study. Now seen is wedge-shaped hypodensity within the left kidney consistent with left renal infarction. The urinary bladder is unremarkable. Stomach/Bowel: Stomach is nonenlarged. There is no dilated small bowel. No colon wall thickening. There is diverticular disease without acute inflammation. Lymphatic: No significantly enlarged lymph nodes. Reproductive: Probable partially calcified uterine fibroid. No adnexal mass. Other: Trace free fluid in the pelvis.  No free air. 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 Review of the MIP images confirms the above findings. IMPRESSION: 1. No evidence  for acute dissection involving the ascending or descending thoracic aorta. Suspect short segment focal dissection involving the distal abdominal aorta just above the bifurcation. 2. Oval filling defect within the left atrial appendage is suspicious for thrombus. Suggest correlation with echocardiography. 3. Hypoperfused right kidney with occluded right  renal artery. Right kidney has decreased in size compared to the prior CT scan. New wedge-shaped peripheral hypodensities in the left kidney, consistent with acute left renal infarctions. There is severe stenosis at the origin of the left renal artery. 4. 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 5. 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 Severe stenosis/occlusion of the imaged portions of the right proximal superficial femoral artery. Critical Value/emergent results were called by telephone at the time of interpretation on 02/28/2016 at 12:26 am to Dr. Manson Passey, who verbally acknowledged these results. Electronically Signed   By: Jasmine Pang M.D.   On: 02/28/2016 00:26   Ct Angio Abd/pel W And/or Wo Contrast  Result Date: 02/28/2016 CLINICAL DATA:  Severe epigastric pain radiating to the back with nausea she has a history of renal artery thrombosis EXAM: CT ANGIOGRAPHY CHEST, ABDOMEN AND PELVIS TECHNIQUE: Multidetector CT imaging through the chest, abdomen and pelvis was performed using the standard protocol during bolus administration of intravenous contrast. Multiplanar reconstructed images and MIPs were obtained and reviewed to evaluate the vascular anatomy. CONTRAST:  100 mL Isovue 370 intravenous COMPARISON:  CT abdomen 02/06/2016 FINDINGS: CTA CHEST FINDINGS Cardiovascular: There is no dissection involving the ascending or descending thoracic aorta. There is common origin of the left common carotid artery and the right brachiocephalic artery. Left subclavian artery has its takeoff from the distal arch which is left-sided. There is  atherosclerosis of the aorta. There are post CABG changes present. Central pulmonary arteries demonstrate no filling defects. There is coronary artery calcification. There is an oval filling defect within the left atrial appendage, series 6, image number 38 which is suspicious for a thrombus. The heart is slightly enlarged. There is no significant pericardial effusion. Mediastinum/Nodes: There are mildly enlarged mediastinal lymph nodes. A node anterior to the aortic arch measures 1.3 cm. A right paratracheal lymph node measures 1.4 cm. No hilar adenopathy. Imaged thyroid gland within normal limits. Trachea is midline. Esophagus grossly unremarkable. Lungs/Pleura: Hazy attenuation is present bilaterally, which may represent diffuse atelectasis or mild edema, there is suggestion of mild septal thickening. There are small bilateral pleural effusions present. There is no acute consolidation. Musculoskeletal: There are degenerative changes. Post sternotomy changes. No acute osseous abnormality. Review of the MIP images confirms the above findings. CTA ABDOMEN AND PELVIS FINDINGS VASCULAR Aorta: Atherosclerosis and mural thrombus is present within the abdominal aorta. No discrete aneurysm is visualized. There is a small linear density within the distal abdominal aorta just above the bifurcation which may represent a small intimal flap, series 6, image number 124. 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 SMA: Calcification and plaque are present at the origin of the SMA with mild, less than 50% stenosis suggested. Renals: The right renal artery is occluded at its origin. Moderate severe stenosis suggested at the origin of the left renal artery. No definitive thrombus is visualized within the renal artery. IMA: Calcifications and thrombus present at the origin of the IMA, IMA is patent after the origin. Inflow: Moderate atherosclerotic calcification and mural thrombus of the distal aorta.  Scattered atherosclerotic calcification within the right external iliac artery. Severe stenosis of the proximal SFA on the right with eventual occlusion of the proximal to mid superficial femoral artery. Mild atherosclerotic calcification of the left external iliac artery. Mild calcification at the left common femoral artery. There is patency of the left superficial femoral artery proximally. Veins: Suboptimally evaluated on arterial study Review of the  MIP images confirms the above findings. NON-VASCULAR Hepatobiliary: There is no focal hepatic abnormality visualized. No calcified gallstones. No biliary dilatation. Pancreas: Unremarkable. No pancreatic ductal dilatation or surrounding inflammatory changes. Spleen: Normal in size without focal abnormality. Adrenals/Urinary Tract: The adrenal glands are within normal limits. Again visualized is hypoperfusion of the right kidney consistent with infarction, renal size has decreased compared to the prior study. Now seen is wedge-shaped hypodensity within the left kidney consistent with left renal infarction. The urinary bladder is unremarkable. Stomach/Bowel: Stomach is nonenlarged. There is no dilated small bowel. No colon wall thickening. There is diverticular disease without acute inflammation. Lymphatic: No significantly enlarged lymph nodes. Reproductive: Probable partially calcified uterine fibroid. No adnexal mass. Other: Trace free fluid in the pelvis.  No free air. 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 Review of the MIP images confirms the above findings. IMPRESSION: 1. No evidence for acute dissection involving the ascending or descending thoracic aorta. Suspect short segment focal dissection involving the distal abdominal aorta just above the bifurcation. 2. Oval filling defect within the left atrial appendage is suspicious for thrombus. Suggest correlation with echocardiography. 3. Hypoperfused right kidney  with occluded right renal artery. Right kidney has decreased in size compared to the prior CT scan. New wedge-shaped peripheral hypodensities in the left kidney, consistent with acute left renal infarctions. There is severe stenosis at the origin of the left renal artery. 4. 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 5. 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 Severe stenosis/occlusion of the imaged portions of the right proximal superficial femoral artery. Critical Value/emergent results were called by telephone at the time of interpretation on 02/28/2016 at 12:26 am to Dr. Manson Passey, who verbally acknowledged these results. Electronically Signed   By: Jasmine Pang M.D.   On: 02/28/2016 00:26    EKG:   Orders placed or performed during the hospital encounter of 02/27/16  . EKG 12-Lead  . EKG 12-Lead    ASSESSMENT AND PLAN:   75 year old female with past medical history significant for atrial fibrillation with several embolic events, discontinued her Xarelto as an outpatient, recent admission with right renal artery stenosis, depression and anxiety, CAD, hypertension and diabetes presents to hospital secondary to left flank pain and noted to have left renal infarct.  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 -Appreciate vascular surgery consult. Currently on heparin drip. -Also recent right renal artery thrombosis - Monitor renal function again tomorrow. If stable, might need an angiography and left renal artery stenosis. -Pain is improved now.  #2 atrial fibrillation-rate controlled at this time. Importance of anticoagulation has been discussed again. Currently on heparin drip.  #3 hypertensive urgency-especially with the renal artery stenosis/ occlusion, blood pressure has been elevated  -Continue nicardipine drip. On oral metoprolol and Norvasc. Add medications as needed.  #4 acute dyspnea-needing 1 L  oxygen. Secondary to reactive airway disease. -One dose of Solu-Medrol loaded and nebs have been ordered.   #5 diabetes mellitus-continue Lantus and also on sliding scale insulin.  #6 DVT prophylaxis- on heparin drip    All the records are reviewed and case discussed with Care Management/Social Workerr. Management plans discussed with the patient, family and they are in agreement.  CODE STATUS: Full code  TOTAL CRITICAL CARE TIME SPENT IN TAKING CARE OF THIS PATIENT: 39 minutes.   POSSIBLE D/C IN 3-5 DAYS, DEPENDING ON CLINICAL CONDITION.   Enid Baas  M.D on 02/29/2016 at 3:54 PM  Between 7am to 6pm - Pager - [phone]  After 6pm go to [email or web address] - password [email or web address]  [facility] [place] [facility]  Office  [phone]  CC: Primary care physician; Leanna SatoMILES,LINDA M, MD

## 2016-03-01 DIAGNOSIS — I701 Atherosclerosis of renal artery: Secondary | ICD-10-CM

## 2016-03-01 LAB — BASIC METABOLIC PANEL
ANION GAP: 8 (ref 5–15)
BUN: 25 mg/dL — ABNORMAL HIGH (ref 6–20)
CALCIUM: 8.5 mg/dL — AB (ref 8.9–10.3)
CO2: 26 mmol/L (ref 22–32)
Chloride: 104 mmol/L (ref 101–111)
Creatinine, Ser: 1.38 mg/dL — ABNORMAL HIGH (ref 0.44–1.00)
GFR, EST AFRICAN AMERICAN: 42 mL/min — AB (ref >60)
GFR, EST NON AFRICAN AMERICAN: 36 mL/min — AB (ref >60)
GLUCOSE: 312 mg/dL — AB (ref 65–99)
POTASSIUM: 4 mmol/L (ref 3.5–5.1)
Sodium: 138 mmol/L (ref 135–145)

## 2016-03-01 LAB — GLUCOSE, CAPILLARY
GLUCOSE-CAPILLARY: 304 mg/dL — AB (ref 65–99)
GLUCOSE-CAPILLARY: 290 mg/dL — AB (ref 65–99)
GLUCOSE-CAPILLARY: 370 mg/dL — AB (ref 65–99)
Glucose-Capillary: 217 mg/dL — ABNORMAL HIGH (ref 65–99)
Glucose-Capillary: 252 mg/dL — ABNORMAL HIGH (ref 65–99)

## 2016-03-01 LAB — CBC
HEMATOCRIT: 30.2 % — AB (ref 35.0–47.0)
HEMOGLOBIN: 10 g/dL — AB (ref 12.0–16.0)
MCH: 27.3 pg (ref 26.0–34.0)
MCHC: 33 g/dL (ref 32.0–36.0)
MCV: 82.8 fL (ref 80.0–100.0)
Platelets: 267 10*3/uL (ref 150–440)
RBC: 3.65 MIL/uL — AB (ref 3.80–5.20)
RDW: 14.2 % (ref 11.5–14.5)
WBC: 17.6 10*3/uL — ABNORMAL HIGH (ref 3.6–11.0)

## 2016-03-01 LAB — HEPARIN LEVEL (UNFRACTIONATED): Heparin Unfractionated: 0.48 IU/mL (ref 0.30–0.70)

## 2016-03-01 MED ORDER — CEFAZOLIN IN D5W 1 GM/50ML IV SOLN
1.0000 g | INTRAVENOUS | Status: AC
  Filled 2016-03-01: qty 50

## 2016-03-01 MED ORDER — HYDRALAZINE HCL 50 MG PO TABS
50.0000 mg | ORAL_TABLET | Freq: Three times a day (TID) | ORAL | Status: DC
  Administered 2016-03-01 – 2016-03-04 (×9): 50 mg via ORAL
  Filled 2016-03-01 (×9): qty 1

## 2016-03-01 MED ORDER — CLONIDINE HCL 0.1 MG PO TABS
0.2000 mg | ORAL_TABLET | Freq: Three times a day (TID) | ORAL | Status: DC | PRN
  Administered 2016-03-01: 0.2 mg via ORAL
  Filled 2016-03-01: qty 2

## 2016-03-01 MED ORDER — INSULIN GLARGINE 100 UNIT/ML ~~LOC~~ SOLN
15.0000 [IU] | Freq: Every day | SUBCUTANEOUS | Status: DC
  Administered 2016-03-03: 15 [IU] via SUBCUTANEOUS
  Filled 2016-03-01 (×4): qty 0.15

## 2016-03-01 MED ORDER — HYDRALAZINE HCL 20 MG/ML IJ SOLN: 10.0000 mg | Freq: Four times a day (QID) | INTRAMUSCULAR | Status: DC | PRN

## 2016-03-01 MED ORDER — INSULIN ASPART 100 UNIT/ML ~~LOC~~ SOLN
0.0000 [IU] | Freq: Three times a day (TID) | SUBCUTANEOUS | Status: DC
  Administered 2016-03-01: 15 [IU] via SUBCUTANEOUS
  Administered 2016-03-02 – 2016-03-03 (×2): 5 [IU] via SUBCUTANEOUS
  Administered 2016-03-03: 11 [IU] via SUBCUTANEOUS
  Administered 2016-03-04: 3 [IU] via SUBCUTANEOUS
  Filled 2016-03-01: qty 5
  Filled 2016-03-01: qty 2
  Filled 2016-03-01: qty 5
  Filled 2016-03-01: qty 11
  Filled 2016-03-01: qty 3
  Filled 2016-03-01: qty 15

## 2016-03-01 NOTE — Progress Notes (Signed)
[facility]  Daily Progress Note   Subjective  -   Flank pain improving denies shortness of breath denies palpitations  Objective Vitals:   03/01/16 1200 03/01/16 1300 03/01/16 1400 03/01/16 1500  BP: (!) 189/62 (!) 174/144 (!) 186/62 (!) 163/77  Pulse: 78 78 76 75  Resp: (!) 21 (!) 24 (!) 21 19  Temp: 98.7 F (37.1 C)     TempSrc:      SpO2: 95% (!) 88% (!) 87% 91%  Weight:      Height:        Intake/Output Summary (Last 24 hours) at 03/01/16 1750 Last data filed at 03/01/16 1200  Gross per 24 hour  Intake           631.67 ml  Output             1125 ml  Net          -493.33 ml    PULM  Normal effort , no use of accessory muscles CV  No JVD, RRR Abd      No distended, nontender VASC  2+ edema bilateral lower extremities; multiple ecchymoses noted bilateral upper extremities  Laboratory CBC    Component Value Date/Time   WBC 17.6 (H) 03/01/2016 0402   HGB 10.0 (L) 03/01/2016 0402   HGB 12.5 02/10/2014 1945   HCT 30.2 (L) 03/01/2016 0402   HCT 40.0 02/10/2014 1945   PLT 267 03/01/2016 0402   PLT 296 02/10/2014 1945    BMET    Component Value Date/Time   NA 138 03/01/2016 0402   NA 141 02/10/2014 1945   K 4.0 03/01/2016 0402   K 3.6 02/10/2014 1945   CL 104 03/01/2016 0402   CL 107 02/10/2014 1945   CO2 26 03/01/2016 0402   CO2 22 02/10/2014 1945   GLUCOSE 312 (H) 03/01/2016 0402   GLUCOSE 246 (H) 02/10/2014 1945   BUN 25 (H) 03/01/2016 0402   BUN 21 (H) 02/10/2014 1945   CREATININE 1.38 (H) 03/01/2016 0402   CREATININE 0.69 02/10/2014 1945   CALCIUM 8.5 (L) 03/01/2016 0402   CALCIUM 8.3 (L) 02/10/2014 1945   GFRNONAA 36 (L) 03/01/2016 0402   GFRNONAA >60 02/10/2014 1945   GFRNONAA >60 11/02/2013 1900   GFRAA 42 (L) 03/01/2016 0402   GFRAA >60 02/10/2014 1945   GFRAA >60 11/02/2013 1900    Assessment/Planning: 1. 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 I documented the stenting process and drew pictures on the dry erase board. I have discussed the situation extensively with Dr. Cherylann RatelLateef who is in agreement with moving forward with left renal artery revascularization. Given the current situation the reinitiation of appropriate blood flow would likely optimize her future results and delay could result in irreparable loss of renal mass. I will plan to move forward tomorrow with left renal artery stenting. 2. 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 I believe that her celiac artery stenosis and is unlikely to cause profound abdominal complaints and given that she does not appear to have similar difficulties on this admission as well as the damage to her renal function no plans regarding treatment of her celiac artery stenosis will be made at this time. 4.  Atrial fibrillation: I have again stressed to the patient the importance of  anticoagulation in the prevention of embolic episodes that could eliminate the remaining small amount of her renal function, cause strokes and even cause limb loss this was all reviewed in detail with the patient. 5.  Hypertension: This will be a very challenging aspect to her care given the extensive damage to her kidneys controlling her hypertension will be very difficult and I agree with parenteral antihypertensives and continuing care within the ICU.    Levora Dredge  03/01/2016, 5:50 PM

## 2016-03-01 NOTE — Care Management (Signed)
This RNCM received a text message from [facility] stating that they had received a referral on this patient too but have not been able to start care with patient. No reason given.

## 2016-03-01 NOTE — Progress Notes (Signed)
ANTICOAGULATION CONSULT NOTE - Initial Consult  Pharmacy Consult for heparin Indication: Renal infarct   No Known Allergies  Patient Measurements: Height: 5\' 4"  (162.6 cm) Weight: 158 lb (71.7 kg) IBW/kg (Calculated) : 54.7 Heparin Dosing Weight: 69.4 kg  Vital Signs: Temp: 98.8 F (37.1 C) (12/21 0400) Temp Source: Oral (12/21 0400) BP: 165/54 (12/21 0400) Pulse Rate: 64 (12/21 0400)  Labs:  Recent Labs  02/28/16 0115 02/28/16 0546 02/28/16 0844 02/28/16 1148 02/28/16 1441 02/29/16 0021 02/29/16 0621 02/29/16 0844 03/01/16 0402  HGB  --   --  11.1*  --   --  10.7*  --   --  10.0*  HCT  --   --  33.3*  --   --  32.1*  --   --  30.2*  PLT  --   --  325  --   --  304  --   --  267  APTT 31  --  92*  --  54* 89*  --   --   --   LABPROT 13.8  --   --   --   --   --   --   --   --   INR 1.06  --   --   --   --   --   --   --   --   HEPARINUNFRC <0.10*  --   --   --  0.52 0.60  --  0.62 0.48  CREATININE  --   --  1.30*  --   --   --  1.19*  --  1.38*  TROPONINI 0.03* 0.03*  --  <0.03 0.03*  --   --   --   --     Estimated Creatinine Clearance: 34.2 mL/min (by C-G formula based on SCr of 1.38 mg/dL (H)).   Medical History: Past Medical History:  Diagnosis Date  . Afib (HCC)   . Anxiety   . Asthma   . CHF (congestive heart failure) (HCC)   . Coronary artery disease   . Diabetes mellitus without complication (HCC)   . Hyperlipidemia   . Hypertension     Medications:  Infusions:  . heparin 1,200 Units/hr (03/01/16 0400)  . niCARDipine Stopped (03/01/16 0100)    Assessment: 75 yof cc hypertension presents to ED after recent hospital visit for renal thromboembolism/infarct during which time she started Xarelto. Baseline anti-Xa level < 0.1 suggestive of noncompliance. Heparin currently infusing at 1200 units/hr.   Goal of Therapy:  aPTT 66 to 102 seconds Monitor platelets by anticoagulation protocol: Yes   Plan:  Will continue at current rate. Will  recheck anti-Xa with am labs.   12/21 0402 HL therapeutic. Continue current rate. Will continue to monitor HL and CBC daily.  Pharmacy will continue to monitor and adjust per consult.    Carola Frost, Pharm.D., BCPS Clinical Pharmacist 03/01/2016,6:18 AM

## 2016-03-01 NOTE — Progress Notes (Signed)
Have talked with patient for last 2 days about home situation and who helps her. Each day I get a different story of how she functions at home.Overheard patient and son's conversation evening of 12/20. The patient states she lives with this son but she was unaware of where he works,his working hours or any other information. Son acted as if he had not seen her for quite sometime. The son lives in [place] and she lives in [place](last conversation) then she recanted and stated she lived with him. Later this morning(12/21) she stated she lived in [place] lives in [place] and daughter lives in [place]. . After reading Dr. Winifred Olive consultation note he has repeatedly told her of her right kidney infarct and decreased function of her left kidney. She did not follow up with Dr. Gilda Crease as advised. She was upset after talking with Dr.Lateef about arteriogram of right kidney. Dr. Cherylann Ratel drew diagram of kidney vessels on board in patients room for family. Comfort offered. Agree with Collie Siad( case manger) that she is unable to care for self and family is not helping her.

## 2016-03-01 NOTE — Progress Notes (Signed)
[facility] Cardiology  SUBJECTIVE: I haven't had any more episodes   Vitals:   03/01/16 0500 03/01/16 0600 03/01/16 0700 03/01/16 0800  BP: (!) 179/47 (!) 168/56 (!) 192/60 (!) 192/68  Pulse: 62 66 71 81  Resp: 15 16 19 19   Temp:    98.1 F (36.7 C)  TempSrc:      SpO2: 94% 95% 92% 96%  Weight:      Height:         Intake/Output Summary (Last 24 hours) at 03/01/16 0848 Last data filed at 03/01/16 0800  Gross per 24 hour  Intake           989.34 ml  Output             1125 ml  Net          -135.66 ml      PHYSICAL EXAM  General: Well developed, well nourished, in no acute distress HEENT:  Normocephalic and atramatic Neck:  No JVD.  Lungs: Clear bilaterally to auscultation and percussion. Heart: HRRR . Normal S1 and S2 without gallops or murmurs.  Abdomen: Bowel sounds are positive, abdomen soft and non-tender  Msk:  Back normal, normal gait. Normal strength and tone for age. Extremities: No clubbing, cyanosis or edema.   Neuro: Alert and oriented X 3. Psych:  Good affect, responds appropriately   LABS: Basic Metabolic Panel:  Recent Labs  27/25/36 0621 03/01/16 0402  NA 139 138  K 3.9 4.0  CL 106 104  CO2 26 26  GLUCOSE 134* 312*  BUN 21* 25*  CREATININE 1.19* 1.38*  CALCIUM 8.6* 8.5*  PHOS 3.3  --    Liver Function Tests:  Recent Labs  02/27/16 2205 02/29/16 0621  AST 22  --   ALT 16  --   ALKPHOS 114  --   BILITOT 0.5  --   PROT 7.6  --   ALBUMIN 3.6 2.9*    Recent Labs  02/27/16 2205  LIPASE 13   CBC:  Recent Labs  02/27/16 2205  02/29/16 0021 03/01/16 0402  WBC 13.7*  < > 16.6* 17.6*  NEUTROABS 11.4*  --   --   --   HGB 12.1  < > 10.7* 10.0*  HCT 37.2  < > 32.1* 30.2*  MCV 82.8  < > 82.8 82.8  PLT 357  < > 304 267  < > = values in this interval not displayed. Cardiac Enzymes:  Recent Labs  02/28/16 0546 02/28/16 1148 02/28/16 1441  TROPONINI 0.03* <0.03 0.03*   BNP: Invalid input(s): POCBNP D-Dimer: No results for  input(s): DDIMER in the last 72 hours. Hemoglobin A1C: No results for input(s): HGBA1C in the last 72 hours. Fasting Lipid Panel: No results for input(s): CHOL, HDL, LDLCALC, TRIG, CHOLHDL, LDLDIRECT in the last 72 hours. Thyroid Function Tests: No results for input(s): TSH, T4TOTAL, T3FREE, THYROIDAB in the last 72 hours.  Invalid input(s): FREET3 Anemia Panel: No results for input(s): VITAMINB12, FOLATE, FERRITIN, TIBC, IRON, RETICCTPCT in the last 72 hours.  No results found.   Echo normal left ventricular function, LVEF 50-55%, mild to moderate aortic stenosis  TELEMETRY: Normal sinus rhythm:  ASSESSMENT AND PLAN:  Principal Problem:   Renal infarct (HCC) Active Problems:   HLD (hyperlipidemia)   CAD (coronary artery disease)   Chronic systolic CHF (congestive heart failure) (HCC)   Diabetes (HCC)   A-fib (HCC)   Aortic mural thrombus (HCC)   Renal artery stenosis (HCC)   Accelerated hypertension  Mild dementia    1. Episodic bradycardia, likely vasovagal 2. Paroxysmal atrial fibrillation, on Xarelto for stroke prevention, with history of noncompliance 3. Left renal infarct, with known left renal artery stenosis and occluded right renal artery  Recommendations  1. Agree with overall current therapy 2. Continue metoprolol tartrate, up titrate as needed 3. Defer temporary or permanent pacemaker implantation at this time 4. No further cardiac diagnostics at this time  Signed off for now, please call if any questions   Marcina MillardAlexander Arlis Yale, MD, PhD, [facility] CenterFACC 03/01/2016 8:48 AM

## 2016-03-01 NOTE — Progress Notes (Signed)
[facility]  ROUNDING NOTE   Subjective:  Patient resting comfortably in bed. However she is a bit anxious. Creatinine slightly higher today at 1.3. Case discussed with the patient's daughters as well as with vascular surgery. Tentative plan is for renal angiogram tomorrow.  Objective:  Vital signs in last 24 hours:  Temp:  [98 F (36.7 C)-98.8 F (37.1 C)] 98.1 F (36.7 C) (12/21 0800) Pulse Rate:  [62-81] 78 (12/21 1000) Resp:  [15-28] 23 (12/21 1000) BP: (151-192)/(46-96) 157/54 (12/21 1000) SpO2:  [90 %-97 %] 97 % (12/21 1000)  Weight change:  Filed Weights   02/27/16 2136  Weight: 71.7 kg (158 lb)    Intake/Output: I/O last 3 completed shifts: In: 1150.5 [P.O.:420; I.V.:730.5] Out: 1375 [Urine:1375]   Intake/Output this shift:  Total I/O In: 12 [I.V.:12] Out: 200 [Urine:200]  Physical Exam: General: No acute distress  Head: Normocephalic, atraumatic. Moist oral mucosal membranes  Eyes: Anicteric  Neck: Supple, trachea midline  Lungs:  Clear to auscultation, normal effort  Heart: S1S2 no rubs  Abdomen:  Soft, nontender, bowel sounds present  Extremities: trace peripheral edema.  Neurologic: Nonfocal, moving all four extremities, anxious  Skin: No lesions       Basic Metabolic Panel:  Recent Labs Lab 02/27/16 2205 02/28/16 0844 02/29/16 0621 03/01/16 0402  NA 137 134* 139 138  K 4.1 4.1 3.9 4.0  CL 102 103 106 104  CO2 25 23 26 26   GLUCOSE 259* 336* 134* 312*  BUN 23* 22* 21* 25*  CREATININE 1.12* 1.30* 1.19* 1.38*  CALCIUM 8.8* 8.0* 8.6* 8.5*  PHOS  --   --  3.3  --     Liver Function Tests:  Recent Labs Lab 02/27/16 2205 02/29/16 0621  AST 22  --   ALT 16  --   ALKPHOS 114  --   BILITOT 0.5  --   PROT 7.6  --   ALBUMIN 3.6 2.9*    Recent Labs Lab 02/27/16 2205  LIPASE 13   No results for input(s): AMMONIA in the last 168 hours.  CBC:  Recent Labs Lab 02/27/16 2205 02/28/16 0844 02/29/16 0021  03/01/16 0402  WBC 13.7* 13.6* 16.6* 17.6*  NEUTROABS 11.4*  --   --   --   HGB 12.1 11.1* 10.7* 10.0*  HCT 37.2 33.3* 32.1* 30.2*  MCV 82.8 82.9 82.8 82.8  PLT 357 325 304 267    Cardiac Enzymes:  Recent Labs Lab 02/27/16 2205 02/28/16 0115 02/28/16 0546 02/28/16 1148 02/28/16 1441  TROPONINI 0.03* 0.03* 0.03* <0.03 0.03*    BNP: Invalid input(s): POCBNP  CBG:  Recent Labs Lab 02/29/16 0813 02/29/16 1144 02/29/16 1552 02/29/16 2126 03/01/16 0801  GLUCAP 228* 244* 158* 367* 304*    Microbiology: Results for orders placed or performed during the hospital encounter of 02/27/16  MRSA PCR Screening     Status: None   Collection Time: 02/28/16  2:21 AM  Result Value Ref Range Status   MRSA by PCR NEGATIVE NEGATIVE Final    Comment:        The GeneXpert MRSA Assay (FDA approved for NASAL specimens only), is one component of a comprehensive MRSA colonization surveillance program. It is not intended to diagnose MRSA infection nor to guide or monitor treatment for MRSA infections.     Coagulation Studies:  Recent Labs  02/28/16 0115  LABPROT 13.8  INR 1.06    Urinalysis:  Recent Labs  02/27/16 2250  COLORURINE YELLOW*  LABSPEC 1.014  PHURINE 7.0  GLUCOSEU >=500*  HGBUR NEGATIVE  BILIRUBINUR NEGATIVE  KETONESUR 5*  PROTEINUR >=300*  NITRITE NEGATIVE  LEUKOCYTESUR NEGATIVE      Imaging: No results found.   Medications:   . heparin 1,200 Units/hr (03/01/16 0700)   . amLODipine  5 mg Oral Daily  . hydrALAZINE  50 mg Oral Q8H  . insulin aspart  0-9 Units Subcutaneous TID AC & HS  . [START ON 03/02/2016] insulin glargine  15 Units Subcutaneous Daily  . ipratropium-albuterol  3 mL Nebulization Q4H  . mouth rinse  15 mL Mouth Rinse BID  . metoprolol tartrate  50 mg Oral BID  . mometasone-formoterol  2 puff Inhalation BID  . pantoprazole  40 mg Oral QAC breakfast   acetaminophen **OR** acetaminophen, alum & mag hydroxide-simeth,  ondansetron **OR** ondansetron (ZOFRAN) IV, oxyCODONE-acetaminophen  Assessment/ Plan:  75 y.o. female with a PMHx of Paroxysmal atrial fibrillation, congestive heart failure, coronary artery disease, diabetes mellitus type 2, hyperlipidemia, hypertension, who was admitted to [facility] on 02/27/2016 for evaluation of  left-sided flank pain. The patient had CT scan of the abdomen and pelvis with contrast upon admission. This showed a hyperperfused right kidney which had decreased in size as compared to the prior recent examination. In addition she was found to have peripheral hypodensities within the left kidney and left renal artery stenosis. This was consistent with left renal infarctions.   1.  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 2.  Malignant hypertension. 3.  Atrial fibrillation with left atrial appendage thrombus. 4.  Left renal infarction/left renal artery stenosis.  Plan:  As before the patient has a very complex case. She has an at least subacute clotted right renal artery with acute left renal infarction along with left renal artery stenosis. Left alone the patient has a very high risk for progression to end-stage renal disease. However intervention carries some risks as well including the potential need for oral renal replacement therapy for worsening acute renal failure. This was discussed in depth with the patient as well as with her family. For now we have decided tentatively for renal angiogram tomorrow to address the left-sided renal artery stenosis. In the interim she will continue with blood pressure control as well as heparin drip. Hydralazine was added to her medication regimen this a.m. Further plan as patient progresses.   LOS: 2 Merla Sawka 12/21/201710:37 AM

## 2016-03-01 NOTE — Progress Notes (Signed)
[facility] [place] [facility]   PATIENT NAME: Robin [name]    MR#:  [id number]  DATE OF BIRTH:  1940/08/24  SUBJECTIVE:  CHIEF COMPLAINT:   Chief Complaint  Patient presents with  . Hypertension   - feels short of breath, anxious - BP improving, off nicardipine drip ow . Also on heparin drip for renal infarct  REVIEW OF SYSTEMS:  Review of Systems  Constitutional: Positive for malaise/fatigue. Negative for chills and fever.  HENT: Negative for ear discharge, ear pain, hearing loss and nosebleeds.   Eyes: Negative for blurred vision, double vision and photophobia.  Respiratory: Positive for shortness of breath. Negative for cough, hemoptysis and wheezing.   Cardiovascular: Negative for chest pain, palpitations, orthopnea and leg swelling.  Gastrointestinal: Positive for abdominal pain. Negative for constipation, diarrhea, heartburn, melena, nausea and vomiting.  Genitourinary: Negative for dysuria.  Musculoskeletal: Negative for myalgias.  Skin: Negative for rash.  Neurological: Negative for dizziness, sensory change, speech change, focal weakness and headaches.  Endo/Heme/Allergies: Does not bruise/bleed easily.  Psychiatric/Behavioral: Negative for depression. The patient is nervous/anxious.     DRUG ALLERGIES:  No Known Allergies  VITALS:  Blood pressure (!) 189/62, pulse 78, temperature 98.7 F (37.1 C), resp. rate (!) 21, height 5\' 4"  (1.626 m), weight 71.7 kg (158 lb), SpO2 95 %.  PHYSICAL EXAMINATION:  Physical Exam  GENERAL:  75 y.o.-year-old patient lying in the bed with no acute distress.  EYES: Pupils equal, round, reactive to light and accommodation. No scleral icterus. Extraocular muscles intact.  HEENT: Head atraumatic, normocephalic. Oropharynx and nasopharynx clear.  NECK:  Supple, no jugular venous distention. No thyroid enlargement, no tenderness.  LUNGS: improved breath sounds bilaterally. Scattered expiratory wheezes heard.  No rales,rhonchi or crepitation. No use of accessory muscles of respiration.  CARDIOVASCULAR: S1, S2 normal. No murmurs, rubs, or gallops.  ABDOMEN: Soft, nontender, nondistended. Bowel sounds present. No organomegaly or mass.  EXTREMITIES: No pedal edema, cyanosis, or clubbing.  NEUROLOGIC: Cranial nerves II through XII are intact. Muscle strength 5/5 in all extremities. Sensation intact. Gait not checked.  PSYCHIATRIC: The patient is alert and oriented x 3. Very anxious. SKIN: No obvious rash, lesion, or ulcer.    LABORATORY PANEL:   CBC  Recent Labs Lab 03/01/16 0402  WBC 17.6*  HGB 10.0*  HCT 30.2*  PLT 267   ------------------------------------------------------------------------------------------------------------------  Chemistries   Recent Labs Lab 02/27/16 2205  03/01/16 0402  NA 137  < > 138  K 4.1  < > 4.0  CL 102  < > 104  CO2 25  < > 26  GLUCOSE 259*  < > 312*  BUN 23*  < > 25*  CREATININE 1.12*  < > 1.38*  CALCIUM 8.8*  < > 8.5*  AST 22  --   --   ALT 16  --   --   ALKPHOS 114  --   --   BILITOT 0.5  --   --   < > = values in this interval not displayed. ------------------------------------------------------------------------------------------------------------------  Cardiac Enzymes  Recent Labs Lab 02/28/16 1441  TROPONINI 0.03*   ------------------------------------------------------------------------------------------------------------------  RADIOLOGY:  No results found.  EKG:   Orders placed or performed during the hospital encounter of 02/27/16  . EKG 12-Lead  . EKG 12-Lead    ASSESSMENT AND PLAN:   75 year old female with past medical history significant for atrial fibrillation with several embolic events, discontinued her Xarelto as an outpatient, recent admission with right renal  artery stenosis, depression and anxiety, CAD, hypertension and diabetes presents to hospital secondary to left flank pain and noted to have left renal  infarct.  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 -Appreciate vascular surgery consult. Currently on heparin drip. -Also recent right renal artery thrombosis - Monitor renal function again. If stable, might need an angiography to address left renal artery stenosis. -Pain is improved now.  #2 atrial fibrillation-rate controlled at this time. Importance of anticoagulation has been discussed again. Currently on heparin drip.  #3 hypertensive urgency-especially with the renal artery stenosis/ occlusion, blood pressure has been elevated  -off nicardipine drip. On oral metoprolol and Norvasc. Oral hydralazine added  #4 acute dyspnea- improving. Secondary to reactive airway disease. On 2l o2 today -One dose of Solu-Medrol received with improvement and nebs have been ordered.   #5 diabetes mellitus-continue Lantus and also on sliding scale insulin.  #6 DVT prophylaxis- on heparin drip     All the records are reviewed and case discussed with Care Management/Social Workerr. Management plans discussed with the patient, family and they are in agreement.  CODE STATUS: Full code  TOTAL CRITICAL CARE TIME SPENT IN TAKING CARE OF THIS PATIENT: 39 minutes.   POSSIBLE D/C IN 3-5 DAYS, DEPENDING ON CLINICAL CONDITION.   Enid BaasKALISETTI,Duran Ohern M.D on 03/01/2016 at 2:57 PM  Between 7am to 6pm - Pager - [phone]  After 6pm go to [email or web address] - password [email or web address]  [facility]  Office  [phone]  CC: Primary care physician; Leanna SatoMILES,LINDA M, MD

## 2016-03-01 NOTE — Progress Notes (Signed)
Inpatient Diabetes Program Recommendations  AACE/ADA: New Consensus Statement on Inpatient Glycemic Control (2015)  Target Ranges:  Prepandial:   less than 140 mg/dL      Peak postprandial:   less than 180 mg/dL (1-2 hours)      Critically ill patients:  140 - 180 mg/dL   Lab Results  Component Value Date   GLUCAP 304 (H) 03/01/2016   HGBA1C 8.7 (H) 02/15/2016    Review of Glycemic Control  Results for JAKAYLAH, LOUGHRY (MRN [id number]) as of 03/01/2016 09:58  Ref. Range 02/29/2016 08:13 02/29/2016 11:44 02/29/2016 15:52 02/29/2016 21:26 03/01/2016 08:01  Glucose-Capillary Latest Ref Range: 65 - 99 mg/dL 053 (H) 976 (H) 734 (H) 367 (H) 304 (H)   Home medication: none  Current inpatient medications: Consider increasing Lantus to 15 units daily (0.2units/kg). Consider increasing Novolog correction to moderate correction 0-15 units tid/hs.   Based on patient's A1C, patient will need oral medications for diabetes at d/c.    Susette Racer, RN, BA, MHA, CDE Diabetes Coordinator Inpatient Diabetes Program  [phone] (Team Pager) [phone] [facility] Office) 03/01/2016 10:01 AM

## 2016-03-01 NOTE — Evaluation (Signed)
Physical Therapy Evaluation Patient Details Name: Robin [name] MRN: [id number] DOB: 08/17/1940 Today's Date: 03/01/2016   History of Present Illness  presented to ER with abdominal pain radiating to back, elevated BP; admitted with renal infarct related to L atrial/aortic thrombus.  Scheduled for renal angiogram 12/22.  Clinical Impression  Patient agreeable to evaluation with moderate encouragement from therapist.  Demonstrates limited cooperation with interview portion of evaluation; will verify with patient/family at later time as appropriate.  Able to complete all mobility with cga/min assist (patient pushing IV pole); will plan to trial RW in subsequent session as appropriate.  No significant buckling, LOB or safety concern; continued encouragement for full participation with session. Would benefit from skilled PT to address above deficits and promote optimal return to PLOF; Recommend transition to HHPT upon discharge from acute hospitalization.     Follow Up Recommendations Home health PT    Equipment Recommendations  Rolling walker with 5" wheels    Recommendations for Other Services       Precautions / Restrictions Precautions Precautions: Fall Restrictions Weight Bearing Restrictions: No      Mobility  Bed Mobility Overal bed mobility: Modified Independent                Transfers Overall transfer level: Needs assistance Equipment used: Rolling walker (2 wheeled) Transfers: Sit to/from Stand Sit to Stand: Min guard         General transfer comment: fair LE strength/power; UE support for safety/stability  Ambulation/Gait Ambulation/Gait assistance: Min guard Ambulation Distance (Feet): 120 Feet Assistive device:  (IV pole)       General Gait Details: reciprocal stepping pattern with fair step height/length; forward trunk flexion.  Fair cadence and gait speed wtihout buckling or LOB  Stairs            Wheelchair Mobility    Modified  Rankin (Stroke Patients Only)       Balance Overall balance assessment: Needs assistance Sitting-balance support: No upper extremity supported;Feet supported Sitting balance-Leahy Scale: Good     Standing balance support: Single extremity supported Standing balance-Leahy Scale: Fair                               Pertinent Vitals/Pain Pain Assessment: No/denies pain    Home Living Family/patient expects to be discharged to:: Private residence Living Arrangements: Children Available Help at Discharge: Family;Available PRN/intermittently Type of Home: Apartment Home Access: Stairs to enter (per previous documentation)   Entrance Stairs-Number of Steps: 3 Home Layout: One level Home Equipment: Walker - 2 wheels;Cane - quad;Bedside commode;Shower seat;Wheelchair - manual (per previous notes)      Prior Function           Comments: Per patient, mod indep without assist device; denies fall history; question accuracy of report     Hand Dominance        Extremity/Trunk Assessment   Upper Extremity Assessment Upper Extremity Assessment: Overall WFL for tasks assessed (grossly 4/5)    Lower Extremity Assessment Lower Extremity Assessment: Overall WFL for tasks assessed (grossly 4/5)       Communication      Cognition Arousal/Alertness: Awake/alert Behavior During Therapy: Flat affect Overall Cognitive Status: Difficult to assess (limited cooperation with cognitive assessment)                      General Comments      Exercises     Assessment/Plan  PT Assessment Patient needs continued PT services  PT Problem List Decreased activity tolerance;Decreased balance;Decreased mobility;Decreased knowledge of use of DME;Decreased safety awareness;Decreased knowledge of precautions;Cardiopulmonary status limiting activity          PT Treatment Interventions DME instruction;Gait training;Stair training;Functional mobility  training;Therapeutic activities;Therapeutic exercise;Balance training;Cognitive remediation;Patient/family education;Modalities    PT Goals (Current goals can be found in the Care Plan section)  Acute Rehab PT Goals Patient Stated Goal: does not state PT Goal Formulation: With patient Time For Goal Achievement: 03/15/16 Potential to Achieve Goals: Good    Frequency Min 2X/week   Barriers to discharge Decreased caregiver support questionable compliance to medical/treatment plan    Co-evaluation               End of Session Equipment Utilized During Treatment: Gait belt Activity Tolerance: Patient tolerated treatment well Patient left: in chair;with call bell/phone within reach Nurse Communication: Mobility status         Time: 2841-3244 PT Time Calculation (min) (ACUTE ONLY): 21 min   Charges:   PT Evaluation $PT Eval Low Complexity: 1 Procedure     PT G Codes:       Jamez Ambrocio H. Manson Passey, PT, DPT, NCS 03/01/16, 5:18 PM [phone]

## 2016-03-01 NOTE — Progress Notes (Signed)
Pt alert and sitting up. Pt anxious about upcoming surgery tomorrow. Son and daughter live nearby. Some family coming in pre-surgery. CH offered prayer.   03/01/16 1030  Clinical Encounter Type  Visited With Patient;Health care provider  Visit Type Critical Care  Referral From Nurse  Spiritual Encounters  Spiritual Needs Prayer;Emotional  Stress Factors  Patient Stress Factors Health changes

## 2016-03-02 ENCOUNTER — Encounter
Admission: EM | Disposition: A | Discharge status: Home Health | Payer: Self-pay | Source: Home / Self Care | Attending: Internal Medicine

## 2016-03-02 DIAGNOSIS — I701 Atherosclerosis of renal artery: Secondary | ICD-10-CM

## 2016-03-02 HISTORY — PX: PERIPHERAL VASCULAR CATHETERIZATION: SHX172C

## 2016-03-02 LAB — BASIC METABOLIC PANEL
Anion gap: 6 (ref 5–15)
BUN: 35 mg/dL — AB (ref 6–20)
CHLORIDE: 104 mmol/L (ref 101–111)
CO2: 27 mmol/L (ref 22–32)
Calcium: 8.4 mg/dL — ABNORMAL LOW (ref 8.9–10.3)
Creatinine, Ser: 1.28 mg/dL — ABNORMAL HIGH (ref 0.44–1.00)
GFR calc Af Amer: 46 mL/min — ABNORMAL LOW (ref >60)
GFR calc non Af Amer: 40 mL/min — ABNORMAL LOW (ref >60)
Glucose, Bld: 245 mg/dL — ABNORMAL HIGH (ref 65–99)
POTASSIUM: 4.1 mmol/L (ref 3.5–5.1)
SODIUM: 137 mmol/L (ref 135–145)

## 2016-03-02 LAB — GLUCOSE, CAPILLARY
GLUCOSE-CAPILLARY: 205 mg/dL — AB (ref 65–99)
GLUCOSE-CAPILLARY: 101 mg/dL — AB (ref 65–99)
GLUCOSE-CAPILLARY: 130 mg/dL — AB (ref 65–99)
GLUCOSE-CAPILLARY: 216 mg/dL — AB (ref 65–99)
Glucose-Capillary: 125 mg/dL — ABNORMAL HIGH (ref 65–99)

## 2016-03-02 LAB — CBC
HEMATOCRIT: 29.8 % — AB (ref 35.0–47.0)
HEMOGLOBIN: 9.8 g/dL — AB (ref 12.0–16.0)
MCH: 26.9 pg (ref 26.0–34.0)
MCHC: 32.8 g/dL (ref 32.0–36.0)
MCV: 82 fL (ref 80.0–100.0)
Platelets: 281 10*3/uL (ref 150–440)
RBC: 3.64 MIL/uL — AB (ref 3.80–5.20)
RDW: 14.2 % (ref 11.5–14.5)
WBC: 17.8 10*3/uL — ABNORMAL HIGH (ref 3.6–11.0)

## 2016-03-02 LAB — HEPARIN LEVEL (UNFRACTIONATED)
Heparin Unfractionated: 0.34 IU/mL (ref 0.30–0.70)
Heparin Unfractionated: 0.26 IU/mL — ABNORMAL LOW (ref 0.30–0.70)

## 2016-03-02 SURGERY — RENAL INTERVENTION
Anesthesia: Moderate Sedation | Laterality: Left

## 2016-03-02 MED ORDER — HEPARIN (PORCINE) IN NACL 2-0.9 UNIT/ML-% IJ SOLN
INTRAMUSCULAR | Status: AC
  Filled 2016-03-02: qty 1000

## 2016-03-02 MED ORDER — FENTANYL CITRATE (PF) 100 MCG/2ML IJ SOLN
INTRAMUSCULAR | Status: DC | PRN
  Administered 2016-03-02 (×3): 50 ug via INTRAVENOUS

## 2016-03-02 MED ORDER — SODIUM CHLORIDE 0.9 % IV SOLN
INTRAVENOUS | Status: DC
  Administered 2016-03-02 – 2016-03-03 (×2): via INTRAVENOUS

## 2016-03-02 MED ORDER — IPRATROPIUM-ALBUTEROL 0.5-2.5 (3) MG/3ML IN SOLN
3.0000 mL | RESPIRATORY_TRACT | Status: DC | PRN
  Administered 2016-03-03: 3 mL via RESPIRATORY_TRACT
  Filled 2016-03-02: qty 3

## 2016-03-02 MED ORDER — MIDAZOLAM HCL 2 MG/2ML IJ SOLN
INTRAMUSCULAR | Status: DC | PRN
  Administered 2016-03-02: 1 mg via INTRAVENOUS
  Administered 2016-03-02: 2 mg via INTRAVENOUS
  Administered 2016-03-02: 1 mg via INTRAVENOUS

## 2016-03-02 MED ORDER — LIDOCAINE HCL (PF) 1 % IJ SOLN
INTRAMUSCULAR | Status: AC
  Filled 2016-03-02: qty 30

## 2016-03-02 MED ORDER — FENTANYL CITRATE (PF) 100 MCG/2ML IJ SOLN
INTRAMUSCULAR | Status: AC
  Filled 2016-03-02: qty 4

## 2016-03-02 MED ORDER — HEPARIN (PORCINE) IN NACL 100-0.45 UNIT/ML-% IJ SOLN
1200.0000 [IU]/h | INTRAMUSCULAR | Status: DC
  Administered 2016-03-02: 1200 [IU]/h via INTRAVENOUS
  Filled 2016-03-02: qty 250

## 2016-03-02 MED ORDER — MIDAZOLAM HCL 5 MG/5ML IJ SOLN
INTRAMUSCULAR | Status: AC
  Filled 2016-03-02: qty 5

## 2016-03-02 MED ORDER — HEPARIN SODIUM (PORCINE) 1000 UNIT/ML IJ SOLN
INTRAMUSCULAR | Status: AC
  Filled 2016-03-02: qty 1

## 2016-03-02 MED ORDER — IOPAMIDOL (ISOVUE-300) INJECTION 61%
INTRAVENOUS | Status: DC | PRN
  Administered 2016-03-02: 19 mL via INTRA_ARTERIAL

## 2016-03-02 MED ORDER — ONDANSETRON HCL 4 MG/2ML IJ SOLN
4.0000 mg | Freq: Four times a day (QID) | INTRAMUSCULAR | Status: DC | PRN
  Administered 2016-03-03: 4 mg via INTRAVENOUS

## 2016-03-02 MED ORDER — HEPARIN SODIUM (PORCINE) 1000 UNIT/ML IJ SOLN
INTRAMUSCULAR | Status: DC | PRN
  Administered 2016-03-02: 4000 [IU] via INTRAVENOUS

## 2016-03-02 SURGICAL SUPPLY — 14 items
CATH C2 65CM (CATHETERS) ×3 IMPLANT
CATH PIG 70CM (CATHETERS) ×3 IMPLANT
DEVICE PRESTO INFLATION (MISCELLANEOUS) ×6 IMPLANT
DEVICE STARCLOSE SE CLOSURE (Vascular Products) ×3 IMPLANT
DEVICE TORQUE (MISCELLANEOUS) ×3 IMPLANT
GUIDEWIRE ANGLED .035 180CM (WIRE) ×3 IMPLANT
NEEDLE ENTRY 21GA 7CM ECHOTIP (NEEDLE) ×3 IMPLANT
PACK ANGIOGRAPHY (CUSTOM PROCEDURE TRAY) ×3 IMPLANT
SET INTRO CAPELLA COAXIAL (SET/KITS/TRAYS/PACK) ×3 IMPLANT
SHEATH BRITE TIP 5FRX11 (SHEATH) ×3 IMPLANT
SHEATH DESTINATION 7F 45CM RDC (SHEATH) ×3 IMPLANT
STENT LIFESTREAM 6X16X80 (Permanent Stent) ×3 IMPLANT
WIRE J 3MM .035X145CM (WIRE) ×3 IMPLANT
WIRE MAGIC TOR.035 180C (WIRE) ×3 IMPLANT

## 2016-03-02 NOTE — Progress Notes (Addendum)
ANTICOAGULATION CONSULT NOTE - Initial Consult  Pharmacy Consult for heparin Indication: Renal infarct   Allergies  Allergen Reactions  . Morphine And Related Other (See Comments)    Patient states it makes her "space out"    Patient Measurements: Height: 5\' 4"  (162.6 cm) Weight: 155 lb 12.8 oz (70.7 kg) IBW/kg (Calculated) : 54.7 Heparin Dosing Weight: 69.4 kg  Vital Signs: Temp: 98.3 F (36.8 C) (12/22 1936) Temp Source: Oral (12/22 1936) BP: 169/50 (12/22 1936) Pulse Rate: 70 (12/22 1936)  Labs:  Recent Labs  02/29/16 0021 02/29/16 0621  03/01/16 0402 03/02/16 0456 03/02/16 2035  HGB 10.7*  --   --  10.0* 9.8*  --   HCT 32.1*  --   --  30.2* 29.8*  --   PLT 304  --   --  267 281  --   APTT 89*  --   --   --   --   --   HEPARINUNFRC 0.60  --   < > 0.48 0.34 0.26*  CREATININE  --  1.19*  --  1.38* 1.28*  --   < > = values in this interval not displayed.  Estimated Creatinine Clearance: 36.6 mL/min (by C-G formula based on SCr of 1.28 mg/dL (H)).   Medical History: Past Medical History:  Diagnosis Date  . Afib (HCC)   . Anxiety   . Asthma   . CHF (congestive heart failure) (HCC)   . Coronary artery disease   . Diabetes mellitus without complication (HCC)   . Hyperlipidemia   . Hypertension     Medications:  Infusions:  . sodium chloride 75 mL/hr at 03/02/16 1642  . heparin 1,200 Units/hr (03/02/16 1922)    Assessment: 75 yof cc hypertension presents to ED after recent hospital visit for renal thromboembolism/infarct during which time she started Xarelto. Baseline anti-Xa level < 0.1 suggestive of noncompliance.   Goal of Therapy:  aPTT 66 to 102 seconds  HL 0.3 - 0.7 Monitor platelets by anticoagulation protocol: Yes   Plan:  Heparin drip was stopped for vascular procedure today.   Current orders in to resume heparin drip at previous rate of 1200 units/hr at 1630 today per vascular surgery.  HL ordered for 2030 per instructions. CBC ordered  with AM labs tomorrow.  12/22:  HL @ 20:30 = 0.26.  Heparin gtt was started @ 19:30 so level only reflects 1 hr of infusion.  Will recheck HL in 8 hrs on 12/23 @ 3:30.   12/23 0409 HL therapeutic x 1. Continue current rate. Will recheck in 8 hours. -NAC  Robbins,Jason D, Pharm.D Clinical Pharmacist 03/02/2016,9:31 PM

## 2016-03-02 NOTE — Op Note (Signed)
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 Ultrasound guidance for vascular access right femoral artery 2. Catheter placement into left renal artery from right femoral approach 3. Aortogram and selective left renal angiogram 4. Balloon expandable stent placement to 6 with a 6 mm diameter x 16 mm length stent Lifestream 5. StarClose closure device right femoral artery  Contrast: 19 cc  EBL: Less than 10 cc   Fluoro Time: 4.7 minutes  Moderate conscious sedation: Approximately 40 minutes with 3 mg of Versed and 100 mcg of Fentanyl  Indications: The patient is a 75 year old woman with worsening severe hypertension despite multiple medications. The patient has suboptimal blood pressure control despite multiple antihypertensives and a noninvasive study demonstrating hemodynamically significant left renal artery stenosis. Given the clinical scenario which also includes a complete occlusion of the right renal artery secondary to embolization and severe embolization to the left kidney resulting in infarction of approximately half the kidney secondary to atrial fibrillation and the noninvasive findings, angiogram is indicated for further evaluation of her renal artery and potential treatment. Risks and benefits are discussed and informed consent is obtained.  Procedure: The patient was identified and appropriate procedural time out was performed. The patient was then placed supine on the table and prepped and draped in the usual sterile fashion.Moderate conscious sedation was administered with a face to  face encounter with the patient throughout the procedure with my supervision of the RN administering medicines and monitoring the patients vital signs and mental status throughout from the start of the procedure until the patient was taken to the recovery room  Ultrasound was used to evaluate the right common femoral artery. It was patent . A digital ultrasound image was acquired. A micropuncture needle was used to access the right common femoral artery under direct ultrasound guidance and a permanent image was performed. Microwire is advanced followed by the micro-sheath. A 0.035 J wire was advanced without resistance and a 5Fr sheath was placed. Pigtail catheter was placed into the aorta at the L1 level and an AP aortogram was performed. This demonstrated greater than 90% stenosis of the left renal artery at its origin. The origin of the left renal artery is located the top of L2. The patient was then systemically heparinized with 4000 units of intravenous heparin. I used a the S1 catheter to cannulate the left renal artery and selective imaging was performed. This confirmed a 90% stenosis of the left renal artery.  At this point I selected the Magic torque wire and crossed the lesion without difficulty.   A Destination renal double curve 7 [place] sheath was then it over the wire and positioned with the tip of the sheath at the origin of the left renal artery.  I then selected a 6 mm diameter x 16 mm length balloon expandable stent and brought this across the lesion.  This was deployed encompassing the lesion with its proximal extent going back into the aorta for a mm or two.  This was inflated to 12 ATM and the waist resolved.  Completion angiogram showed stent in excellent position well opposed to the wall with less than 5% residual stenosis.  The sheath was pulled back into the right external iliac artery. Oblique arteriogram was performed of the right femoral artery and StarClose  closure device was  deployed in the usual fashion with excellent hemostatic result. The patient was taken to the recovery room in stable condition having tolerated the procedure well.  Findings:  Aortogram/Renal Arteries:Aorta is widely patent. The right renal artery is nonvisualized consistent with occlusion. The left renal artery is patent and straightening a 90% stenosis at its ostium. Nephrogram only highlights a portion of the kidney. This is consistent with the findings on CT scan. Following and plasty and stent placement there is near-total resolution of the lesion with a well placed stent extending into the aorta by approximately 2 mm   Condition:  Stable  Complications: None   Robin [name] 03/02/2016 3:29 PM  This note was created with Dragon Medical transcription system. Any errors in dictation are purely unintentional.

## 2016-03-02 NOTE — Progress Notes (Signed)
ANTICOAGULATION CONSULT NOTE - Initial Consult  Pharmacy Consult for heparin Indication: Renal infarct   No Known Allergies  Patient Measurements: Height: 5\' 4"  (162.6 cm) Weight: 155 lb 12.8 oz (70.7 kg) IBW/kg (Calculated) : 54.7 Heparin Dosing Weight: 69.4 kg  Vital Signs: Temp: 98.8 F (37.1 C) (12/22 0420) Temp Source: Oral (12/22 0420) BP: 163/53 (12/22 0420) Pulse Rate: 66 (12/22 0420)  Labs:  Recent Labs  02/28/16 0844 02/28/16 1148  02/28/16 1441 02/29/16 0021 02/29/16 0621 02/29/16 0844 03/01/16 0402 03/02/16 0456  HGB 11.1*  --   --   --  10.7*  --   --  10.0* 9.8*  HCT 33.3*  --   --   --  32.1*  --   --  30.2* 29.8*  PLT 325  --   --   --  304  --   --  267 281  APTT 92*  --   --  54* 89*  --   --   --   --   HEPARINUNFRC  --   --   < > 0.52 0.60  --  0.62 0.48 0.34  CREATININE 1.30*  --   --   --   --  1.19*  --  1.38* 1.28*  TROPONINI  --  <0.03  --  0.03*  --   --   --   --   --   < > = values in this interval not displayed.  Estimated Creatinine Clearance: 36.6 mL/min (by C-G formula based on SCr of 1.28 mg/dL (H)).   Medical History: Past Medical History:  Diagnosis Date  . Afib (HCC)   . Anxiety   . Asthma   . CHF (congestive heart failure) (HCC)   . Coronary artery disease   . Diabetes mellitus without complication (HCC)   . Hyperlipidemia   . Hypertension     Medications:  Infusions:  . heparin 1,200 Units/hr (03/01/16 2000)    Assessment: 75 yof cc hypertension presents to ED after recent hospital visit for renal thromboembolism/infarct during which time she started Xarelto. Baseline anti-Xa level < 0.1 suggestive of noncompliance. Heparin currently infusing at 1200 units/hr.   Goal of Therapy:  aPTT 66 to 102 seconds Monitor platelets by anticoagulation protocol: Yes   Plan:  Will continue at current rate. Will recheck anti-Xa with am labs.   12/21 0402 HL therapeutic. Continue current rate. Will continue to monitor HL and  CBC daily.  12/22 0456 HL therapeutic. Continue current rate. Will continue to monitor HL and CBC daily.  Pharmacy will continue to monitor and adjust per consult.    Carola Frost, Pharm.D., BCPS Clinical Pharmacist 03/02/2016,6:54 AM

## 2016-03-02 NOTE — Progress Notes (Signed)
[facility]  ROUNDING NOTE   Subjective:  Patient completed angiogram today. Only 19 cc of contrast use. Creatinine currently 1.28.   Objective:  Vital signs in last 24 hours:  Temp:  [97.9 F (36.6 C)-98.8 F (37.1 C)] 98.2 F (36.8 C) (12/22 1328) Pulse Rate:  [55-79] 55 (12/22 1531) Resp:  [14-24] 14 (12/22 1531) BP: (103-188)/(46-66) 181/64 (12/22 1531) SpO2:  [91 %-97 %] 92 % (12/22 1531) Weight:  [70.7 kg (155 lb 12.8 oz)] 70.7 kg (155 lb 12.8 oz) (12/21 2152)  Weight change:  Filed Weights   02/27/16 2136 03/01/16 2152  Weight: 71.7 kg (158 lb) 70.7 kg (155 lb 12.8 oz)    Intake/Output: I/O last 3 completed shifts: In: 612.2 [P.O.:240; I.V.:372.2] Out: 1275 [Urine:1275]   Intake/Output this shift:  Total I/O In: 0  Out: 25 [Urine:25]  Physical Exam: General: No acute distress  Head: Normocephalic, atraumatic. Moist oral mucosal membranes  Eyes: Anicteric  Neck: Supple, trachea midline  Lungs:  Clear to auscultation, normal effort  Heart: S1S2 no rubs  Abdomen:  Soft, nontender, bowel sounds present  Extremities: trace peripheral edema.  Neurologic: Nonfocal, moving all four extremities, anxious  Skin: No lesions       Basic Metabolic Panel:  Recent Labs Lab 02/27/16 2205 02/28/16 0844 02/29/16 0621 03/01/16 0402 03/02/16 0456  NA 137 134* 139 138 137  K 4.1 4.1 3.9 4.0 4.1  CL 102 103 106 104 104  CO2 25 23 26 26 27   GLUCOSE 259* 336* 134* 312* 245*  BUN 23* 22* 21* 25* 35*  CREATININE 1.12* 1.30* 1.19* 1.38* 1.28*  CALCIUM 8.8* 8.0* 8.6* 8.5* 8.4*  PHOS  --   --  3.3  --   --     Liver Function Tests:  Recent Labs Lab 02/27/16 2205 02/29/16 0621  AST 22  --   ALT 16  --   ALKPHOS 114  --   BILITOT 0.5  --   PROT 7.6  --   ALBUMIN 3.6 2.9*    Recent Labs Lab 02/27/16 2205  LIPASE 13   No results for input(s): AMMONIA in the last 168 hours.  CBC:  Recent Labs Lab 02/27/16 2205 02/28/16 0844  02/29/16 0021 03/01/16 0402 03/02/16 0456  WBC 13.7* 13.6* 16.6* 17.6* 17.8*  NEUTROABS 11.4*  --   --   --   --   HGB 12.1 11.1* 10.7* 10.0* 9.8*  HCT 37.2 33.3* 32.1* 30.2* 29.8*  MCV 82.8 82.9 82.8 82.8 82.0  PLT 357 325 304 267 281    Cardiac Enzymes:  Recent Labs Lab 02/27/16 2205 02/28/16 0115 02/28/16 0546 02/28/16 1148 02/28/16 1441  TROPONINI 0.03* 0.03* 0.03* <0.03 0.03*    BNP: Invalid input(s): POCBNP  CBG:  Recent Labs Lab 03/01/16 2112 03/01/16 2159 03/02/16 0733 03/02/16 1131 03/02/16 1352  GLUCAP 217* 252* 205* 101* 125*    Microbiology: Results for orders placed or performed during the hospital encounter of 02/27/16  MRSA PCR Screening     Status: None   Collection Time: 02/28/16  2:21 AM  Result Value Ref Range Status   MRSA by PCR NEGATIVE NEGATIVE Final    Comment:        The GeneXpert MRSA Assay (FDA approved for NASAL specimens only), is one component of a comprehensive MRSA colonization surveillance program. It is not intended to diagnose MRSA infection nor to guide or monitor treatment for MRSA infections.     Coagulation Studies: No results for  input(s): LABPROT, INR in the last 72 hours.  Urinalysis: No results for input(s): COLORURINE, LABSPEC, PHURINE, GLUCOSEU, HGBUR, BILIRUBINUR, KETONESUR, PROTEINUR, UROBILINOGEN, NITRITE, LEUKOCYTESUR in the last 72 hours.  Invalid input(s): APPERANCEUR    Imaging: No results found.   Medications:   . heparin Stopped (03/02/16 1341)   . [MAR Hold] amLODipine  5 mg Oral Daily  . [MAR Hold]  ceFAZolin (ANCEF) IV  1 g Intravenous On Call to OR  . [MAR Hold] hydrALAZINE  50 mg Oral Q8H  . [MAR Hold] insulin aspart  0-15 Units Subcutaneous TID WC  . [MAR Hold] insulin glargine  15 Units Subcutaneous Daily  . [MAR Hold] mouth rinse  15 mL Mouth Rinse BID  . [MAR Hold] metoprolol tartrate  50 mg Oral BID  . [MAR Hold] mometasone-formoterol  2 puff Inhalation BID  . [MAR Hold]  pantoprazole  40 mg Oral QAC breakfast   [MAR Hold] acetaminophen **OR** [MAR Hold] acetaminophen, [MAR Hold] alum & mag hydroxide-simeth, [MAR Hold] cloNIDine, [MAR Hold] hydrALAZINE, [MAR Hold] ipratropium-albuterol, [MAR Hold] ondansetron **OR** [MAR Hold] ondansetron (ZOFRAN) IV, [MAR Hold] oxyCODONE-acetaminophen  Assessment/ Plan:  75 y.o. female with a PMHx of Paroxysmal atrial fibrillation, congestive heart failure, coronary artery disease, diabetes mellitus type 2, hyperlipidemia, hypertension, who [facility] for evaluation of  left-sided flank pain. The patient had CT scan of the abdomen and pelvis with contrast upon admission. This showed a hyperperfused right kidney which had decreased in size as compared to the prior recent examination. In addition she was found to have peripheral hypodensities within the left kidney and left renal artery stenosis. This was consistent with left renal infarctions.   1.  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 2.  Malignant hypertension. 3.  Atrial fibrillation with left atrial appendage thrombus. 4.  Left renal infarction/left renal artery stenosis.  Plan:  Patient had renal angiogram performed today. Only 19 cc of contrast was used.  Appreciate vascular surgery assistance in the case. We will start the patient on 0.9 normal saline at 75 cc per hour for post procedure hydration. Continue to monitor renal function trend in the a.m. Continue heparin drip as well given atrophic fibrillation with left atrial appendage clot.  Blood pressure still fluctuating a bit. We will consider increasing hydralazine if blood pressure remains high.   LOS: 3 Robin [name] 12/22/20173:33 PM

## 2016-03-02 NOTE — Progress Notes (Signed)
Inpatient Diabetes Program Recommendations  AACE/ADA: New Consensus Statement on Inpatient Glycemic Control (2015)  Target Ranges:  Prepandial:   less than 140 mg/dL      Peak postprandial:   less than 180 mg/dL (1-2 hours)      Critically ill patients:  140 - 180 mg/dL   Lab Results  Component Value Date   GLUCAP 205 (H) 03/02/2016   HGBA1C 8.7 (H) 02/15/2016    Review of Glycemic Control  Results for Robin [name], Robin [name] (MRN [id number]) as of 03/02/2016 09:40  Ref. Range 02/29/2016 15:52 02/29/2016 21:26 03/01/2016 08:01 03/01/2016 11:24 03/01/2016 16:47 03/01/2016 21:12 03/01/2016 21:59 03/02/2016 07:33  Glucose-Capillary Latest Ref Range: 65 - 99 mg/dL 341 (H) 962 (H) 229 (H) 290 (H) 370 (H) 217 (H) 252 (H) 205 (H)    Home medication: none  Current inpatient medications: Lantus to 15 units daily (0.20units/kg), Novolog correction to moderate correction 0-15 units tid/hs.    Inpatient Diabetes Program Recommendations:   Consider increasing Lantus to 18 units (0.25 units/kg) qday  Susette Racer, RN, BA, MHA, CDE Diabetes Coordinator Inpatient Diabetes Program  [phone] (Team Pager) [phone] [facility] Office) 03/02/2016 9:43 AM

## 2016-03-02 NOTE — Progress Notes (Signed)
ANTICOAGULATION CONSULT NOTE - Initial Consult  Pharmacy Consult for heparin Indication: Renal infarct   Allergies  Allergen Reactions  . Morphine And Related Other (See Comments)    Patient states it makes her "space out"    Patient Measurements: Height: 5\' 4"  (162.6 cm) Weight: 155 lb 12.8 oz (70.7 kg) IBW/kg (Calculated) : 54.7 Heparin Dosing Weight: 69.4 kg  Vital Signs: Temp: 98.2 F (36.8 C) (12/22 1328) Temp Source: Oral (12/22 1328) BP: 181/64 (12/22 1531) Pulse Rate: 55 (12/22 1531)  Labs:  Recent Labs  02/29/16 0021 02/29/16 0621 02/29/16 0844 03/01/16 0402 03/02/16 0456  HGB 10.7*  --   --  10.0* 9.8*  HCT 32.1*  --   --  30.2* 29.8*  PLT 304  --   --  267 281  APTT 89*  --   --   --   --   HEPARINUNFRC 0.60  --  0.62 0.48 0.34  CREATININE  --  1.19*  --  1.38* 1.28*    Estimated Creatinine Clearance: 36.6 mL/min (by C-G formula based on SCr of 1.28 mg/dL (H)).   Medical History: Past Medical History:  Diagnosis Date  . Afib (HCC)   . Anxiety   . Asthma   . CHF (congestive heart failure) (HCC)   . Coronary artery disease   . Diabetes mellitus without complication (HCC)   . Hyperlipidemia   . Hypertension     Medications:  Infusions:  . sodium chloride    . heparin      Assessment: 75 yof cc hypertension presents to ED after recent hospital visit for renal thromboembolism/infarct during which time she started Xarelto. Baseline anti-Xa level < 0.1 suggestive of noncompliance.   Goal of Therapy:  aPTT 66 to 102 seconds  HL 0.3 - 0.7 Monitor platelets by anticoagulation protocol: Yes   Plan:  Heparin drip was stopped for vascular procedure today.   Current orders in to resume heparin drip at previous rate of 1200 units/hr at 1630 today per vascular surgery.  HL ordered for 2030 per instructions. CBC ordered with AM labs tomorrow.  Cindi Carbon, Pharm.D., BCPS Clinical Pharmacist 03/02/2016,3:47 PM

## 2016-03-02 NOTE — Progress Notes (Signed)
[employer] [facility] - [place] at [facility]   PATIENT NAME: Robin [name]    MR#:  [id number]  DATE OF BIRTH:  [birth year]  SUBJECTIVE:   - feels short of breath, anxious - BP improving, off nicardipine drip now . Also on heparin drip for renal infarct  REVIEW OF SYSTEMS:  Review of Systems  Constitutional: Positive for malaise/fatigue. Negative for chills and fever.  HENT: Negative for ear discharge, ear pain, hearing loss and nosebleeds.   Eyes: Negative for blurred vision, double vision and photophobia.  Respiratory: Positive for shortness of breath. Negative for cough, hemoptysis and wheezing.   Cardiovascular: Negative for chest pain, palpitations, orthopnea and leg swelling.  Gastrointestinal: Positive for abdominal pain. Negative for constipation, diarrhea, heartburn, melena, nausea and vomiting.  Genitourinary: Negative for dysuria.  Musculoskeletal: Negative for myalgias.  Skin: Negative for rash.  Neurological: Negative for dizziness, sensory change, speech change, focal weakness and headaches.  Endo/Heme/Allergies: Does not bruise/bleed easily.  Psychiatric/Behavioral: Negative for depression. The patient is nervous/anxious.     DRUG ALLERGIES:   Allergies  Allergen Reactions  . Morphine And Related Other (See Comments)    Patient states it makes her "space out"    VITALS:  Blood pressure (!) 164/54, pulse 63, temperature 98.2 F (36.8 C), temperature source Oral, resp. rate 14, height 5\' 4"  (1.626 m), weight 70.7 kg (155 lb 12.8 oz), SpO2 92 %.  PHYSICAL EXAMINATION:  Physical Exam  GENERAL:  75 y.o.-year-old patient lying in the bed with no acute distress.  EYES: Pupils equal, round, reactive to light and accommodation. No scleral icterus. Extraocular muscles intact.  HEENT: Head atraumatic, normocephalic. Oropharynx and nasopharynx clear.  NECK:  Supple, no jugular venous distention. No thyroid enlargement, no tenderness.  LUNGS: improved  breath sounds bilaterally. Scattered expiratory wheezes heard. No rales,rhonchi or crepitation. No use of accessory muscles of respiration.  CARDIOVASCULAR: S1, S2 normal. No murmurs, rubs, or gallops.  ABDOMEN: Soft, nontender, nondistended. Bowel sounds present. No organomegaly or mass.  EXTREMITIES: No pedal edema, cyanosis, or clubbing.  NEUROLOGIC: Cranial nerves II through XII are intact. Muscle strength 5/5 in all extremities. Sensation intact. Gait not checked.  PSYCHIATRIC: The patient is alert and oriented x 3. Very anxious. SKIN: No obvious rash, lesion, or ulcer.    LABORATORY PANEL:   CBC  Recent Labs Lab 03/02/16 0456  WBC 17.8*  HGB 9.8*  HCT 29.8*  PLT 281   ------------------------------------------------------------------------------------------------------------------  Chemistries   Recent Labs Lab 02/27/16 2205  03/02/16 0456  NA 137  < > 137  K 4.1  < > 4.1  CL 102  < > 104  CO2 25  < > 27  GLUCOSE 259*  < > 245*  BUN 23*  < > 35*  CREATININE 1.12*  < > 1.28*  CALCIUM 8.8*  < > 8.4*  AST 22  --   --   ALT 16  --   --   ALKPHOS 114  --   --   BILITOT 0.5  --   --   < > = values in this interval not displayed. ------------------------------------------------------------------------------------------------------------------  Cardiac Enzymes  Recent Labs Lab 02/28/16 1441  TROPONINI 0.03*   ------------------------------------------------------------------------------------------------------------------  RADIOLOGY:  No results found.  EKG:   Orders placed or performed during the hospital encounter of 02/27/16  . EKG 12-Lead  . EKG 12-Lead    ASSESSMENT AND PLAN:   75 year old female with past medical history significant for atrial fibrillation with several embolic  events, discontinued her Xarelto as an outpatient, recent admission with right renal artery stenosis, depression and anxiety, CAD, hypertension and diabetes presents to  hospital secondary to left flank pain and noted to have left renal infarct.  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 -Appreciate vascular surgery consult. Currently on heparin drip. -Also recent right renal artery thrombosis - Monitor renal function again -Patient was seen by Dr. Gilda CreaseSchnier underwent left renal angiogram with stent placement in the left renal artery on 03/02/2016 -Pain is improved now.  #2 atrial fibrillation-rate controlled at this time. Importance of anticoagulation has been discussed again. Currently on heparin drip. -We'll change to oral Xarelto from tomorrow patient has been noncompliant with it at home. She was recommended the necessity for taking it given her history of atherosclerosis  #3 hypertensive urgency-especially with the renal artery stenosis/ occlusion, blood pressure has been elevated  -off nicardipine drip. On oral metoprolol and Norvasc. Oral hydralazine added  #4 acute dyspnea- improving. Secondary to reactive airway disease. On 2l o2 today -One dose of Solu-Medrol received with improvement and nebs have been ordered.   #5 diabetes mellitus-continue Lantus and also on sliding scale insulin.  #6 DVT prophylaxis- on heparin drip   Physical therapy to see patient tomorrow.  All the records are reviewed and case discussed with Care Management/Social Workerr. Management plans discussed with the patient, family and they are in agreement.  CODE STATUS: Full code  TOTAL  CARE TIME SPENT IN TAKING CARE OF THIS PATIENT: 30minutes.   POSSIBLE D/C IN 1-2 DAYS, DEPENDING ON CLINICAL CONDITION.   Quest Tavenner M.D on 03/02/2016 at 3:51 PM  Between 7am to 6pm - Pager - [phone]  After 6pm go to [email or web address] - password [email or web address]  [facility] [place] [facility]  Office  [phone]  CC: Primary care physician; Leanna SatoMILES,LINDA M, MD

## 2016-03-03 LAB — GLUCOSE, CAPILLARY
GLUCOSE-CAPILLARY: 202 mg/dL — AB (ref 65–99)
Glucose-Capillary: 310 mg/dL — ABNORMAL HIGH (ref 65–99)
Glucose-Capillary: 83 mg/dL (ref 65–99)
Glucose-Capillary: 152 mg/dL — ABNORMAL HIGH (ref 65–99)

## 2016-03-03 LAB — BASIC METABOLIC PANEL
Anion gap: 7 (ref 5–15)
BUN: 30 mg/dL — AB (ref 6–20)
CHLORIDE: 104 mmol/L (ref 101–111)
CO2: 24 mmol/L (ref 22–32)
Calcium: 8.2 mg/dL — ABNORMAL LOW (ref 8.9–10.3)
Creatinine, Ser: 1.21 mg/dL — ABNORMAL HIGH (ref 0.44–1.00)
GFR calc Af Amer: 49 mL/min — ABNORMAL LOW (ref >60)
GFR calc non Af Amer: 43 mL/min — ABNORMAL LOW (ref >60)
GLUCOSE: 238 mg/dL — AB (ref 65–99)
POTASSIUM: 4.4 mmol/L (ref 3.5–5.1)
Sodium: 135 mmol/L (ref 135–145)

## 2016-03-03 LAB — CBC
HCT: 29.9 % — ABNORMAL LOW (ref 35.0–47.0)
Hemoglobin: 9.8 g/dL — ABNORMAL LOW (ref 12.0–16.0)
MCH: 27.5 pg (ref 26.0–34.0)
MCHC: 32.9 g/dL (ref 32.0–36.0)
MCV: 83.7 fL (ref 80.0–100.0)
PLATELETS: 260 10*3/uL (ref 150–440)
RBC: 3.58 MIL/uL — ABNORMAL LOW (ref 3.80–5.20)
RDW: 14.3 % (ref 11.5–14.5)
WBC: 10.2 10*3/uL (ref 3.6–11.0)

## 2016-03-03 LAB — TROPONIN I
TROPONIN I: 0.63 ng/mL — AB (ref <0.03)
Troponin I: 1.52 ng/mL (ref <0.03)

## 2016-03-03 LAB — HEPARIN LEVEL (UNFRACTIONATED): Heparin Unfractionated: 0.3 IU/mL (ref 0.30–0.70)

## 2016-03-03 MED ORDER — HEPARIN (PORCINE) IN NACL 100-0.45 UNIT/ML-% IJ SOLN
1300.0000 [IU]/h | INTRAMUSCULAR | Status: DC
  Administered 2016-03-04: 1200 [IU]/h via INTRAVENOUS
  Administered 2016-03-05: 1300 [IU]/h via INTRAVENOUS
  Filled 2016-03-03 (×2): qty 250

## 2016-03-03 MED ORDER — DILTIAZEM HCL ER COATED BEADS 180 MG PO CP24
180.0000 mg | ORAL_CAPSULE | Freq: Every day | ORAL | Status: DC
  Administered 2016-03-03 – 2016-03-04 (×2): 180 mg via ORAL
  Filled 2016-03-03 (×3): qty 1

## 2016-03-03 MED ORDER — ATORVASTATIN CALCIUM 20 MG PO TABS
80.0000 mg | ORAL_TABLET | Freq: Every day | ORAL | Status: DC
  Administered 2016-03-03 – 2016-03-04 (×2): 80 mg via ORAL
  Filled 2016-03-03: qty 4

## 2016-03-03 MED ORDER — NITROGLYCERIN 0.4 MG SL SUBL
SUBLINGUAL_TABLET | SUBLINGUAL | Status: AC
  Administered 2016-03-03: 0.4 mg
  Filled 2016-03-03: qty 3

## 2016-03-03 MED ORDER — DILTIAZEM HCL 60 MG PO TABS: 60.0000 mg | ORAL_TABLET | Freq: Three times a day (TID) | ORAL | Status: DC

## 2016-03-03 MED ORDER — RIVAROXABAN 20 MG PO TABS
20.0000 mg | ORAL_TABLET | Freq: Every day | ORAL | Status: DC
  Administered 2016-03-03: 20 mg via ORAL
  Filled 2016-03-03: qty 1

## 2016-03-03 MED ORDER — ASPIRIN 325 MG PO TABS
325.0000 mg | ORAL_TABLET | Freq: Once | ORAL | Status: AC
  Administered 2016-03-03: 325 mg via ORAL
  Filled 2016-03-03: qty 1

## 2016-03-03 MED ORDER — NITROGLYCERIN 0.4 MG SL SUBL: 0.4000 mg | SUBLINGUAL_TABLET | SUBLINGUAL | Status: DC | PRN

## 2016-03-03 NOTE — Progress Notes (Signed)
[employer] - [place] at [facility]   PATIENT NAME: Robin [name]    MR#:  [id number]  DATE OF BIRTH:  September 19, 1940  SUBJECTIVE:   -chest pain last nite, anxious, nausea - BP improving -s/p left RA stent placement  REVIEW OF SYSTEMS:  Review of Systems  Constitutional: Positive for malaise/fatigue. Negative for chills and fever.  HENT: Negative for ear discharge, ear pain, hearing loss and nosebleeds.   Eyes: Negative for blurred vision, double vision and photophobia.  Respiratory: Positive for shortness of breath. Negative for cough, hemoptysis and wheezing.   Cardiovascular: Negative for chest pain, palpitations, orthopnea and leg swelling.  Gastrointestinal: Positive for abdominal pain. Negative for constipation, diarrhea, heartburn, melena, nausea and vomiting.  Genitourinary: Negative for dysuria.  Musculoskeletal: Negative for myalgias.  Skin: Negative for rash.  Neurological: Negative for dizziness, sensory change, speech change, focal weakness and headaches.  Endo/Heme/Allergies: Does not bruise/bleed easily.  Psychiatric/Behavioral: Negative for depression. The patient is nervous/anxious.     DRUG ALLERGIES:   Allergies  Allergen Reactions  . Morphine And Related Other (See Comments)    Patient states it makes her "space out"    VITALS:  Blood pressure 125/72, pulse (!) 148, temperature 98.4 F (36.9 C), resp. rate 16, height 5\' 4"  (1.626 m), weight 70.8 kg (156 lb 1.6 oz), SpO2 94 %.  PHYSICAL EXAMINATION:  Physical Exam  GENERAL:  75 y.o.-year-old patient lying in the bed with mild acute distress.  EYES: Pupils equal, round, reactive to light and accommodation. No scleral icterus. Extraocular muscles intact.  HEENT: Head atraumatic, normocephalic. Oropharynx and nasopharynx clear.  NECK:  Supple, no jugular venous distention. No thyroid enlargement, no tenderness.  LUNGS: improved breath sounds bilaterally. Scattered expiratory wheezes  heard. No rales,rhonchi or crepitation. No use of accessory muscles of respiration.  CARDIOVASCULAR: S1, S2 normal. No murmurs, rubs, or gallops. tachy ABDOMEN: Soft, nontender, nondistended. Bowel sounds present. No organomegaly or mass.  EXTREMITIES: No pedal edema, cyanosis, or clubbing.  NEUROLOGIC: Cranial nerves II through XII are intact. Muscle strength 5/5 in all extremities. Sensation intact. Gait not checked.  PSYCHIATRIC: The patient is alert and oriented x 3. Very anxious. SKIN: No obvious rash, lesion, or ulcer.    LABORATORY PANEL:   CBC  Recent Labs Lab 03/03/16 0409  WBC 10.2  HGB 9.8*  HCT 29.9*  PLT 260   ------------------------------------------------------------------------------------------------------------------  Chemistries   Recent Labs Lab 02/27/16 2205  03/02/16 0456  NA 137  < > 137  K 4.1  < > 4.1  CL 102  < > 104  CO2 25  < > 27  GLUCOSE 259*  < > 245*  BUN 23*  < > 35*  CREATININE 1.12*  < > 1.28*  CALCIUM 8.8*  < > 8.4*  AST 22  --   --   ALT 16  --   --   ALKPHOS 114  --   --   BILITOT 0.5  --   --   < > = values in this interval not displayed. ------------------------------------------------------------------------------------------------------------------  Cardiac Enzymes  Recent Labs Lab 03/03/16 0656  TROPONINI <0.03   ------------------------------------------------------------------------------------------------------------------  RADIOLOGY:  No results found.  EKG:   Orders placed or performed during the hospital encounter of 02/27/16  . EKG 12-Lead  . EKG 12-Lead  . EKG 12-Lead  . EKG 12-Lead    ASSESSMENT AND PLAN:   74 year old female with past medical history significant for atrial fibrillation with several embolic events, discontinued  her Xarelto as an outpatient, recent admission with right renal artery stenosis, depression and anxiety, CAD, hypertension and diabetes presents to hospital secondary to  left flank pain and noted to have left renal infarct.  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 -Appreciate vascular surgery consult. Currently on heparin drip. -Also recent right renal artery thrombosis - Monitor renal function again -Patient was seen by Dr. Gilda CreaseSchnier underwent left renal angiogram with stent placement in the left renal artery on 03/02/2016. Change IV heparin gtt to po Xarelto. D/w dr Inda MerlinEskow  #2 atrial fibrillation-rate controlled at this time. Importance of anticoagulation has been discussed again. - Currently on heparin drip -We'll change to oral Xarelto from today -tachycardia last nite -cont metoprolol and resume cardizem(home med)  #3 hypertensive urgency resolved  #4 acute dyspnea- improving. Secondary to reactive airway disease. On 2l o2 today -One dose of Solu-Medrol received with improvement and nebs have been ordered.   #5 diabetes mellitus-continue Lantus and also on sliding scale insulin.  #6 DVT prophylaxis- on heparin drip---change to oral xarelto   Physical therapy to see patient today  All the records are reviewed and case discussed with Care Management/Social Workerr. Management plans discussed with the patient, family and they are in agreement.  CODE STATUS: Full code  TOTAL  CARE TIME SPENT IN TAKING CARE OF THIS PATIENT: 30minutes.   POSSIBLE D/C IN 1-2 DAYS, DEPENDING ON CLINICAL CONDITION.   Kinleigh Nault M.D on 03/03/2016 at 7:46 AM  Between 7am to 6pm - Pager - [phone]  After 6pm go to [email or web address] - password [email or web address]  [facility] [place] [facility]  Office  [phone]  CC: Primary care physician; Leanna SatoMILES,LINDA M, MD

## 2016-03-03 NOTE — Progress Notes (Signed)
ANTICOAGULATION CONSULT NOTE - Initial Consult  Pharmacy Consult for Heparin Drip Indication: chest pain/ACS and increasing troponin  Allergies  Allergen Reactions  . Morphine And Related Other (See Comments)    Patient states it makes her "space out"    Patient Measurements: Height: 5\' 4"  (162.6 cm) Weight: 156 lb 1.6 oz (70.8 kg) IBW/kg (Calculated) : 54.7 Heparin Dosing Weight: 70.8 kg  Vital Signs: Temp: 98.1 F (36.7 C) (12/23 2016) Temp Source: Oral (12/23 2016) BP: 136/68 (12/23 2016) Pulse Rate: 113 (12/23 2016)  Labs:  Recent Labs  03/01/16 0402 03/02/16 0456 03/02/16 2035 03/03/16 0409 03/03/16 0656 03/03/16 1309 03/03/16 1930  HGB 10.0* 9.8*  --  9.8*  --   --   --   HCT 30.2* 29.8*  --  29.9*  --   --   --   PLT 267 281  --  260  --   --   --   HEPARINUNFRC 0.48 0.34 0.26* 0.30  --   --   --   CREATININE 1.38* 1.28*  --   --   --  1.21*  --   TROPONINI  --   --   --   --  <0.03 0.63* 1.52*    Estimated Creatinine Clearance: 38.7 mL/min (by C-G formula based on SCr of 1.21 mg/dL (H)).   Medical History: Past Medical History:  Diagnosis Date  . Afib (HCC)   . Anxiety   . Asthma   . CHF (congestive heart failure) (HCC)   . Coronary artery disease   . Diabetes mellitus without complication (HCC)   . Hyperlipidemia   . Hypertension     Assessment: Patient with Afib and s/p renal artery stents. Patient was transitioned from Heparin infusion to Xarelto this morning. Patient now with increasing troponins, pharmacy consulted to restart Heparin infusion. Last dose of Xarelto was 12/23 @ 08:30.  Goal of Therapy:  Heparin level 0.3-0.7 units/ml Monitor platelets by anticoagulation protocol: Yes   Plan:  Will restart Heparin infusion at previous rate of 1200 units/hr beginning at 08:30 on 12/24. Will get baseline HL and aPTT with AM labs.  Clovia Cuff, PharmD, BCPS 03/03/2016 9:45 PM

## 2016-03-03 NOTE — Progress Notes (Signed)
Patient requested an Advance Directive. Information was given and reviewed. Advance Directive completed. Jefm Petty [phone]

## 2016-03-03 NOTE — Progress Notes (Signed)
Pt's HR in the 130-140's. Some chest discomofrt and nausea Got IV zofran and po BB with oxycodone  Will resume oral CCB (home med) Prn cardizem if needed  Pt is anxious!!

## 2016-03-03 NOTE — Progress Notes (Signed)
[facility]  ROUNDING NOTE   Subjective:  BNP is currently pending. Patient had an episode of atrial fibrillation with rapid ventricular response earlier today. She had associated chest pain however troponin was found to be negative.    Objective:  Vital signs in last 24 hours:  Temp:  [97.7 F (36.5 C)-98.4 F (36.9 C)] 97.7 F (36.5 C) (12/23 1215) Pulse Rate:  [55-155] 101 (12/23 1215) Resp:  [14-21] 20 (12/23 1215) BP: (125-182)/(50-92) 153/70 (12/23 1215) SpO2:  [90 %-97 %] 94 % (12/23 1215) Weight:  [70.8 kg (156 lb 1.6 oz)] 70.8 kg (156 lb 1.6 oz) (12/23 0433)  Weight change: 0.136 kg (4.8 oz) Filed Weights   02/27/16 2136 03/01/16 2152 03/03/16 0433  Weight: 71.7 kg (158 lb) 70.7 kg (155 lb 12.8 oz) 70.8 kg (156 lb 1.6 oz)    Intake/Output: I/O last 3 completed shifts: In: 1303.6 [P.O.:240; I.V.:1063.6] Out: 625 [Urine:625]   Intake/Output this shift:  No intake/output data recorded.  Physical Exam: General: No acute distress  Head: Normocephalic, atraumatic. Moist oral mucosal membranes  Eyes: Anicteric  Neck: Supple, trachea midline  Lungs:  Clear to auscultation, normal effort  Heart: S1S2 Irregular   Abdomen:  Soft, nontender, bowel sounds present  Extremities: trace peripheral edema.  Neurologic: Nonfocal, moving all four extremities, anxious  Skin: No lesions       Basic Metabolic Panel:  Recent Labs Lab 02/27/16 2205 02/28/16 0844 02/29/16 0621 03/01/16 0402 03/02/16 0456  NA 137 134* 139 138 137  K 4.1 4.1 3.9 4.0 4.1  CL 102 103 106 104 104  CO2 25 23 26 26 27   GLUCOSE 259* 336* 134* 312* 245*  BUN 23* 22* 21* 25* 35*  CREATININE 1.12* 1.30* 1.19* 1.38* 1.28*  CALCIUM 8.8* 8.0* 8.6* 8.5* 8.4*  PHOS  --   --  3.3  --   --     Liver Function Tests:  Recent Labs Lab 02/27/16 2205 02/29/16 0621  AST 22  --   ALT 16  --   ALKPHOS 114  --   BILITOT 0.5  --   PROT 7.6  --   ALBUMIN 3.6 2.9*    Recent Labs Lab  02/27/16 2205  LIPASE 13   No results for input(s): AMMONIA in the last 168 hours.  CBC:  Recent Labs Lab 02/27/16 2205 02/28/16 0844 02/29/16 0021 03/01/16 0402 03/02/16 0456 03/03/16 0409  WBC 13.7* 13.6* 16.6* 17.6* 17.8* 10.2  NEUTROABS 11.4*  --   --   --   --   --   HGB 12.1 11.1* 10.7* 10.0* 9.8* 9.8*  HCT 37.2 33.3* 32.1* 30.2* 29.8* 29.9*  MCV 82.8 82.9 82.8 82.8 82.0 83.7  PLT 357 325 304 267 281 260    Cardiac Enzymes:  Recent Labs Lab 02/28/16 0115 02/28/16 0546 02/28/16 1148 02/28/16 1441 03/03/16 0656  TROPONINI 0.03* 0.03* <0.03 0.03* <0.03    BNP: Invalid input(s): POCBNP  CBG:  Recent Labs Lab 03/02/16 1352 03/02/16 1634 03/02/16 2042 03/03/16 0729 03/03/16 1217  GLUCAP 125* 130* 216* 310* 202*    Microbiology: Results for orders placed or performed during the hospital encounter of 02/27/16  MRSA PCR Screening     Status: None   Collection Time: 02/28/16  2:21 AM  Result Value Ref Range Status   MRSA by PCR NEGATIVE NEGATIVE Final    Comment:        The GeneXpert MRSA Assay (FDA approved for NASAL specimens only), is one  component of a comprehensive MRSA colonization surveillance program. It is not intended to diagnose MRSA infection nor to guide or monitor treatment for MRSA infections.     Coagulation Studies: No results for input(s): LABPROT, INR in the last 72 hours.  Urinalysis: No results for input(s): COLORURINE, LABSPEC, PHURINE, GLUCOSEU, HGBUR, BILIRUBINUR, KETONESUR, PROTEINUR, UROBILINOGEN, NITRITE, LEUKOCYTESUR in the last 72 hours.  Invalid input(s): APPERANCEUR    Imaging: No results found.   Medications:    . diltiazem  180 mg Oral Daily  . hydrALAZINE  50 mg Oral Q8H  . insulin aspart  0-15 Units Subcutaneous TID WC  . insulin glargine  15 Units Subcutaneous Daily  . mouth rinse  15 mL Mouth Rinse BID  . metoprolol tartrate  50 mg Oral BID  . mometasone-formoterol  2 puff Inhalation BID  .  pantoprazole  40 mg Oral QAC breakfast  . rivaroxaban  20 mg Oral Daily   acetaminophen **OR** acetaminophen, alum & mag hydroxide-simeth, hydrALAZINE, ipratropium-albuterol, nitroGLYCERIN, ondansetron **OR** ondansetron (ZOFRAN) IV, ondansetron, oxyCODONE-acetaminophen  Assessment/ Plan:  75 y.o. female with a PMHx of Paroxysmal atrial fibrillation, congestive heart failure, coronary artery disease, diabetes mellitus type 2, hyperlipidemia, hypertension, who was admitted to [facility] on 02/27/2016 for evaluation of  left-sided flank pain. The patient had CT scan of the abdomen and pelvis with contrast upon admission. This showed a hyperperfused right kidney which had decreased in size as compared to the prior recent examination. In addition she was found to have peripheral hypodensities within the left kidney and left renal artery stenosis. This was consistent with left renal infarctions.   1.  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 2.  Malignant hypertension. 3.  Atrial fibrillation with left atrial appendage thrombus. 4.  Left renal infarction/left renal artery stenosis s/p renal angiogram and left renal artery stent placement 03/02/16.  Plan:  Patient doing well after placement of left renal artery stent. We are awaiting renal function testing today.  Hopefully the intervention will stabilize her renal function. She has been started back on anticoagulation orally.  Blood pressure currently 153/70. She will be maintained on diltiazem, hydralazine, and metoprolol. Further plan as per hospitalist and vascular surgery.   LOS: 4 Robin [name] 12/23/201712:29 PM

## 2016-03-03 NOTE — Progress Notes (Signed)
Patient troponin increased from 0.63 to 1.52. Patient has had no complaints of chest pain tonight on this shift. MD Hugelmeyer notified. MD to look over patients chart. Will continue to monitor.   Mayra Neer M

## 2016-03-03 NOTE — Progress Notes (Signed)
Patient ID: Robin [name], female   DOB: 16-May-1940, 75 y.o.   MRN: [id number]   Called by nursing regarding increasing troponins 0.03-->0.63-->1.52.  Per chart, trops were ordered secondary to chest pain last night and this morning.  EKG this am is rapid afib at 134bpm with St depression in I, II.  Stat EKG ordered and is pending.   Patient was switched from heparin drip to Xarelto this morning at 0830.    Per pharmacy, can start heparin at 0830 on 03/04/16. Ok with vascular to give aspirin. Patient already on beta blocker and is allergic to morphine. Will add statin, nitro and reconsult cardiology.  Continue to monitor.

## 2016-03-03 NOTE — Progress Notes (Signed)
PT Cancellation Note  Patient Details Name: Robin [name] MRN: [id number] DOB: 07-15-1940   Cancelled Treatment:    Reason Eval/Treat Not Completed: Patient declined, no reason specified. Spoke with nursing prior to attempt to obtain approval for participation, as pt had elevated heart rate this morning per chart review. Nursing states heart rate improved and may proceed with PT. Treatment attempted, but pt refuses noting she is still not feeling well since episode earlier this morning and continues with nausea. Pt states, "maybe tomorrow". Re attempt at a later date, as the schedule allows.    Scot Dock, PTA 03/03/2016, 12:33 PM

## 2016-03-03 NOTE — Progress Notes (Addendum)
Patient begin complaining of chest pain. Patient intially requested maalox due to history of indigestion. 15 minutes later patient was yelling about pain administered oxycodone. Returned to find patient diaphoretic and heart rate elevated int he 150's blood pressure is stable. Notified Dr.Pyreddy of findings. Administered 1 sl nitro and metoprolol. Patient is camly resting in bed. Patient converted to Afib RVR about 2100, administered po metoprolol and heart rate decreased. Dr. Hardin Negus was notified of patient's initial rhythm and rate change, but no new orders were given.

## 2016-03-04 LAB — BASIC METABOLIC PANEL
Anion gap: 8 (ref 5–15)
BUN: 25 mg/dL — ABNORMAL HIGH (ref 6–20)
CALCIUM: 8.6 mg/dL — AB (ref 8.9–10.3)
CHLORIDE: 102 mmol/L (ref 101–111)
CO2: 26 mmol/L (ref 22–32)
CREATININE: 1.09 mg/dL — AB (ref 0.44–1.00)
GFR calc non Af Amer: 48 mL/min — ABNORMAL LOW (ref >60)
GFR, EST AFRICAN AMERICAN: 56 mL/min — AB (ref >60)
GLUCOSE: 168 mg/dL — AB (ref 65–99)
Potassium: 4.2 mmol/L (ref 3.5–5.1)
Sodium: 136 mmol/L (ref 135–145)

## 2016-03-04 LAB — GLUCOSE, CAPILLARY
GLUCOSE-CAPILLARY: 155 mg/dL — AB (ref 65–99)
GLUCOSE-CAPILLARY: 142 mg/dL — AB (ref 65–99)
Glucose-Capillary: 145 mg/dL — ABNORMAL HIGH (ref 65–99)
Glucose-Capillary: 259 mg/dL — ABNORMAL HIGH (ref 65–99)

## 2016-03-04 LAB — APTT
APTT: 40 s — AB (ref 24–36)
APTT: 68 s — AB (ref 24–36)
aPTT: 50 seconds — ABNORMAL HIGH (ref 24–36)

## 2016-03-04 LAB — HEPARIN LEVEL (UNFRACTIONATED): HEPARIN UNFRACTIONATED: 1.53 [IU]/mL — AB (ref 0.30–0.70)

## 2016-03-04 MED ORDER — GLIPIZIDE 5 MG PO TABS
2.5000 mg | ORAL_TABLET | Freq: Every day | ORAL | Status: DC
  Administered 2016-03-05: 2.5 mg via ORAL
  Filled 2016-03-04: qty 1

## 2016-03-04 MED ORDER — HYDRALAZINE HCL 50 MG PO TABS
100.0000 mg | ORAL_TABLET | Freq: Three times a day (TID) | ORAL | Status: DC
  Administered 2016-03-04 – 2016-03-05 (×3): 100 mg via ORAL
  Filled 2016-03-04 (×3): qty 2

## 2016-03-04 NOTE — Progress Notes (Signed)
[facility] [place] [facility]   PATIENT NAME: Robin [name]    MR#:  [id number]  DATE OF BIRTH:  11/24/1940  SUBJECTIVE:  -Complains of acid reflux -chest pain , anxious, nausea - BP improving -s/p left RA stent placement  REVIEW OF SYSTEMS:  Review of Systems  Constitutional: Positive for malaise/fatigue. Negative for chills and fever.  HENT: Negative for ear discharge, ear pain, hearing loss and nosebleeds.   Eyes: Negative for blurred vision, double vision and photophobia.  Respiratory: Positive for shortness of breath. Negative for cough, hemoptysis and wheezing.   Cardiovascular: Negative for chest pain, palpitations, orthopnea and leg swelling.  Gastrointestinal: Positive for abdominal pain. Negative for constipation, diarrhea, heartburn, melena, nausea and vomiting.  Genitourinary: Negative for dysuria.  Musculoskeletal: Negative for myalgias.  Skin: Negative for rash.  Neurological: Negative for dizziness, sensory change, speech change, focal weakness and headaches.  Endo/Heme/Allergies: Does not bruise/bleed easily.  Psychiatric/Behavioral: Negative for depression. The patient is nervous/anxious.     DRUG ALLERGIES:   Allergies  Allergen Reactions  . Morphine And Related Other (See Comments)    Patient states it makes her "space out"    VITALS:  Blood pressure (!) 159/90, pulse 76, temperature 98 F (36.7 C), temperature source Oral, resp. rate 19, height 5\' 4"  (1.626 m), weight 70.8 kg (156 lb 1.6 oz), SpO2 93 %.  PHYSICAL EXAMINATION:  Physical Exam  GENERAL:  75 y.o.-year-old patient lying in the bed with mild acute distress.  EYES: Pupils equal, round, reactive to light and accommodation. No scleral icterus. Extraocular muscles intact.  HEENT: Head atraumatic, normocephalic. Oropharynx and nasopharynx clear.  NECK:  Supple, no jugular venous distention. No thyroid enlargement, no tenderness.  LUNGS: improved breath sounds  bilaterally. Scattered expiratory wheezes heard. No rales,rhonchi or crepitation. No use of accessory muscles of respiration.  CARDIOVASCULAR: S1, S2 normal. No murmurs, rubs, or gallops. tachy ABDOMEN: Soft, nontender, nondistended. Bowel sounds present. No organomegaly or mass.  EXTREMITIES: No pedal edema, cyanosis, or clubbing.  NEUROLOGIC: Cranial nerves II through XII are intact. Muscle strength 5/5 in all extremities. Sensation intact. Gait not checked.  PSYCHIATRIC: The patient is alert and oriented x 3. Very anxious. SKIN: No obvious rash, lesion, or ulcer.    LABORATORY PANEL:   CBC  Recent Labs Lab 03/03/16 0409  WBC 10.2  HGB 9.8*  HCT 29.9*  PLT 260   ------------------------------------------------------------------------------------------------------------------  Chemistries   Recent Labs Lab 02/27/16 2205  03/03/16 1309  NA 137  < > 135  K 4.1  < > 4.4  CL 102  < > 104  CO2 25  < > 24  GLUCOSE 259*  < > 238*  BUN 23*  < > 30*  CREATININE 1.12*  < > 1.21*  CALCIUM 8.8*  < > 8.2*  AST 22  --   --   ALT 16  --   --   ALKPHOS 114  --   --   BILITOT 0.5  --   --   < > = values in this interval not displayed. ------------------------------------------------------------------------------------------------------------------  Cardiac Enzymes  Recent Labs Lab 03/03/16 1930  TROPONINI 1.52*   ------------------------------------------------------------------------------------------------------------------  RADIOLOGY:  No results found.  EKG:   Orders placed or performed during the hospital encounter of 02/27/16  . EKG 12-Lead  . EKG 12-Lead  . EKG 12-Lead  . EKG 12-Lead  . EKG 12-Lead  . EKG 12-Lead    ASSESSMENT AND PLAN:   75 year old female  with past medical history significant for atrial fibrillation with several embolic events, discontinued her Xarelto as an outpatient, recent admission with right renal artery stenosis, depression and  anxiety, CAD, hypertension and diabetes presents to hospital secondary to left flank pain and noted to have left renal infarct.  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 -Appreciate vascular surgery consult. Currently on heparin drip. -Also recent right renal artery thrombosis - Monitor renal function again -Patient was seen by Dr. Gilda Crease underwent left renal angiogram with stent placement in the left renal artery on 03/02/2016.  -Patient was on oral Xarelto now back on IV heparin due to elevated troponin  #2 atrial fibrillation-rate controlled at this time. Importance of anticoagulation has been discussed again. - Currently on heparin drip -tachycardia last nite -cont metoprolol and resume cardizem(home med)  #3 acute mild non-ST MI with elevated troponin up to 1.5. Patient continues to have chest pain she has history of coronary artery disease.  -Discussed with Dr. Rhae Lerner cardiology. For now continue medical management. Patient is a very high risk for acute MI given her severe vasculopathic. She will continue her heparin drip optimized her medical management. -Given her recent renal infarct she is not a candidate for cardiac catheterization however overall still defer this to cardiology.  #4 acute dyspnea- improving. Secondary to reactive airway disease. On 2l o2 today Stable #5 diabetes mellitus-continue Lantus and also on sliding scale insulin.  #6 DVT prophylaxis- on heparin drip  #7 chronic anxiety  Physical therapy to see patient  Spoke with patient's daughter Scarlette Calico. Medical update given. Daughter understands patient's chronic medical problems and questions answered.  All the records are reviewed and case discussed with Care Management/Social Workerr. Management plans discussed with the patient, family and they are in agreement.  CODE STATUS: Full code  TOTAL  CARE TIME SPENT IN TAKING CARE OF THIS PATIENT:  .   POSSIBLE D/C IN 1-2 DAYS, DEPENDING ON CLINICAL CONDITION.   Lita Flynn M.D on 03/04/2016 at 12:11 PM  Between 7am to 6pm - Pager - [phone]  After 6pm go to [email or web address] - password [email or web address]  [facility]  Office  [phone]  CC: Primary care physician; Leanna Sato, MD

## 2016-03-04 NOTE — Progress Notes (Signed)
[facility]  ROUNDING NOTE   Subjective:  Patient feeling better today. No chest pain at the moment. No new creatinine today. Yesterday creatinine was 1.2.  Objective:  Vital signs in last 24 hours:  Temp:  [97.8 F (36.6 C)-98.1 F (36.7 C)] 98 F (36.7 C) (12/24 1204) Pulse Rate:  [76-123] 76 (12/24 1204) Resp:  [18-19] 19 (12/24 1204) BP: (124-174)/(52-90) 159/90 (12/24 1204) SpO2:  [93 %-97 %] 93 % (12/24 1204)  Weight change:  Filed Weights   02/27/16 2136 03/01/16 2152 03/03/16 0433  Weight: 71.7 kg (158 lb) 70.7 kg (155 lb 12.8 oz) 70.8 kg (156 lb 1.6 oz)    Intake/Output: I/O last 3 completed shifts: In: 1279.6 [P.O.:240; I.V.:1039.6] Out: 850 [Urine:850]   Intake/Output this shift:  Total I/O In: -  Out: 250 [Urine:250]  Physical Exam: General: No acute distress  Head: Normocephalic, atraumatic. Moist oral mucosal membranes  Eyes: Anicteric  Neck: Supple, trachea midline  Lungs:  Clear to auscultation, normal effort  Heart: S1S2 Irregular   Abdomen:  Soft, nontender, bowel sounds present  Extremities: trace peripheral edema.  Neurologic: Nonfocal, moving all four extremities, anxious  Skin: No lesions       Basic Metabolic Panel:  Recent Labs Lab 02/28/16 0844 02/29/16 0621 03/01/16 0402 03/02/16 0456 03/03/16 1309  NA 134* 139 138 137 135  K 4.1 3.9 4.0 4.1 4.4  CL 103 106 104 104 104  CO2 23 26 26 27 24   GLUCOSE 336* 134* 312* 245* 238*  BUN 22* 21* 25* 35* 30*  CREATININE 1.30* 1.19* 1.38* 1.28* 1.21*  CALCIUM 8.0* 8.6* 8.5* 8.4* 8.2*  PHOS  --  3.3  --   --   --     Liver Function Tests:  Recent Labs Lab 02/27/16 2205 02/29/16 0621  AST 22  --   ALT 16  --   ALKPHOS 114  --   BILITOT 0.5  --   PROT 7.6  --   ALBUMIN 3.6 2.9*    Recent Labs Lab 02/27/16 2205  LIPASE 13   No results for input(s): AMMONIA in the last 168 hours.  CBC:  Recent Labs Lab 02/27/16 2205 02/28/16 0844 02/29/16 0021  03/01/16 0402 03/02/16 0456 03/03/16 0409  WBC 13.7* 13.6* 16.6* 17.6* 17.8* 10.2  NEUTROABS 11.4*  --   --   --   --   --   HGB 12.1 11.1* 10.7* 10.0* 9.8* 9.8*  HCT 37.2 33.3* 32.1* 30.2* 29.8* 29.9*  MCV 82.8 82.9 82.8 82.8 82.0 83.7  PLT 357 325 304 267 281 260    Cardiac Enzymes:  Recent Labs Lab 02/28/16 1148 02/28/16 1441 03/03/16 0656 03/03/16 1309 03/03/16 1930  TROPONINI <0.03 0.03* <0.03 0.63* 1.52*    BNP: Invalid input(s): POCBNP  CBG:  Recent Labs Lab 03/03/16 1217 03/03/16 1629 03/03/16 2045 03/04/16 0740 03/04/16 1206  GLUCAP 202* 83 152* 155* 145*    Microbiology: Results for orders placed or performed during the hospital encounter of 02/27/16  MRSA PCR Screening     Status: None   Collection Time: 02/28/16  2:21 AM  Result Value Ref Range Status   MRSA by PCR NEGATIVE NEGATIVE Final    Comment:        The GeneXpert MRSA Assay (FDA approved for NASAL specimens only), is one component of a comprehensive MRSA colonization surveillance program. It is not intended to diagnose MRSA infection nor to guide or monitor treatment for MRSA infections.  Coagulation Studies: No results for input(s): LABPROT, INR in the last 72 hours.  Urinalysis: No results for input(s): COLORURINE, LABSPEC, PHURINE, GLUCOSEU, HGBUR, BILIRUBINUR, KETONESUR, PROTEINUR, UROBILINOGEN, NITRITE, LEUKOCYTESUR in the last 72 hours.  Invalid input(s): APPERANCEUR    Imaging: No results found.   Medications:   . heparin 1,200 Units/hr (03/04/16 0903)   . atorvastatin  80 mg Oral q1800  . diltiazem  180 mg Oral Daily  . [START ON 03/05/2016] glipiZIDE  2.5 mg Oral QAC breakfast  . hydrALAZINE  50 mg Oral Q8H  . insulin aspart  0-15 Units Subcutaneous TID WC  . mouth rinse  15 mL Mouth Rinse BID  . metoprolol tartrate  50 mg Oral BID  . mometasone-formoterol  2 puff Inhalation BID  . pantoprazole  40 mg Oral QAC breakfast   acetaminophen **OR**  acetaminophen, alum & mag hydroxide-simeth, hydrALAZINE, ipratropium-albuterol, nitroGLYCERIN, ondansetron **OR** ondansetron (ZOFRAN) IV, ondansetron, oxyCODONE-acetaminophen  Assessment/ Plan:  75 y.o. female with a PMHx of Paroxysmal atrial fibrillation, congestive heart failure, coronary artery disease, diabetes mellitus type 2, hyperlipidemia, hypertension, who was admitted to [facility] on 02/27/2016 for evaluation of  left-sided flank pain. The patient had CT scan of the abdomen and pelvis with contrast upon admission. This showed a hyperperfused right kidney which had decreased in size as compared to the prior recent examination. In addition she was found to have peripheral hypodensities within the left kidney and left renal artery stenosis. This was consistent with left renal infarctions.   1.  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 2.  Malignant hypertension. 3.  Atrial fibrillation with left atrial appendage thrombus. 4.  Left renal infarction/left renal artery stenosis s/p renal angiogram and left renal artery stent placement 03/02/16.  Plan:  Overall the patient has done well from the perspective of renal angiogram. Minimal contrast was used. Most recent creatinine was 1.2. We will recheck renal function today. It appears that she did have a mild NSTEMI yesterday in the setting of atrial fibrillation. The patient is back on a heparin drip. Continue to monitor renal parameters and avoid nephrotoxins as possible.   LOS: 5 Artemis Loyal 12/24/201712:20 PM

## 2016-03-04 NOTE — Progress Notes (Addendum)
ANTICOAGULATION CONSULT NOTE - Follow up Consult  Pharmacy Consult for Heparin Drip Indication: chest pain/ACS and increasing troponin  Allergies  Allergen Reactions  . Morphine And Related Other (See Comments)    Patient states it makes her "space out"    Patient Measurements: Height: 5\' 4"  (162.6 cm) Weight: 156 lb 1.6 oz (70.8 kg) IBW/kg (Calculated) : 54.7 Heparin Dosing Weight: 70.8 kg  Vital Signs: Temp: 97.8 F (36.6 C) (12/24 0328) Temp Source: Oral (12/24 0328) BP: 124/52 (12/24 0348) Pulse Rate: 79 (12/24 0348)  Labs:  Recent Labs  03/02/16 0456 03/02/16 2035 03/03/16 0409 03/03/16 0656 03/03/16 1309 03/03/16 1930 03/04/16 0551  HGB 9.8*  --  9.8*  --   --   --   --   HCT 29.8*  --  29.9*  --   --   --   --   PLT 281  --  260  --   --   --   --   APTT  --   --   --   --   --   --  40*  HEPARINUNFRC 0.34 0.26* 0.30  --   --   --  1.53*  CREATININE 1.28*  --   --   --  1.21*  --   --   TROPONINI  --   --   --  <0.03 0.63* 1.52*  --     Estimated Creatinine Clearance: 38.7 mL/min (by C-G formula based on SCr of 1.21 mg/dL (H)).   Medical History: Past Medical History:  Diagnosis Date  . Afib (HCC)   . Anxiety   . Asthma   . CHF (congestive heart failure) (HCC)   . Coronary artery disease   . Diabetes mellitus without complication (HCC)   . Hyperlipidemia   . Hypertension     Assessment: Patient with Afib and s/p renal artery stents. Patient was transitioned from Heparin infusion to Xarelto am of 12/23. Patient now with increasing troponins, pharmacy consulted to restart Heparin infusion. Last dose of Xarelto was 12/23 @ 08:30.  Goal of Therapy:  Heparin level 0.3-0.7 units/ml Monitor platelets by anticoagulation protocol: Yes  aPTT 66-102   Plan:  Will restart Heparin infusion at previous rate of 1200 units/hr beginning at 08:30 on 12/24. Will get baseline HL and aPTT with AM labs. 12/24: baseline HL= 1.53,  APTT=40.  Will check aPTT in 6  hrs at 1430.   Bari Mantis PharmD Clinical Pharmacist 03/04/2016

## 2016-03-04 NOTE — Progress Notes (Signed)
Patient blood sugar 259, however patient is refusing insulin. Patient educated on importance of maintaining blood sugar, despite education patient is still refusing.

## 2016-03-04 NOTE — Progress Notes (Addendum)
ANTICOAGULATION CONSULT NOTE - Follow up Consult  Pharmacy Consult for Heparin Drip Indication: chest pain/ACS and increasing troponin  Allergies  Allergen Reactions  . Morphine And Related Other (See Comments)    Patient states it makes her "space out"    Patient Measurements: Height: 5\' 4"  (162.6 cm) Weight: 156 lb 1.6 oz (70.8 kg) IBW/kg (Calculated) : 54.7 Heparin Dosing Weight: 70.8 kg  Vital Signs: Temp: 98 F (36.7 C) (12/24 1204) Temp Source: Oral (12/24 1204) BP: 159/90 (12/24 1204) Pulse Rate: 76 (12/24 1204)  Labs:  Recent Labs  03/02/16 0456 03/02/16 2035 03/03/16 0409 03/03/16 0656 03/03/16 1309 03/03/16 1930 03/04/16 0551 03/04/16 1428  HGB 9.8*  --  9.8*  --   --   --   --   --   HCT 29.8*  --  29.9*  --   --   --   --   --   PLT 281  --  260  --   --   --   --   --   APTT  --   --   --   --   --   --  40* 50*  HEPARINUNFRC 0.34 0.26* 0.30  --   --   --  1.53*  --   CREATININE 1.28*  --   --   --  1.21*  --   --  1.09*  TROPONINI  --   --   --  <0.03 0.63* 1.52*  --   --     Estimated Creatinine Clearance: 43 mL/min (by C-G formula based on SCr of 1.09 mg/dL (H)).   Medical History: Past Medical History:  Diagnosis Date  . Afib (HCC)   . Anxiety   . Asthma   . CHF (congestive heart failure) (HCC)   . Coronary artery disease   . Diabetes mellitus without complication (HCC)   . Hyperlipidemia   . Hypertension     Assessment: Patient with Afib and s/p renal artery stents. Patient was transitioned from Heparin infusion to Xarelto am of 12/23. Patient now with increasing troponins, pharmacy consulted to restart Heparin infusion. Last dose of Xarelto was 12/23 @ 08:30.  Goal of Therapy:  Heparin level 0.3-0.7 units/ml Monitor platelets by anticoagulation protocol: Yes  aPTT 66-102   Plan:  Will restart Heparin infusion at previous rate of 1200 units/hr beginning at 08:30 on 12/24. Will get baseline HL and aPTT with AM labs. 12/24:  baseline HL= 1.53,  APTT=40.  Will check aPTT in 6 hrs at 1430.   12/24: aPTT @ 1428= 50. Will increase Heparin drip to 1300 units/hr. Will check aPTT in 6 hrs at 2130.  12/24 2139 aPTT therapeutic x 1. Continue current rate. Will recheck aPTT and HL in 6 hours. -NAC  12/25 0435 aPTT therapeutic x 2, although HL still elevated. Continue current rate. Will recheck aPTT and HL in AM. -NAC  Bari Mantis PharmD Clinical Pharmacist 03/04/2016

## 2016-03-05 LAB — CBC
HEMATOCRIT: 32.1 % — AB (ref 35.0–47.0)
HEMOGLOBIN: 10.5 g/dL — AB (ref 12.0–16.0)
MCH: 27.5 pg (ref 26.0–34.0)
MCHC: 32.8 g/dL (ref 32.0–36.0)
MCV: 83.9 fL (ref 80.0–100.0)
Platelets: 320 10*3/uL (ref 150–440)
RBC: 3.83 MIL/uL (ref 3.80–5.20)
RDW: 14.8 % — ABNORMAL HIGH (ref 11.5–14.5)
WBC: 10 10*3/uL (ref 3.6–11.0)

## 2016-03-05 LAB — GLUCOSE, CAPILLARY: Glucose-Capillary: 224 mg/dL — ABNORMAL HIGH (ref 65–99)

## 2016-03-05 LAB — HEPARIN LEVEL (UNFRACTIONATED): Heparin Unfractionated: 1.26 IU/mL — ABNORMAL HIGH (ref 0.30–0.70)

## 2016-03-05 LAB — APTT: aPTT: 79 seconds — ABNORMAL HIGH (ref 24–36)

## 2016-03-05 MED ORDER — ATORVASTATIN CALCIUM 80 MG PO TABS: 80.0000 mg | ORAL_TABLET | Freq: Every day | ORAL | 1 refills | Status: DC

## 2016-03-05 MED ORDER — RIVAROXABAN 20 MG PO TABS: 20.0000 mg | ORAL_TABLET | Freq: Every day | ORAL | Status: DC

## 2016-03-05 MED ORDER — DILTIAZEM HCL ER COATED BEADS 180 MG PO CP24: 180.0000 mg | ORAL_CAPSULE | Freq: Every day | ORAL | 0 refills | Status: DC

## 2016-03-05 MED ORDER — RIVAROXABAN 20 MG PO TABS
20.0000 mg | ORAL_TABLET | Freq: Every day | ORAL | Status: DC
  Administered 2016-03-05: 20 mg via ORAL
  Filled 2016-03-05: qty 1

## 2016-03-05 MED ORDER — OXYCODONE-ACETAMINOPHEN 5-325 MG PO TABS: 1.0000 | ORAL_TABLET | ORAL | 0 refills | Status: DC | PRN

## 2016-03-05 MED ORDER — GLIPIZIDE 5 MG PO TABS: 2.5000 mg | ORAL_TABLET | Freq: Every day | ORAL | 1 refills | Status: AC

## 2016-03-05 MED ORDER — MOMETASONE FURO-FORMOTEROL FUM 200-5 MCG/ACT IN AERO
2.0000 | INHALATION_SPRAY | Freq: Two times a day (BID) | RESPIRATORY_TRACT | 0 refills | Status: DC
Start: 2016-03-05 — End: 2016-10-19

## 2016-03-05 NOTE — Progress Notes (Signed)
Pt ambulated on room air and with 2 wheel walker around loop innurses' station. tol very well . Gait steady no undue fatigue

## 2016-03-05 NOTE — Discharge Instructions (Signed)
HHPT °
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
## 2016-03-05 NOTE — Progress Notes (Signed)
[facility]  ROUNDING NOTE   Subjective:  Patient overall in good spirits. Yesterday creatinine was down to 1.09. Denies chest pain at the moment.  Objective:  Vital signs in last 24 hours:  Temp:  [97.8 F (36.6 C)-98.2 F (36.8 C)] 98.2 F (36.8 C) (12/25 0809) Pulse Rate:  [76-105] 105 (12/25 0809) Resp:  [15-19] 18 (12/25 0809) BP: (127-159)/(51-90) 138/74 (12/25 0809) SpO2:  [93 %-98 %] 95 % (12/25 0809)  Weight change:  Filed Weights   02/27/16 2136 03/01/16 2152 03/03/16 0433  Weight: 71.7 kg (158 lb) 70.7 kg (155 lb 12.8 oz) 70.8 kg (156 lb 1.6 oz)    Intake/Output: I/O last 3 completed shifts: In: 238.7 [I.V.:238.7] Out: 500 [Urine:500]   Intake/Output this shift:  Total I/O In: 240 [P.O.:240] Out: -   Physical Exam: General: No acute distress  Head: Normocephalic, atraumatic. Moist oral mucosal membranes  Eyes: Anicteric  Neck: Supple, trachea midline  Lungs:  Clear to auscultation, normal effort  Heart: S1S2 Irregular   Abdomen:  Soft, nontender, bowel sounds present  Extremities: trace peripheral edema.  Neurologic: Nonfocal, moving all four extremities  Skin: No lesions       Basic Metabolic Panel:  Recent Labs Lab 02/29/16 0621 03/01/16 0402 03/02/16 0456 03/03/16 1309 03/04/16 1428  NA 139 138 137 135 136  K 3.9 4.0 4.1 4.4 4.2  CL 106 104 104 104 102  CO2 26 26 27 24 26   GLUCOSE 134* 312* 245* 238* 168*  BUN 21* 25* 35* 30* 25*  CREATININE 1.19* 1.38* 1.28* 1.21* 1.09*  CALCIUM 8.6* 8.5* 8.4* 8.2* 8.6*  PHOS 3.3  --   --   --   --     Liver Function Tests:  Recent Labs Lab 02/27/16 2205 02/29/16 0621  AST 22  --   ALT 16  --   ALKPHOS 114  --   BILITOT 0.5  --   PROT 7.6  --   ALBUMIN 3.6 2.9*    Recent Labs Lab 02/27/16 2205  LIPASE 13   No results for input(s): AMMONIA in the last 168 hours.  CBC:  Recent Labs Lab 02/27/16 2205  02/29/16 0021 03/01/16 0402 03/02/16 0456 03/03/16 0409  03/05/16 0435  WBC 13.7*  < > 16.6* 17.6* 17.8* 10.2 10.0  NEUTROABS 11.4*  --   --   --   --   --   --   HGB 12.1  < > 10.7* 10.0* 9.8* 9.8* 10.5*  HCT 37.2  < > 32.1* 30.2* 29.8* 29.9* 32.1*  MCV 82.8  < > 82.8 82.8 82.0 83.7 83.9  PLT 357  < > 304 267 281 260 320  < > = values in this interval not displayed.  Cardiac Enzymes:  Recent Labs Lab 02/28/16 1148 02/28/16 1441 03/03/16 0656 03/03/16 1309 03/03/16 1930  TROPONINI <0.03 0.03* <0.03 0.63* 1.52*    BNP: Invalid input(s): POCBNP  CBG:  Recent Labs Lab 03/04/16 0740 03/04/16 1206 03/04/16 1658 03/04/16 2135 03/05/16 0752  GLUCAP 155* 145* 142* 259* 224*    Microbiology: Results for orders placed or performed during the hospital encounter of 02/27/16  MRSA PCR Screening     Status: None   Collection Time: 02/28/16  2:21 AM  Result Value Ref Range Status   MRSA by PCR NEGATIVE NEGATIVE Final    Comment:        The GeneXpert MRSA Assay (FDA approved for NASAL specimens only), is one component of a comprehensive  MRSA colonization surveillance program. It is not intended to diagnose MRSA infection nor to guide or monitor treatment for MRSA infections.     Coagulation Studies: No results for input(s): LABPROT, INR in the last 72 hours.  Urinalysis: No results for input(s): COLORURINE, LABSPEC, PHURINE, GLUCOSEU, HGBUR, BILIRUBINUR, KETONESUR, PROTEINUR, UROBILINOGEN, NITRITE, LEUKOCYTESUR in the last 72 hours.  Invalid input(s): APPERANCEUR    Imaging: No results found.   Medications:    . atorvastatin  80 mg Oral q1800  . diltiazem  180 mg Oral Daily  . glipiZIDE  2.5 mg Oral QAC breakfast  . hydrALAZINE  100 mg Oral Q8H  . insulin aspart  0-15 Units Subcutaneous TID WC  . mouth rinse  15 mL Mouth Rinse BID  . metoprolol tartrate  50 mg Oral BID  . mometasone-formoterol  2 puff Inhalation BID  . pantoprazole  40 mg Oral QAC breakfast  . rivaroxaban  20 mg Oral Daily   acetaminophen  **OR** acetaminophen, alum & mag hydroxide-simeth, hydrALAZINE, ipratropium-albuterol, nitroGLYCERIN, ondansetron **OR** ondansetron (ZOFRAN) IV, oxyCODONE-acetaminophen  Assessment/ Plan:  75 y.o. female with a PMHx of Paroxysmal atrial fibrillation, congestive heart failure, coronary artery disease, diabetes mellitus type 2, hyperlipidemia, hypertension, who was admitted to [facility] on 02/27/2016 for evaluation of  left-sided flank pain. The patient had CT scan of the abdomen and pelvis with contrast upon admission. This showed a hyperperfused right kidney which had decreased in size as compared to the prior recent examination. In addition she was found to have peripheral hypodensities within the left kidney and left renal artery stenosis. This was consistent with left renal infarctions.   1.  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 2.  Malignant hypertension. 3.  Atrial fibrillation with left atrial appendage thrombus. 4.  Left renal infarction/left renal artery stenosis s/p renal angiogram and left renal artery stent placement 03/02/16.  Plan:  The patient had left renal artery stent placed on 03/02/2016. She tolerated this quite well. Renal function has remained stable and in fact has improved a bit. Blood pressure under good control now with a blood pressure 138/74. Patient to be continued on anticoagulation for underlying atrial fibrillation which resulted in left renal infarction. We plan to see the patient back in the office in the next week or 2 for follow-up. Thanks for allowing [place] to produce up 8.   LOS: 6 Deanthony Maull 12/25/201710:56 AM

## 2016-03-05 NOTE — Care Management (Signed)
Patient discharging home today. I have notified [place] with [facility] home health that patient is open to APS referral currently and that patient is discharging to home today. I have also notified [facility] that patient has a discharge order. No other RNCM needs. Case closed.

## 2016-03-05 NOTE — Progress Notes (Signed)
ANTICOAGULATION CONSULT NOTE - Initial Consult  Pharmacy Consult for Xarelto (Rvaroxaban)  Indication: Right renal infarct due to thromboembolism/A. Fib  Allergies  Allergen Reactions  . Morphine And Related Other (See Comments)    Patient states it makes her "space out"    Patient Measurements: Height: 5\' 4"  (162.6 cm) Weight: 156 lb 1.6 oz (70.8 kg) IBW/kg (Calculated) : 54.7  Vital Signs: Temp: 98.2 F (36.8 C) (12/25 0809) Temp Source: Oral (12/25 0809) BP: 138/74 (12/25 0809) Pulse Rate: 105 (12/25 0809)  Labs:  Recent Labs  03/03/16 0409 03/03/16 0656 03/03/16 1309 03/03/16 1930  03/04/16 0551 03/04/16 1428 03/04/16 2139 03/05/16 0435  HGB 9.8*  --   --   --   --   --   --   --  10.5*  HCT 29.9*  --   --   --   --   --   --   --  32.1*  PLT 260  --   --   --   --   --   --   --  320  APTT  --   --   --   --   < > 40* 50* 68* 79*  HEPARINUNFRC 0.30  --   --   --   --  1.53*  --   --  1.26*  CREATININE  --   --  1.21*  --   --   --  1.09*  --   --   TROPONINI  --  <0.03 0.63* 1.52*  --   --   --   --   --   < > = values in this interval not displayed.  Estimated Creatinine Clearance: 43 mL/min (by C-G formula based on SCr of 1.09 mg/dL (H)).   Medical History: Past Medical History:  Diagnosis Date  . Afib (HCC)   . Anxiety   . Asthma   . CHF (congestive heart failure) (HCC)   . Coronary artery disease   . Diabetes mellitus without complication (HCC)   . Hyperlipidemia   . Hypertension     Assessment: 75 yo female with Right renal infarct due to thromboembolism related to atrial fibrillation. Pharmacy consulted for Xarelto (Rivaroxaban) dosing.   Plan:  Per Dr Allena Katz wants to start pt on Xarelto 20 mg daily (vs 15 mg BID) as pt has had 4 days of heparin drip and was on BID dosing prior to admission.  Will initiate patient on Xarelto 20mg  daily. Heparin drip has been discontinued.   Gardner Candle, PharmD, BCPS Clinical  Pharmacist 03/05/2016 9:21 AM

## 2016-03-05 NOTE — Discharge Summary (Signed)
[facility] [place] [facility]   PATIENT NAME: Robin [name]    MR#:  [id number]  DATE OF BIRTH:  [birth year]  DATE OF ADMISSION:  02/27/2016 ADMITTING PHYSICIAN: Oralia Manis, MD  DATE OF DISCHARGE: 03/05/16  PRIMARY CARE PHYSICIAN: Leanna Sato, MD    ADMISSION DIAGNOSIS:  Epigastric pain [R10.13] Hypertension, unspecified type [I10]  DISCHARGE DIAGNOSIS:  Acute  Bilateral Renal infarct Left Renal artery stenosis s/p left renal stent placement by Dr Lorretta Harp Acute on CKD-III Acute NSTEMI -medical mnx Chronic afib  Dm-2 Chronic anxiety SECONDARY DIAGNOSIS:   Past Medical History:  Diagnosis Date  . Afib (HCC)   . Anxiety   . Asthma   . CHF (congestive heart failure) (HCC)   . Coronary artery disease   . Diabetes mellitus without complication (HCC)   . Hyperlipidemia   . Hypertension     HOSPITAL COURSE:   75 year old female with past medical history significant for atrial fibrillation with several embolic events, discontinued her Xarelto as an outpatient, recent admission with right renal artery stenosis, depression and anxiety, CAD, hypertension and diabetes presents to hospital secondary to left flank pain and noted to have left renal infarct.  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 -Appreciate vascular surgery consult. Currently on heparin drip---change to oral xarelto  recent right renal artery thrombosis -Patient was seen by Dr. Gilda Crease underwent left renal angiogram with stent placement in the left renal artery on 03/02/2016.   #2 atrial fibrillation-rate controlled at this time. Importance of anticoagulation has been discussed again. - Currently on heparin drip---now changed to po xaretlo at d/c -tachycardia last nite -cont metoprolol and resume cardizem(home med)  #3 acute mild non-ST MI with elevated troponin up to 1.5. Patient continues to have chest  pain she has history of coronary artery disease.  -Discussed with Dr. Gwen Pounds cardiology. For now continue medical management. Patient is a very high risk for acute MI given her severe vasculopathy. - She recieved  heparin drip optimized her medical management. -Given her recent renal infarct she is not a candidate for cardiac catheterization -ok from cardiac standpoint to go home  #4 acute dyspnea- improving. Secondary to reactive airway disease. On 2l o2---wean to RA Stable   #5 diabetes mellitus- Po glipizide and ssi  #6 DVT prophylaxis-on xarelto  #7 chronic anxiety  Physical therapy to see patient   Spoke with patient's daughter Scarlette Calico. Medical update given. Daughter understands patient's chronic medical problems and questions answered.  Overall improved slowly. Will wean off oxygen HHPT to be arranged D/c home CONSULTS OBTAINED:  Treatment Team:  Marcina Millard, MD Renford Dills, MD Audery Amel, MD Mady Haagensen, MD Lamar Blinks, MD  DRUG ALLERGIES:   Allergies  Allergen Reactions  . Morphine And Related Other (See Comments)    Patient states it makes her "space out"    DISCHARGE MEDICATIONS:   Current Discharge Medication List    START taking these medications   Details  atorvastatin (LIPITOR) 80 MG tablet Take 1 tablet (80 mg total) by mouth daily at 6 PM. Qty: 30 tablet, Refills: 1    glipiZIDE (GLUCOTROL) 5 MG tablet Take 0.5 tablets (2.5 mg total) by mouth daily before breakfast. Qty: 30 tablet, Refills: 1    mometasone-formoterol (DULERA) 200-5 MCG/ACT AERO Inhale 2 puffs into the lungs 2 (two) times daily. Qty: 1 Inhaler, Refills: 0    oxyCODONE-acetaminophen (PERCOCET/ROXICET) 5-325 MG tablet Take  1 tablet by mouth every 4 (four) hours as needed for moderate pain or severe pain. Qty: 25 tablet, Refills: 0      CONTINUE these medications which have CHANGED   Details  diltiazem (CARDIZEM CD) 180 MG 24 hr capsule Take 1  capsule (180 mg total) by mouth daily. Qty: 60 capsule, Refills: 0      CONTINUE these medications which have NOT CHANGED   Details  albuterol (ACCUNEB) 1.25 MG/3ML nebulizer solution Take 1.25 mg by nebulization every 4 (four) hours as needed for wheezing or shortness of breath.     meclizine (ANTIVERT) 25 MG tablet Take 1 tablet (25 mg total) by mouth 3 (three) times daily as needed for dizziness. Qty: 30 tablet, Refills: 0    metoprolol (LOPRESSOR) 50 MG tablet Take 1 tablet (50 mg total) by mouth 3 (three) times daily. Qty: 90 tablet, Refills: 0    nitroGLYCERIN (NITROSTAT) 0.4 MG SL tablet Place 1 tablet (0.4 mg total) under the tongue every 5 (five) minutes as needed for chest pain. Qty: 30 tablet, Refills: 0    ondansetron (ZOFRAN ODT) 4 MG disintegrating tablet Take 1 tablet (4 mg total) by mouth every 8 (eight) hours as needed for nausea or vomiting. Qty: 20 tablet, Refills: 0    pantoprazole (PROTONIX) 40 MG tablet Take 1 tablet (40 mg total) by mouth daily. Qty: 30 tablet, Refills: 2    traMADol (ULTRAM) 50 MG tablet Take 1 tablet (50 mg total) by mouth every 12 (twelve) hours as needed for moderate pain or severe pain. Qty: 20 tablet, Refills: 0    rivaroxaban (XARELTO) 20 MG TABS tablet Take 1 tablet (20 mg total) by mouth daily with supper. Qty: 30 tablet, Refills: 0        If you experience worsening of your admission symptoms, develop shortness of breath, life threatening emergency, suicidal or homicidal thoughts you must seek medical attention immediately by calling 911 or calling your MD immediately  if symptoms less severe.  You Must read complete instructions/literature along with all the possible adverse reactions/side effects for all the Medicines you take and that have been prescribed to you. Take any new Medicines after you have completely understood and accept all the possible adverse reactions/side effects.   Please note  You were cared for by a  hospitalist during your hospital stay. If you have any questions about your discharge medications or the care you received while you were in the hospital after you are discharged, you can call the unit and asked to speak with the hospitalist on call if the hospitalist that took care of you is not available. Once you are discharged, your primary care physician will handle any further medical issues. Please note that NO REFILLS for any discharge medications will be authorized once you are discharged, as it is imperative that you return to your primary care physician (or establish a relationship with a primary care physician if you do not have one) for your aftercare needs so that they can reassess your need for medications and monitor your lab values. Today   SUBJECTIVE   Feels a lot better Wants to go home  VITAL SIGNS:  Blood pressure 138/74, pulse (!) 105, temperature 98.2 F (36.8 C), temperature source Oral, resp. rate 18, height 5\' 4"  (1.626 m), weight 70.8 kg (156 lb 1.6 oz), SpO2 95 %.  I/O:   Intake/Output Summary (Last 24 hours) at 03/05/16 0931 Last data filed at 03/05/16 0400  Gross per 24  hour  Intake           238.65 ml  Output                0 ml  Net           238.65 ml    PHYSICAL EXAMINATION:  GENERAL:  75 y.o.-year-old patient lying in the bed with no acute distress.  EYES: Pupils equal, round, reactive to light and accommodation. No scleral icterus. Extraocular muscles intact.  HEENT: Head atraumatic, normocephalic. Oropharynx and nasopharynx clear.  NECK:  Supple, no jugular venous distention. No thyroid enlargement, no tenderness.  LUNGS: Normal breath sounds bilaterally, no wheezing, rales,rhonchi or crepitation. No use of accessory muscles of respiration.  CARDIOVASCULAR: S1, S2 normal. No murmurs, rubs, or gallops.  ABDOMEN: Soft, non-tender, non-distended. Bowel sounds present. No organomegaly or mass.  EXTREMITIES: No pedal edema, cyanosis, or clubbing.   NEUROLOGIC: Cranial nerves II through XII are intact. Muscle strength 5/5 in all extremities. Sensation intact. Gait not checked.  PSYCHIATRIC: The patient is alert and oriented x 3.  SKIN: No obvious rash, lesion, or ulcer.   DATA REVIEW:   CBC   Recent Labs Lab 03/05/16 0435  WBC 10.0  HGB 10.5*  HCT 32.1*  PLT 320    Chemistries   Recent Labs Lab 02/27/16 2205  03/04/16 1428  NA 137  < > 136  K 4.1  < > 4.2  CL 102  < > 102  CO2 25  < > 26  GLUCOSE 259*  < > 168*  BUN 23*  < > 25*  CREATININE 1.12*  < > 1.09*  CALCIUM 8.8*  < > 8.6*  AST 22  --   --   ALT 16  --   --   ALKPHOS 114  --   --   BILITOT 0.5  --   --   < > = values in this interval not displayed.  Microbiology Results   Recent Results (from the past 240 hour(s))  MRSA PCR Screening     Status: None   Collection Time: 02/28/16  2:21 AM  Result Value Ref Range Status   MRSA by PCR NEGATIVE NEGATIVE Final    Comment:        The GeneXpert MRSA Assay (FDA approved for NASAL specimens only), is one component of a comprehensive MRSA colonization surveillance program. It is not intended to diagnose MRSA infection nor to guide or monitor treatment for MRSA infections.     RADIOLOGY:  No results found.   Management plans discussed with the patient, family and they are in agreement.  CODE STATUS:     Code Status Orders        Start     Ordered   03/02/16 1534  Full code  Continuous     03/02/16 1533    Code Status History    Date Active Date Inactive Code Status Order ID Comments User Context   02/28/2016  2:43 AM 03/02/2016  3:33 PM Full Code [id number]  Oralia Manisavid Willis, MD Inpatient   02/06/2016  7:17 PM 02/10/2016 11:14 PM Full Code [id number]  Wyatt Hasteavid K Hower, MD ED   01/24/2016  2:00 PM 01/25/2016  7:15 PM Full Code [id number]  Auburn BilberryShreyang Tiphani Mells, MD Inpatient   11/25/2015  3:40 PM 11/26/2015  3:49 PM Full Code [id number]  [place] SirenVivek J Sainani, MD Inpatient   11/05/2015 12:33 AM 11/06/2015  6:05 PM  Full Code [id number]  Oralia Manisavid Willis, MD Inpatient  09/13/2015 10:10 AM 09/15/2015  7:33 PM Full Code [id number]  Altamese Dilling, MD Inpatient    Advance Directive Documentation   Flowsheet Row Most Recent Value  Type of Advance Directive  Healthcare Power of Attorney, Living will  Pre-existing out of facility DNR order (yellow form or pink MOST form)  No data  "MOST" Form in Place?  No data      TOTAL TIME TAKING CARE OF THIS PATIENT: 40 minutes.    Xiamara Hulet M.D on 03/05/2016 at 9:31 AM  Between 7am to 6pm - Pager - [phone] After 6pm go to [email or web address] - password [email or web address] [facility]  [place] [facility]  Office  [phone]  CC: Primary care physician; Leanna Sato, MD

## 2016-03-07 ENCOUNTER — Encounter: Payer: Self-pay | Admitting: Vascular Surgery

## 2016-03-12 IMAGING — CR DG CHEST 2V
1 series · 2 of 2 positions shown · non-contrast
Comparison: 11/02/2013

CLINICAL DATA: Right-sided breast pain.

EXAM:
CHEST  2 VIEW

[Series 1: dxr chest pa (or ap) and lateral · 0.14mm/px · 2 of 2 slices shown]
[im 1/2]
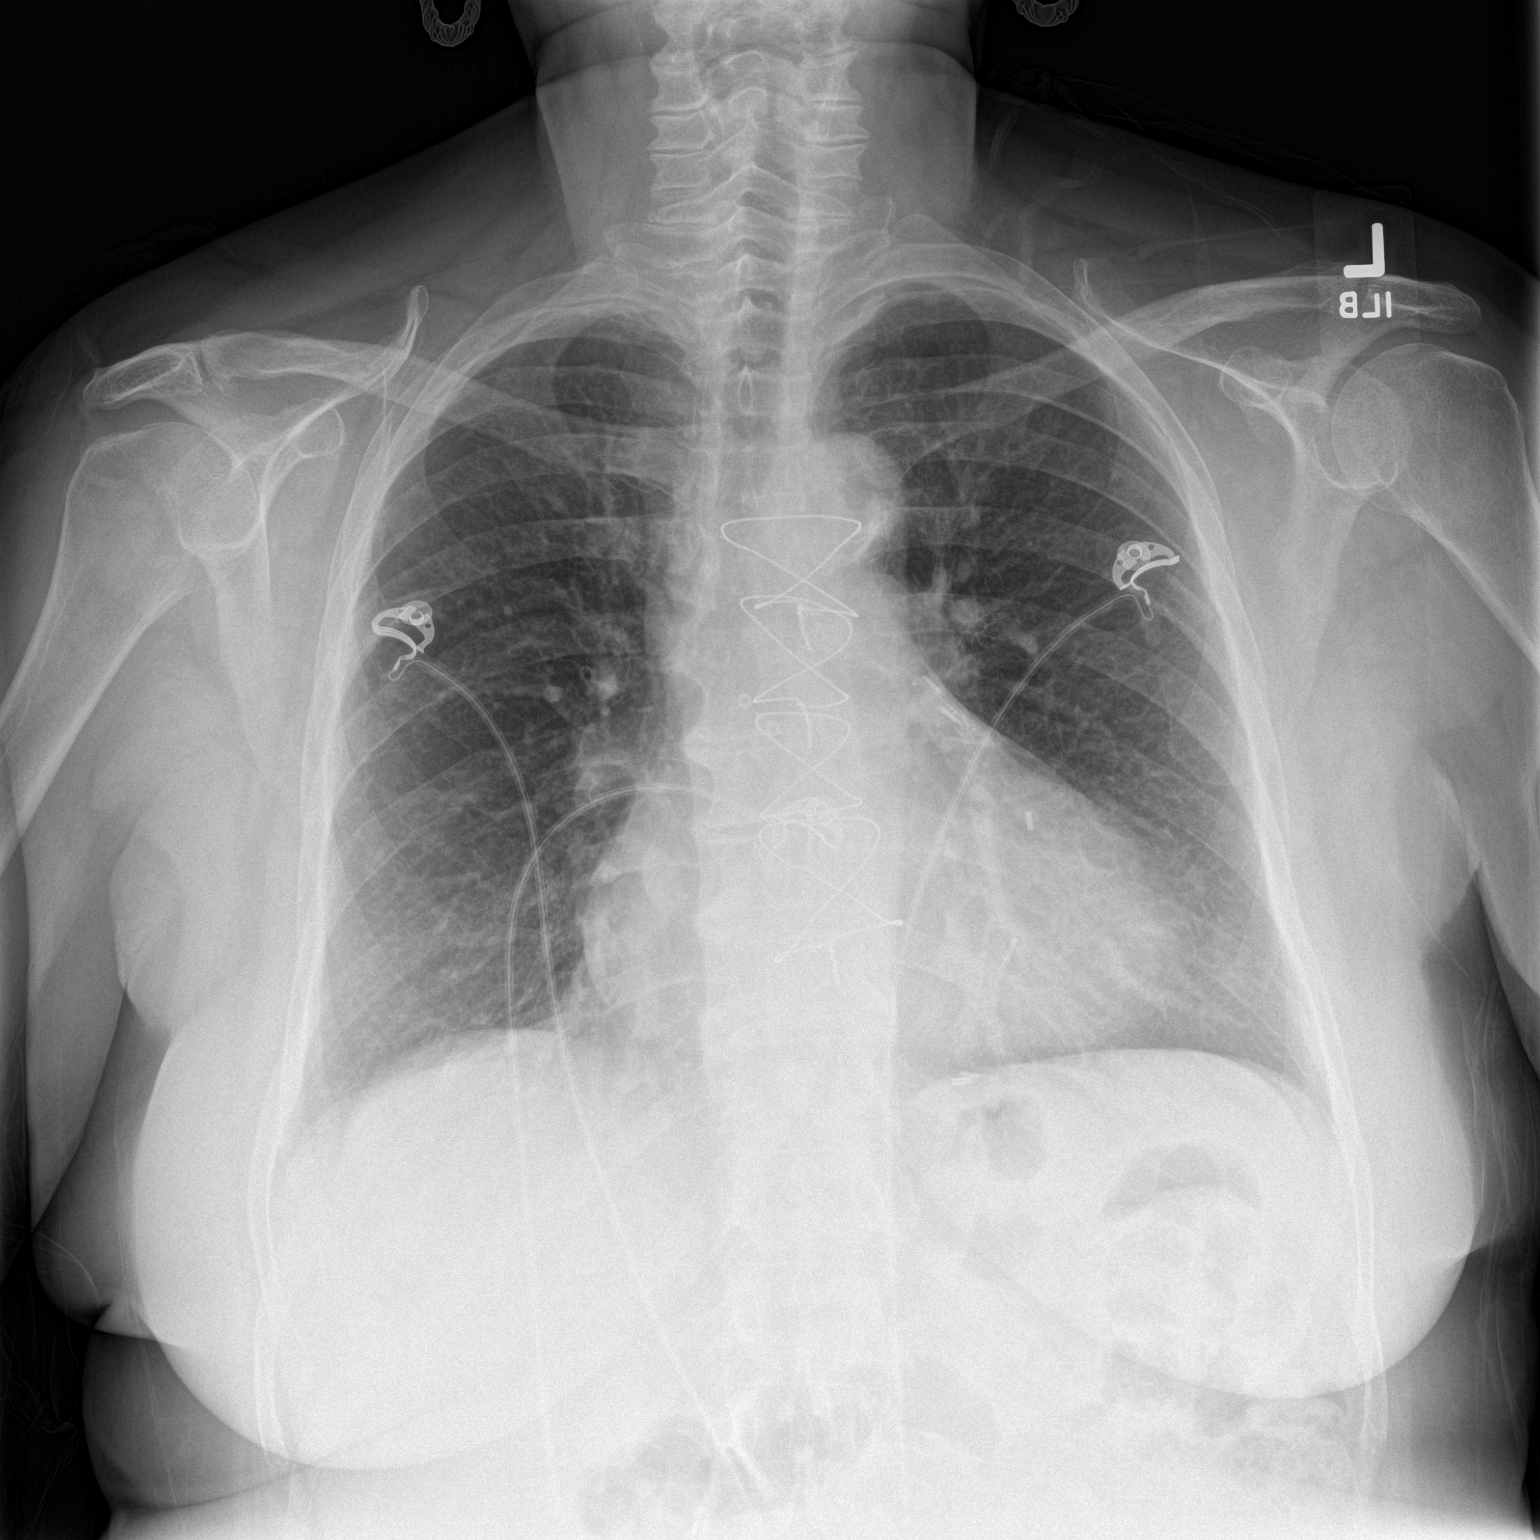
[im 2/2]
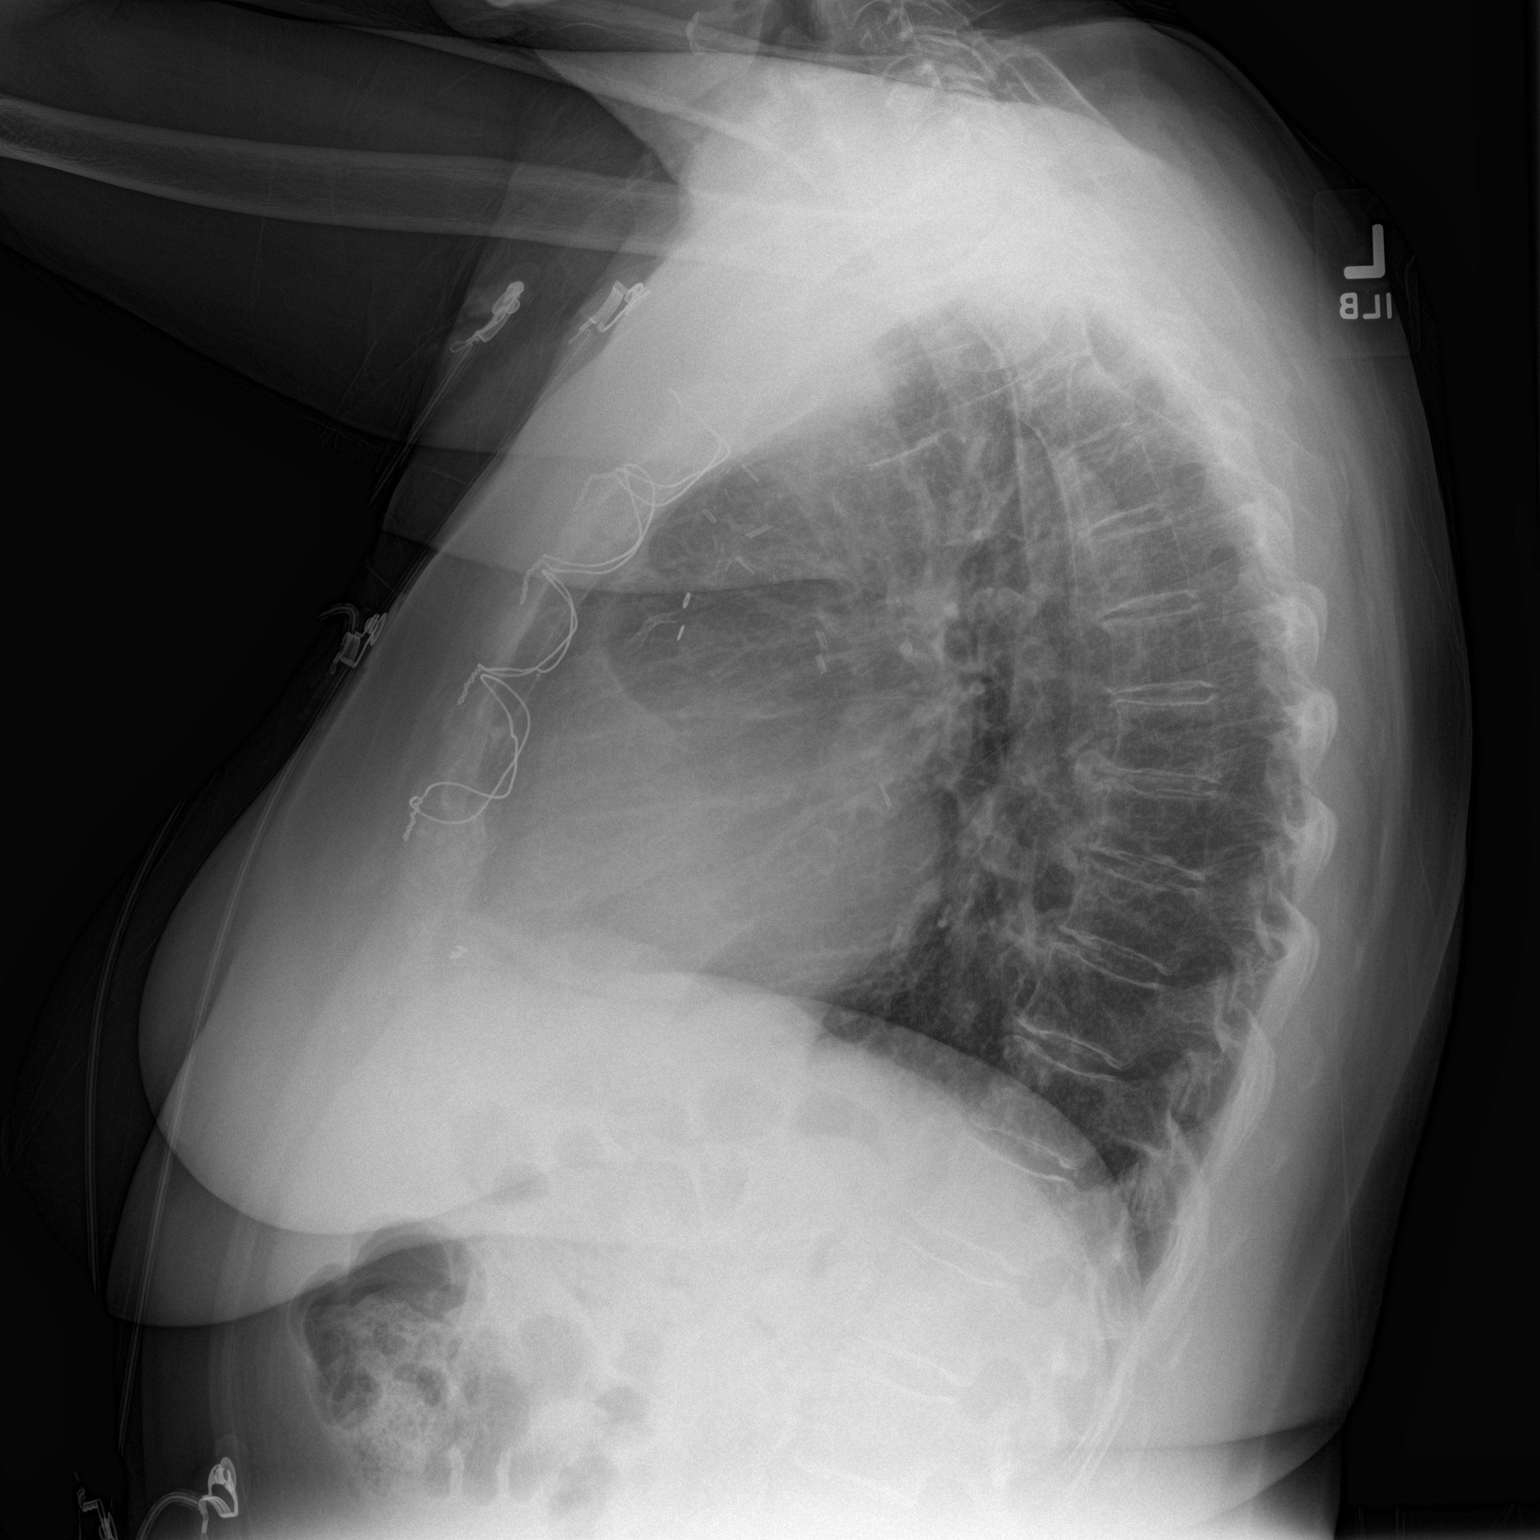

[2 of 2 positions shown; findings below may reference images not displayed]

FINDINGS: Postoperative changes in the mediastinum. The heart size and
mediastinal contours are within normal limits. Both lungs are clear.
The visualized skeletal structures are unremarkable.
IMPRESSION: No active cardiopulmonary disease.

## 2016-03-19 ENCOUNTER — Encounter: Payer: Self-pay | Admitting: Emergency Medicine

## 2016-03-19 ENCOUNTER — Emergency Department
Admission: EM | Admit: 2016-03-19 | Discharge: 2016-03-19 | Disposition: A | Discharge status: Home / Self Care | Payer: Medicare Other | Attending: Emergency Medicine | Admitting: Emergency Medicine

## 2016-03-19 ENCOUNTER — Encounter (INDEPENDENT_AMBULATORY_CARE_PROVIDER_SITE_OTHER): Payer: Medicare Other | Admitting: Vascular Surgery

## 2016-03-19 DIAGNOSIS — E119 Type 2 diabetes mellitus without complications: Secondary | ICD-10-CM | POA: Diagnosis not present

## 2016-03-19 DIAGNOSIS — R1030 Lower abdominal pain, unspecified: Secondary | ICD-10-CM | POA: Insufficient documentation

## 2016-03-19 DIAGNOSIS — I110 Hypertensive heart disease with heart failure: Secondary | ICD-10-CM | POA: Diagnosis not present

## 2016-03-19 DIAGNOSIS — M549 Dorsalgia, unspecified: Secondary | ICD-10-CM | POA: Diagnosis not present

## 2016-03-19 DIAGNOSIS — J45909 Unspecified asthma, uncomplicated: Secondary | ICD-10-CM | POA: Diagnosis not present

## 2016-03-19 DIAGNOSIS — I509 Heart failure, unspecified: Secondary | ICD-10-CM | POA: Insufficient documentation

## 2016-03-19 DIAGNOSIS — Z5321 Procedure and treatment not carried out due to patient leaving prior to being seen by health care provider: Secondary | ICD-10-CM | POA: Diagnosis not present

## 2016-03-19 DIAGNOSIS — Z7984 Long term (current) use of oral hypoglycemic drugs: Secondary | ICD-10-CM | POA: Diagnosis not present

## 2016-03-19 DIAGNOSIS — Z79899 Other long term (current) drug therapy: Secondary | ICD-10-CM | POA: Insufficient documentation

## 2016-03-19 DIAGNOSIS — I2510 Atherosclerotic heart disease of native coronary artery without angina pectoris: Secondary | ICD-10-CM | POA: Insufficient documentation

## 2016-03-19 LAB — COMPREHENSIVE METABOLIC PANEL
ALBUMIN: 3.4 g/dL — AB (ref 3.5–5.0)
ALT: 11 U/L — ABNORMAL LOW (ref 14–54)
ANION GAP: 7 (ref 5–15)
AST: 22 U/L (ref 15–41)
Alkaline Phosphatase: 110 U/L (ref 38–126)
BUN: 27 mg/dL — ABNORMAL HIGH (ref 6–20)
CHLORIDE: 108 mmol/L (ref 101–111)
CO2: 25 mmol/L (ref 22–32)
Calcium: 8.8 mg/dL — ABNORMAL LOW (ref 8.9–10.3)
Creatinine, Ser: 1.27 mg/dL — ABNORMAL HIGH (ref 0.44–1.00)
GFR calc Af Amer: 47 mL/min — ABNORMAL LOW (ref >60)
GFR calc non Af Amer: 40 mL/min — ABNORMAL LOW (ref >60)
GLUCOSE: 244 mg/dL — AB (ref 65–99)
POTASSIUM: 4.6 mmol/L (ref 3.5–5.1)
Sodium: 140 mmol/L (ref 135–145)
Total Bilirubin: 0.6 mg/dL (ref 0.3–1.2)
Total Protein: 7.1 g/dL (ref 6.5–8.1)

## 2016-03-19 LAB — CBC
HEMATOCRIT: 32.7 % — AB (ref 35.0–47.0)
HEMOGLOBIN: 10.9 g/dL — AB (ref 12.0–16.0)
MCH: 27.5 pg (ref 26.0–34.0)
MCHC: 33.5 g/dL (ref 32.0–36.0)
MCV: 82.1 fL (ref 80.0–100.0)
Platelets: 323 10*3/uL (ref 150–440)
RBC: 3.98 MIL/uL (ref 3.80–5.20)
RDW: 14.7 % — ABNORMAL HIGH (ref 11.5–14.5)
WBC: 9.1 10*3/uL (ref 3.6–11.0)

## 2016-03-19 LAB — URINALYSIS, COMPLETE (UACMP) WITH MICROSCOPIC
BACTERIA UA: NONE SEEN
BILIRUBIN URINE: NEGATIVE
Glucose, UA: 150 mg/dL — AB
Hgb urine dipstick: NEGATIVE
KETONES UR: NEGATIVE mg/dL
LEUKOCYTES UA: NEGATIVE
NITRITE: NEGATIVE
Protein, ur: >=300 mg/dL — AB
Specific Gravity, Urine: 1.017 (ref 1.005–1.030)
pH: 5 (ref 5.0–8.0)

## 2016-03-19 LAB — LIPASE, BLOOD: LIPASE: 23 U/L (ref 11–51)
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
## 2016-03-22 ENCOUNTER — Encounter (INDEPENDENT_AMBULATORY_CARE_PROVIDER_SITE_OTHER): Payer: Medicare Other | Admitting: Vascular Surgery

## 2016-03-27 ENCOUNTER — Emergency Department: Payer: Medicare Other

## 2016-03-27 ENCOUNTER — Encounter: Payer: Self-pay | Admitting: Emergency Medicine

## 2016-03-27 ENCOUNTER — Emergency Department
Admission: EM | Admit: 2016-03-27 | Discharge: 2016-03-27 | Disposition: A | Discharge status: Home / Self Care | Payer: Medicare Other | Attending: Emergency Medicine | Admitting: Emergency Medicine

## 2016-03-27 DIAGNOSIS — I110 Hypertensive heart disease with heart failure: Secondary | ICD-10-CM | POA: Diagnosis not present

## 2016-03-27 DIAGNOSIS — J45909 Unspecified asthma, uncomplicated: Secondary | ICD-10-CM | POA: Diagnosis not present

## 2016-03-27 DIAGNOSIS — Z951 Presence of aortocoronary bypass graft: Secondary | ICD-10-CM | POA: Diagnosis not present

## 2016-03-27 DIAGNOSIS — R1013 Epigastric pain: Secondary | ICD-10-CM | POA: Insufficient documentation

## 2016-03-27 DIAGNOSIS — E119 Type 2 diabetes mellitus without complications: Secondary | ICD-10-CM | POA: Insufficient documentation

## 2016-03-27 DIAGNOSIS — R079 Chest pain, unspecified: Secondary | ICD-10-CM | POA: Diagnosis present

## 2016-03-27 DIAGNOSIS — Z7984 Long term (current) use of oral hypoglycemic drugs: Secondary | ICD-10-CM | POA: Diagnosis not present

## 2016-03-27 DIAGNOSIS — I509 Heart failure, unspecified: Secondary | ICD-10-CM | POA: Insufficient documentation

## 2016-03-27 DIAGNOSIS — I259 Chronic ischemic heart disease, unspecified: Secondary | ICD-10-CM | POA: Diagnosis not present

## 2016-03-27 DIAGNOSIS — Z79899 Other long term (current) drug therapy: Secondary | ICD-10-CM | POA: Diagnosis not present

## 2016-03-27 DIAGNOSIS — I2510 Atherosclerotic heart disease of native coronary artery without angina pectoris: Secondary | ICD-10-CM | POA: Diagnosis not present

## 2016-03-27 LAB — CBC
HEMATOCRIT: 30.6 % — AB (ref 35.0–47.0)
Hemoglobin: 10 g/dL — ABNORMAL LOW (ref 12.0–16.0)
MCH: 26.9 pg (ref 26.0–34.0)
MCHC: 32.7 g/dL (ref 32.0–36.0)
MCV: 82.2 fL (ref 80.0–100.0)
PLATELETS: 288 10*3/uL (ref 150–440)
RBC: 3.73 MIL/uL — ABNORMAL LOW (ref 3.80–5.20)
RDW: 14.7 % — AB (ref 11.5–14.5)
WBC: 10.2 10*3/uL (ref 3.6–11.0)

## 2016-03-27 LAB — BASIC METABOLIC PANEL
Anion gap: 9 (ref 5–15)
BUN: 21 mg/dL — AB (ref 6–20)
CO2: 24 mmol/L (ref 22–32)
CREATININE: 1.2 mg/dL — AB (ref 0.44–1.00)
Calcium: 8.6 mg/dL — ABNORMAL LOW (ref 8.9–10.3)
Chloride: 106 mmol/L (ref 101–111)
GFR calc Af Amer: 50 mL/min — ABNORMAL LOW (ref >60)
GFR, EST NON AFRICAN AMERICAN: 43 mL/min — AB (ref >60)
GLUCOSE: 183 mg/dL — AB (ref 65–99)
Potassium: 4.2 mmol/L (ref 3.5–5.1)
SODIUM: 139 mmol/L (ref 135–145)

## 2016-03-27 LAB — TROPONIN I
Troponin I: <0.03 ng/mL (ref <0.03)
Troponin I: <0.03 ng/mL (ref <0.03)

## 2016-03-27 MED ORDER — IOPAMIDOL (ISOVUE-370) INJECTION 76%
100.0000 mL | Freq: Once | INTRAVENOUS | Status: AC | PRN
  Administered 2016-03-27: 100 mL via INTRAVENOUS

## 2016-03-27 MED ORDER — DILTIAZEM HCL 25 MG/5ML IV SOLN
10.0000 mg | Freq: Once | INTRAVENOUS | Status: AC
  Administered 2016-03-27: 10 mg via INTRAVENOUS
  Filled 2016-03-27: qty 5

## 2016-03-27 NOTE — ED Triage Notes (Signed)
Patient ambulatory to triage with steady gait, without difficulty or distress noted; pt reports mid CP x hour with no accomp symptoms; st hx of same pain

## 2016-03-27 NOTE — ED Notes (Signed)
Pt went to CT

## 2016-03-27 NOTE — ED Notes (Signed)
Pt ambulated to the bedside commode and returned to her bed without difficulty.  

## 2016-03-27 NOTE — Discharge Instructions (Signed)
Please return immediately if condition worsens. Please contact her primary physician or the physician you were given for referral. If you have any specialist physicians involved in her treatment and plan please also contact them. Thank you for using [facility]. ° °

## 2016-03-27 NOTE — Consult Note (Signed)
[facility] Cardiology  CARDIOLOGY CONSULT NOTE  Patient ID: Robin [name] MRN: [id number] DOB/AGE: [birth year] 76 y.o.  Admit date: 03/27/2016 Referring Physician Sherryll Burger Primary Physician [facility] Primary Cardiologist Fath Reason for Consultation Chest pain  HPI: 76 year old female for further evaluation of chest pain. The patient has known coronary artery disease, status post CABG 06/01/2013. He has a history of chronic atrial fibrillation, followed by Dr. Lady Gary, on Xarelto for stroke prevention. Patient also has known history of peripheral vascular disease, with history of renal infarct and renal artery stent. The patient has had multiple hospitalizations and ER visits in the past 6 months. Early this morning, the patient noted dictations and heart racing with associated shortness of breath and presented to [facility] emergency room. She ECG did reveal atrial fibrillation with a rapid ventricular rate in the 130 bpm, and after diltiazem bolus. ECG did not reveal any acute ischemic ST-T wave changes. Initial troponin was less than 0.03. Patient currently feels better, denies chest pain.  Review of systems complete and found to be negative unless listed above     Past Medical History:  Diagnosis Date  . Afib (HCC)   . Anxiety   . Asthma   . CHF (congestive heart failure) (HCC)   . Coronary artery disease   . Diabetes mellitus without complication (HCC)   . Hyperlipidemia   . Hypertension     Past Surgical History:  Procedure Laterality Date  . CORONARY ARTERY BYPASS GRAFT    . NO PAST SURGERIES    . PERIPHERAL VASCULAR CATHETERIZATION Left 03/02/2016   Procedure: Renal Intervention;  Surgeon: Renford Dills, MD;  Location: [facility] INVASIVE CV LAB;  Service: Cardiovascular;  Laterality: Left;     (Not in a hospital admission) Social History   Social History  . Marital status: Divorced    Spouse name: N/A  . Number of children: N/A  . Years of education: N/A   Occupational History  . Not  on file.   Social History Main Topics  . Smoking status: Never Smoker  . Smokeless tobacco: Never Used  . Alcohol use No  . Drug use: No  . Sexual activity: No   Other Topics Concern  . Not on file   Social History Narrative  . No narrative on file    Family History  Problem Relation Age of Onset  . Hypertension Mother   . Heart failure Mother   . Heart disease Father       Review of systems complete and found to be negative unless listed above      PHYSICAL EXAM  General: Well developed, well nourished, in no acute distress HEENT:  Normocephalic and atramatic Neck:  No JVD.  Lungs: Clear bilaterally to auscultation and percussion. Heart: HRRR . Normal S1 and S2 without gallops or murmurs.  Abdomen: Bowel sounds are positive, abdomen soft and non-tender  Msk:  Back normal, normal gait. Normal strength and tone for age. Extremities: No clubbing, cyanosis or edema.   Neuro: Alert and oriented X 3. Psych:  Good affect, responds appropriately  Labs:   Lab Results  Component Value Date   WBC 10.2 03/27/2016   HGB 10.0 (L) 03/27/2016   HCT 30.6 (L) 03/27/2016   MCV 82.2 03/27/2016   PLT 288 03/27/2016    Recent Labs Lab 03/27/16 0434  NA 139  K 4.2  CL 106  CO2 24  BUN 21*  CREATININE 1.20*  CALCIUM 8.6*  GLUCOSE 183*   Lab Results  Component Value  Date   CKTOTAL 79 02/11/2014   CKMB 1.3 02/11/2014   TROPONINI <0.03 03/27/2016    Lab Results  Component Value Date   CHOL 229 (H) 02/11/2014   CHOL 254 (H) 09/26/2012   CHOL 211 (H) 07/06/2012   Lab Results  Component Value Date   HDL 49 02/11/2014   HDL 55 09/26/2012   HDL 49 07/06/2012   Lab Results  Component Value Date   LDLCALC 156 (H) 02/11/2014   LDLCALC 166 (H) 09/26/2012   LDLCALC 128 (H) 07/06/2012   Lab Results  Component Value Date   TRIG 120 02/11/2014   TRIG 166 09/26/2012   TRIG 170 07/06/2012   No results found for: CHOLHDL No results found for: LDLDIRECT     Radiology: Ct Angio Chest Pe W And/or Wo Contrast  Result Date: 03/27/2016 CLINICAL DATA:  76 y/o F; chest pain with concern for pulmonary embolus. EXAM: CT ANGIOGRAPHY CHEST, ABDOMEN AND PELVIS TECHNIQUE: Multidetector CT imaging through the chest, abdomen and pelvis was performed using the standard protocol during bolus administration of intravenous contrast. Multiplanar reconstructed images and MIPs were obtained and reviewed to evaluate the vascular anatomy. CONTRAST:  100 cc Isovue 370. COMPARISON:  02/27/2016 CT angiogram of the chest, abdomen and pelvis. FINDINGS: CTA CHEST FINDINGS Cardiovascular: Satisfactory opacification of the pulmonary arteries to the segmental level. No evidence of pulmonary embolism. Severe calcific atherosclerosis of the thoracic aorta, no evidence of dissection or aneurysm. Stable moderate cardiomegaly. Coronary artery calcifications. Status post coronary artery bypass graft surgery. Mediastinum/Nodes: Postsurgical changes related to CABG. Mediastinal lymphadenopathy is stable for example a right peritracheal lymph node measuring 14 mm short axis (series 4, image 25). Normal thoracic esophagus. Right thyroid lobe nodule measuring 11 mm. Lungs/Pleura: Small bilateral pleural effusions are increased in size from the prior study. There is septal thickening at the lung apices consistent with interstitial pulmonary edema. Musculoskeletal: Postsurgical changes related to median sternotomy are stable. No acute osseous abnormality is evident. Review of the MIP images confirms the above findings. 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 Severe calcific atherosclerosis. Stable occlusion of the right superficial femoral artery. Celiac: Calcified plaque of the celiac axis origin with moderate 50% stenosis. SMA: Patent without evidence of  aneurysm, dissection, vasculitis or significant stenosis. Renals: Right renal artery occlusion occlusion. Left renal artery origin stent. IMA: Patent without evidence of aneurysm, dissection, vasculitis or significant stenosis. Veins: No obvious venous abnormality within the limitations of this arterial phase study. Review of the MIP images confirms the above findings. NON-VASCULAR Hepatobiliary: No focal liver abnormality is seen. No gallstones, gallbladder wall thickening, or biliary dilatation. Pancreas: Unremarkable. No pancreatic ductal dilatation or surrounding inflammatory changes. Spleen: Normal in size without focal abnormality. Adrenals/Urinary Tract: Atrophy of the right kidney. Stable large wedge-shaped region of hypoperfusion of the left kidney compatible with infarct involving the lower pole and interpolar region. New small infarct within the superior pole of the left kidney (series 13, image 82). Left kidney upper pole cyst is stable measuring up to 9 mm. Normal adrenal glands. No obstructive uropathy. Normal bladder. Stomach/Bowel: Stomach is within normal limits. Appendix appears normal. No evidence of bowel wall thickening, distention, or inflammatory changes. Extensive sigmoid diverticulosis without evidence for diverticulitis. Lymphatic: No enlarged abdominal or pelvic lymph nodes. Reproductive: Uterus and bilateral adnexa are unremarkable. Other: No abdominal wall hernia or abnormality. No abdominopelvic  ascites. Musculoskeletal: Lumbar degenerative changes are greatest at the L5-S1 level where there is grade 1 anterolisthesis and chronic bilateral L5 pars defects. Review of the MIP images confirms the above findings. IMPRESSION: 1. No evidence of pulmonary embolus. 2. 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 No evidence for new aortic dissection or aneurysm. Severe diffuse aortic atherosclerosis. 3. Small bilateral pleural effusions  are increased in size. Interstitial pulmonary edema. 4. Stable mediastinal adenopathy, possibly due to pulmonary congestion. 5. Stable wedge-shaped hypoperfusion of the left kidney compatible with a renal infarct. 6. New small infarct within the superior pole of the left kidney. 7. Stable atrophy of the right kidney with occlusion of the right renal artery. 8. 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 9. Stable occlusion of the right superficial femoral artery. Electronically Signed   By: Mitzi Hansen M.D.   On: 03/27/2016 06:40   Dg Chest Port 1 View  Result Date: 03/27/2016 CLINICAL DATA:  76 y/o F; chest pain, dyspnea, tachycardia, and epigastric pain. EXAM: PORTABLE CHEST 1 VIEW COMPARISON:  02/17/2016 chest radiograph. FINDINGS: Stable moderate cardiomegaly. Status post CABG. Pulmonary venous hypertension and increased septal markings probably represents interstitial pulmonary edema. No focal consolidation. No pleural effusion. No acute osseous abnormality is identified the IMPRESSION: Pulmonary venous hypertension and probable mild interstitial pulmonary edema. Moderate cardiomegaly. Electronically Signed   By: Mitzi Hansen M.D.   On: 03/27/2016 05:26   Ct Angio Chest Aorta W And/or Wo Contrast  Result Date: 02/28/2016 CLINICAL DATA:  Severe epigastric pain radiating to the back with nausea she has a history of renal artery thrombosis EXAM: CT ANGIOGRAPHY CHEST, ABDOMEN AND PELVIS TECHNIQUE: Multidetector CT imaging through the chest, abdomen and pelvis was performed using the standard protocol during bolus administration of intravenous contrast. Multiplanar reconstructed images and MIPs were obtained and reviewed to evaluate the vascular anatomy. CONTRAST:  100 mL Isovue 370 intravenous COMPARISON:  CT abdomen 02/06/2016 FINDINGS: CTA CHEST FINDINGS Cardiovascular: There is no dissection involving the ascending or descending thoracic  aorta. There is common origin of the left common carotid artery and the right brachiocephalic artery. Left subclavian artery has its takeoff from the distal arch which is left-sided. There is atherosclerosis of the aorta. There are post CABG changes present. Central pulmonary arteries demonstrate no filling defects. There is coronary artery calcification. There is an oval filling defect within the left atrial appendage, series 6, image number 38 which is suspicious for a thrombus. The heart is slightly enlarged. There is no significant pericardial effusion. Mediastinum/Nodes: There are mildly enlarged mediastinal lymph nodes. A node anterior to the aortic arch measures 1.3 cm. A right paratracheal lymph node measures 1.4 cm. No hilar adenopathy. Imaged thyroid gland within normal limits. Trachea is midline. Esophagus grossly unremarkable. Lungs/Pleura: Hazy attenuation is present bilaterally, which may represent diffuse atelectasis or mild edema, there is suggestion of mild septal thickening. There are small bilateral pleural effusions present. There is no acute consolidation. Musculoskeletal: There are degenerative changes. Post sternotomy changes. No acute osseous abnormality. Review of the MIP images confirms the above findings. CTA ABDOMEN AND PELVIS FINDINGS VASCULAR Aorta: Atherosclerosis and mural thrombus is present within the abdominal aorta. No discrete aneurysm is visualized. There is a small linear density within the distal abdominal aorta just above the bifurcation which may represent a small intimal flap, series 6, image number 124. 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 SMA: Calcification and plaque are present at the origin of the SMA with mild, less than 50% stenosis suggested. Renals: The right renal artery is occluded at its origin. Moderate severe stenosis suggested at the origin of the left renal artery. No definitive thrombus is visualized within the renal artery.  IMA: Calcifications and thrombus present at the origin of the IMA, IMA is patent after the origin. Inflow: Moderate atherosclerotic calcification and mural thrombus of the distal aorta. Scattered atherosclerotic calcification within the right external iliac artery. Severe stenosis of the proximal SFA on the right with eventual occlusion of the proximal to mid superficial femoral artery. Mild atherosclerotic calcification of the left external iliac artery. Mild calcification at the left common femoral artery. There is patency of the left superficial femoral artery proximally. Veins: Suboptimally evaluated on arterial study Review of the MIP images confirms the above findings. NON-VASCULAR Hepatobiliary: There is no focal hepatic abnormality visualized. No calcified gallstones. No biliary dilatation. Pancreas: Unremarkable. No pancreatic ductal dilatation or surrounding inflammatory changes. Spleen: Normal in size without focal abnormality. Adrenals/Urinary Tract: The adrenal glands are within normal limits. Again visualized is hypoperfusion of the right kidney consistent with infarction, renal size has decreased compared to the prior study. Now seen is wedge-shaped hypodensity within the left kidney consistent with left renal infarction. The urinary bladder is unremarkable. Stomach/Bowel: Stomach is nonenlarged. There is no dilated small bowel. No colon wall thickening. There is diverticular disease without acute inflammation. Lymphatic: No significantly enlarged lymph nodes. Reproductive: Probable partially calcified uterine fibroid. No adnexal mass. Other: Trace free fluid in the pelvis.  No free air. 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 Review of the MIP images confirms the above findings. IMPRESSION: 1. No evidence for acute dissection involving the ascending or descending thoracic aorta. Suspect short segment focal dissection involving the distal abdominal  aorta just above the bifurcation. 2. Oval filling defect within the left atrial appendage is suspicious for thrombus. Suggest correlation with echocardiography. 3. Hypoperfused right kidney with occluded right renal artery. Right kidney has decreased in size compared to the prior CT scan. New wedge-shaped peripheral hypodensities in the left kidney, consistent with acute left renal infarctions. There is severe stenosis at the origin of the left renal artery. 4. 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 5. 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 Severe stenosis/occlusion of the imaged portions of the right proximal superficial femoral artery. Critical Value/emergent results were called by telephone at the time of interpretation on 02/28/2016 at 12:26 am to Dr. Manson Passey, who verbally acknowledged these results. Electronically Signed   By: Jasmine Pang M.D.   On: 02/28/2016 00:26   Ct Angio Abd/pel W And/or Wo Contrast  Result Date: 03/27/2016 CLINICAL DATA:  76 y/o F; chest pain with concern for pulmonary embolus. EXAM: CT ANGIOGRAPHY CHEST, ABDOMEN AND PELVIS TECHNIQUE: Multidetector CT imaging through the chest, abdomen and pelvis was performed using the standard protocol during bolus administration of intravenous contrast. Multiplanar reconstructed images and MIPs were obtained and reviewed to evaluate the vascular anatomy. CONTRAST:  100 cc Isovue 370. COMPARISON:  02/27/2016 CT angiogram of the chest, abdomen and pelvis. FINDINGS: CTA CHEST FINDINGS Cardiovascular: Satisfactory opacification of the pulmonary arteries to the segmental level. No evidence of pulmonary embolism. Severe calcific atherosclerosis of the thoracic aorta, no evidence of dissection or aneurysm. Stable moderate cardiomegaly. Coronary artery calcifications. Status post coronary artery bypass graft surgery. Mediastinum/Nodes: Postsurgical changes related to  CABG. Mediastinal lymphadenopathy is stable for example a  right peritracheal lymph node measuring 14 mm short axis (series 4, image 25). Normal thoracic esophagus. Right thyroid lobe nodule measuring 11 mm. Lungs/Pleura: Small bilateral pleural effusions are increased in size from the prior study. There is septal thickening at the lung apices consistent with interstitial pulmonary edema. Musculoskeletal: Postsurgical changes related to median sternotomy are stable. No acute osseous abnormality is evident. Review of the MIP images confirms the above findings. 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 Severe calcific atherosclerosis. Stable occlusion of the right superficial femoral artery. Celiac: Calcified plaque of the celiac axis origin with moderate 50% stenosis. SMA: Patent without evidence of aneurysm, dissection, vasculitis or significant stenosis. Renals: Right renal artery occlusion occlusion. Left renal artery origin stent. IMA: Patent without evidence of aneurysm, dissection, vasculitis or significant stenosis. Veins: No obvious venous abnormality within the limitations of this arterial phase study. Review of the MIP images confirms the above findings. NON-VASCULAR Hepatobiliary: No focal liver abnormality is seen. No gallstones, gallbladder wall thickening, or biliary dilatation. Pancreas: Unremarkable. No pancreatic ductal dilatation or surrounding inflammatory changes. Spleen: Normal in size without focal abnormality. Adrenals/Urinary Tract: Atrophy of the right kidney. Stable large wedge-shaped region of hypoperfusion of the left kidney compatible with infarct involving the lower pole and interpolar region. New small infarct within the superior pole of the left kidney (series 13, image 82). Left kidney upper pole cyst is stable measuring up to 9 mm. Normal adrenal glands. No obstructive uropathy. Normal  bladder. Stomach/Bowel: Stomach is within normal limits. Appendix appears normal. No evidence of bowel wall thickening, distention, or inflammatory changes. Extensive sigmoid diverticulosis without evidence for diverticulitis. Lymphatic: No enlarged abdominal or pelvic lymph nodes. Reproductive: Uterus and bilateral adnexa are unremarkable. Other: No abdominal wall hernia or abnormality. No abdominopelvic ascites. Musculoskeletal: Lumbar degenerative changes are greatest at the L5-S1 level where there is grade 1 anterolisthesis and chronic bilateral L5 pars defects. Review of the MIP images confirms the above findings. IMPRESSION: 1. No evidence of pulmonary embolus. 2. 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 No evidence for new aortic dissection or aneurysm. Severe diffuse aortic atherosclerosis. 3. Small bilateral pleural effusions are increased in size. Interstitial pulmonary edema. 4. Stable mediastinal adenopathy, possibly due to pulmonary congestion. 5. Stable wedge-shaped hypoperfusion of the left kidney compatible with a renal infarct. 6. New small infarct within the superior pole of the left kidney. 7. Stable atrophy of the right kidney with occlusion of the right renal artery. 8. 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 9. Stable occlusion of the right superficial femoral artery. Electronically Signed   By: Mitzi Hansen M.D.   On: 03/27/2016 06:40   Ct Angio Abd/pel W And/or Wo Contrast  Result Date: 02/28/2016 CLINICAL DATA:  Severe epigastric pain radiating to the back with nausea she has a history of renal artery thrombosis EXAM: CT ANGIOGRAPHY CHEST, ABDOMEN AND PELVIS TECHNIQUE: Multidetector CT imaging through the chest, abdomen and pelvis was performed using the standard protocol during bolus administration of intravenous contrast. Multiplanar reconstructed images and MIPs were  obtained and reviewed to evaluate the vascular anatomy. CONTRAST:  100 mL Isovue 370 intravenous COMPARISON:  CT abdomen 02/06/2016 FINDINGS: CTA CHEST FINDINGS Cardiovascular: There is no dissection involving the ascending or descending thoracic aorta. There  is common origin of the left common carotid artery and the right brachiocephalic artery. Left subclavian artery has its takeoff from the distal arch which is left-sided. There is atherosclerosis of the aorta. There are post CABG changes present. Central pulmonary arteries demonstrate no filling defects. There is coronary artery calcification. There is an oval filling defect within the left atrial appendage, series 6, image number 38 which is suspicious for a thrombus. The heart is slightly enlarged. There is no significant pericardial effusion. Mediastinum/Nodes: There are mildly enlarged mediastinal lymph nodes. A node anterior to the aortic arch measures 1.3 cm. A right paratracheal lymph node measures 1.4 cm. No hilar adenopathy. Imaged thyroid gland within normal limits. Trachea is midline. Esophagus grossly unremarkable. Lungs/Pleura: Hazy attenuation is present bilaterally, which may represent diffuse atelectasis or mild edema, there is suggestion of mild septal thickening. There are small bilateral pleural effusions present. There is no acute consolidation. Musculoskeletal: There are degenerative changes. Post sternotomy changes. No acute osseous abnormality. Review of the MIP images confirms the above findings. CTA ABDOMEN AND PELVIS FINDINGS VASCULAR Aorta: Atherosclerosis and mural thrombus is present within the abdominal aorta. No discrete aneurysm is visualized. There is a small linear density within the distal abdominal aorta just above the bifurcation which may represent a small intimal flap, series 6, image number 124. 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 SMA: Calcification and plaque are present at the origin of  the SMA with mild, less than 50% stenosis suggested. Renals: The right renal artery is occluded at its origin. Moderate severe stenosis suggested at the origin of the left renal artery. No definitive thrombus is visualized within the renal artery. IMA: Calcifications and thrombus present at the origin of the IMA, IMA is patent after the origin. Inflow: Moderate atherosclerotic calcification and mural thrombus of the distal aorta. Scattered atherosclerotic calcification within the right external iliac artery. Severe stenosis of the proximal SFA on the right with eventual occlusion of the proximal to mid superficial femoral artery. Mild atherosclerotic calcification of the left external iliac artery. Mild calcification at the left common femoral artery. There is patency of the left superficial femoral artery proximally. Veins: Suboptimally evaluated on arterial study Review of the MIP images confirms the above findings. NON-VASCULAR Hepatobiliary: There is no focal hepatic abnormality visualized. No calcified gallstones. No biliary dilatation. Pancreas: Unremarkable. No pancreatic ductal dilatation or surrounding inflammatory changes. Spleen: Normal in size without focal abnormality. Adrenals/Urinary Tract: The adrenal glands are within normal limits. Again visualized is hypoperfusion of the right kidney consistent with infarction, renal size has decreased compared to the prior study. Now seen is wedge-shaped hypodensity within the left kidney consistent with left renal infarction. The urinary bladder is unremarkable. Stomach/Bowel: Stomach is nonenlarged. There is no dilated small bowel. No colon wall thickening. There is diverticular disease without acute inflammation. Lymphatic: No significantly enlarged lymph nodes. Reproductive: Probable partially calcified uterine fibroid. No adnexal mass. Other: Trace free fluid in the pelvis.  No free air. 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 Review of the MIP images confirms the above findings. IMPRESSION: 1. No evidence for acute dissection involving the ascending or descending thoracic aorta. Suspect short segment focal dissection involving the distal abdominal aorta just above the bifurcation. 2. Oval filling defect within the left atrial appendage is suspicious for thrombus. Suggest correlation with echocardiography. 3. Hypoperfused right kidney with occluded right renal artery. Right kidney has decreased in size  compared to the prior CT scan. New wedge-shaped peripheral hypodensities in the left kidney, consistent with acute left renal infarctions. There is severe stenosis at the origin of the left renal artery. 4. 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 5. 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 Severe stenosis/occlusion of the imaged portions of the right proximal superficial femoral artery. Critical Value/emergent results were called by telephone at the time of interpretation on 02/28/2016 at 12:26 am to Dr. Manson Passey, who verbally acknowledged these results. Electronically Signed   By: Jasmine Pang M.D.   On: 02/28/2016 00:26    EKG: Atrial fibrillation  ASSESSMENT AND PLAN:   1. Chest pain, with atypical features, likely due to tachycardia, improved after the ties and bolus, no ischemic ECG changes, negative troponin 2. Chronic atrial fibrillation, with rapid ventricular rate, improved after diltiazem bolus  Recommendations  1. Agree with overall current therapy 2. Continue Xarelto for stroke prevention 3. Increase metoprolol succinate to 50 mg daily for better rate control 4. Trend troponins. If second troponin remains negative, may discharge patient home for further outpatient workup with Dr. Lady Gary   Signed: Marcina Millard MD,PhD, [facility] 03/27/2016, 8:07 AM

## 2016-03-27 NOTE — ED Provider Notes (Signed)
-----------------------------------------   9:39 AM on 03/27/2016 -----------------------------------------   Blood pressure (!) 173/82, pulse (!) 55, temperature 98 F (36.7 C), temperature source Oral, resp. rate 19, height 5\' 4"  (1.626 m), weight 155 lb (70.3 kg), SpO2 95 %.  Assuming care from Dr. Manson Passey.  In short, Robin [name] is a 76 y.o. female with a chief complaint of Chest Pain .  Refer to the original H&P for additional details.  The current plan of care is to *follow discharge instructions given to her by the cardiologist which she states she is of understanding on how and when to take her medications. The patient's repeat troponin was negative and we felt there was no reason to hospitalize patient at this time she was advised to contact the cardiologist that she has any questions or concerns. She is currently asymptomatic and resting comfortably with normal vital signs..   Clinical Course       Jennye Moccasin, MD 03/27/16 [phone]

## 2016-03-27 NOTE — ED Provider Notes (Signed)
[facility] Emergency Department Provider Note   First MD Initiated Contact with Patient 03/27/16 [phone]     (approximate)  I have reviewed the triage vital signs and the nursing notes.   HISTORY  Chief Complaint Chest Pain    HPI Robin [name] is a 76 y.o. female presents with central chest pain 1 hour with no associated symptoms. Patient denies any dyspnea nausea vomiting or diarrhea. Patient does admit to epigastric abdominal discomfort as well. Patient states her current pain score is 6 out of 10.   Past Medical History:  Diagnosis Date  . Afib (HCC)   . Anxiety   . Asthma   . CHF (congestive heart failure) (HCC)   . Coronary artery disease   . Diabetes mellitus without complication (HCC)   . Hyperlipidemia   . Hypertension     Patient Active Problem List   Diagnosis Date Noted  . 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 Renal artery stenosis (HCC) 02/28/2016  . Accelerated hypertension 02/28/2016  . Mild dementia 02/28/2016  . Noncompliance 02/07/2016  . Renal infarct (HCC) 02/06/2016  . Atrial fibrillation with rapid ventricular response (HCC) 02/06/2016  . A-fib (HCC) 01/24/2016  . HTN (hypertension) 11/04/2015  . HLD (hyperlipidemia) 11/04/2015  . CAD (coronary artery disease) 11/04/2015  . Chronic systolic CHF (congestive heart failure) (HCC) 11/04/2015  . Diabetes (HCC) 11/04/2015    Past Surgical History:  Procedure Laterality Date  . CORONARY ARTERY BYPASS GRAFT    . NO PAST SURGERIES    . PERIPHERAL VASCULAR CATHETERIZATION Left 03/02/2016   Procedure: Renal Intervention;  Surgeon: Renford Dills, MD;  Location: [facility] INVASIVE CV LAB;  Service: Cardiovascular;  Laterality: Left;    Prior to Admission medications   Medication Sig Start Date End Date Taking? Authorizing Provider  albuterol (ACCUNEB) 1.25 MG/3ML nebulizer solution Take 1.25 mg by nebulization every 4 (four) hours as needed for wheezing or shortness of  breath.     Historical Provider, MD  atorvastatin (LIPITOR) 80 MG tablet Take 1 tablet (80 mg total) by mouth daily at 6 PM. 03/05/16   Enedina Finner, MD  diltiazem (CARDIZEM CD) 180 MG 24 hr capsule Take 1 capsule (180 mg total) by mouth daily. 03/05/16   Enedina Finner, MD  glipiZIDE (GLUCOTROL) 5 MG tablet Take 0.5 tablets (2.5 mg total) by mouth daily before breakfast. 03/06/16   Enedina Finner, MD  meclizine (ANTIVERT) 25 MG tablet Take 1 tablet (25 mg total) by mouth 3 (three) times daily as needed for dizziness. 11/06/15   Enid Baas, MD  metoprolol (LOPRESSOR) 50 MG tablet Take 1 tablet (50 mg total) by mouth 3 (three) times daily. 02/10/16   Altamese Dilling, MD  mometasone-formoterol (DULERA) 200-5 MCG/ACT AERO Inhale 2 puffs into the lungs 2 (two) times daily. 03/05/16   Enedina Finner, MD  nitroGLYCERIN (NITROSTAT) 0.4 MG SL tablet Place 1 tablet (0.4 mg total) under the tongue every 5 (five) minutes as needed for chest pain. 01/25/16   Adrian Saran, MD  ondansetron (ZOFRAN ODT) 4 MG disintegrating tablet Take 1 tablet (4 mg total) by mouth every 8 (eight) hours as needed for nausea or vomiting. 11/06/15   Enid Baas, MD  oxyCODONE-acetaminophen (PERCOCET/ROXICET) 5-325 MG tablet Take 1 tablet by mouth every 4 (four) hours as needed for moderate pain or severe pain. 03/05/16   Enedina Finner, MD  pantoprazole (PROTONIX) 40 MG tablet Take 1 tablet (40 mg total) by mouth daily. Patient taking differently: Take 40  mg by mouth daily as needed. For acid reflux 11/06/15   Enid Baas, MD  rivaroxaban (XARELTO) 20 MG TABS tablet Take 1 tablet (20 mg total) by mouth daily with supper. Patient not taking: Reported on 02/28/2016 03/03/16   Altamese Dilling, MD  traMADol (ULTRAM) 50 MG tablet Take 1 tablet (50 mg total) by mouth every 12 (twelve) hours as needed for moderate pain or severe pain. 02/10/16   Altamese Dilling, MD    Allergies Morphine and related  Family History  Problem  Relation Age of Onset  . Hypertension Mother   . Heart failure Mother   . Heart disease Father     Social History Social History  Substance Use Topics  . Smoking status: Never Smoker  . Smokeless tobacco: Never Used  . Alcohol use No    Review of Systems Constitutional: No fever/chills Eyes: No visual changes. ENT: No sore throat. Cardiovascular: Positive for chest pain. Respiratory: Denies shortness of breath. Gastrointestinal: No abdominal pain.  No nausea, no vomiting.  No diarrhea.  No constipation. Genitourinary: Negative for dysuria. Musculoskeletal: Negative for back pain. Skin: Negative for rash. Neurological: Negative for headaches, focal weakness or numbness.  10-point ROS otherwise negative.  ____________________________________________   PHYSICAL EXAM:  VITAL SIGNS: ED Triage Vitals  Enc Vitals Group     BP 03/27/16 0417 (!) 190/96     Pulse Rate 03/27/16 0417 (!) 122     Resp 03/27/16 0625 18     Temp 03/27/16 0417 97.7 F (36.5 C)     Temp Source 03/27/16 0417 Oral     SpO2 03/27/16 0417 97 %     Weight 03/27/16 0416 155 lb (70.3 kg)     Height 03/27/16 0416 5\' 4"  (1.626 m)     Head Circumference --      Peak Flow --      Pain Score 03/27/16 0417 10     Pain Loc --      Pain Edu? --      Excl. in GC? --     Constitutional: Alert and oriented. Well appearing and in no acute distress. Eyes: Conjunctivae are normal. PERRL. EOMI. Head: Atraumatic. Mouth/Throat: Mucous membranes are moist.  Oropharynx non-erythematous. Neck: No stridor.  Cardiovascular: Normal rate, regular rhythm. Good peripheral circulation. Grossly normal heart sounds. Respiratory: Normal respiratory effort.  No retractions. Lungs CTAB. Gastrointestinal: Soft and nontender. No distention.  Musculoskeletal: No lower extremity tenderness nor edema. No gross deformities of extremities. Neurologic:  Normal speech and language. No gross focal neurologic deficits are appreciated.    Skin:  Skin is warm, dry and intact. No rash noted. Psychiatric: Mood and affect are normal. Speech and behavior are normal.  ____________________________________________   LABS (all labs ordered are listed, but only abnormal results are displayed)  Labs Reviewed  BASIC METABOLIC PANEL - Abnormal; Notable for the following:       Result Value   Glucose, Bld 183 (*)    BUN 21 (*)    Creatinine, Ser 1.20 (*)    Calcium 8.6 (*)    GFR calc non Af Amer 43 (*)    GFR calc Af Amer 50 (*)    All other components within normal limits  CBC - Abnormal; Notable for the following:    RBC 3.73 (*)    Hemoglobin 10.0 (*)    HCT 30.6 (*)    RDW 14.7 (*)    All other components within normal limits  TROPONIN I   ____________________________________________  EKG  ED ECG REPORT I, [place], the attending physician, personally viewed and interpreted this ECG.   Date: 03/27/2016  EKG Time: 4:22 AM  Rate: 124  Rhythm: Atrial fibrillation with rapid ventricular response  Axis: Normal  Intervals: Normal  ST&T Change: None  ____________________________________________  RADIOLOGY I, [place] Alany [place], personally viewed and evaluated these images (plain radiographs) as part of my medical decision making, as well as reviewing the written report by the radiologist. Ct Angio Chest Pe W And/or Wo Contrast  Result Date: 03/27/2016 CLINICAL DATA:  76 y/o F; chest pain with concern for pulmonary embolus. EXAM: CT ANGIOGRAPHY CHEST, ABDOMEN AND PELVIS TECHNIQUE: Multidetector CT imaging through the chest, abdomen and pelvis was performed using the standard protocol during bolus administration of intravenous contrast. Multiplanar reconstructed images and MIPs were obtained and reviewed to evaluate the vascular anatomy. CONTRAST:  100 cc Isovue 370. COMPARISON:  02/27/2016 CT angiogram of the chest, abdomen and pelvis. FINDINGS: CTA CHEST FINDINGS Cardiovascular: Satisfactory opacification of the  pulmonary arteries to the segmental level. No evidence of pulmonary embolism. Severe calcific atherosclerosis of the thoracic aorta, no evidence of dissection or aneurysm. Stable moderate cardiomegaly. Coronary artery calcifications. Status post coronary artery bypass graft surgery. Mediastinum/Nodes: Postsurgical changes related to CABG. Mediastinal lymphadenopathy is stable for example a right peritracheal lymph node measuring 14 mm short axis (series 4, image 25). Normal thoracic esophagus. Right thyroid lobe nodule measuring 11 mm. Lungs/Pleura: Small bilateral pleural effusions are increased in size from the prior study. There is septal thickening at the lung apices consistent with interstitial pulmonary edema. Musculoskeletal: Postsurgical changes related to median sternotomy are stable. No acute osseous abnormality is evident. Review of the MIP images confirms the above findings. 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 Severe calcific atherosclerosis. Stable occlusion of the right superficial femoral artery. Celiac: Calcified plaque of the celiac axis origin with moderate 50% stenosis. SMA: Patent without evidence of aneurysm, dissection, vasculitis or significant stenosis. Renals: Right renal artery occlusion occlusion. Left renal artery origin stent. IMA: Patent without evidence of aneurysm, dissection, vasculitis or significant stenosis. Veins: No obvious venous abnormality within the limitations of this arterial phase study. Review of the MIP images confirms the above findings. NON-VASCULAR Hepatobiliary: No focal liver abnormality is seen. No gallstones, gallbladder wall thickening, or biliary dilatation. Pancreas: Unremarkable. No pancreatic ductal dilatation or surrounding inflammatory changes. Spleen: Normal in size without focal abnormality.  Adrenals/Urinary Tract: Atrophy of the right kidney. Stable large wedge-shaped region of hypoperfusion of the left kidney compatible with infarct involving the lower pole and interpolar region. New small infarct within the superior pole of the left kidney (series 13, image 82). Left kidney upper pole cyst is stable measuring up to 9 mm. Normal adrenal glands. No obstructive uropathy. Normal bladder. Stomach/Bowel: Stomach is within normal limits. Appendix appears normal. No evidence of bowel wall thickening, distention, or inflammatory changes. Extensive sigmoid diverticulosis without evidence for diverticulitis. Lymphatic: No enlarged abdominal or pelvic lymph nodes. Reproductive: Uterus and bilateral adnexa are unremarkable. Other: No abdominal wall hernia or abnormality. No abdominopelvic ascites. Musculoskeletal: Lumbar degenerative changes are greatest at the L5-S1 level where there is grade 1 anterolisthesis and chronic bilateral L5 pars defects. Review of the MIP images confirms the above findings. IMPRESSION: 1. No evidence of pulmonary embolus. 2. 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 No evidence for new aortic dissection or aneurysm. Severe diffuse aortic atherosclerosis. 3. Small bilateral pleural effusions are increased in size. Interstitial pulmonary edema. 4. Stable mediastinal adenopathy, possibly due to pulmonary congestion. 5. Stable wedge-shaped hypoperfusion of the left kidney compatible with a renal infarct. 6. New small infarct within the superior pole of the left kidney. 7. Stable atrophy of the right kidney with occlusion of the right renal artery. 8. 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 9. Stable occlusion of the right superficial femoral artery. Electronically Signed   By: Mitzi Hansen M.D.   On: 03/27/2016 06:40   Dg Chest Port 1 View  Result Date:  03/27/2016 CLINICAL DATA:  76 y/o F; chest pain, dyspnea, tachycardia, and epigastric pain. EXAM: PORTABLE CHEST 1 VIEW COMPARISON:  02/17/2016 chest radiograph. FINDINGS: Stable moderate cardiomegaly. Status post CABG. Pulmonary venous hypertension and increased septal markings probably represents interstitial pulmonary edema. No focal consolidation. No pleural effusion. No acute osseous abnormality is identified the IMPRESSION: Pulmonary venous hypertension and probable mild interstitial pulmonary edema. Moderate cardiomegaly. Electronically Signed   By: Mitzi Hansen M.D.   On: 03/27/2016 05:26   Ct Angio Abd/pel W And/or Wo Contrast  Result Date: 03/27/2016 CLINICAL DATA:  76 y/o F; chest pain with concern for pulmonary embolus. EXAM: CT ANGIOGRAPHY CHEST, ABDOMEN AND PELVIS TECHNIQUE: Multidetector CT imaging through the chest, abdomen and pelvis was performed using the standard protocol during bolus administration of intravenous contrast. Multiplanar reconstructed images and MIPs were obtained and reviewed to evaluate the vascular anatomy. CONTRAST:  100 cc Isovue 370. COMPARISON:  02/27/2016 CT angiogram of the chest, abdomen and pelvis. FINDINGS: CTA CHEST FINDINGS Cardiovascular: Satisfactory opacification of the pulmonary arteries to the segmental level. No evidence of pulmonary embolism. Severe calcific atherosclerosis of the thoracic aorta, no evidence of dissection or aneurysm. Stable moderate cardiomegaly. Coronary artery calcifications. Status post coronary artery bypass graft surgery. Mediastinum/Nodes: Postsurgical changes related to CABG. Mediastinal lymphadenopathy is stable for example a right peritracheal lymph node measuring 14 mm short axis (series 4, image 25). Normal thoracic esophagus. Right thyroid lobe nodule measuring 11 mm. Lungs/Pleura: Small bilateral pleural effusions are increased in size from the prior study. There is septal thickening at the lung apices consistent  with interstitial pulmonary edema. Musculoskeletal: Postsurgical changes related to median sternotomy are stable. No acute osseous abnormality is evident. Review of the MIP images confirms the above findings. 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 Severe calcific atherosclerosis. Stable occlusion of the right superficial femoral artery. Celiac: Calcified plaque of the celiac axis origin with moderate 50% stenosis. SMA: Patent without evidence of aneurysm, dissection, vasculitis or significant stenosis. Renals: Right renal artery occlusion occlusion. Left renal artery origin stent. IMA: Patent without evidence of aneurysm, dissection, vasculitis or significant stenosis. Veins: No obvious venous abnormality within the limitations of this arterial phase study. Review of the MIP images confirms the above findings. NON-VASCULAR Hepatobiliary: No focal liver abnormality is seen. No gallstones, gallbladder wall thickening, or biliary dilatation. Pancreas: Unremarkable. No pancreatic ductal dilatation or surrounding inflammatory changes. Spleen: Normal in size without focal abnormality. Adrenals/Urinary Tract: Atrophy of the right kidney. Stable large wedge-shaped region of hypoperfusion of the left kidney compatible with infarct involving the lower pole and interpolar region. New small infarct within the superior pole of the left kidney (series  13, image 82). Left kidney upper pole cyst is stable measuring up to 9 mm. Normal adrenal glands. No obstructive uropathy. Normal bladder. Stomach/Bowel: Stomach is within normal limits. Appendix appears normal. No evidence of bowel wall thickening, distention, or inflammatory changes. Extensive sigmoid diverticulosis without evidence for diverticulitis. Lymphatic: No enlarged abdominal or pelvic lymph nodes. Reproductive: Uterus  and bilateral adnexa are unremarkable. Other: No abdominal wall hernia or abnormality. No abdominopelvic ascites. Musculoskeletal: Lumbar degenerative changes are greatest at the L5-S1 level where there is grade 1 anterolisthesis and chronic bilateral L5 pars defects. Review of the MIP images confirms the above findings. IMPRESSION: 1. No evidence of pulmonary embolus. 2. 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 No evidence for new aortic dissection or aneurysm. Severe diffuse aortic atherosclerosis. 3. Small bilateral pleural effusions are increased in size. Interstitial pulmonary edema. 4. Stable mediastinal adenopathy, possibly due to pulmonary congestion. 5. Stable wedge-shaped hypoperfusion of the left kidney compatible with a renal infarct. 6. New small infarct within the superior pole of the left kidney. 7. Stable atrophy of the right kidney with occlusion of the right renal artery. 8. 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 9. Stable occlusion of the right superficial femoral artery. Electronically Signed   By: Mitzi Hansen M.D.   On: 03/27/2016 06:40      Procedures   Critical Care performed: _____________   INITIAL IMPRESSION / ASSESSMENT AND PLAN / ED COURSE  Pertinent labs & imaging results that were available during my care of the patient were reviewed by me and considered in my medical decision making (see chart for details).  76 year old female presenting to the emergency department with chest and abdominal pain. Patient recently had a renal infarct requiring a stent currently taking Xarelto. In  addition previous CT scan performed revealed possible left atrial thrombus and a such CT angiogram chest abdomen pelvis performed today which revealed no pulmonary emboli. New small left kidney superior pole infarct. Patient discussed with Dr. Sherryll Burger who spoke with Dr. Ann Maki  shows cardiologist will evaluate the patient in the emergency department. Patient's care transferred to Dr. Huel Cote   Clinical Course     ____________________________________________  FINAL CLINICAL IMPRESSION(S) / ED DIAGNOSES  Final diagnoses:  Chest pain due to myocardial ischemia, unspecified ischemic chest pain type (HCC)     MEDICATIONS GIVEN DURING THIS VISIT:  Medications  iopamidol (ISOVUE-370) 76 % injection 100 mL (100 mLs Intravenous Contrast Given 03/27/16 0541)  diltiazem (CARDIZEM) injection 10 mg (10 mg Intravenous Given 03/27/16 0629)     NEW OUTPATIENT MEDICATIONS STARTED DURING THIS VISIT:  New Prescriptions   No medications on file    Modified Medications   No medications on file    Discontinued Medications   No medications on file     Note:  This document was prepared using Dragon voice recognition software and may include unintentional dictation errors.    Darci Current, MD 03/28/16 [phone]

## 2016-03-27 NOTE — Progress Notes (Signed)
The Nurse asked [facility] to visit with Pt in ED Rm02. CH had seen the Pt before when she was in ICU. CH met with Pt. Pt expressed that she was hungry, but she can not eat crackers and peanut because she is diabetic.  [place] talked to Nurse, but Nurse had no way of providing food. CH provided support and prayer.    03/27/16 1600  Clinical Encounter Type  Visited With Patient  Visit Type Initial;Spiritual support;Code;Trauma  Referral From Nurse  Consult/Referral To Chaplain  Spiritual Encounters  Spiritual Needs Prayer;Other (Comment)

## 2016-03-27 NOTE — Progress Notes (Signed)
I've discussed case with Dr Manson Passey. Agreed on getting on 1 more set of troponin. I've also d/w Cardio Dr Darrold Junker for evaluation/consult in ED for potential D/C home for there.  Please note, This patient has had multiple admissions and ED visits. Doubt this chest pain is cardiac. She is vasculopath per EDP and certainly has some new kidney infarct but is already on xarelto and not sure if we r able to add anything to her care while in the Hospital.  EDP in agreement.

## 2016-04-18 ENCOUNTER — Emergency Department: Payer: Medicare Other

## 2016-04-18 ENCOUNTER — Emergency Department
Admission: EM | Admit: 2016-04-18 | Discharge: 2016-04-18 | Disposition: A | Discharge status: Home / Self Care | Payer: Medicare Other | Attending: Student in an Organized Health Care Education/Training Program | Admitting: Student in an Organized Health Care Education/Training Program

## 2016-04-18 DIAGNOSIS — I110 Hypertensive heart disease with heart failure: Secondary | ICD-10-CM | POA: Insufficient documentation

## 2016-04-18 DIAGNOSIS — I2510 Atherosclerotic heart disease of native coronary artery without angina pectoris: Secondary | ICD-10-CM | POA: Insufficient documentation

## 2016-04-18 DIAGNOSIS — J45909 Unspecified asthma, uncomplicated: Secondary | ICD-10-CM | POA: Diagnosis not present

## 2016-04-18 DIAGNOSIS — I480 Paroxysmal atrial fibrillation: Secondary | ICD-10-CM | POA: Insufficient documentation

## 2016-04-18 DIAGNOSIS — Z79899 Other long term (current) drug therapy: Secondary | ICD-10-CM | POA: Insufficient documentation

## 2016-04-18 DIAGNOSIS — E119 Type 2 diabetes mellitus without complications: Secondary | ICD-10-CM | POA: Diagnosis not present

## 2016-04-18 DIAGNOSIS — I4891 Unspecified atrial fibrillation: Secondary | ICD-10-CM

## 2016-04-18 DIAGNOSIS — I509 Heart failure, unspecified: Secondary | ICD-10-CM | POA: Diagnosis not present

## 2016-04-18 DIAGNOSIS — Z7984 Long term (current) use of oral hypoglycemic drugs: Secondary | ICD-10-CM | POA: Diagnosis not present

## 2016-04-18 DIAGNOSIS — R079 Chest pain, unspecified: Secondary | ICD-10-CM | POA: Diagnosis present

## 2016-04-18 LAB — CBC WITH DIFFERENTIAL/PLATELET
Basophils Absolute: 0.1 K/uL (ref 0–0.1)
Basophils Relative: 1 %
Eosinophils Absolute: 0 K/uL (ref 0–0.7)
Eosinophils Relative: 1 %
HCT: 32.1 % — ABNORMAL LOW (ref 35.0–47.0)
Hemoglobin: 10.4 g/dL — ABNORMAL LOW (ref 12.0–16.0)
Lymphocytes Relative: 22 %
Lymphs Abs: 1.7 K/uL (ref 1.0–3.6)
MCH: 26.4 pg (ref 26.0–34.0)
MCHC: 32.5 g/dL (ref 32.0–36.0)
MCV: 81.2 fL (ref 80.0–100.0)
Monocytes Absolute: 0.6 K/uL (ref 0.2–0.9)
Monocytes Relative: 7 %
Neutro Abs: 5.3 K/uL (ref 1.4–6.5)
Neutrophils Relative %: 69 %
Platelets: 305 K/uL (ref 150–440)
RBC: 3.95 MIL/uL (ref 3.80–5.20)
RDW: 15.1 % — ABNORMAL HIGH (ref 11.5–14.5)
WBC: 7.7 K/uL (ref 3.6–11.0)

## 2016-04-18 LAB — COMPREHENSIVE METABOLIC PANEL WITH GFR
ALT: 12 U/L — ABNORMAL LOW (ref 14–54)
AST: 15 U/L (ref 15–41)
Albumin: 3.6 g/dL (ref 3.5–5.0)
Alkaline Phosphatase: 94 U/L (ref 38–126)
Anion gap: 10 (ref 5–15)
BUN: 31 mg/dL — ABNORMAL HIGH (ref 6–20)
CO2: 23 mmol/L (ref 22–32)
Calcium: 8.8 mg/dL — ABNORMAL LOW (ref 8.9–10.3)
Chloride: 110 mmol/L (ref 101–111)
Creatinine, Ser: 1.32 mg/dL — ABNORMAL HIGH (ref 0.44–1.00)
GFR calc Af Amer: 45 mL/min — ABNORMAL LOW
GFR calc non Af Amer: 38 mL/min — ABNORMAL LOW
Glucose, Bld: 184 mg/dL — ABNORMAL HIGH (ref 65–99)
Potassium: 4.3 mmol/L (ref 3.5–5.1)
Sodium: 143 mmol/L (ref 135–145)
Total Bilirubin: 0.3 mg/dL (ref 0.3–1.2)
Total Protein: 7 g/dL (ref 6.5–8.1)

## 2016-04-18 LAB — PROTIME-INR
INR: 1.04
Prothrombin Time: 13.6 s (ref 11.4–15.2)

## 2016-04-18 LAB — APTT: aPTT: 26 seconds (ref 24–36)

## 2016-04-18 LAB — TROPONIN I: Troponin I: <0.03 ng/mL

## 2016-04-18 MED ORDER — DILTIAZEM HCL 25 MG/5ML IV SOLN: 15.0000 mg | Freq: Once | INTRAVENOUS | Status: DC

## 2016-04-18 MED ORDER — DILTIAZEM HCL 25 MG/5ML IV SOLN: 10.0000 mg | Freq: Once | INTRAVENOUS | Status: DC

## 2016-04-18 MED ORDER — METOPROLOL TARTRATE 50 MG PO TABS
50.0000 mg | ORAL_TABLET | Freq: Once | ORAL | Status: AC
  Administered 2016-04-18: 50 mg via ORAL
  Filled 2016-04-18: qty 1

## 2016-04-18 MED ORDER — METOPROLOL TARTRATE 5 MG/5ML IV SOLN
5.0000 mg | Freq: Once | INTRAVENOUS | Status: AC
  Administered 2016-04-18: 5 mg via INTRAVENOUS
  Filled 2016-04-18: qty 5

## 2016-04-18 MED ORDER — METOPROLOL SUCCINATE ER 50 MG PO TB24
100.0000 mg | ORAL_TABLET | Freq: Every day | ORAL | Status: DC
  Administered 2016-04-18: 100 mg via ORAL
  Filled 2016-04-18: qty 2

## 2016-04-18 MED ORDER — DILTIAZEM HCL ER COATED BEADS 180 MG PO CP24: 180.0000 mg | ORAL_CAPSULE | Freq: Once | ORAL | Status: DC

## 2016-04-18 NOTE — Discharge Instructions (Signed)
You should be taking the following medications for your a-fib. Metoprolol tartrate 50mg  every 8 hrs to keep your heart rate under 110.    Return for any worsening weakness, shortness of breath or chest pain.  Call Dr. America Brown clinic to make an appointment for close follow up.

## 2016-04-18 NOTE — ED Triage Notes (Signed)
She arrives from home with reports of centralized chest pain that began this am  "My Afib is racing"  Pt alert and oriented upon arrival  Recent d/c from [facility]

## 2016-04-18 NOTE — ED Provider Notes (Signed)
[facility] Emergency Department Provider Note    First MD Initiated Contact with Patient 04/18/16 [phone]     (approximate)  I have reviewed the triage vital signs and the nursing notes.   HISTORY  Chief Complaint Chest Pain    HPI Anyia [name] is a 76 y.o. female with a history of congestive heart failure, anxiety and asthma with history of A. fib presents to the ER 2 days after discharge from [facility] with palpitations and chest discomfort. Patient states that she was told to stop all medications. States she has not taken anything for her blood pressure or heart rate in the last 2 days. Denies any fevers. No worsening lower extremity swelling. On review of medical records from [facility] patient was on metoprolol as well as Cardizem but had Cardizem discontinued due to symptomatic bradycardia. She is discharged with prescription for 100 mg of Toprol succinate as well as 50 mg every 8 tartrate. She has not been taking this medication the past 2 days.   Past Medical History:  Diagnosis Date  . Afib (HCC)   . Anxiety   . Asthma   . CHF (congestive heart failure) (HCC)   . Coronary artery disease   . Diabetes mellitus without complication (HCC)   . Hyperlipidemia   . Hypertension    Family History  Problem Relation Age of Onset  . Hypertension Mother   . Heart failure Mother   . Heart disease Father    Past Surgical History:  Procedure Laterality Date  . CORONARY ARTERY BYPASS GRAFT    . NO PAST SURGERIES    . PERIPHERAL VASCULAR CATHETERIZATION Left 03/02/2016   Procedure: Renal Intervention;  Surgeon: Renford Dills, MD;  Location: [facility] INVASIVE CV LAB;  Service: Cardiovascular;  Laterality: Left;   Patient Active Problem List   Diagnosis Date Noted  . 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 Renal artery stenosis (HCC) 02/28/2016  . Accelerated hypertension 02/28/2016  . Mild dementia 02/28/2016  . Noncompliance 02/07/2016  .  Renal infarct (HCC) 02/06/2016  . Atrial fibrillation with rapid ventricular response (HCC) 02/06/2016  . A-fib (HCC) 01/24/2016  . HTN (hypertension) 11/04/2015  . HLD (hyperlipidemia) 11/04/2015  . CAD (coronary artery disease) 11/04/2015  . Chronic systolic CHF (congestive heart failure) (HCC) 11/04/2015  . Diabetes (HCC) 11/04/2015      Prior to Admission medications   Medication Sig Start Date End Date Taking? Authorizing Provider  albuterol (ACCUNEB) 1.25 MG/3ML nebulizer solution Take 1.25 mg by nebulization every 4 (four) hours as needed for wheezing or shortness of breath.     Historical Provider, MD  atorvastatin (LIPITOR) 80 MG tablet Take 1 tablet (80 mg total) by mouth daily at 6 PM. 03/05/16   Enedina Finner, MD  diltiazem (CARDIZEM CD) 180 MG 24 hr capsule Take 1 capsule (180 mg total) by mouth daily. 03/05/16   Enedina Finner, MD  furosemide (LASIX) 20 MG tablet Take 20 mg by mouth every morning.    Historical Provider, MD  glipiZIDE (GLUCOTROL) 5 MG tablet Take 0.5 tablets (2.5 mg total) by mouth daily before breakfast. 03/06/16   Enedina Finner, MD  meclizine (ANTIVERT) 25 MG tablet Take 1 tablet (25 mg total) by mouth 3 (three) times daily as needed for dizziness. 11/06/15   Enid Baas, MD  metoprolol (LOPRESSOR) 50 MG tablet Take 1 tablet (50 mg total) by mouth 3 (three) times daily. 02/10/16   Altamese Dilling, MD  mometasone-formoterol (DULERA) 200-5 MCG/ACT  AERO Inhale 2 puffs into the lungs 2 (two) times daily. 03/05/16   Enedina Finner, MD  nitroGLYCERIN (NITROSTAT) 0.4 MG SL tablet Place 1 tablet (0.4 mg total) under the tongue every 5 (five) minutes as needed for chest pain. 01/25/16   Sital Mody, MD  ondansetron (ZOFRAN ODT) 4 MG disintegrating tablet Take 1 tablet (4 mg total) by mouth every 8 (eight) hours as needed for nausea or vomiting. Patient not taking: Reported on 03/27/2016 11/06/15   Enid Baas, MD  oxyCODONE-acetaminophen (PERCOCET/ROXICET) 5-325 MG  tablet Take 1 tablet by mouth every 4 (four) hours as needed for moderate pain or severe pain. 03/05/16   Enedina Finner, MD  pantoprazole (PROTONIX) 40 MG tablet Take 1 tablet (40 mg total) by mouth daily. Patient taking differently: Take 40 mg by mouth daily as needed. For acid reflux 11/06/15   Enid Baas, MD  rivaroxaban (XARELTO) 20 MG TABS tablet Take 1 tablet (20 mg total) by mouth daily with supper. 03/03/16   Altamese Dilling, MD  traMADol (ULTRAM) 50 MG tablet Take 1 tablet (50 mg total) by mouth every 12 (twelve) hours as needed for moderate pain or severe pain. Patient not taking: Reported on 03/27/2016 02/10/16   Altamese Dilling, MD    Allergies Morphine and related    Social History Social History  Substance Use Topics  . Smoking status: Never Smoker  . Smokeless tobacco: Never Used  . Alcohol use No    Review of Systems Patient denies headaches, rhinorrhea, blurry vision, numbness, shortness of breath, chest pain, edema, cough, abdominal pain, nausea, vomiting, diarrhea, dysuria, fevers, rashes or hallucinations unless otherwise stated above in HPI. ____________________________________________   PHYSICAL EXAM:  VITAL SIGNS: Vitals:   04/18/16 1012  BP: (!) 146/90  Pulse: (!) 116  Resp: (!) 21  Temp: 97.5 F (36.4 C)    Constitutional: Alert and oriented. in no acute distress. Eyes: Conjunctivae are normal. PERRL. EOMI. Head: Atraumatic. Nose: No congestion/rhinnorhea. Mouth/Throat: Mucous membranes are moist.  Oropharynx non-erythematous. Neck: No stridor. Painless ROM. No cervical spine tenderness to palpation Hematological/Lymphatic/Immunilogical: No cervical lymphadenopathy. Cardiovascular: tachycardic irregularly irregular rhythm. Grossly normal heart sounds.  Good peripheral circulation. Respiratory: Normal respiratory effort.  No retractions. Lungs CTAB. Gastrointestinal: Soft and nontender. No distention. No abdominal bruits. No CVA  tenderness. Genitourinary:  Musculoskeletal: No lower extremity tenderness nor edema.  No joint effusions. Neurologic:  Normal speech and language. No gross focal neurologic deficits are appreciated. No gait instability. Skin:  Skin is warm, dry and intact. No rash noted. Psychiatric: Mood and affect are normal. Speech and behavior are normal.  ____________________________________________   LABS (all labs ordered are listed, but only abnormal results are displayed)  Results for orders placed or performed during the hospital encounter of 04/18/16 (from the past 24 hour(s))  CBC with Differential/Platelet     Status: Abnormal   Collection Time: 04/18/16 10:46 AM  Result Value Ref Range   WBC 7.7 3.6 - 11.0 K/uL   RBC 3.95 3.80 - 5.20 MIL/uL   Hemoglobin 10.4 (L) 12.0 - 16.0 g/dL   HCT 49.6 (L) 75.9 - 16.3 %   MCV 81.2 80.0 - 100.0 fL   MCH 26.4 26.0 - 34.0 pg   MCHC 32.5 32.0 - 36.0 g/dL   RDW 84.6 (H) 65.9 - 93.5 %   Platelets 305 150 - 440 K/uL   Neutrophils Relative % 69 %   Neutro Abs 5.3 1.4 - 6.5 K/uL   Lymphocytes Relative 22 %  Lymphs Abs 1.7 1.0 - 3.6 K/uL   Monocytes Relative 7 %   Monocytes Absolute 0.6 0.2 - 0.9 K/uL   Eosinophils Relative 1 %   Eosinophils Absolute 0.0 0 - 0.7 K/uL   Basophils Relative 1 %   Basophils Absolute 0.1 0 - 0.1 K/uL  Comprehensive metabolic panel     Status: Abnormal   Collection Time: 04/18/16 10:46 AM  Result Value Ref Range   Sodium 143 135 - 145 mmol/L   Potassium 4.3 3.5 - 5.1 mmol/L   Chloride 110 101 - 111 mmol/L   CO2 23 22 - 32 mmol/L   Glucose, Bld 184 (H) 65 - 99 mg/dL   BUN 31 (H) 6 - 20 mg/dL   Creatinine, Ser 4.09 (H) 0.44 - 1.00 mg/dL   Calcium 8.8 (L) 8.9 - 10.3 mg/dL   Total Protein 7.0 6.5 - 8.1 g/dL   Albumin 3.6 3.5 - 5.0 g/dL   AST 15 15 - 41 U/L   ALT 12 (L) 14 - 54 U/L   Alkaline Phosphatase 94 38 - 126 U/L   Total Bilirubin 0.3 0.3 - 1.2 mg/dL   GFR calc non Af Amer 38 (L) >60 mL/min   GFR calc Af  Amer 45 (L) >60 mL/min   Anion gap 10 5 - 15  Protime-INR     Status: None   Collection Time: 04/18/16 10:46 AM  Result Value Ref Range   Prothrombin Time 13.6 11.4 - 15.2 seconds   INR 1.04   APTT     Status: None   Collection Time: 04/18/16 10:46 AM  Result Value Ref Range   aPTT 26 24 - 36 seconds  Troponin I     Status: None   Collection Time: 04/18/16 10:46 AM  Result Value Ref Range   Troponin I <0.03 <0.03 ng/mL   ____________________________________________  EKG My review and personal interpretation at Time: 9:55   Indication: chest pain  Rate: 120  Rhythm: afib w rvr Axis: normal Other: non specific st changes, no STEMI ____________________________________________  RADIOLOGY  I personally reviewed all radiographic images ordered to evaluate for the above acute complaints and reviewed radiology reports and findings.  These findings were personally discussed with the patient.  Please see medical record for radiology report.  ____________________________________________   PROCEDURES  Procedure(s) performed:  Procedures    Critical Care performed: no ____________________________________________   INITIAL IMPRESSION / ASSESSMENT AND PLAN / ED COURSE  Pertinent labs & imaging results that were available during my care of the patient were reviewed by me and considered in my medical decision making (see chart for details).  DDX: ACS, pericarditis, esophagitis, boerhaaves, pe, dissection, pna, bronchitis, costochondritis   Altair Stanko is a 76 y.o. who presents to the ED with chief complaint of palpitations and with evidence of A. fib with RVR. At this point the patient is well-perfused and in no acute distress. After careful history it does appear that this is likely secondary to medication noncompliance due to confusion with her discharge instructions. I will restart the patient's Toprol as well as given a dose of IV Lopressor. We'll check electrolytes to  evaluate for any other evidence of dehydration or O abnormality.  The patient will be placed on continuous pulse oximetry and telemetry for monitoring.  Laboratory evaluation will be sent to evaluate for the above complaints.     Clinical Course as of Apr 18 1610  Wed Apr 18, 2016  1315 Differential [PR]  1336 Patient  now rate controlled after restarting her appropriate home medications. Do suspect this is secondary to a misunderstanding after being discharged. Patient remains hemodynamic stable.  [PR]    Clinical Course User Index [PR] Willy Eddy, MD   Spoke with Dr. Lady Gary, the patient's cardiologist who recommends such the patient to metoprolol tartrate twice a day as previously described. Spent time with the patient reeducated her on appropriate medication compliance. According to Dr. Lady Gary the patient has had long history of noncompliance with her medications. As the patient is currently rate controlled with no chest pain and otherwise a symptomatically feel patient is appropriate for outpatient follow-up.  Have discussed with the patient and available family all diagnostics and treatments performed thus far and all questions were answered to the best of my ability. The patient demonstrates understanding and agreement with plan.   ____________________________________________   FINAL CLINICAL IMPRESSION(S) / ED DIAGNOSES  Final diagnoses:  Atrial fibrillation with rapid ventricular response (HCC)      NEW MEDICATIONS STARTED DURING THIS VISIT:  New Prescriptions   No medications on file     Note:  This document was prepared using Dragon voice recognition software and may include unintentional dictation errors.    Willy Eddy, MD 04/18/16 [phone]

## 2016-04-24 IMAGING — CR DG CHEST 1V PORT
1 series · 1 of 1 positions shown · non-contrast
Comparison: 12/29/2013

CLINICAL DATA: Chest pain for 3 hr.

EXAM:
PORTABLE CHEST - 1 VIEW

[ap]
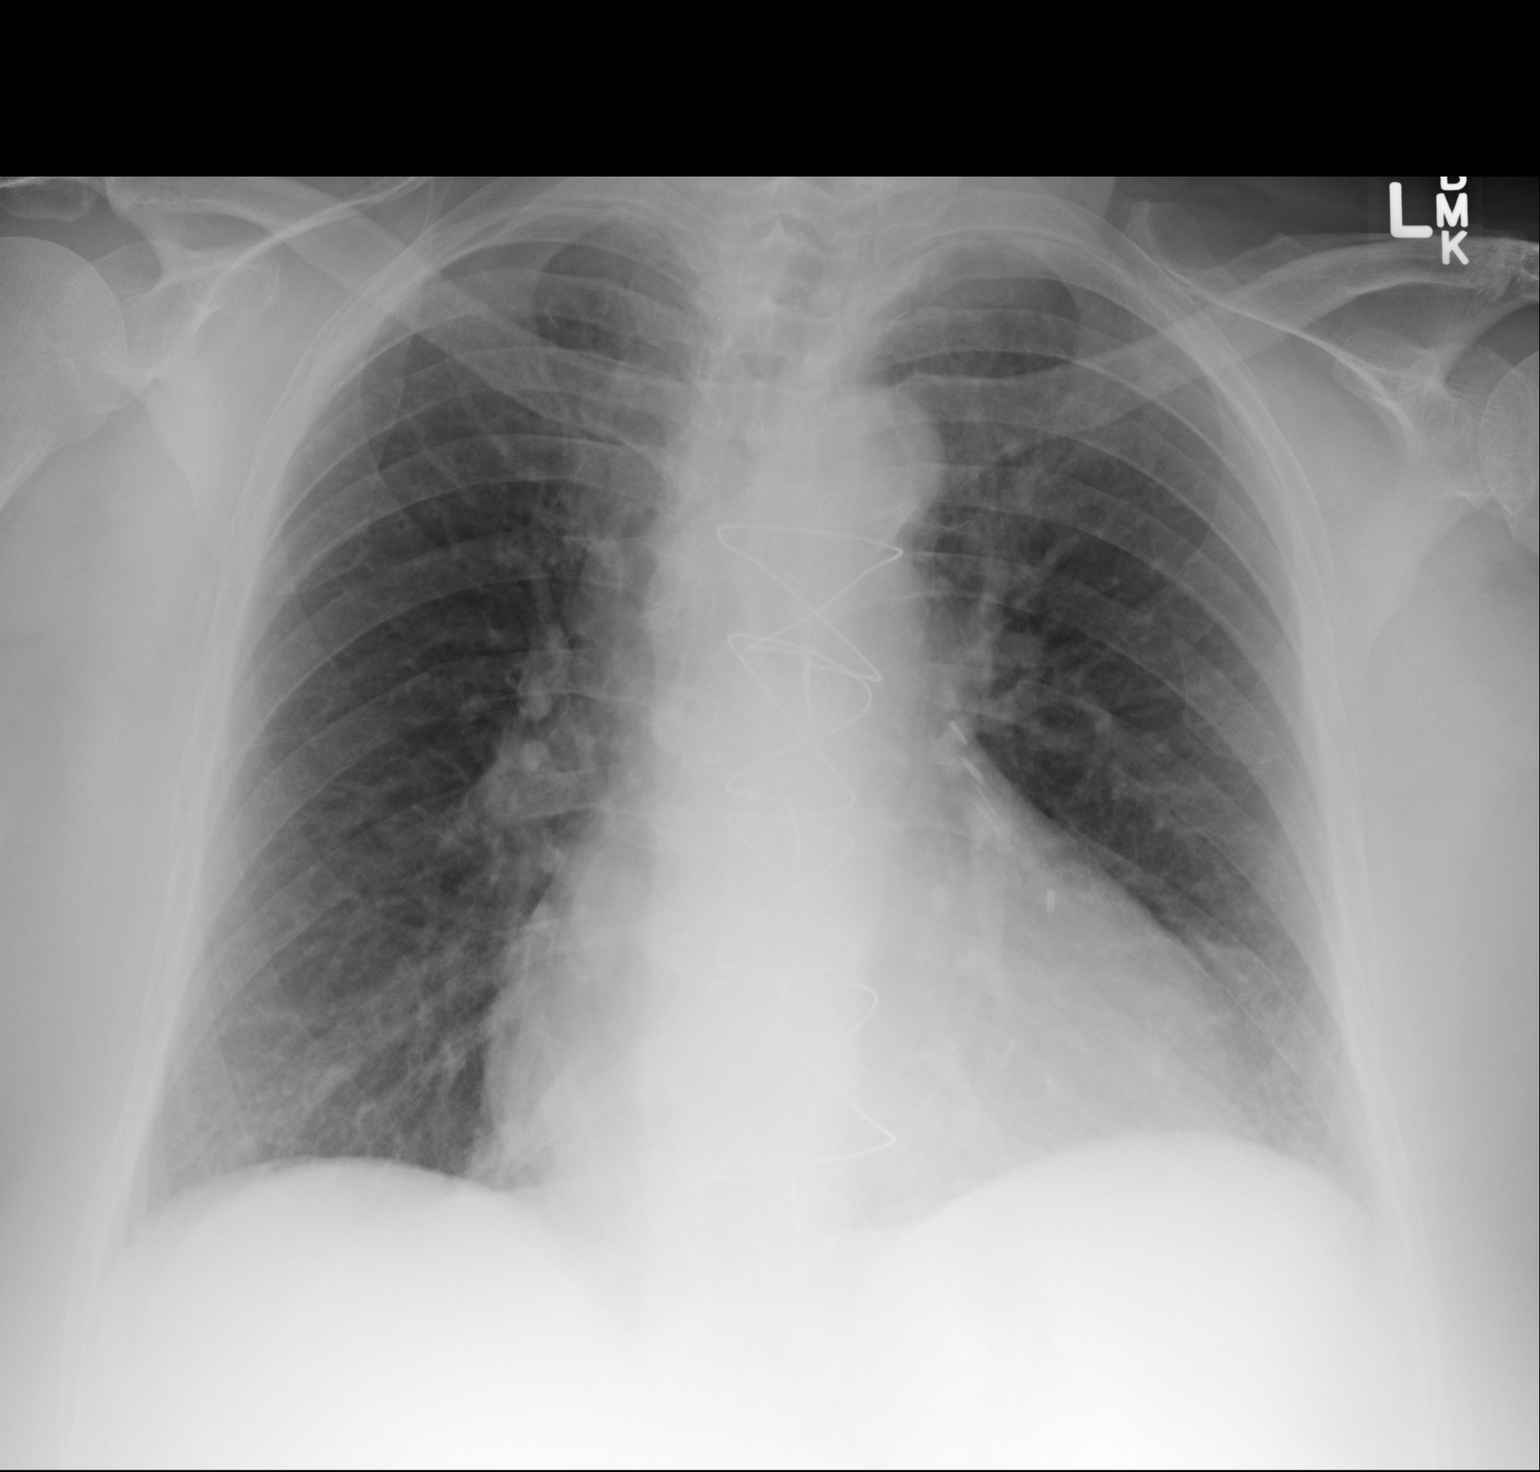

[1 of 1 positions shown; findings below may reference images not displayed]

FINDINGS: Moderate cardiomegaly. Normal vascularity. Clear lungs. No
pneumothorax. No pleural effusion. Postoperative change.
IMPRESSION: Cardiomegaly without decompensation.

## 2016-05-02 ENCOUNTER — Observation Stay
Admit: 2016-05-02 | Discharge: 2016-05-02 | Disposition: A | Discharge status: Home / Self Care | Payer: Medicare Other | Attending: Specialist | Admitting: Specialist

## 2016-05-02 ENCOUNTER — Encounter: Payer: Self-pay | Admitting: *Deleted

## 2016-05-02 ENCOUNTER — Emergency Department: Payer: Medicare Other

## 2016-05-02 ENCOUNTER — Observation Stay
Admission: EM | Admit: 2016-05-02 | Discharge: 2016-05-03 | Disposition: A | Discharge status: Home / Self Care | Payer: Medicare Other | Attending: Internal Medicine | Admitting: Internal Medicine

## 2016-05-02 DIAGNOSIS — M7989 Other specified soft tissue disorders: Secondary | ICD-10-CM | POA: Diagnosis not present

## 2016-05-02 DIAGNOSIS — Z7982 Long term (current) use of aspirin: Secondary | ICD-10-CM | POA: Insufficient documentation

## 2016-05-02 DIAGNOSIS — I2510 Atherosclerotic heart disease of native coronary artery without angina pectoris: Secondary | ICD-10-CM | POA: Diagnosis not present

## 2016-05-02 DIAGNOSIS — I130 Hypertensive heart and chronic kidney disease with heart failure and stage 1 through stage 4 chronic kidney disease, or unspecified chronic kidney disease: Secondary | ICD-10-CM | POA: Insufficient documentation

## 2016-05-02 DIAGNOSIS — I5023 Acute on chronic systolic (congestive) heart failure: Secondary | ICD-10-CM | POA: Insufficient documentation

## 2016-05-02 DIAGNOSIS — Z7951 Long term (current) use of inhaled steroids: Secondary | ICD-10-CM | POA: Insufficient documentation

## 2016-05-02 DIAGNOSIS — R7989 Other specified abnormal findings of blood chemistry: Secondary | ICD-10-CM

## 2016-05-02 DIAGNOSIS — E1122 Type 2 diabetes mellitus with diabetic chronic kidney disease: Secondary | ICD-10-CM | POA: Diagnosis not present

## 2016-05-02 DIAGNOSIS — R609 Edema, unspecified: Secondary | ICD-10-CM

## 2016-05-02 DIAGNOSIS — K219 Gastro-esophageal reflux disease without esophagitis: Secondary | ICD-10-CM | POA: Insufficient documentation

## 2016-05-02 DIAGNOSIS — R778 Other specified abnormalities of plasma proteins: Secondary | ICD-10-CM | POA: Diagnosis not present

## 2016-05-02 DIAGNOSIS — Z951 Presence of aortocoronary bypass graft: Secondary | ICD-10-CM | POA: Diagnosis not present

## 2016-05-02 DIAGNOSIS — I340 Nonrheumatic mitral (valve) insufficiency: Secondary | ICD-10-CM | POA: Diagnosis not present

## 2016-05-02 DIAGNOSIS — I248 Other forms of acute ischemic heart disease: Secondary | ICD-10-CM | POA: Diagnosis not present

## 2016-05-02 DIAGNOSIS — E782 Mixed hyperlipidemia: Secondary | ICD-10-CM | POA: Insufficient documentation

## 2016-05-02 DIAGNOSIS — I2489 Other forms of acute ischemic heart disease: Secondary | ICD-10-CM

## 2016-05-02 DIAGNOSIS — J45909 Unspecified asthma, uncomplicated: Secondary | ICD-10-CM | POA: Insufficient documentation

## 2016-05-02 DIAGNOSIS — Z7984 Long term (current) use of oral hypoglycemic drugs: Secondary | ICD-10-CM | POA: Diagnosis not present

## 2016-05-02 DIAGNOSIS — I700 Atherosclerosis of aorta: Secondary | ICD-10-CM | POA: Insufficient documentation

## 2016-05-02 DIAGNOSIS — N189 Chronic kidney disease, unspecified: Secondary | ICD-10-CM | POA: Insufficient documentation

## 2016-05-02 DIAGNOSIS — R079 Chest pain, unspecified: Secondary | ICD-10-CM | POA: Diagnosis present

## 2016-05-02 DIAGNOSIS — I350 Nonrheumatic aortic (valve) stenosis: Secondary | ICD-10-CM

## 2016-05-02 DIAGNOSIS — I482 Chronic atrial fibrillation: Principal | ICD-10-CM | POA: Insufficient documentation

## 2016-05-02 DIAGNOSIS — Z79899 Other long term (current) drug therapy: Secondary | ICD-10-CM | POA: Insufficient documentation

## 2016-05-02 DIAGNOSIS — I420 Dilated cardiomyopathy: Secondary | ICD-10-CM

## 2016-05-02 DIAGNOSIS — I4891 Unspecified atrial fibrillation: Secondary | ICD-10-CM

## 2016-05-02 DIAGNOSIS — I252 Old myocardial infarction: Secondary | ICD-10-CM | POA: Insufficient documentation

## 2016-05-02 DIAGNOSIS — Z91199 Patient's noncompliance with other medical treatment and regimen due to unspecified reason: Secondary | ICD-10-CM

## 2016-05-02 DIAGNOSIS — Z7901 Long term (current) use of anticoagulants: Secondary | ICD-10-CM | POA: Insufficient documentation

## 2016-05-02 DIAGNOSIS — Z9119 Patient's noncompliance with other medical treatment and regimen: Secondary | ICD-10-CM

## 2016-05-02 LAB — TROPONIN I
TROPONIN I: 0.26 ng/mL — AB (ref <0.03)
Troponin I: 0.26 ng/mL (ref <0.03)
Troponin I: 0.27 ng/mL (ref <0.03)

## 2016-05-02 LAB — BASIC METABOLIC PANEL
ANION GAP: 8 (ref 5–15)
BUN: 23 mg/dL — ABNORMAL HIGH (ref 6–20)
CALCIUM: 9 mg/dL (ref 8.9–10.3)
CHLORIDE: 108 mmol/L (ref 101–111)
CO2: 25 mmol/L (ref 22–32)
CREATININE: 1.36 mg/dL — AB (ref 0.44–1.00)
GFR calc non Af Amer: 37 mL/min — ABNORMAL LOW (ref >60)
GFR, EST AFRICAN AMERICAN: 43 mL/min — AB (ref >60)
Glucose, Bld: 123 mg/dL — ABNORMAL HIGH (ref 65–99)
Potassium: 4.1 mmol/L (ref 3.5–5.1)
SODIUM: 141 mmol/L (ref 135–145)

## 2016-05-02 LAB — CBC
HCT: 32.9 % — ABNORMAL LOW (ref 35.0–47.0)
HEMOGLOBIN: 10.8 g/dL — AB (ref 12.0–16.0)
MCH: 26.4 pg (ref 26.0–34.0)
MCHC: 32.8 g/dL (ref 32.0–36.0)
MCV: 80.4 fL (ref 80.0–100.0)
PLATELETS: 286 10*3/uL (ref 150–440)
RBC: 4.09 MIL/uL (ref 3.80–5.20)
RDW: 15.4 % — ABNORMAL HIGH (ref 11.5–14.5)
WBC: 7.8 10*3/uL (ref 3.6–11.0)

## 2016-05-02 LAB — GLUCOSE, CAPILLARY
GLUCOSE-CAPILLARY: 160 mg/dL — AB (ref 65–99)
Glucose-Capillary: 82 mg/dL (ref 65–99)

## 2016-05-02 MED ORDER — MOMETASONE FURO-FORMOTEROL FUM 200-5 MCG/ACT IN AERO
2.0000 | INHALATION_SPRAY | Freq: Two times a day (BID) | RESPIRATORY_TRACT | Status: DC
  Administered 2016-05-03: 2 via RESPIRATORY_TRACT
  Filled 2016-05-02 (×2): qty 8.8

## 2016-05-02 MED ORDER — DILTIAZEM HCL 25 MG/5ML IV SOLN
10.0000 mg | Freq: Once | INTRAVENOUS | Status: AC
  Administered 2016-05-02: 10 mg via INTRAVENOUS
  Filled 2016-05-02: qty 5

## 2016-05-02 MED ORDER — HYDROCORTISONE 1 % EX CREA
TOPICAL_CREAM | Freq: Four times a day (QID) | CUTANEOUS | Status: DC
  Administered 2016-05-02 – 2016-05-03 (×3): via TOPICAL
  Filled 2016-05-02: qty 28

## 2016-05-02 MED ORDER — PANTOPRAZOLE SODIUM 40 MG PO TBEC
40.0000 mg | DELAYED_RELEASE_TABLET | Freq: Every day | ORAL | Status: DC
  Administered 2016-05-03: 40 mg via ORAL
  Filled 2016-05-02: qty 1

## 2016-05-02 MED ORDER — ALBUTEROL SULFATE (2.5 MG/3ML) 0.083% IN NEBU: 2.5000 mg | INHALATION_SOLUTION | RESPIRATORY_TRACT | Status: DC | PRN

## 2016-05-02 MED ORDER — RIVAROXABAN 20 MG PO TABS
20.0000 mg | ORAL_TABLET | Freq: Every day | ORAL | Status: DC
  Administered 2016-05-02: 20 mg via ORAL
  Filled 2016-05-02: qty 1

## 2016-05-02 MED ORDER — NITROGLYCERIN 0.4 MG SL SUBL: 0.4000 mg | SUBLINGUAL_TABLET | SUBLINGUAL | Status: DC | PRN

## 2016-05-02 MED ORDER — LISINOPRIL 10 MG PO TABS
10.0000 mg | ORAL_TABLET | Freq: Every day | ORAL | Status: DC
  Administered 2016-05-03: 10 mg via ORAL
  Filled 2016-05-02: qty 1

## 2016-05-02 MED ORDER — INSULIN ASPART 100 UNIT/ML ~~LOC~~ SOLN
0.0000 [IU] | Freq: Three times a day (TID) | SUBCUTANEOUS | Status: DC
  Administered 2016-05-03: 2 [IU] via SUBCUTANEOUS
  Filled 2016-05-02: qty 2

## 2016-05-02 MED ORDER — INSULIN ASPART 100 UNIT/ML ~~LOC~~ SOLN: 0.0000 [IU] | Freq: Every day | SUBCUTANEOUS | Status: DC

## 2016-05-02 MED ORDER — ASPIRIN 81 MG PO CHEW
81.0000 mg | CHEWABLE_TABLET | Freq: Every day | ORAL | Status: DC
  Administered 2016-05-03: 81 mg via ORAL
  Filled 2016-05-02: qty 1

## 2016-05-02 MED ORDER — ONDANSETRON HCL 4 MG PO TABS: 4.0000 mg | ORAL_TABLET | Freq: Four times a day (QID) | ORAL | Status: DC | PRN

## 2016-05-02 MED ORDER — ONDANSETRON HCL 4 MG/2ML IJ SOLN: 4.0000 mg | Freq: Four times a day (QID) | INTRAMUSCULAR | Status: DC | PRN

## 2016-05-02 MED ORDER — GLIPIZIDE 5 MG PO TABS
2.5000 mg | ORAL_TABLET | Freq: Every day | ORAL | Status: DC
  Administered 2016-05-03: 2.5 mg via ORAL
  Filled 2016-05-02: qty 1

## 2016-05-02 MED ORDER — METOPROLOL TARTRATE 50 MG PO TABS
50.0000 mg | ORAL_TABLET | Freq: Three times a day (TID) | ORAL | Status: DC
  Administered 2016-05-02 – 2016-05-03 (×3): 50 mg via ORAL
  Filled 2016-05-02 (×3): qty 1

## 2016-05-02 MED ORDER — ACETAMINOPHEN 650 MG RE SUPP: 650.0000 mg | Freq: Four times a day (QID) | RECTAL | Status: DC | PRN

## 2016-05-02 MED ORDER — MECLIZINE HCL 25 MG PO TABS: 25.0000 mg | ORAL_TABLET | Freq: Three times a day (TID) | ORAL | Status: DC | PRN

## 2016-05-02 MED ORDER — ATORVASTATIN CALCIUM 20 MG PO TABS
80.0000 mg | ORAL_TABLET | Freq: Every day | ORAL | Status: DC
  Administered 2016-05-02: 80 mg via ORAL
  Filled 2016-05-02: qty 4

## 2016-05-02 MED ORDER — ACETAMINOPHEN 325 MG PO TABS
650.0000 mg | ORAL_TABLET | Freq: Four times a day (QID) | ORAL | Status: DC | PRN
  Administered 2016-05-03: 650 mg via ORAL
  Filled 2016-05-02: qty 2

## 2016-05-02 MED ORDER — LORAZEPAM 2 MG/ML IJ SOLN
1.0000 mg | Freq: Once | INTRAMUSCULAR | Status: AC
  Administered 2016-05-02: 1 mg via INTRAVENOUS
  Filled 2016-05-02: qty 1

## 2016-05-02 MED ORDER — SODIUM CHLORIDE 0.9% FLUSH
3.0000 mL | Freq: Two times a day (BID) | INTRAVENOUS | Status: DC
  Administered 2016-05-02 – 2016-05-03 (×2): 3 mL via INTRAVENOUS

## 2016-05-02 MED ORDER — DILTIAZEM HCL ER COATED BEADS 180 MG PO CP24
180.0000 mg | ORAL_CAPSULE | Freq: Every day | ORAL | Status: DC
  Administered 2016-05-02 – 2016-05-03 (×2): 180 mg via ORAL
  Filled 2016-05-02 (×2): qty 1

## 2016-05-02 MED ORDER — FUROSEMIDE 20 MG PO TABS
20.0000 mg | ORAL_TABLET | Freq: Every morning | ORAL | Status: DC
  Administered 2016-05-03: 20 mg via ORAL
  Filled 2016-05-02: qty 1

## 2016-05-02 MED ORDER — SODIUM CHLORIDE 0.9 % IV SOLN
Freq: Once | INTRAVENOUS | Status: AC
  Administered 2016-05-02: 13:00:00 via INTRAVENOUS

## 2016-05-02 NOTE — ED Triage Notes (Addendum)
Arrives via EMS from home with chest pain and a headache, states increased swelling in lower ext, states hx of afib, awake and alert upon arrival

## 2016-05-02 NOTE — ED Provider Notes (Signed)
[facility] Emergency Department Provider Note        Time seen: ----------------------------------------- 12:51 PM on 05/02/2016 -----------------------------------------    I have reviewed the triage vital signs and the nursing notes.   HISTORY  Chief Complaint Chest Pain    HPI Robin [name] is a 76 y.o. female who presents to ER being brought by EMS from home for chest pain and headache. Patient states she's had increased swelling in her lower extremities. She reports a history of atrial fibrillation. Current pain is 8 out of 10 in her chest and head. Nothing makes her symptoms better or worse.   Past Medical History:  Diagnosis Date  . Afib (HCC)   . Anxiety   . Asthma   . CHF (congestive heart failure) (HCC)   . Coronary artery disease   . Diabetes mellitus without complication (HCC)   . Hyperlipidemia   . Hypertension     Patient Active Problem List   Diagnosis Date Noted  . 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 Renal artery stenosis (HCC) 02/28/2016  . Accelerated hypertension 02/28/2016  . Mild dementia 02/28/2016  . Noncompliance 02/07/2016  . Renal infarct (HCC) 02/06/2016  . Atrial fibrillation with rapid ventricular response (HCC) 02/06/2016  . A-fib (HCC) 01/24/2016  . HTN (hypertension) 11/04/2015  . HLD (hyperlipidemia) 11/04/2015  . CAD (coronary artery disease) 11/04/2015  . Chronic systolic CHF (congestive heart failure) (HCC) 11/04/2015  . Diabetes (HCC) 11/04/2015    Past Surgical History:  Procedure Laterality Date  . CORONARY ARTERY BYPASS GRAFT    . NO PAST SURGERIES    . PERIPHERAL VASCULAR CATHETERIZATION Left 03/02/2016   Procedure: Renal Intervention;  Surgeon: Renford Dills, MD;  Location: [facility] INVASIVE CV LAB;  Service: Cardiovascular;  Laterality: Left;    Allergies Morphine and related  Social History Social History  Substance Use Topics  . Smoking status: Never Smoker  . Smokeless  tobacco: Never Used  . Alcohol use No    Review of Systems Constitutional: Negative for fever. Cardiovascular: Positive for chest pain Respiratory: Negative for shortness of breath. Gastrointestinal: Negative for abdominal pain, vomiting and diarrhea. Genitourinary: Negative for dysuria. Musculoskeletal: Negative for back pain. Skin: Negative for rash. Neurological: Positive for headache  10-point ROS otherwise negative.  ____________________________________________   PHYSICAL EXAM:  VITAL SIGNS: ED Triage Vitals  Enc Vitals Group     BP 05/02/16 1212 (!) 167/93     Pulse Rate 05/02/16 1212 (!) 116     Resp 05/02/16 1212 18     Temp 05/02/16 1212 98.5 F (36.9 C)     Temp Source 05/02/16 1212 Oral     SpO2 05/02/16 1230 100 %     Weight 05/02/16 1212 155 lb (70.3 kg)     Height 05/02/16 1212 5\' 4"  (1.626 m)     Head Circumference --      Peak Flow --      Pain Score 05/02/16 1212 8     Pain Loc --      Pain Edu? --      Excl. in GC? --     Constitutional: Alert and oriented. Well appearing and in no distress. Eyes: Conjunctivae are normal. PERRL. Normal extraocular movements. ENT   Head: Normocephalic and atraumatic.   Nose: No congestion/rhinnorhea.   Mouth/Throat: Mucous membranes are moist.   Neck: No stridor. Cardiovascular: Rapid rate, irregular rhythm. No murmurs, rubs, or gallops. Respiratory: Normal respiratory effort without tachypnea nor retractions. Breath sounds  are clear and equal bilaterally. No wheezes/rales/rhonchi. Gastrointestinal: Soft and nontender. Normal bowel sounds Musculoskeletal: Nontender with normal range of motion in all extremities. No lower extremity tenderness nor edema. Neurologic:  Normal speech and language. No gross focal neurologic deficits are appreciated.  Skin:  Skin is warm, dry and intact. No rash noted. Psychiatric: Mood and affect are normal. Speech and behavior are normal.   ____________________________________________  EKG: Interpreted by me. A. fib with RVR rate is 132 bpm, normal QRS width, long QT, LVH with repolarization abnormality  ____________________________________________  ED COURSE:  Pertinent labs & imaging results that were available during my care of the patient were reviewed by me and considered in my medical decision making (see chart for details). Patient presents to ER with chest pain and headache, we will assess with basic labs and imaging. She may require rate control for atrial fibrillation   Procedures ____________________________________________   LABS (pertinent positives/negatives)  Labs Reviewed  BASIC METABOLIC PANEL - Abnormal; Notable for the following:       Result Value   Glucose, Bld 123 (*)    BUN 23 (*)    Creatinine, Ser 1.36 (*)    GFR calc non Af Amer 37 (*)    GFR calc Af Amer 43 (*)    All other components within normal limits  CBC - Abnormal; Notable for the following:    Hemoglobin 10.8 (*)    HCT 32.9 (*)    RDW 15.4 (*)    All other components within normal limits  TROPONIN I - Abnormal; Notable for the following:    Troponin I 0.26 (*)    All other components within normal limits    RADIOLOGY  Chest x-ray IMPRESSION: No edema or consolidation. Stable cardiomegaly. There is aortic atherosclerosis. ____________________________________________  FINAL ASSESSMENT AND PLAN  Chest pain, headache, Atrial fibrillation with a rapid ventricular response  Plan: Patient with labs and imaging as dictated above. Patient presents to the ER with multiple complaints which are at least affected by anxiety. She was given IV Ativan as well as IV Cardizem for rate control. Troponin is found to be elevated at 0.26. Her troponin had normalized from a recent and STEMI. I will discuss with the hospitalist for admission, she will need repeat troponin and repeat cardiology evaluation.   Emily Filbert,  MD   Note: This note was generated in part or whole with voice recognition software. Voice recognition is usually quite accurate but there are transcription errors that can and very often do occur. I apologize for any typographical errors that were not detected and corrected.     Emily Filbert, MD 05/02/16 1320

## 2016-05-02 NOTE — ED Notes (Signed)
Patient transported to radiology

## 2016-05-02 NOTE — H&P (Signed)
[facility] - [place] at [facility]   PATIENT NAME: Robin [name]    MR#:  [id number]  DATE OF BIRTH:  November 21, 1940  DATE OF ADMISSION:  05/02/2016  PRIMARY CARE PHYSICIAN: Leanna Sato, MD   REQUESTING/REFERRING PHYSICIAN: Dr. Daryel November.   CHIEF COMPLAINT:   Chief Complaint  Patient presents with  . Chest Pain    HISTORY OF PRESENT ILLNESS:  Robin [name]  is a 76 y.o. female with a known history of Chronic atrial fibrillation, anxiety, asthma, history of CHF, history of coronary artery disease, diabetes, hypertension, hyperlipidemia, medical noncompliance who presents to the hospital due to chest pain and palpitations. Patient says that she woke up with chest pain on 2:00 this morning. The pain was nonradiating and not associated with any nausea vomiting or diaphoresis, but she did complain of some palpitations. She presented to the emergency room was noted to be in atrial fibrillation with rapid ventricular response, she was given some IV Cardizem which improved her heart rates. Her chest pain has not resolved but her troponin was noted to be mildly elevated 0.26. Hospitalist services were contacted further treatment and evaluation. Patient denies any fevers, chills, cough, nausea, vomiting, abdominal pain or any other associated symptoms presently.  PAST MEDICAL HISTORY:   Past Medical History:  Diagnosis Date  . Afib (HCC)   . Anxiety   . Asthma   . CHF (congestive heart failure) (HCC)   . Coronary artery disease   . Diabetes mellitus without complication (HCC)   . Hyperlipidemia   . Hypertension     PAST SURGICAL HISTORY:   Past Surgical History:  Procedure Laterality Date  . CORONARY ARTERY BYPASS GRAFT    . NO PAST SURGERIES    . PERIPHERAL VASCULAR CATHETERIZATION Left 03/02/2016   Procedure: Renal Intervention;  Surgeon: Renford Dills, MD;  Location: [facility] INVASIVE CV LAB;  Service: Cardiovascular;  Laterality: Left;    SOCIAL  HISTORY:   Social History  Substance Use Topics  . Smoking status: Never Smoker  . Smokeless tobacco: Never Used  . Alcohol use No    FAMILY HISTORY:   Family History  Problem Relation Age of Onset  . Hypertension Mother   . Heart failure Mother   . Heart disease Father     DRUG ALLERGIES:   Allergies  Allergen Reactions  . Morphine And Related Other (See Comments)    Patient states it makes her "space out"    REVIEW OF SYSTEMS:   Review of Systems  Constitutional: Negative for fever and weight loss.  HENT: Negative for congestion, nosebleeds and tinnitus.   Eyes: Negative for blurred vision, double vision and redness.  Respiratory: Negative for cough, hemoptysis and shortness of breath.   Cardiovascular: Positive for chest pain and palpitations. Negative for orthopnea, leg swelling and PND.  Gastrointestinal: Negative for abdominal pain, diarrhea, melena, nausea and vomiting.  Genitourinary: Negative for dysuria, hematuria and urgency.  Musculoskeletal: Negative for falls and joint pain.  Neurological: Negative for dizziness, tingling, sensory change, focal weakness, seizures, weakness and headaches.  Endo/Heme/Allergies: Negative for polydipsia. Does not bruise/bleed easily.  Psychiatric/Behavioral: Negative for depression and memory loss. The patient is not nervous/anxious.     MEDICATIONS AT HOME:   Prior to Admission medications   Medication Sig Start Date End Date Taking? Authorizing Provider  albuterol (ACCUNEB) 1.25 MG/3ML nebulizer solution Take 1.25 mg by nebulization every 4 (four) hours as needed for wheezing or shortness of breath.    Yes Historical  Provider, MD  aspirin 81 MG chewable tablet Chew 81 mg by mouth daily. 04/13/16 05/13/16 Yes Historical Provider, MD  atorvastatin (LIPITOR) 80 MG tablet Take 1 tablet (80 mg total) by mouth daily at 6 PM. 03/05/16  Yes Enedina Finner, MD  diltiazem (CARDIZEM CD) 180 MG 24 hr capsule Take 1 capsule (180 mg total) by  mouth daily. 03/05/16  Yes Enedina Finner, MD  furosemide (LASIX) 20 MG tablet Take 20 mg by mouth every morning.   Yes Historical Provider, MD  glipiZIDE (GLUCOTROL) 5 MG tablet Take 0.5 tablets (2.5 mg total) by mouth daily before breakfast. 03/06/16  Yes Enedina Finner, MD  lisinopril (PRINIVIL,ZESTRIL) 10 MG tablet Take 10 mg by mouth daily. 04/23/16 05/23/16 Yes Historical Provider, MD  meclizine (ANTIVERT) 25 MG tablet Take 1 tablet (25 mg total) by mouth 3 (three) times daily as needed for dizziness. 11/06/15  Yes Enid Baas, MD  metoprolol (LOPRESSOR) 50 MG tablet Take 1 tablet (50 mg total) by mouth 3 (three) times daily. 02/10/16  Yes Altamese Dilling, MD  mometasone-formoterol (DULERA) 200-5 MCG/ACT AERO Inhale 2 puffs into the lungs 2 (two) times daily. 03/05/16  Yes Enedina Finner, MD  nitroGLYCERIN (NITROSTAT) 0.4 MG SL tablet Place 1 tablet (0.4 mg total) under the tongue every 5 (five) minutes as needed for chest pain. 01/25/16  Yes Sital Mody, MD  ondansetron (ZOFRAN ODT) 4 MG disintegrating tablet Take 1 tablet (4 mg total) by mouth every 8 (eight) hours as needed for nausea or vomiting. 11/06/15  Yes Enid Baas, MD  pantoprazole (PROTONIX) 40 MG tablet Take 1 tablet (40 mg total) by mouth daily. Patient taking differently: Take 40 mg by mouth daily as needed. For acid reflux 11/06/15  Yes Enid Baas, MD  rivaroxaban (XARELTO) 20 MG TABS tablet Take 1 tablet (20 mg total) by mouth daily with supper. 03/03/16  Yes Altamese Dilling, MD  traMADol (ULTRAM) 50 MG tablet Take 1 tablet (50 mg total) by mouth every 12 (twelve) hours as needed for moderate pain or severe pain. 02/10/16  Yes Altamese Dilling, MD  oxyCODONE-acetaminophen (PERCOCET/ROXICET) 5-325 MG tablet Take 1 tablet by mouth every 4 (four) hours as needed for moderate pain or severe pain. 03/05/16   Enedina Finner, MD      VITAL SIGNS:  Blood pressure (!) 131/98, pulse (!) 125, temperature 98.5 F (36.9 C),  temperature source Oral, resp. rate 17, height 5\' 4"  (1.626 m), weight 70.3 kg (155 lb), SpO2 98 %.  PHYSICAL EXAMINATION:  Physical Exam  GENERAL:  76 y.o.-year-old patient lying in bed in no acute distress.  EYES: Pupils equal, round, reactive to light and accommodation. No scleral icterus. Extraocular muscles intact.  HEENT: Head atraumatic, normocephalic. Oropharynx and nasopharynx clear. No oropharyngeal erythema, moist oral mucosa  NECK:  Supple, no jugular venous distention. No thyroid enlargement, no tenderness.  LUNGS: Normal breath sounds bilaterally, no wheezing, rales, rhonchi. No use of accessory muscles of respiration.  CARDIOVASCULAR: S1, S2 Irregular. No murmurs, rubs, gallops, clicks.  ABDOMEN: Soft, nontender, nondistended. Bowel sounds present. No organomegaly or mass.  EXTREMITIES: No pedal edema, cyanosis, or clubbing. + 2 pedal & radial pulses b/l.   NEUROLOGIC: Cranial nerves II through XII are intact. No focal Motor or sensory deficits appreciated b/l PSYCHIATRIC: The patient is alert and oriented x 3.  SKIN: No obvious rash, lesion, or ulcer.   LABORATORY PANEL:   CBC  Recent Labs Lab 05/02/16 1221  WBC 7.8  HGB 10.8*  HCT 32.9*  PLT 286   ------------------------------------------------------------------------------------------------------------------  Chemistries   Recent Labs Lab 05/02/16 1221  NA 141  K 4.1  CL 108  CO2 25  GLUCOSE 123*  BUN 23*  CREATININE 1.36*  CALCIUM 9.0   ------------------------------------------------------------------------------------------------------------------  Cardiac Enzymes  Recent Labs Lab 05/02/16 1221  TROPONINI 0.26*   ------------------------------------------------------------------------------------------------------------------  RADIOLOGY:  Dg Chest 2 View  Result Date: 05/02/2016 CLINICAL DATA:  Chest pain EXAM: CHEST  2 VIEW COMPARISON:  April 18, 2016 FINDINGS: There is no edema or  consolidation. Heart is mildly enlarged with pulmonary vascularity within normal limits. No adenopathy. Patient is status post coronary artery bypass grafting. No adenopathy. There is atherosclerotic calcification aorta. No bone lesions. IMPRESSION: No edema or consolidation. Stable cardiomegaly. There is aortic atherosclerosis. Electronically Signed   By: Bretta Bang III M.D.   On: 05/02/2016 12:50     IMPRESSION AND PLAN:   76 year old female with past medical history of hypertension, hyperlipidemia, anxiety, medical noncompliance, atrial fibrillation, coronary artery disease, diabetes or sensory the hospital due to chest pain and palpitations.  1. Chest pain-patient does have significant cardiac risk factors given her previous history of coronary disease, diabetes. -Her first troponin is mildly elevated 0.26. -I'll observe on telemetry, cycle her cardiac markers, we'll get a cardiology consult, check a two-dimensional echocardiogram. -Continue aspirin, statin, beta blocker, lisinopril, nitroglycerin.  2. Atrial fibrillation with rapid ventricular response-patient received a bolus of IV Cardizem and her heart rates are much improved now. -I will continue metoprolol, Cardizem. -Continue Xarelto.  3. GERD-continue Protonix.  4. Hypertension-continue Cardizem, metoprolol, lisinopril.  5. History of CHF-patient is clinically not in congestive heart failure.  -continue Lasix, beta blocker and lisinopril.  6. COPD-no acute exacerbation, continue Dulera    All the records are reviewed and case discussed with ED provider. Management plans discussed with the patient, family and they are in agreement.  CODE STATUS: Full Code  TOTAL TIME TAKING CARE OF THIS PATIENT: 45 minutes.    Houston Siren M.D on 05/02/2016 at 2:01 PM  Between 7am to 6pm - Pager - [phone]  After 6pm go to [email or web address] - password [email or web address] [facility]  [facility]  Office   [phone]  CC: Primary care physician; Leanna Sato, MD

## 2016-05-02 NOTE — ED Notes (Signed)
Upon entering room, patient found attempting to get out of bed. Patient ambulated to commode. Bed alarm placed on patient as well as yellow socks. Call light within reach. Patient denies any needs at this time.

## 2016-05-03 DIAGNOSIS — I420 Dilated cardiomyopathy: Secondary | ICD-10-CM

## 2016-05-03 DIAGNOSIS — I4891 Unspecified atrial fibrillation: Secondary | ICD-10-CM

## 2016-05-03 DIAGNOSIS — I248 Other forms of acute ischemic heart disease: Secondary | ICD-10-CM

## 2016-05-03 DIAGNOSIS — I340 Nonrheumatic mitral (valve) insufficiency: Secondary | ICD-10-CM

## 2016-05-03 DIAGNOSIS — I350 Nonrheumatic aortic (valve) stenosis: Secondary | ICD-10-CM

## 2016-05-03 DIAGNOSIS — Z91199 Patient's noncompliance with other medical treatment and regimen due to unspecified reason: Secondary | ICD-10-CM

## 2016-05-03 DIAGNOSIS — Z9119 Patient's noncompliance with other medical treatment and regimen: Secondary | ICD-10-CM

## 2016-05-03 DIAGNOSIS — I482 Chronic atrial fibrillation: Secondary | ICD-10-CM | POA: Diagnosis not present

## 2016-05-03 LAB — ECHOCARDIOGRAM COMPLETE
HEIGHTINCHES: 64 in
WEIGHTICAEL: 2480 [oz_av]

## 2016-05-03 LAB — BASIC METABOLIC PANEL
Anion gap: 5 (ref 5–15)
BUN: 24 mg/dL — AB (ref 6–20)
CHLORIDE: 111 mmol/L (ref 101–111)
CO2: 28 mmol/L (ref 22–32)
CREATININE: 1.21 mg/dL — AB (ref 0.44–1.00)
Calcium: 8.3 mg/dL — ABNORMAL LOW (ref 8.9–10.3)
GFR calc Af Amer: 49 mL/min — ABNORMAL LOW (ref >60)
GFR calc non Af Amer: 43 mL/min — ABNORMAL LOW (ref >60)
GLUCOSE: 133 mg/dL — AB (ref 65–99)
Potassium: 3.9 mmol/L (ref 3.5–5.1)
Sodium: 144 mmol/L (ref 135–145)

## 2016-05-03 LAB — GLUCOSE, CAPILLARY
Glucose-Capillary: 106 mg/dL — ABNORMAL HIGH (ref 65–99)
Glucose-Capillary: 175 mg/dL — ABNORMAL HIGH (ref 65–99)

## 2016-05-03 LAB — TROPONIN I: Troponin I: 0.25 ng/mL (ref <0.03)

## 2016-05-03 LAB — CBC
HCT: 28.7 % — ABNORMAL LOW (ref 35.0–47.0)
Hemoglobin: 9.4 g/dL — ABNORMAL LOW (ref 12.0–16.0)
MCH: 26.2 pg (ref 26.0–34.0)
MCHC: 32.8 g/dL (ref 32.0–36.0)
MCV: 80 fL (ref 80.0–100.0)
PLATELETS: 234 10*3/uL (ref 150–440)
RBC: 3.59 MIL/uL — ABNORMAL LOW (ref 3.80–5.20)
RDW: 15.2 % — AB (ref 11.5–14.5)
WBC: 6.8 10*3/uL (ref 3.6–11.0)

## 2016-05-03 MED ORDER — RIVAROXABAN 15 MG PO TABS: 15.0000 mg | ORAL_TABLET | Freq: Every day | ORAL | 0 refills | Status: DC

## 2016-05-03 MED ORDER — RIVAROXABAN 15 MG PO TABS
15.0000 mg | ORAL_TABLET | Freq: Every day | ORAL | Status: DC
  Filled 2016-05-03: qty 1

## 2016-05-03 NOTE — Discharge Summary (Signed)
[facility] - [place] at [facility]   PATIENT NAME: Robin [name]    MR#:  [id number]  DATE OF BIRTH:  March 08, 1941  DATE OF ADMISSION:  05/02/2016 ADMITTING PHYSICIAN: Houston Siren, MD  DATE OF DISCHARGE: No discharge date for patient encounter.  PRIMARY CARE PHYSICIAN: Leanna Sato, MD     ADMISSION DIAGNOSIS:  Atrial fibrillation with rapid ventricular response (HCC) [I48.91] Elevated troponin I level [R74.8]  DISCHARGE DIAGNOSIS:  Principal Problem:   Chest pain Active Problems:   Atrial fibrillation with RVR (HCC)   Congestive dilated cardiomyopathy (HCC)   Demand ischemia (HCC)   Moderate aortic stenosis   Moderate mitral regurgitation   History of noncompliance with medical treatment   SECONDARY DIAGNOSIS:   Past Medical History:  Diagnosis Date  . Afib (HCC)   . Anxiety   . Asthma   . CHF (congestive heart failure) (HCC)   . Coronary artery disease   . Diabetes mellitus without complication (HCC)   . Hyperlipidemia   . Hypertension     .pro HOSPITAL COURSE:  The patient is a 76 year old Caucasian female with a history significant for history of known aortic valve stenosis, chronic atrial fibrillation, diabetes, essential hypertension, hyperlipidemia, coronary artery disease, sick ED, who presented to the hospital with complaints of shortness of breath, fatigue, lower extremity swelling consistent with acute on chronic systolic CHF. Her chest x-ray revealed no edema or consolidation. She was admitted for diuresis. And heart rate control, since her heart rate was around 120. It appears that she may not be taking her home medications consistently, in fact, when she was given medications in the hospital, she drop them under the sheets and did not take them. She was educated about need to the medications in strength in her heart.she had an echocardiogram repeated, revealing ejection fraction of 25-30%, diffuse hypokinesis, moderate aortic  stenosis , moderate mitral regurgitation.patient was seen by cardiologist, Dr. Gwen Pounds who recommended to continue current therapy, follow-up with primary cardiologist as outpatient. Discussion by problem: #1. Acute on chronic systolic CHF, patient is to continue diuresis with Lasix, continue beta blockers, ACE inhibitor, follow up with cardiologist for further recommendations, she was advised to weigh herself frequently #2. A. 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 May need to change Xarelto to Eliquis if kidney function worsens as time goes on #5. Elevated troponin, demand ischemia, no acute coronary syndrome per cardiologist #6. Lower extremity swelling, due to right-sided heart failure, patient had Doppler ultrasound recently, she is on Xarelto , continue the same, Unna boots were placed upon discharge, to be changed every 1 week DISCHARGE CONDITIONS:   stable  CONSULTS OBTAINED:  Treatment Team:  Lamar Blinks, MD  DRUG ALLERGIES:   Allergies  Allergen Reactions  . Morphine And Related Other (See Comments)    Patient states it makes her "space out"    DISCHARGE MEDICATIONS:   Current Discharge Medication List    CONTINUE these medications which have CHANGED   Details  Rivaroxaban (XARELTO) 15 MG TABS tablet Take 1 tablet (15 mg total) by  mouth daily with supper. Qty: 30 tablet, Refills: 0      CONTINUE these medications which have NOT CHANGED   Details  albuterol (ACCUNEB) 1.25 MG/3ML nebulizer solution Take 1.25 mg by  nebulization every 4 (four) hours as needed for wheezing or shortness of breath.     aspirin 81 MG chewable tablet Chew 81 mg by mouth daily.    atorvastatin (LIPITOR) 80 MG tablet Take 1 tablet (80 mg total) by mouth daily at 6 PM. Qty: 30 tablet, Refills: 1    diltiazem (CARDIZEM CD) 180 MG 24 hr capsule Take 1 capsule (180 mg total) by mouth daily. Qty: 60 capsule, Refills: 0    furosemide (LASIX) 20 MG tablet Take 20 mg by mouth every morning.    glipiZIDE (GLUCOTROL) 5 MG tablet Take 0.5 tablets (2.5 mg total) by mouth daily before breakfast. Qty: 30 tablet, Refills: 1    lisinopril (PRINIVIL,ZESTRIL) 10 MG tablet Take 10 mg by mouth daily.    meclizine (ANTIVERT) 25 MG tablet Take 1 tablet (25 mg total) by mouth 3 (three) times daily as needed for dizziness. Qty: 30 tablet, Refills: 0    metoprolol (LOPRESSOR) 50 MG tablet Take 1 tablet (50 mg total) by mouth 3 (three) times daily. Qty: 90 tablet, Refills: 0    mometasone-formoterol (DULERA) 200-5 MCG/ACT AERO Inhale 2 puffs into the lungs 2 (two) times daily. Qty: 1 Inhaler, Refills: 0    nitroGLYCERIN (NITROSTAT) 0.4 MG SL tablet Place 1 tablet (0.4 mg total) under the tongue every 5 (five) minutes as needed for chest pain. Qty: 30 tablet, Refills: 0    ondansetron (ZOFRAN ODT) 4 MG disintegrating tablet Take 1 tablet (4 mg total) by mouth every 8 (eight) hours as needed for nausea or vomiting. Qty: 20 tablet, Refills: 0    pantoprazole (PROTONIX) 40 MG tablet Take 1 tablet (40 mg total) by mouth daily. Qty: 30 tablet, Refills: 2    oxyCODONE-acetaminophen (PERCOCET/ROXICET) 5-325 MG tablet Take 1 tablet by mouth every 4 (four) hours as needed for moderate pain or severe pain. Qty: 25 tablet, Refills: 0         DISCHARGE INSTRUCTIONS:    The patient is to follow-up with primary care physician and primary cardiologist as outpatient  If you experience worsening of your admission symptoms, develop shortness of  breath, life threatening emergency, suicidal or homicidal thoughts you must seek medical attention immediately by calling 911 or calling your MD immediately  if symptoms less severe.  You Must read complete instructions/literature along with all the possible adverse reactions/side effects for all the Medicines you take and that have been prescribed to you. Take any new Medicines after you have completely understood and accept all the possible adverse reactions/side effects.   Please note  You were cared for by a hospitalist during your hospital stay. If you have any questions about your discharge medications or the care you received while you were in the hospital after you are discharged, you can call the unit and asked to speak with the hospitalist on call if the hospitalist that took care of you is not available. Once you are discharged, your primary care physician will handle any further medical issues. Please note that NO REFILLS for any discharge medications will be authorized once you are discharged, as it is imperative that you return to your primary care physician (or establish a relationship with a primary care physician if you do not have one) for your aftercare needs so that  they can reassess your need for medications and monitor your lab values.    Today   CHIEF COMPLAINT:   Chief Complaint  Patient presents with  . Chest Pain    HISTORY OF PRESENT ILLNESS:  Miel Wisener  is a 76 y.o. female with a known history of  aortic valve stenosis, chronic atrial fibrillation, diabetes, essential hypertension, hyperlipidemia, coronary artery disease, sick ED, who presented to the hospital with complaints of shortness of breath, fatigue, lower extremity swelling consistent with acute on chronic systolic CHF. Her chest x-ray revealed no edema or consolidation. She was admitted for diuresis. And heart rate control, since her heart rate was around 120. It appears that she may not be taking her  home medications consistently, in fact, when she was given medications in the hospital, she drop them under the sheets and did not take them. She was educated about need to the medications in strength in her heart.she had an echocardiogram repeated, revealing ejection fraction of 25-30%, diffuse hypokinesis, moderate aortic stenosis , moderate mitral regurgitation.patient was seen by cardiologist, Dr. Gwen Pounds who recommended to continue current therapy, follow-up with primary cardiologist as outpatient. Discussion by problem: #1. Acute on chronic systolic CHF, patient is to continue diuresis with Lasix, continue beta blockers, ACE inhibitor, follow up with cardiologist for further recommendations, she was advised to weigh herself frequently #2. A. 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 May need to change Xarelto to Eliquis if kidney function worsens as time goes on #5. Elevated troponin, demand ischemia, no acute coronary syndrome per cardiologist #6. Lower extremity swelling, due to right-sided heart failure, patient had Doppler ultrasound recently, she is on Xarelto , continue the same, Unna boots were placed upon discharge, to be changed every 1 week    VITAL SIGNS:  Blood pressure (!) 144/79, pulse 71, temperature 98.1 F (36.7 C), temperature source Oral, resp. rate 16, height 5\' 4"  (1.626 m), weight 70.3 kg (155 lb), SpO2 98 %.  I/O:   Intake/Output Summary (Last 24 hours) at 05/03/16 1531 Last data filed at 05/03/16 1338  Gross per 24 hour   Intake              720 ml  Output              675 ml  Net               45 ml    PHYSICAL EXAMINATION:  GENERAL:  76 y.o.-year-old patient lying in the bed with no acute distress.  EYES: Pupils equal, round, reactive to light and accommodation. No scleral icterus. Extraocular muscles intact.  HEENT: Head atraumatic, normocephalic. Oropharynx and nasopharynx clear.  NECK:  Supple, no jugular venous distention. No thyroid enlargement, no tenderness.  LUNGS: Normal breath sounds bilaterally, no wheezing, rales,rhonchi or crepitation. No use of accessory muscles of respiration.  CARDIOVASCULAR: S1, S2 normal. No murmurs, rubs, or gallops.  ABDOMEN: Soft, non-tender, non-distended. Bowel sounds present. No organomegaly or mass.  EXTREMITIES: 2-3+ lower extremity and pedal edema, cyanosis, or clubbing.  NEUROLOGIC: Cranial nerves II through XII  are intact. Muscle strength 5/5 in all extremities. Sensation intact. Gait not checked.  PSYCHIATRIC: The patient is alert and oriented x 3.  SKIN: No obvious rash, lesion, or ulcer.   DATA REVIEW:   CBC  Recent Labs Lab 05/03/16 0341  WBC 6.8  HGB 9.4*  HCT 28.7*  PLT 234    Chemistries   Recent Labs Lab 05/03/16 0341  NA 144  K 3.9  CL 111  CO2 28  GLUCOSE 133*  BUN 24*  CREATININE 1.21*  CALCIUM 8.3*    Cardiac Enzymes  Recent Labs Lab 05/03/16 0055  TROPONINI 0.25*    Microbiology Results  Results for orders placed or performed during the hospital encounter of 02/27/16  MRSA PCR Screening     Status: None   Collection Time: 02/28/16  2:21 AM  Result Value Ref Range Status   MRSA by PCR NEGATIVE NEGATIVE Final    Comment:        The GeneXpert MRSA Assay (FDA approved for NASAL specimens only), is one component of a comprehensive MRSA colonization surveillance program. It is not intended to diagnose MRSA infection nor to guide or monitor treatment for MRSA infections.     RADIOLOGY:  Dg Chest 2  View  Result Date: 05/02/2016 CLINICAL DATA:  Chest pain EXAM: CHEST  2 VIEW COMPARISON:  April 18, 2016 FINDINGS: There is no edema or consolidation. Heart is mildly enlarged with pulmonary vascularity within normal limits. No adenopathy. Patient is status post coronary artery bypass grafting. No adenopathy. There is atherosclerotic calcification aorta. No bone lesions. IMPRESSION: No edema or consolidation. Stable cardiomegaly. There is aortic atherosclerosis. Electronically Signed   By: Bretta Bang III M.D.   On: 05/02/2016 12:50    EKG:   Orders placed or performed during the hospital encounter of 05/02/16  . ED EKG within 10 minutes  . ED EKG within 10 minutes      Management plans discussed with the patient, family and they are in agreement.  CODE STATUS:     Code Status Orders        Start     Ordered   05/02/16 1656  Full code  Continuous     05/02/16 1656    Code Status History    Date Active Date Inactive Code Status Order ID Comments User Context   03/02/2016  3:34 PM 03/05/2016  4:16 PM Full Code [id number]  Renford Dills, MD Inpatient   02/28/2016  2:43 AM 03/02/2016  3:33 PM Full Code [id number]  Oralia Manis, MD Inpatient   02/06/2016  7:17 PM 02/10/2016 11:14 PM Full Code [id number]  Wyatt Haste, MD ED   01/24/2016  2:00 PM 01/25/2016  7:15 PM Full Code [id number]  Auburn Bilberry, MD Inpatient   11/25/2015  3:40 PM 11/26/2015  3:49 PM Full Code [id number]  Houston Siren, MD Inpatient   11/05/2015 12:33 AM 11/06/2015  6:05 PM Full Code [id number]  Oralia Manis, MD Inpatient   09/13/2015 10:10 AM 09/15/2015  7:33 PM Full Code [id number]  Altamese Dilling, MD Inpatient      TOTAL TIME TAKING CARE OF THIS PATIENT: 40 minutes.    Katharina Caper M.D on 05/03/2016 at 3:31 PM  Between 7am to 6pm - Pager - [phone]  After 6pm go to [email or web address] - password [email or web address] [facility]  [place]  Office  [phone]  CC: Primary care physician;  Leanna Sato, MD

## 2016-05-03 NOTE — Care Management Obs Status (Signed)
MEDICARE OBSERVATION STATUS NOTIFICATION   Patient Details  Name: Robin [name] MRN: [id number] Date of Birth: 09-04-1940   Medicare Observation Status Notification Given:  Yes    Eber Hong, RN 05/03/2016, 4:46 PM

## 2016-05-03 NOTE — Progress Notes (Signed)
Xarelto changed to 15 mg daily for TBW CrCl 44 mL/min and atrial fibrillation indication, per MED 710-02.

## 2016-05-03 NOTE — Care Management (Signed)
Patient  well known to this CM.  She is for discharge and physical therapy is recommending home health.  Patient admits that she does not have any trouble leaving her home.  She is not homebound.  Leaves home 3 -5 times weekly to baby sit her grand child.  She admits she has no problems leaving her residence.  Contacted karen with wound care center and she will place referral for patient to have unna boots changes.  CM faxed information.  Updated attending.  Contacted DSS to attempt to obtain outcome of the APS report that was made in December.  Patient says the only thing she needs is for someone to bring her supplies to the home.

## 2016-05-03 NOTE — Progress Notes (Signed)
Patient requested for nursing to get her purse from a cabinet.  She began looking in her purse for a Advertising account planner.  Nursing noticed as she was taking items out of her purse, that she also had medications that were in her purse. One inhaler was found, a bottle of "tylenol" and a bottle of "nitroglycerin" which was noted to have multiple different looking pills inside (none of them appeared to actually be nitroglycerin).  Nursing packaged up medications according to hospital policy (in front of patient) and sent them down to pharmacy.  At that same time, the patient started looking through her wallet and stated that she was missing $100 bill.  She continued to look through her purse to make sure it wasn't misplaced.    Security was notified of "event" and came to talk to patient.  When questioned by security officer, patient replied that she was missing $60 or maybe $80.  She was unsure.  Security told patient that unless she witnessed someone taking her money, there was really nothing he could do.  She said she understood and didn't want any further action.

## 2016-05-03 NOTE — Evaluation (Signed)
Physical Therapy Evaluation Patient Details Name: Robin [name] MRN: [id number] DOB: November 28, 1940 Today's Date: 05/03/2016   History of Present Illness  pt is a 76 yo female, admitted to hospital w/ chest pain and palpitations. PMH includes; A-fib, asthma, CHF, Diabetes, HTN, HLD, Aortic valve stenosis, CKD, LE edema    Clinical Impression  Pt awake and alert and willing to participate in PT session. She demonstrated decreased safety awareness and was impulsive throughout and required frequent cuing to maintain safety. Pt's overall strength appears WFLs for tasks assessed and displayed some unsteadiness in stance and ambulation that improved w/ use of Rw. Pt able to ambulate w/ RW under PT supervision around nursing station w/o signs of chest pain or labored breathing. Discussed importance of using a RW at home to improve balance and prevent falls; patient disagrees. Pt presents w/ decreased balance and safety awareness and history of cardiopulmonary issues that limit safe functional mobility. She will benefit from skilled PT to correct deficits and recommend HHPT following hospital stay.     Follow Up Recommendations Home health PT    Equipment Recommendations  Rolling walker with 5" wheels    Recommendations for Other Services       Precautions / Restrictions Precautions Precautions: Fall Restrictions Weight Bearing Restrictions: No      Mobility  Bed Mobility Overal bed mobility: Independent                Transfers Overall transfer level: Modified independent Equipment used: Rolling walker (2 wheeled)             General transfer comment: cuing for safety when performing transfers, impulsive   Ambulation/Gait Ambulation/Gait assistance: Supervision Ambulation Distance (Feet): 200 Feet Assistive device: Rolling walker (2 wheeled) Gait Pattern/deviations: Step-through pattern;Trunk flexed     General Gait Details: cuing for safe positioning within the RW,  impulsive when ambulating at first and requires cuing to slow down to safe controlable speed, attempted ambulating w/o RW and patient displayed swaying and less stable but no LOB   Stairs            Wheelchair Mobility    Modified Rankin (Stroke Patients Only)       Balance Overall balance assessment: Needs assistance Sitting-balance support: Feet supported;No upper extremity supported Sitting balance-Leahy Scale: Good     Standing balance support: Bilateral upper extremity supported Standing balance-Leahy Scale: Good Standing balance comment: RW required for additional stability                              Pertinent Vitals/Pain Pain Assessment: No/denies pain    Home Living Family/patient expects to be discharged to:: Private residence Living Arrangements: Children Available Help at Discharge: Available PRN/intermittently Type of Home: House Home Access: Level entry     Home Layout: One level Home Equipment: Environmental consultant - 4 wheels;Cane - quad      Prior Function Level of Independence: Independent with assistive device(s)         Comments: states she is independent in ADLs, has AD but does not use them, claims she fell once in the past 6 months      Hand Dominance   Dominant Hand: Right    Extremity/Trunk Assessment   Upper Extremity Assessment Upper Extremity Assessment: Overall WFL for tasks assessed    Lower Extremity Assessment Lower Extremity Assessment: Overall WFL for tasks assessed       Communication   Communication: No difficulties  Cognition Arousal/Alertness: Awake/alert Behavior During Therapy: Impulsive Overall Cognitive Status: No family/caregiver present to determine baseline cognitive functioning                      General Comments      Exercises     Assessment/Plan    PT Assessment Patient needs continued PT services  PT Problem List Decreased balance;Decreased safety awareness;Decreased knowledge of  use of DME       PT Treatment Interventions DME instruction;Gait training;Stair training;Functional mobility training;Balance training;Therapeutic exercise;Therapeutic activities;Patient/family education    PT Goals (Current goals can be found in the Care Plan section)  Acute Rehab PT Goals Patient Stated Goal: Return home PT Goal Formulation: With patient Time For Goal Achievement: 05/17/16 Potential to Achieve Goals: Good    Frequency Min 2X/week   Barriers to discharge        Co-evaluation               End of Session Equipment Utilized During Treatment: Gait belt Activity Tolerance: Patient tolerated treatment well Patient left: in bed;with nursing/sitter in room Nurse Communication: Mobility status PT Visit Diagnosis: Unsteadiness on feet (R26.81);History of falling (Z91.81)         Time: 6063-0160 PT Time Calculation (min) (ACUTE ONLY): 15 min   Charges:         PT G Codes:         Nyzir Dubois Student PT 05/03/2016, 3:10 PM

## 2016-05-03 NOTE — Consult Note (Signed)
[facility] Consultation Note  Patient ID: Robin [name], MRN: [id number], DOB/AGE: 76-Aug-1942 76 y.o. Admit date: 05/02/2016   Date of Consult: 05/03/2016 Primary Physician: Leanna Sato, MD Primary Cardiologist:Fath  Chief Complaint:  Chief Complaint  Patient presents with  . Chest Pain   Reason for Consult: acute systolic dysfunction heart failure  HPI: 76 y.o. female with the known history of aortic valve stenosis chronic atrial fibrillation diabetes with complication essential hypertension mixed hyperlipidemia coronary artery disease with chronic kidney disease having worsening shortness of breath weakness and fatigue with lower extremity edema consistent with acute systolic dysfunction heart failure. The patient was given intravenous Lasix with improvements of symptoms and also has had other treatment including heart rate control of atrial fibrillation. The patient has had a recent echocardiogram in the hospital yesterday showing severe global LV systolic dysfunction with ejection fraction of 25% and hypokinesis of all myocardial walls. In addition to that she does have mild to moderate aortic valve stenosis and moderate mitral and tricuspid regurgitation contributing to above symptoms. Chronic atrial fibrillation heart rate control is reasonable and improved with metoprolol 3 times per day. In addition to that the patient also has diltiazem for heart rate control with a goal heart rate between 60 and 90 bpm. She has been on Xarelto for further risk reduction in stroke with atrial fibrillation and has no bleeding complications at this time. The patient does have the no current evidence of myocardial infarction but some demand ischemia with an elevated troponin of 0.26. He feels weak but no evidence of chest pain or other worsening symptoms  Past Medical History:  Diagnosis Date  . Afib (HCC)   . Anxiety   . Asthma   . CHF (congestive heart failure) (HCC)   . Coronary  artery disease   . Diabetes mellitus without complication (HCC)   . Hyperlipidemia   . Hypertension       Surgical History:  Past Surgical History:  Procedure Laterality Date  . CORONARY ARTERY BYPASS GRAFT    . NO PAST SURGERIES    . PERIPHERAL VASCULAR CATHETERIZATION Left 03/02/2016   Procedure: Renal Intervention;  Surgeon: Renford Dills, MD;  Location: [facility] INVASIVE CV LAB;  Service: Cardiovascular;  Laterality: Left;     Home Meds: Prior to Admission medications   Medication Sig Start Date End Date Taking? Authorizing Provider  albuterol (ACCUNEB) 1.25 MG/3ML nebulizer solution Take 1.25 mg by nebulization every 4 (four) hours as needed for wheezing or shortness of breath.    Yes Historical Provider, MD  aspirin 81 MG chewable tablet Chew 81 mg by mouth daily. 04/13/16 05/13/16 Yes Historical Provider, MD  atorvastatin (LIPITOR) 80 MG tablet Take 1 tablet (80 mg total) by mouth daily at 6 PM. 03/05/16  Yes Enedina Finner, MD  diltiazem (CARDIZEM CD) 180 MG 24 hr capsule Take 1 capsule (180 mg total) by mouth daily. 03/05/16  Yes Enedina Finner, MD  furosemide (LASIX) 20 MG tablet Take 20 mg by mouth every morning.   Yes Historical Provider, MD  glipiZIDE (GLUCOTROL) 5 MG tablet Take 0.5 tablets (2.5 mg total) by mouth daily before breakfast. 03/06/16  Yes Enedina Finner, MD  lisinopril (PRINIVIL,ZESTRIL) 10 MG tablet Take 10 mg by mouth daily. 04/23/16 05/23/16 Yes Historical Provider, MD  meclizine (ANTIVERT) 25 MG tablet Take 1 tablet (25 mg total) by mouth 3 (three) times daily as needed for dizziness. 11/06/15  Yes Enid Baas, MD  metoprolol (LOPRESSOR) 50 MG tablet Take 1  tablet (50 mg total) by mouth 3 (three) times daily. 02/10/16  Yes Altamese Dilling, MD  mometasone-formoterol (DULERA) 200-5 MCG/ACT AERO Inhale 2 puffs into the lungs 2 (two) times daily. 03/05/16  Yes Enedina Finner, MD  nitroGLYCERIN (NITROSTAT) 0.4 MG SL tablet Place 1 tablet (0.4 mg total) under the tongue every  5 (five) minutes as needed for chest pain. 01/25/16  Yes Sital Mody, MD  ondansetron (ZOFRAN ODT) 4 MG disintegrating tablet Take 1 tablet (4 mg total) by mouth every 8 (eight) hours as needed for nausea or vomiting. 11/06/15  Yes Enid Baas, MD  pantoprazole (PROTONIX) 40 MG tablet Take 1 tablet (40 mg total) by mouth daily. Patient taking differently: Take 40 mg by mouth daily as needed. For acid reflux 11/06/15  Yes Enid Baas, MD  rivaroxaban (XARELTO) 20 MG TABS tablet Take 1 tablet (20 mg total) by mouth daily with supper. 03/03/16  Yes Altamese Dilling, MD  traMADol (ULTRAM) 50 MG tablet Take 1 tablet (50 mg total) by mouth every 12 (twelve) hours as needed for moderate pain or severe pain. 02/10/16  Yes Altamese Dilling, MD  oxyCODONE-acetaminophen (PERCOCET/ROXICET) 5-325 MG tablet Take 1 tablet by mouth every 4 (four) hours as needed for moderate pain or severe pain. 03/05/16   Enedina Finner, MD    Inpatient Medications:  . aspirin  81 mg Oral Daily  . atorvastatin  80 mg Oral q1800  . diltiazem  180 mg Oral Daily  . furosemide  20 mg Oral q morning - 10a  . glipiZIDE  2.5 mg Oral QAC breakfast  . hydrocortisone cream   Topical QID  . insulin aspart  0-5 Units Subcutaneous QHS  . insulin aspart  0-9 Units Subcutaneous TID WC  . lisinopril  10 mg Oral Daily  . metoprolol  50 mg Oral TID  . mometasone-formoterol  2 puff Inhalation BID  . pantoprazole  40 mg Oral Daily  . rivaroxaban  15 mg Oral Q supper  . sodium chloride flush  3 mL Intravenous Q12H     Allergies:  Allergies  Allergen Reactions  . Morphine And Related Other (See Comments)    Patient states it makes her "space out"    Social History   Social History  . Marital status: Divorced    Spouse name: N/A  . Number of children: N/A  . Years of education: N/A   Occupational History  . Not on file.   Social History Main Topics  . Smoking status: Never Smoker  . Smokeless tobacco: Never  Used  . Alcohol use No  . Drug use: No  . Sexual activity: No   Other Topics Concern  . Not on file   Social History Narrative  . No narrative on file     Family History  Problem Relation Age of Onset  . Hypertension Mother   . Heart failure Mother   . Heart disease Father      Review of Systems Positive forShortness of breath weakness and fatigue Negative for: General:  chills, fever, night sweats or weight changes.  Cardiovascular: PND orthopnea syncope dizziness  Dermatological skin lesions rashes Respiratory: Cough congestion Urologic: Frequent urination urination at night and hematuria Abdominal: negative for nausea, vomiting, diarrhea, bright red blood per rectum, melena, or hematemesis Neurologic: negative for visual changes, and/or hearing changes  All other systems reviewed and are otherwise negative except as noted above.  Labs:  Recent Labs  05/02/16 1221 05/02/16 1657 05/02/16 2118 05/03/16 0055  TROPONINI  0.26* 0.26* 0.27* 0.25*   Lab Results  Component Value Date   WBC 6.8 05/03/2016   HGB 9.4 (L) 05/03/2016   HCT 28.7 (L) 05/03/2016   MCV 80.0 05/03/2016   PLT 234 05/03/2016    Recent Labs Lab 05/03/16 0341  NA 144  K 3.9  CL 111  CO2 28  BUN 24*  CREATININE 1.21*  CALCIUM 8.3*  GLUCOSE 133*   Lab Results  Component Value Date   CHOL 229 (H) 02/11/2014   HDL 49 02/11/2014   LDLCALC 156 (H) 02/11/2014   TRIG 120 02/11/2014   No results found for: DDIMER  Radiology/Studies:  Dg Chest 2 View  Result Date: 05/02/2016 CLINICAL DATA:  Chest pain EXAM: CHEST  2 VIEW COMPARISON:  April 18, 2016 FINDINGS: There is no edema or consolidation. Heart is mildly enlarged with pulmonary vascularity within normal limits. No adenopathy. Patient is status post coronary artery bypass grafting. No adenopathy. There is atherosclerotic calcification aorta. No bone lesions. IMPRESSION: No edema or consolidation. Stable cardiomegaly. There is aortic  atherosclerosis. Electronically Signed   By: Bretta Bang III M.D.   On: 05/02/2016 12:50   Dg Chest Port 1 View  Result Date: 04/18/2016 CLINICAL DATA:  Chest pain beginning this morning. EXAM: PORTABLE CHEST 1 VIEW COMPARISON:  CT chest and single view of the chest 03/27/2016. FINDINGS: There is cardiomegaly without edema. The patient is status post CABG. No pneumothorax. Mild left basilar atelectasis is seen. IMPRESSION: Cardiomegaly without edema.  No acute abnormality. Electronically Signed   By: Drusilla Kanner M.D.   On: 04/18/2016 10:27    NIO:EVOJJK fibrillation with rapid ventricular rate  Weights: Filed Weights   05/02/16 1212  Weight: 70.3 kg (155 lb)     Physical Exam: Blood pressure (!) 144/79, pulse (!) 54, temperature 98.1 F (36.7 C), temperature source Oral, resp. rate 16, height 5\' 4"  (1.626 m), weight 70.3 kg (155 lb), SpO2 98 %. Body mass index is 26.61 kg/m. General: Well developed, well nourished, in no acute distress. Head eyes ears nose throat: Normocephalic, atraumatic, sclera non-icteric, no xanthomas, nares are without discharge. No apparent thyromegaly and/or mass  Lungs: Normal respiratory effort.  no wheezes,Basilar rales, no rhonchi.  Heart: Irregular with normal S1 S2.2-3+ aortic murmur gallop, no rub, PMI is normal size and placement, carotid upstroke normal with bruit, jugular venous pressure is normal Abdomen: Soft, non-tender, non-distended with normoactive bowel sounds. No hepatomegaly. No rebound/guarding. No obvious abdominal masses. Abdominal aorta is normal size without bruit Extremities: 1+ edema. no cyanosis, no clubbing, no ulcers  Peripheral : 2+ bilateral upper extremity pulses, 2+ bilateral femoral pulses, 2+ bilateral dorsal pedal pulse Neuro: Alert and oriented. No facial asymmetry. No focal deficit. Moves all extremities spontaneously. Musculoskeletal: Normal muscle tone without kyphosis Psych:  Responds to questions appropriately  with a normal affect.    Assessment: 76 year old female with acute systolic dysfunction congestive heart failure multifactorial in nature with pulmonary edema and atrial fibrillation with rapid ventricular rate with elevated troponin consistent with demand ischemia rather than acute coronary syndrome and diabetes with complications hyperlipidemia and chronic kidney disease stage III  Plan: 1. Continue furosemide for lower extremity edema pulmonary edema and acute systolic dysfunction heart failure watching closely for worsening chronic kidney disease 2. He needs Xarelto at current dosage for further risk reduction in stroke with atrial fibrillation 3. No change in diltiazem and metoprolol combination for heart rate control of atrial fibrillation with a goal heart rate between 60  and 90 bpm at rest 4. High intensity cholesterol therapy with atorvastatin for coronary artery disease 5. No further intervention of minimal elevation of troponin more consistent with demand ischemia rather than acute coronary syndrome 6. Diet and exercise program and rehabilitation have been discussed today to improve outcomes 7. No further intervention of the aortic valve disease and mitral and tricuspid valve disease stable  Signed, Lamar Blinks M.D. [facility] Card[facility] 05/03/2016, 12:01 PM

## 2016-05-03 NOTE — Consult Note (Signed)
WOC Nurse wound consult note Reason for Consult:Paged by RN who needed Unnas boots applied and patient was being discharged in an hour.  Stated that WOC nurse was in [place] and could not do that.  Explained to wrap zinc layer frombelow  toes to below knee and secure self adherent COban, wrapped in the same fashion.  50% overlap, being sure to enclose the heel.  Rn verbalized understanding.  Wound type:None Will not follow at this time.  Please re-consult if needed.  Maple Hudson RN BSN CWON Pager [phone]

## 2016-05-03 NOTE — Progress Notes (Signed)
Patient walked around the nursing station.  02 saturation remained in the 90s.  No complaints of pain.  Afib on monitor.  Wrapped legs in unna boots per wound care instructions.  Patient asking if she will be discharged.  Dr. Zorita Pang paged.

## 2016-05-05 ENCOUNTER — Emergency Department
Admission: EM | Admit: 2016-05-05 | Discharge: 2016-05-05 | Disposition: A | Discharge status: Home / Self Care | Payer: Medicare Other | Attending: Emergency Medicine | Admitting: Emergency Medicine

## 2016-05-05 ENCOUNTER — Encounter: Payer: Self-pay | Admitting: Emergency Medicine

## 2016-05-05 ENCOUNTER — Emergency Department: Payer: Medicare Other

## 2016-05-05 DIAGNOSIS — Z7982 Long term (current) use of aspirin: Secondary | ICD-10-CM | POA: Diagnosis not present

## 2016-05-05 DIAGNOSIS — I110 Hypertensive heart disease with heart failure: Secondary | ICD-10-CM | POA: Diagnosis not present

## 2016-05-05 DIAGNOSIS — I509 Heart failure, unspecified: Secondary | ICD-10-CM | POA: Diagnosis not present

## 2016-05-05 DIAGNOSIS — Z7984 Long term (current) use of oral hypoglycemic drugs: Secondary | ICD-10-CM | POA: Insufficient documentation

## 2016-05-05 DIAGNOSIS — R0602 Shortness of breath: Secondary | ICD-10-CM | POA: Diagnosis present

## 2016-05-05 DIAGNOSIS — E119 Type 2 diabetes mellitus without complications: Secondary | ICD-10-CM | POA: Diagnosis not present

## 2016-05-05 DIAGNOSIS — R0600 Dyspnea, unspecified: Secondary | ICD-10-CM

## 2016-05-05 DIAGNOSIS — J45909 Unspecified asthma, uncomplicated: Secondary | ICD-10-CM | POA: Insufficient documentation

## 2016-05-05 LAB — BASIC METABOLIC PANEL
Anion gap: 5 (ref 5–15)
BUN: 28 mg/dL — AB (ref 6–20)
CALCIUM: 8.9 mg/dL (ref 8.9–10.3)
CO2: 27 mmol/L (ref 22–32)
CREATININE: 1.22 mg/dL — AB (ref 0.44–1.00)
Chloride: 111 mmol/L (ref 101–111)
GFR calc Af Amer: 49 mL/min — ABNORMAL LOW (ref >60)
GFR, EST NON AFRICAN AMERICAN: 42 mL/min — AB (ref >60)
Glucose, Bld: 162 mg/dL — ABNORMAL HIGH (ref 65–99)
Potassium: 4.3 mmol/L (ref 3.5–5.1)
Sodium: 143 mmol/L (ref 135–145)

## 2016-05-05 LAB — CBC
HEMATOCRIT: 29.8 % — AB (ref 35.0–47.0)
HEMOGLOBIN: 9.8 g/dL — AB (ref 12.0–16.0)
MCH: 26.7 pg (ref 26.0–34.0)
MCHC: 32.9 g/dL (ref 32.0–36.0)
MCV: 81.2 fL (ref 80.0–100.0)
Platelets: 289 10*3/uL (ref 150–440)
RBC: 3.67 MIL/uL — ABNORMAL LOW (ref 3.80–5.20)
RDW: 16 % — AB (ref 11.5–14.5)
WBC: 8.1 10*3/uL (ref 3.6–11.0)

## 2016-05-05 LAB — TROPONIN I: Troponin I: 0.08 ng/mL (ref <0.03)

## 2016-05-05 MED ORDER — LORAZEPAM 1 MG PO TABS: 1.0000 mg | ORAL_TABLET | Freq: Two times a day (BID) | ORAL | 0 refills | Status: DC

## 2016-05-05 NOTE — ED Triage Notes (Signed)
Patient from home via POV. Patient initially states that she came to the ED today for "problems breathing". Patient states that breathing problems started yesterday. Denies cough, denies congestion. During the course of the triage, patient states she has had weakness for 2 months ever since "they drained me with lasix". She continues by saying that she now remembers that she had chest pain this morning, center of her chest. Patient is now on her cellphone and this RN is unable to further evaluate triage.

## 2016-05-05 NOTE — ED Notes (Signed)
Patient refuses to be hooked up to monitor until she sees the doctor

## 2016-05-05 NOTE — ED Provider Notes (Addendum)
[facility] Emergency Department Provider Note        Time seen: ----------------------------------------- 3:35 PM on 05/05/2016 -----------------------------------------    I have reviewed the triage vital signs and the nursing notes.   HISTORY  Chief Complaint No chief complaint on file.    HPI Robin [name] is a 76 y.o. female who presents to ER for shortness of breath. Patient states she was having trouble breathing while she was sleeping. States she started having trouble breathing starting yesterday. She denies cough or congestion. She does not she's had weakness for the past 2 months since they drained her with Lasix. She thinks she may have had chest pain this morning. Nothing makes her symptoms better or worse.   Past Medical History:  Diagnosis Date  . Afib (HCC)   . Anxiety   . Asthma   . CHF (congestive heart failure) (HCC)   . Coronary artery disease   . Diabetes mellitus without complication (HCC)   . Hyperlipidemia   . Hypertension     Patient Active Problem List   Diagnosis Date Noted  . Atrial fibrillation with RVR (HCC) 05/03/2016  . Demand ischemia (HCC) 05/03/2016  . Moderate aortic stenosis 05/03/2016  . Moderate mitral regurgitation 05/03/2016  . History of noncompliance with medical treatment 05/03/2016  . Congestive dilated cardiomyopathy (HCC) 05/03/2016  . Chest pain 05/02/2016  . 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 Renal artery stenosis (HCC) 02/28/2016  . Accelerated hypertension 02/28/2016  . Mild dementia 02/28/2016  . Noncompliance 02/07/2016  . Renal infarct (HCC) 02/06/2016  . Atrial fibrillation with rapid ventricular response (HCC) 02/06/2016  . A-fib (HCC) 01/24/2016  . HTN (hypertension) 11/04/2015  . HLD (hyperlipidemia) 11/04/2015  . CAD (coronary artery disease) 11/04/2015  . Chronic systolic CHF (congestive heart failure) (HCC) 11/04/2015  . Diabetes (HCC) 11/04/2015    Past Surgical  History:  Procedure Laterality Date  . CORONARY ARTERY BYPASS GRAFT    . NO PAST SURGERIES    . PERIPHERAL VASCULAR CATHETERIZATION Left 03/02/2016   Procedure: Renal Intervention;  Surgeon: Renford Dills, MD;  Location: [facility] INVASIVE CV LAB;  Service: Cardiovascular;  Laterality: Left;    Allergies Morphine and related  Social History Social History  Substance Use Topics  . Smoking status: Never Smoker  . Smokeless tobacco: Never Used  . Alcohol use No    Review of Systems Constitutional: Negative for fever. Cardiovascular: Negative for chest pain. Respiratory: Positive for shortness of breath Gastrointestinal: Negative for abdominal pain, vomiting and diarrhea. Genitourinary: Negative for dysuria. Musculoskeletal: Negative for back pain. Skin: Negative for rash. Neurological: Negative for headaches, focal weakness or numbness.  10-point ROS otherwise negative.  ____________________________________________   PHYSICAL EXAM:  VITAL SIGNS: ED Triage Vitals  Enc Vitals Group     BP 05/05/16 1442 (!) 198/62     Pulse Rate 05/05/16 1442 69     Resp 05/05/16 1442 18     Temp 05/05/16 1442 98.8 F (37.1 C)     Temp Source 05/05/16 1442 Oral     SpO2 05/05/16 1442 98 %     Weight 05/05/16 1443 155 lb (70.3 kg)     Height 05/05/16 1443 5\' 4"  (1.626 m)     Head Circumference --      Peak Flow --      Pain Score 05/05/16 1443 0     Pain Loc --      Pain Edu? --      Excl. in  GC? --     Constitutional: Alert and oriented. Well appearing and in no distress. Eyes: Conjunctivae are normal. PERRL. Normal extraocular movements. ENT   Head: Normocephalic and atraumatic.   Nose: No congestion/rhinnorhea.   Mouth/Throat: Mucous membranes are moist.   Neck: No stridor. Cardiovascular: Normal rate, regular rhythm. No murmurs, rubs, or gallops. Respiratory: Normal respiratory effort without tachypnea nor retractions. Breath sounds are clear and equal  bilaterally. No wheezes/rales/rhonchi. Gastrointestinal: Soft and nontender. Normal bowel sounds Musculoskeletal: Nontender with normal range of motion in all extremities. No lower extremity tenderness nor edema. Neurologic:  Normal speech and language. No gross focal neurologic deficits are appreciated.  Skin:  Skin is warm, dry and intact. No rash noted. Psychiatric: Mood and affect are normal. Speech and behavior are normal.  ____________________________________________  EKG: Interpreted by me.Sinus rhythm with a rate of 60 bpm, left axis deviation, LVH with repolarization abnormality, septal infarct, normal QT interval  ____________________________________________  ED COURSE:  Pertinent labs & imaging results that were available during my care of the patient were reviewed by me and considered in my medical decision making (see chart for details). Patient presents to ER with shortness of breath and is well-known to me. I admitted her 3 days ago. We will recheck her labs and imaging.   Procedures ____________________________________________   LABS (pertinent positives/negatives)  Labs Reviewed  BASIC METABOLIC PANEL - Abnormal; Notable for the following:       Result Value   Glucose, Bld 162 (*)    BUN 28 (*)    Creatinine, Ser 1.22 (*)    GFR calc non Af Amer 42 (*)    GFR calc Af Amer 49 (*)    All other components within normal limits  CBC - Abnormal; Notable for the following:    RBC 3.67 (*)    Hemoglobin 9.8 (*)    HCT 29.8 (*)    RDW 16.0 (*)    All other components within normal limits  TROPONIN I - Abnormal; Notable for the following:    Troponin I 0.08 (*)    All other components within normal limits    RADIOLOGY  Chest x-ray Is unremarkable ____________________________________________  FINAL ASSESSMENT AND PLAN  Dyspnea  Plan: Patient with labs and imaging as dictated above. Patient presented to the ER with dyspnea of uncertain etiology. Troponin is  trending downward after recently being elevated. She denies complaints this time and is requesting to leave. She is stable for outpatient follow-up.    Emily Filbert, MD   Note: This note was generated in part or whole with voice recognition software. Voice recognition is usually quite accurate but there are transcription errors that can and very often do occur. I apologize for any typographical errors that were not detected and corrected.     Emily Filbert, MD 05/05/16 1559    Emily Filbert, MD 05/05/16 [phone]

## 2016-05-08 ENCOUNTER — Ambulatory Visit: Payer: Medicare Other | Admitting: Internal Medicine

## 2016-05-25 ENCOUNTER — Emergency Department
Admission: EM | Admit: 2016-05-25 | Discharge: 2016-05-25 | Disposition: A | Discharge status: Home / Self Care | Payer: Medicare Other | Attending: Emergency Medicine | Admitting: Emergency Medicine

## 2016-05-25 ENCOUNTER — Encounter: Payer: Self-pay | Admitting: Emergency Medicine

## 2016-05-25 DIAGNOSIS — I2510 Atherosclerotic heart disease of native coronary artery without angina pectoris: Secondary | ICD-10-CM | POA: Insufficient documentation

## 2016-05-25 DIAGNOSIS — I482 Chronic atrial fibrillation, unspecified: Secondary | ICD-10-CM

## 2016-05-25 DIAGNOSIS — R51 Headache: Secondary | ICD-10-CM | POA: Diagnosis present

## 2016-05-25 DIAGNOSIS — Z7984 Long term (current) use of oral hypoglycemic drugs: Secondary | ICD-10-CM | POA: Insufficient documentation

## 2016-05-25 DIAGNOSIS — I110 Hypertensive heart disease with heart failure: Secondary | ICD-10-CM | POA: Diagnosis not present

## 2016-05-25 DIAGNOSIS — J45909 Unspecified asthma, uncomplicated: Secondary | ICD-10-CM | POA: Diagnosis not present

## 2016-05-25 DIAGNOSIS — Z760 Encounter for issue of repeat prescription: Secondary | ICD-10-CM | POA: Diagnosis not present

## 2016-05-25 DIAGNOSIS — I5022 Chronic systolic (congestive) heart failure: Secondary | ICD-10-CM | POA: Diagnosis not present

## 2016-05-25 DIAGNOSIS — Z79899 Other long term (current) drug therapy: Secondary | ICD-10-CM | POA: Insufficient documentation

## 2016-05-25 DIAGNOSIS — E119 Type 2 diabetes mellitus without complications: Secondary | ICD-10-CM | POA: Diagnosis not present

## 2016-05-25 DIAGNOSIS — Z951 Presence of aortocoronary bypass graft: Secondary | ICD-10-CM | POA: Insufficient documentation

## 2016-05-25 DIAGNOSIS — I10 Essential (primary) hypertension: Secondary | ICD-10-CM

## 2016-05-25 LAB — BASIC METABOLIC PANEL
ANION GAP: 5 (ref 5–15)
BUN: 25 mg/dL — AB (ref 6–20)
CHLORIDE: 109 mmol/L (ref 101–111)
CO2: 26 mmol/L (ref 22–32)
Calcium: 8.9 mg/dL (ref 8.9–10.3)
Creatinine, Ser: 1.12 mg/dL — ABNORMAL HIGH (ref 0.44–1.00)
GFR calc Af Amer: 54 mL/min — ABNORMAL LOW (ref >60)
GFR calc non Af Amer: 47 mL/min — ABNORMAL LOW (ref >60)
Glucose, Bld: 144 mg/dL — ABNORMAL HIGH (ref 65–99)
POTASSIUM: 4.6 mmol/L (ref 3.5–5.1)
Sodium: 140 mmol/L (ref 135–145)

## 2016-05-25 LAB — CBC
HCT: 33 % — ABNORMAL LOW (ref 35.0–47.0)
HEMOGLOBIN: 10.6 g/dL — AB (ref 12.0–16.0)
MCH: 25.7 pg — AB (ref 26.0–34.0)
MCHC: 32.2 g/dL (ref 32.0–36.0)
MCV: 79.7 fL — ABNORMAL LOW (ref 80.0–100.0)
Platelets: 297 10*3/uL (ref 150–440)
RBC: 4.13 MIL/uL (ref 3.80–5.20)
RDW: 16.5 % — ABNORMAL HIGH (ref 11.5–14.5)
WBC: 9.9 10*3/uL (ref 3.6–11.0)

## 2016-05-25 LAB — TROPONIN I: TROPONIN I: 0.03 ng/mL — AB (ref <0.03)

## 2016-05-25 MED ORDER — METOPROLOL TARTRATE 50 MG PO TABS: 50.0000 mg | ORAL_TABLET | Freq: Three times a day (TID) | ORAL | 0 refills | Status: DC

## 2016-05-25 MED ORDER — METOPROLOL TARTRATE 50 MG PO TABS
50.0000 mg | ORAL_TABLET | Freq: Once | ORAL | Status: AC
  Administered 2016-05-25: 50 mg via ORAL
  Filled 2016-05-25: qty 1

## 2016-05-25 NOTE — ED Triage Notes (Signed)
Per [facility], patient comes from home with c/o HTN and HA. Hx of a fib. Non complaint with meds. Patient states she does not drive and her family who normally picks up her medications has not been able to pick them up. HR in triage is 120. Patient denies CP or SOB.

## 2016-05-25 NOTE — ED Provider Notes (Addendum)
[facility] Emergency Department Provider Note        Time seen: ----------------------------------------- 2:46 PM on 05/25/2016 -----------------------------------------    I have reviewed the triage vital signs and the nursing notes.   HISTORY  Chief Complaint Hypertension    HPI Robin [name] is a 76 y.o. female who presents to the ER for multiple complaints. Patient comes from home complaining of hypertension and headache. She does have history of a 2 fibrillation and reportedly has a history of medication noncompliance. She is well-known to me, reports she's been out of her metoprolol that she takes 3 times a day and she has missed the last 4 doses. Patient states she does not drive and her family has not been able to pick up her medication. Heart rate here is in the 120s. She denies any other complaints this time.   Past Medical History:  Diagnosis Date  . Afib (HCC)   . Anxiety   . Asthma   . CHF (congestive heart failure) (HCC)   . Coronary artery disease   . Diabetes mellitus without complication (HCC)   . Hyperlipidemia   . Hypertension     Patient Active Problem List   Diagnosis Date Noted  . Atrial fibrillation with RVR (HCC) 05/03/2016  . Demand ischemia (HCC) 05/03/2016  . Moderate aortic stenosis 05/03/2016  . Moderate mitral regurgitation 05/03/2016  . History of noncompliance with medical treatment 05/03/2016  . Congestive dilated cardiomyopathy (HCC) 05/03/2016  . Chest pain 05/02/2016  . 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 Renal artery stenosis (HCC) 02/28/2016  . Accelerated hypertension 02/28/2016  . Mild dementia 02/28/2016  . Noncompliance 02/07/2016  . Renal infarct (HCC) 02/06/2016  . Atrial fibrillation with rapid ventricular response (HCC) 02/06/2016  . A-fib (HCC) 01/24/2016  . HTN (hypertension) 11/04/2015  . HLD (hyperlipidemia) 11/04/2015  . CAD (coronary artery disease) 11/04/2015  . Chronic  systolic CHF (congestive heart failure) (HCC) 11/04/2015  . Diabetes (HCC) 11/04/2015    Past Surgical History:  Procedure Laterality Date  . CORONARY ARTERY BYPASS GRAFT    . NO PAST SURGERIES    . PERIPHERAL VASCULAR CATHETERIZATION Left 03/02/2016   Procedure: Renal Intervention;  Surgeon: Renford Dills, MD;  Location: [facility] INVASIVE CV LAB;  Service: Cardiovascular;  Laterality: Left;    Allergies Morphine and related  Social History Social History  Substance Use Topics  . Smoking status: Never Smoker  . Smokeless tobacco: Never Used  . Alcohol use No    Review of Systems Constitutional: Negative for fever. Cardiovascular: Negative for chest pain. Respiratory: Negative for shortness of breath. Gastrointestinal: Negative for abdominal pain, vomiting and diarrhea. Skin: Negative for rash. Neurological: Negative for headaches, positive for weakness  10-point ROS otherwise negative.  ____________________________________________   PHYSICAL EXAM:  VITAL SIGNS: ED Triage Vitals [05/25/16 1238]  Enc Vitals Group     BP (!) 197/102     Pulse Rate (!) 120     Resp 17     Temp 98.1 F (36.7 C)     Temp Source Oral     SpO2 98 %     Weight 155 lb (70.3 kg)     Height 5\' 4"  (1.626 m)     Head Circumference      Peak Flow      Pain Score 0     Pain Loc      Pain Edu?      Excl. in GC?     Constitutional: Alert  and oriented. Anxious, no distress Eyes: Conjunctivae are normal. PERRL. Normal extraocular movements. ENT   Head: Normocephalic and atraumatic.   Nose: No congestion/rhinnorhea.   Mouth/Throat: Mucous membranes are moist.   Neck: No stridor. Cardiovascular: Rapid rate, irregular rhythm. No murmurs, rubs, or gallops. Respiratory: Normal respiratory effort without tachypnea nor retractions. Breath sounds are clear and equal bilaterally. No wheezes/rales/rhonchi. Gastrointestinal: Soft and nontender. Normal bowel sounds Musculoskeletal:  Nontender with normal range of motion in all extremities. Bilateral lower extremity edema Neurologic:  Normal speech and language. No gross focal neurologic deficits are appreciated.  Skin:  Skin is warm, dry and intact. No rash noted. Psychiatric: Mood and affect are normal. Speech and behavior are normal.  ____________________________________________  EKG: Interpreted by me. Atrial fibrillation with a rapid ventricular response, rate is 125 bpm, left axis deviation, septal infarct  ____________________________________________  ED COURSE:  Pertinent labs & imaging results that were available during my care of the patient were reviewed by me and considered in my medical decision making (see chart for details). Patient presents to ER with medication noncompliance and concerns about her blood pressure and heart rate that are controlled by the medication she has been missed.    Procedures ____________________________________________   LABS (pertinent positives/negatives)  Labs Reviewed  CBC - Abnormal; Notable for the following:       Result Value   Hemoglobin 10.6 (*)    HCT 33.0 (*)    MCV 79.7 (*)    MCH 25.7 (*)    RDW 16.5 (*)    All other components within normal limits  BASIC METABOLIC PANEL - Abnormal; Notable for the following:    Glucose, Bld 144 (*)    BUN 25 (*)    Creatinine, Ser 1.12 (*)    GFR calc non Af Amer 47 (*)    GFR calc Af Amer 54 (*)    All other components within normal limits  TROPONIN I - Abnormal; Notable for the following:    Troponin I 0.03 (*)    All other components within normal limits   ____________________________________________  FINAL ASSESSMENT AND PLAN  Atrial fibrillation, hypertension, prescription refill  Plan: Patient with labs as dictated above. Patient presents to the ER for hypertension, A. fib and a medication refill. We have restart her metoprolol. Heart rate is improving. Overall she appears stable for outpatient  follow-up.   Emily Filbert, MD   Note: This note was generated in part or whole with voice recognition software. Voice recognition is usually quite accurate but there are transcription errors that can and very often do occur. I apologize for any typographical errors that were not detected and corrected.     Emily Filbert, MD 05/25/16 1450    Emily Filbert, MD 05/25/16 1524    Emily Filbert, MD 05/25/16 [phone]

## 2016-06-02 ENCOUNTER — Emergency Department: Payer: Medicare Other

## 2016-06-02 ENCOUNTER — Other Ambulatory Visit: Payer: Self-pay

## 2016-06-02 ENCOUNTER — Emergency Department
Admission: EM | Admit: 2016-06-02 | Discharge: 2016-06-02 | Disposition: A | Discharge status: Home / Self Care | Payer: Medicare Other | Attending: Emergency Medicine | Admitting: Emergency Medicine

## 2016-06-02 DIAGNOSIS — I2510 Atherosclerotic heart disease of native coronary artery without angina pectoris: Secondary | ICD-10-CM | POA: Diagnosis not present

## 2016-06-02 DIAGNOSIS — I110 Hypertensive heart disease with heart failure: Secondary | ICD-10-CM | POA: Insufficient documentation

## 2016-06-02 DIAGNOSIS — E119 Type 2 diabetes mellitus without complications: Secondary | ICD-10-CM | POA: Diagnosis not present

## 2016-06-02 DIAGNOSIS — J45909 Unspecified asthma, uncomplicated: Secondary | ICD-10-CM | POA: Diagnosis not present

## 2016-06-02 DIAGNOSIS — Z7984 Long term (current) use of oral hypoglycemic drugs: Secondary | ICD-10-CM | POA: Insufficient documentation

## 2016-06-02 DIAGNOSIS — Z79899 Other long term (current) drug therapy: Secondary | ICD-10-CM | POA: Diagnosis not present

## 2016-06-02 DIAGNOSIS — I480 Paroxysmal atrial fibrillation: Secondary | ICD-10-CM | POA: Insufficient documentation

## 2016-06-02 DIAGNOSIS — Z9114 Patient's other noncompliance with medication regimen: Secondary | ICD-10-CM

## 2016-06-02 DIAGNOSIS — Z9119 Patient's noncompliance with other medical treatment and regimen: Secondary | ICD-10-CM | POA: Insufficient documentation

## 2016-06-02 DIAGNOSIS — M79605 Pain in left leg: Secondary | ICD-10-CM | POA: Diagnosis present

## 2016-06-02 DIAGNOSIS — I509 Heart failure, unspecified: Secondary | ICD-10-CM

## 2016-06-02 DIAGNOSIS — I5022 Chronic systolic (congestive) heart failure: Secondary | ICD-10-CM | POA: Diagnosis not present

## 2016-06-02 DIAGNOSIS — I82812 Embolism and thrombosis of superficial veins of left lower extremities: Secondary | ICD-10-CM | POA: Diagnosis not present

## 2016-06-02 DIAGNOSIS — I4891 Unspecified atrial fibrillation: Secondary | ICD-10-CM

## 2016-06-02 LAB — CBC
HEMATOCRIT: 34.1 % — AB (ref 35.0–47.0)
Hemoglobin: 10.8 g/dL — ABNORMAL LOW (ref 12.0–16.0)
MCH: 25.1 pg — AB (ref 26.0–34.0)
MCHC: 31.7 g/dL — ABNORMAL LOW (ref 32.0–36.0)
MCV: 79.3 fL — AB (ref 80.0–100.0)
PLATELETS: 275 10*3/uL (ref 150–440)
RBC: 4.29 MIL/uL (ref 3.80–5.20)
RDW: 16.6 % — AB (ref 11.5–14.5)
WBC: 9.8 10*3/uL (ref 3.6–11.0)

## 2016-06-02 LAB — BASIC METABOLIC PANEL
Anion gap: 8 (ref 5–15)
BUN: 39 mg/dL — ABNORMAL HIGH (ref 6–20)
CALCIUM: 8.7 mg/dL — AB (ref 8.9–10.3)
CO2: 25 mmol/L (ref 22–32)
Chloride: 105 mmol/L (ref 101–111)
Creatinine, Ser: 1.44 mg/dL — ABNORMAL HIGH (ref 0.44–1.00)
GFR calc Af Amer: 40 mL/min — ABNORMAL LOW (ref >60)
GFR, EST NON AFRICAN AMERICAN: 35 mL/min — AB (ref >60)
GLUCOSE: 250 mg/dL — AB (ref 65–99)
POTASSIUM: 4.4 mmol/L (ref 3.5–5.1)
Sodium: 138 mmol/L (ref 135–145)

## 2016-06-02 LAB — BRAIN NATRIURETIC PEPTIDE: B Natriuretic Peptide: 2012 pg/mL — ABNORMAL HIGH (ref 0.0–100.0)

## 2016-06-02 LAB — TROPONIN I: Troponin I: 0.04 ng/mL (ref <0.03)

## 2016-06-02 MED ORDER — FUROSEMIDE 10 MG/ML IJ SOLN
40.0000 mg | Freq: Once | INTRAMUSCULAR | Status: AC
  Administered 2016-06-02: 40 mg via INTRAVENOUS
  Filled 2016-06-02: qty 4

## 2016-06-02 MED ORDER — METOPROLOL TARTRATE 50 MG PO TABS
50.0000 mg | ORAL_TABLET | Freq: Once | ORAL | Status: AC
  Administered 2016-06-02: 50 mg via ORAL
  Filled 2016-06-02: qty 1

## 2016-06-02 MED ORDER — METOPROLOL TARTRATE 50 MG PO TABS
50.0000 mg | ORAL_TABLET | Freq: Once | ORAL | Status: AC
  Administered 2016-06-02: 50 mg via ORAL
  Filled 2016-06-02 (×2): qty 1

## 2016-06-02 MED ORDER — DILTIAZEM HCL 25 MG/5ML IV SOLN: 10.0000 mg | Freq: Once | INTRAVENOUS | Status: DC

## 2016-06-02 MED ORDER — LISINOPRIL 10 MG PO TABS
20.0000 mg | ORAL_TABLET | Freq: Once | ORAL | Status: AC
  Administered 2016-06-02: 20 mg via ORAL
  Filled 2016-06-02 (×2): qty 2

## 2016-06-02 MED ORDER — ONDANSETRON 4 MG PO TBDP
ORAL_TABLET | ORAL | Status: AC
  Filled 2016-06-02: qty 1

## 2016-06-02 MED ORDER — RIVAROXABAN 15 MG PO TABS: 15.0000 mg | ORAL_TABLET | Freq: Every day | ORAL | 1 refills | Status: DC

## 2016-06-02 MED ORDER — DILTIAZEM HCL 25 MG/5ML IV SOLN
10.0000 mg | Freq: Once | INTRAVENOUS | Status: AC
  Administered 2016-06-02: 10 mg via INTRAVENOUS
  Filled 2016-06-02 (×2): qty 5

## 2016-06-02 MED ORDER — DILTIAZEM HCL ER COATED BEADS 180 MG PO CP24
180.0000 mg | ORAL_CAPSULE | Freq: Once | ORAL | Status: AC
  Administered 2016-06-02: 180 mg via ORAL
  Filled 2016-06-02: qty 1

## 2016-06-02 MED ORDER — ONDANSETRON 4 MG PO TBDP
4.0000 mg | ORAL_TABLET | Freq: Once | ORAL | Status: AC
  Administered 2016-06-02: 4 mg via ORAL

## 2016-06-02 NOTE — ED Notes (Signed)
Hooked pt up to monitor.

## 2016-06-02 NOTE — ED Triage Notes (Signed)
Pt to ED from home via [facility] c/o bilateral leg pain. Per EMS pt c/o bilateral swelling and leg pain that got progressively worse over the past 3 days. EMS also reports non compliance with medications and BP 177/103. Pt alert and oriented in no acute distress at this time.

## 2016-06-02 NOTE — ED Notes (Signed)
Pt is non-compliant with keeping cardiac monitor leads on and connected. Education provided to pt re: need for cardiac monitoring given pt's elevated HR and medications administered. Pt agrees to leave monitor on but removes it after nurse leaves room. Education reinforced by nursing and EDP Don Perking). Pt continues to be non-compliant with continuous monitoring.

## 2016-06-02 NOTE — ED Provider Notes (Signed)
[facility] Emergency Department Provider Note  ____________________________________________  Time seen: Approximately 8:11 AM  I have reviewed the triage vital signs and the nursing notes.   HISTORY  Chief Complaint Leg Pain   HPI Robin [name] is a 76 y.o. female with h/o afib, CHF(EF 25-30% on 04/2016), CAD, DM, HTN, HLD who presents for evaluation of bilateral lower extremity swelling. Patient reports 3 days of progressively worsening swelling and pain in her bilateral lower extremities. No prior history of DVT. She endorses compliance with her home meds however hasn't taken anything this morning. She reports that her pain is moderate at rest and, worse with ambulation and weightbearing, constant, throbbing, nonradiating, located on bilateral lower extremities. No chest pain or shortness of breath. No fever or chills.  Past Medical History:  Diagnosis Date  . Afib (HCC)   . Anxiety   . Asthma   . CHF (congestive heart failure) (HCC)   . Coronary artery disease   . Diabetes mellitus without complication (HCC)   . Hyperlipidemia   . Hypertension     Patient Active Problem List   Diagnosis Date Noted  . Atrial fibrillation with RVR (HCC) 05/03/2016  . Demand ischemia (HCC) 05/03/2016  . Moderate aortic stenosis 05/03/2016  . Moderate mitral regurgitation 05/03/2016  . History of noncompliance with medical treatment 05/03/2016  . Congestive dilated cardiomyopathy (HCC) 05/03/2016  . Chest pain 05/02/2016  . 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 Renal artery stenosis (HCC) 02/28/2016  . Accelerated hypertension 02/28/2016  . Mild dementia 02/28/2016  . Noncompliance 02/07/2016  . Renal infarct (HCC) 02/06/2016  . Atrial fibrillation with rapid ventricular response (HCC) 02/06/2016  . A-fib (HCC) 01/24/2016  . HTN (hypertension) 11/04/2015  . HLD (hyperlipidemia) 11/04/2015  . CAD (coronary artery disease) 11/04/2015  . Chronic systolic  CHF (congestive heart failure) (HCC) 11/04/2015  . Diabetes (HCC) 11/04/2015    Past Surgical History:  Procedure Laterality Date  . CORONARY ARTERY BYPASS GRAFT    . NO PAST SURGERIES    . PERIPHERAL VASCULAR CATHETERIZATION Left 03/02/2016   Procedure: Renal Intervention;  Surgeon: Renford Dills, MD;  Location: [facility] INVASIVE CV LAB;  Service: Cardiovascular;  Laterality: Left;    Prior to Admission medications   Medication Sig Start Date End Date Taking? Authorizing Provider  albuterol (ACCUNEB) 1.25 MG/3ML nebulizer solution Take 1.25 mg by nebulization every 4 (four) hours as needed for wheezing or shortness of breath.    Yes Historical Provider, MD  aspirin 81 MG chewable tablet Chew 81 mg by mouth daily. 04/13/16  Yes Historical Provider, MD  atorvastatin (LIPITOR) 80 MG tablet Take 1 tablet (80 mg total) by mouth daily at 6 PM. 03/05/16  Yes Enedina Finner, MD  furosemide (LASIX) 20 MG tablet Take 20 mg by mouth every morning.   Yes Historical Provider, MD  glipiZIDE (GLUCOTROL) 5 MG tablet Take 0.5 tablets (2.5 mg total) by mouth daily before breakfast. 03/06/16  Yes Enedina Finner, MD  LORazepam (ATIVAN) 1 MG tablet Take 1 tablet (1 mg total) by mouth 2 (two) times daily. 05/05/16 05/05/17 Yes Emily Filbert, MD  meclizine (ANTIVERT) 25 MG tablet Take 1 tablet (25 mg total) by mouth 3 (three) times daily as needed for dizziness. 11/06/15  Yes Enid Baas, MD  metoprolol (LOPRESSOR) 50 MG tablet Take 1 tablet (50 mg total) by mouth 3 (three) times daily. 05/25/16  Yes Emily Filbert, MD  nitroGLYCERIN (NITROSTAT) 0.4 MG SL tablet Place 1  tablet (0.4 mg total) under the tongue every 5 (five) minutes as needed for chest pain. 01/25/16  Yes Sital Mody, MD  ondansetron (ZOFRAN ODT) 4 MG disintegrating tablet Take 1 tablet (4 mg total) by mouth every 8 (eight) hours as needed for nausea or vomiting. 11/06/15  Yes Enid Baas, MD  pantoprazole (PROTONIX) 40 MG tablet Take 1  tablet (40 mg total) by mouth daily. Patient taking differently: Take 40 mg by mouth daily as needed. For acid reflux 11/06/15  Yes Enid Baas, MD  diltiazem (CARDIZEM CD) 180 MG 24 hr capsule Take 1 capsule (180 mg total) by mouth daily. Patient not taking: Reported on 05/25/2016 03/05/16   Enedina Finner, MD  mometasone-formoterol [facility]) 200-5 MCG/ACT AERO Inhale 2 puffs into the lungs 2 (two) times daily. Patient not taking: Reported on 06/02/2016 03/05/16   Enedina Finner, MD  oxyCODONE-acetaminophen (PERCOCET/ROXICET) 5-325 MG tablet Take 1 tablet by mouth every 4 (four) hours as needed for moderate pain or severe pain. Patient not taking: Reported on 06/02/2016 03/05/16   Enedina Finner, MD  rivaroxaban (XARELTO) 15 MG TABS tablet Take 1 tablet (15 mg total) by mouth daily with supper. 06/02/16   Nita Sickle, MD    Allergies Morphine and related  Family History  Problem Relation Age of Onset  . Hypertension Mother   . Heart failure Mother   . Heart disease Father     Social History Social History  Substance Use Topics  . Smoking status: Never Smoker  . Smokeless tobacco: Never Used  . Alcohol use No    Review of Systems  Constitutional: Negative for fever. Eyes: Negative for visual changes. ENT: Negative for sore throat. Neck: No neck pain  Cardiovascular: Negative for chest pain. Respiratory: Negative for shortness of breath. Gastrointestinal: Negative for abdominal pain, vomiting or diarrhea. Genitourinary: Negative for dysuria. Musculoskeletal: Negative for back pain. + b/l LE pain and swelling Skin: Negative for rash. Neurological: Negative for headaches, weakness or numbness. Psych: No SI or HI  ____________________________________________   PHYSICAL EXAM:  VITAL SIGNS: ED Triage Vitals  Enc Vitals Group     BP 06/02/16 0754 (!) 180/107     Pulse Rate 06/02/16 0754 (!) 112     Resp 06/02/16 0754 20     Temp 06/02/16 0754 97.6 F (36.4 C)     Temp  Source 06/02/16 0754 Oral     SpO2 06/02/16 0754 99 %     Weight 06/02/16 0755 140 lb (63.5 kg)     Height 06/02/16 0755 5\' 4"  (1.626 m)     Head Circumference --      Peak Flow --      Pain Score 06/02/16 0759 10     Pain Loc --      Pain Edu? --      Excl. in GC? --     Constitutional: Alert and oriented. Well appearing and in no apparent distress. HEENT:      Head: Normocephalic and atraumatic.         Eyes: Conjunctivae are normal. Sclera is non-icteric. EOMI. PERRL      Mouth/Throat: Mucous membranes are moist.       Neck: Supple with no signs of meningismus. Cardiovascular: Irregularly irregular rhythm with tachycardic rate. No murmurs, gallops, or rubs. 2+ symmetrical distal pulses are present in all extremities. No JVD. Respiratory: Normal respiratory effort. Lungs are clear to auscultation bilaterally. No wheezes, crackles, or rhonchi.  Gastrointestinal: Soft, non tender, and non distended with  positive bowel sounds. No rebound or guarding. Musculoskeletal: 3+ pitting edema on b/l with LLE>RLE Neurologic: Normal speech and language. Face is symmetric. Moving all extremities. No gross focal neurologic deficits are appreciated. Skin: Skin is warm, dry and intact. No rash noted. Psychiatric: Mood and affect are normal. Speech and behavior are normal.  ____________________________________________   LABS (all labs ordered are listed, but only abnormal results are displayed)  Labs Reviewed  CBC - Abnormal; Notable for the following:       Result Value   Hemoglobin 10.8 (*)    HCT 34.1 (*)    MCV 79.3 (*)    MCH 25.1 (*)    MCHC 31.7 (*)    RDW 16.6 (*)    All other components within normal limits  BASIC METABOLIC PANEL - Abnormal; Notable for the following:    Glucose, Bld 250 (*)    BUN 39 (*)    Creatinine, Ser 1.44 (*)    Calcium 8.7 (*)    GFR calc non Af Amer 35 (*)    GFR calc Af Amer 40 (*)    All other components within normal limits  BRAIN NATRIURETIC  PEPTIDE - Abnormal; Notable for the following:    B Natriuretic Peptide 2,012.0 (*)    All other components within normal limits  TROPONIN I - Abnormal; Notable for the following:    Troponin I 0.04 (*)    All other components within normal limits   ____________________________________________  EKG  ED ECG REPORT I, Nita Sickle, the attending physician, personally viewed and interpreted this ECG.  Atrial fibrillation, rate of 127, normal QRS interval, prolonged QTC, left axis deviation, no ST elevations or depressions, Q waves on anterior leads. Unchanged from prior  ____________________________________________  RADIOLOGY  CXR: No edema or consolidation. Left base atelectasis. Mild cardiomegaly. There is aortic atherosclerosis.  Doppler: No evidence of deep venous thrombosis in either lower extremity. No superficial thrombus seen on the right. In the left lower extremity, however, nonocclusive appearing venous thrombosis is seen in the greater saphenous vein, a superficial structure, with extension to the saphenous femoral junction on the left. While no thrombus is seen currently in the deep system on the left, there is high risk of propagation of thrombus from the saphenous femoral junction on the left into the left common femoral vein and possibly beyond. Given this circumstance, a repeat study in 2-3 days of the left side is felt to be warranted to assess for potential thrombus propagation.  Prominent but benign-appearing lymph node right inguinal region may well have reactive etiology.  There is bilateral lower extremity soft tissue edema. ____________________________________________   PROCEDURES  Procedure(s) performed: None Procedures Critical Care performed:  None ____________________________________________   INITIAL IMPRESSION / ASSESSMENT AND PLAN / ED COURSE   76 y.o. female with h/o afib, CHF(EF 25-30% on 04/2016), CAD, DM, HTN, HLD who presents for  evaluation of bilateral lower extremity swelling. Patient has 3+ pitting edema bilateral lower extremities with left lower extremity greater than right. Differential diagnoses including CHF exacerbation versus DVT. We'll send patient for Doppler studies. Patient also in A. fib with RVR in the setting of not taking her medications this morning. Her ventricular rate is between 120-130s. We'll give her by mouth meds and give her a dose of IV diltiazem 10 mg. EKG with no ischemic changes. Watch patient on telemetry. We'll hold off Lasix for now in case patient has DVT and a PE requiring preload to maintain cardiac output. Labs  pending.  Clinical Course as of Jun 02 1337  Sat Jun 02, 2016  1248 Patient with elevated BNP but no evidence of pulmonary edema with normal chest x-ray, no shortness of breath or oxygen requirement. Recommended admission for diuresis but patient opted to go home. Initially A. fib with RVR in the setting of medication noncompliance. Patient has been stable now after receiving her oral dose of metoprolol and diltiazem with heart rate of 98-103. She was given a dose of IV Lasix. Doppler showing superficial thrombi with no evidence of DVT on the LLE. Patient is on Xarelto. Patient has an appointment with her PCP in 3 days. I discussed with patient that she needs to have repeat Doppler studies done and she can discuss that with her PCP. I have sent PCP an email for follow up Dopplers as well. Will give afternoon dose of metoprolol and DC home.  [CV]    Clinical Course User Index [CV] Nita Sickle, MD    Pertinent labs & imaging results that were available during my care of the patient were reviewed by me and considered in my medical decision making (see chart for details).    ____________________________________________   FINAL CLINICAL IMPRESSION(S) / ED DIAGNOSES  Final diagnoses:  Acute on chronic congestive heart failure, unspecified congestive heart failure type (HCC)    Atrial fibrillation with RVR (HCC)  Non compliance w medication regimen  Superficial thrombosis of left lower extremity      NEW MEDICATIONS STARTED DURING THIS VISIT:  New Prescriptions   RIVAROXABAN (XARELTO) 15 MG TABS TABLET    Take 1 tablet (15 mg total) by mouth daily with supper.     Note:  This document was prepared using Dragon voice recognition software and may include unintentional dictation errors.    Nita Sickle, MD 06/02/16 1340

## 2016-06-02 NOTE — ED Notes (Signed)
Patient transported to Ultrasound 

## 2016-06-02 NOTE — Discharge Instructions (Signed)
Make sure to follow up with Dr. Marvis Moeller on your appointment Tuesday. You need to have a repeat ultrasound of your left leg to make sure that the superficial clot that they saw it is not getting larger. In the meantime you should be taking all of your medications. You already received 2 doses of metoprolol and 1 dose of Cardizem and one dose of lisinopril today. Take all your other medications at home. Return to the emergency room if you're having chest pain, shortness of breath, dizziness, or any other symptoms that are concerning to you.

## 2016-06-02 NOTE — ED Notes (Signed)

## 2016-06-05 ENCOUNTER — Emergency Department
Admission: EM | Admit: 2016-06-05 | Discharge: 2016-06-05 | Disposition: A | Discharge status: Home / Self Care | Payer: Medicare Other | Source: Home / Self Care | Attending: Emergency Medicine | Admitting: Emergency Medicine

## 2016-06-05 ENCOUNTER — Encounter: Payer: Self-pay | Admitting: Emergency Medicine

## 2016-06-05 ENCOUNTER — Emergency Department: Payer: Medicare Other

## 2016-06-05 DIAGNOSIS — Z7982 Long term (current) use of aspirin: Secondary | ICD-10-CM

## 2016-06-05 DIAGNOSIS — Z7984 Long term (current) use of oral hypoglycemic drugs: Secondary | ICD-10-CM | POA: Insufficient documentation

## 2016-06-05 DIAGNOSIS — Z79899 Other long term (current) drug therapy: Secondary | ICD-10-CM

## 2016-06-05 DIAGNOSIS — I5022 Chronic systolic (congestive) heart failure: Secondary | ICD-10-CM

## 2016-06-05 DIAGNOSIS — R1011 Right upper quadrant pain: Secondary | ICD-10-CM | POA: Diagnosis not present

## 2016-06-05 DIAGNOSIS — Z951 Presence of aortocoronary bypass graft: Secondary | ICD-10-CM

## 2016-06-05 DIAGNOSIS — E119 Type 2 diabetes mellitus without complications: Secondary | ICD-10-CM | POA: Insufficient documentation

## 2016-06-05 DIAGNOSIS — R1084 Generalized abdominal pain: Secondary | ICD-10-CM | POA: Insufficient documentation

## 2016-06-05 DIAGNOSIS — R197 Diarrhea, unspecified: Secondary | ICD-10-CM | POA: Insufficient documentation

## 2016-06-05 DIAGNOSIS — R112 Nausea with vomiting, unspecified: Secondary | ICD-10-CM

## 2016-06-05 DIAGNOSIS — I110 Hypertensive heart disease with heart failure: Secondary | ICD-10-CM | POA: Insufficient documentation

## 2016-06-05 DIAGNOSIS — I2510 Atherosclerotic heart disease of native coronary artery without angina pectoris: Secondary | ICD-10-CM | POA: Insufficient documentation

## 2016-06-05 DIAGNOSIS — R1110 Vomiting, unspecified: Secondary | ICD-10-CM

## 2016-06-05 DIAGNOSIS — R109 Unspecified abdominal pain: Secondary | ICD-10-CM

## 2016-06-05 DIAGNOSIS — J45909 Unspecified asthma, uncomplicated: Secondary | ICD-10-CM

## 2016-06-05 DIAGNOSIS — R110 Nausea: Secondary | ICD-10-CM

## 2016-06-05 LAB — CBC WITH DIFFERENTIAL/PLATELET
Basophils Absolute: 0.1 10*3/uL (ref 0–0.1)
Basophils Relative: 1 %
EOS ABS: 0 10*3/uL (ref 0–0.7)
EOS PCT: 0 %
HCT: 35.5 % (ref 35.0–47.0)
Hemoglobin: 11 g/dL — ABNORMAL LOW (ref 12.0–16.0)
LYMPHS ABS: 1.3 10*3/uL (ref 1.0–3.6)
Lymphocytes Relative: 11 %
MCH: 24.8 pg — AB (ref 26.0–34.0)
MCHC: 30.9 g/dL — ABNORMAL LOW (ref 32.0–36.0)
MCV: 80.1 fL (ref 80.0–100.0)
Monocytes Absolute: 0.6 10*3/uL (ref 0.2–0.9)
Monocytes Relative: 5 %
NEUTROS PCT: 83 %
Neutro Abs: 9.7 10*3/uL — ABNORMAL HIGH (ref 1.4–6.5)
PLATELETS: 228 10*3/uL (ref 150–440)
RBC: 4.44 MIL/uL (ref 3.80–5.20)
RDW: 16.2 % — ABNORMAL HIGH (ref 11.5–14.5)
WBC: 11.7 10*3/uL — AB (ref 3.6–11.0)

## 2016-06-05 LAB — COMPREHENSIVE METABOLIC PANEL
ALBUMIN: 3.5 g/dL (ref 3.5–5.0)
ALT: 39 U/L (ref 14–54)
ANION GAP: 7 (ref 5–15)
AST: 25 U/L (ref 15–41)
Alkaline Phosphatase: 174 U/L — ABNORMAL HIGH (ref 38–126)
BILIRUBIN TOTAL: 0.7 mg/dL (ref 0.3–1.2)
BUN: 35 mg/dL — ABNORMAL HIGH (ref 6–20)
CALCIUM: 8.6 mg/dL — AB (ref 8.9–10.3)
CO2: 26 mmol/L (ref 22–32)
Chloride: 107 mmol/L (ref 101–111)
Creatinine, Ser: 1.49 mg/dL — ABNORMAL HIGH (ref 0.44–1.00)
GFR calc non Af Amer: 33 mL/min — ABNORMAL LOW (ref >60)
GFR, EST AFRICAN AMERICAN: 38 mL/min — AB (ref >60)
GLUCOSE: 279 mg/dL — AB (ref 65–99)
Potassium: 4.5 mmol/L (ref 3.5–5.1)
SODIUM: 140 mmol/L (ref 135–145)
TOTAL PROTEIN: 6.9 g/dL (ref 6.5–8.1)

## 2016-06-05 LAB — GASTROINTESTINAL PANEL BY PCR, STOOL (REPLACES STOOL CULTURE)
ADENOVIRUS F40/41: NOT DETECTED
ASTROVIRUS: NOT DETECTED
CAMPYLOBACTER SPECIES: NOT DETECTED
Cryptosporidium: NOT DETECTED
Cyclospora cayetanensis: NOT DETECTED
ENTEROPATHOGENIC E COLI (EPEC): NOT DETECTED
ENTEROTOXIGENIC E COLI (ETEC): NOT DETECTED
Entamoeba histolytica: NOT DETECTED
Enteroaggregative E coli (EAEC): NOT DETECTED
Giardia lamblia: NOT DETECTED
NOROVIRUS GI/GII: NOT DETECTED
PLESIMONAS SHIGELLOIDES: NOT DETECTED
ROTAVIRUS A: NOT DETECTED
SAPOVIRUS (I, II, IV, AND V): NOT DETECTED
SHIGA LIKE TOXIN PRODUCING E COLI (STEC): NOT DETECTED
Salmonella species: NOT DETECTED
Shigella/Enteroinvasive E coli (EIEC): NOT DETECTED
VIBRIO SPECIES: NOT DETECTED
Vibrio cholerae: NOT DETECTED
Yersinia enterocolitica: NOT DETECTED

## 2016-06-05 LAB — BRAIN NATRIURETIC PEPTIDE: B Natriuretic Peptide: 2622 pg/mL — ABNORMAL HIGH (ref 0.0–100.0)

## 2016-06-05 LAB — C DIFFICILE QUICK SCREEN W PCR REFLEX
C DIFFICILE (CDIFF) INTERP: NOT DETECTED
C DIFFICILE (CDIFF) TOXIN: NEGATIVE
C DIFFICLE (CDIFF) ANTIGEN: NEGATIVE

## 2016-06-05 LAB — TROPONIN I

## 2016-06-05 MED ORDER — FENTANYL CITRATE (PF) 100 MCG/2ML IJ SOLN
INTRAMUSCULAR | Status: AC
  Filled 2016-06-05: qty 2

## 2016-06-05 MED ORDER — ONDANSETRON HCL 4 MG/2ML IJ SOLN
4.0000 mg | Freq: Once | INTRAMUSCULAR | Status: AC
  Administered 2016-06-05: 4 mg via INTRAVENOUS

## 2016-06-05 MED ORDER — ONDANSETRON HCL 4 MG/2ML IJ SOLN
INTRAMUSCULAR | Status: AC
  Administered 2016-06-05: 4 mg via INTRAVENOUS
  Filled 2016-06-05: qty 2

## 2016-06-05 MED ORDER — PROMETHAZINE HCL 25 MG/ML IJ SOLN
12.5000 mg | Freq: Once | INTRAMUSCULAR | Status: AC
Start: 2016-06-05 — End: 2016-06-05
  Administered 2016-06-05: 12.5 mg via INTRAVENOUS
  Filled 2016-06-05: qty 1

## 2016-06-05 MED ORDER — IOPAMIDOL (ISOVUE-300) INJECTION 61%
15.0000 mL | INTRAVENOUS | Status: AC
  Administered 2016-06-05 (×2): 15 mL via ORAL

## 2016-06-05 MED ORDER — FENTANYL CITRATE (PF) 100 MCG/2ML IJ SOLN
25.0000 ug | Freq: Once | INTRAMUSCULAR | Status: AC
  Administered 2016-06-05: 25 ug via INTRAVENOUS

## 2016-06-05 MED ORDER — ONDANSETRON HCL 4 MG/2ML IJ SOLN
4.0000 mg | Freq: Once | INTRAMUSCULAR | Status: AC
  Administered 2016-06-05: 4 mg via INTRAVENOUS
  Filled 2016-06-05: qty 2

## 2016-06-05 NOTE — ED Triage Notes (Signed)
Pt arrived to ED by EMS from home with c/o hypertension, N/V/D. EMS reports pt was recently discharged for hypertension, Pts BP is 212/102, pt poor historian at this time.

## 2016-06-05 NOTE — ED Notes (Signed)
This RN arrived to work at [employer]. Patient up for d/c and this RN went to d/c patient. Patient asked where her purse was. This RN cleaned and checked room with patient for purse and no purse was found in room. I asked patient if she brought it with her and she replied "I do not know" Patient watched and aware of this RN looking for purse in room and purse not found

## 2016-06-05 NOTE — Discharge Instructions (Signed)
Use the Zofran if needed for the nausea and vomiting milk one pill on your tongue 3 times a day. The ring small amounts of clear liquids frequently for the lunchtime then try the Bratt diet bananas rice applesauce toast  for lunch, regular food for supper. Please return for worse pain fever or unable to keep down fluids. Please also return if you get weak or lightheaded. Follow up with your regular doctor in the next couple days. You can try Pepto-Bismol 2 or 3 doses for the diarrhea.  Have your regular doctor review the CT report.  Recheck the superficial venous thrombosis in your left leg in another 2 days. Either return here or have your doctor look at you and repeat  the ultrasound.

## 2016-06-05 NOTE — ED Notes (Signed)
Pt refusing to keep on blood pressure cuff.  Pt states she is getting up and down to the bathroom too much to keep monitor on at this time.

## 2016-06-05 NOTE — ED Provider Notes (Signed)
[facility] Emergency Department Provider Note   ____________________________________________   First MD Initiated Contact with Patient 06/05/16 0110     (approximate)  I have reviewed the triage vital signs and the nursing notes.   HISTORY  Chief Complaint Hypertension; Nausea; and Emesis    HPI Robin [name] is a 76 y.o. female who reports she ate some spaghetti earlier this evening and then began having nausea vomiting and diarrhea and crampy abdominal pain which is quite bad. Patient said the diarrhea started about a half an hour after the spaghetti was eaten spaghetti tasted funny. Patient was here recently with leg swelling and hypertension.   Past Medical History:  Diagnosis Date  . Afib (HCC)   . Anxiety   . Asthma   . CHF (congestive heart failure) (HCC)   . Coronary artery disease   . Diabetes mellitus without complication (HCC)   . Hyperlipidemia   . Hypertension     Patient Active Problem List   Diagnosis Date Noted  . Atrial fibrillation with RVR (HCC) 05/03/2016  . Demand ischemia (HCC) 05/03/2016  . Moderate aortic stenosis 05/03/2016  . Moderate mitral regurgitation 05/03/2016  . History of noncompliance with medical treatment 05/03/2016  . Congestive dilated cardiomyopathy (HCC) 05/03/2016  . Chest pain 05/02/2016  . 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 Renal artery stenosis (HCC) 02/28/2016  . Accelerated hypertension 02/28/2016  . Mild dementia 02/28/2016  . Noncompliance 02/07/2016  . Renal infarct (HCC) 02/06/2016  . Atrial fibrillation with rapid ventricular response (HCC) 02/06/2016  . A-fib (HCC) 01/24/2016  . HTN (hypertension) 11/04/2015  . HLD (hyperlipidemia) 11/04/2015  . CAD (coronary artery disease) 11/04/2015  . Chronic systolic CHF (congestive heart failure) (HCC) 11/04/2015  . Diabetes (HCC) 11/04/2015    Past Surgical History:  Procedure Laterality Date  . CORONARY ARTERY BYPASS GRAFT    .  NO PAST SURGERIES    . PERIPHERAL VASCULAR CATHETERIZATION Left 03/02/2016   Procedure: Renal Intervention;  Surgeon: Renford Dills, MD;  Location: [facility] INVASIVE CV LAB;  Service: Cardiovascular;  Laterality: Left;    Prior to Admission medications   Medication Sig Start Date End Date Taking? Authorizing Provider  albuterol (ACCUNEB) 1.25 MG/3ML nebulizer solution Take 1.25 mg by nebulization every 4 (four) hours as needed for wheezing or shortness of breath.    Yes Historical Provider, MD  aspirin 81 MG chewable tablet Chew 81 mg by mouth daily. 04/13/16  Yes Historical Provider, MD  atorvastatin (LIPITOR) 80 MG tablet Take 1 tablet (80 mg total) by mouth daily at 6 PM. 03/05/16  Yes Enedina Finner, MD  furosemide (LASIX) 20 MG tablet Take 20 mg by mouth every morning.   Yes Historical Provider, MD  glipiZIDE (GLUCOTROL) 5 MG tablet Take 0.5 tablets (2.5 mg total) by mouth daily before breakfast. 03/06/16  Yes Enedina Finner, MD  LORazepam (ATIVAN) 1 MG tablet Take 1 tablet (1 mg total) by mouth 2 (two) times daily. 05/05/16 05/05/17 Yes Emily Filbert, MD  meclizine (ANTIVERT) 25 MG tablet Take 1 tablet (25 mg total) by mouth 3 (three) times daily as needed for dizziness. 11/06/15  Yes Enid Baas, MD  metoprolol (LOPRESSOR) 50 MG tablet Take 1 tablet (50 mg total) by mouth 3 (three) times daily. 05/25/16  Yes Emily Filbert, MD  nitroGLYCERIN (NITROSTAT) 0.4 MG SL tablet Place 1 tablet (0.4 mg total) under the tongue every 5 (five) minutes as needed for chest pain. 01/25/16  Yes Sital Mody,  MD  ondansetron (ZOFRAN ODT) 4 MG disintegrating tablet Take 1 tablet (4 mg total) by mouth every 8 (eight) hours as needed for nausea or vomiting. 11/06/15  Yes Enid Baas, MD  pantoprazole (PROTONIX) 40 MG tablet Take 1 tablet (40 mg total) by mouth daily. Patient taking differently: Take 40 mg by mouth daily as needed. For acid reflux 11/06/15  Yes Enid Baas, MD  rivaroxaban (XARELTO)  15 MG TABS tablet Take 1 tablet (15 mg total) by mouth daily with supper. 06/02/16  Yes Nita Sickle, MD  diltiazem (CARDIZEM CD) 180 MG 24 hr capsule Take 1 capsule (180 mg total) by mouth daily. Patient not taking: Reported on 05/25/2016 03/05/16   Enedina Finner, MD  mometasone-formoterol [facility]) 200-5 MCG/ACT AERO Inhale 2 puffs into the lungs 2 (two) times daily. Patient not taking: Reported on 06/02/2016 03/05/16   Enedina Finner, MD  oxyCODONE-acetaminophen (PERCOCET/ROXICET) 5-325 MG tablet Take 1 tablet by mouth every 4 (four) hours as needed for moderate pain or severe pain. Patient not taking: Reported on 06/02/2016 03/05/16   Enedina Finner, MD    Allergies Morphine and related  Family History  Problem Relation Age of Onset  . Hypertension Mother   . Heart failure Mother   . Heart disease Father     Social History Social History  Substance Use Topics  . Smoking status: Never Smoker  . Smokeless tobacco: Never Used  . Alcohol use No    Review of Systems Constitutional: No fever/chills Eyes: No visual changes. ENT: No sore throat. Cardiovascular: Denies chest pain. Respiratory: Denies shortness of breath. Gastrointestinal: See history of present illness Genitourinary: Negative for dysuria. Musculoskeletal: Negative for back pain. Skin: Negative for rash. Neurological: Negative for headaches, focal weakness or numbness.  10-point ROS otherwise negative.  ____________________________________________   PHYSICAL EXAM:  VITAL SIGNS: ED Triage Vitals  Enc Vitals Group     BP 06/05/16 0111 (!) 208/69     Pulse Rate 06/05/16 0111 72     Resp 06/05/16 0111 (!) 22     Temp 06/05/16 0111 97.4 F (36.3 C)     Temp Source 06/05/16 0111 Oral     SpO2 06/05/16 0116 99 %     Weight 06/05/16 0112 140 lb (63.5 kg)     Height 06/05/16 0112 5\' 4"  (1.626 m)     Head Circumference --      Peak Flow --      Pain Score --      Pain Loc --      Pain Edu? --      Excl. in GC? --     Constitutional: Alert and oriented. Well appearing and in no acute distress. Eyes: Conjunctivae are normal. PERRL. EOMI. Head: Atraumatic. Nose: No congestion/rhinnorhea. Mouth/Throat: Mucous membranes are moist.  Oropharynx non-erythematous. Neck: No stridor.  Cardiovascular: Normal rate, regular rhythm. Grossly normal heart sounds.  Good peripheral circulation. Respiratory: Normal respiratory effort.  No retractions. Lungs CTAB. Gastrointestinal: Soft But tender diffusely to palpation. Bowel sounds are decreased No distention. No abdominal bruits. No CVA tenderness. Musculoskeletal: No lower extremity tenderness some edema.  No joint effusions. Neurologic:  Normal speech and language. No gross focal neurologic deficits are appreciated. No gait instability. Skin:  Skin is warm, dry and intact. No rash noted.  ____________________________________________   LABS (all labs ordered are listed, but only abnormal results are displayed)  Labs Reviewed  COMPREHENSIVE METABOLIC PANEL - Abnormal; Notable for the following:       Result  Value   Glucose, Bld 279 (*)    BUN 35 (*)    Creatinine, Ser 1.49 (*)    Calcium 8.6 (*)    Alkaline Phosphatase 174 (*)    GFR calc non Af Amer 33 (*)    GFR calc Af Amer 38 (*)    All other components within normal limits  BRAIN NATRIURETIC PEPTIDE - Abnormal; Notable for the following:    B Natriuretic Peptide 2,622.0 (*)    All other components within normal limits  CBC WITH DIFFERENTIAL/PLATELET - Abnormal; Notable for the following:    WBC 11.7 (*)    Hemoglobin 11.0 (*)    MCH 24.8 (*)    MCHC 30.9 (*)    RDW 16.2 (*)    Neutro Abs 9.7 (*)    All other components within normal limits  C DIFFICILE QUICK SCREEN W PCR REFLEX  GASTROINTESTINAL PANEL BY PCR, STOOL (REPLACES STOOL CULTURE)  TROPONIN I  URINALYSIS, COMPLETE (UACMP) WITH MICROSCOPIC   ____________________________________________  EKG  EKG read and interpreted by me shows  normal sinus rhythm rate of 74 left axis no acute ST-T wave changes similar to one from February 24 of this year ____________________________________________  RADIOLOGY  Study Result   CLINICAL DATA:  Diffuse abdominal pain.  Vomiting.  Nausea.  EXAM: CT ABDOMEN AND PELVIS WITHOUT CONTRAST  TECHNIQUE: Multidetector CT imaging of the abdomen and pelvis was performed following the standard protocol without IV contrast.  COMPARISON:  Contrast-enhanced CT 03/27/2016  FINDINGS: Lower chest: Small bilateral pleural effusions have minimally increased in size from prior exam. A septal thickening at the bases. Heart is normal in size.  Hepatobiliary: No focal hepatic lesion allowing for lack contrast. Gallbladder physiologically distended, no calcified stone. No biliary dilatation.  Pancreas: No ductal dilatation or inflammation.  Spleen: Normal in size without focal abnormality.  Adrenals/Urinary Tract: Stable right renal atrophy and compensatory hypertrophy of the left kidney. Thinning of the upper left renal cortex. No hydronephrosis or perinephric edema. Probable hyperdense cyst in the inferior left kidney. No hydronephrosis or perinephric edema. Renovascular calcifications at both hila. Bladder is physiologically distended. No adrenal nodule.  Stomach/Bowel: Stomach is physiologically distended. No bowel obstruction with enteric contrast reaching the colon. Colonic diverticulosis involving the sigmoid colon without acute inflammation. Appendix is not confidently identified. No bowel wall thickening.  Vascular/Lymphatic: Advanced aortic and branch atherosclerosis. Distends the origin of the left renal artery. Small focal dissection of the distal abdominal aorta is not visualized without IV contrast. Increasing left inguinal and external iliac adenopathy, largest node in the inguinal station measures 12 mm. Prominent right external iliac nodes are also visualized.  Small retroperitoneal nodes are unchanged. No mesenteric adenopathy.  Reproductive: Uterus and bilateral adnexa are unremarkable.  Other: Minimal free fluid in the pelvis is nonspecific. No free air. No intra- abdominal abscess.  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 There are no acute or suspicious osseous abnormalities.  IMPRESSION: 1. Mild left inguinal and bilateral external iliac adenopathy, increasing from prior exams. Underlying etiology is uncertain, may be reactive, however lymphoproliferative disorder such as lymphoma is not excluded. 2. Otherwise no acute abnormality in the abdomen/pelvis. 3. Colonic diverticulosis without acute inflammation. 4. Advanced aortic and branch atherosclerosis. Right renal atrophy with compensatory hypertrophy of the left kidney. 5. Bilateral pleural effusions with mild increase from January.   Electronically Signed   By: Rubye Oaks M.D.   On: 06/05/2016 06:22  ____________________________________________   PROCEDURES  Procedure(s) performed:  Procedures  Critical Care performed:   ____________________________________________   INITIAL IMPRESSION / ASSESSMENT AND PLAN / ED COURSE  Pertinent labs & imaging results that were available during my care of the patient were reviewed by me and considered in my medical decision making (see chart for details).        ____________________________________________   FINAL CLINICAL IMPRESSION(S) / ED DIAGNOSES  Final diagnoses:  Nausea vomiting and diarrhea      NEW MEDICATIONS STARTED DURING THIS VISIT:  New Prescriptions   No medications on file     Note:  This document was prepared using Dragon voice recognition software and may include unintentional dictation errors.    Arnaldo Natal, MD 06/05/16 [phone]

## 2016-06-05 NOTE — ED Notes (Signed)
Pt refusing to drink the rest of her contrast.  EDP to be notified.

## 2016-06-07 ENCOUNTER — Emergency Department: Payer: Medicare Other

## 2016-06-07 ENCOUNTER — Inpatient Hospital Stay
Admission: EM | Admit: 2016-06-07 | Discharge: 2016-06-09 | DRG: 392 | Disposition: A | Discharge status: Home / Self Care | Payer: Medicare Other | Attending: Internal Medicine | Admitting: Internal Medicine

## 2016-06-07 ENCOUNTER — Encounter: Payer: Self-pay | Admitting: Emergency Medicine

## 2016-06-07 DIAGNOSIS — R740 Nonspecific elevation of levels of transaminase and lactic acid dehydrogenase [LDH]: Secondary | ICD-10-CM | POA: Diagnosis present

## 2016-06-07 DIAGNOSIS — Z885 Allergy status to narcotic agent status: Secondary | ICD-10-CM | POA: Diagnosis not present

## 2016-06-07 DIAGNOSIS — I340 Nonrheumatic mitral (valve) insufficiency: Secondary | ICD-10-CM | POA: Diagnosis present

## 2016-06-07 DIAGNOSIS — E785 Hyperlipidemia, unspecified: Secondary | ICD-10-CM | POA: Diagnosis present

## 2016-06-07 DIAGNOSIS — Z8249 Family history of ischemic heart disease and other diseases of the circulatory system: Secondary | ICD-10-CM

## 2016-06-07 DIAGNOSIS — I420 Dilated cardiomyopathy: Secondary | ICD-10-CM | POA: Diagnosis present

## 2016-06-07 DIAGNOSIS — R1013 Epigastric pain: Secondary | ICD-10-CM | POA: Diagnosis not present

## 2016-06-07 DIAGNOSIS — Z7984 Long term (current) use of oral hypoglycemic drugs: Secondary | ICD-10-CM | POA: Diagnosis not present

## 2016-06-07 DIAGNOSIS — F419 Anxiety disorder, unspecified: Secondary | ICD-10-CM | POA: Diagnosis present

## 2016-06-07 DIAGNOSIS — I2510 Atherosclerotic heart disease of native coronary artery without angina pectoris: Secondary | ICD-10-CM | POA: Diagnosis present

## 2016-06-07 DIAGNOSIS — I5022 Chronic systolic (congestive) heart failure: Secondary | ICD-10-CM | POA: Diagnosis present

## 2016-06-07 DIAGNOSIS — I110 Hypertensive heart disease with heart failure: Secondary | ICD-10-CM | POA: Diagnosis present

## 2016-06-07 DIAGNOSIS — I4891 Unspecified atrial fibrillation: Secondary | ICD-10-CM | POA: Diagnosis present

## 2016-06-07 DIAGNOSIS — I701 Atherosclerosis of renal artery: Secondary | ICD-10-CM | POA: Diagnosis present

## 2016-06-07 DIAGNOSIS — I350 Nonrheumatic aortic (valve) stenosis: Secondary | ICD-10-CM | POA: Diagnosis present

## 2016-06-07 DIAGNOSIS — Z9119 Patient's noncompliance with other medical treatment and regimen: Secondary | ICD-10-CM

## 2016-06-07 DIAGNOSIS — R1011 Right upper quadrant pain: Secondary | ICD-10-CM | POA: Diagnosis present

## 2016-06-07 DIAGNOSIS — E1151 Type 2 diabetes mellitus with diabetic peripheral angiopathy without gangrene: Secondary | ICD-10-CM | POA: Diagnosis present

## 2016-06-07 DIAGNOSIS — I513 Intracardiac thrombosis, not elsewhere classified: Secondary | ICD-10-CM | POA: Diagnosis present

## 2016-06-07 DIAGNOSIS — Z79899 Other long term (current) drug therapy: Secondary | ICD-10-CM

## 2016-06-07 DIAGNOSIS — R109 Unspecified abdominal pain: Secondary | ICD-10-CM | POA: Diagnosis present

## 2016-06-07 DIAGNOSIS — R7401 Elevation of levels of liver transaminase levels: Secondary | ICD-10-CM | POA: Insufficient documentation

## 2016-06-07 DIAGNOSIS — Z951 Presence of aortocoronary bypass graft: Secondary | ICD-10-CM

## 2016-06-07 DIAGNOSIS — Z7982 Long term (current) use of aspirin: Secondary | ICD-10-CM

## 2016-06-07 DIAGNOSIS — Z7901 Long term (current) use of anticoagulants: Secondary | ICD-10-CM

## 2016-06-07 LAB — CBC
HCT: 34.2 % — ABNORMAL LOW (ref 35.0–47.0)
Hemoglobin: 10.9 g/dL — ABNORMAL LOW (ref 12.0–16.0)
MCH: 25 pg — ABNORMAL LOW (ref 26.0–34.0)
MCHC: 31.8 g/dL — ABNORMAL LOW (ref 32.0–36.0)
MCV: 78.6 fL — ABNORMAL LOW (ref 80.0–100.0)
Platelets: 246 10*3/uL (ref 150–440)
RBC: 4.35 MIL/uL (ref 3.80–5.20)
RDW: 15.9 % — ABNORMAL HIGH (ref 11.5–14.5)
WBC: 9.1 10*3/uL (ref 3.6–11.0)

## 2016-06-07 LAB — COMPREHENSIVE METABOLIC PANEL
ALT: 147 U/L — AB (ref 14–54)
ANION GAP: 6 (ref 5–15)
AST: 127 U/L — ABNORMAL HIGH (ref 15–41)
Albumin: 3.3 g/dL — ABNORMAL LOW (ref 3.5–5.0)
Alkaline Phosphatase: 236 U/L — ABNORMAL HIGH (ref 38–126)
BUN: 30 mg/dL — ABNORMAL HIGH (ref 6–20)
CHLORIDE: 103 mmol/L (ref 101–111)
CO2: 28 mmol/L (ref 22–32)
CREATININE: 1.43 mg/dL — AB (ref 0.44–1.00)
Calcium: 8.8 mg/dL — ABNORMAL LOW (ref 8.9–10.3)
GFR, EST AFRICAN AMERICAN: 40 mL/min — AB (ref >60)
GFR, EST NON AFRICAN AMERICAN: 35 mL/min — AB (ref >60)
Glucose, Bld: 190 mg/dL — ABNORMAL HIGH (ref 65–99)
POTASSIUM: 4.3 mmol/L (ref 3.5–5.1)
Sodium: 137 mmol/L (ref 135–145)
Total Bilirubin: 1.2 mg/dL (ref 0.3–1.2)
Total Protein: 6.5 g/dL (ref 6.5–8.1)

## 2016-06-07 LAB — URINALYSIS, COMPLETE (UACMP) WITH MICROSCOPIC
Bacteria, UA: NONE SEEN
Bilirubin Urine: NEGATIVE
GLUCOSE, UA: NEGATIVE mg/dL
KETONES UR: 5 mg/dL — AB
Leukocytes, UA: NEGATIVE
NITRITE: NEGATIVE
PH: 5 (ref 5.0–8.0)
Protein, ur: >=300 mg/dL — AB
Specific Gravity, Urine: 1.024 (ref 1.005–1.030)

## 2016-06-07 LAB — LIPASE, BLOOD: Lipase: <10 U/L — ABNORMAL LOW (ref 11–51)

## 2016-06-07 MED ORDER — MECLIZINE HCL 25 MG PO TABS: 25.0000 mg | ORAL_TABLET | Freq: Three times a day (TID) | ORAL | Status: DC | PRN

## 2016-06-07 MED ORDER — SODIUM CHLORIDE 0.9 % IV SOLN: 250.0000 mL | INTRAVENOUS | Status: DC | PRN

## 2016-06-07 MED ORDER — FUROSEMIDE 20 MG PO TABS
20.0000 mg | ORAL_TABLET | Freq: Every morning | ORAL | Status: DC
  Administered 2016-06-08 – 2016-06-09 (×2): 20 mg via ORAL
  Filled 2016-06-07 (×2): qty 1

## 2016-06-07 MED ORDER — ACETAMINOPHEN 650 MG RE SUPP: 650.0000 mg | Freq: Four times a day (QID) | RECTAL | Status: DC | PRN

## 2016-06-07 MED ORDER — ATORVASTATIN CALCIUM 20 MG PO TABS
80.0000 mg | ORAL_TABLET | Freq: Every day | ORAL | Status: DC
  Administered 2016-06-08: 80 mg via ORAL
  Filled 2016-06-07: qty 4

## 2016-06-07 MED ORDER — METOPROLOL TARTRATE 50 MG PO TABS
50.0000 mg | ORAL_TABLET | ORAL | Status: AC
  Administered 2016-06-07: 50 mg via ORAL
  Filled 2016-06-07: qty 1

## 2016-06-07 MED ORDER — SODIUM CHLORIDE 0.9% FLUSH
3.0000 mL | Freq: Two times a day (BID) | INTRAVENOUS | Status: DC
  Administered 2016-06-08 (×3): 3 mL via INTRAVENOUS

## 2016-06-07 MED ORDER — OXYCODONE-ACETAMINOPHEN 5-325 MG PO TABS: 1.0000 | ORAL_TABLET | ORAL | Status: DC | PRN

## 2016-06-07 MED ORDER — ALBUTEROL SULFATE (2.5 MG/3ML) 0.083% IN NEBU
1.2500 mg | INHALATION_SOLUTION | RESPIRATORY_TRACT | Status: DC | PRN
  Administered 2016-06-08: 1.25 mg via RESPIRATORY_TRACT
  Filled 2016-06-07: qty 3

## 2016-06-07 MED ORDER — HYDRALAZINE HCL 20 MG/ML IJ SOLN
10.0000 mg | Freq: Four times a day (QID) | INTRAMUSCULAR | Status: DC | PRN
  Administered 2016-06-08 – 2016-06-09 (×2): 10 mg via INTRAVENOUS
  Filled 2016-06-07 (×2): qty 1

## 2016-06-07 MED ORDER — PANTOPRAZOLE SODIUM 40 MG PO TBEC
40.0000 mg | DELAYED_RELEASE_TABLET | Freq: Every day | ORAL | Status: DC
  Administered 2016-06-08 – 2016-06-09 (×2): 40 mg via ORAL
  Filled 2016-06-07 (×2): qty 1

## 2016-06-07 MED ORDER — ONDANSETRON HCL 4 MG/2ML IJ SOLN
4.0000 mg | Freq: Four times a day (QID) | INTRAMUSCULAR | Status: DC | PRN
  Administered 2016-06-07: 4 mg via INTRAVENOUS
  Filled 2016-06-07: qty 2

## 2016-06-07 MED ORDER — OXYCODONE-ACETAMINOPHEN 5-325 MG PO TABS
1.0000 | ORAL_TABLET | ORAL | Status: AC
  Administered 2016-06-07: 1 via ORAL
  Filled 2016-06-07: qty 1

## 2016-06-07 MED ORDER — ONDANSETRON HCL 4 MG PO TABS
4.0000 mg | ORAL_TABLET | Freq: Four times a day (QID) | ORAL | Status: DC | PRN
Start: 2016-06-07 — End: 2016-06-09

## 2016-06-07 MED ORDER — ONDANSETRON HCL 4 MG/2ML IJ SOLN
4.0000 mg | Freq: Once | INTRAMUSCULAR | Status: AC
  Administered 2016-06-07: 4 mg via INTRAVENOUS
  Filled 2016-06-07: qty 2

## 2016-06-07 MED ORDER — CEFTRIAXONE SODIUM-DEXTROSE 1-3.74 GM-% IV SOLR
1.0000 g | INTRAVENOUS | Status: DC
  Administered 2016-06-08: 1 g via INTRAVENOUS
  Filled 2016-06-07 (×3): qty 50

## 2016-06-07 MED ORDER — DEXTROSE 5 % IV SOLN: 1.0000 g | INTRAVENOUS | Status: DC

## 2016-06-07 MED ORDER — LORAZEPAM 1 MG PO TABS
1.0000 mg | ORAL_TABLET | Freq: Two times a day (BID) | ORAL | Status: DC
  Administered 2016-06-08 – 2016-06-09 (×2): 1 mg via ORAL
  Filled 2016-06-07 (×3): qty 1

## 2016-06-07 MED ORDER — INSULIN ASPART 100 UNIT/ML ~~LOC~~ SOLN
0.0000 [IU] | Freq: Three times a day (TID) | SUBCUTANEOUS | Status: DC
  Administered 2016-06-08 (×2): 2 [IU] via SUBCUTANEOUS
  Administered 2016-06-09: 3 [IU] via SUBCUTANEOUS
  Filled 2016-06-07 (×2): qty 2
  Filled 2016-06-07: qty 3

## 2016-06-07 MED ORDER — ONDANSETRON 4 MG PO TBDP: 4.0000 mg | ORAL_TABLET | Freq: Three times a day (TID) | ORAL | Status: DC | PRN

## 2016-06-07 MED ORDER — NITROGLYCERIN 0.4 MG SL SUBL: 0.4000 mg | SUBLINGUAL_TABLET | SUBLINGUAL | Status: DC | PRN

## 2016-06-07 MED ORDER — SODIUM CHLORIDE 0.9 % IV BOLUS (SEPSIS)
500.0000 mL | Freq: Once | INTRAVENOUS | Status: AC
  Administered 2016-06-07: 500 mL via INTRAVENOUS

## 2016-06-07 MED ORDER — ACETAMINOPHEN 325 MG PO TABS
650.0000 mg | ORAL_TABLET | Freq: Four times a day (QID) | ORAL | Status: DC | PRN
  Administered 2016-06-08 – 2016-06-09 (×3): 650 mg via ORAL
  Filled 2016-06-07 (×3): qty 2

## 2016-06-07 MED ORDER — METOPROLOL TARTRATE 50 MG PO TABS
50.0000 mg | ORAL_TABLET | Freq: Three times a day (TID) | ORAL | Status: DC
  Administered 2016-06-08 – 2016-06-09 (×4): 50 mg via ORAL
  Filled 2016-06-07 (×4): qty 1

## 2016-06-07 MED ORDER — SODIUM CHLORIDE 0.9% FLUSH: 3.0000 mL | INTRAVENOUS | Status: DC | PRN

## 2016-06-07 MED ORDER — MOMETASONE FURO-FORMOTEROL FUM 200-5 MCG/ACT IN AERO
2.0000 | INHALATION_SPRAY | Freq: Two times a day (BID) | RESPIRATORY_TRACT | Status: DC
  Administered 2016-06-08: 2 via RESPIRATORY_TRACT
  Filled 2016-06-07: qty 8.8

## 2016-06-07 MED ORDER — DILTIAZEM HCL ER COATED BEADS 180 MG PO CP24
180.0000 mg | ORAL_CAPSULE | Freq: Every day | ORAL | Status: DC
  Administered 2016-06-08: 180 mg via ORAL
  Filled 2016-06-07 (×2): qty 1

## 2016-06-07 MED ORDER — HYDROCODONE-ACETAMINOPHEN 5-325 MG PO TABS
1.0000 | ORAL_TABLET | ORAL | Status: DC | PRN
Start: 2016-06-07 — End: 2016-06-09
  Administered 2016-06-08 (×2): 2 via ORAL
  Filled 2016-06-07 (×2): qty 2

## 2016-06-07 NOTE — ED Notes (Signed)
Pt transported to room 220 

## 2016-06-07 NOTE — ED Triage Notes (Signed)
Pt in via POV with complaints of N/V/D since Monday.  Pt states, "I have not eaten anything since Monday."  Pt reports generalized abdominal cramping.  Pt hypertensive, states she has not taken any of her medications today.  NAD noted at this time.

## 2016-06-07 NOTE — Consult Note (Signed)
Date of Consultation:  06/07/2016  Requesting Physician:  Sharyn Creamer, MD  Reason for Consultation:  Abdominal pain  History of Present Illness: Robin [name] is a 76 y.o. female presenting today with abdominal pain.  She was seen in the ED on 3/27 with nausea and vomiting, though the patient says it was only for hypertension.  She reports she had a urinary tract infection though no urinalysis was performed and was given antibiotics which she lost, though there is no record of any new prescriptions.  She had a CT scan of abdomen and pelvis which did not show any acute intra-abdominal pathology.  On this interview, her history timeline is not clear.  Patient reports that this time, she has abdominal pain in the epigastric area.  Unclear as to how long it's been present.  She has had associated nausea and had non-bilious emesis episode as well.  Patient unable to tell me if anything has been able to improve the pain or make it worse.  Unclear if there is association with food intake, though her emesis was after trying to drink water, and her nausea on the last ED visit was after eating pasta.  Denies any fevers or chills, any chest pain or shortness of breath, denies any constipation or diarrhea, and denies any dysuria or hematuria.    Past Medical History: Past Medical History:  Diagnosis Date  . Afib (HCC)   . Anxiety   . Asthma   . CHF (congestive heart failure) (HCC)   . Coronary artery disease   . Diabetes mellitus without complication (HCC)   . Hyperlipidemia   . Hypertension      Past Surgical History: Past Surgical History:  Procedure Laterality Date  . CORONARY ARTERY BYPASS GRAFT    . NO PAST SURGERIES    . PERIPHERAL VASCULAR CATHETERIZATION Left 03/02/2016   Procedure: Renal Intervention;  Surgeon: Renford Dills, MD;  Location: [facility] INVASIVE CV LAB;  Service: Cardiovascular;  Laterality: Left;    Home Medications: Prior to Admission medications   Medication Sig  Start Date End Date Taking? Authorizing Provider  albuterol (ACCUNEB) 1.25 MG/3ML nebulizer solution Take 1.25 mg by nebulization every 4 (four) hours as needed for wheezing or shortness of breath.    Yes Historical Provider, MD  aspirin 81 MG chewable tablet Chew 81 mg by mouth daily. 04/13/16  Yes Historical Provider, MD  atorvastatin (LIPITOR) 80 MG tablet Take 1 tablet (80 mg total) by mouth daily at 6 PM. 03/05/16  Yes Enedina Finner, MD  furosemide (LASIX) 20 MG tablet Take 20 mg by mouth every morning.   Yes Historical Provider, MD  glipiZIDE (GLUCOTROL) 5 MG tablet Take 0.5 tablets (2.5 mg total) by mouth daily before breakfast. 03/06/16  Yes Enedina Finner, MD  LORazepam (ATIVAN) 1 MG tablet Take 1 tablet (1 mg total) by mouth 2 (two) times daily. 05/05/16 05/05/17 Yes Emily Filbert, MD  meclizine (ANTIVERT) 25 MG tablet Take 1 tablet (25 mg total) by mouth 3 (three) times daily as needed for dizziness. 11/06/15  Yes Enid Baas, MD  metoprolol (LOPRESSOR) 50 MG tablet Take 1 tablet (50 mg total) by mouth 3 (three) times daily. 05/25/16  Yes Emily Filbert, MD  nitroGLYCERIN (NITROSTAT) 0.4 MG SL tablet Place 1 tablet (0.4 mg total) under the tongue every 5 (five) minutes as needed for chest pain. 01/25/16  Yes Sital Mody, MD  ondansetron (ZOFRAN ODT) 4 MG disintegrating tablet Take 1 tablet (4 mg total) by mouth  every 8 (eight) hours as needed for nausea or vomiting. 11/06/15  Yes Enid Baas, MD  pantoprazole (PROTONIX) 40 MG tablet Take 1 tablet (40 mg total) by mouth daily. Patient taking differently: Take 40 mg by mouth daily as needed. For acid reflux 11/06/15  Yes Enid Baas, MD  rivaroxaban (XARELTO) 15 MG TABS tablet Take 1 tablet (15 mg total) by mouth daily with supper. 06/02/16  Yes Nita Sickle, MD  diltiazem (CARDIZEM CD) 180 MG 24 hr capsule Take 1 capsule (180 mg total) by mouth daily. Patient not taking: Reported on 05/25/2016 03/05/16   Enedina Finner, MD   mometasone-formoterol [facility]) 200-5 MCG/ACT AERO Inhale 2 puffs into the lungs 2 (two) times daily. Patient not taking: Reported on 06/02/2016 03/05/16   Enedina Finner, MD  oxyCODONE-acetaminophen (PERCOCET/ROXICET) 5-325 MG tablet Take 1 tablet by mouth every 4 (four) hours as needed for moderate pain or severe pain. Patient not taking: Reported on 06/02/2016 03/05/16   Enedina Finner, MD    Allergies: Allergies  Allergen Reactions  . Morphine And Related Other (See Comments)    Patient states it makes her "space out"    Social History:  reports that she has never smoked. She has never used smokeless tobacco. She reports that she does not drink alcohol or use drugs.   Family History: Family History  Problem Relation Age of Onset  . Hypertension Mother   . Heart failure Mother   . Heart disease Father     Review of Systems: Review of Systems  Constitutional: Negative for chills and fever.  HENT: Negative for hearing loss.   Eyes: Negative for blurred vision.  Respiratory: Negative for cough and shortness of breath.   Cardiovascular: Negative for chest pain.  Gastrointestinal: Positive for abdominal pain, nausea and vomiting. Negative for blood in stool, constipation and diarrhea.  Genitourinary: Negative for dysuria and hematuria.  Musculoskeletal: Negative for myalgias.  Skin: Negative for rash.  Neurological: Negative for dizziness.  Psychiatric/Behavioral: Negative for depression.  All other systems reviewed and are negative.   Physical Exam BP (!) 199/79   Pulse 73   Temp 97.6 F (36.4 C) (Oral)   Resp 17   Ht 5\' 4"  (1.626 m)   Wt 63.5 kg (140 lb)   LMP  (LMP Unknown)   SpO2 98%   BMI 24.03 kg/m  CONSTITUTIONAL: No acute distress HEENT:  Normocephalic, atraumatic, extraocular motion intact. NECK: Trachea is midline, and there is no jugular venous distension.  RESPIRATORY:  Lungs are clear, and breath sounds are equal bilaterally with no crackles.  No respiratory  distress. CARDIOVASCULAR: Heart is regular without murmurs, gallops, or rubs. GI: The abdomen is soft, non-distended, with mild discomfort over the epigastric and right upper quadrant regions.  Negative Murphy's sign. There were no palpable masses.  MUSCULOSKELETAL:  Normal muscle strength and tone in all four extremities.  No peripheral edema or cyanosis. SKIN: Skin turgor is normal. There are no pathologic skin lesions.  NEUROLOGIC:  Motor and sensation is grossly normal.  Cranial nerves are grossly intact. PSYCH:  Alert and oriented to person, place and time. Affect is normal.  Laboratory Analysis: Results for orders placed or performed during the hospital encounter of 06/07/16 (from the past 24 hour(s))  Lipase, blood     Status: Abnormal   Collection Time: 06/07/16  3:53 PM  Result Value Ref Range   Lipase <10 (L) 11 - 51 U/L  Comprehensive metabolic panel     Status: Abnormal  Collection Time: 06/07/16  3:53 PM  Result Value Ref Range   Sodium 137 135 - 145 mmol/L   Potassium 4.3 3.5 - 5.1 mmol/L   Chloride 103 101 - 111 mmol/L   CO2 28 22 - 32 mmol/L   Glucose, Bld 190 (H) 65 - 99 mg/dL   BUN 30 (H) 6 - 20 mg/dL   Creatinine, Ser 1.61 (H) 0.44 - 1.00 mg/dL   Calcium 8.8 (L) 8.9 - 10.3 mg/dL   Total Protein 6.5 6.5 - 8.1 g/dL   Albumin 3.3 (L) 3.5 - 5.0 g/dL   AST 096 (H) 15 - 41 U/L   ALT 147 (H) 14 - 54 U/L   Alkaline Phosphatase 236 (H) 38 - 126 U/L   Total Bilirubin 1.2 0.3 - 1.2 mg/dL   GFR calc non Af Amer 35 (L) >60 mL/min   GFR calc Af Amer 40 (L) >60 mL/min   Anion gap 6 5 - 15  CBC     Status: Abnormal   Collection Time: 06/07/16  3:53 PM  Result Value Ref Range   WBC 9.1 3.6 - 11.0 K/uL   RBC 4.35 3.80 - 5.20 MIL/uL   Hemoglobin 10.9 (L) 12.0 - 16.0 g/dL   HCT 04.5 (L) 40.9 - 81.1 %   MCV 78.6 (L) 80.0 - 100.0 fL   MCH 25.0 (L) 26.0 - 34.0 pg   MCHC 31.8 (L) 32.0 - 36.0 g/dL   RDW 91.4 (H) 78.2 - 95.6 %   Platelets 246 150 - 440 K/uL  Urinalysis,  Complete w Microscopic     Status: Abnormal   Collection Time: 06/07/16  3:53 PM  Result Value Ref Range   Color, Urine YELLOW (A) YELLOW   APPearance HAZY (A) CLEAR   Specific Gravity, Urine 1.024 1.005 - 1.030   pH 5.0 5.0 - 8.0   Glucose, UA NEGATIVE NEGATIVE mg/dL   Hgb urine dipstick SMALL (A) NEGATIVE   Bilirubin Urine NEGATIVE NEGATIVE   Ketones, ur 5 (A) NEGATIVE mg/dL   Protein, ur >=213 (A) NEGATIVE mg/dL   Nitrite NEGATIVE NEGATIVE   Leukocytes, UA NEGATIVE NEGATIVE   RBC / HPF 0-5 0 - 5 RBC/hpf   WBC, UA 0-5 0 - 5 WBC/hpf   Bacteria, UA NONE SEEN NONE SEEN   Squamous Epithelial / LPF 0-5 (A) NONE SEEN   Mucous PRESENT    Hyaline Casts, UA PRESENT     Imaging: Ct Head Wo Contrast  Result Date: 06/07/2016 CLINICAL DATA:  Nausea and vomiting since Monday. Generalized abdominal cramping. Hypertension. EXAM: CT HEAD WITHOUT CONTRAST TECHNIQUE: Contiguous axial images were obtained from the base of the skull through the vertex without intravenous contrast. COMPARISON:  12/28/2015. FINDINGS: Brain: No evidence of acute infarction, hemorrhage, hydrocephalus, extra-axial collection or mass lesion/mass effect. Atrophy with small vessel disease not unexpected for age. Vascular: No hyperdense vessel or unexpected calcification. Skull: Normal. Negative for fracture or focal lesion. Sinuses/Orbits: No acute finding. Other: None. Compared with priors, similar appearance. IMPRESSION: Stable atrophy and small vessel disease. No acute intracranial findings. Electronically Signed   By: Elsie Stain M.D.   On: 06/07/2016 19:03   US Abdomen Limited Ruq  Result Date: 06/07/2016 CLINICAL DATA:  Initial evaluation for diffuse abdominal pain for 5 days. EXAM: US ABDOMEN LIMITED - RIGHT UPPER QUADRANT COMPARISON:  Prior CT from 06/05/2016. FINDINGS: Gallbladder: No stones or sludge present within the gallbladder lumen. Small amount of free pericholecystic fluid present. Possible mild gallbladder wall  thickening  up to 6 mm. These changes may be related overall volume status. No sonographic Murphy sign elicited on exam. Common bile duct: Diameter: 3 mm Liver: No focal lesion identified. Within normal limits in parenchymal echogenicity. Incidental note made of an atrophic right kidney. IMPRESSION: 1. Small amount of pericholecystic free fluid with mild gallbladder wall thickening. These changes may be related to overall volume status. No cholelithiasis or other sonographic features to suggest acute cholecystitis. Correlation with symptomatology and laboratory values recommended. 2. No biliary dilatation. 3. Atrophic right kidney. Electronically Signed   By: Rise Mu M.D.   On: 06/07/2016 18:54    Assessment and Plan: This is a 76 y.o. female who presents with abdominal pain and associated nausea and emesis.  I have independently viewed the patient's laboratory and imaging studies and discussed them with the patient and ED team.  Her CT scan from 3/27 does not show any cholelithiasis or gallbladder distention.  Her ultrasound today has no stones or sludge and her CBD is normal size, but there is mild wall thickening and trace amount of fluid.  She has a normal WBC though was mildly elevated on 3/27, and today has elevated LFTs.  Unclear currently as to the etiology of her pain.  There is no cholelithiasis and acalculous cholecystitis is rare in the outpatient setting.  Her CBD is normal size, but her LFTs are elevated today compared to 3/27 with total bili up to 1.2 from 0.7, AST 127, ALT 147, and alkaline phosphatase of 236 from 174.  Her BNP was significantly elevated on 3/27 which could lead to liver congestion which could reflect the thickening and fluid noted on ultrasound.  No acute emergent or urgent surgical needs.  Would recommending admission to medical team for workup of her pain and lab findings.  Recommend obtaining HIDA scan and keep her NPO with gentle IV fluid hydration for the  scan.  Trend LFTs as well.  No definite evidence of infection, but agree with Ceftriaxone empirically for the time being.  She takes Xarelto for atrial fibrillation and would recommend holding this for now in case she does need surgery.  Given her comorbidities, she's not an ideal surgical candidate unless surgery is really needed.     Howie Ill, MD [facility]

## 2016-06-07 NOTE — ED Notes (Signed)
Patient ambulated to the restroom with no difficulty noted at this time.

## 2016-06-07 NOTE — ED Notes (Signed)
Surgeon at bedside.  

## 2016-06-07 NOTE — ED Provider Notes (Signed)
[facility] Emergency Department Provider Note   ____________________________________________   First MD Initiated Contact with Patient 06/07/16 1739     (approximate)  I have reviewed the triage vital signs and the nursing notes.   HISTORY  Chief Complaint Emesis    HPI Robin [name] is a 76 y.o. female here for evaluation of nausea and vomiting  The patient reports that for about the last 3 days she's had multiple episodes of cramping feeling and nausea in her stomach, then vomiting. Symptoms seem to come and go, but she reports she has vomited multiple times daily for at least the last 3 days.  She's been taking occasional Tylenol, total of about 6 pills over the last 3 days for the pain which she reports has not been helping. She is not seeing any black or bloody stool or emesis.  No chest pain or trouble breathing. No fevers or chills. Denies any abdominal surgeries in the past.  Past Medical History:  Diagnosis Date  . Afib (HCC)   . Anxiety   . Asthma   . CHF (congestive heart failure) (HCC)   . Coronary artery disease   . Diabetes mellitus without complication (HCC)   . Hyperlipidemia   . Hypertension     Patient Active Problem List   Diagnosis Date Noted  . Atrial fibrillation with RVR (HCC) 05/03/2016  . Demand ischemia (HCC) 05/03/2016  . Moderate aortic stenosis 05/03/2016  . Moderate mitral regurgitation 05/03/2016  . History of noncompliance with medical treatment 05/03/2016  . Congestive dilated cardiomyopathy (HCC) 05/03/2016  . Chest pain 05/02/2016  . 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 Renal artery stenosis (HCC) 02/28/2016  . Accelerated hypertension 02/28/2016  . Mild dementia 02/28/2016  . Noncompliance 02/07/2016  . Renal infarct (HCC) 02/06/2016  . Atrial fibrillation with rapid ventricular response (HCC) 02/06/2016  . A-fib (HCC) 01/24/2016  . HTN (hypertension) 11/04/2015  . HLD (hyperlipidemia)  11/04/2015  . CAD (coronary artery disease) 11/04/2015  . Chronic systolic CHF (congestive heart failure) (HCC) 11/04/2015  . Diabetes (HCC) 11/04/2015    Past Surgical History:  Procedure Laterality Date  . CORONARY ARTERY BYPASS GRAFT    . NO PAST SURGERIES    . PERIPHERAL VASCULAR CATHETERIZATION Left 03/02/2016   Procedure: Renal Intervention;  Surgeon: Renford Dills, MD;  Location: [facility] INVASIVE CV LAB;  Service: Cardiovascular;  Laterality: Left;    Prior to Admission medications   Medication Sig Start Date End Date Taking? Authorizing Provider  albuterol (ACCUNEB) 1.25 MG/3ML nebulizer solution Take 1.25 mg by nebulization every 4 (four) hours as needed for wheezing or shortness of breath.     Historical Provider, MD  aspirin 81 MG chewable tablet Chew 81 mg by mouth daily. 04/13/16   Historical Provider, MD  atorvastatin (LIPITOR) 80 MG tablet Take 1 tablet (80 mg total) by mouth daily at 6 PM. 03/05/16   Enedina Finner, MD  diltiazem (CARDIZEM CD) 180 MG 24 hr capsule Take 1 capsule (180 mg total) by mouth daily. Patient not taking: Reported on 05/25/2016 03/05/16   Enedina Finner, MD  furosemide (LASIX) 20 MG tablet Take 20 mg by mouth every morning.    Historical Provider, MD  glipiZIDE (GLUCOTROL) 5 MG tablet Take 0.5 tablets (2.5 mg total) by mouth daily before breakfast. 03/06/16   Enedina Finner, MD  LORazepam (ATIVAN) 1 MG tablet Take 1 tablet (1 mg total) by mouth 2 (two) times daily. 05/05/16 05/05/17  Emily Filbert, MD  meclizine (ANTIVERT) 25 MG tablet Take 1 tablet (25 mg total) by mouth 3 (three) times daily as needed for dizziness. 11/06/15   Enid Baas, MD  metoprolol (LOPRESSOR) 50 MG tablet Take 1 tablet (50 mg total) by mouth 3 (three) times daily. 05/25/16   Emily Filbert, MD  mometasone-formoterol [facility]) 200-5 MCG/ACT AERO Inhale 2 puffs into the lungs 2 (two) times daily. Patient not taking: Reported on 06/02/2016 03/05/16   Enedina Finner, MD  nitroGLYCERIN  (NITROSTAT) 0.4 MG SL tablet Place 1 tablet (0.4 mg total) under the tongue every 5 (five) minutes as needed for chest pain. 01/25/16   Adrian Saran, MD  ondansetron (ZOFRAN ODT) 4 MG disintegrating tablet Take 1 tablet (4 mg total) by mouth every 8 (eight) hours as needed for nausea or vomiting. 11/06/15   Enid Baas, MD  oxyCODONE-acetaminophen (PERCOCET/ROXICET) 5-325 MG tablet Take 1 tablet by mouth every 4 (four) hours as needed for moderate pain or severe pain. Patient not taking: Reported on 06/02/2016 03/05/16   Enedina Finner, MD  pantoprazole (PROTONIX) 40 MG tablet Take 1 tablet (40 mg total) by mouth daily. Patient taking differently: Take 40 mg by mouth daily as needed. For acid reflux 11/06/15   Enid Baas, MD  rivaroxaban (XARELTO) 15 MG TABS tablet Take 1 tablet (15 mg total) by mouth daily with supper. 06/02/16   Nita Sickle, MD    Allergies Morphine and related  Family History  Problem Relation Age of Onset  . Hypertension Mother   . Heart failure Mother   . Heart disease Father     Social History Social History  Substance Use Topics  . Smoking status: Never Smoker  . Smokeless tobacco: Never Used  . Alcohol use No    Review of Systems Constitutional: No fever/chills Eyes: No visual changes. ENT: No sore throat. Cardiovascular: Denies chest pain. Respiratory: Denies shortness of breath. Gastrointestinal: Was having diarrhea but this seems to have improved.  No constipation. Genitourinary: Negative for dysuria. Musculoskeletal: Negative for back pain. Skin: Negative for rash. Neurological: Negative for headaches, focal weakness or numbness.  10-point ROS otherwise negative.  ____________________________________________   PHYSICAL EXAM:  VITAL SIGNS: ED Triage Vitals  Enc Vitals Group     BP 06/07/16 1550 (!) 202/62     Pulse Rate 06/07/16 1735 72     Resp 06/07/16 1550 20     Temp 06/07/16 1550 97.6 F (36.4 C)     Temp Source 06/07/16  1550 Oral     SpO2 06/07/16 1550 97 %     Weight 06/07/16 1551 140 lb (63.5 kg)     Height 06/07/16 1551 5\' 4"  (1.626 m)     Head Circumference --      Peak Flow --      Pain Score 06/07/16 1549 10     Pain Loc --      Pain Edu? --      Excl. in GC? --     Constitutional: Alert and oriented. Well appearing and in no acute distress. Eyes: Conjunctivae are normal. PERRL. EOMI. Head: Atraumatic. Nose: No congestion/rhinnorhea. Mouth/Throat: Mucous membranes are moist.  Oropharynx non-erythematous. Neck: No stridor.   Cardiovascular: Normal rate, regular rhythm. Grossly normal heart sounds.  Good peripheral circulation. Respiratory: Normal respiratory effort.  No retractions. Lungs CTAB. Gastrointestinal: Soft and nontenderExcept for some discomfort noted in the mid epigastric to right upper quadrant without rebound or guarding. Negative bedside Murphy. No distention. No abdominal bruits. No CVA tenderness. No  pain at McBurney's point. No rebound or guarding throughout Musculoskeletal: No lower extremity tenderness nor edema.  No joint effusions. Neurologic:  Normal speech and language. No gross focal neurologic deficits are appreciated. Skin:  Skin is warm, dry and intact. No rash noted. Psychiatric: Mood and affect are normal. Speech and behavior are normal.  ____________________________________________   LABS (all labs ordered are listed, but only abnormal results are displayed)  Labs Reviewed  LIPASE, BLOOD - Abnormal; Notable for the following:       Result Value   Lipase <10 (*)    All other components within normal limits  COMPREHENSIVE METABOLIC PANEL - Abnormal; Notable for the following:    Glucose, Bld 190 (*)    BUN 30 (*)    Creatinine, Ser 1.43 (*)    Calcium 8.8 (*)    Albumin 3.3 (*)    AST 127 (*)    ALT 147 (*)    Alkaline Phosphatase 236 (*)    GFR calc non Af Amer 35 (*)    GFR calc Af Amer 40 (*)    All other components within normal limits  CBC -  Abnormal; Notable for the following:    Hemoglobin 10.9 (*)    HCT 34.2 (*)    MCV 78.6 (*)    MCH 25.0 (*)    MCHC 31.8 (*)    RDW 15.9 (*)    All other components within normal limits  URINALYSIS, COMPLETE (UACMP) WITH MICROSCOPIC - Abnormal; Notable for the following:    Color, Urine YELLOW (*)    APPearance HAZY (*)    Hgb urine dipstick SMALL (*)    Ketones, ur 5 (*)    Protein, ur >=300 (*)    Squamous Epithelial / LPF 0-5 (*)    All other components within normal limits  ACETAMINOPHEN LEVEL  HEPATITIS PANEL, ACUTE   ____________________________________________  EKG   ____________________________________________  RADIOLOGY  Ct Head Wo Contrast  Result Date: 06/07/2016 CLINICAL DATA:  Nausea and vomiting since Monday. Generalized abdominal cramping. Hypertension. EXAM: CT HEAD WITHOUT CONTRAST TECHNIQUE: Contiguous axial images were obtained from the base of the skull through the vertex without intravenous contrast. COMPARISON:  12/28/2015. FINDINGS: Brain: No evidence of acute infarction, hemorrhage, hydrocephalus, extra-axial collection or mass lesion/mass effect. Atrophy with small vessel disease not unexpected for age. Vascular: No hyperdense vessel or unexpected calcification. Skull: Normal. Negative for fracture or focal lesion. Sinuses/Orbits: No acute finding. Other: None. Compared with priors, similar appearance. IMPRESSION: Stable atrophy and small vessel disease. No acute intracranial findings. Electronically Signed   By: Elsie Stain M.D.   On: 06/07/2016 19:03   US Abdomen Limited Ruq  Result Date: 06/07/2016 CLINICAL DATA:  Initial evaluation for diffuse abdominal pain for 5 days. EXAM: US ABDOMEN LIMITED - RIGHT UPPER QUADRANT COMPARISON:  Prior CT from 06/05/2016. FINDINGS: Gallbladder: No stones or sludge present within the gallbladder lumen. Small amount of free pericholecystic fluid present. Possible mild gallbladder wall thickening up to 6 mm. These changes  may be related overall volume status. No sonographic Murphy sign elicited on exam. Common bile duct: Diameter: 3 mm Liver: No focal lesion identified. Within normal limits in parenchymal echogenicity. Incidental note made of an atrophic right kidney. IMPRESSION: 1. Small amount of pericholecystic free fluid with mild gallbladder wall thickening. These changes may be related to overall volume status. No cholelithiasis or other sonographic features to suggest acute cholecystitis. Correlation with symptomatology and laboratory values recommended. 2. No biliary dilatation. 3.  Atrophic right kidney. Electronically Signed   By: Rise Mu M.D.   On: 06/07/2016 18:54    ____________________________________________   PROCEDURES  Procedure(s) performed: None  Procedures  Critical Care performed: No  ____________________________________________   INITIAL IMPRESSION / ASSESSMENT AND PLAN / ED COURSE  Pertinent labs & imaging results that were available during my care of the patient were reviewed by me and considered in my medical decision making (see chart for details).  Differential diagnosis includes but is not limited to, abdominal perforation, aortic dissection, cholecystitis, appendicitis, diverticulitis, colitis, esophagitis/gastritis, kidney stone, pyelonephritis, urinary tract infection, aortic aneurysm. All are considered in decision and treatment plan. Based upon the patient's presentation and risk factors, the patient's recent CT, plan to proceed with an ultrasound to further evaluate for possible hepatic/biliary etiology given lab findings  ----------------------------------------- 8:13 PM on 06/07/2016 -----------------------------------------  Patient seen by general surgery, reports patient does have right-sided abdominal pain, and given ultrasound findings would recommend admission for serial evaluations, as well as obtaining a HIDA scan to further evaluate for the  possibility of early cholecystitis. Discussed with the hospitalist service, they will admit the patient and surgery service following along. Patient does report pain is improved after Percocet, blood pressure remains elevated, able to tolerate her home medication at this time. Patient and family agreeable with plan for admission and further evaluation including a HIDA scan which is not immediately available at this time in the ER.       ____________________________________________   FINAL CLINICAL IMPRESSION(S) / ED DIAGNOSES  Final diagnoses:  Abdominal pain  Right-sided abdominal pain of unknown cause  Transaminitis      NEW MEDICATIONS STARTED DURING THIS VISIT:  New Prescriptions   No medications on file     Note:  This document was prepared using Dragon voice recognition software and may include unintentional dictation errors.     Sharyn Creamer, MD 06/07/16 2014

## 2016-06-07 NOTE — H&P (Signed)
[employer] [facility] - [place] at [facility]   PATIENT NAME: Robin [name]    MR#:  [id number]  DATE OF BIRTH:  [birth year]  DATE OF ADMISSION:  06/07/2016  PRIMARY CARE PHYSICIAN: Leanna Sato, MD   REQUESTING/REFERRING PHYSICIAN:  Sharyn Creamer MD  CHIEF COMPLAINT:   Chief Complaint  Patient presents with  . Emesis    HISTORY OF PRESENT ILLNESS: Robin [name]  is a 76 y.o. female with a known history of Atrial fibrillation, anxiety, asthma, systolic CHF, coronary artery disease, diabetes type 2, essential hypertension and hyperlipidemia who is presenting with complaint of having pain in the right upper quadrant ongoing for the past 3 days. She Has been having nausea and vomiting. No fevers. Patient had a ultrasound of the abdomen which showed gallbladder wall thickening however it was inconclusive due to her history of CHF. She was also seen by surgery who recommended that she admitted and get a HIDA scan be placed on antibiotics. And her anticoagulation be held in case if she needs cholecystectomy. Patient also reports that because of her symptoms she has not been able to eat for the past few days  PAST MEDICAL HISTORY:   Past Medical History:  Diagnosis Date  . Afib (HCC)   . Anxiety   . Asthma   . CHF (congestive heart failure) (HCC)   . Coronary artery disease   . Diabetes mellitus without complication (HCC)   . Hyperlipidemia   . Hypertension     PAST SURGICAL HISTORY: Past Surgical History:  Procedure Laterality Date  . CORONARY ARTERY BYPASS GRAFT    . NO PAST SURGERIES    . PERIPHERAL VASCULAR CATHETERIZATION Left 03/02/2016   Procedure: Renal Intervention;  Surgeon: Renford Dills, MD;  Location: [facility] INVASIVE CV LAB;  Service: Cardiovascular;  Laterality: Left;    SOCIAL HISTORY:  Social History  Substance Use Topics  . Smoking status: Never Smoker  . Smokeless tobacco: Never Used  . Alcohol use No    FAMILY HISTORY:  Family History  Problem  Relation Age of Onset  . Hypertension Mother   . Heart failure Mother   . Heart disease Father     DRUG ALLERGIES:  Allergies  Allergen Reactions  . Morphine And Related Other (See Comments)    Patient states it makes her "space out"    REVIEW OF SYSTEMS:   CONSTITUTIONAL: No fever, fatigue or weakness.  EYES: No blurred or double vision.  EARS, NOSE, AND THROAT: No tinnitus or ear pain.  RESPIRATORY: No cough, shortness of breath, wheezing or hemoptysis.  CARDIOVASCULAR: No chest pain, orthopnea, edema.  GASTROINTESTINAL: Positive nausea, vomiting, no diarrhea right upper quadrant,  abdominal pain.  GENITOURINARY: No dysuria, hematuria.  ENDOCRINE: No polyuria, nocturia,  HEMATOLOGY: No anemia, easy bruising or bleeding SKIN: No rash or lesion. MUSCULOSKELETAL: No joint pain or arthritis.   NEUROLOGIC: No tingling, numbness, weakness.  PSYCHIATRY: No anxiety or depression.   MEDICATIONS AT HOME:  Prior to Admission medications   Medication Sig Start Date End Date Taking? Authorizing Provider  albuterol (ACCUNEB) 1.25 MG/3ML nebulizer solution Take 1.25 mg by nebulization every 4 (four) hours as needed for wheezing or shortness of breath.    Yes Historical Provider, MD  aspirin 81 MG chewable tablet Chew 81 mg by mouth daily. 04/13/16  Yes Historical Provider, MD  atorvastatin (LIPITOR) 80 MG tablet Take 1 tablet (80 mg total) by mouth daily at 6 PM. 03/05/16  Yes Enedina Finner, MD  furosemide (LASIX)  20 MG tablet Take 20 mg by mouth every morning.   Yes Historical Provider, MD  glipiZIDE (GLUCOTROL) 5 MG tablet Take 0.5 tablets (2.5 mg total) by mouth daily before breakfast. 03/06/16  Yes Enedina Finner, MD  LORazepam (ATIVAN) 1 MG tablet Take 1 tablet (1 mg total) by mouth 2 (two) times daily. 05/05/16 05/05/17 Yes Emily Filbert, MD  meclizine (ANTIVERT) 25 MG tablet Take 1 tablet (25 mg total) by mouth 3 (three) times daily as needed for dizziness. 11/06/15  Yes Enid Baas,  MD  metoprolol (LOPRESSOR) 50 MG tablet Take 1 tablet (50 mg total) by mouth 3 (three) times daily. 05/25/16  Yes Emily Filbert, MD  nitroGLYCERIN (NITROSTAT) 0.4 MG SL tablet Place 1 tablet (0.4 mg total) under the tongue every 5 (five) minutes as needed for chest pain. 01/25/16  Yes Sital Mody, MD  ondansetron (ZOFRAN ODT) 4 MG disintegrating tablet Take 1 tablet (4 mg total) by mouth every 8 (eight) hours as needed for nausea or vomiting. 11/06/15  Yes Enid Baas, MD  pantoprazole (PROTONIX) 40 MG tablet Take 1 tablet (40 mg total) by mouth daily. Patient taking differently: Take 40 mg by mouth daily as needed. For acid reflux 11/06/15  Yes Enid Baas, MD  rivaroxaban (XARELTO) 15 MG TABS tablet Take 1 tablet (15 mg total) by mouth daily with supper. 06/02/16  Yes Nita Sickle, MD  diltiazem (CARDIZEM CD) 180 MG 24 hr capsule Take 1 capsule (180 mg total) by mouth daily. Patient not taking: Reported on 05/25/2016 03/05/16   Enedina Finner, MD  mometasone-formoterol [facility]) 200-5 MCG/ACT AERO Inhale 2 puffs into the lungs 2 (two) times daily. Patient not taking: Reported on 06/02/2016 03/05/16   Enedina Finner, MD  oxyCODONE-acetaminophen (PERCOCET/ROXICET) 5-325 MG tablet Take 1 tablet by mouth every 4 (four) hours as needed for moderate pain or severe pain. Patient not taking: Reported on 06/02/2016 03/05/16   Enedina Finner, MD      PHYSICAL EXAMINATION:   VITAL SIGNS: Blood pressure (!) 199/79, pulse 73, temperature 97.6 F (36.4 C), temperature source Oral, resp. rate 17, height 5\' 4"  (1.626 m), weight 140 lb (63.5 kg), SpO2 98 %.  GENERAL:  76 y.o.-year-old patient lying in the bed with no acute distress.  EYES: Pupils equal, round, reactive to light and accommodation. No scleral icterus. Extraocular muscles intact.  HEENT: Head atraumatic, normocephalic. Oropharynx and nasopharynx clear.  NECK:  Supple, no jugular venous distention. No thyroid enlargement, no tenderness.   LUNGS: Normal breath sounds bilaterally, no wheezing, rales,rhonchi or crepitation. No use of accessory muscles of respiration.  CARDIOVASCULAR: S1, S2 normal. No murmurs, rubs, or gallops.  ABDOMEN: Soft Right upper quadrant tenderness , nondistended. Bowel sounds present. No organomegaly or mass.  EXTREMITIES: 2+ pedal edema, cyanosis, or clubbing.  NEUROLOGIC: Cranial nerves II through XII are intact. Muscle strength 5/5 in all extremities. Sensation intact. Gait not checked.  PSYCHIATRIC: The patient is alert and oriented x 3.  SKIN: No obvious rash, lesion, or ulcer.   LABORATORY PANEL:   CBC  Recent Labs Lab 06/02/16 0930 06/05/16 0133 06/07/16 1553  WBC 9.8 11.7* 9.1  HGB 10.8* 11.0* 10.9*  HCT 34.1* 35.5 34.2*  PLT 275 228 246  MCV 79.3* 80.1 78.6*  MCH 25.1* 24.8* 25.0*  MCHC 31.7* 30.9* 31.8*  RDW 16.6* 16.2* 15.9*  LYMPHSABS  --  1.3  --   MONOABS  --  0.6  --   EOSABS  --  0.0  --  BASOSABS  --  0.1  --    ------------------------------------------------------------------------------------------------------------------  Chemistries   Recent Labs Lab 06/02/16 0930 06/05/16 0133 06/07/16 1553  NA 138 140 137  K 4.4 4.5 4.3  CL 105 107 103  CO2 25 26 28   GLUCOSE 250* 279* 190*  BUN 39* 35* 30*  CREATININE 1.44* 1.49* 1.43*  CALCIUM 8.7* 8.6* 8.8*  AST  --  25 127*  ALT  --  39 147*  ALKPHOS  --  174* 236*  BILITOT  --  0.7 1.2   ------------------------------------------------------------------------------------------------------------------ estimated creatinine clearance is 29.4 mL/min (A) (by C-G formula based on SCr of 1.43 mg/dL (H)). ------------------------------------------------------------------------------------------------------------------ No results for input(s): TSH, T4TOTAL, T3FREE, THYROIDAB in the last 72 hours.  Invalid input(s): FREET3   Coagulation profile No results for input(s): INR, PROTIME in the last 168  hours. ------------------------------------------------------------------------------------------------------------------- No results for input(s): DDIMER in the last 72 hours. -------------------------------------------------------------------------------------------------------------------  Cardiac Enzymes  Recent Labs Lab 06/02/16 0930 06/05/16 0133  TROPONINI 0.04* <0.03   ------------------------------------------------------------------------------------------------------------------ Invalid input(s): POCBNP  ---------------------------------------------------------------------------------------------------------------  Urinalysis    Component Value Date/Time   COLORURINE YELLOW (A) 06/07/2016 1553   APPEARANCEUR HAZY (A) 06/07/2016 1553   APPEARANCEUR Clear 03/19/2014 1738   LABSPEC 1.024 06/07/2016 1553   LABSPEC 1.018 03/19/2014 1738   PHURINE 5.0 06/07/2016 1553   GLUCOSEU NEGATIVE 06/07/2016 1553   GLUCOSEU Negative 03/19/2014 1738   HGBUR SMALL (A) 06/07/2016 1553   BILIRUBINUR NEGATIVE 06/07/2016 1553   BILIRUBINUR Negative 03/19/2014 1738   KETONESUR 5 (A) 06/07/2016 1553   PROTEINUR >=300 (A) 06/07/2016 1553   NITRITE NEGATIVE 06/07/2016 1553   LEUKOCYTESUR NEGATIVE 06/07/2016 1553   LEUKOCYTESUR Negative 03/19/2014 1738     RADIOLOGY: Ct Head Wo Contrast  Result Date: 06/07/2016 CLINICAL DATA:  Nausea and vomiting since Monday. Generalized abdominal cramping. Hypertension. EXAM: CT HEAD WITHOUT CONTRAST TECHNIQUE: Contiguous axial images were obtained from the base of the skull through the vertex without intravenous contrast. COMPARISON:  12/28/2015. FINDINGS: Brain: No evidence of acute infarction, hemorrhage, hydrocephalus, extra-axial collection or mass lesion/mass effect. Atrophy with small vessel disease not unexpected for age. Vascular: No hyperdense vessel or unexpected calcification. Skull: Normal. Negative for fracture or focal lesion.  Sinuses/Orbits: No acute finding. Other: None. Compared with priors, similar appearance. IMPRESSION: Stable atrophy and small vessel disease. No acute intracranial findings. Electronically Signed   By: Elsie Stain M.D.   On: 06/07/2016 19:03   US Abdomen Limited Ruq  Result Date: 06/07/2016 CLINICAL DATA:  Initial evaluation for diffuse abdominal pain for 5 days. EXAM: US ABDOMEN LIMITED - RIGHT UPPER QUADRANT COMPARISON:  Prior CT from 06/05/2016. FINDINGS: Gallbladder: No stones or sludge present within the gallbladder lumen. Small amount of free pericholecystic fluid present. Possible mild gallbladder wall thickening up to 6 mm. These changes may be related overall volume status. No sonographic Murphy sign elicited on exam. Common bile duct: Diameter: 3 mm Liver: No focal lesion identified. Within normal limits in parenchymal echogenicity. Incidental note made of an atrophic right kidney. IMPRESSION: 1. Small amount of pericholecystic free fluid with mild gallbladder wall thickening. These changes may be related to overall volume status. No cholelithiasis or other sonographic features to suggest acute cholecystitis. Correlation with symptomatology and laboratory values recommended. 2. No biliary dilatation. 3. Atrophic right kidney. Electronically Signed   By: Rise Mu M.D.   On: 06/07/2016 18:54    EKG: Orders placed or performed during the hospital encounter of 06/07/16  . ED EKG  .  ED EKG    IMPRESSION AND PLAN: Patient is a 76 year old presenting with abdominal pain  1. Right upper quadrant abdominal pain possibly due to cholecystitis At this point she reports her symptoms are improved we'll place her on clear liquid diet HIDA scan for tomorrow morning  Treat with IV cefriaxone Also has elevated LFTs we'll repeat LFTs in the morning may be related to cholecystitis  2. h/o afib  We will hold her Xarelto for possible surgery Continue metoprolol and Cardizem  3. Chronic  systolic CHF I will continue oral Lasix  4. Diabetes type II Will Pl. on sliding scale insulin and hold her Glucotrol  5. Miscellaneous SCDs for DVT prophylaxis  All the records are reviewed and case discussed with ED provider. Management plans discussed with the patient, family and they are in agreement.  CODE STATUS: Code Status History    Date Active Date Inactive Code Status Order ID Comments User Context   05/02/2016  4:56 PM 05/03/2016  8:01 PM Full Code [id number]  Houston Siren, MD Inpatient   03/02/2016  3:34 PM 03/05/2016  4:16 PM Full Code [id number]  Renford Dills, MD Inpatient   02/28/2016  2:43 AM 03/02/2016  3:33 PM Full Code [id number]  Oralia Manis, MD Inpatient   02/06/2016  7:17 PM 02/10/2016 11:14 PM Full Code [id number]  Wyatt Haste, MD ED   01/24/2016  2:00 PM 01/25/2016  7:15 PM Full Code [id number]  Auburn Bilberry, MD Inpatient   11/25/2015  3:40 PM 11/26/2015  3:49 PM Full Code [id number]  Houston Siren, MD Inpatient   11/05/2015 12:33 AM 11/06/2015  6:05 PM Full Code [id number]  Oralia Manis, MD Inpatient   09/13/2015 10:10 AM 09/15/2015  7:33 PM Full Code [id number]  Altamese Dilling, MD Inpatient       TOTAL TIME TAKING CARE OF THIS PATIENT: 55 minutes.    Auburn Bilberry M.D on 06/07/2016 at 8:52 PM  Between 7am to 6pm - Pager - [phone]  After 6pm go to [email or web address] - password [email or web address] [facility]  [place]  [facility]  Office  [phone]  CC: Primary care physician; Leanna Sato, MD

## 2016-06-08 ENCOUNTER — Inpatient Hospital Stay: Payer: Medicare Other

## 2016-06-08 LAB — CBC
HCT: 36.1 % (ref 35.0–47.0)
Hemoglobin: 11.3 g/dL — ABNORMAL LOW (ref 12.0–16.0)
MCH: 24.9 pg — AB (ref 26.0–34.0)
MCHC: 31.3 g/dL — ABNORMAL LOW (ref 32.0–36.0)
MCV: 79.6 fL — AB (ref 80.0–100.0)
PLATELETS: 240 10*3/uL (ref 150–440)
RBC: 4.53 MIL/uL (ref 3.80–5.20)
RDW: 16 % — AB (ref 11.5–14.5)
WBC: 11.3 10*3/uL — ABNORMAL HIGH (ref 3.6–11.0)

## 2016-06-08 LAB — COMPREHENSIVE METABOLIC PANEL
ALBUMIN: 3 g/dL — AB (ref 3.5–5.0)
ALK PHOS: 169 U/L — AB (ref 38–126)
ALT: 142 U/L — ABNORMAL HIGH (ref 14–54)
ALT: 123 U/L — AB (ref 14–54)
ANION GAP: 5 (ref 5–15)
AST: 100 U/L — ABNORMAL HIGH (ref 15–41)
AST: 69 U/L — ABNORMAL HIGH (ref 15–41)
Albumin: 3.1 g/dL — ABNORMAL LOW (ref 3.5–5.0)
Alkaline Phosphatase: 206 U/L — ABNORMAL HIGH (ref 38–126)
Anion gap: 9 (ref 5–15)
BILIRUBIN TOTAL: 1.1 mg/dL (ref 0.3–1.2)
BILIRUBIN TOTAL: 0.9 mg/dL (ref 0.3–1.2)
BUN: 28 mg/dL — AB (ref 6–20)
BUN: 26 mg/dL — ABNORMAL HIGH (ref 6–20)
CALCIUM: 8.2 mg/dL — AB (ref 8.9–10.3)
CHLORIDE: 106 mmol/L (ref 101–111)
CO2: 24 mmol/L (ref 22–32)
CO2: 27 mmol/L (ref 22–32)
CREATININE: 1.46 mg/dL — AB (ref 0.44–1.00)
CREATININE: 1.34 mg/dL — AB (ref 0.44–1.00)
Calcium: 8.6 mg/dL — ABNORMAL LOW (ref 8.9–10.3)
Chloride: 106 mmol/L (ref 101–111)
GFR calc Af Amer: 39 mL/min — ABNORMAL LOW (ref >60)
GFR calc Af Amer: 44 mL/min — ABNORMAL LOW (ref >60)
GFR calc non Af Amer: 38 mL/min — ABNORMAL LOW (ref >60)
GFR, EST NON AFRICAN AMERICAN: 34 mL/min — AB (ref >60)
GLUCOSE: 204 mg/dL — AB (ref 65–99)
GLUCOSE: 166 mg/dL — AB (ref 65–99)
POTASSIUM: 4.5 mmol/L (ref 3.5–5.1)
Potassium: 3.7 mmol/L (ref 3.5–5.1)
SODIUM: 138 mmol/L (ref 135–145)
Sodium: 139 mmol/L (ref 135–145)
TOTAL PROTEIN: 6.3 g/dL — AB (ref 6.5–8.1)
TOTAL PROTEIN: 5.8 g/dL — AB (ref 6.5–8.1)

## 2016-06-08 LAB — GLUCOSE, CAPILLARY
GLUCOSE-CAPILLARY: 172 mg/dL — AB (ref 65–99)
Glucose-Capillary: 101 mg/dL — ABNORMAL HIGH (ref 65–99)
Glucose-Capillary: 166 mg/dL — ABNORMAL HIGH (ref 65–99)
Glucose-Capillary: 89 mg/dL (ref 65–99)

## 2016-06-08 LAB — ACETAMINOPHEN LEVEL: Acetaminophen (Tylenol), Serum: <10 ug/mL — ABNORMAL LOW (ref 10–30)

## 2016-06-08 MED ORDER — TECHNETIUM TC 99M MEBROFENIN IV KIT
5.2400 | PACK | Freq: Once | INTRAVENOUS | Status: AC | PRN
  Administered 2016-06-08: 5.24 via INTRAVENOUS

## 2016-06-08 MED ORDER — CEFTRIAXONE SODIUM 1 G IJ SOLR
1.0000 g | INTRAMUSCULAR | Status: DC
  Administered 2016-06-08: 1 g via INTRAVENOUS
  Filled 2016-06-08 (×2): qty 10

## 2016-06-08 MED ORDER — HYDRALAZINE HCL 20 MG/ML IJ SOLN
2.0000 mg | Freq: Once | INTRAMUSCULAR | Status: DC
Start: 2016-06-08 — End: 2016-06-09

## 2016-06-08 MED ORDER — OXYCODONE-ACETAMINOPHEN 5-325 MG PO TABS
1.0000 | ORAL_TABLET | ORAL | Status: DC | PRN
  Administered 2016-06-08: 1 via ORAL
  Filled 2016-06-08: qty 1

## 2016-06-08 NOTE — Progress Notes (Signed)
[facility] - [place] at [facility]   PATIENT NAME: Robin [name]    MR#:  [id number]  DATE OF BIRTH:  May 10, 1940  SUBJECTIVE:  CHIEF COMPLAINT:  Patient's abdominal pain is better. Tolerating clear liquid diet. Could not tolerate complete HIDA scan study  REVIEW OF SYSTEMS:  CONSTITUTIONAL: No fever, fatigue or weakness.  EYES: No blurred or double vision.  EARS, NOSE, AND THROAT: No tinnitus or ear pain.  RESPIRATORY: No cough, shortness of breath, wheezing or hemoptysis.  CARDIOVASCULAR: No chest pain, orthopnea, edema.  GASTROINTESTINAL: No nausea, vomiting, diarrhea abd. Reporting epigastric ominal pain.  GENITOURINARY: No dysuria, hematuria.  ENDOCRINE: No polyuria, nocturia,  HEMATOLOGY: No anemia, easy bruising or bleeding SKIN: No rash or lesion. MUSCULOSKELETAL: No joint pain or arthritis.   NEUROLOGIC: No tingling, numbness, weakness.  PSYCHIATRY: No anxiety or depression.   DRUG ALLERGIES:   Allergies  Allergen Reactions  . Morphine And Related Other (See Comments)    Patient states it makes her "space out"    VITALS:  Blood pressure (!) 148/72, pulse 64, temperature 97.6 F (36.4 C), temperature source Oral, resp. rate 20, height 5\' 4"  (1.626 m), weight 63.5 kg (140 lb), SpO2 96 %.  PHYSICAL EXAMINATION:  GENERAL:  76 y.o.-year-old patient lying in the bed with no acute distress.  EYES: Pupils equal, round, reactive to light and accommodation. No scleral icterus. Extraocular muscles intact.  HEENT: Head atraumatic, normocephalic. Oropharynx and nasopharynx clear.  NECK:  Supple, no jugular venous distention. No thyroid enlargement, no tenderness.  LUNGS: Normal breath sounds bilaterally, no wheezing, rales,rhonchi or crepitation. No use of accessory muscles of respiration.  CARDIOVASCULAR: S1, S2 normal. No murmurs, rubs, or gallops.  ABDOMEN: Soft, Tender epigastrium no rebound tenderness  nondistended. Bowel sounds present. No  organomegaly or mass.  EXTREMITIES: No pedal edema, cyanosis, or clubbing.  NEUROLOGIC: Cranial nerves II through XII are intact. Muscle strength 5/5 in all extremities. Sensation intact. Gait not checked.  PSYCHIATRIC: The patient is alert and oriented x 3.  SKIN: No obvious rash, lesion, or ulcer.    LABORATORY PANEL:   CBC  Recent Labs Lab 06/08/16 0108  WBC 11.3*  HGB 11.3*  HCT 36.1  PLT 240   ------------------------------------------------------------------------------------------------------------------  Chemistries   Recent Labs Lab 06/08/16 0108  NA 139  K 4.5  CL 106  CO2 24  GLUCOSE 204*  BUN 28*  CREATININE 1.46*  CALCIUM 8.6*  AST 100*  ALT 142*  ALKPHOS 206*  BILITOT 1.1   ------------------------------------------------------------------------------------------------------------------  Cardiac Enzymes  Recent Labs Lab 06/05/16 0133  TROPONINI <0.03   ------------------------------------------------------------------------------------------------------------------  RADIOLOGY:  Ct Head Wo Contrast  Result Date: 06/07/2016 CLINICAL DATA:  Nausea and vomiting since Monday. Generalized abdominal cramping. Hypertension. EXAM: CT HEAD WITHOUT CONTRAST TECHNIQUE: Contiguous axial images were obtained from the base of the skull through the vertex without intravenous contrast. COMPARISON:  12/28/2015. FINDINGS: Brain: No evidence of acute infarction, hemorrhage, hydrocephalus, extra-axial collection or mass lesion/mass effect. Atrophy with small vessel disease not unexpected for age. Vascular: No hyperdense vessel or unexpected calcification. Skull: Normal. Negative for fracture or focal lesion. Sinuses/Orbits: No acute finding. Other: None. Compared with priors, similar appearance. IMPRESSION: Stable atrophy and small vessel disease. No acute intracranial findings. Electronically Signed   By: Elsie Stain M.D.   On: 06/07/2016 19:03   Nm Hepato W/eject  Fract  Result Date: 06/08/2016 CLINICAL DATA:  Abdominal pain 5 days.  Nausea, vomiting, diarrhea. Patient refused to complete the  entire examination. The patient refused further evaluation at 46 minutes of imaging. The patient did not Ensure nor was a gallbladder ejection fraction performed. EXAM: NUCLEAR MEDICINE HEPATOBILIARY IMAGING WITH GALLBLADDER EF TECHNIQUE: Sequential images of the abdomen were obtained out to 60 minutes following intravenous administration of radiopharmaceutical. After oral ingestion of Ensure, gallbladder ejection fraction was determined. At 60 min, normal ejection fraction is greater than 33%. RADIOPHARMACEUTICALS:  5.24 mCi Tc-73m  Choletec IV COMPARISON:  Ultrasound abdomen 06/07/2016, CT abdomen 06/05/2016 FINDINGS: Prompt uptake and biliary excretion of activity by the liver is seen. Gallbladder activity is visualized, consistent with patency of cystic duct. Patient refused imaging after 46 minutes of initial imaging. No activity is identified in the small bowel at the time of refusal. The patency of the common bile duct cannot be evaluated. IMPRESSION: 1. No scintigraphic evidence of acute cholecystitis. Patent cystic duct. 2. Patient refused imaging after 46 minutes of initial imaging. No activity is identified in the small bowel at the time of refusal. The patency of the common bile duct cannot be evaluated. Electronically Signed   By: Elige Ko   On: 06/08/2016 12:37   US Abdomen Limited Ruq  Result Date: 06/07/2016 CLINICAL DATA:  Initial evaluation for diffuse abdominal pain for 5 days. EXAM: US ABDOMEN LIMITED - RIGHT UPPER QUADRANT COMPARISON:  Prior CT from 06/05/2016. FINDINGS: Gallbladder: No stones or sludge present within the gallbladder lumen. Small amount of free pericholecystic fluid present. Possible mild gallbladder wall thickening up to 6 mm. These changes may be related overall volume status. No sonographic Murphy sign elicited on exam. Common bile duct:  Diameter: 3 mm Liver: No focal lesion identified. Within normal limits in parenchymal echogenicity. Incidental note made of an atrophic right kidney. IMPRESSION: 1. Small amount of pericholecystic free fluid with mild gallbladder wall thickening. These changes may be related to overall volume status. No cholelithiasis or other sonographic features to suggest acute cholecystitis. Correlation with symptomatology and laboratory values recommended. 2. No biliary dilatation. 3. Atrophic right kidney. Electronically Signed   By: Rise Mu M.D.   On: 06/07/2016 18:54    EKG:   Orders placed or performed during the hospital encounter of 06/07/16  . ED EKG  . ED EKG    ASSESSMENT AND PLAN:     Patient is a 76 year old presenting with abdominal pain  1. Right upper quadrant abdominal pain possibly due to cholecystitis At this point she reports her symptoms are improved we'll place her on clear liquid diet HIDA scan -Incomplete study but however gallbladder did fill suggest patency of cystic duct Follow-up with surgery Advance diet as tolerated Treat with IV cefriaxone Repeat a.m. labs  2. h/o afib  Resume xarelto tomorrow if patient is tolerating advanced diet Continue metoprolol and Cardizem  3. Chronic systolic CHF I will continue oral Lasix  4. Diabetes type II Will Pl. on sliding scale insulin and hold her Glucotrol  5. Miscellaneous SCDs for DVT prophylaxis      All the records are reviewed and case discussed with Care Management/Social Workerr. Management plans discussed with the patient, family and they are in agreement.  CODE STATUS:  fc   TOTAL TIME TAKING CARE OF THIS PATIENT: 35 minutes.   POSSIBLE D/C IN 2 DAYS, DEPENDING ON CLINICAL CONDITION.  Note: This dictation was prepared with Dragon dictation along with smaller phrase technology. Any transcriptional errors that result from this process are unintentional.   Ramonita Lab M.D on 06/08/2016 at  5:03 PM  Between 7am to 6pm - Pager - [phone] After 6pm go to [email or web address] - password [email or web address] [facility]  [place] [facility]  Office  [phone]  CC: Primary care physician; Leanna Sato, MD

## 2016-06-08 NOTE — Care Management Important Message (Signed)
Important Message  Patient Details  Name: Robin [name] MRN: [id number] Date of Birth: [birth year]   Medicare Important Message Given:  Yes    Chapman Fitch, RN 06/08/2016, 5:56 PM

## 2016-06-08 NOTE — Progress Notes (Signed)
Patient said the Dr at [facility] said to stop taking cardizem.  I am not able to confirm this because [facility] is closed today

## 2016-06-08 NOTE — Progress Notes (Signed)
Initial Nutrition Assessment  DOCUMENTATION CODES:   Not applicable  INTERVENTION:  -Recommend addition of nutritional supplement once diet advance (Boost Breeze vs EnsureEnlive) until appetite improves and pt tolerating solid food diet -Recommend re-weighing patient  NUTRITION DIAGNOSIS:   Inadequate oral intake related to acute illness as evidenced by NPO status, per patient/family report.  GOAL:   Patient will meet greater than or equal to 90% of their needs  MONITOR:   Diet advancement, PO intake, Labs, Weight trends  REASON FOR ASSESSMENT:   Malnutrition Screening Tool    ASSESSMENT:    76 yo female admitted with right upper quadrant abdominal pain due to cholecystitis; hx of CHF, CAD, DM, HTN,  Pt reports eating nothing for 5 days PTA, not even sips of water with N/V. Prior to this, pt reports eating well, 3 meals per day. Pt does report 30 pound wt loss but indicates wt loss began 3 months ago when she was started on "fluid pill." Pt believes some of her wt loss is "true" wt loss and not just fluid but is not sure how much.  Pt does complain of N/V and abdominal pain on visit today  Nutrition-Focused physical exam completed. Findings are no fat depletion, no muscle depletion, and moderate edema.   Labs: Creatinine 1.46 Meds: reviewed  Diet Order:  Diet NPO time specified  Skin:  Reviewed, no issues  Last BM:  06/07/16  Height:   Ht Readings from Last 1 Encounters:  06/07/16 5\' 4"  (1.626 m)    Weight:   Wt Readings from Last 1 Encounters:  06/07/16 140 lb (63.5 kg)   BMI:  Body mass index is 24.03 kg/m.  Estimated Nutritional Needs:   Kcal:  1600-1800 kcals   Protein:  80-90 g  Fluid:  >/= 1.6 L  EDUCATION NEEDS:   No education needs identified at this time  Romelle Starcher MS, RD, LDN [phone] Pager  [phone] Weekend/On-Call Pager

## 2016-06-08 NOTE — Progress Notes (Signed)
Patient ID: Robin [name], female   DOB: 25-Mar-1940, 76 y.o.   MRN: [id number] Says her abd pain is some better. AVSS. Abdomen is soft, non tender. Good bs. Labs, liver enzymes mildly elevated but less than yesterday. Bili 1.1, was 1.2 yesterday HIDA scan pending

## 2016-06-08 NOTE — Progress Notes (Signed)
Patient sent via bed to nuclear med by orderly

## 2016-06-08 NOTE — Progress Notes (Signed)
Patient ID: Robin [name], female   DOB: 1941/02/24, 76 y.o.   MRN: [id number] HIDA scan- incomplete for assessing EF. However gallbladder did fill and this suggests patency of cystic duct. Do not feel pt's pain is due to biliary source.

## 2016-06-08 NOTE — Progress Notes (Signed)
Patient returned from her HIDA scan.  She was unable to complete the HIDA scan.  Dr Amado Coe came to see the patient. Dr Amado Coe spoke with  dr Evette Cristal.  Order given for clear liquid diet

## 2016-06-09 LAB — HEPATITIS PANEL, ACUTE
HEP B S AG: NEGATIVE
Hep A IgM: NEGATIVE
Hep B C IgM: NEGATIVE

## 2016-06-09 LAB — CBC
HCT: 34.5 % — ABNORMAL LOW (ref 35.0–47.0)
HEMOGLOBIN: 10.9 g/dL — AB (ref 12.0–16.0)
MCH: 25.1 pg — AB (ref 26.0–34.0)
MCHC: 31.6 g/dL — AB (ref 32.0–36.0)
MCV: 79.3 fL — ABNORMAL LOW (ref 80.0–100.0)
PLATELETS: 213 10*3/uL (ref 150–440)
RBC: 4.35 MIL/uL (ref 3.80–5.20)
RDW: 16.3 % — AB (ref 11.5–14.5)
WBC: 11.3 10*3/uL — ABNORMAL HIGH (ref 3.6–11.0)

## 2016-06-09 LAB — GLUCOSE, CAPILLARY
GLUCOSE-CAPILLARY: 210 mg/dL — AB (ref 65–99)
GLUCOSE-CAPILLARY: 93 mg/dL (ref 65–99)

## 2016-06-09 LAB — LIPASE, BLOOD

## 2016-06-09 MED ORDER — METOPROLOL TARTRATE 5 MG/5ML IV SOLN
5.0000 mg | Freq: Once | INTRAVENOUS | Status: AC
  Administered 2016-06-09: 5 mg via INTRAVENOUS
  Filled 2016-06-09: qty 5

## 2016-06-09 MED ORDER — METOPROLOL TARTRATE 100 MG PO TABS: 100.0000 mg | ORAL_TABLET | Freq: Three times a day (TID) | ORAL | 0 refills | Status: DC

## 2016-06-09 MED ORDER — DOCUSATE SODIUM 100 MG PO CAPS
100.0000 mg | ORAL_CAPSULE | Freq: Once | ORAL | Status: AC
  Administered 2016-06-09: 100 mg via ORAL
  Filled 2016-06-09: qty 1

## 2016-06-09 NOTE — Progress Notes (Addendum)
Patient ID: Robin [name], female   DOB: 1940-06-04, 76 y.o.   MRN: [id number] Patient states that she is feeling a whole lot better. She is actually  dressed up and ready to go Abdominal pain has resolved. Okay to advance diet and discharge

## 2016-06-09 NOTE — Progress Notes (Signed)
Patient discharged to home. IV site removed. Prescription given. Concerns addressed.  

## 2016-06-09 NOTE — Discharge Summary (Signed)
[facility] - [place] at [facility]   PATIENT NAME: Robin [name]    MR#:  [id number]  DATE OF BIRTH:  [birth year]  DATE OF ADMISSION:  06/07/2016 ADMITTING PHYSICIAN: Auburn Bilberry, MD  DATE OF DISCHARGE: 06/09/2016 PRIMARY CARE PHYSICIAN: Leanna Sato, MD    ADMISSION DIAGNOSIS:  Transaminitis [R74.0] Abdominal pain [R10.9] Right-sided abdominal pain of unknown cause [R10.9]  DISCHARGE DIAGNOSIS:  Active Problems:   Abdominal pain   SECONDARY DIAGNOSIS:   Past Medical History:  Diagnosis Date  . Afib (HCC)   . Anxiety   . Asthma   . CHF (congestive heart failure) (HCC)   . Coronary artery disease   . Diabetes mellitus without complication (HCC)   . Hyperlipidemia   . Hypertension     HOSPITAL COURSE:  HISTORY OF PRESENT ILLNESS: Robin [name]  is a 76 y.o. female with a known history of Atrial fibrillation, anxiety, asthma, systolic CHF, coronary artery disease, diabetes type 2, essential hypertension and hyperlipidemia who is presenting with complaint of having pain in the right upper quadrant ongoing for the past 3 days. She Has been having nausea and vomiting. No fevers. Patient had a ultrasound of the abdomen which showed gallbladder wall thickening however it was inconclusive due to her history of CHF. She was also seen by surgery who recommended that she admitted and get a HIDA scan be placed on antibiotics. And her anticoagulation be held in case if she needs cholecystectomy. Patient also reports that because of her symptoms she has not been able to eat for the past few days   Patient is a 76 year old presenting with abdominal pain  1. Right upper quadrant abdominal pain possibly secondary to constipation/? Cholecystitis At this point she reports her symptoms are improved  Patient's abdominal pain is completely resolved after bowel movements yesterday, tolerating diet and wants to go home HIDA scan -Incomplete study but however  gallbladder did fill suggest patency of cystic duct Follow-up with surgery as needed as an outpatient Advanced diet as tolerated, patient tolerated well Treated with IV cefriaxone during the hospital course, will discontinue as the patient does not have acute cholangitis  2. h/o afib  Resume xarelto   Continue metoprolol and Cardizem  3. Chronic systolic CHF  continue oral Lasix  4. Diabetes type II  on sliding scale insulin and hold her Glucotrol during the hospital course resume her home medication at the time of discharge  5. Hypertension  Continue home medication metoprolol dose increased  100 and resume Cardizem Blood pressure is elevated 5 mg of Lopressor IV  was given, patient is very anxious to go home, doesn't want to wait until blood pressure is better  5. Miscellaneous SCDs for DVT prophylaxis   DISCHARGE CONDITIONS:   Stable  CONSULTS OBTAINED:  Treatment Team:  Henrene Dodge, MD Seeplaputhur Wynona Luna, MD   PROCEDURES  None   DRUG ALLERGIES:   Allergies  Allergen Reactions  . Morphine And Related Other (See Comments)    Patient states it makes her "space out"    DISCHARGE MEDICATIONS:   Current Discharge Medication List    CONTINUE these medications which have CHANGED   Details  metoprolol (LOPRESSOR) 100 MG tablet Take 1 tablet (100 mg total) by mouth 3 (three) times daily. Qty: 90 tablet, Refills: 0      CONTINUE these medications which have NOT CHANGED   Details  albuterol (ACCUNEB) 1.25 MG/3ML nebulizer solution Take 1.25 mg by nebulization every 4 (four) hours as  needed for wheezing or shortness of breath.     aspirin 81 MG chewable tablet Chew 81 mg by mouth daily.    atorvastatin (LIPITOR) 80 MG tablet Take 1 tablet (80 mg total) by mouth daily at 6 PM. Qty: 30 tablet, Refills: 1    furosemide (LASIX) 20 MG tablet Take 20 mg by mouth every morning.    glipiZIDE (GLUCOTROL) 5 MG tablet Take 0.5 tablets (2.5 mg total) by mouth daily  before breakfast. Qty: 30 tablet, Refills: 1    LORazepam (ATIVAN) 1 MG tablet Take 1 tablet (1 mg total) by mouth 2 (two) times daily. Qty: 20 tablet, Refills: 0    meclizine (ANTIVERT) 25 MG tablet Take 1 tablet (25 mg total) by mouth 3 (three) times daily as needed for dizziness. Qty: 30 tablet, Refills: 0    nitroGLYCERIN (NITROSTAT) 0.4 MG SL tablet Place 1 tablet (0.4 mg total) under the tongue every 5 (five) minutes as needed for chest pain. Qty: 30 tablet, Refills: 0    ondansetron (ZOFRAN ODT) 4 MG disintegrating tablet Take 1 tablet (4 mg total) by mouth every 8 (eight) hours as needed for nausea or vomiting. Qty: 20 tablet, Refills: 0    pantoprazole (PROTONIX) 40 MG tablet Take 1 tablet (40 mg total) by mouth daily. Qty: 30 tablet, Refills: 2    rivaroxaban (XARELTO) 15 MG TABS tablet Take 1 tablet (15 mg total) by mouth daily with supper. Qty: 30 tablet, Refills: 1    diltiazem (CARDIZEM CD) 180 MG 24 hr capsule Take 1 capsule (180 mg total) by mouth daily. Qty: 60 capsule, Refills: 0    mometasone-formoterol (DULERA) 200-5 MCG/ACT AERO Inhale 2 puffs into the lungs 2 (two) times daily. Qty: 1 Inhaler, Refills: 0    oxyCODONE-acetaminophen (PERCOCET/ROXICET) 5-325 MG tablet Take 1 tablet by mouth every 4 (four) hours as needed for moderate pain or severe pain. Qty: 25 tablet, Refills: 0         DISCHARGE INSTRUCTIONS:   Follow-up with primary care physician in 5-7 days  DIET:  Diabetic diet, cardiac diet  DISCHARGE CONDITION:  Stable  ACTIVITY:  Activity as tolerated  OXYGEN:  Home Oxygen: No.   Oxygen Delivery: room air  DISCHARGE LOCATION:  home   If you experience worsening of your admission symptoms, develop shortness of breath, life threatening emergency, suicidal or homicidal thoughts you must seek medical attention immediately by calling 911 or calling your MD immediately  if symptoms less severe.  You Must read complete  instructions/literature along with all the possible adverse reactions/side effects for all the Medicines you take and that have been prescribed to you. Take any new Medicines after you have completely understood and accpet all the possible adverse reactions/side effects.   Please note  You were cared for by a hospitalist during your hospital stay. If you have any questions about your discharge medications or the care you received while you were in the hospital after you are discharged, you can call the unit and asked to speak with the hospitalist on call if the hospitalist that took care of you is not available. Once you are discharged, your primary care physician will handle any further medical issues. Please note that NO REFILLS for any discharge medications will be authorized once you are discharged, as it is imperative that you return to your primary care physician (or establish a relationship with a primary care physician if you do not have one) for your aftercare needs so that they  can reassess your need for medications and monitor your lab values.     Today  Chief Complaint  Patient presents with  . Emesis   Patient's abdominal pain completely resolved after having bowel movements yesterday. Denies any nausea vomiting tolerating advanced diet. Wants to go home and all dressed up ,okay to discharge patient from surgery standpoint  ROS:  CONSTITUTIONAL: Denies fevers, chills. Denies any fatigue, weakness.  EYES: Denies blurry vision, double vision, eye pain. EARS, NOSE, THROAT: Denies tinnitus, ear pain, hearing loss. RESPIRATORY: Denies cough, wheeze, shortness of breath.  CARDIOVASCULAR: Denies chest pain, palpitations, edema.  GASTROINTESTINAL: Denies nausea, vomiting, diarrhea, abdominal pain. Denies bright red blood per rectum. GENITOURINARY: Denies dysuria, hematuria. ENDOCRINE: Denies nocturia or thyroid problems. HEMATOLOGIC AND LYMPHATIC: Denies easy bruising or  bleeding. SKIN: Denies rash or lesion. MUSCULOSKELETAL: Denies pain in neck, back, shoulder, knees, hips or arthritic symptoms.  NEUROLOGIC: Denies paralysis, paresthesias.  PSYCHIATRIC: Denies anxiety or depressive symptoms.   VITAL SIGNS:  Blood pressure (!) 185/64, pulse 61, temperature 97.5 F (36.4 C), temperature source Oral, resp. rate 20, height 5\' 4"  (1.626 m), weight 63.5 kg (140 lb), SpO2 99 %.  I/O:    Intake/Output Summary (Last 24 hours) at 06/09/16 1325 Last data filed at 06/09/16 1012  Gross per 24 hour  Intake             1010 ml  Output              500 ml  Net              510 ml    PHYSICAL EXAMINATION:  GENERAL:  76 y.o.-year-old patient lying in the bed with no acute distress.  EYES: Pupils equal, round, reactive to light and accommodation. No scleral icterus. Extraocular muscles intact.  HEENT: Head atraumatic, normocephalic. Oropharynx and nasopharynx clear.  NECK:  Supple, no jugular venous distention. No thyroid enlargement, no tenderness.  LUNGS: Normal breath sounds bilaterally, no wheezing, rales,rhonchi or crepitation. No use of accessory muscles of respiration.  CARDIOVASCULAR: S1, S2 normal. No murmurs, rubs, or gallops.  ABDOMEN: Soft, non-tender, non-distended. Bowel sounds present. No organomegaly or mass.  EXTREMITIES: No pedal edema, cyanosis, or clubbing.  NEUROLOGIC: Cranial nerves II through XII are intact. Muscle strength 5/5 in all extremities. Sensation intact. Gait not checked.  PSYCHIATRIC: The patient is alert and oriented x 3.  SKIN: No obvious rash, lesion, or ulcer.   DATA REVIEW:   CBC  Recent Labs Lab 06/09/16 0409  WBC 11.3*  HGB 10.9*  HCT 34.5*  PLT 213    Chemistries   Recent Labs Lab 06/08/16 1745  NA 138  K 3.7  CL 106  CO2 27  GLUCOSE 166*  BUN 26*  CREATININE 1.34*  CALCIUM 8.2*  AST 69*  ALT 123*  ALKPHOS 169*  BILITOT 0.9    Cardiac Enzymes  Recent Labs Lab 06/05/16 0133  TROPONINI  <0.03    Microbiology Results  Results for orders placed or performed during the hospital encounter of 06/05/16  C difficile quick scan w PCR reflex     Status: None   Collection Time: 06/05/16  5:45 AM  Result Value Ref Range Status   C Diff antigen NEGATIVE NEGATIVE Final   C Diff toxin NEGATIVE NEGATIVE Final   C Diff interpretation No C. difficile detected.  Final  Gastrointestinal Panel by PCR , Stool     Status: None   Collection Time: 06/05/16  5:45 AM  Result  Value Ref Range Status   Campylobacter species NOT DETECTED NOT DETECTED Final   Plesimonas shigelloides NOT DETECTED NOT DETECTED Final   Salmonella species NOT DETECTED NOT DETECTED Final   Yersinia enterocolitica NOT DETECTED NOT DETECTED Final   Vibrio species NOT DETECTED NOT DETECTED Final   Vibrio cholerae NOT DETECTED NOT DETECTED Final   Enteroaggregative E coli (EAEC) NOT DETECTED NOT DETECTED Final   Enteropathogenic E coli (EPEC) NOT DETECTED NOT DETECTED Final   Enterotoxigenic E coli (ETEC) NOT DETECTED NOT DETECTED Final   Shiga like toxin producing E coli (STEC) NOT DETECTED NOT DETECTED Final   Shigella/Enteroinvasive E coli (EIEC) NOT DETECTED NOT DETECTED Final   Cryptosporidium NOT DETECTED NOT DETECTED Final   Cyclospora cayetanensis NOT DETECTED NOT DETECTED Final   Entamoeba histolytica NOT DETECTED NOT DETECTED Final   Giardia lamblia NOT DETECTED NOT DETECTED Final   Adenovirus F40/41 NOT DETECTED NOT DETECTED Final   Astrovirus NOT DETECTED NOT DETECTED Final   Norovirus GI/GII NOT DETECTED NOT DETECTED Final   Rotavirus A NOT DETECTED NOT DETECTED Final   Sapovirus (I, II, IV, and V) NOT DETECTED NOT DETECTED Final    RADIOLOGY:  Ct Head Wo Contrast  Result Date: 06/07/2016 CLINICAL DATA:  Nausea and vomiting since Monday. Generalized abdominal cramping. Hypertension. EXAM: CT HEAD WITHOUT CONTRAST TECHNIQUE: Contiguous axial images were obtained from the base of the skull through the  vertex without intravenous contrast. COMPARISON:  12/28/2015. FINDINGS: Brain: No evidence of acute infarction, hemorrhage, hydrocephalus, extra-axial collection or mass lesion/mass effect. Atrophy with small vessel disease not unexpected for age. Vascular: No hyperdense vessel or unexpected calcification. Skull: Normal. Negative for fracture or focal lesion. Sinuses/Orbits: No acute finding. Other: None. Compared with priors, similar appearance. IMPRESSION: Stable atrophy and small vessel disease. No acute intracranial findings. Electronically Signed   By: Elsie Stain M.D.   On: 06/07/2016 19:03   Nm Hepato W/eject Fract  Result Date: 06/08/2016 CLINICAL DATA:  Abdominal pain 5 days.  Nausea, vomiting, diarrhea. Patient refused to complete the entire examination. The patient refused further evaluation at 46 minutes of imaging. The patient did not Ensure nor was a gallbladder ejection fraction performed. EXAM: NUCLEAR MEDICINE HEPATOBILIARY IMAGING WITH GALLBLADDER EF TECHNIQUE: Sequential images of the abdomen were obtained out to 60 minutes following intravenous administration of radiopharmaceutical. After oral ingestion of Ensure, gallbladder ejection fraction was determined. At 60 min, normal ejection fraction is greater than 33%. RADIOPHARMACEUTICALS:  5.24 mCi Tc-64m  Choletec IV COMPARISON:  Ultrasound abdomen 06/07/2016, CT abdomen 06/05/2016 FINDINGS: Prompt uptake and biliary excretion of activity by the liver is seen. Gallbladder activity is visualized, consistent with patency of cystic duct. Patient refused imaging after 46 minutes of initial imaging. No activity is identified in the small bowel at the time of refusal. The patency of the common bile duct cannot be evaluated. IMPRESSION: 1. No scintigraphic evidence of acute cholecystitis. Patent cystic duct. 2. Patient refused imaging after 46 minutes of initial imaging. No activity is identified in the small bowel at the time of refusal. The  patency of the common bile duct cannot be evaluated. Electronically Signed   By: Elige Ko   On: 06/08/2016 12:37   US Abdomen Limited Ruq  Result Date: 06/07/2016 CLINICAL DATA:  Initial evaluation for diffuse abdominal pain for 5 days. EXAM: US ABDOMEN LIMITED - RIGHT UPPER QUADRANT COMPARISON:  Prior CT from 06/05/2016. FINDINGS: Gallbladder: No stones or sludge present within the gallbladder lumen. Small amount of  free pericholecystic fluid present. Possible mild gallbladder wall thickening up to 6 mm. These changes may be related overall volume status. No sonographic Murphy sign elicited on exam. Common bile duct: Diameter: 3 mm Liver: No focal lesion identified. Within normal limits in parenchymal echogenicity. Incidental note made of an atrophic right kidney. IMPRESSION: 1. Small amount of pericholecystic free fluid with mild gallbladder wall thickening. These changes may be related to overall volume status. No cholelithiasis or other sonographic features to suggest acute cholecystitis. Correlation with symptomatology and laboratory values recommended. 2. No biliary dilatation. 3. Atrophic right kidney. Electronically Signed   By: Rise Mu M.D.   On: 06/07/2016 18:54    EKG:   Orders placed or performed during the hospital encounter of 06/07/16  . ED EKG  . ED EKG      Management plans discussed with the patient, family and they are in agreement.  CODE STATUS:     Code Status Orders        Start     Ordered   06/07/16 2136  Full code  Continuous     06/07/16 2135    Code Status History    Date Active Date Inactive Code Status Order ID Comments User Context   05/02/2016  4:56 PM 05/03/2016  8:01 PM Full Code [id number]  Houston Siren, MD Inpatient   03/02/2016  3:34 PM 03/05/2016  4:16 PM Full Code [id number]  Renford Dills, MD Inpatient   02/28/2016  2:43 AM 03/02/2016  3:33 PM Full Code [id number]  Oralia Manis, MD Inpatient   02/06/2016  7:17 PM 02/10/2016  11:14 PM Full Code [id number]  Wyatt Haste, MD ED   01/24/2016  2:00 PM 01/25/2016  7:15 PM Full Code [id number]  Auburn Bilberry, MD Inpatient   11/25/2015  3:40 PM 11/26/2015  3:49 PM Full Code [id number]  Houston Siren, MD Inpatient   11/05/2015 12:33 AM 11/06/2015  6:05 PM Full Code [id number]  Oralia Manis, MD Inpatient   09/13/2015 10:10 AM 09/15/2015  7:33 PM Full Code [id number]  Altamese Dilling, MD Inpatient      TOTAL TIME TAKING CARE OF THIS PATIENT: 45  minutes.   Note: This dictation was prepared with Dragon dictation along with smaller phrase technology. Any transcriptional errors that result from this process are unintentional.   @[facility] @  on 06/09/2016 at 1:25 PM  Between 7am to 6pm - Pager - [phone]  After 6pm go to [email or web address] - password [email or web address] [facility]  [facility]  Office  [phone]  CC: Primary care physician; Leanna Sato, MD

## 2016-06-09 NOTE — Discharge Instructions (Signed)
Follow-up with primary care physician in 5-7 days °

## 2016-06-09 NOTE — Progress Notes (Signed)
Spoke with Dr.Hugelmeyer regarding patient request for something to make her bowels move. New order for colace 100 mg po once.  Anselm Jungling

## 2016-06-10 ENCOUNTER — Emergency Department
Admission: EM | Admit: 2016-06-10 | Discharge: 2016-06-10 | Disposition: A | Discharge status: Home / Self Care | Payer: Medicare Other | Attending: Emergency Medicine | Admitting: Emergency Medicine

## 2016-06-10 ENCOUNTER — Emergency Department: Payer: Medicare Other

## 2016-06-10 DIAGNOSIS — I480 Paroxysmal atrial fibrillation: Secondary | ICD-10-CM | POA: Insufficient documentation

## 2016-06-10 DIAGNOSIS — Z79899 Other long term (current) drug therapy: Secondary | ICD-10-CM | POA: Diagnosis not present

## 2016-06-10 DIAGNOSIS — Z7984 Long term (current) use of oral hypoglycemic drugs: Secondary | ICD-10-CM | POA: Diagnosis not present

## 2016-06-10 DIAGNOSIS — Z791 Long term (current) use of non-steroidal anti-inflammatories (NSAID): Secondary | ICD-10-CM | POA: Insufficient documentation

## 2016-06-10 DIAGNOSIS — I110 Hypertensive heart disease with heart failure: Secondary | ICD-10-CM | POA: Insufficient documentation

## 2016-06-10 DIAGNOSIS — I5022 Chronic systolic (congestive) heart failure: Secondary | ICD-10-CM | POA: Insufficient documentation

## 2016-06-10 DIAGNOSIS — E119 Type 2 diabetes mellitus without complications: Secondary | ICD-10-CM | POA: Diagnosis not present

## 2016-06-10 DIAGNOSIS — I4891 Unspecified atrial fibrillation: Secondary | ICD-10-CM

## 2016-06-10 DIAGNOSIS — I2510 Atherosclerotic heart disease of native coronary artery without angina pectoris: Secondary | ICD-10-CM | POA: Diagnosis not present

## 2016-06-10 DIAGNOSIS — J45909 Unspecified asthma, uncomplicated: Secondary | ICD-10-CM | POA: Diagnosis not present

## 2016-06-10 DIAGNOSIS — R0600 Dyspnea, unspecified: Secondary | ICD-10-CM

## 2016-06-10 DIAGNOSIS — R079 Chest pain, unspecified: Secondary | ICD-10-CM

## 2016-06-10 LAB — BASIC METABOLIC PANEL
ANION GAP: 10 (ref 5–15)
BUN: 24 mg/dL — ABNORMAL HIGH (ref 6–20)
CO2: 25 mmol/L (ref 22–32)
Calcium: 8.6 mg/dL — ABNORMAL LOW (ref 8.9–10.3)
Chloride: 104 mmol/L (ref 101–111)
Creatinine, Ser: 1.3 mg/dL — ABNORMAL HIGH (ref 0.44–1.00)
GFR, EST AFRICAN AMERICAN: 45 mL/min — AB (ref >60)
GFR, EST NON AFRICAN AMERICAN: 39 mL/min — AB (ref >60)
Glucose, Bld: 208 mg/dL — ABNORMAL HIGH (ref 65–99)
Potassium: 4.1 mmol/L (ref 3.5–5.1)
SODIUM: 139 mmol/L (ref 135–145)

## 2016-06-10 LAB — TROPONIN I

## 2016-06-10 LAB — CBC
HCT: 37.2 % (ref 35.0–47.0)
Hemoglobin: 11.4 g/dL — ABNORMAL LOW (ref 12.0–16.0)
MCH: 24.4 pg — ABNORMAL LOW (ref 26.0–34.0)
MCHC: 30.7 g/dL — ABNORMAL LOW (ref 32.0–36.0)
MCV: 79.5 fL — ABNORMAL LOW (ref 80.0–100.0)
Platelets: 213 10*3/uL (ref 150–440)
RBC: 4.68 MIL/uL (ref 3.80–5.20)
RDW: 16.7 % — ABNORMAL HIGH (ref 11.5–14.5)
WBC: 12.5 10*3/uL — AB (ref 3.6–11.0)

## 2016-06-10 MED ORDER — NITROGLYCERIN 0.4 MG SL SUBL
0.4000 mg | SUBLINGUAL_TABLET | SUBLINGUAL | Status: DC | PRN
  Administered 2016-06-10 (×2): 0.4 mg via SUBLINGUAL
  Filled 2016-06-10: qty 1

## 2016-06-10 MED ORDER — ACETAMINOPHEN 325 MG PO TABS
650.0000 mg | ORAL_TABLET | Freq: Once | ORAL | Status: AC
  Administered 2016-06-10: 650 mg via ORAL
  Filled 2016-06-10: qty 2

## 2016-06-10 MED ORDER — DILTIAZEM HCL 25 MG/5ML IV SOLN
15.0000 mg | Freq: Once | INTRAVENOUS | Status: AC
  Administered 2016-06-10: 15 mg via INTRAVENOUS
  Filled 2016-06-10 (×4): qty 5

## 2016-06-10 MED ORDER — DILTIAZEM HCL 25 MG/5ML IV SOLN
20.0000 mg | Freq: Once | INTRAVENOUS | Status: AC
  Administered 2016-06-10: 20 mg via INTRAVENOUS

## 2016-06-10 MED ORDER — OXYCODONE-ACETAMINOPHEN 5-325 MG PO TABS: 1.0000 | ORAL_TABLET | Freq: Once | ORAL | Status: DC

## 2016-06-10 MED ORDER — DILTIAZEM HCL 60 MG PO TABS
60.0000 mg | ORAL_TABLET | Freq: Once | ORAL | Status: AC
  Administered 2016-06-10: 60 mg via ORAL
  Filled 2016-06-10: qty 1

## 2016-06-10 MED ORDER — SODIUM CHLORIDE 0.9 % IV BOLUS (SEPSIS)
1000.0000 mL | Freq: Once | INTRAVENOUS | Status: AC
  Administered 2016-06-10: 1000 mL via INTRAVENOUS

## 2016-06-10 NOTE — ED Notes (Signed)
Pt's friend Annell Greening leaving at this time, request call w/ updates [phone]

## 2016-06-10 NOTE — ED Notes (Signed)
Pt report chest pain improved, but unable to stay awake to give rating.

## 2016-06-10 NOTE — ED Notes (Signed)
Pt refusing any further nitro

## 2016-06-10 NOTE — ED Notes (Signed)
Pt's friend Annell Greening called to inform of discharge and request ride, no answer, left VM

## 2016-06-10 NOTE — ED Provider Notes (Signed)
[facility] Emergency Department Provider Note   ____________________________________________   First MD Initiated Contact with Patient 06/10/16 0038     (approximate)  I have reviewed the triage vital signs and the nursing notes.   HISTORY  Chief Complaint Chest Pain    HPI Robin [name] is a 76 y.o. female who comes into the hospital today with chest pain and shortness of breath. The patient reports that her atrial fibrillation is acting up as well and the symptoms started 2 hours ago. The patient was discharged from the hospital today after being admitted with abdominal pain and possible cholecystitis. The patient reports that she was watching TV when the symptoms started. She took her medicines today but did not take anything for her chest pain. She reports that she still does have abdominal pain even though she had been discharged. The patient reports that her pain as a 12 out of 10 in intensity currently. She did have some vomiting as well as some dizziness and lightheadedness. The patient was scant so she decided to come into the hospital for evaluation.   Past Medical History:  Diagnosis Date  . Afib (HCC)   . Anxiety   . Asthma   . CHF (congestive heart failure) (HCC)   . Coronary artery disease   . Diabetes mellitus without complication (HCC)   . Hyperlipidemia   . Hypertension     Patient Active Problem List   Diagnosis Date Noted  . Abdominal pain 06/07/2016  . Transaminitis   . Atrial fibrillation with RVR (HCC) 05/03/2016  . Demand ischemia (HCC) 05/03/2016  . Moderate aortic stenosis 05/03/2016  . Moderate mitral regurgitation 05/03/2016  . History of noncompliance with medical treatment 05/03/2016  . Congestive dilated cardiomyopathy (HCC) 05/03/2016  . Chest pain 05/02/2016  . 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 Renal artery stenosis (HCC) 02/28/2016  . Accelerated hypertension 02/28/2016  . Mild dementia 02/28/2016    . Noncompliance 02/07/2016  . Renal infarct (HCC) 02/06/2016  . Atrial fibrillation with rapid ventricular response (HCC) 02/06/2016  . A-fib (HCC) 01/24/2016  . HTN (hypertension) 11/04/2015  . HLD (hyperlipidemia) 11/04/2015  . CAD (coronary artery disease) 11/04/2015  . Chronic systolic CHF (congestive heart failure) (HCC) 11/04/2015  . Diabetes (HCC) 11/04/2015    Past Surgical History:  Procedure Laterality Date  . CORONARY ARTERY BYPASS GRAFT    . NO PAST SURGERIES    . PERIPHERAL VASCULAR CATHETERIZATION Left 03/02/2016   Procedure: Renal Intervention;  Surgeon: Renford Dills, MD;  Location: [facility] INVASIVE CV LAB;  Service: Cardiovascular;  Laterality: Left;    Prior to Admission medications   Medication Sig Start Date End Date Taking? Authorizing Provider  albuterol (ACCUNEB) 1.25 MG/3ML nebulizer solution Take 1.25 mg by nebulization every 4 (four) hours as needed for wheezing or shortness of breath.     Historical Provider, MD  aspirin 81 MG chewable tablet Chew 81 mg by mouth daily. 04/13/16   Historical Provider, MD  atorvastatin (LIPITOR) 80 MG tablet Take 1 tablet (80 mg total) by mouth daily at 6 PM. 03/05/16   Enedina Finner, MD  diltiazem (CARDIZEM CD) 180 MG 24 hr capsule Take 1 capsule (180 mg total) by mouth daily. Patient not taking: Reported on 05/25/2016 03/05/16   Enedina Finner, MD  furosemide (LASIX) 20 MG tablet Take 20 mg by mouth every morning.    Historical Provider, MD  glipiZIDE (GLUCOTROL) 5 MG tablet Take 0.5 tablets (2.5 mg total) by mouth  daily before breakfast. 03/06/16   Enedina Finner, MD  LORazepam (ATIVAN) 1 MG tablet Take 1 tablet (1 mg total) by mouth 2 (two) times daily. 05/05/16 05/05/17  Emily Filbert, MD  meclizine (ANTIVERT) 25 MG tablet Take 1 tablet (25 mg total) by mouth 3 (three) times daily as needed for dizziness. 11/06/15   Enid Baas, MD  metoprolol (LOPRESSOR) 100 MG tablet Take 1 tablet (100 mg total) by mouth 3 (three) times  daily. 06/09/16   Ramonita Lab, MD  mometasone-formoterol (DULERA) 200-5 MCG/ACT AERO Inhale 2 puffs into the lungs 2 (two) times daily. Patient not taking: Reported on 06/02/2016 03/05/16   Enedina Finner, MD  nitroGLYCERIN (NITROSTAT) 0.4 MG SL tablet Place 1 tablet (0.4 mg total) under the tongue every 5 (five) minutes as needed for chest pain. 01/25/16   Adrian Saran, MD  ondansetron (ZOFRAN ODT) 4 MG disintegrating tablet Take 1 tablet (4 mg total) by mouth every 8 (eight) hours as needed for nausea or vomiting. 11/06/15   Enid Baas, MD  oxyCODONE-acetaminophen (PERCOCET/ROXICET) 5-325 MG tablet Take 1 tablet by mouth every 4 (four) hours as needed for moderate pain or severe pain. Patient not taking: Reported on 06/02/2016 03/05/16   Enedina Finner, MD  pantoprazole (PROTONIX) 40 MG tablet Take 1 tablet (40 mg total) by mouth daily. Patient taking differently: Take 40 mg by mouth daily as needed. For acid reflux 11/06/15   Enid Baas, MD  rivaroxaban (XARELTO) 15 MG TABS tablet Take 1 tablet (15 mg total) by mouth daily with supper. 06/02/16   Nita Sickle, MD    Allergies Morphine and related  Family History  Problem Relation Age of Onset  . Hypertension Mother   . Heart failure Mother   . Heart disease Father     Social History Social History  Substance Use Topics  . Smoking status: Never Smoker  . Smokeless tobacco: Never Used  . Alcohol use No    Review of Systems Constitutional: No fever/chills Eyes: No visual changes. ENT: No sore throat. Cardiovascular:  chest pain. Respiratory:  shortness of breath. Gastrointestinal: Nausea and Vomiting with No abdominal pain,  No diarrhea.  No constipation. Genitourinary: Negative for dysuria. Musculoskeletal: Negative for back pain. Skin: Negative for rash. Neurological: Negative for headaches, focal weakness or numbness.  10-point ROS otherwise negative.  ____________________________________________   PHYSICAL  EXAM:  VITAL SIGNS: ED Triage Vitals  Enc Vitals Group     BP 06/10/16 0013 (!) 133/95     Pulse Rate 06/10/16 0013 (!) 129     Resp 06/10/16 0013 18     Temp 06/10/16 0013 98.5 F (36.9 C)     Temp Source 06/10/16 0013 Oral     SpO2 06/10/16 0013 98 %     Weight 06/10/16 0010 140 lb (63.5 kg)     Height 06/10/16 0010 5\' 4"  (1.626 m)     Head Circumference --      Peak Flow --      Pain Score 06/10/16 0009 10     Pain Loc --      Pain Edu? --      Excl. in GC? --     Constitutional: Alert and oriented. Well appearing and in moderate to severe distress. Eyes: Conjunctivae are normal. PERRL. EOMI. Head: Atraumatic. Nose: No congestion/rhinnorhea. Mouth/Throat: Mucous membranes are moist.  Oropharynx non-erythematous. Cardiovascular: Tachycardia, irregularly, irregular rhythm. Grossly normal heart sounds.  Good peripheral circulation. Respiratory: Normal respiratory effort.  No retractions. Lungs CTAB.  Gastrointestinal: Soft and nontender. No distention. Positive bowel sounds Musculoskeletal:b/l lower extremity edema Neurologic:  Normal speech and language.  Skin:  Skin is warm, dry and intact.  Psychiatric: Mood and affect are normal.   ____________________________________________   LABS (all labs ordered are listed, but only abnormal results are displayed)  Labs Reviewed  BASIC METABOLIC PANEL - Abnormal; Notable for the following:       Result Value   Glucose, Bld 208 (*)    BUN 24 (*)    Creatinine, Ser 1.30 (*)    Calcium 8.6 (*)    GFR calc non Af Amer 39 (*)    GFR calc Af Amer 45 (*)    All other components within normal limits  CBC - Abnormal; Notable for the following:    WBC 12.5 (*)    Hemoglobin 11.4 (*)    MCV 79.5 (*)    MCH 24.4 (*)    MCHC 30.7 (*)    RDW 16.7 (*)    All other components within normal limits  TROPONIN I  TROPONIN I   ____________________________________________  EKG  ED ECG REPORT I, Rebecka Apley, the attending  physician, personally viewed and interpreted this ECG.   Date: 06/10/2016  EKG Time: 0009  Rate: 157  Rhythm: atrial fibrillation, rate 157  Axis: left axis deviation  Intervals:left anterior fascicular block  ST&T Change: Flipped T waves in leads 1, aVL  ____________________________________________  RADIOLOGY  CXR ____________________________________________   PROCEDURES  Procedure(s) performed: None  Procedures  Critical Care performed: Yes, see critical care note(s)  CRITICAL CARE Performed by: Lucrezia Europe P   Total critical care time: 30 minutes  Critical care time was exclusive of separately billable procedures and treating other patients.  Critical care was necessary to treat or prevent imminent or life-threatening deterioration.  Critical care was time spent personally by me on the following activities: development of treatment plan with patient and/or surrogate as well as nursing, discussions with consultants, evaluation of patient's response to treatment, examination of patient, obtaining history from patient or surrogate, ordering and performing treatments and interventions, ordering and review of laboratory studies, ordering and review of radiographic studies, pulse oximetry and re-evaluation of patient's condition.  ____________________________________________   INITIAL IMPRESSION / ASSESSMENT AND PLAN / ED COURSE  Pertinent labs & imaging results that were available during my care of the patient were reviewed by me and considered in my medical decision making (see chart for details).  This is a 76 year old female who comes into the hospital today with chest pain and atrial fibrillation with rapid ventricular rate. The patient reports that she has been taking her medicines that she was discharged from the hospital today. I will give the patient a dose of diltiazem 15 mg IV 1 dose. I will also give the patient some fluid. After the first dose of  diltiazem the patient's heart rate improved but was still not below 100. I gave the patient diltiazem 20 mg IV 1 dose and I also gave her some nitroglycerin for her pain that she reports was still an 8 out of 10 in intensity. The patient's initial set of blood work is fairly unremarkable. I will repeat the patient's troponin and reassess the patient once she is had some time to allow the medicines to work.  Clinical Course as of Jun 11 546  Sun Jun 10, 2016  0200 Mild cardiomegaly.  Lungs remain grossly clear. DG Chest 1 View [AW]  Clinical Course User Index [AW] Rebecka Apley, MD   After the second dose of diltiazem the patient's heart rate was in the high 90s to low 100s. I gave the patient an oral dose of diltiazem and after sometime her heart rate was in the 70s to 90s. She was sleeping comfortably and had an improvement in her chest pain. The patient's blood pressure was also improved. I repeated the patient's troponin I was negative. Since the patient's workup is unremarkable I will discharge the patient to home and have her follow-up with her primary care physician. The patient understands and agrees with that plan.  ____________________________________________   FINAL CLINICAL IMPRESSION(S) / ED DIAGNOSES  Final diagnoses:  Chest pain, unspecified type  Atrial fibrillation with rapid ventricular response (HCC)  Dyspnea, unspecified type      NEW MEDICATIONS STARTED DURING THIS VISIT:  New Prescriptions   No medications on file     Note:  This document was prepared using Dragon voice recognition software and may include unintentional dictation errors.    Rebecka Apley, MD 06/10/16 [phone]

## 2016-06-10 NOTE — ED Triage Notes (Signed)
Pt to triage via wheelchair. Pt reports she is having pain to her central chest that radiates into both arms. +n/v and shortness of breath. Pt was just released from the hospital on Saturday afternoon 06/09/16 (less than 12 hours ago).

## 2016-06-10 NOTE — Discharge Instructions (Signed)
After some medication yet heart rate is improved. Her heart rate is in the 80s and 90s. Her blood pressure is also improved to 156/65 and your sleeping comfortably with no chest pain at this time. Please follow-up with your regular doctor for further evaluation of your A. fib.

## 2016-06-10 NOTE — ED Notes (Signed)
[facility] again, he states he is leaving home now to pick up pt.  Pt assisted with getting dressed

## 2016-06-12 ENCOUNTER — Encounter: Payer: Self-pay | Admitting: Emergency Medicine

## 2016-06-12 ENCOUNTER — Emergency Department
Admission: EM | Admit: 2016-06-12 | Discharge: 2016-06-13 | Disposition: A | Discharge status: Home / Self Care | Payer: Medicare Other | Attending: Emergency Medicine | Admitting: Emergency Medicine

## 2016-06-12 ENCOUNTER — Emergency Department: Payer: Medicare Other

## 2016-06-12 DIAGNOSIS — Z9114 Patient's other noncompliance with medication regimen: Secondary | ICD-10-CM | POA: Insufficient documentation

## 2016-06-12 DIAGNOSIS — Z951 Presence of aortocoronary bypass graft: Secondary | ICD-10-CM | POA: Diagnosis not present

## 2016-06-12 DIAGNOSIS — J45909 Unspecified asthma, uncomplicated: Secondary | ICD-10-CM | POA: Diagnosis not present

## 2016-06-12 DIAGNOSIS — R609 Edema, unspecified: Secondary | ICD-10-CM

## 2016-06-12 DIAGNOSIS — I82412 Acute embolism and thrombosis of left femoral vein: Secondary | ICD-10-CM

## 2016-06-12 DIAGNOSIS — I5022 Chronic systolic (congestive) heart failure: Secondary | ICD-10-CM | POA: Insufficient documentation

## 2016-06-12 DIAGNOSIS — Z7984 Long term (current) use of oral hypoglycemic drugs: Secondary | ICD-10-CM | POA: Diagnosis not present

## 2016-06-12 DIAGNOSIS — Z79899 Other long term (current) drug therapy: Secondary | ICD-10-CM | POA: Diagnosis not present

## 2016-06-12 DIAGNOSIS — I110 Hypertensive heart disease with heart failure: Secondary | ICD-10-CM | POA: Insufficient documentation

## 2016-06-12 DIAGNOSIS — I2510 Atherosclerotic heart disease of native coronary artery without angina pectoris: Secondary | ICD-10-CM | POA: Diagnosis not present

## 2016-06-12 DIAGNOSIS — I82402 Acute embolism and thrombosis of unspecified deep veins of left lower extremity: Secondary | ICD-10-CM | POA: Insufficient documentation

## 2016-06-12 DIAGNOSIS — M7989 Other specified soft tissue disorders: Secondary | ICD-10-CM | POA: Diagnosis present

## 2016-06-12 DIAGNOSIS — E119 Type 2 diabetes mellitus without complications: Secondary | ICD-10-CM | POA: Diagnosis not present

## 2016-06-12 DIAGNOSIS — Z7982 Long term (current) use of aspirin: Secondary | ICD-10-CM | POA: Diagnosis not present

## 2016-06-12 LAB — COMPREHENSIVE METABOLIC PANEL
ALT: 59 U/L — ABNORMAL HIGH (ref 14–54)
AST: 24 U/L (ref 15–41)
Albumin: 3.2 g/dL — ABNORMAL LOW (ref 3.5–5.0)
Alkaline Phosphatase: 150 U/L — ABNORMAL HIGH (ref 38–126)
Anion gap: 7 (ref 5–15)
BILIRUBIN TOTAL: 0.9 mg/dL (ref 0.3–1.2)
BUN: 21 mg/dL — AB (ref 6–20)
CO2: 26 mmol/L (ref 22–32)
Calcium: 8.3 mg/dL — ABNORMAL LOW (ref 8.9–10.3)
Chloride: 99 mmol/L — ABNORMAL LOW (ref 101–111)
Creatinine, Ser: 1.11 mg/dL — ABNORMAL HIGH (ref 0.44–1.00)
GFR, EST AFRICAN AMERICAN: 55 mL/min — AB (ref >60)
GFR, EST NON AFRICAN AMERICAN: 47 mL/min — AB (ref >60)
Glucose, Bld: 224 mg/dL — ABNORMAL HIGH (ref 65–99)
POTASSIUM: 3.9 mmol/L (ref 3.5–5.1)
Sodium: 132 mmol/L — ABNORMAL LOW (ref 135–145)
TOTAL PROTEIN: 6.2 g/dL — AB (ref 6.5–8.1)

## 2016-06-12 LAB — APTT: aPTT: 31 seconds (ref 24–36)

## 2016-06-12 LAB — PROTIME-INR
INR: 1.22
Prothrombin Time: 15.5 seconds — ABNORMAL HIGH (ref 11.4–15.2)

## 2016-06-12 LAB — CBC
HCT: 36.3 % (ref 35.0–47.0)
Hemoglobin: 11.1 g/dL — ABNORMAL LOW (ref 12.0–16.0)
MCH: 23.9 pg — AB (ref 26.0–34.0)
MCHC: 30.6 g/dL — AB (ref 32.0–36.0)
MCV: 78.1 fL — AB (ref 80.0–100.0)
Platelets: 207 10*3/uL (ref 150–440)
RBC: 4.65 MIL/uL (ref 3.80–5.20)
RDW: 16.6 % — AB (ref 11.5–14.5)
WBC: 12.4 10*3/uL — AB (ref 3.6–11.0)

## 2016-06-12 LAB — TROPONIN I
TROPONIN I: 0.03 ng/mL — AB (ref <0.03)
Troponin I: 0.03 ng/mL (ref <0.03)

## 2016-06-12 LAB — BRAIN NATRIURETIC PEPTIDE: B Natriuretic Peptide: 3936 pg/mL — ABNORMAL HIGH (ref 0.0–100.0)

## 2016-06-12 MED ORDER — MOMETASONE FURO-FORMOTEROL FUM 200-5 MCG/ACT IN AERO
2.0000 | INHALATION_SPRAY | Freq: Two times a day (BID) | RESPIRATORY_TRACT | Status: DC
  Administered 2016-06-13: 2 via RESPIRATORY_TRACT
  Filled 2016-06-12: qty 8.8

## 2016-06-12 MED ORDER — METOPROLOL TARTRATE 50 MG PO TABS
100.0000 mg | ORAL_TABLET | Freq: Three times a day (TID) | ORAL | Status: DC
  Administered 2016-06-12 – 2016-06-13 (×2): 100 mg via ORAL
  Filled 2016-06-12 (×3): qty 2

## 2016-06-12 MED ORDER — RIVAROXABAN 20 MG PO TABS: 20.0000 mg | ORAL_TABLET | Freq: Every day | ORAL | Status: DC

## 2016-06-12 MED ORDER — GLIPIZIDE 5 MG PO TABS
2.5000 mg | ORAL_TABLET | Freq: Every day | ORAL | Status: DC
  Administered 2016-06-13: 2.5 mg via ORAL
  Filled 2016-06-12: qty 0.5

## 2016-06-12 MED ORDER — DILTIAZEM HCL ER COATED BEADS 180 MG PO CP24
180.0000 mg | ORAL_CAPSULE | Freq: Every day | ORAL | Status: DC
  Administered 2016-06-12 – 2016-06-13 (×2): 180 mg via ORAL
  Filled 2016-06-12 (×2): qty 1

## 2016-06-12 MED ORDER — OXYCODONE-ACETAMINOPHEN 5-325 MG PO TABS
1.0000 | ORAL_TABLET | ORAL | Status: AC
Start: 2016-06-12 — End: 2016-06-12
  Administered 2016-06-12: 1 via ORAL
  Filled 2016-06-12: qty 1

## 2016-06-12 MED ORDER — DILTIAZEM HCL ER COATED BEADS 180 MG PO CP24: 180.0000 mg | ORAL_CAPSULE | Freq: Every day | ORAL | Status: DC

## 2016-06-12 MED ORDER — RIVAROXABAN 15 MG PO TABS
15.0000 mg | ORAL_TABLET | Freq: Two times a day (BID) | ORAL | Status: DC
  Administered 2016-06-12 – 2016-06-13 (×2): 15 mg via ORAL
  Filled 2016-06-12 (×3): qty 1

## 2016-06-12 MED ORDER — FUROSEMIDE 10 MG/ML IJ SOLN
40.0000 mg | Freq: Once | INTRAMUSCULAR | Status: AC
  Administered 2016-06-12: 40 mg via INTRAVENOUS
  Filled 2016-06-12: qty 4

## 2016-06-12 MED ORDER — FUROSEMIDE 40 MG PO TABS
20.0000 mg | ORAL_TABLET | Freq: Every morning | ORAL | Status: DC
  Administered 2016-06-13: 20 mg via ORAL
  Filled 2016-06-12: qty 1

## 2016-06-12 MED ORDER — FUROSEMIDE 40 MG PO TABS
60.0000 mg | ORAL_TABLET | Freq: Once | ORAL | Status: AC
  Administered 2016-06-12: 60 mg via ORAL
  Filled 2016-06-12: qty 2

## 2016-06-12 MED ORDER — POTASSIUM CHLORIDE CRYS ER 20 MEQ PO TBCR
40.0000 meq | EXTENDED_RELEASE_TABLET | Freq: Once | ORAL | Status: AC
  Administered 2016-06-12: 40 meq via ORAL
  Filled 2016-06-12: qty 2

## 2016-06-12 NOTE — ED Notes (Signed)
Pt is anxious and crying in Ultrasound.

## 2016-06-12 NOTE — ED Notes (Signed)
Pharm called to send home meds to ED

## 2016-06-12 NOTE — Progress Notes (Signed)
ANTICOAGULATION CONSULT NOTE - Initial Consult  Pharmacy Consult for Xarelto Indication: DVT  Allergies  Allergen Reactions  . Morphine And Related Other (See Comments)    Patient states it makes her "space out"    Patient Measurements: Height: 5\' 4"  (162.6 cm) Weight: 140 lb (63.5 kg) IBW/kg (Calculated) : 54.7 Heparin Dosing Weight:   Vital Signs: Temp: 97.5 F (36.4 C) (04/03 1420) Temp Source: Oral (04/03 1420) BP: 203/76 (04/03 1420) Pulse Rate: 83 (04/03 1420)  Labs:  Recent Labs  06/10/16 0017 06/10/16 0320 06/12/16 1435 06/12/16 1854  HGB 11.4*  --  11.1*  --   HCT 37.2  --  36.3  --   PLT 213  --  207  --   APTT  --   --   --  31  LABPROT  --   --   --  15.5*  INR  --   --   --  1.22  CREATININE 1.30*  --  1.11*  --   TROPONINI <0.03 <0.03 0.03*  --     Estimated Creatinine Clearance: 37.8 mL/min (A) (by C-G formula based on SCr of 1.11 mg/dL (H)).   Medical History: Past Medical History:  Diagnosis Date  . Afib (HCC)   . Anxiety   . Asthma   . CHF (congestive heart failure) (HCC)   . Coronary artery disease   . Diabetes mellitus without complication (HCC)   . Hyperlipidemia   . Hypertension     Medications:  Infusions:    Assessment: 75 yof cc leg swelling. Was recently seen in ED on 06/02/16 and found to have "nonocclusive appearing venous thrombosis.... In the greater saphenous vein.... With extension to the saphenous femoral junction on the left. While no thrombus is seen currently in the deep system on the left, there is high risk of propagation of thrombus from the saphenous femoral junction on the left into the left common femoral vein and possibly beyond."  Today ultrasound shows "persistent nonocclusive thrombus within the left saphenofemoral junction and superficial great saphenous vein, with new nonocclusive thrombus within the left common femoral vein."  Pharmacy is consulted to dose Xarelto for DVT. Patient takes Xarelto at home  for AF at 15 mg po daily. Will increase dose to 15 mg po BID with meals x 21 days followed by 20 mg po daily with supper. CrCL approximately 35 to 40 mL/min today.   Goal of Therapy:   Monitor platelets by anticoagulation protocol: Yes   Plan:  Xarelto 15 mg po BID with meals x 21 days followed by Xarelto 20 mg po daily with supper. No dose adjustment for renal function required for this indication. No significant drug-drug interactions were identified from PTA med list.   Carola Frost, Pharm.D., BCPS Clinical Pharmacist 06/12/2016,7:40 PM

## 2016-06-12 NOTE — ED Triage Notes (Signed)
Pt comes into the ED via EMS from home with c/o BL LE swelling,. Pt was here 2 days ago for HTN and leg swelling.. States she has been taking the fluids pills as instructed with no change.Marland Kitchen

## 2016-06-12 NOTE — ED Provider Notes (Addendum)
[facility] Emergency Department Provider Note ____________________________________________   First MD Initiated Contact with Patient 06/12/16 1709     (approximate)  I have reviewed the triage vital signs and the nursing notes.   HISTORY  Chief Complaint Leg Swelling  HPI Robin [name] is a 76 y.o. female history of congestive heart failure, A. fib, on Xarelto recent admission for abdominal pain  Patient presents today reports last 3 days she noted significant swelling in her legs, she has some swelling all the time but today swelling has gone up to both knees. She reports that she missed a couple doses of her fluid pills over the last few days, but has them at home which forgot to take them She is still taking her blood pressure medicine  No chest pain or trouble breathing. No nausea or vomiting. No further abdominal pain fever or chills. She reports her legs are painful and feel "tight" from the fluid. She does report this has happened before  No redness or rash  Past Medical History:  Diagnosis Date  . Afib (HCC)   . Anxiety   . Asthma   . CHF (congestive heart failure) (HCC)   . Coronary artery disease   . Diabetes mellitus without complication (HCC)   . Hyperlipidemia   . Hypertension     Patient Active Problem List   Diagnosis Date Noted  . Abdominal pain 06/07/2016  . Transaminitis   . Atrial fibrillation with RVR (HCC) 05/03/2016  . Demand ischemia (HCC) 05/03/2016  . Moderate aortic stenosis 05/03/2016  . Moderate mitral regurgitation 05/03/2016  . History of noncompliance with medical treatment 05/03/2016  . Congestive dilated cardiomyopathy (HCC) 05/03/2016  . Chest pain 05/02/2016  . 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 Renal artery stenosis (HCC) 02/28/2016  . Accelerated hypertension 02/28/2016  . Mild dementia 02/28/2016  . Noncompliance 02/07/2016  . Renal infarct (HCC) 02/06/2016  . Atrial fibrillation with rapid  ventricular response (HCC) 02/06/2016  . A-fib (HCC) 01/24/2016  . HTN (hypertension) 11/04/2015  . HLD (hyperlipidemia) 11/04/2015  . CAD (coronary artery disease) 11/04/2015  . Chronic systolic CHF (congestive heart failure) (HCC) 11/04/2015  . Diabetes (HCC) 11/04/2015    Past Surgical History:  Procedure Laterality Date  . CORONARY ARTERY BYPASS GRAFT    . NO PAST SURGERIES    . PERIPHERAL VASCULAR CATHETERIZATION Left 03/02/2016   Procedure: Renal Intervention;  Surgeon: Renford Dills, MD;  Location: [facility] INVASIVE CV LAB;  Service: Cardiovascular;  Laterality: Left;    Prior to Admission medications   Medication Sig Start Date End Date Taking? Authorizing Provider  albuterol (ACCUNEB) 1.25 MG/3ML nebulizer solution Take 1.25 mg by nebulization every 4 (four) hours as needed for wheezing or shortness of breath.    Yes Historical Provider, MD  aspirin 81 MG chewable tablet Chew 81 mg by mouth daily. 04/13/16  Yes Historical Provider, MD  atorvastatin (LIPITOR) 80 MG tablet Take 1 tablet (80 mg total) by mouth daily at 6 PM. 03/05/16  Yes Enedina Finner, MD  diltiazem (CARDIZEM CD) 180 MG 24 hr capsule Take 1 capsule (180 mg total) by mouth daily. 03/05/16  Yes Enedina Finner, MD  furosemide (LASIX) 20 MG tablet Take 20 mg by mouth every morning.   Yes Historical Provider, MD  glipiZIDE (GLUCOTROL) 5 MG tablet Take 0.5 tablets (2.5 mg total) by mouth daily before breakfast. 03/06/16  Yes Enedina Finner, MD  LORazepam (ATIVAN) 1 MG tablet Take 1 tablet (1 mg total)  by mouth 2 (two) times daily. 05/05/16 05/05/17 Yes Emily Filbert, MD  meclizine (ANTIVERT) 25 MG tablet Take 1 tablet (25 mg total) by mouth 3 (three) times daily as needed for dizziness. 11/06/15  Yes Enid Baas, MD  metoprolol (LOPRESSOR) 100 MG tablet Take 1 tablet (100 mg total) by mouth 3 (three) times daily. 06/09/16  Yes Ramonita Lab, MD  mometasone-formoterol (DULERA) 200-5 MCG/ACT AERO Inhale 2 puffs into the lungs 2  (two) times daily. 03/05/16  Yes Enedina Finner, MD  nitroGLYCERIN (NITROSTAT) 0.4 MG SL tablet Place 1 tablet (0.4 mg total) under the tongue every 5 (five) minutes as needed for chest pain. 01/25/16  Yes Sital Mody, MD  ondansetron (ZOFRAN ODT) 4 MG disintegrating tablet Take 1 tablet (4 mg total) by mouth every 8 (eight) hours as needed for nausea or vomiting. 11/06/15  Yes Enid Baas, MD  pantoprazole (PROTONIX) 40 MG tablet Take 1 tablet (40 mg total) by mouth daily. Patient taking differently: Take 40 mg by mouth daily as needed. For acid reflux 11/06/15  Yes Enid Baas, MD  rivaroxaban (XARELTO) 15 MG TABS tablet Take 1 tablet (15 mg total) by mouth daily with supper. 06/02/16  Yes Nita Sickle, MD  oxyCODONE-acetaminophen (PERCOCET/ROXICET) 5-325 MG tablet Take 1 tablet by mouth every 4 (four) hours as needed for moderate pain or severe pain. Patient not taking: Reported on 06/02/2016 03/05/16   Enedina Finner, MD    Allergies Morphine and related  Family History  Problem Relation Age of Onset  . Hypertension Mother   . Heart failure Mother   . Heart disease Father     Social History Social History  Substance Use Topics  . Smoking status: Never Smoker  . Smokeless tobacco: Never Used  . Alcohol use No    Review of Systems Constitutional: No fever/chills Eyes: No visual changes. ENT: No sore throat. Cardiovascular: Denies chest pain. Respiratory: Denies shortness of breath. Gastrointestinal: No abdominal pain, This is gone away that she had abdominal pain last week which is better.  No nausea, no vomiting.  No diarrhea.  No constipation. Genitourinary: Negative for dysuria. Musculoskeletal: Negative for back pain.See history of present illness Skin: Negative for rash. Neurological: Negative for headaches, focal weakness or numbness.  10-point ROS otherwise negative.  ____________________________________________   PHYSICAL EXAM:  VITAL SIGNS: ED Triage  Vitals  Enc Vitals Group     BP 06/12/16 1420 (!) 203/76     Pulse Rate 06/12/16 1420 83     Resp 06/12/16 1420 18     Temp 06/12/16 1420 97.5 F (36.4 C)     Temp Source 06/12/16 1420 Oral     SpO2 06/12/16 1417 98 %     Weight 06/12/16 1421 140 lb (63.5 kg)     Height 06/12/16 1421 5\' 4"  (1.626 m)     Head Circumference --      Peak Flow --      Pain Score 06/12/16 1420 10     Pain Loc --      Pain Edu? --      Excl. in GC? --     Constitutional: Alert and oriented. Well appearing and in no acute distress. Eyes: Conjunctivae are normal. PERRL. EOMI. Head: Atraumatic. Nose: No congestion/rhinnorhea. Mouth/Throat: Mucous membranes are moist.  Oropharynx non-erythematous. Neck: No stridor.   Cardiovascular: Normal rate, regular rhythm. Grossly normal heart sounds.  Good peripheral circulation. Respiratory: Normal respiratory effort.  No retractions. Lungs CTAB. Gastrointestinal: Soft and nontender. No  distention.  Musculoskeletal: No lower extremity tenderness or venous cords/congestion, but she does have pitting edema to the knees bilaterally, slightly more notable in the left leg than the right.  No joint effusions. Neurologic:  Normal speech and language. No gross focal neurologic deficits are appreciated. No gait instability. Skin:  Skin is warm, dry and intact. No rash noted. Psychiatric: Mood and affect are normal. Speech and behavior are normal.  ____________________________________________   LABS (all labs ordered are listed, but only abnormal results are displayed)  Labs Reviewed  CBC - Abnormal; Notable for the following:       Result Value   WBC 12.4 (*)    Hemoglobin 11.1 (*)    MCV 78.1 (*)    MCH 23.9 (*)    MCHC 30.6 (*)    RDW 16.6 (*)    All other components within normal limits  COMPREHENSIVE METABOLIC PANEL - Abnormal; Notable for the following:    Sodium 132 (*)    Chloride 99 (*)    Glucose, Bld 224 (*)    BUN 21 (*)    Creatinine, Ser 1.11 (*)     Calcium 8.3 (*)    Total Protein 6.2 (*)    Albumin 3.2 (*)    ALT 59 (*)    Alkaline Phosphatase 150 (*)    GFR calc non Af Amer 47 (*)    GFR calc Af Amer 55 (*)    All other components within normal limits  TROPONIN I - Abnormal; Notable for the following:    Troponin I 0.03 (*)    All other components within normal limits  BRAIN NATRIURETIC PEPTIDE - Abnormal; Notable for the following:    B Natriuretic Peptide 3,936.0 (*)    All other components within normal limits  PROTIME-INR - Abnormal; Notable for the following:    Prothrombin Time 15.5 (*)    All other components within normal limits  TROPONIN I - Abnormal; Notable for the following:    Troponin I 0.03 (*)    All other components within normal limits  APTT   ____________________________________________  EKG  Reviewed and interpreted by me at 1930 Ventricular rate 70 Care is 100 QTc 450 Normal sinus rhythm, evidence of LVH with probable slight repolarization abnormality  No oevidence of acute ST elevation noted ____________________________________________  RADIOLOGY  Dg Chest 2 View  Result Date: 06/12/2016 CLINICAL DATA:  Lower extremity edema EXAM: CHEST  2 VIEW COMPARISON:  June 10, 2016 FINDINGS: There is no edema or consolidation. There is mild cardiomegaly with pulmonary vascularity within normal limits. Patient is status post coronary artery bypass grafting. No adenopathy. There is aortic atherosclerosis. There is mild degenerative change in the thoracic spine. IMPRESSION: No edema or consolidation. Mild cardiomegaly. Status post coronary artery bypass grafting. There is aortic atherosclerosis. Electronically Signed   By: Bretta Bang III M.D.   On: 06/12/2016 17:59   US Venous Img Lower Bilateral  Result Date: 06/12/2016 CLINICAL DATA:  Initial evaluation for lower extremity edema, evaluate for DVT. EXAM: BILATERAL LOWER EXTREMITY VENOUS DOPPLER ULTRASOUND TECHNIQUE: Gray-scale sonography with  graded compression, as well as color Doppler and duplex ultrasound were performed to evaluate the lower extremity deep venous systems from the level of the common femoral vein and including the common femoral, femoral, profunda femoral, popliteal and calf veins including the posterior tibial, peroneal and gastrocnemius veins when visible. The superficial great saphenous vein was also interrogated. Spectral Doppler was utilized to evaluate flow at rest and  with distal augmentation maneuvers in the common femoral, femoral and popliteal veins. COMPARISON:  Prior ultrasound from 06/05/2016. FINDINGS: RIGHT LOWER EXTREMITY Common Femoral Vein: No evidence of thrombus. Normal compressibility, respiratory phasicity and response to augmentation. Saphenofemoral Junction: No evidence of thrombus. Normal compressibility and flow on color Doppler imaging. Profunda Femoral Vein: No evidence of thrombus. Normal compressibility and flow on color Doppler imaging. Femoral Vein: No evidence of thrombus. Normal compressibility, respiratory phasicity and response to augmentation. Popliteal Vein: No evidence of thrombus. Normal compressibility, respiratory phasicity and response to augmentation. Calf Veins: No evidence of thrombus. Normal compressibility and flow on color Doppler imaging. Superficial Great Saphenous Vein: No evidence of thrombus. Normal compressibility and flow on color Doppler imaging. Venous Reflux:  None. Other Findings:  None. LEFT LOWER EXTREMITY Common Femoral Vein: Nonocclusive thrombus now evident within the left common femoral vein. Loss of normal compressibility. Saphenofemoral Junction: Nonocclusive thrombus present at the saphenous femoral junction. Loss of normal compressibility. This is similar to previous. Profunda Femoral Vein: No evidence of thrombus. Normal compressibility and flow on color Doppler imaging. Femoral Vein: No evidence of thrombus. Normal compressibility, respiratory phasicity and  response to augmentation. Popliteal Vein: No evidence of thrombus. Normal compressibility, respiratory phasicity and response to augmentation. Calf Veins: No evidence of thrombus. Normal compressibility and flow on color Doppler imaging. Superficial Great Saphenous Vein: Nonocclusive thrombus again noted within the great saphenous vein. Again, this is noncompressible. Venous Reflux:  None. Other Findings:  None. IMPRESSION: 1. Persistent nonocclusive thrombus within the left saphenofemoral junction and superficial great saphenous vein, with new nonocclusive thrombus within the left common femoral vein. 2. No evidence for DVT within the right lower extremity. Electronically Signed   By: Rise Mu M.D.   On: 06/12/2016 19:09    ____________________________________________   PROCEDURES  Procedure(s) performed: None  Procedures  Critical Care performed: No  ____________________________________________   INITIAL IMPRESSION / ASSESSMENT AND PLAN / ED COURSE  Pertinent labs & imaging results that were available during my care of the patient were reviewed by me and considered in my medical decision making (see chart for details).  Patient presents for leg discomfort and notable peripheral edema. She has palpable dorsalis pedis pulses bilaterally, no evidence of distal ischemia. She has notable edema in the lower extremities, with a history of systolic heart failure. She does report some noncompliance with her Lasix as she forgot to take doses last couple days. Appears most consistent with increased pedal edema, she is not short of breath does not have any hypoxia, but her blood pressure is notably elevated as well. I suspect volume overload. Denies any symptoms of acute cardiac or pulmonary disease. No further abdominal pain nausea vomiting or fevers.  We'll administer a additional dose of Lasix, giving her trouble dose this evening. I did tell her and willing to give her a single dose of  Percocet as she reports pain in the legs, but did advise her that would not be able to provide her a prescription to take it home as this needs to come through her primary suspect for leg pain will actually improve once fluid is removed.   ----------------------------------------- 7:26 PM on 06/12/2016 -----------------------------------------  Case discussed with Dr. Wyn Quaker. Then discussed with the patient, turns out due to financial issues she has not been able to afford many of her medications including her Xarelto. I have ordered consultation to place her back on anticoagulation for DVT. Discussed the case with Dr. Elpidio Anis of the hospitalist service  will see and evaluate the patient for further workup and disposition planning. Also need assistance securing some type of financial or medication assistance to get her prescribed medication  Clinical Course as of Jun 13 2346  Tue Jun 12, 2016  2341 Assuming care from Dr. Fanny Bien.  In short, Robin [name] is a 76 y.o. female with a chief complaint of peripheral edema and DVT.  Refer to the original H&P for additional details.  The patient had a hospitalist consult already and they do not feel she needs admission.  The current plan of care is to follow up with case management for medical assistance.   [CF]    Clinical Course User Index [CF] Loleta Rose, MD   Patient being admitted after being consulted by the  hospitalist service  ____________________________________________   FINAL CLINICAL IMPRESSION(S) / ED DIAGNOSES  Final diagnoses:  Deep vein thrombosis (DVT) of femoral vein of left lower extremity, unspecified chronicity (HCC)  Peripheral edema  Non compliance w medication regimen      NEW MEDICATIONS STARTED DURING THIS VISIT:  New Prescriptions   No medications on file     Note:  This document was prepared using Dragon voice recognition software and may include unintentional dictation errors.     Sharyn Creamer,  MD 06/12/16 2013   ----------------------------------------- 9:11 PM on 06/12/2016 -----------------------------------------  Revision, patient seen by the hospitalist who is written for 6 additional medications for her and recommends that she be In boarded in the emergency department tonight for case management consult as she is unable to get medications which she requires. Hospitalist service does not feel the patient requires admission at this time, and I would agree with this plan. The case management consult has been placed by the hospitalist service. Ongoing care assigned to Dr. York Cerise at 11:30 PM  Patient is agreeable and understanding with plan as well.    Sharyn Creamer, MD 06/12/16 [phone]

## 2016-06-12 NOTE — ED Notes (Signed)
Meal tray given at this time 

## 2016-06-12 NOTE — ED Notes (Addendum)
Pt c/o bilat swelling to lower extremities x3 days and pain that has decreased her ambualtion. Pt hx of CHF, denies any noncompliance in meds. Pt A&Ox4

## 2016-06-12 NOTE — Consult Note (Signed)
[employer] - [place] [facility]   PATIENT NAME: Robin [name]    MR#:  [id number]  DATE OF BIRTH:  October 26, 1940  DATE OF ADMISSION:  06/12/2016  PRIMARY CARE PHYSICIAN: Leanna Sato, MD   CONSULT REQUESTING/REFERRING PHYSICIAN: Dr. Winnifred Friar  REASON FOR CONSULT: DVT and CHF  CHIEF COMPLAINT:   Chief Complaint  Patient presents with  . Leg Swelling    HISTORY OF PRESENT ILLNESS:  Robin [name]  is a 76 y.o. female with a known history of Chronic systolic CHF with ejection fraction 25-30%, left lower extremity DVT, atrial fibrillation, hypertension, noncompliance presents to the emergency room complaining of lower extremity swelling and weakness. Patient has not been taking Xarelto due to financial reasons. Also noncompliant with medications recurrent admissions. She has mild worsening of lower extremity edema. Chest x-ray shows no pulmonary edema. Lower extremity Dopplers showed extension of her nonocclusive left lower extremity DVT. Patient has not taken her Xarelto in 2 weeks.  PAST MEDICAL HISTORY:   Past Medical History:  Diagnosis Date  . Afib (HCC)   . Anxiety   . Asthma   . CHF (congestive heart failure) (HCC)   . Coronary artery disease   . Diabetes mellitus without complication (HCC)   . Hyperlipidemia   . Hypertension     PAST SURGICAL HISTOIRY:   Past Surgical History:  Procedure Laterality Date  . CORONARY ARTERY BYPASS GRAFT    . NO PAST SURGERIES    . PERIPHERAL VASCULAR CATHETERIZATION Left 03/02/2016   Procedure: Renal Intervention;  Surgeon: Renford Dills, MD;  Location: [facility] INVASIVE CV LAB;  Service: Cardiovascular;  Laterality: Left;    SOCIAL HISTORY:   Social History  Substance Use Topics  . Smoking status: Never Smoker  . Smokeless tobacco: Never Used  . Alcohol use No    FAMILY HISTORY:   Family History  Problem Relation Age of Onset  . Hypertension Mother   . Heart failure Mother   . Heart disease Father      DRUG ALLERGIES:   Allergies  Allergen Reactions  . Morphine And Related Other (See Comments)    Patient states it makes her "space out"    REVIEW OF SYSTEMS:   ROS  CONSTITUTIONAL: Complains of fatigue EYES: No blurred or double vision.  EARS, NOSE, AND THROAT: No tinnitus or ear pain.  RESPIRATORY: No cough, shortness of breath, wheezing or hemoptysis.  CARDIOVASCULAR: No chest pain, orthopnea. Lower extremity edema GASTROINTESTINAL: No nausea, vomiting, diarrhea or abdominal pain.  GENITOURINARY: No dysuria, hematuria.  ENDOCRINE: No polyuria, nocturia,  HEMATOLOGY: No anemia, easy bruising or bleeding SKIN: No rash or lesion. MUSCULOSKELETAL: No joint pain or arthritis.   NEUROLOGIC: No tingling, numbness, weakness.  PSYCHIATRY: No anxiety or depression.   MEDICATIONS AT HOME:   Prior to Admission medications   Medication Sig Start Date End Date Taking? Authorizing Provider  albuterol (ACCUNEB) 1.25 MG/3ML nebulizer solution Take 1.25 mg by nebulization every 4 (four) hours as needed for wheezing or shortness of breath.    Yes Historical Provider, MD  aspirin 81 MG chewable tablet Chew 81 mg by mouth daily. 04/13/16  Yes Historical Provider, MD  atorvastatin (LIPITOR) 80 MG tablet Take 1 tablet (80 mg total) by mouth daily at 6 PM. 03/05/16  Yes Enedina Finner, MD  diltiazem (CARDIZEM CD) 180 MG 24 hr capsule Take 1 capsule (180 mg total) by mouth daily. 03/05/16  Yes Enedina Finner, MD  furosemide (LASIX) 20 MG tablet Take 20  mg by mouth every morning.   Yes Historical Provider, MD  glipiZIDE (GLUCOTROL) 5 MG tablet Take 0.5 tablets (2.5 mg total) by mouth daily before breakfast. 03/06/16  Yes Enedina Finner, MD  LORazepam (ATIVAN) 1 MG tablet Take 1 tablet (1 mg total) by mouth 2 (two) times daily. 05/05/16 05/05/17 Yes Emily Filbert, MD  meclizine (ANTIVERT) 25 MG tablet Take 1 tablet (25 mg total) by mouth 3 (three) times daily as needed for dizziness. 11/06/15  Yes Enid Baas, MD  metoprolol (LOPRESSOR) 100 MG tablet Take 1 tablet (100 mg total) by mouth 3 (three) times daily. 06/09/16  Yes Ramonita Lab, MD  mometasone-formoterol (DULERA) 200-5 MCG/ACT AERO Inhale 2 puffs into the lungs 2 (two) times daily. 03/05/16  Yes Enedina Finner, MD  nitroGLYCERIN (NITROSTAT) 0.4 MG SL tablet Place 1 tablet (0.4 mg total) under the tongue every 5 (five) minutes as needed for chest pain. 01/25/16  Yes Sital Mody, MD  ondansetron (ZOFRAN ODT) 4 MG disintegrating tablet Take 1 tablet (4 mg total) by mouth every 8 (eight) hours as needed for nausea or vomiting. 11/06/15  Yes Enid Baas, MD  pantoprazole (PROTONIX) 40 MG tablet Take 1 tablet (40 mg total) by mouth daily. Patient taking differently: Take 40 mg by mouth daily as needed. For acid reflux 11/06/15  Yes Enid Baas, MD  rivaroxaban (XARELTO) 15 MG TABS tablet Take 1 tablet (15 mg total) by mouth daily with supper. 06/02/16  Yes Nita Sickle, MD  oxyCODONE-acetaminophen (PERCOCET/ROXICET) 5-325 MG tablet Take 1 tablet by mouth every 4 (four) hours as needed for moderate pain or severe pain. Patient not taking: Reported on 06/02/2016 03/05/16   Enedina Finner, MD      VITAL SIGNS:  Blood pressure (!) 203/76, pulse 83, temperature 97.5 F (36.4 C), temperature source Oral, resp. rate 18, height 5\' 4"  (1.626 m), weight 63.5 kg (140 lb), SpO2 99 %.  PHYSICAL EXAMINATION:  GENERAL:  76 y.o.-year-old patient lying in the bed with no acute distress.  EYES: Pupils equal, round, reactive to light and accommodation. No scleral icterus. Extraocular muscles intact.  HEENT: Head atraumatic, normocephalic. Oropharynx and nasopharynx clear.  NECK:  Supple, no jugular venous distention. No thyroid enlargement, no tenderness.  LUNGS: Normal breath sounds bilaterally, no wheezing, rales,rhonchi or crepitation. No use of accessory muscles of respiration.  CARDIOVASCULAR: S1, S2 normal. No murmurs, rubs, or gallops.   ABDOMEN: Soft, nontender, nondistended. Bowel sounds present. No organomegaly or mass.  EXTREMITIES: No cyanosis, or clubbing. Bilateral lower extremity edema NEUROLOGIC: Cranial nerves II through XII are intact. Muscle strength 5/5 in all extremities. Sensation intact. Gait not checked.  PSYCHIATRIC: The patient is alert and oriented x 3.  SKIN: No obvious rash, lesion, or ulcer.   LABORATORY PANEL:   CBC  Recent Labs Lab 06/12/16 1435  WBC 12.4*  HGB 11.1*  HCT 36.3  PLT 207   ------------------------------------------------------------------------------------------------------------------  Chemistries   Recent Labs Lab 06/12/16 1435  NA 132*  K 3.9  CL 99*  CO2 26  GLUCOSE 224*  BUN 21*  CREATININE 1.11*  CALCIUM 8.3*  AST 24  ALT 59*  ALKPHOS 150*  BILITOT 0.9   ------------------------------------------------------------------------------------------------------------------  Cardiac Enzymes  Recent Labs Lab 06/12/16 1435  TROPONINI 0.03*   ------------------------------------------------------------------------------------------------------------------  RADIOLOGY:  Dg Chest 2 View  Result Date: 06/12/2016 CLINICAL DATA:  Lower extremity edema EXAM: CHEST  2 VIEW COMPARISON:  June 10, 2016 FINDINGS: There is no edema or consolidation. There is mild  cardiomegaly with pulmonary vascularity within normal limits. Patient is status post coronary artery bypass grafting. No adenopathy. There is aortic atherosclerosis. There is mild degenerative change in the thoracic spine. IMPRESSION: No edema or consolidation. Mild cardiomegaly. Status post coronary artery bypass grafting. There is aortic atherosclerosis. Electronically Signed   By: Bretta Bang III M.D.   On: 06/12/2016 17:59   US Venous Img Lower Bilateral  Result Date: 06/12/2016 CLINICAL DATA:  Initial evaluation for lower extremity edema, evaluate for DVT. EXAM: BILATERAL LOWER EXTREMITY VENOUS DOPPLER  ULTRASOUND TECHNIQUE: Gray-scale sonography with graded compression, as well as color Doppler and duplex ultrasound were performed to evaluate the lower extremity deep venous systems from the level of the common femoral vein and including the common femoral, femoral, profunda femoral, popliteal and calf veins including the posterior tibial, peroneal and gastrocnemius veins when visible. The superficial great saphenous vein was also interrogated. Spectral Doppler was utilized to evaluate flow at rest and with distal augmentation maneuvers in the common femoral, femoral and popliteal veins. COMPARISON:  Prior ultrasound from 06/05/2016. FINDINGS: RIGHT LOWER EXTREMITY Common Femoral Vein: No evidence of thrombus. Normal compressibility, respiratory phasicity and response to augmentation. Saphenofemoral Junction: No evidence of thrombus. Normal compressibility and flow on color Doppler imaging. Profunda Femoral Vein: No evidence of thrombus. Normal compressibility and flow on color Doppler imaging. Femoral Vein: No evidence of thrombus. Normal compressibility, respiratory phasicity and response to augmentation. Popliteal Vein: No evidence of thrombus. Normal compressibility, respiratory phasicity and response to augmentation. Calf Veins: No evidence of thrombus. Normal compressibility and flow on color Doppler imaging. Superficial Great Saphenous Vein: No evidence of thrombus. Normal compressibility and flow on color Doppler imaging. Venous Reflux:  None. Other Findings:  None. LEFT LOWER EXTREMITY Common Femoral Vein: Nonocclusive thrombus now evident within the left common femoral vein. Loss of normal compressibility. Saphenofemoral Junction: Nonocclusive thrombus present at the saphenous femoral junction. Loss of normal compressibility. This is similar to previous. Profunda Femoral Vein: No evidence of thrombus. Normal compressibility and flow on color Doppler imaging. Femoral Vein: No evidence of thrombus. Normal  compressibility, respiratory phasicity and response to augmentation. Popliteal Vein: No evidence of thrombus. Normal compressibility, respiratory phasicity and response to augmentation. Calf Veins: No evidence of thrombus. Normal compressibility and flow on color Doppler imaging. Superficial Great Saphenous Vein: Nonocclusive thrombus again noted within the great saphenous vein. Again, this is noncompressible. Venous Reflux:  None. Other Findings:  None. IMPRESSION: 1. Persistent nonocclusive thrombus within the left saphenofemoral junction and superficial great saphenous vein, with new nonocclusive thrombus within the left common femoral vein. 2. No evidence for DVT within the right lower extremity. Electronically Signed   By: Rise Mu M.D.   On: 06/12/2016 19:09    EKG:   Orders placed or performed during the hospital encounter of 06/12/16  . ED EKG  . ED EKG  . EKG 12-Lead  . EKG 12-Lead    IMPRESSION AND PLAN:   * Left lower extremity nonocclusive DVT There is some progression of the DVT. Patient has not been compliant with Xarelto. Start Xarelto.  Placed a consult for case management to help with medications.  * Acute on chronic systolic CHF Patient has anasarca. Chest x-ray shows no pulmonary edema. Due to noncompliance. We'll give her IV dose of Lasix. Counseled regarding fluid and salt restriction. Added referral to heart failure clinic.  * Paroxysmal atrial fibrillation. Continue Cardizem and metoprolol. Patient has been restarted on Xarelto.  * Diabetes mellitus. Patient on  glipizide.  Patient cannot afford DOACs. She needs to be started on warfarin.   All the records are reviewed and case discussed with Consulting provider. Management plans discussed with the patient, family and they are in agreement.  CODE STATUS: FULL CODE  TOTAL TIME TAKING CARE OF THIS PATIENT: 40 minutes.    Milagros Loll R M.D on 06/12/2016 at 9:01 PM  Between 7am to 6pm - Pager -  [phone]  After 6pm go to [email or web address] - password [email or web address] [facility]  [facility]  Office  [phone]  CC: Primary care Physician: Leanna Sato, MD  Note: This dictation was prepared with Dragon dictation along with smaller phrase technology. Any transcriptional errors that result from this process are unintentional.

## 2016-06-12 NOTE — ED Provider Notes (Signed)
Clinical Course as of Jun 13 724  Tue Jun 12, 2016  2341 Assuming care from Dr. Fanny Bien.  In short, Robin [name] is a 76 y.o. female with a chief complaint of peripheral edema and DVT.  Refer to the original H&P for additional details.  The patient had a hospitalist consult already and they do not feel she needs admission.  The current plan of care is to follow up with case management for medical assistance.   [CF]    Clinical Course User Index [CF] Loleta Rose, MD      Loleta Rose, MD 06/13/16 [phone]

## 2016-06-13 DIAGNOSIS — I82402 Acute embolism and thrombosis of unspecified deep veins of left lower extremity: Secondary | ICD-10-CM | POA: Diagnosis not present

## 2016-06-13 MED ORDER — ACETAMINOPHEN 325 MG PO TABS
ORAL_TABLET | ORAL | Status: AC
  Administered 2016-06-13: 650 mg via ORAL
  Filled 2016-06-13: qty 2

## 2016-06-13 MED ORDER — ACETAMINOPHEN 325 MG PO TABS
650.0000 mg | ORAL_TABLET | Freq: Once | ORAL | Status: AC
  Administered 2016-06-13: 650 mg via ORAL

## 2016-06-13 MED ORDER — RIVAROXABAN 20 MG PO TABS: 20.0000 mg | ORAL_TABLET | Freq: Every day | ORAL | 2 refills | Status: DC

## 2016-06-13 NOTE — Progress Notes (Signed)
Initial HF Clinic appointment scheduled for June 15, 2016 at 9:00am. Thank you for the referral.

## 2016-06-13 NOTE — ED Notes (Signed)
Pt was given breakfast tray 

## 2016-06-13 NOTE — ED Notes (Signed)
Case management has spoken with patient at bedside regarding pts inability to pay for her medications.

## 2016-06-13 NOTE — ED Provider Notes (Signed)
We have provided what help we can in regards to her medication management. She is stable for discharge at this time.   Emily Filbert, MD 06/13/16 [phone]

## 2016-06-13 NOTE — Discharge Instructions (Signed)
Heart Failure Clinic appointment on June 15, 2016 at 9:00am with Clarisa Kindred, FNP. Please call [phone] to reschedule.

## 2016-06-13 NOTE — ED Notes (Signed)
Pt sleeping at this time, pt laying on left side in bed with equal and unlabored chest rise and fall.

## 2016-06-14 ENCOUNTER — Encounter: Payer: Self-pay | Admitting: Emergency Medicine

## 2016-06-14 ENCOUNTER — Emergency Department
Admission: EM | Admit: 2016-06-14 | Discharge: 2016-06-14 | Disposition: A | Discharge status: Home / Self Care | Payer: Medicare Other | Attending: Emergency Medicine | Admitting: Emergency Medicine

## 2016-06-14 DIAGNOSIS — Z951 Presence of aortocoronary bypass graft: Secondary | ICD-10-CM | POA: Insufficient documentation

## 2016-06-14 DIAGNOSIS — E119 Type 2 diabetes mellitus without complications: Secondary | ICD-10-CM | POA: Insufficient documentation

## 2016-06-14 DIAGNOSIS — I2510 Atherosclerotic heart disease of native coronary artery without angina pectoris: Secondary | ICD-10-CM | POA: Diagnosis not present

## 2016-06-14 DIAGNOSIS — I10 Essential (primary) hypertension: Secondary | ICD-10-CM

## 2016-06-14 DIAGNOSIS — I5022 Chronic systolic (congestive) heart failure: Secondary | ICD-10-CM | POA: Insufficient documentation

## 2016-06-14 DIAGNOSIS — I110 Hypertensive heart disease with heart failure: Secondary | ICD-10-CM | POA: Diagnosis not present

## 2016-06-14 DIAGNOSIS — Z7984 Long term (current) use of oral hypoglycemic drugs: Secondary | ICD-10-CM | POA: Insufficient documentation

## 2016-06-14 DIAGNOSIS — Z79899 Other long term (current) drug therapy: Secondary | ICD-10-CM | POA: Insufficient documentation

## 2016-06-14 DIAGNOSIS — J45909 Unspecified asthma, uncomplicated: Secondary | ICD-10-CM | POA: Diagnosis not present

## 2016-06-14 DIAGNOSIS — G8929 Other chronic pain: Secondary | ICD-10-CM | POA: Insufficient documentation

## 2016-06-14 DIAGNOSIS — Z7982 Long term (current) use of aspirin: Secondary | ICD-10-CM | POA: Insufficient documentation

## 2016-06-14 DIAGNOSIS — Z9114 Patient's other noncompliance with medication regimen: Secondary | ICD-10-CM

## 2016-06-14 DIAGNOSIS — R110 Nausea: Secondary | ICD-10-CM | POA: Diagnosis not present

## 2016-06-14 DIAGNOSIS — R109 Unspecified abdominal pain: Secondary | ICD-10-CM

## 2016-06-14 LAB — COMPREHENSIVE METABOLIC PANEL
ALT: 43 U/L (ref 14–54)
AST: 22 U/L (ref 15–41)
Albumin: 3 g/dL — ABNORMAL LOW (ref 3.5–5.0)
Alkaline Phosphatase: 152 U/L — ABNORMAL HIGH (ref 38–126)
Anion gap: 6 (ref 5–15)
BUN: 29 mg/dL — ABNORMAL HIGH (ref 6–20)
CHLORIDE: 103 mmol/L (ref 101–111)
CO2: 29 mmol/L (ref 22–32)
CREATININE: 1.24 mg/dL — AB (ref 0.44–1.00)
Calcium: 8.4 mg/dL — ABNORMAL LOW (ref 8.9–10.3)
GFR calc non Af Amer: 41 mL/min — ABNORMAL LOW (ref >60)
GFR, EST AFRICAN AMERICAN: 48 mL/min — AB (ref >60)
Glucose, Bld: 229 mg/dL — ABNORMAL HIGH (ref 65–99)
Potassium: 4.3 mmol/L (ref 3.5–5.1)
SODIUM: 138 mmol/L (ref 135–145)
Total Bilirubin: 0.8 mg/dL (ref 0.3–1.2)
Total Protein: 6.6 g/dL (ref 6.5–8.1)

## 2016-06-14 LAB — CBC
HCT: 37.2 % (ref 35.0–47.0)
HEMOGLOBIN: 11.7 g/dL — AB (ref 12.0–16.0)
MCH: 24.7 pg — AB (ref 26.0–34.0)
MCHC: 31.5 g/dL — ABNORMAL LOW (ref 32.0–36.0)
MCV: 78.4 fL — AB (ref 80.0–100.0)
Platelets: 226 10*3/uL (ref 150–440)
RBC: 4.75 MIL/uL (ref 3.80–5.20)
RDW: 16.5 % — ABNORMAL HIGH (ref 11.5–14.5)
WBC: 11 10*3/uL (ref 3.6–11.0)

## 2016-06-14 LAB — GLUCOSE, CAPILLARY: Glucose-Capillary: 214 mg/dL — ABNORMAL HIGH (ref 65–99)

## 2016-06-14 LAB — LIPASE, BLOOD: LIPASE: 11 U/L (ref 11–51)

## 2016-06-14 MED ORDER — METOPROLOL TARTRATE 50 MG PO TABS
100.0000 mg | ORAL_TABLET | Freq: Once | ORAL | Status: AC
  Administered 2016-06-14: 100 mg via ORAL
  Filled 2016-06-14: qty 2

## 2016-06-14 MED ORDER — ONDANSETRON HCL 4 MG/2ML IJ SOLN
4.0000 mg | INTRAMUSCULAR | Status: AC
  Administered 2016-06-14: 4 mg via INTRAVENOUS
  Filled 2016-06-14: qty 2

## 2016-06-14 MED ORDER — ONDANSETRON 4 MG PO TBDP: ORAL_TABLET | ORAL | 0 refills | Status: DC

## 2016-06-14 NOTE — Discharge Instructions (Signed)
As we discussed, your workup today was reassuring.  You have no changes from your evaluation less than 24 hours ago.  We prescribed a medication to help with your chronic nausea (ondansetron/Zofran).   It is very important that you take your prescribed medications and follow up with your primary care doctor.  Please see if your family can help you with your medication regimen so you do not forget to take your medications.

## 2016-06-14 NOTE — ED Triage Notes (Addendum)
Pt arrived to ED from home by EMS with c/o of abdominal pain. EMS reports pt was seen and discharged from ED on Tuesday for what pt sts was "blood clot in stomach." Pt c/o N/V and lower abdominal pain. Pt A&O x4 upon arrival.  (BP on arrival 203/73) EMS also sts pt wanted to go to [facility] and refused to bring in meds from home.

## 2016-06-14 NOTE — ED Provider Notes (Signed)
[facility] Emergency Department Provider Note  ____________________________________________   First MD Initiated Contact with Patient 06/14/16 [phone]     (approximate)  I have reviewed the triage vital signs and the nursing notes.   The history is limited by the patient being a vague historian.   HISTORY  Chief Complaint Abdominal Pain and Nausea    HPI Robin [name] is a 76 y.o. female who is well-known to this emergency department with 17 visits in the last 6 months who presents by EMS for evaluation of nausea and abdominal pain.  These are chronic symptoms for her.  She was discharged from this emergency department about 16 hours ago after staying overnight for case management assistance in getting her medications filled.  She admits to being noncompliant with her medication regimen and it is well documented in [facility] as well as in documentation from [facility], where she also goes frequently for emergency department visits and to which she has been previously admitted.  She has an extensive past medical history that includes DVT on Xarelto and hypertension, but she frequently does not take her medications.  See ED course for additional details.  Currently she complains that she did not feel well when she was sent home yesterday and that she needs more medications.  She is vague and nonspecific with her symptoms but she most clearly describes her discomfort as nausea and does not say that she is having pain.  She denies chest pain and shortness of breath.  She denies fever/chills.  She has chronic bilateral leg pain but she is not complaining of that today.     Past Medical History:  Diagnosis Date  . Afib (HCC)   . Anxiety   . Asthma   . CHF (congestive heart failure) (HCC)   . Coronary artery disease   . Diabetes mellitus without complication (HCC)   . Hyperlipidemia   . Hypertension     Patient Active Problem List   Diagnosis Date Noted  . Abdominal pain  06/07/2016  . Transaminitis   . Atrial fibrillation with RVR (HCC) 05/03/2016  . Demand ischemia (HCC) 05/03/2016  . Moderate aortic stenosis 05/03/2016  . Moderate mitral regurgitation 05/03/2016  . History of noncompliance with medical treatment 05/03/2016  . Congestive dilated cardiomyopathy (HCC) 05/03/2016  . Chest pain 05/02/2016  . 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 Renal artery stenosis (HCC) 02/28/2016  . Accelerated hypertension 02/28/2016  . Mild dementia 02/28/2016  . Noncompliance 02/07/2016  . Renal infarct (HCC) 02/06/2016  . Atrial fibrillation with rapid ventricular response (HCC) 02/06/2016  . A-fib (HCC) 01/24/2016  . HTN (hypertension) 11/04/2015  . HLD (hyperlipidemia) 11/04/2015  . CAD (coronary artery disease) 11/04/2015  . Chronic systolic CHF (congestive heart failure) (HCC) 11/04/2015  . Diabetes (HCC) 11/04/2015    Past Surgical History:  Procedure Laterality Date  . CORONARY ARTERY BYPASS GRAFT    . NO PAST SURGERIES    . PERIPHERAL VASCULAR CATHETERIZATION Left 03/02/2016   Procedure: Renal Intervention;  Surgeon: Renford Dills, MD;  Location: [facility] INVASIVE CV LAB;  Service: Cardiovascular;  Laterality: Left;    Prior to Admission medications   Medication Sig Start Date End Date Taking? Authorizing Provider  albuterol (ACCUNEB) 1.25 MG/3ML nebulizer solution Take 1.25 mg by nebulization every 4 (four) hours as needed for wheezing or shortness of breath.    Yes Historical Provider, MD  aspirin 81 MG chewable tablet Chew 81 mg by mouth daily. 04/13/16  Yes Historical  Provider, MD  atorvastatin (LIPITOR) 80 MG tablet Take 1 tablet (80 mg total) by mouth daily at 6 PM. 03/05/16  Yes Enedina Finner, MD  diltiazem (CARDIZEM CD) 180 MG 24 hr capsule Take 1 capsule (180 mg total) by mouth daily. 03/05/16  Yes Enedina Finner, MD  furosemide (LASIX) 20 MG tablet Take 20 mg by mouth every morning.   Yes Historical Provider, MD  glipiZIDE (GLUCOTROL) 5 MG  tablet Take 0.5 tablets (2.5 mg total) by mouth daily before breakfast. 03/06/16  Yes Enedina Finner, MD  LORazepam (ATIVAN) 1 MG tablet Take 1 tablet (1 mg total) by mouth 2 (two) times daily. 05/05/16 05/05/17 Yes Emily Filbert, MD  meclizine (ANTIVERT) 25 MG tablet Take 1 tablet (25 mg total) by mouth 3 (three) times daily as needed for dizziness. 11/06/15  Yes Enid Baas, MD  metoprolol (LOPRESSOR) 100 MG tablet Take 1 tablet (100 mg total) by mouth 3 (three) times daily. 06/09/16  Yes Ramonita Lab, MD  mometasone-formoterol (DULERA) 200-5 MCG/ACT AERO Inhale 2 puffs into the lungs 2 (two) times daily. 03/05/16  Yes Enedina Finner, MD  nitroGLYCERIN (NITROSTAT) 0.4 MG SL tablet Place 1 tablet (0.4 mg total) under the tongue every 5 (five) minutes as needed for chest pain. 01/25/16  Yes Adrian Saran, MD  oxyCODONE-acetaminophen (PERCOCET/ROXICET) 5-325 MG tablet Take 1 tablet by mouth every 4 (four) hours as needed for moderate pain or severe pain. 03/05/16  Yes Enedina Finner, MD  pantoprazole (PROTONIX) 40 MG tablet Take 1 tablet (40 mg total) by mouth daily. Patient taking differently: Take 40 mg by mouth daily as needed. For acid reflux 11/06/15  Yes Enid Baas, MD  rivaroxaban (XARELTO) 20 MG TABS tablet Take 1 tablet (20 mg total) by mouth daily with supper. 06/13/16  Yes Emily Filbert, MD  ondansetron (ZOFRAN ODT) 4 MG disintegrating tablet Allow 1-2 tablets to dissolve in your mouth every 8 hours as needed for nausea/vomiting 06/14/16   Loleta Rose, MD    Allergies Morphine and related  Family History  Problem Relation Age of Onset  . Hypertension Mother   . Heart failure Mother   . Heart disease Father     Social History Social History  Substance Use Topics  . Smoking status: Never Smoker  . Smokeless tobacco: Never Used  . Alcohol use No    Review of Systems Constitutional: No fever/chills Eyes: No visual changes. ENT: No sore throat. Cardiovascular: Denies chest  pain. Respiratory: Denies shortness of breath. Gastrointestinal: Unclear abdominal pain.  Nausea, no vomiting.  No diarrhea.  No constipation. Genitourinary: Negative for dysuria. Musculoskeletal: Negative for back pain.  Chronic pitting edema. Skin: Negative for rash. Neurological: Negative for headaches, focal weakness or numbness.  10-point ROS otherwise negative.  ____________________________________________   PHYSICAL EXAM:  ED Triage Vitals  Enc Vitals Group     BP 06/14/16 0408 (!) 200/71     Pulse Rate 06/14/16 0408 69     Resp 06/14/16 0408 16     Temp 06/14/16 0408 97.6 F (36.4 C)     Temp Source 06/14/16 0408 Oral     SpO2 06/14/16 0408 99 %     Weight 06/14/16 0325 140 lb (63.5 kg)     Height 06/14/16 0325 5\' 4"  (1.626 m)     Head Circumference --      Peak Flow --      Pain Score --      Pain Loc --  Pain Edu? --      Excl. in GC? --       Constitutional: Alert and oriented. Chronically debilitated but in no acute distress Eyes: Conjunctivae are normal. PERRL. EOMI. Head: Atraumatic. Nose: No congestion/rhinnorhea. Mouth/Throat: Mucous membranes are moist. Neck: No stridor.  No meningeal signs.   Cardiovascular: Normal rate, regular rhythm. Good peripheral circulation. Grossly normal heart sounds. Respiratory: Normal respiratory effort.  No retractions. Lungs CTAB. Gastrointestinal: Soft and nontender. No distention.  Musculoskeletal: No lower extremity tenderness.  Chronic bilateral pitting edema to the level of the knees., slightly greater left than right. Neurologic:  Normal speech and language. No gross focal neurologic deficits are appreciated.  Skin:  Skin is warm, dry and intact. No rash noted. Psychiatric: Mood and affect are normal. Speech and behavior are normal.  ____________________________________________   LABS (all labs ordered are listed, but only abnormal results are displayed)  Labs Reviewed  COMPREHENSIVE METABOLIC PANEL -  Abnormal; Notable for the following:       Result Value   Glucose, Bld 229 (*)    BUN 29 (*)    Creatinine, Ser 1.24 (*)    Calcium 8.4 (*)    Albumin 3.0 (*)    Alkaline Phosphatase 152 (*)    GFR calc non Af Amer 41 (*)    GFR calc Af Amer 48 (*)    All other components within normal limits  CBC - Abnormal; Notable for the following:    Hemoglobin 11.7 (*)    MCV 78.4 (*)    MCH 24.7 (*)    MCHC 31.5 (*)    RDW 16.5 (*)    All other components within normal limits  GLUCOSE, CAPILLARY - Abnormal; Notable for the following:    Glucose-Capillary 214 (*)    All other components within normal limits  LIPASE, BLOOD   ____________________________________________  EKG  ED ECG REPORT I, Neelah Mannings, the attending physician, personally viewed and interpreted this ECG.  Date: 06/14/2016 EKG Time: 3:27 AM Rate: 71 Rhythm: normal sinus rhythm with short PR interval QRS Axis: normal Intervals: PR interval is 59 ms.  Nonspecific intraventricular conduction delay. ST/T Wave abnormalities: Non-specific ST segment / T-wave changes, but no evidence of acute ischemia. Conduction Disturbances: none Narrative Interpretation: unremarkable, no significant change from prior  ____________________________________________  RADIOLOGY   No results found.  ____________________________________________   PROCEDURES  Critical Care performed: No   Procedure(s) performed:   Procedures   ____________________________________________   INITIAL IMPRESSION / ASSESSMENT AND PLAN / ED COURSE  Pertinent labs & imaging results that were available during my care of the patient were reviewed by me and considered in my medical decision making (see chart for details).  The patient has no abdominal tenderness to palpation.  She is complaining of nausea.  I looked back through medical records and [facility] and looked in CareEverywhere at the emergency Department provider notes from her 4 visits in the  last month at [facility].  Each time she reports some sort of abdominal discomfort, frequently abdominal pain and nausea.  There are extensive notes from [facility] care management about her noncompliance, attempts to get her prescriptions filled at the [facility] pharmacy, etc.  Case management evaluated her less than 24 hours ago and provided whatever assistance they could regarding her medications, but she is somewhat limited due to the type of Medicare that she has.  In the absence of any acute medical illness at this time, she does not qualify for inpatient admission.  I have ordered Zofran 4 mg IV for her nausea and anticipate discharge with outpatient follow-up.  She is hypertensive currently in the setting of medication noncompliance.  I will give her a dose of Lopressor 100 mg by mouth.  Clinical Course as of Jun 15 422  Thu Jun 14, 2016  0418 Labs are consistent with prior.  Symptoms are chronic.  No dysuria, no indication to check UA.  Provided dose of Lopressor for HTN, but patient having no symptoms consistent with hypertensive urgency/emergency at this time.  Patient not having any chest pain nor SOB.  Has a chronically elevated troponin, but no indication to recheck tonight.  Encouraged her to follow up with her regular doctor and take her medications as prescribed.  [CF]    Clinical Course User Index [CF] Loleta Rose, MD    ____________________________________________  FINAL CLINICAL IMPRESSION(S) / ED DIAGNOSES  Final diagnoses:  Chronic abdominal pain  Chronic nausea  Essential hypertension  Non compliance w medication regimen     MEDICATIONS GIVEN DURING THIS VISIT:  Medications  ondansetron (ZOFRAN) injection 4 mg (4 mg Intravenous Given 06/14/16 0414)  metoprolol (LOPRESSOR) tablet 100 mg (100 mg Oral Given 06/14/16 0414)     NEW OUTPATIENT MEDICATIONS STARTED DURING THIS VISIT:  New Prescriptions   ONDANSETRON (ZOFRAN ODT) 4 MG DISINTEGRATING TABLET    Allow 1-2 tablets to  dissolve in your mouth every 8 hours as needed for nausea/vomiting    Modified Medications   No medications on file    Discontinued Medications   ONDANSETRON (ZOFRAN ODT) 4 MG DISINTEGRATING TABLET    Take 1 tablet (4 mg total) by mouth every 8 (eight) hours as needed for nausea or vomiting.     Note:  This document was prepared using Dragon voice recognition software and may include unintentional dictation errors.    Loleta Rose, MD 06/14/16 [phone]

## 2016-06-15 ENCOUNTER — Ambulatory Visit: Payer: Medicare Other | Admitting: Family

## 2016-06-15 ENCOUNTER — Telehealth: Payer: Self-pay | Admitting: Family

## 2016-06-15 NOTE — Telephone Encounter (Signed)
Patient missed [facility] initial appointment [facility] on 06/15/16. Will attempt to reschedule.

## 2016-06-23 ENCOUNTER — Emergency Department
Admission: EM | Admit: 2016-06-23 | Discharge: 2016-06-23 | Disposition: A | Discharge status: Home / Self Care | Payer: Medicare Other | Attending: Emergency Medicine | Admitting: Emergency Medicine

## 2016-06-23 ENCOUNTER — Encounter: Payer: Self-pay | Admitting: Emergency Medicine

## 2016-06-23 DIAGNOSIS — E119 Type 2 diabetes mellitus without complications: Secondary | ICD-10-CM | POA: Diagnosis not present

## 2016-06-23 DIAGNOSIS — Z7982 Long term (current) use of aspirin: Secondary | ICD-10-CM | POA: Diagnosis not present

## 2016-06-23 DIAGNOSIS — Z7984 Long term (current) use of oral hypoglycemic drugs: Secondary | ICD-10-CM | POA: Insufficient documentation

## 2016-06-23 DIAGNOSIS — R609 Edema, unspecified: Secondary | ICD-10-CM | POA: Insufficient documentation

## 2016-06-23 DIAGNOSIS — J45909 Unspecified asthma, uncomplicated: Secondary | ICD-10-CM | POA: Diagnosis not present

## 2016-06-23 DIAGNOSIS — R1084 Generalized abdominal pain: Secondary | ICD-10-CM | POA: Insufficient documentation

## 2016-06-23 DIAGNOSIS — I509 Heart failure, unspecified: Secondary | ICD-10-CM | POA: Insufficient documentation

## 2016-06-23 DIAGNOSIS — G8929 Other chronic pain: Secondary | ICD-10-CM | POA: Insufficient documentation

## 2016-06-23 DIAGNOSIS — R109 Unspecified abdominal pain: Secondary | ICD-10-CM

## 2016-06-23 DIAGNOSIS — I10 Essential (primary) hypertension: Secondary | ICD-10-CM | POA: Diagnosis not present

## 2016-06-23 DIAGNOSIS — I2510 Atherosclerotic heart disease of native coronary artery without angina pectoris: Secondary | ICD-10-CM | POA: Insufficient documentation

## 2016-06-23 DIAGNOSIS — I110 Hypertensive heart disease with heart failure: Secondary | ICD-10-CM | POA: Diagnosis not present

## 2016-06-23 DIAGNOSIS — R51 Headache: Secondary | ICD-10-CM | POA: Insufficient documentation

## 2016-06-23 LAB — CBC
HCT: 38.4 % (ref 35.0–47.0)
HEMOGLOBIN: 11.9 g/dL — AB (ref 12.0–16.0)
MCH: 24.2 pg — AB (ref 26.0–34.0)
MCHC: 30.9 g/dL — ABNORMAL LOW (ref 32.0–36.0)
MCV: 78.5 fL — ABNORMAL LOW (ref 80.0–100.0)
Platelets: 342 10*3/uL (ref 150–440)
RBC: 4.9 MIL/uL (ref 3.80–5.20)
RDW: 16.8 % — ABNORMAL HIGH (ref 11.5–14.5)
WBC: 10.4 10*3/uL (ref 3.6–11.0)

## 2016-06-23 LAB — COMPREHENSIVE METABOLIC PANEL
ALK PHOS: 147 U/L — AB (ref 38–126)
ALT: 41 U/L (ref 14–54)
ANION GAP: 8 (ref 5–15)
AST: 29 U/L (ref 15–41)
Albumin: 3.1 g/dL — ABNORMAL LOW (ref 3.5–5.0)
BUN: 27 mg/dL — ABNORMAL HIGH (ref 6–20)
CALCIUM: 8.6 mg/dL — AB (ref 8.9–10.3)
CO2: 27 mmol/L (ref 22–32)
Chloride: 104 mmol/L (ref 101–111)
Creatinine, Ser: 1.16 mg/dL — ABNORMAL HIGH (ref 0.44–1.00)
GFR, EST AFRICAN AMERICAN: 52 mL/min — AB (ref >60)
GFR, EST NON AFRICAN AMERICAN: 45 mL/min — AB (ref >60)
Glucose, Bld: 252 mg/dL — ABNORMAL HIGH (ref 65–99)
Potassium: 3.6 mmol/L (ref 3.5–5.1)
SODIUM: 139 mmol/L (ref 135–145)
TOTAL PROTEIN: 6.7 g/dL (ref 6.5–8.1)
Total Bilirubin: 0.7 mg/dL (ref 0.3–1.2)

## 2016-06-23 LAB — LIPASE, BLOOD: Lipase: 19 U/L (ref 11–51)

## 2016-06-23 MED ORDER — ONDANSETRON 4 MG PO TBDP
4.0000 mg | ORAL_TABLET | Freq: Once | ORAL | Status: AC | PRN
  Administered 2016-06-23: 4 mg via ORAL
  Filled 2016-06-23: qty 1

## 2016-06-23 NOTE — ED Triage Notes (Signed)
Patient with complaint of generalized abdominal pain, vomiting and headache that started 2 days ago. Patient denies diarrhea, fever or urinary symptoms.

## 2016-06-23 NOTE — ED Notes (Signed)
Pt states abd time more than 3 weeks. States she has seen a doctor but does not know the dx but that they told her if the pain didn't get better they were going to operate on her. Pt sitting comfortably in the bed and placed on monitor/bp cuff

## 2016-06-23 NOTE — Discharge Instructions (Signed)
You have been seen in the Emergency Department (ED) for abdominal pain.  The nature of your symptoms is chronic and you need to follow up as an outpatient.  Your evaluation today was reassuring with no new or acute medical issues.  Please follow up as instructed above regarding today?s emergent visit and the symptoms that are bothering you.  As we discussed, we cannot provide narcotics for chronic pain according to the [facility] Chronic Pain policy.  Return to the ED if your abdominal pain worsens or fails to improve, you develop bloody vomiting, bloody diarrhea, you are unable to tolerate fluids due to vomiting, fever greater than 101, or other symptoms that concern you.

## 2016-06-23 NOTE — ED Provider Notes (Signed)
[facility] Emergency Department Provider Note  ____________________________________________   First MD Initiated Contact with Patient 06/23/16 2309     (approximate)  I have reviewed the triage vital signs and the nursing notes.   HISTORY  Chief Complaint Abdominal Pain and Headache    HPI Robin [name] is a 76 y.o. female well-known to this emergency department for 18 visits in the last 6 months for these same symptoms as well as numerous visits to [facility] in the intervening times.  She presents tonight for chronic abdominal pain, chronic hypertension, and chronic peripheral edema.  She is frequently noncompliant with her medications but insists that this time she has been taking her medicines and that her fianc has been helping her.  She was last seen by me about one week ago and then 3 days ago went to the [facility] emergency Department, was evaluated, and discharged for outpatient follow-up.  She states that she has not been able to see her primary care doctor at any point after any of her recent visits.    She states that the swelling in her legs is actually better than it has been because she has been taking her fluid pill.  She denies fever/chills, chest pain, shortness of breath.  She states that the abdominal pain is the same as it has been; generalized aching throughout her abdomen that is mild to moderate and nothing makes it better or worse.  She says that over-the-counter medication does not help.  She denies nausea, vomiting, diarrhea, dysuria.  Past Medical History:  Diagnosis Date  . Afib (HCC)   . Anxiety   . Asthma   . CHF (congestive heart failure) (HCC)   . Coronary artery disease   . Diabetes mellitus without complication (HCC)   . Hyperlipidemia   . Hypertension     Patient Active Problem List   Diagnosis Date Noted  . Abdominal pain 06/07/2016  . Transaminitis   . Atrial fibrillation with RVR (HCC) 05/03/2016  . Demand ischemia (HCC)  05/03/2016  . Moderate aortic stenosis 05/03/2016  . Moderate mitral regurgitation 05/03/2016  . History of noncompliance with medical treatment 05/03/2016  . Congestive dilated cardiomyopathy (HCC) 05/03/2016  . Chest pain 05/02/2016  . 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 Renal artery stenosis (HCC) 02/28/2016  . Accelerated hypertension 02/28/2016  . Mild dementia 02/28/2016  . Noncompliance 02/07/2016  . Renal infarct (HCC) 02/06/2016  . Atrial fibrillation with rapid ventricular response (HCC) 02/06/2016  . A-fib (HCC) 01/24/2016  . HTN (hypertension) 11/04/2015  . HLD (hyperlipidemia) 11/04/2015  . CAD (coronary artery disease) 11/04/2015  . Chronic systolic CHF (congestive heart failure) (HCC) 11/04/2015  . Diabetes (HCC) 11/04/2015    Past Surgical History:  Procedure Laterality Date  . CORONARY ARTERY BYPASS GRAFT    . NO PAST SURGERIES    . PERIPHERAL VASCULAR CATHETERIZATION Left 03/02/2016   Procedure: Renal Intervention;  Surgeon: Renford Dills, MD;  Location: [facility] INVASIVE CV LAB;  Service: Cardiovascular;  Laterality: Left;    Prior to Admission medications   Medication Sig Start Date End Date Taking? Authorizing Provider  albuterol (ACCUNEB) 1.25 MG/3ML nebulizer solution Take 1.25 mg by nebulization every 4 (four) hours as needed for wheezing or shortness of breath.     Historical Provider, MD  aspirin 81 MG chewable tablet Chew 81 mg by mouth daily. 04/13/16   Historical Provider, MD  atorvastatin (LIPITOR) 80 MG tablet Take 1 tablet (80 mg total) by mouth daily  at 6 PM. 03/05/16   Enedina Finner, MD  diltiazem (CARDIZEM CD) 180 MG 24 hr capsule Take 1 capsule (180 mg total) by mouth daily. 03/05/16   Enedina Finner, MD  furosemide (LASIX) 20 MG tablet Take 20 mg by mouth every morning.    Historical Provider, MD  glipiZIDE (GLUCOTROL) 5 MG tablet Take 0.5 tablets (2.5 mg total) by mouth daily before breakfast. 03/06/16   Enedina Finner, MD  LORazepam (ATIVAN) 1 MG  tablet Take 1 tablet (1 mg total) by mouth 2 (two) times daily. 05/05/16 05/05/17  Emily Filbert, MD  meclizine (ANTIVERT) 25 MG tablet Take 1 tablet (25 mg total) by mouth 3 (three) times daily as needed for dizziness. 11/06/15   Enid Baas, MD  metoprolol (LOPRESSOR) 100 MG tablet Take 1 tablet (100 mg total) by mouth 3 (three) times daily. 06/09/16   Ramonita Lab, MD  mometasone-formoterol (DULERA) 200-5 MCG/ACT AERO Inhale 2 puffs into the lungs 2 (two) times daily. 03/05/16   Enedina Finner, MD  nitroGLYCERIN (NITROSTAT) 0.4 MG SL tablet Place 1 tablet (0.4 mg total) under the tongue every 5 (five) minutes as needed for chest pain. 01/25/16   Adrian Saran, MD  ondansetron (ZOFRAN ODT) 4 MG disintegrating tablet Allow 1-2 tablets to dissolve in your mouth every 8 hours as needed for nausea/vomiting 06/14/16   Loleta Rose, MD  oxyCODONE-acetaminophen (PERCOCET/ROXICET) 5-325 MG tablet Take 1 tablet by mouth every 4 (four) hours as needed for moderate pain or severe pain. 03/05/16   Enedina Finner, MD  pantoprazole (PROTONIX) 40 MG tablet Take 1 tablet (40 mg total) by mouth daily. Patient taking differently: Take 40 mg by mouth daily as needed. For acid reflux 11/06/15   Enid Baas, MD  rivaroxaban (XARELTO) 20 MG TABS tablet Take 1 tablet (20 mg total) by mouth daily with supper. 06/13/16   Emily Filbert, MD    Allergies Morphine and related  Family History  Problem Relation Age of Onset  . Hypertension Mother   . Heart failure Mother   . Heart disease Father     Social History Social History  Substance Use Topics  . Smoking status: Never Smoker  . Smokeless tobacco: Never Used  . Alcohol use No    Review of Systems Constitutional: No fever/chills Eyes: No visual changes. ENT: No sore throat. Cardiovascular: Denies chest pain. Respiratory: Denies shortness of breath. Gastrointestinal: Chronic generalized abdominal pain.  No nausea, no vomiting.  No diarrhea.  No  constipation. Genitourinary: Negative for dysuria. Musculoskeletal: Negative for back pain. Chronic peripheral edema, better than prior Skin: Negative for rash. Neurological: Negative for headaches, focal weakness or numbness.  10-point ROS otherwise negative.  ____________________________________________   PHYSICAL EXAM:  VITAL SIGNS: ED Triage Vitals  Enc Vitals Group     BP 06/23/16 2011 (!) 204/54     Pulse Rate 06/23/16 2008 63     Resp 06/23/16 2008 18     Temp 06/23/16 2008 97.6 F (36.4 C)     Temp Source 06/23/16 2008 Oral     SpO2 06/23/16 2008 97 %     Weight 06/23/16 2008 140 lb (63.5 kg)     Height 06/23/16 2008 5\' 4"  (1.626 m)     Head Circumference --      Peak Flow --      Pain Score 06/23/16 2007 10     Pain Loc --      Pain Edu? --      Excl. in  GC? --     Constitutional: Alert and oriented. Chronically debilitated but in no acute distress and actually more well-appearing than when I saw her a week ago Eyes: Conjunctivae are normal. PERRL. EOMI. Head: Atraumatic. Nose: No congestion/rhinnorhea. Mouth/Throat: Mucous membranes are moist. Neck: No stridor.  No meningeal signs.   Cardiovascular: Normal rate, regular rhythm. Good peripheral circulation. Grossly normal heart sounds. Respiratory: Normal respiratory effort.  No retractions. Lungs CTAB. Gastrointestinal: Soft and nontender. No distention.  Musculoskeletal: Trace pitting edema in bilateral lower extremities with no evidence of cellulitis. No gross deformities of extremities. Neurologic:  Normal speech and language. No gross focal neurologic deficits are appreciated.  Skin:  Skin is warm, dry and intact. No rash noted. Psychiatric: Mood and affect are normal. Speech and behavior are normal.  ____________________________________________   LABS (all labs ordered are listed, but only abnormal results are displayed)  Labs Reviewed  COMPREHENSIVE METABOLIC PANEL - Abnormal; Notable for the  following:       Result Value   Glucose, Bld 252 (*)    BUN 27 (*)    Creatinine, Ser 1.16 (*)    Calcium 8.6 (*)    Albumin 3.1 (*)    Alkaline Phosphatase 147 (*)    GFR calc non Af Amer 45 (*)    GFR calc Af Amer 52 (*)    All other components within normal limits  CBC - Abnormal; Notable for the following:    Hemoglobin 11.9 (*)    MCV 78.5 (*)    MCH 24.2 (*)    MCHC 30.9 (*)    RDW 16.8 (*)    All other components within normal limits  LIPASE, BLOOD  URINALYSIS, COMPLETE (UACMP) WITH MICROSCOPIC   ____________________________________________  EKG  None - EKG not ordered by ED physician ____________________________________________  RADIOLOGY   No results found.  ____________________________________________   PROCEDURES  Critical Care performed: No   Procedure(s) performed:   Procedures   ____________________________________________   INITIAL IMPRESSION / ASSESSMENT AND PLAN / ED COURSE  Pertinent labs & imaging results that were available during my care of the patient were reviewed by me and considered in my medical decision making (see chart for details).  The patient looks better tonight than she has in the past.  She is wearing a cervical and has put on makeup and is wearing jewelry which is not usual for her.  She was resting fairly when I enter the room.  Her fianc is with her this time and the 3 of [place] had a lengthy conversation about the chronic nature of her conditions and symptoms, including the chronic pain policy at [facility] and why I will not prescribe her any medication.  It is important to note that her labs are all consistent with prior, her vital signs are all consistent with prior, she continues to have absolutely no abdominal tenderness to palpation in spite of her chief complaint.  She and her fianc seemed reassured after our conversation and they understand that they need to follow up with her primary care provider, but state it is  hard to do so and they would like a number for a new doctor.  I will Provide the number for the patient navigator with [facility] to assist her, but I encouraged her to try and follow up with the primary care provider that knows her well.  I encouraged her to continue on her medication regimen and encouraged her to keep taking her fluid pill because  her edema has improved from prior.  I gave my usual and customary return precautions.         ____________________________________________  FINAL CLINICAL IMPRESSION(S) / ED DIAGNOSES  Final diagnoses:  Chronic abdominal pain  Peripheral edema  Essential hypertension     MEDICATIONS GIVEN DURING THIS VISIT:  Medications  ondansetron (ZOFRAN-ODT) disintegrating tablet 4 mg (4 mg Oral Given 06/23/16 2024)     NEW OUTPATIENT MEDICATIONS STARTED DURING THIS VISIT:  New Prescriptions   No medications on file    Modified Medications   No medications on file    Discontinued Medications   No medications on file     Note:  This document was prepared using Dragon voice recognition software and may include unintentional dictation errors.    Loleta Rose, MD 06/23/16 2328

## 2016-07-19 IMAGING — CR DG CHEST 2V
1 series · 2 of 2 positions shown · non-contrast
Comparison: 02/10/2014.

CLINICAL DATA: Initial encounter for shortness of breath.

EXAM:
CHEST  2 VIEW

[Series 1: dxr chest pa (or ap) and lateral · 0.14mm/px · 2 of 2 slices shown]
[im 1/2]
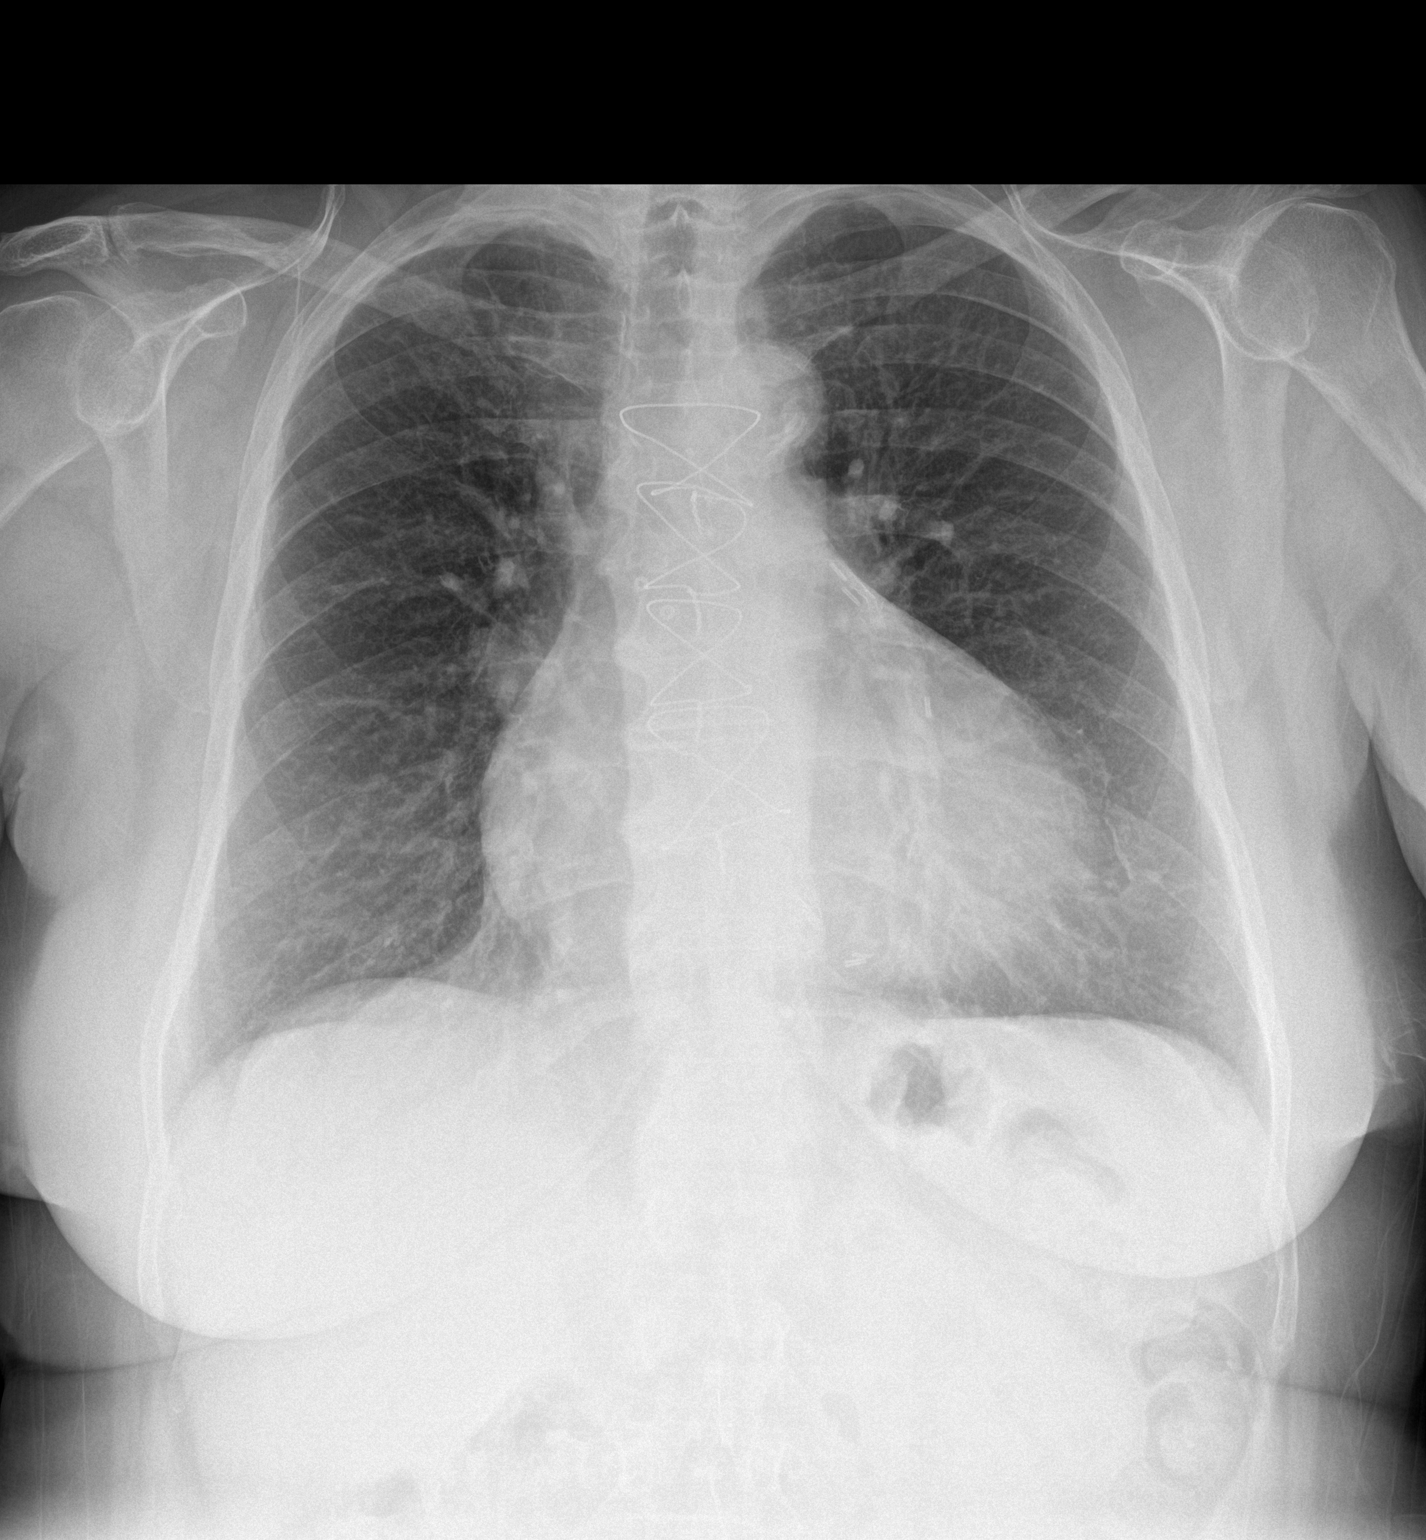
[im 2/2]
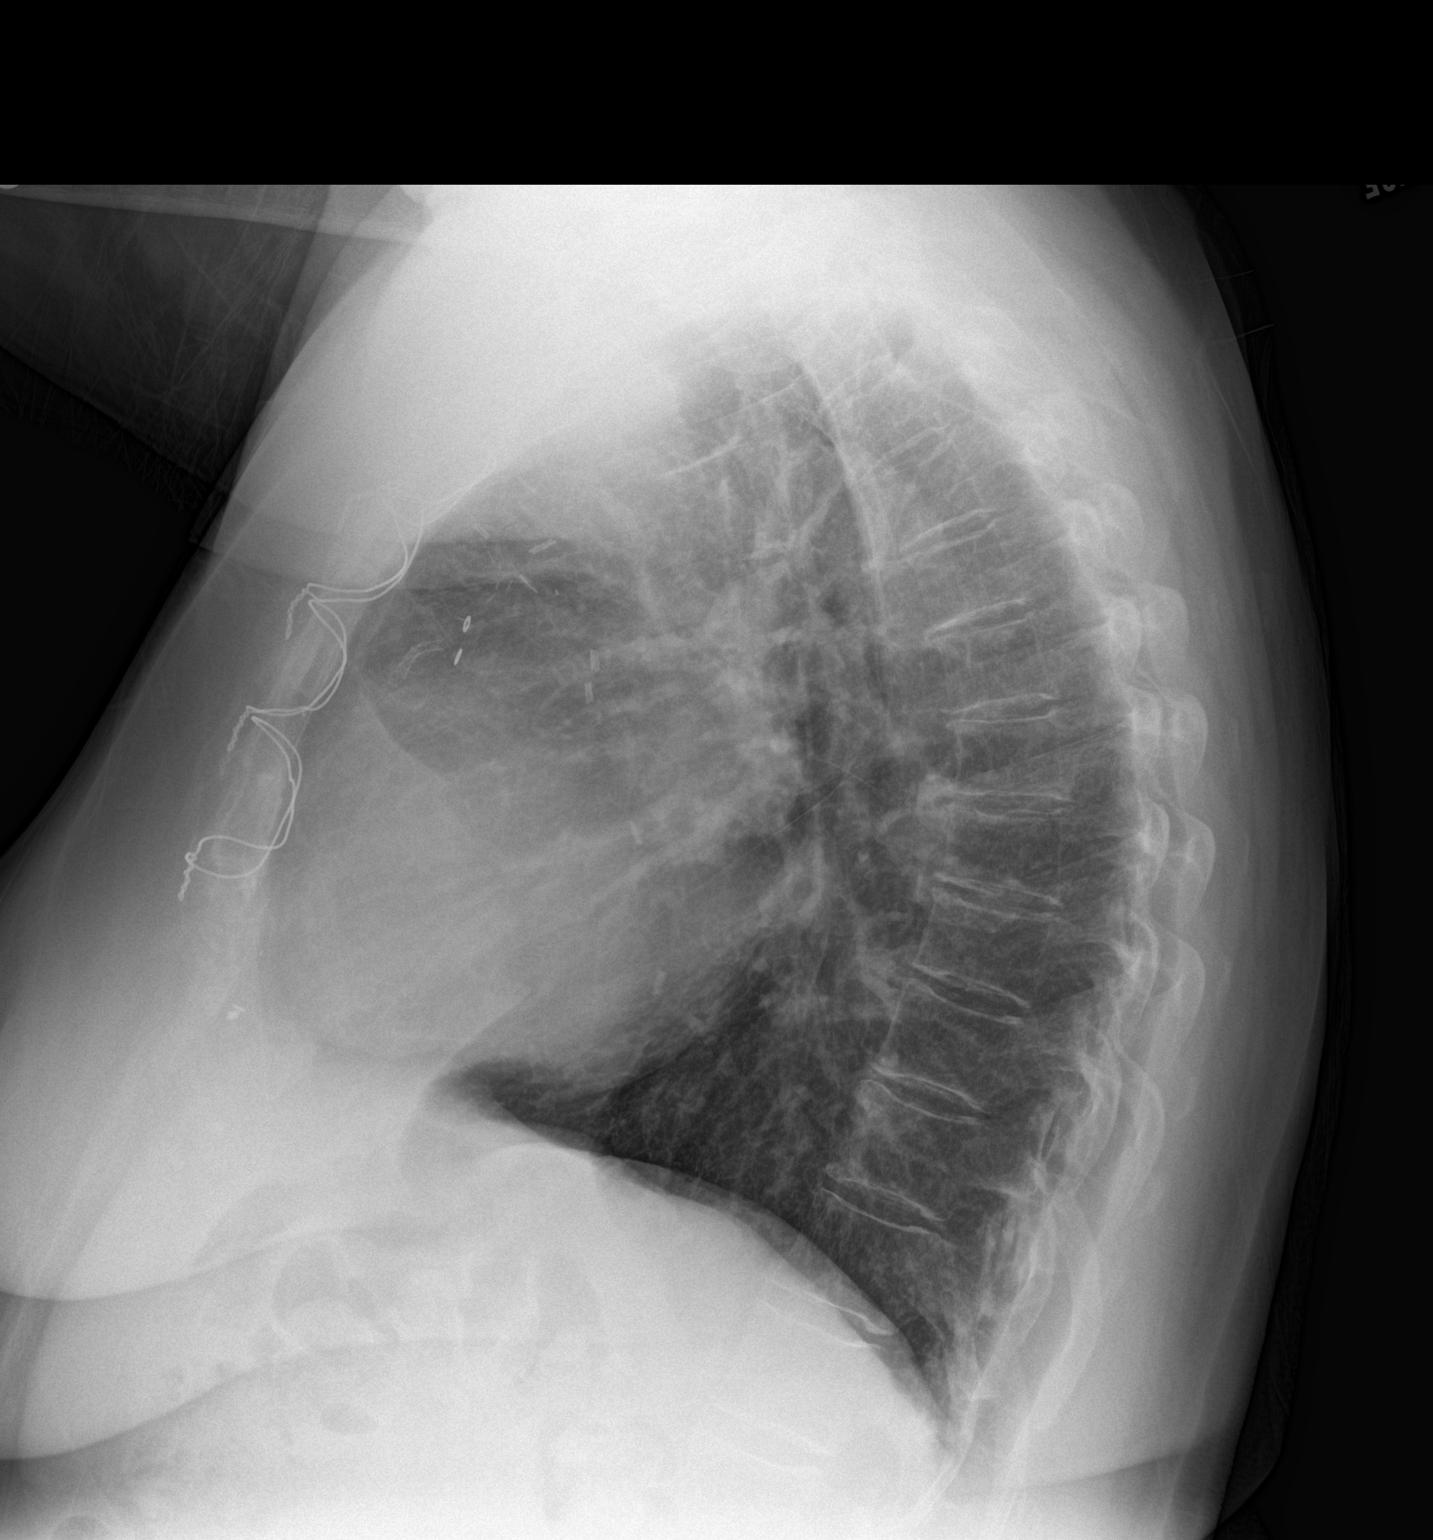

[2 of 2 positions shown; findings below may reference images not displayed]

FINDINGS: The lungs are clear without focal infiltrate, edema, pneumothorax or
pleural effusion. The cardio pericardial silhouette is enlarged.
Patient is status post CABG Imaged bony structures of the thorax are
intact.
IMPRESSION: Stable.  Cardiomegaly without acute cardiopulmonary process.

## 2016-07-19 IMAGING — CR DG ABDOMEN 2V
1 series · 2 of 2 positions shown · non-contrast
Comparison: CT abdomen and pelvis 07/15/2012

CLINICAL DATA: Shortness of breath mostly tonight. Currently with
abdominal pain. History of heart disease.

EXAM:
ABDOMEN - 2 VIEW

[Series 1: dxr abdomen 2 v flat and erect · 0.14mm/px · 2 of 2 slices shown]
[im 1/2]
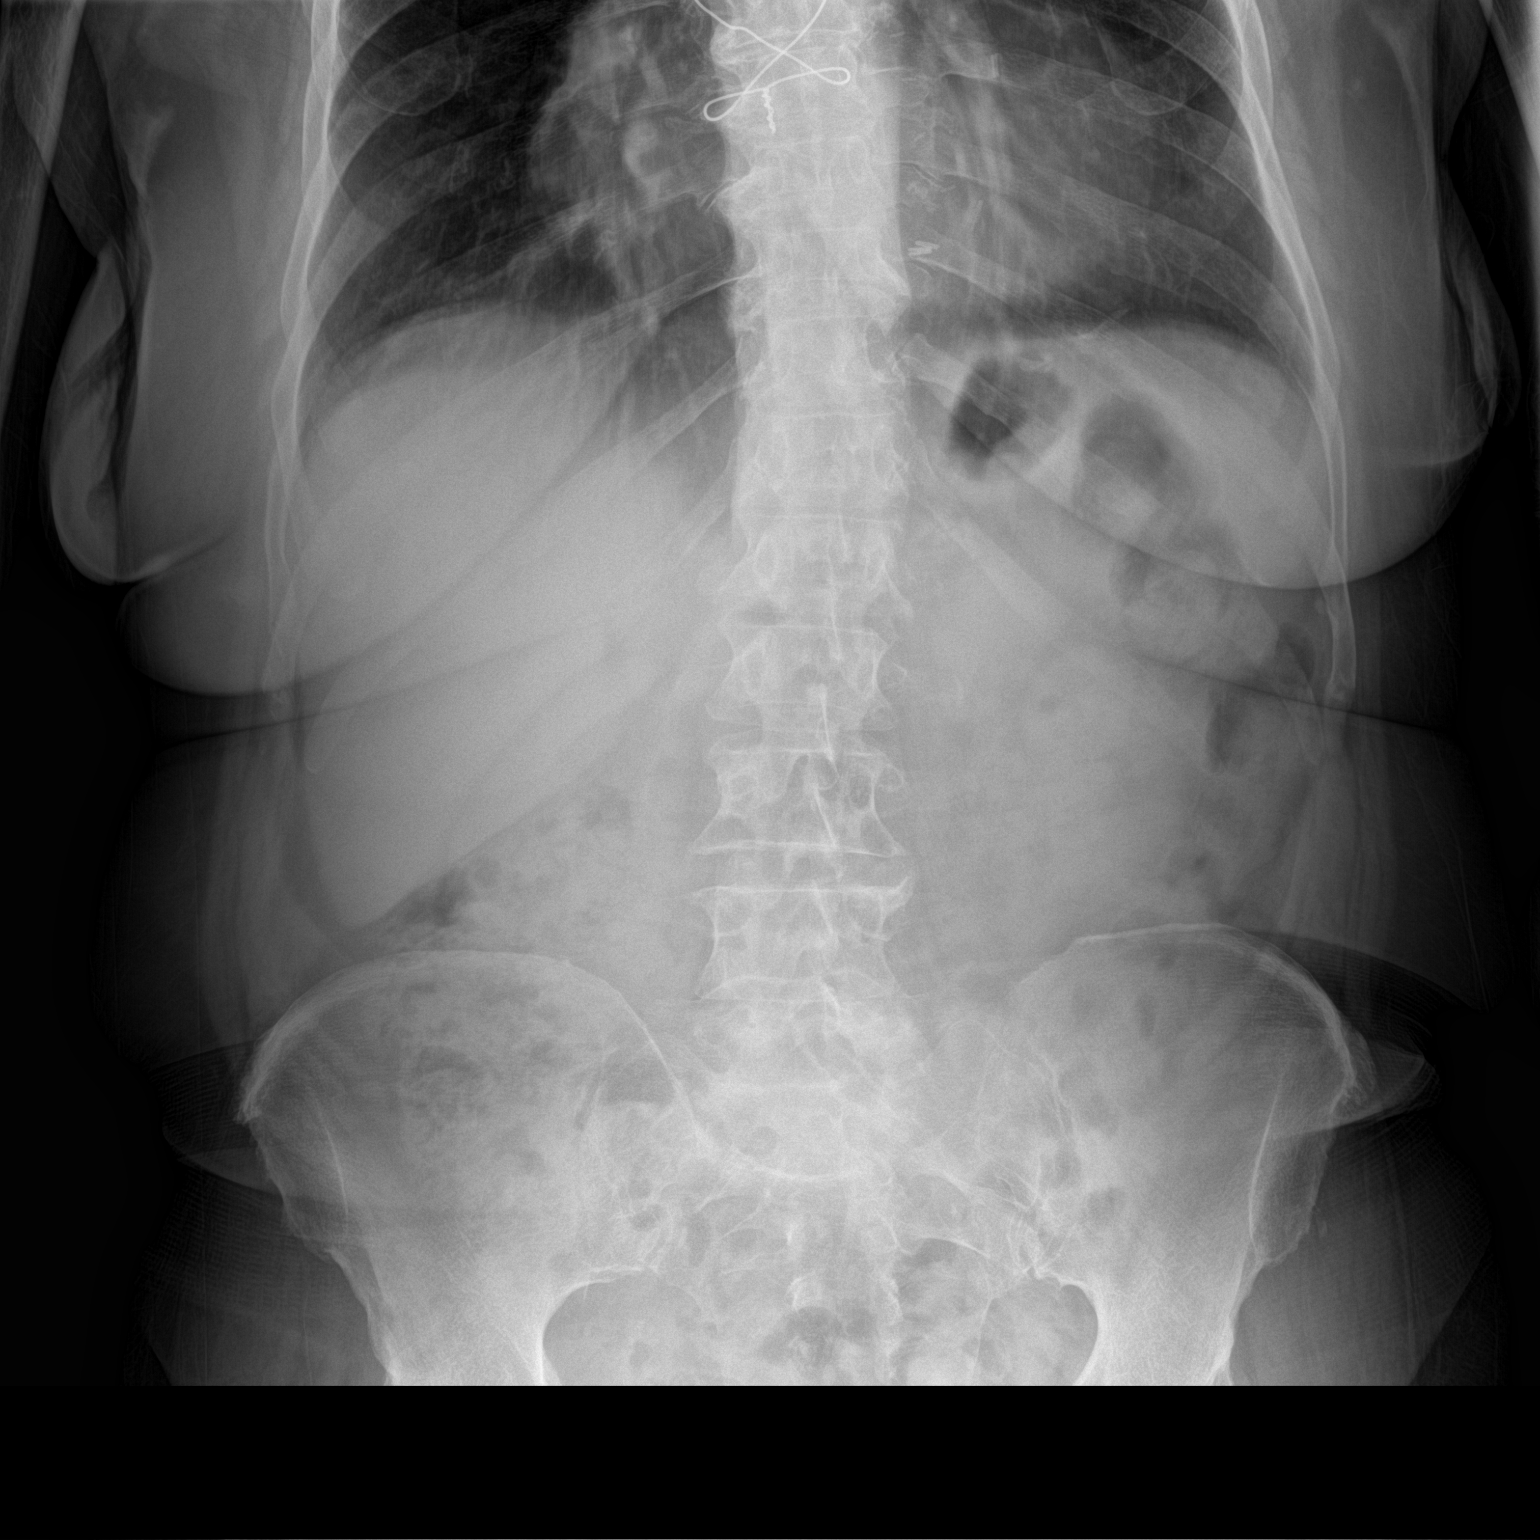
[im 2/2]
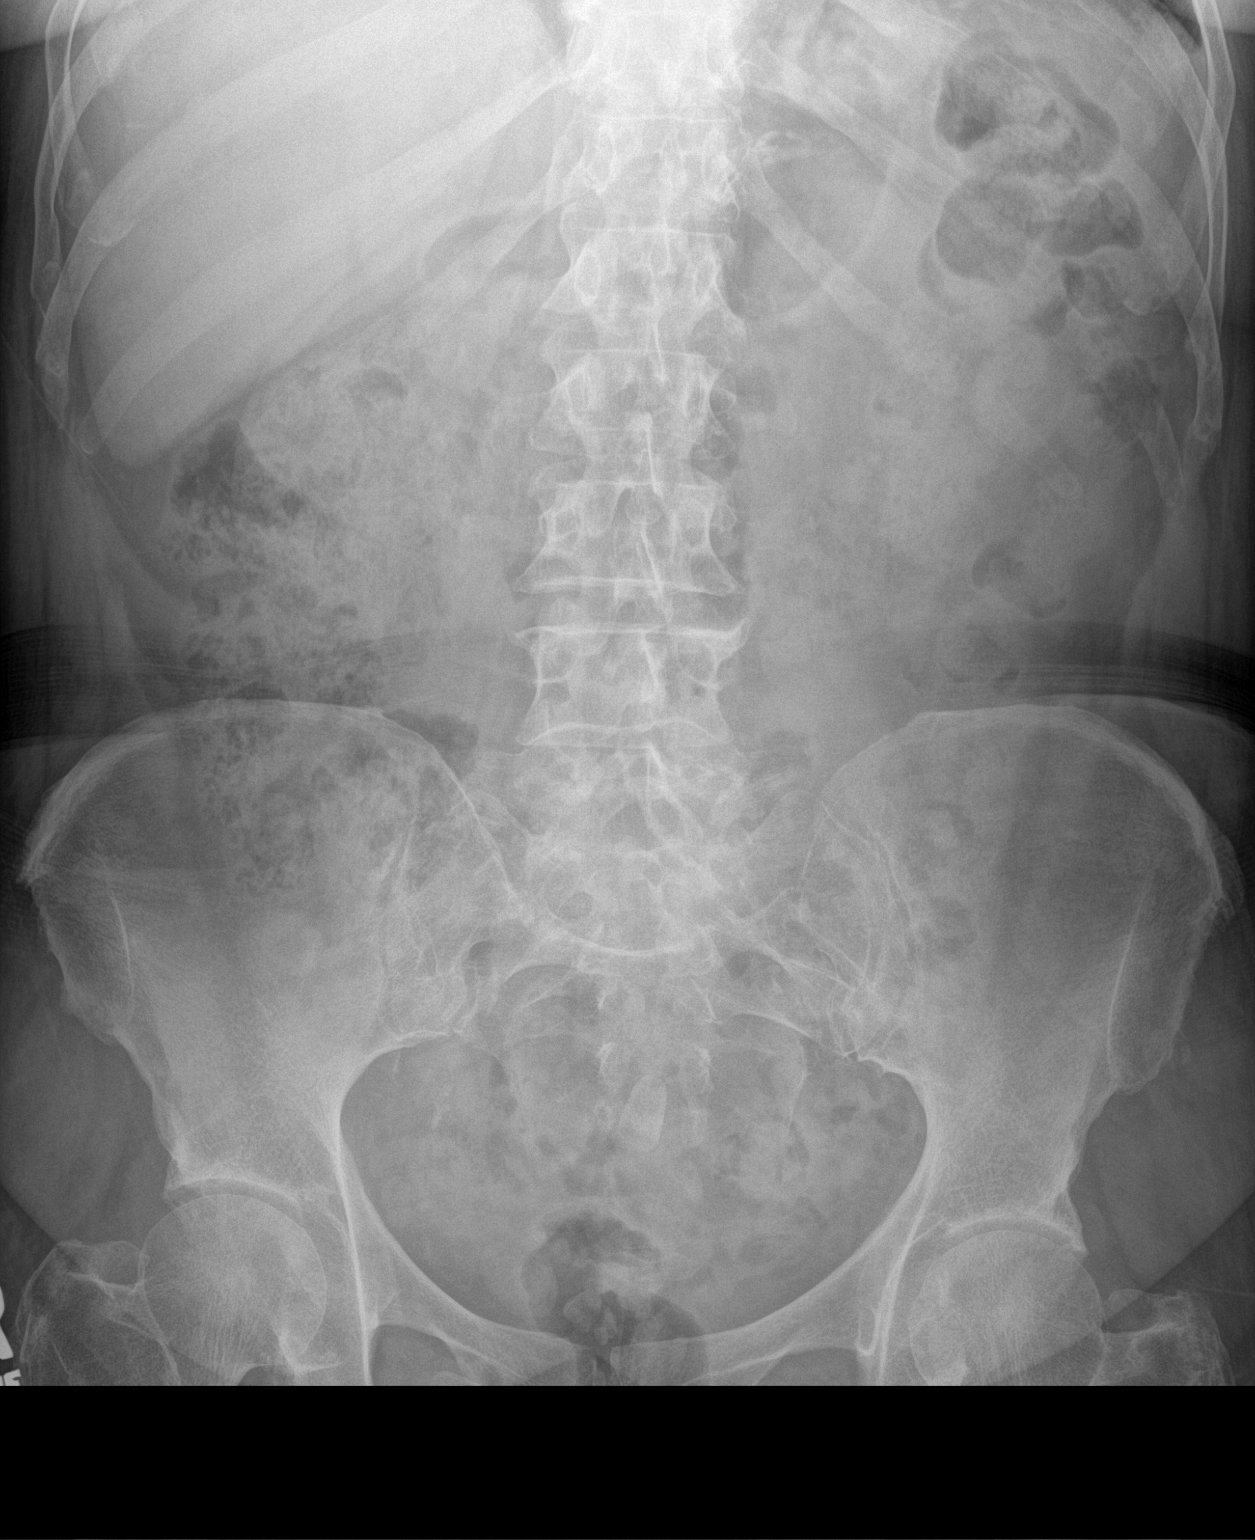

[2 of 2 positions shown; findings below may reference images not displayed]

FINDINGS: Diffusely stool-filled colon. No small or large bowel distention. No
free intra-abdominal air. No abnormal air-fluid levels. No
radiopaque stones. Mild degenerative changes in the spine and hips.
Ovoid densities over the pelvis may represent opaque medication.
IMPRESSION: Normal nonobstructive bowel gas pattern.

## 2016-07-26 ENCOUNTER — Emergency Department: Payer: Medicare Other

## 2016-07-26 ENCOUNTER — Encounter: Payer: Self-pay | Admitting: Emergency Medicine

## 2016-07-26 ENCOUNTER — Inpatient Hospital Stay
Admission: EM | Admit: 2016-07-26 | Discharge: 2016-08-03 | DRG: 291 | Disposition: A | Discharge status: Skilled Nursing Facility | Payer: Medicare Other | Attending: Internal Medicine | Admitting: Internal Medicine

## 2016-07-26 DIAGNOSIS — I2510 Atherosclerotic heart disease of native coronary artery without angina pectoris: Secondary | ICD-10-CM | POA: Diagnosis present

## 2016-07-26 DIAGNOSIS — I5043 Acute on chronic combined systolic (congestive) and diastolic (congestive) heart failure: Secondary | ICD-10-CM | POA: Diagnosis present

## 2016-07-26 DIAGNOSIS — E1122 Type 2 diabetes mellitus with diabetic chronic kidney disease: Secondary | ICD-10-CM | POA: Diagnosis present

## 2016-07-26 DIAGNOSIS — Z7982 Long term (current) use of aspirin: Secondary | ICD-10-CM | POA: Diagnosis not present

## 2016-07-26 DIAGNOSIS — Z951 Presence of aortocoronary bypass graft: Secondary | ICD-10-CM | POA: Diagnosis not present

## 2016-07-26 DIAGNOSIS — Z7189 Other specified counseling: Secondary | ICD-10-CM

## 2016-07-26 DIAGNOSIS — E785 Hyperlipidemia, unspecified: Secondary | ICD-10-CM | POA: Diagnosis present

## 2016-07-26 DIAGNOSIS — I130 Hypertensive heart and chronic kidney disease with heart failure and stage 1 through stage 4 chronic kidney disease, or unspecified chronic kidney disease: Secondary | ICD-10-CM | POA: Diagnosis not present

## 2016-07-26 DIAGNOSIS — I10 Essential (primary) hypertension: Secondary | ICD-10-CM | POA: Diagnosis present

## 2016-07-26 DIAGNOSIS — Z7901 Long term (current) use of anticoagulants: Secondary | ICD-10-CM

## 2016-07-26 DIAGNOSIS — J441 Chronic obstructive pulmonary disease with (acute) exacerbation: Secondary | ICD-10-CM | POA: Diagnosis present

## 2016-07-26 DIAGNOSIS — I248 Other forms of acute ischemic heart disease: Secondary | ICD-10-CM | POA: Diagnosis present

## 2016-07-26 DIAGNOSIS — I482 Chronic atrial fibrillation: Secondary | ICD-10-CM | POA: Diagnosis present

## 2016-07-26 DIAGNOSIS — J209 Acute bronchitis, unspecified: Secondary | ICD-10-CM | POA: Diagnosis present

## 2016-07-26 DIAGNOSIS — N183 Chronic kidney disease, stage 3 (moderate): Secondary | ICD-10-CM | POA: Diagnosis present

## 2016-07-26 DIAGNOSIS — J9621 Acute and chronic respiratory failure with hypoxia: Secondary | ICD-10-CM | POA: Diagnosis present

## 2016-07-26 DIAGNOSIS — Z515 Encounter for palliative care: Secondary | ICD-10-CM

## 2016-07-26 DIAGNOSIS — E1136 Type 2 diabetes mellitus with diabetic cataract: Secondary | ICD-10-CM | POA: Diagnosis present

## 2016-07-26 DIAGNOSIS — F419 Anxiety disorder, unspecified: Secondary | ICD-10-CM | POA: Diagnosis present

## 2016-07-26 DIAGNOSIS — Z79899 Other long term (current) drug therapy: Secondary | ICD-10-CM | POA: Diagnosis not present

## 2016-07-26 DIAGNOSIS — F0390 Unspecified dementia without behavioral disturbance: Secondary | ICD-10-CM | POA: Diagnosis not present

## 2016-07-26 DIAGNOSIS — R0902 Hypoxemia: Secondary | ICD-10-CM

## 2016-07-26 DIAGNOSIS — J440 Chronic obstructive pulmonary disease with acute lower respiratory infection: Secondary | ICD-10-CM | POA: Diagnosis present

## 2016-07-26 DIAGNOSIS — I509 Heart failure, unspecified: Secondary | ICD-10-CM

## 2016-07-26 DIAGNOSIS — R0602 Shortness of breath: Secondary | ICD-10-CM

## 2016-07-26 DIAGNOSIS — F03A0 Unspecified dementia, mild, without behavioral disturbance, psychotic disturbance, mood disturbance, and anxiety: Secondary | ICD-10-CM | POA: Diagnosis present

## 2016-07-26 DIAGNOSIS — I5023 Acute on chronic systolic (congestive) heart failure: Secondary | ICD-10-CM | POA: Diagnosis present

## 2016-07-26 DIAGNOSIS — E119 Type 2 diabetes mellitus without complications: Secondary | ICD-10-CM

## 2016-07-26 LAB — CBC
HCT: 31.8 % — ABNORMAL LOW (ref 35.0–47.0)
HEMOGLOBIN: 10 g/dL — AB (ref 12.0–16.0)
MCH: 23.5 pg — AB (ref 26.0–34.0)
MCHC: 31.4 g/dL — AB (ref 32.0–36.0)
MCV: 74.9 fL — ABNORMAL LOW (ref 80.0–100.0)
Platelets: 316 10*3/uL (ref 150–440)
RBC: 4.25 MIL/uL (ref 3.80–5.20)
RDW: 19.5 % — ABNORMAL HIGH (ref 11.5–14.5)
WBC: 12.6 10*3/uL — ABNORMAL HIGH (ref 3.6–11.0)

## 2016-07-26 LAB — BASIC METABOLIC PANEL
ANION GAP: 11 (ref 5–15)
BUN: 18 mg/dL (ref 6–20)
CALCIUM: 9.1 mg/dL (ref 8.9–10.3)
CO2: 23 mmol/L (ref 22–32)
Chloride: 105 mmol/L (ref 101–111)
Creatinine, Ser: 1.16 mg/dL — ABNORMAL HIGH (ref 0.44–1.00)
GFR, EST AFRICAN AMERICAN: 52 mL/min — AB (ref >60)
GFR, EST NON AFRICAN AMERICAN: 45 mL/min — AB (ref >60)
Glucose, Bld: 178 mg/dL — ABNORMAL HIGH (ref 65–99)
Potassium: 3.6 mmol/L (ref 3.5–5.1)
Sodium: 139 mmol/L (ref 135–145)

## 2016-07-26 LAB — BRAIN NATRIURETIC PEPTIDE: B NATRIURETIC PEPTIDE 5: 2498 pg/mL — AB (ref 0.0–100.0)

## 2016-07-26 LAB — TROPONIN I: TROPONIN I: 0.05 ng/mL — AB (ref <0.03)

## 2016-07-26 MED ORDER — METHYLPREDNISOLONE SODIUM SUCC 125 MG IJ SOLR
125.0000 mg | Freq: Once | INTRAMUSCULAR | Status: AC
  Administered 2016-07-26: 125 mg via INTRAVENOUS
  Filled 2016-07-26: qty 2

## 2016-07-26 MED ORDER — IPRATROPIUM-ALBUTEROL 0.5-2.5 (3) MG/3ML IN SOLN
3.0000 mL | Freq: Once | RESPIRATORY_TRACT | Status: AC
  Administered 2016-07-26: 3 mL via RESPIRATORY_TRACT
  Filled 2016-07-26: qty 3

## 2016-07-26 MED ORDER — ONDANSETRON HCL 4 MG/2ML IJ SOLN
INTRAMUSCULAR | Status: AC
  Filled 2016-07-26: qty 2

## 2016-07-26 MED ORDER — ALBUTEROL SULFATE (2.5 MG/3ML) 0.083% IN NEBU
5.0000 mg | INHALATION_SOLUTION | Freq: Once | RESPIRATORY_TRACT | Status: AC
  Administered 2016-07-26: 5 mg via RESPIRATORY_TRACT
  Filled 2016-07-26: qty 6

## 2016-07-26 MED ORDER — FUROSEMIDE 10 MG/ML IJ SOLN
60.0000 mg | Freq: Once | INTRAMUSCULAR | Status: AC
  Administered 2016-07-26: 60 mg via INTRAVENOUS
  Filled 2016-07-26 (×2): qty 8

## 2016-07-26 MED ORDER — ONDANSETRON HCL 4 MG/2ML IJ SOLN
4.0000 mg | Freq: Once | INTRAMUSCULAR | Status: AC
  Administered 2016-07-26: 4 mg via INTRAVENOUS

## 2016-07-26 NOTE — ED Provider Notes (Signed)
[facility] Emergency Department Provider Note  Time seen: 11:02 PM  I have reviewed the triage vital signs and the nursing notes.   HISTORY  Chief Complaint Shortness of Breath    HPI Robin [name] is a 76 y.o. female with a past medical history of atrial fibrillation, anxiety, asthma, CHF, diabetes, hypertension, hyperlipidemia, presents to the emergency department with significant shortness of breath. According to the patient of the past 2 days she has felt significant shortness of breath especially with any type of exertion even if minimal. States mild chest pressure/tightness but denies any "pain." Patient has frequent admissions for difficulty breathing and CHF. Patient was last admitted to [facility] approximately 10 days ago. Patient states she had been doing well at home until yesterday when she once again began feeling significantly short of breath. Patient does not wear oxygen at home currently satting 89-91 percent on room air.  Past Medical History:  Diagnosis Date  . Afib (HCC)   . Anxiety   . Asthma   . CHF (congestive heart failure) (HCC)   . Coronary artery disease   . Diabetes mellitus without complication (HCC)   . Hyperlipidemia   . Hypertension     Patient Active Problem List   Diagnosis Date Noted  . Abdominal pain 06/07/2016  . Transaminitis   . Atrial fibrillation with RVR (HCC) 05/03/2016  . Demand ischemia (HCC) 05/03/2016  . Moderate aortic stenosis 05/03/2016  . Moderate mitral regurgitation 05/03/2016  . History of noncompliance with medical treatment 05/03/2016  . Congestive dilated cardiomyopathy (HCC) 05/03/2016  . Chest pain 05/02/2016  . 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 Renal artery stenosis (HCC) 02/28/2016  . Accelerated hypertension 02/28/2016  . Mild dementia 02/28/2016  . Noncompliance 02/07/2016  . Renal infarct (HCC) 02/06/2016  . Atrial fibrillation with rapid ventricular response (HCC)  02/06/2016  . A-fib (HCC) 01/24/2016  . HTN (hypertension) 11/04/2015  . HLD (hyperlipidemia) 11/04/2015  . CAD (coronary artery disease) 11/04/2015  . Chronic systolic CHF (congestive heart failure) (HCC) 11/04/2015  . Diabetes (HCC) 11/04/2015    Past Surgical History:  Procedure Laterality Date  . CORONARY ARTERY BYPASS GRAFT    . NO PAST SURGERIES    . PERIPHERAL VASCULAR CATHETERIZATION Left 03/02/2016   Procedure: Renal Intervention;  Surgeon: Renford Dills, MD;  Location: [facility] INVASIVE CV LAB;  Service: Cardiovascular;  Laterality: Left;    Prior to Admission medications   Medication Sig Start Date End Date Taking? Authorizing Provider  albuterol (ACCUNEB) 1.25 MG/3ML nebulizer solution Take 1.25 mg by nebulization every 4 (four) hours as needed for wheezing or shortness of breath.     [provider]  aspirin 81 MG chewable tablet Chew 81 mg by mouth daily. 04/13/16   [provider]  atorvastatin (LIPITOR) 80 MG tablet Take 1 tablet (80 mg total) by mouth daily at 6 PM. 03/05/16   Enedina Finner, MD  diltiazem (CARDIZEM CD) 180 MG 24 hr capsule Take 1 capsule (180 mg total) by mouth daily. 03/05/16   Enedina Finner, MD  furosemide (LASIX) 20 MG tablet Take 20 mg by mouth every morning.    [provider]  glipiZIDE (GLUCOTROL) 5 MG tablet Take 0.5 tablets (2.5 mg total) by mouth daily before breakfast. 03/06/16   Enedina Finner, MD  LORazepam (ATIVAN) 1 MG tablet Take 1 tablet (1 mg total) by mouth 2 (two) times daily. 05/05/16 05/05/17  Emily Filbert, MD  meclizine (ANTIVERT) 25 MG tablet  Take 1 tablet (25 mg total) by mouth 3 (three) times daily as needed for dizziness. 11/06/15   Enid Baas, MD  metoprolol (LOPRESSOR) 100 MG tablet Take 1 tablet (100 mg total) by mouth 3 (three) times daily. 06/09/16   Gouru, Deanna Artis, MD  mometasone-formoterol (DULERA) 200-5 MCG/ACT AERO Inhale 2 puffs into the lungs 2 (two) times daily. 03/05/16   Enedina Finner, MD   nitroGLYCERIN (NITROSTAT) 0.4 MG SL tablet Place 1 tablet (0.4 mg total) under the tongue every 5 (five) minutes as needed for chest pain. 01/25/16   Adrian Saran, MD  ondansetron (ZOFRAN ODT) 4 MG disintegrating tablet Allow 1-2 tablets to dissolve in your mouth every 8 hours as needed for nausea/vomiting 06/14/16   Loleta Rose, MD  oxyCODONE-acetaminophen (PERCOCET/ROXICET) 5-325 MG tablet Take 1 tablet by mouth every 4 (four) hours as needed for moderate pain or severe pain. 03/05/16   Enedina Finner, MD  pantoprazole (PROTONIX) 40 MG tablet Take 1 tablet (40 mg total) by mouth daily. Patient taking differently: Take 40 mg by mouth daily as needed. For acid reflux 11/06/15   Enid Baas, MD  rivaroxaban (XARELTO) 20 MG TABS tablet Take 1 tablet (20 mg total) by mouth daily with supper. 06/13/16   Emily Filbert, MD    Allergies  Allergen Reactions  . Morphine And Related Other (See Comments)    Patient states it makes her "space out"    Family History  Problem Relation Age of Onset  . Hypertension Mother   . Heart failure Mother   . Heart disease Father     Social History Social History  Substance Use Topics  . Smoking status: Never Smoker  . Smokeless tobacco: Never Used  . Alcohol use No    Review of Systems Constitutional: Negative for fever. Eyes: Negative for visual changes. ENT: Negative for congestion Cardiovascular: Mild chest tightness Respiratory: Moderate shortness of breath Gastrointestinal: Negative for abdominal pain, vomiting Genitourinary: Negative for dysuria. Musculoskeletal: Negative for back pain. Skin: Negative for rash. Neurological: Negative for headache All other ROS negative  ____________________________________________   PHYSICAL EXAM:  VITAL SIGNS: ED Triage Vitals  Enc Vitals Group     BP 07/26/16 1952 (!) 197/77     Pulse Rate 07/26/16 2228 (!) 120     Resp 07/26/16 1952 16     Temp 07/26/16 1952 97.9 F (36.6 C)     Temp  Source 07/26/16 1952 Oral     SpO2 07/26/16 1952 98 %     Weight 07/26/16 1954 146 lb (66.2 kg)     Height 07/26/16 1954 5\' 4"  (1.626 m)     Head Circumference --      Peak Flow --      Pain Score 07/26/16 2004 10     Pain Loc --      Pain Edu? --      Excl. in GC? --     Constitutional: Alert and oriented. Moderate distress due to shortness of breath. Sitting upright in bed. Eyes: Normal exam ENT   Head: Normocephalic and atraumatic.   Mouth/Throat: Mucous membranes are moist. Cardiovascular: Normal rate, regular rhythm. No murmur Respiratory: Mild tachypnea. Mild expiratory wheezes, decreased breath sounds bilaterally. Gastrointestinal: Soft and nontender. No distention.  Musculoskeletal: Nontender with normal range of motion in all extremities.  Neurologic:  Normal speech and language. No gross focal neurologic deficits  Skin:  Skin is warm, dry and intact.  Psychiatric: Mood and affect are normal.  ____________________________________________  EKG  EKG reviewed and interpreted by myself shows normal sinus rhythm at 96 bpm, narrow QRS, left axis deviation, normal intervals and nonspecific ST changes.  ____________________________________________    RADIOLOGY  Chest x-ray shows cardiomegaly with diffuse interstitial infiltrates likely pulmonary edema.  ____________________________________________   INITIAL IMPRESSION / ASSESSMENT AND PLAN / ED COURSE  Pertinent labs & imaging results that were available during my care of the patient were reviewed by me and considered in my medical decision making (see chart for details).  Patient presents to the emergency department moderate shortness of breath, and mild to moderate distress sitting upright in bed. Patient is tachycardic, room air pulse ox of 89-91 percent. Patient has no home O2 requirement at baseline. Patient's chest x-ray shows diffuse interstitial infiltrates likely due to pulmonary edema as well as small  pleural effusions. Patient BNP is elevated at 2400, troponin is mildly elevated which is baseline. Given the patient's miotics or wheeze we'll treat with DuoNeb's and Solu-Medrol. Given the patient's pulmonary edema on chest x-ray with hypoxia we will treat with IV Lasix. Patient will require admission to the hospital for further treatment.  ____________________________________________   FINAL CLINICAL IMPRESSION(S) / ED DIAGNOSES  Dyspnea CHF exacerbation     Minna Antis, MD 07/26/16 2308

## 2016-07-26 NOTE — ED Notes (Signed)
Report off to allison rn  

## 2016-07-26 NOTE — ED Notes (Signed)
Robin [name] [phone]

## 2016-07-26 NOTE — ED Triage Notes (Signed)
Pt presents to ED c/o shortness of breath since this morning accompanied by mid chest pain. Audible wheezing heard, respirations even and unlabored.

## 2016-07-26 NOTE — ED Notes (Signed)
Patient transported to X-ray via stretcher 

## 2016-07-26 NOTE — ED Notes (Signed)
Pt reports sob for 1 day and pt reports chest pain. Pain radiates into back.  Pt has nausea.  No cough or fever.  Pt alert.  Pt placed on 2 liters oxygen.  Sinus tach on monitor.

## 2016-07-26 NOTE — H&P (Signed)
[facility] - [place] at [facility]   PATIENT NAME: Robin [name]    MR#:  [id number]  DATE OF BIRTH:  10/20/1940  DATE OF ADMISSION:  07/26/2016  PRIMARY CARE PHYSICIAN: Leanna Sato, MD   REQUESTING/REFERRING PHYSICIAN: Lenard Lance, MD  CHIEF COMPLAINT:   Chief Complaint  Patient presents with  . Shortness of Breath    HISTORY OF PRESENT ILLNESS:  Robin [name]  is a 76 y.o. female who presents with Significant shortness of breath. Patient has known history of severe CHF, and has been admitted in the past with similar symptoms. However, she states that tonight symptoms are much worse. She has a difficult time speaking in complete sentences. Workup in the ED is indicative of CHF, however also potentially some COPD. She does not have a diagnosis of COPD on the chart, but she does use Dulera at home. She was treated with IV Lasix, as well as COPD exacerbation treatment. Hospitalists were called for admission  PAST MEDICAL HISTORY:   Past Medical History:  Diagnosis Date  . Afib (HCC)   . Anxiety   . Asthma   . CHF (congestive heart failure) (HCC)   . Coronary artery disease   . Diabetes mellitus without complication (HCC)   . Hyperlipidemia   . Hypertension     PAST SURGICAL HISTORY:   Past Surgical History:  Procedure Laterality Date  . CORONARY ARTERY BYPASS GRAFT    . NO PAST SURGERIES    . PERIPHERAL VASCULAR CATHETERIZATION Left 03/02/2016   Procedure: Renal Intervention;  Surgeon: Renford Dills, MD;  Location: [facility] INVASIVE CV LAB;  Service: Cardiovascular;  Laterality: Left;    SOCIAL HISTORY:   Social History  Substance Use Topics  . Smoking status: Never Smoker  . Smokeless tobacco: Never Used  . Alcohol use No    FAMILY HISTORY:   Family History  Problem Relation Age of Onset  . Hypertension Mother   . Heart failure Mother   . Heart disease Father     DRUG ALLERGIES:   Allergies  Allergen Reactions  .  Morphine And Related Other (See Comments)    Patient states it makes her "space out"    MEDICATIONS AT HOME:   Prior to Admission medications   Medication Sig Start Date End Date Taking? Authorizing Provider  albuterol (ACCUNEB) 1.25 MG/3ML nebulizer solution Take 1.25 mg by nebulization every 4 (four) hours as needed for wheezing or shortness of breath.     [provider]  aspirin 81 MG chewable tablet Chew 81 mg by mouth daily. 04/13/16   [provider]  atorvastatin (LIPITOR) 80 MG tablet Take 1 tablet (80 mg total) by mouth daily at 6 PM. 03/05/16   Enedina Finner, MD  diltiazem (CARDIZEM CD) 180 MG 24 hr capsule Take 1 capsule (180 mg total) by mouth daily. 03/05/16   Enedina Finner, MD  furosemide (LASIX) 20 MG tablet Take 20 mg by mouth every morning.    [provider]  glipiZIDE (GLUCOTROL) 5 MG tablet Take 0.5 tablets (2.5 mg total) by mouth daily before breakfast. 03/06/16   Enedina Finner, MD  LORazepam (ATIVAN) 1 MG tablet Take 1 tablet (1 mg total) by mouth 2 (two) times daily. 05/05/16 05/05/17  Emily Filbert, MD  meclizine (ANTIVERT) 25 MG tablet Take 1 tablet (25 mg total) by mouth 3 (three) times daily as needed for dizziness. 11/06/15   Enid Baas, MD  metoprolol (LOPRESSOR) 100 MG tablet Take 1 tablet (  100 mg total) by mouth 3 (three) times daily. 06/09/16   Gouru, Deanna Artis, MD  mometasone-formoterol (DULERA) 200-5 MCG/ACT AERO Inhale 2 puffs into the lungs 2 (two) times daily. 03/05/16   Enedina Finner, MD  nitroGLYCERIN (NITROSTAT) 0.4 MG SL tablet Place 1 tablet (0.4 mg total) under the tongue every 5 (five) minutes as needed for chest pain. 01/25/16   Adrian Saran, MD  ondansetron (ZOFRAN ODT) 4 MG disintegrating tablet Allow 1-2 tablets to dissolve in your mouth every 8 hours as needed for nausea/vomiting 06/14/16   Loleta Rose, MD  oxyCODONE-acetaminophen (PERCOCET/ROXICET) 5-325 MG tablet Take 1 tablet by mouth every 4 (four) hours as needed for  moderate pain or severe pain. 03/05/16   Enedina Finner, MD  pantoprazole (PROTONIX) 40 MG tablet Take 1 tablet (40 mg total) by mouth daily. Patient taking differently: Take 40 mg by mouth daily as needed. For acid reflux 11/06/15   Enid Baas, MD  rivaroxaban (XARELTO) 20 MG TABS tablet Take 1 tablet (20 mg total) by mouth daily with supper. 06/13/16   Emily Filbert, MD    REVIEW OF SYSTEMS:  Review of Systems  Constitutional: Negative for chills, fever, malaise/fatigue and weight loss.  HENT: Negative for ear pain, hearing loss and tinnitus.   Eyes: Negative for blurred vision, double vision, pain and redness.  Respiratory: Positive for cough, shortness of breath and wheezing. Negative for hemoptysis.   Cardiovascular: Negative for chest pain, palpitations, orthopnea and leg swelling.  Gastrointestinal: Negative for abdominal pain, constipation, diarrhea, nausea and vomiting.  Genitourinary: Negative for dysuria, frequency and hematuria.  Musculoskeletal: Negative for back pain, joint pain and neck pain.  Skin:       No acne, rash, or lesions  Neurological: Negative for dizziness, tremors, focal weakness and weakness.  Endo/Heme/Allergies: Negative for polydipsia. Does not bruise/bleed easily.  Psychiatric/Behavioral: Negative for depression. The patient is not nervous/anxious and does not have insomnia.      VITAL SIGNS:   Vitals:   07/26/16 1952 07/26/16 1954 07/26/16 2205 07/26/16 2228  BP: (!) 197/77  (!) 171/115   Pulse:    (!) 120  Resp: 16  19 20   Temp: 97.9 F (36.6 C)     TempSrc: Oral     SpO2: 98%   97%  Weight:  66.2 kg (146 lb)    Height:  5\' 4"  (1.626 m)     Wt Readings from Last 3 Encounters:  07/26/16 66.2 kg (146 lb)  06/23/16 63.5 kg (140 lb)  06/14/16 63.5 kg (140 lb)    PHYSICAL EXAMINATION:  Physical Exam  LABORATORY PANEL:   CBC  Recent Labs Lab 07/26/16 2056  WBC 12.6*  HGB 10.0*  HCT 31.8*  PLT 316    ------------------------------------------------------------------------------------------------------------------  Chemistries   Recent Labs Lab 07/26/16 2056  NA 139  K 3.6  CL 105  CO2 23  GLUCOSE 178*  BUN 18  CREATININE 1.16*  CALCIUM 9.1   ------------------------------------------------------------------------------------------------------------------  Cardiac Enzymes  Recent Labs Lab 07/26/16 2056  TROPONINI 0.05*   ------------------------------------------------------------------------------------------------------------------  RADIOLOGY:  Dg Chest 2 View  Result Date: 07/26/2016 CLINICAL DATA:  Chest pain and shortness of breath beginning this morning. Wheezing. EXAM: CHEST  2 VIEW COMPARISON:  06/12/2016 FINDINGS: Cardiomegaly remains stable. Diffuse interstitial infiltrates have increased since previous study, likely due to pulmonary edema. Tiny bilateral pleural effusions seen. No evidence of pulmonary consolidation. Prior CABG again noted IMPRESSION: Mild congestive heart failure and tiny bilateral pleural effusions. Electronically Signed  By: Myles Rosenthal M.D.   On: 07/26/2016 20:28    EKG:   Orders placed or performed during the hospital encounter of 07/26/16  . ED EKG within 10 minutes  . ED EKG within 10 minutes    IMPRESSION AND PLAN:  Principal Problem:   Acute on chronic systolic CHF (congestive heart failure) (HCC) - patient has last known EF of around 25%, small pleural effusions and mild vascular congestion on chest x-ray, BNP is 2500, which is down from her prior value of nearly 4000 and seems to potentially be about her baseline. However, she has significant respiratory distress tonight. She was treated with IV Lasix, as well as Solu-Medrol and duo nebs. She has no documented history of COPD and states she is a never smoker, but is requiring significant respiratory support.  We will repeat her echocardiogram in the morning and get her  cardiologist to see her, continue other treatment measures as above Active Problems:   HTN (hypertension) - stable, elevated, IV Lasix as above as well as home dose antihypertensives   CAD (coronary artery disease) - continue home meds   Diabetes (HCC) - sliding scale insulin with corresponding glucose checks   HLD (hyperlipidemia) - continue home meds  All the records are reviewed and case discussed with ED provider. Management plans discussed with the patient and/or family.  DVT PROPHYLAXIS: Systemic anticoagulation  GI PROPHYLAXIS: PPI  ADMISSION STATUS: Inpatient  CODE STATUS: Full Code Status History    Date Active Date Inactive Code Status Order ID Comments User Context   06/07/2016  9:35 PM 06/09/2016  5:15 PM Full Code [id number]  Auburn Bilberry, MD ED   05/02/2016  4:56 PM 05/03/2016  8:01 PM Full Code [id number]  Houston Siren, MD Inpatient   03/02/2016  3:34 PM 03/05/2016  4:16 PM Full Code [id number]  Schnier, Latina Craver, MD Inpatient   02/28/2016  2:43 AM 03/02/2016  3:33 PM Full Code [id number]  Oralia Manis, MD Inpatient   02/06/2016  7:17 PM 02/10/2016 11:14 PM Full Code [id number]  Hower, Cletis Athens, MD ED   01/24/2016  2:00 PM 01/25/2016  7:15 PM Full Code [id number]  Auburn Bilberry, MD Inpatient   11/25/2015  3:40 PM 11/26/2015  3:49 PM Full Code [id number]  Houston Siren, MD Inpatient   11/05/2015 12:33 AM 11/06/2015  6:05 PM Full Code [id number]  Oralia Manis, MD Inpatient   09/13/2015 10:10 AM 09/15/2015  7:33 PM Full Code [id number]  Altamese Dilling, MD Inpatient      TOTAL TIME TAKING CARE OF THIS PATIENT: 45 minutes.   Shamon Lobo FIELDING 07/26/2016, 11:48 PM  [facility]  Office  [phone]  CC: Primary care physician; Leanna Sato, MD  Note:  This document was prepared using Dragon voice recognition software and may include unintentional dictation errors.

## 2016-07-27 ENCOUNTER — Inpatient Hospital Stay
Admit: 2016-07-27 | Discharge: 2016-07-27 | Disposition: A | Discharge status: Home / Self Care | Payer: Medicare Other | Attending: Internal Medicine | Admitting: Internal Medicine

## 2016-07-27 DIAGNOSIS — I5023 Acute on chronic systolic (congestive) heart failure: Secondary | ICD-10-CM

## 2016-07-27 LAB — BLOOD GAS, ARTERIAL
Acid-Base Excess: 1.7 mmol/L (ref 0.0–2.0)
Acid-base deficit: 6.9 mmol/L — ABNORMAL HIGH (ref 0.0–2.0)
BICARBONATE: 27.7 mmol/L (ref 20.0–28.0)
Bicarbonate: 20.5 mmol/L (ref 20.0–28.0)
Delivery systems: POSITIVE
Expiratory PAP: 5
FIO2: 0.28
FIO2: 0.32
INSPIRATORY PAP: 10
MECHANICAL RATE: 8
O2 SAT: 97.2 %
O2 Saturation: 87.9 %
PATIENT TEMPERATURE: 37
PH ART: 7.23 — AB (ref 7.350–7.450)
PH ART: 7.36 (ref 7.350–7.450)
Patient temperature: 37
pCO2 arterial: 49 mmHg — ABNORMAL HIGH (ref 32.0–48.0)
pCO2 arterial: 49 mmHg — ABNORMAL HIGH (ref 32.0–48.0)
pO2, Arterial: 65 mmHg — ABNORMAL LOW (ref 83.0–108.0)
pO2, Arterial: 96 mmHg (ref 83.0–108.0)

## 2016-07-27 LAB — BASIC METABOLIC PANEL
Anion gap: 12 (ref 5–15)
BUN: 19 mg/dL (ref 6–20)
CALCIUM: 8.8 mg/dL — AB (ref 8.9–10.3)
CHLORIDE: 105 mmol/L (ref 101–111)
CO2: 22 mmol/L (ref 22–32)
CREATININE: 1.32 mg/dL — AB (ref 0.44–1.00)
GFR calc non Af Amer: 38 mL/min — ABNORMAL LOW (ref >60)
GFR, EST AFRICAN AMERICAN: 45 mL/min — AB (ref >60)
GLUCOSE: 366 mg/dL — AB (ref 65–99)
Potassium: 4.2 mmol/L (ref 3.5–5.1)
Sodium: 139 mmol/L (ref 135–145)

## 2016-07-27 LAB — GLUCOSE, CAPILLARY
GLUCOSE-CAPILLARY: 330 mg/dL — AB (ref 65–99)
GLUCOSE-CAPILLARY: 131 mg/dL — AB (ref 65–99)
Glucose-Capillary: 331 mg/dL — ABNORMAL HIGH (ref 65–99)
Glucose-Capillary: 371 mg/dL — ABNORMAL HIGH (ref 65–99)
Glucose-Capillary: 109 mg/dL — ABNORMAL HIGH (ref 65–99)

## 2016-07-27 LAB — CBC
HCT: 31.9 % — ABNORMAL LOW (ref 35.0–47.0)
Hemoglobin: 9.8 g/dL — ABNORMAL LOW (ref 12.0–16.0)
MCH: 23.2 pg — ABNORMAL LOW (ref 26.0–34.0)
MCHC: 30.6 g/dL — ABNORMAL LOW (ref 32.0–36.0)
MCV: 75.7 fL — AB (ref 80.0–100.0)
Platelets: 321 10*3/uL (ref 150–440)
RBC: 4.21 MIL/uL (ref 3.80–5.20)
RDW: 20 % — ABNORMAL HIGH (ref 11.5–14.5)
WBC: 19.9 10*3/uL — ABNORMAL HIGH (ref 3.6–11.0)

## 2016-07-27 LAB — PROCALCITONIN: Procalcitonin: 4.44 ng/mL

## 2016-07-27 LAB — MRSA PCR SCREENING: MRSA BY PCR: NEGATIVE

## 2016-07-27 LAB — TROPONIN I
TROPONIN I: 0.07 ng/mL — AB (ref <0.03)
TROPONIN I: 0.43 ng/mL — AB (ref <0.03)
Troponin I: 1.01 ng/mL (ref <0.03)

## 2016-07-27 MED ORDER — ONDANSETRON HCL 4 MG PO TABS: 4.0000 mg | ORAL_TABLET | Freq: Four times a day (QID) | ORAL | Status: DC | PRN

## 2016-07-27 MED ORDER — LORAZEPAM 1 MG PO TABS
1.0000 mg | ORAL_TABLET | Freq: Two times a day (BID) | ORAL | Status: DC
  Administered 2016-07-27 – 2016-07-29 (×6): 1 mg via ORAL
  Filled 2016-07-27 (×7): qty 1

## 2016-07-27 MED ORDER — SODIUM BICARBONATE 8.4 % IV SOLN
INTRAVENOUS | Status: DC
  Administered 2016-07-27: 04:00:00 via INTRAVENOUS
  Filled 2016-07-27: qty 150

## 2016-07-27 MED ORDER — METHYLPREDNISOLONE SODIUM SUCC 40 MG IJ SOLR
40.0000 mg | Freq: Two times a day (BID) | INTRAMUSCULAR | Status: DC
  Administered 2016-07-27 – 2016-07-30 (×7): 40 mg via INTRAVENOUS
  Filled 2016-07-27 (×7): qty 1

## 2016-07-27 MED ORDER — ATORVASTATIN CALCIUM 20 MG PO TABS
80.0000 mg | ORAL_TABLET | Freq: Every day | ORAL | Status: DC
  Administered 2016-07-27 – 2016-08-02 (×6): 80 mg via ORAL
  Filled 2016-07-27 (×8): qty 4

## 2016-07-27 MED ORDER — AZITHROMYCIN 250 MG PO TABS
500.0000 mg | ORAL_TABLET | Freq: Every day | ORAL | Status: DC
  Administered 2016-07-28 – 2016-07-30 (×3): 500 mg via ORAL
  Filled 2016-07-27 (×2): qty 2
  Filled 2016-07-27 (×2): qty 1

## 2016-07-27 MED ORDER — OXYCODONE-ACETAMINOPHEN 5-325 MG PO TABS
1.0000 | ORAL_TABLET | ORAL | Status: DC | PRN
  Administered 2016-07-28 – 2016-07-31 (×3): 1 via ORAL
  Filled 2016-07-27 (×3): qty 1

## 2016-07-27 MED ORDER — ACETAMINOPHEN 650 MG RE SUPP: 650.0000 mg | Freq: Four times a day (QID) | RECTAL | Status: DC | PRN

## 2016-07-27 MED ORDER — OXYCODONE-ACETAMINOPHEN 5-325 MG PO TABS
ORAL_TABLET | ORAL | Status: AC
  Administered 2016-07-27: 1
  Filled 2016-07-27: qty 1

## 2016-07-27 MED ORDER — IPRATROPIUM-ALBUTEROL 0.5-2.5 (3) MG/3ML IN SOLN
3.0000 mL | RESPIRATORY_TRACT | Status: DC | PRN
  Administered 2016-07-27: 3 mL via RESPIRATORY_TRACT
  Filled 2016-07-27: qty 3

## 2016-07-27 MED ORDER — METOPROLOL TARTRATE 50 MG PO TABS
100.0000 mg | ORAL_TABLET | Freq: Three times a day (TID) | ORAL | Status: DC
  Administered 2016-07-27: 100 mg via ORAL
  Filled 2016-07-27: qty 2

## 2016-07-27 MED ORDER — ACETAMINOPHEN 325 MG PO TABS
650.0000 mg | ORAL_TABLET | Freq: Four times a day (QID) | ORAL | Status: DC | PRN
  Administered 2016-08-02: 650 mg via ORAL
  Filled 2016-07-27: qty 2

## 2016-07-27 MED ORDER — RIVAROXABAN 15 MG PO TABS
15.0000 mg | ORAL_TABLET | Freq: Every day | ORAL | Status: DC
  Administered 2016-07-27 – 2016-07-29 (×3): 15 mg via ORAL
  Filled 2016-07-27 (×4): qty 1

## 2016-07-27 MED ORDER — PANTOPRAZOLE SODIUM 40 MG PO TBEC
40.0000 mg | DELAYED_RELEASE_TABLET | Freq: Every day | ORAL | Status: DC
  Administered 2016-07-27 – 2016-08-03 (×8): 40 mg via ORAL
  Filled 2016-07-27 (×8): qty 1

## 2016-07-27 MED ORDER — INSULIN ASPART 100 UNIT/ML ~~LOC~~ SOLN
0.0000 [IU] | Freq: Every day | SUBCUTANEOUS | Status: DC
  Administered 2016-07-28 – 2016-07-30 (×2): 2 [IU] via SUBCUTANEOUS
  Administered 2016-07-31: 3 [IU] via SUBCUTANEOUS
  Administered 2016-08-01: 4 [IU] via SUBCUTANEOUS
  Filled 2016-07-27: qty 2
  Filled 2016-07-27: qty 3
  Filled 2016-07-27: qty 4
  Filled 2016-07-27: qty 2

## 2016-07-27 MED ORDER — SODIUM BICARBONATE 8.4 % IV SOLN
50.0000 meq | Freq: Once | INTRAVENOUS | Status: AC
  Administered 2016-07-27: 50 meq via INTRAVENOUS
  Filled 2016-07-27 (×2): qty 50

## 2016-07-27 MED ORDER — BUDESONIDE 0.5 MG/2ML IN SUSP
0.5000 mg | Freq: Two times a day (BID) | RESPIRATORY_TRACT | Status: DC
  Administered 2016-07-27 – 2016-08-03 (×15): 0.5 mg via RESPIRATORY_TRACT
  Filled 2016-07-27 (×15): qty 2

## 2016-07-27 MED ORDER — DEXTROSE 5 % IV SOLN
1.0000 g | INTRAVENOUS | Status: DC
  Administered 2016-07-28 – 2016-07-29 (×2): 1 g via INTRAVENOUS
  Filled 2016-07-27 (×4): qty 10

## 2016-07-27 MED ORDER — RIVAROXABAN 20 MG PO TABS: 20.0000 mg | ORAL_TABLET | Freq: Every day | ORAL | Status: DC

## 2016-07-27 MED ORDER — MOMETASONE FURO-FORMOTEROL FUM 200-5 MCG/ACT IN AERO
2.0000 | INHALATION_SPRAY | Freq: Two times a day (BID) | RESPIRATORY_TRACT | Status: DC
Start: 2016-07-27 — End: 2016-07-27
  Administered 2016-07-27: 2 via RESPIRATORY_TRACT
  Filled 2016-07-27: qty 8.8

## 2016-07-27 MED ORDER — FUROSEMIDE 10 MG/ML IJ SOLN
20.0000 mg | Freq: Two times a day (BID) | INTRAMUSCULAR | Status: DC
  Administered 2016-07-27 – 2016-07-31 (×8): 20 mg via INTRAVENOUS
  Filled 2016-07-27 (×8): qty 2

## 2016-07-27 MED ORDER — SODIUM BICARBONATE 8.4 % IV SOLN
50.0000 meq | Freq: Once | INTRAVENOUS | Status: AC
  Administered 2016-07-27: 50 meq via INTRAVENOUS

## 2016-07-27 MED ORDER — IPRATROPIUM-ALBUTEROL 0.5-2.5 (3) MG/3ML IN SOLN
3.0000 mL | RESPIRATORY_TRACT | Status: DC
  Administered 2016-07-27 – 2016-07-28 (×6): 3 mL via RESPIRATORY_TRACT
  Filled 2016-07-27 (×6): qty 3

## 2016-07-27 MED ORDER — ONDANSETRON HCL 4 MG/2ML IJ SOLN
4.0000 mg | Freq: Four times a day (QID) | INTRAMUSCULAR | Status: DC | PRN
  Administered 2016-07-31: 4 mg via INTRAVENOUS
  Filled 2016-07-27: qty 2

## 2016-07-27 MED ORDER — DILTIAZEM HCL ER COATED BEADS 180 MG PO CP24
180.0000 mg | ORAL_CAPSULE | Freq: Every day | ORAL | Status: DC
  Administered 2016-07-27 – 2016-08-03 (×8): 180 mg via ORAL
  Filled 2016-07-27 (×8): qty 1

## 2016-07-27 MED ORDER — GLUCERNA SHAKE PO LIQD
237.0000 mL | Freq: Three times a day (TID) | ORAL | Status: DC
  Administered 2016-07-27 – 2016-08-03 (×17): 237 mL via ORAL

## 2016-07-27 MED ORDER — FUROSEMIDE 10 MG/ML IJ SOLN
40.0000 mg | Freq: Once | INTRAMUSCULAR | Status: AC
  Administered 2016-07-27: 40 mg via INTRAVENOUS
  Filled 2016-07-27: qty 4

## 2016-07-27 MED ORDER — DEXTROSE 5 % IV SOLN
1.0000 g | Freq: Once | INTRAVENOUS | Status: AC
  Administered 2016-07-27: 1 g via INTRAVENOUS
  Filled 2016-07-27: qty 10

## 2016-07-27 MED ORDER — METHYLPREDNISOLONE SODIUM SUCC 125 MG IJ SOLR
60.0000 mg | Freq: Four times a day (QID) | INTRAMUSCULAR | Status: DC
  Administered 2016-07-27 (×2): 60 mg via INTRAVENOUS
  Filled 2016-07-27 (×2): qty 2

## 2016-07-27 MED ORDER — METOPROLOL TARTRATE 100 MG PO TABS
100.0000 mg | ORAL_TABLET | Freq: Two times a day (BID) | ORAL | Status: DC
  Administered 2016-07-28 – 2016-08-03 (×13): 100 mg via ORAL
  Filled 2016-07-27 (×6): qty 1
  Filled 2016-07-27: qty 2
  Filled 2016-07-27 (×6): qty 1

## 2016-07-27 MED ORDER — ASPIRIN 81 MG PO CHEW
81.0000 mg | CHEWABLE_TABLET | Freq: Every day | ORAL | Status: DC
  Administered 2016-07-27 – 2016-08-03 (×8): 81 mg via ORAL
  Filled 2016-07-27 (×8): qty 1

## 2016-07-27 MED ORDER — NITROGLYCERIN 2 % TD OINT
1.0000 [in_us] | TOPICAL_OINTMENT | Freq: Four times a day (QID) | TRANSDERMAL | Status: DC
  Administered 2016-07-27 – 2016-08-02 (×23): 1 [in_us] via TOPICAL
  Filled 2016-07-27 (×22): qty 1

## 2016-07-27 MED ORDER — INSULIN ASPART 100 UNIT/ML ~~LOC~~ SOLN
0.0000 [IU] | Freq: Three times a day (TID) | SUBCUTANEOUS | Status: DC
  Administered 2016-07-27: 7 [IU] via SUBCUTANEOUS
  Administered 2016-07-27: 9 [IU] via SUBCUTANEOUS
  Administered 2016-07-27: 1 [IU] via SUBCUTANEOUS
  Administered 2016-07-28: 7 [IU] via SUBCUTANEOUS
  Administered 2016-07-28: 2 [IU] via SUBCUTANEOUS
  Administered 2016-07-28 – 2016-07-29 (×2): 3 [IU] via SUBCUTANEOUS
  Filled 2016-07-27: qty 9
  Filled 2016-07-27: qty 3
  Filled 2016-07-27: qty 7
  Filled 2016-07-27: qty 1
  Filled 2016-07-27: qty 7
  Filled 2016-07-27: qty 2
  Filled 2016-07-27: qty 3

## 2016-07-27 NOTE — Progress Notes (Signed)
eLink Physician-Brief Progress Note Patient Name: Tamaki Wilking DOB: 1941/02/18 MRN: [id number]   Date of Service  07/27/2016  HPI/Events of Note  69 female admitted with significant shortness of breath. Patient has known history of severe CHF, and has been admitted in the past with similar symptoms. However, she states that tonight symptoms are much worse. She has a difficult time speaking in complete sentences. Workup in the ED is indicative of CHF, however also potentially some COPD. She does not have a diagnosis of COPD on the chart, but she does use Dulera at home. She was treated with IV Lasix, as well as COPD exacerbation treatment.  Currently the paitent is HD stable a little tachy at 113 but sats of 96% on BiPAP and RR of 19.  She appears via camera check to be in no acute distress.  eICU Interventions  Plan of care per primary admitting team Continue to monitor via [facility]     Intervention Category Evaluation Type: New Patient Evaluation  Karyss Frese,Diani 07/27/2016, 3:47 AM

## 2016-07-27 NOTE — Progress Notes (Signed)
Pt. Was placed back on bipap for increased WOB.

## 2016-07-27 NOTE — Progress Notes (Signed)
Pt refused to let me place on cpap machine. Pt pulled mask away before I could strap it on. She stated she was sick to her stomach and could not handle the air. 3 attempts to place mask and she refused.

## 2016-07-27 NOTE — Discharge Instructions (Signed)
Heart Failure Clinic appointment on Aug 08, 2016 at 11:00am with Clarisa Kindred, FNP. Please call [phone] to reschedule.

## 2016-07-27 NOTE — Progress Notes (Signed)
Inpatient Diabetes Program Recommendations  AACE/ADA: New Consensus Statement on Inpatient Glycemic Control (2015)  Target Ranges:  Prepandial:   less than 140 mg/dL      Peak postprandial:   less than 180 mg/dL (1-2 hours)      Critically ill patients:  140 - 180 mg/dL   Results for Robin [name], Robin [name] (MRN [id number]) as of 07/27/2016 10:38  Ref. Range 07/27/2016 03:15 07/27/2016 08:10  Glucose-Capillary Latest Ref Range: 65 - 99 mg/dL 081 (H) 448 (H)  Results for Robin [name], Robin [name] (MRN [id number]) as of 07/27/2016 10:38  Ref. Range 07/26/2016 20:56 07/27/2016 03:41  Glucose Latest Ref Range: 65 - 99 mg/dL 026 (H) 378 (H)   Review of Glycemic Control  Diabetes history:DM2 Outpatient Diabetes medications: Glipizide XL 2.5 mg QAM Current orders for Inpatient glycemic control: Novolog 0-9 units TID with meals, Novolog 0-5 units QHS  Inpatient Diabetes Program Recommendations:  Insulin - Basal: If steroids are continued, please consider ordering Lantus 10 units Q24H starting now. Please note that if Lantus is ordered as requested, it will likely need to be adjusted as steroids are tapered. Insulin - Meal Coverage: While inpatient and ordered steroids, if post prandial glucose remains elevated please consider ordering Novolog 3 units TID with meals for meal coverage if patient eats at least 50% of meals.  Thanks, Orlando Penner, RN, MSN, CDE Diabetes Coordinator Inpatient Diabetes Program [phone] (Team Pager from 8am to 5pm)

## 2016-07-27 NOTE — Care Management Note (Addendum)
Case Management Note  Patient Details  Name: Robin [name] MRN: [id number] Date of Birth: [birth year]  Subjective/Objective:                  Met with patient and her female friend [name] [phone] . Patient stays between daughter's address in [place] and son's address in [place]. She is open to [place] for [place], Athalia, HHPT. Orders will need to be faxed for resumption of care to [facility] at  Fax [phone].Patient ambulates in home with rollator. Patient requests Rx for glucometer. O2 is acute.   Action/Plan: [facility] aware of patient admission to this hospital and will await notification from [facility] on patient's discharge. Please provide patient with Rx for glucometer prior to discharge. Patient aware that most pharmacies (including [facility]) can provide glucometer.   Expected Discharge Date:                  Expected Discharge Plan:     In-House Referral:     Discharge planning Services  CM Consult  Post Acute Care Choice:    Choice offered to:  Patient  DME Arranged:    DME Agency:     HH Arranged:  RN, PT, NA [place] Agency:     Status of Service:  In process, will continue to follow  If discussed at Long Length of Stay Meetings, dates discussed:    Additional Comments:  [name], RN 07/27/2016, 12:55 PM

## 2016-07-27 NOTE — Progress Notes (Signed)
Pt arrived from floor, with dyspnea at rest, calling out for water, but not answering other questions, and is very lethargic, Bipap placed and pt tolerated it . Clarified order for IV bicarb as crackles heard bilaterally. IV fluid decreased from 100 mls to 50 mls. IV lasix and solumedrol given. NP in to see

## 2016-07-27 NOTE — Progress Notes (Signed)
Name: Robin [name] MRN: [id number] DOB: 08-26-1940    ADMISSION DATE:  07/26/2016  CHIEF COMPLAINT: Shortness of breath  BRIEF PATIENT DESCRIPTION: 76 year old female with severe shortness of breath secondary to CHF exacerbation.  SIGNIFICANT EVENTS  5/17 Patient presented to [facility] with severe shortness of breath secondary to CHF exacerbation. 5/17 Patient transferred to the ICU due to shortness of breath requiring BiPAP.  STUDIES:  05/02/16 echo>>The cavity size was moderately dilated. Systolic  function was severely reduced. The estimated ejection fraction  was in the range of 25% to 30%   HISTORY OF PRESENT ILLNESS: Robin [name] is a 76 year old female with known history of Afib, CHF,Asthma,CAD,DM, Hypertension and Hyperlipidemia.Patient has known history of CHF with Low EF.  She has been admitted in the past with similar symptoms.  However she states that tonight her symptoms are much worse.  She has a hard time in speaking in complete sentences. She was worked up for CHF exacerbation. Her BNP- 2498.  Patient received lasix X1, Methylprednisone X1. Patient was placed on BiPAP and sent to the ICU for further monitoring.  PAST MEDICAL HISTORY :   has a past medical history of Afib (HCC); Anxiety; Asthma; CHF (congestive heart failure) (HCC); Coronary artery disease; Diabetes mellitus without complication (HCC); Hyperlipidemia; and Hypertension.  has a past surgical history that includes No past surgeries; Coronary artery bypass graft; and Cardiac catheterization (Left, 03/02/2016). Prior to Admission medications   Medication Sig Start Date End Date Taking? Authorizing Provider  albuterol (ACCUNEB) 1.25 MG/3ML nebulizer solution Take 1.25 mg by nebulization every 4 (four) hours as needed for wheezing or shortness of breath.     [provider]  aspirin 81 MG chewable tablet Chew 81 mg by mouth daily. 04/13/16   [provider]  atorvastatin (LIPITOR) 80 MG tablet Take  1 tablet (80 mg total) by mouth daily at 6 PM. 03/05/16   Enedina Finner, MD  diltiazem (CARDIZEM CD) 180 MG 24 hr capsule Take 1 capsule (180 mg total) by mouth daily. 03/05/16   Enedina Finner, MD  furosemide (LASIX) 20 MG tablet Take 20 mg by mouth every morning.    [provider]  glipiZIDE (GLUCOTROL) 5 MG tablet Take 0.5 tablets (2.5 mg total) by mouth daily before breakfast. 03/06/16   Enedina Finner, MD  LORazepam (ATIVAN) 1 MG tablet Take 1 tablet (1 mg total) by mouth 2 (two) times daily. 05/05/16 05/05/17  Emily Filbert, MD  meclizine (ANTIVERT) 25 MG tablet Take 1 tablet (25 mg total) by mouth 3 (three) times daily as needed for dizziness. 11/06/15   Enid Baas, MD  metoprolol (LOPRESSOR) 100 MG tablet Take 1 tablet (100 mg total) by mouth 3 (three) times daily. 06/09/16   Gouru, Deanna Artis, MD  mometasone-formoterol (DULERA) 200-5 MCG/ACT AERO Inhale 2 puffs into the lungs 2 (two) times daily. 03/05/16   Enedina Finner, MD  nitroGLYCERIN (NITROSTAT) 0.4 MG SL tablet Place 1 tablet (0.4 mg total) under the tongue every 5 (five) minutes as needed for chest pain. 01/25/16   Adrian Saran, MD  ondansetron (ZOFRAN ODT) 4 MG disintegrating tablet Allow 1-2 tablets to dissolve in your mouth every 8 hours as needed for nausea/vomiting 06/14/16   Loleta Rose, MD  oxyCODONE-acetaminophen (PERCOCET/ROXICET) 5-325 MG tablet Take 1 tablet by mouth every 4 (four) hours as needed for moderate pain or severe pain. 03/05/16   Enedina Finner, MD  pantoprazole (PROTONIX) 40 MG tablet Take 1 tablet (40 mg total)  by mouth daily. Patient taking differently: Take 40 mg by mouth daily as needed. For acid reflux 11/06/15   Enid Baas, MD  rivaroxaban (XARELTO) 20 MG TABS tablet Take 1 tablet (20 mg total) by mouth daily with supper. 06/13/16   Emily Filbert, MD   Allergies  Allergen Reactions  . Morphine And Related Other (See Comments)    Patient states it makes her "space out"    FAMILY  HISTORY:  family history includes Heart disease in her father; Heart failure in her mother; Hypertension in her mother. SOCIAL HISTORY:  reports that she has never smoked. She has never used smokeless tobacco. She reports that she does not drink alcohol or use drugs.  REVIEW OF SYSTEMS:   Constitutional: Negative for fever, chills, weight loss, malaise/fatigue and diaphoresis.  HENT: Negative for hearing loss, ear pain, nosebleeds, congestion, sore throat, neck pain, tinnitus and ear discharge.   Eyes: Negative for blurred vision, double vision, photophobia, pain, discharge and redness.  Respiratory: Negative for cough, hemoptysis, sputum production, shortness of breath, wheezing and stridor.   Cardiovascular: Negative for chest pain, palpitations, orthopnea, claudication, leg swelling and PND.  Gastrointestinal: Negative for heartburn, nausea, vomiting, abdominal pain, diarrhea, constipation, blood in stool and melena.  Genitourinary: Negative for dysuria, urgency, frequency, hematuria and flank pain.  Musculoskeletal: Negative for myalgias, back pain, joint pain and falls.  Skin: Negative for itching and rash.  Neurological: Negative for dizziness, tingling, tremors, sensory change, speech change, focal weakness, seizures, loss of consciousness, weakness and headaches.  Endo/Heme/Allergies: Negative for environmental allergies and polydipsia. Does not bruise/bleed easily.  SUBJECTIVE: Patient is unable to speak as she is on BiPAP  VITAL SIGNS: Temp:  [97.9 F (36.6 C)-98 F (36.7 C)] 98 F (36.7 C) (05/18 0045) Pulse Rate:  [113-121] 113 (05/18 0300) Resp:  [16-20] 20 (05/18 0000) BP: (146-197)/(69-115) 170/84 (05/18 0300) SpO2:  [91 %-98 %] 93 % (05/18 0045) Weight:  [144 lb 3.2 oz (65.4 kg)-146 lb (66.2 kg)] 144 lb 3.2 oz (65.4 kg) (05/18 0045)  PHYSICAL EXAMINATION: General:  Elderly female,On Bipap, in moderate distress Neuro:  Awake, Alert and Oriented HEENT:  AT,  [place],JVDnoted Cardiovascular:  S1s2,regular, no m/r/g  Lungs: diminished , no wheezes, crackles,rhonchi Abdomen:  Soft,NT,ND,+BS Musculoskeletal:  No edema, cyanosis Skin:  Warm,dry and Intact   Recent Labs Lab 07/26/16 2056  NA 139  K 3.6  CL 105  CO2 23  BUN 18  CREATININE 1.16*  GLUCOSE 178*    Recent Labs Lab 07/26/16 2056 07/27/16 0341  HGB 10.0* 9.8*  HCT 31.8* 31.9*  WBC 12.6* 19.9*  PLT 316 321   Dg Chest 2 View  Result Date: 07/26/2016 CLINICAL DATA:  Chest pain and shortness of breath beginning this morning. Wheezing. EXAM: CHEST  2 VIEW COMPARISON:  06/12/2016 FINDINGS: Cardiomegaly remains stable. Diffuse interstitial infiltrates have increased since previous study, likely due to pulmonary edema. Tiny bilateral pleural effusions seen. No evidence of pulmonary consolidation. Prior CABG again noted IMPRESSION: Mild congestive heart failure and tiny bilateral pleural effusions. Electronically Signed   By: Myles Rosenthal M.D.   On: 07/26/2016 20:28    ASSESSMENT / PLAN: Acute on Chronic Respiratory failure secondary to CHF exacerbation Metabolic Acidosis Atrial Fibrillation Asthma Hypertension Hyperlipidemia CAD Diabetes Melitus Elevated troponin possibly related to Demand Ischemia Leukocytosis  Plan Continue BiPAP, wean as tolerated Cardiology consulted NaHCO3 2 amps x1 F/uABG ECHO pending Bronchodilators Continue Aspirin Continue Atorvastatin Continue Rivaroxaban Continue Metoprolol/diltiazem Blood sugar checks with  SSI coverage. Continue Steroids, wean    Bincy Varughese,AG-ACNP Pulmonary and Critical Care Medicine [facility]   07/27/2016, 4:18 AM

## 2016-07-27 NOTE — H&P (Signed)
Patient's daughter Robin [name] called and notified of pt's request to be contacted, daughter states she will try to visit pt after work. Daughter updated that pt was now on bipap and had been transferred to ICU for worsening SOB. Room number and telephone number given to family member, also message left for son.

## 2016-07-27 NOTE — Progress Notes (Addendum)
[facility] - [place] at [facility]   PATIENT NAME: Robin [name]    MR#:  [id number]  DATE OF BIRTH:  01-06-1941  SUBJECTIVE:  CHIEF COMPLAINT:   Chief Complaint  Patient presents with  . 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 Her chest x-ray revealed mild congestive heart failure and bilateral pleural effusions. Physical exam was concerning for COPD exacerbation. Patient was given Lasix, nebulizer therapy and improved clinically, now weaned off BiPAP and is on oxygen therapy per nasal cannulas. She feels relatively stable, denies significant shortness of breath or chest pains. Review of Systems  Constitutional: Negative for chills, fever and weight loss.  HENT: Negative for congestion.   Eyes: Negative for blurred vision and double vision.  Respiratory: Positive for cough, shortness of breath and wheezing. Negative for sputum production.   Cardiovascular: Negative for chest pain, palpitations, orthopnea, leg swelling and PND.  Gastrointestinal: Negative for abdominal pain, blood in stool, constipation, diarrhea, nausea and vomiting.  Genitourinary: Negative for dysuria, frequency, hematuria and urgency.  Musculoskeletal: Negative for falls.  Neurological: Negative for dizziness, tremors, focal weakness and headaches.  Endo/Heme/Allergies: Does not bruise/bleed easily.  Psychiatric/Behavioral: Negative for depression. The patient does not have insomnia.     VITAL SIGNS: Blood pressure (!) 160/71, pulse 98,  temperature 97.9 F (36.6 C), temperature source Oral, resp. rate 20, height 5\' 4"  (1.626 m), weight 65.4 kg (144 lb 3.2 oz), SpO2 96 %.  PHYSICAL EXAMINATION:   GENERAL:  76 y.o.-year-old patient lying in the bed in mild to moderate respiratory distress, intermittently tachypneic.  EYES: Pupils equal, round, reactive to light and accommodation. No scleral icterus. Extraocular muscles intact.  HEENT: Head atraumatic, normocephalic. Oropharynx and nasopharynx clear.  NECK:  Supple, no jugular venous distention. No thyroid enlargement, no tenderness.  LUNGS: Diminished breath sounds bilaterally, scattered diffuse wheezing, few bilateral rales,rhonchi , crepitations at bases, especially on the left. Intermittent use of accessory muscles of respiration.  CARDIOVASCULAR: S1, S2 . Rhythm was regular. 2/6 systolic murmur precordially, no rubs, or gallops.  ABDOMEN: Soft, nontender, nondistended. Bowel sounds present. No organomegaly or mass.  EXTREMITIES: 2+ lower extremity and pedal edema bilaterally, no cyanosis, or clubbing.  NEUROLOGIC: Cranial nerves II through XII are intact. Muscle strength 5/5 in all extremities. Sensation intact. Gait not checked.  PSYCHIATRIC: The patient is alert and oriented x 3.  SKIN: No obvious rash, lesion, or ulcer.   ORDERS/RESULTS REVIEWED:   CBC  Recent Labs Lab 07/26/16 2056 07/27/16 0341  WBC 12.6* 19.9*  HGB 10.0* 9.8*  HCT 31.8* 31.9*  PLT 316 321  MCV 74.9* 75.7*  MCH 23.5* 23.2*  MCHC 31.4* 30.6*  RDW 19.5* 20.0*   ------------------------------------------------------------------------------------------------------------------  Chemistries   Recent Labs Lab 07/26/16 2056 07/27/16 0341  NA 139 139  K 3.6 4.2  CL 105 105  CO2 23 22  GLUCOSE 178* 366*  BUN 18 19  CREATININE 1.16* 1.32*  CALCIUM 9.1 8.8*   ------------------------------------------------------------------------------------------------------------------ estimated  creatinine clearance is 31.8 mL/min (A) (by C-G formula based on SCr of 1.32 mg/dL (H)). ------------------------------------------------------------------------------------------------------------------ No results  for input(s): TSH, T4TOTAL, T3FREE, THYROIDAB in the last 72 hours.  Invalid input(s): FREET3  Cardiac Enzymes  Recent Labs Lab 07/26/16 2056 07/27/16 0341 07/27/16 0924  TROPONINI 0.05* 0.07* 0.43*   ------------------------------------------------------------------------------------------------------------------ Invalid input(s): POCBNP ---------------------------------------------------------------------------------------------------------------  RADIOLOGY: Dg Chest 2 View  Result Date: 07/26/2016 CLINICAL DATA:  Chest pain and shortness of breath beginning this morning. Wheezing. EXAM: CHEST  2 VIEW COMPARISON:  06/12/2016 FINDINGS: Cardiomegaly remains stable. Diffuse interstitial infiltrates have increased since previous study, likely due to pulmonary edema. Tiny bilateral pleural effusions seen. No evidence of pulmonary consolidation. Prior CABG again noted IMPRESSION: Mild congestive heart failure and tiny bilateral pleural effusions. Electronically Signed   By: Myles Rosenthal M.D.   On: 07/26/2016 20:28    EKG:  Orders placed or performed during the hospital encounter of 07/26/16  . ED EKG within 10 minutes  . ED EKG within 10 minutes    ASSESSMENT AND PLAN:  Principal Problem:   Acute on chronic systolic CHF (congestive heart failure) (HCC) Active Problems:   HTN (hypertension)   HLD (hyperlipidemia)   CAD (coronary artery disease)   Diabetes (HCC)  #1. Acute respiratory failure with hypoxia and mild hypercapnia, now off BiPAP, continue oxygen therapy, wean off oxygen as tolerated. Patient is not on oxygen at home #2. Acute on chronic systolic CHF, continue diuretics, follow ins and outs, weight, oxygenation #3. COPD exacerbation due to acute bronchitis,  continue patient on nebulizing therapy, inhalers, follow clinically #4. History of atrial fibrillation, now in sinus rhythm, continue patient on Xarelto, heart rate is well controlled #5. CK D stage III, stable, followed with diuresis #6. Elevated troponin, likely demand ischemia, no significant chest pains at present, follow closely, continue patient on aspirin, Lipitor, Cardizem, cardiologist consultation is requested #7. Acute bronchitis, continue Zithromax, patient's pro-calcitonin level is elevated #8. Leukocytosis, likely due to steroids, follow closely  Management plans discussed with the patient, family and they are in agreement.   DRUG ALLERGIES:  Allergies  Allergen Reactions  . Morphine And Related Other (See Comments)    Patient states it makes her "space out"    CODE STATUS:     Code Status Orders        Start     Ordered   07/27/16 0021  Full code  Continuous     07/27/16 0020    Code Status History    Date Active Date Inactive Code Status Order ID Comments User Context   06/07/2016  9:35 PM 06/09/2016  5:15 PM Full Code [id number]  Auburn Bilberry, MD ED   05/02/2016  4:56 PM 05/03/2016  8:01 PM Full Code [id number]  Houston Siren, MD Inpatient   03/02/2016  3:34 PM 03/05/2016  4:16 PM Full Code [id number]  Schnier, Latina Craver, MD Inpatient   02/28/2016  2:43 AM 03/02/2016  3:33 PM Full Code [id number]  Oralia Manis, MD Inpatient   02/06/2016  7:17 PM 02/10/2016 11:14 PM Full Code [id number]  Hower, Cletis Athens, MD ED   01/24/2016  2:00 PM 01/25/2016  7:15 PM Full Code [id number]  Auburn Bilberry, MD Inpatient   11/25/2015  3:40 PM 11/26/2015  3:49 PM Full Code [id number]  Houston Siren, MD Inpatient   11/05/2015 12:33 AM 11/06/2015  6:05 PM Full Code [id number]  Oralia Manis, MD Inpatient   09/13/2015 10:10 AM 09/15/2015  7:33 PM Full Code [id number]  Altamese Dilling, MD Inpatient      TOTAL  TIME TAKING CARE OF THIS PATIENT: 35 minutes.  Katharina Caper M.D on  07/27/2016 at 1:12 PM  Between 7am to 6pm - Pager - [phone]  After 6pm go to [email or web address] - password [email or web address] [facility]  [place] [facility]  Office  [phone]  CC: Primary care physician; Leanna Sato, MD

## 2016-07-27 NOTE — ED Notes (Signed)
Report to Koleen Nimrod, RN at this time. Encouraged to call with further questions.

## 2016-07-27 NOTE — Progress Notes (Signed)
Initial Nutrition Assessment  DOCUMENTATION CODES:   Not applicable  INTERVENTION:  1. Glucerna Shake po TID, each supplement provides 220 kcal and 10 grams of protein 2. Provide Ensure as blood sugar stabilizes  NUTRITION DIAGNOSIS:   Inadequate oral intake related to poor appetite as evidenced by per patient/family report.  GOAL:   Patient will meet greater than or equal to 90% of their needs  MONITOR:   PO intake, I & O's, Labs, Weight trends, Supplement acceptance  REASON FOR ASSESSMENT:   Malnutrition Screening Tool    ASSESSMENT:   Robin [name]  is a 76 y.o. female who presents with Significant shortness of breath. Patient has known history of severe CHF, and has been admitted in the past with similar symptoms. However, she states that tonight symptoms are much worse  Spoke with patient, significant other at bedside. She was able to provide some history, some also provided by him. Seems her weight has been trending upwards likely related to fluid accumulation. Deny weight loss at this time. Appetite seems to vary - yesterday she consumed 1/2 a bagel with cream cheese and pinto beans and rice with chicken. They were unable to provide intake on a normal day. Was nauseated yesterday evening but otherwise denies any nausea/vomiting/diarrhea/constipation PTA. No issues chewing/swallowing/choking Moves back and forth between BiPAP and Nasal Cannula.  Nutrition-Focused physical exam completed. Findings are no fat depletion, no muscle depletion, and moderate edema.   Labs and medications reviewed: CBGs 331-371 Solumedrol  Diet Order:  Diet heart healthy/carb modified Room service appropriate? Yes; Fluid consistency: Thin  Skin:  Reviewed, no issues  Last BM:  07/26/2016  Height:   Ht Readings from Last 1 Encounters:  07/26/16 5\' 4"  (1.626 m)    Weight:   Wt Readings from Last 1 Encounters:  07/27/16 144 lb 3.2 oz (65.4 kg)    Ideal Body Weight:  54.54  kg  BMI:  Body mass index is 24.75 kg/m.  Estimated Nutritional Needs:   Kcal:  1400-1700 calories  Protein:  65-78 gm  Fluid:  >/= 1.4L  EDUCATION NEEDS:   Education needs no appropriate at this time  Dionne Ano. Kerith Sherley, MS, RD LDN Inpatient Clinical Dietitian Pager [phone]

## 2016-07-27 NOTE — Progress Notes (Signed)
More awake and alert this am, Bicarb replaced vis bolus push , No IV fluids at this time, 2 lines saline locked. Pt has voided approx 700 mls of urine, lungs clearer, still on Bipap at this time

## 2016-07-27 NOTE — Consult Note (Signed)
[facility] Card[facility] Consultation Note  Patient ID: Robin [name], MRN: [id number], DOB/AGE: 14-Oct-1940 75 y.o. Admit date: 07/26/2016   Date of Consult: 07/27/2016 Primary Physician: Leanna Sato, MD Primary Cardiologist:Fath  Chief Complaint:  Chief Complaint  Patient presents with  . Shortness of Breath   Reason for Consult: acute on chronic systolic dysfunction heart failure  HPI: 76 y.o. female with known severe LV systolic dysfunction Cox's Mall nonvalvular atrial fibrillation essential hypertension makes hyperlipidemia and coronary artery disease status post previous coronary artery bypass graft as well as diabetes with complication having acute on chronic systolic dysfunction heart failure. The patient has had severe progression of shortness of breath hypoxia weakness and fatigue area and some lower extremity edema over the last several days for which she has responded somewhat to BiPAP as well as intravenous furosemide. He does appear that she also has some bronchitis and possible COPD exacerbation which is now being helped with the antibiotics. Previously she has been on diltiazem for atrial fibrillation currently maintaining normal sinus rhythm. This previous echocardiogram has shown normal LV systolic dysfunction with ejection fraction of 25%. Current BNP is 2500 and the patient has a troponin of 1.01 consistent with demand ischemia rather than acute coronary syndrome. Currently she is on BiPAP and hemodynamically stable with no evidence of chest discomfort  Past Medical History:  Diagnosis Date  . Afib (HCC)   . Anxiety   . Asthma   . CHF (congestive heart failure) (HCC)   . Coronary artery disease   . Diabetes mellitus without complication (HCC)   . Hyperlipidemia   . Hypertension       Surgical History:  Past Surgical History:  Procedure Laterality Date  . CORONARY ARTERY BYPASS GRAFT    . NO PAST SURGERIES    . PERIPHERAL VASCULAR CATHETERIZATION Left  03/02/2016   Procedure: Renal Intervention;  Surgeon: Renford Dills, MD;  Location: [facility] INVASIVE CV LAB;  Service: Cardiovascular;  Laterality: Left;     Home Meds: Prior to Admission medications   Medication Sig Start Date End Date Taking? Authorizing Provider  albuterol (ACCUNEB) 1.25 MG/3ML nebulizer solution Take 1.25 mg by nebulization every 4 (four) hours as needed for wheezing or shortness of breath.    Yes [provider]  amiodarone (PACERONE) 400 MG tablet Take 400 mg by mouth daily. 07/20/16  Yes [provider]  apixaban (ELIQUIS) 5 MG TABS tablet Take 5 mg by mouth 2 (two) times daily. 07/04/16 07/03/17 Yes [provider]  aspirin 81 MG chewable tablet Chew 81 mg by mouth daily. 04/13/16  Yes [provider]  famotidine (PEPCID) 20 MG tablet Take 20 mg by mouth 2 (two) times daily as needed. 07/03/16 08/02/16 Yes [provider]  hydrALAZINE (APRESOLINE) 25 MG tablet Take 75 mg by mouth 3 (three) times daily.   Yes [provider]  lisinopril (PRINIVIL,ZESTRIL) 40 MG tablet Take 40 mg by mouth daily. 07/03/16 08/02/16 Yes [provider]  LORazepam (ATIVAN) 1 MG tablet Take 1 tablet (1 mg total) by mouth 2 (two) times daily. 05/05/16 05/05/17 Yes Emily Filbert, MD  meclizine (ANTIVERT) 25 MG tablet Take 1 tablet (25 mg total) by mouth 3 (three) times daily as needed for dizziness. 11/06/15  Yes Enid Baas, MD  metoprolol succinate (TOPROL-XL) 100 MG 24 hr tablet Take 100 mg by mouth daily.   Yes [provider]  nitroGLYCERIN (NITROSTAT) 0.4 MG SL tablet Place 1 tablet (0.4 mg total) under the  tongue every 5 (five) minutes as needed for chest pain. 01/25/16  Yes Adrian Saran, MD  ondansetron (ZOFRAN ODT) 4 MG disintegrating tablet Allow 1-2 tablets to dissolve in your mouth every 8 hours as needed for nausea/vomiting 06/14/16  Yes Loleta Rose, MD  spironolactone (ALDACTONE) 25 MG tablet Take 0.5 tablets by  mouth daily. 07/20/16  Yes [provider]  traMADol (ULTRAM) 50 MG tablet Take 50 mg by mouth every 6 (six) hours as needed.   Yes [provider]  atorvastatin (LIPITOR) 80 MG tablet Take 1 tablet (80 mg total) by mouth daily at 6 PM. Patient not taking: Reported on 07/27/2016 03/05/16   Enedina Finner, MD  diltiazem (CARDIZEM CD) 180 MG 24 hr capsule Take 1 capsule (180 mg total) by mouth daily. Patient not taking: Reported on 07/27/2016 03/05/16   Enedina Finner, MD  furosemide (LASIX) 20 MG tablet Take 20 mg by mouth every morning.    [provider]  glipiZIDE (GLUCOTROL) 5 MG tablet Take 0.5 tablets (2.5 mg total) by mouth daily before breakfast. Patient not taking: Reported on 07/27/2016 03/06/16   Enedina Finner, MD  mometasone-formoterol [facility]) 200-5 MCG/ACT AERO Inhale 2 puffs into the lungs 2 (two) times daily. Patient not taking: Reported on 07/27/2016 03/05/16   Enedina Finner, MD  oxyCODONE-acetaminophen (PERCOCET/ROXICET) 5-325 MG tablet Take 1 tablet by mouth every 4 (four) hours as needed for moderate pain or severe pain. Patient not taking: Reported on 07/27/2016 03/05/16   Enedina Finner, MD  pantoprazole (PROTONIX) 40 MG tablet Take 1 tablet (40 mg total) by mouth daily. Patient not taking: Reported on 07/27/2016 11/06/15   Enid Baas, MD  rivaroxaban (XARELTO) 20 MG TABS tablet Take 1 tablet (20 mg total) by mouth daily with supper. Patient not taking: Reported on 07/27/2016 06/13/16   Emily Filbert, MD    Inpatient Medications:  . aspirin  81 mg Oral Daily  . atorvastatin  80 mg Oral q1800  . azithromycin  500 mg Oral Daily  . budesonide (PULMICORT) nebulizer solution  0.5 mg Nebulization BID  . diltiazem  180 mg Oral Daily  . feeding supplement (GLUCERNA SHAKE)  237 mL Oral TID BM  . furosemide  20 mg Intravenous BID  . insulin aspart  0-5 Units Subcutaneous QHS  . insulin aspart  0-9 Units Subcutaneous TID WC  . ipratropium-albuterol  3 mL  Nebulization Q4H  . LORazepam  1 mg Oral BID  . methylPREDNISolone (SOLU-MEDROL) injection  40 mg Intravenous Q12H  . metoprolol tartrate  100 mg Oral BID  . nitroGLYCERIN  1 inch Topical Q6H  . pantoprazole  40 mg Oral Daily  . rivaroxaban  15 mg Oral Q supper   . [START ON 07/28/2016] cefTRIAXone (ROCEPHIN)  IV      Allergies:  Allergies  Allergen Reactions  . Morphine And Related Other (See Comments)    Patient states it makes her "space out"    Social History   Social History  . Marital status: Divorced    Spouse name: N/A  . Number of children: N/A  . Years of education: N/A   Occupational History  . Not on file.   Social History Main Topics  . Smoking status: Never Smoker  . Smokeless tobacco: Never Used  . Alcohol use No  . Drug use: No  . Sexual activity: Not on file   Other Topics Concern  . Not on file   Social History Narrative  . No narrative on file  Family History  Problem Relation Age of Onset  . Hypertension Mother   . Heart failure Mother   . Heart disease Father      Review of Systems Positive forShortness of breath PND orthopnea cough congestion Negative for: General:  chills, fever, night sweats or weight changes.  Cardiovascular: Positive for PND orthopnea negative for syncope dizziness  Dermatological skin lesions rashes Respiratory: Positive for Cough congestion Urologic: Frequent urination urination at night and hematuria Abdominal: negative for nausea, vomiting, diarrhea, bright red blood per rectum, melena, or hematemesis Neurologic: negative for visual changes, and/or hearing changes  All other systems reviewed and are otherwise negative except as noted above.  Labs:  Recent Labs  07/26/16 2056 07/27/16 0341 07/27/16 0924 07/27/16 1511  TROPONINI 0.05* 0.07* 0.43* 1.01*   Lab Results  Component Value Date   WBC 19.9 (H) 07/27/2016   HGB 9.8 (L) 07/27/2016   HCT 31.9 (L) 07/27/2016   MCV 75.7 (L) 07/27/2016    PLT 321 07/27/2016    Recent Labs Lab 07/27/16 0341  NA 139  K 4.2  CL 105  CO2 22  BUN 19  CREATININE 1.32*  CALCIUM 8.8*  GLUCOSE 366*   Lab Results  Component Value Date   CHOL 229 (H) 02/11/2014   HDL 49 02/11/2014   LDLCALC 156 (H) 02/11/2014   TRIG 120 02/11/2014   No results found for: DDIMER  Radiology/Studies:  Dg Chest 2 View  Result Date: 07/26/2016 CLINICAL DATA:  Chest pain and shortness of breath beginning this morning. Wheezing. EXAM: CHEST  2 VIEW COMPARISON:  06/12/2016 FINDINGS: Cardiomegaly remains stable. Diffuse interstitial infiltrates have increased since previous study, likely due to pulmonary edema. Tiny bilateral pleural effusions seen. No evidence of pulmonary consolidation. Prior CABG again noted IMPRESSION: Mild congestive heart failure and tiny bilateral pleural effusions. Electronically Signed   By: Myles Rosenthal M.D.   On: 07/26/2016 20:28    TKZ:SWFUXN sinus rhythm with nonspecific ST and T-wave changes  Weights: Filed Weights   07/26/16 1954 07/27/16 0045  Weight: 66.2 kg (146 lb) 65.4 kg (144 lb 3.2 oz)     Physical Exam: Blood pressure (!) 160/71, pulse 98, temperature 97.8 F (36.6 C), temperature source Oral, resp. rate 20, height 5\' 4"  (1.626 m), weight 65.4 kg (144 lb 3.2 oz), SpO2 96 %. Body mass index is 24.75 kg/m. General: Well developed, well nourished, in no acute distress. Head eyes ears nose throat: Normocephalic, atraumatic, sclera non-icteric, no xanthomas, nares are without discharge. No apparent thyromegaly and/or mass  Lungs: Normal respiratory effort.Few wheezes,Basilar rales, no rhonchi.  Heart: RRR with normal S1 S2. no murmur gallop, no rub, PMI is normal size and placement, carotid upstroke normal without bruit, jugular venous pressure is normal Abdomen: Soft, non-tender, non-distended with normoactive bowel sounds. No hepatomegaly. No rebound/guarding. No obvious abdominal masses. Abdominal aorta is normal size  without bruit Extremities: Trace edema. no cyanosis, no clubbing, no ulcers  Peripheral : 2+ bilateral upper extremity pulses, 2+ bilateral femoral pulses, 2+ bilateral dorsal pedal pulse Neuro: Alert and oriented. No facial asymmetry. No focal deficit. Moves all extremities spontaneously. Musculoskeletal: Normal muscle tone without kyphosis Psych:  Responds to questions appropriately with a normal affect.    Assessment: 76 year old female with the known coronary disease surgical bypass graft essential hypertension makes hyperlipidemia with acute on chronic systolic dysfunction congestive heart failure possibly exacerbated by COPD and hypoxia and/or bronchitis with an elevated troponin more consistent with demand ischemia rather than acute coronary  syndrome  Plan: 1. Continue supportive care of bronchitis and possible inflammatory lung disease 2. BiPAP for oxygenation 3. No further intervention of elevated troponin consistent with demand ischemia 4. Intravenous Lasix for pulmonary edema 5. Diltiazem for continued maintenance of normal sinus rhythm 6. No further cardiac diagnostics necessary at this time  Signed, Lamar Blinks M.D. [facility] 07/27/2016, 5:13 PM

## 2016-07-27 NOTE — Progress Notes (Signed)
Patient lethargic. Responding to voice. Labored breathing. Lungs clear diminished. Patient baseline alert and oriented x4. Md notified. ABG and BIPAP ordered. Refused BIPAP per respiratory. MD notified. Transfer order placed. Report given to Syra.

## 2016-07-27 NOTE — Progress Notes (Signed)
Rivaroxaban dose changed to 15 mg daily for CrCl <30 and a-fib indication per MED 710-02.

## 2016-07-27 NOTE — Progress Notes (Addendum)
Family at bedside have been updated per pt's request, pt is tolerating being off bipap and on 4 l [place] oxygen. Eating dinner and taking fluid adequately. No ss of dsitress noted. Dr Gwen Pounds has been at bedside and assessed pt, aware of elevated troponins.

## 2016-07-28 LAB — HEMOGLOBIN A1C
Hgb A1c MFr Bld: 7.5 % — ABNORMAL HIGH (ref 4.8–5.6)
Mean Plasma Glucose: 169 mg/dL

## 2016-07-28 LAB — BASIC METABOLIC PANEL WITH GFR
Anion gap: 9 (ref 5–15)
BUN: 37 mg/dL — ABNORMAL HIGH (ref 6–20)
CO2: 28 mmol/L (ref 22–32)
Calcium: 8.4 mg/dL — ABNORMAL LOW (ref 8.9–10.3)
Chloride: 103 mmol/L (ref 101–111)
Creatinine, Ser: 1.49 mg/dL — ABNORMAL HIGH (ref 0.44–1.00)
GFR calc Af Amer: 38 mL/min — ABNORMAL LOW
GFR calc non Af Amer: 33 mL/min — ABNORMAL LOW
Glucose, Bld: 180 mg/dL — ABNORMAL HIGH (ref 65–99)
Potassium: 4.1 mmol/L (ref 3.5–5.1)
Sodium: 140 mmol/L (ref 135–145)

## 2016-07-28 LAB — CBC
HCT: 29 % — ABNORMAL LOW (ref 35.0–47.0)
Hemoglobin: 8.9 g/dL — ABNORMAL LOW (ref 12.0–16.0)
MCH: 23 pg — ABNORMAL LOW (ref 26.0–34.0)
MCHC: 30.6 g/dL — ABNORMAL LOW (ref 32.0–36.0)
MCV: 75 fL — ABNORMAL LOW (ref 80.0–100.0)
Platelets: 270 K/uL (ref 150–440)
RBC: 3.86 MIL/uL (ref 3.80–5.20)
RDW: 19.5 % — ABNORMAL HIGH (ref 11.5–14.5)
WBC: 24.1 K/uL — ABNORMAL HIGH (ref 3.6–11.0)

## 2016-07-28 LAB — GLUCOSE, CAPILLARY
GLUCOSE-CAPILLARY: 304 mg/dL — AB (ref 65–99)
GLUCOSE-CAPILLARY: 219 mg/dL — AB (ref 65–99)
Glucose-Capillary: 195 mg/dL — ABNORMAL HIGH (ref 65–99)
Glucose-Capillary: 238 mg/dL — ABNORMAL HIGH (ref 65–99)

## 2016-07-28 LAB — ECHOCARDIOGRAM COMPLETE
Height: 64 in
Weight: 2307.2 oz

## 2016-07-28 LAB — PROCALCITONIN: Procalcitonin: 6.58 ng/mL

## 2016-07-28 MED ORDER — TRAMADOL HCL 50 MG PO TABS
50.0000 mg | ORAL_TABLET | Freq: Four times a day (QID) | ORAL | Status: DC | PRN
  Administered 2016-07-31 – 2016-08-03 (×6): 50 mg via ORAL
  Filled 2016-07-28 (×6): qty 1

## 2016-07-28 MED ORDER — LISINOPRIL 20 MG PO TABS
40.0000 mg | ORAL_TABLET | Freq: Every day | ORAL | Status: DC
  Administered 2016-07-28 – 2016-08-03 (×7): 40 mg via ORAL
  Filled 2016-07-28 (×8): qty 2

## 2016-07-28 MED ORDER — IPRATROPIUM-ALBUTEROL 0.5-2.5 (3) MG/3ML IN SOLN
3.0000 mL | Freq: Three times a day (TID) | RESPIRATORY_TRACT | Status: DC
  Administered 2016-07-28 – 2016-08-03 (×18): 3 mL via RESPIRATORY_TRACT
  Filled 2016-07-28 (×19): qty 3

## 2016-07-28 MED ORDER — FAMOTIDINE 20 MG PO TABS
20.0000 mg | ORAL_TABLET | Freq: Two times a day (BID) | ORAL | Status: DC | PRN
  Administered 2016-07-28: 20 mg via ORAL
  Filled 2016-07-28: qty 1

## 2016-07-28 NOTE — Progress Notes (Signed)
Received report from Manya Silvas, RN. Assuming care at this time.

## 2016-07-28 NOTE — Progress Notes (Signed)
PT Cancellation Note  Patient Details Name: Robin [name] MRN: [id number] DOB: Aug 03, 1940   Cancelled Treatment:    Reason Eval/Treat Not Completed: Fatigue/lethargy limiting ability to participate.  Declined, and will check later as time and pt allow.   Ivar Drape 07/28/2016, 11:19 AM   Samul Dada, PT MS Acute Rehab Dept. Number: [facility] [id number] and [facility] [phone]

## 2016-07-28 NOTE — Progress Notes (Signed)
Patient transferred to 2A. Report given to Brett Canales, Charity fundraiser. Patient placed on telemetry.  Trudee Kuster

## 2016-07-28 NOTE — Progress Notes (Signed)
Name: Robin [name] MRN: [id number] DOB: Mar 21, 1940    ADMISSION DATE:  07/26/2016  CHIEF COMPLAINT: Shortness of breath  BRIEF PATIENT DESCRIPTION: 76 year old female with severe shortness of breath secondary to CHF exacerbation.  SIGNIFICANT EVENTS  5/17 Patient presented to [facility] with severe shortness of breath secondary to CHF exacerbation. 5/17 Patient transferred to the ICU due to shortness of breath requiring BiPAP.  STUDIES:  05/02/16 echo>>The cavity size was moderately dilated. Systolic  function was severely reduced. The estimated ejection fraction  was in the range of 25% to 30%   HISTORY OF PRESENT ILLNESS: Robin [name] is a 76 year old female with known history of Afib, CHF,Asthma,CAD,DM, Hypertension and Hyperlipidemia.Patient has known history of CHF with Low EF.  She has been admitted in the past with similar symptoms.  However she states that tonight her symptoms are much worse.  She has a hard time in speaking in complete sentences. She was worked up for CHF exacerbation. Her BNP- 2498.  Patient received lasix X1, Methylprednisone X1. Patient was placed on BiPAP and sent to the ICU for further monitoring.   SUBJECTIVE: Off BiPAP for a greater part of the evening but placed back at ~10ppm. Now off and doing well on 4L [place]. Offers no complaints.   VITAL SIGNS: Temp:  [97.8 F (36.6 C)-98.1 F (36.7 C)] 98.1 F (36.7 C) (05/19 0100) Pulse Rate:  [58-64] 62 (05/19 0700) Resp:  [11-22] 16 (05/19 0700) BP: (113-142)/(40-120) 142/62 (05/19 0700) SpO2:  [93 %-98 %] 96 % (05/19 0700) FiO2 (%):  [32 %] 32 % (05/19 0335) Weight:  [148 lb 2.4 oz (67.2 kg)] 148 lb 2.4 oz (67.2 kg) (05/19 0500)  PHYSICAL EXAMINATION: General: NAD Neuro:  Awake, Alert and Oriented X 4 , no focal deficits HEENT:  PERRLA, no JVD Cardiovascular:  S1s2,regular, no m/r/g  Lungs: diminished , no wheezes, crackles,rhonchi Abdomen:  Soft,NT,ND,+BS Musculoskeletal:  No edema, cyanosis Skin:   Warm,dry and Intact   Recent Labs Lab 07/26/16 2056 07/27/16 0341 07/28/16 0442  NA 139 139 140  K 3.6 4.2 4.1  CL 105 105 103  CO2 BUN 18 19 37*  CREATININE 1.16* 1.32* 1.49*  GLUCOSE 178* 366* 180*    Recent Labs Lab 07/26/16 2056 07/27/16 0341 07/28/16 0442  HGB 10.0* 9.8* 8.9*  HCT 31.8* 31.9* 29.0*  WBC 12.6* 19.9* 24.1*  PLT 316 321 270   Dg Chest 2 View  Result Date: 07/26/2016 CLINICAL DATA:  Chest pain and shortness of breath beginning this morning. Wheezing. EXAM: CHEST  2 VIEW COMPARISON:  06/12/2016 FINDINGS: Cardiomegaly remains stable. Diffuse interstitial infiltrates have increased since previous study, likely due to pulmonary edema. Tiny bilateral pleural effusions seen. No evidence of pulmonary consolidation. Prior CABG again noted IMPRESSION: Mild congestive heart failure and tiny bilateral pleural effusions. Electronically Signed   By: Myles Rosenthal M.D.   On: 07/26/2016 20:28    ASSESSMENT / PLAN: Acute on Chronic Respiratory failure secondary to CHF exacerbation Metabolic Acidosis Atrial Fibrillation Acute CHF exacerbation-LVEF 15% Mitral regurgitation CAP Asthma Hypertension Hyperlipidemia CAD Diabetes Melitus Elevated troponin possibly related to Demand Ischemia   Plan Continue BiPAP qhs PRN Cardiology following ABG prn Bronchodilators Continue Aspirin Continue Atorvastatin Continue Rivaroxaban Continue Metoprolol/diltiazem Blood sugar checks with SSI coverage. Continue Steroids, wean  Azithromycin and ceftriaxone for CAP  Patient is stable for floor transfer Plan of care discussed with Dr. Renold Don. Tukov ANP-BC Pulmonary and Critical Care  Medicine [facility] Pager [phone] or [phone]  07/28/2016, 7:47 AM

## 2016-07-28 NOTE — Progress Notes (Signed)
[facility] [place] at [facility]   PATIENT NAME: Robin [name]    MR#:  [id number]  DATE OF BIRTH:  12-31-1940  SUBJECTIVE:  CHIEF COMPLAINT:   Chief Complaint  Patient presents with  . 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 Her chest x-ray revealed mild congestive heart failure and bilateral pleural effusions. Physical exam was concerning for COPD exacerbation. Patient was given Lasix, nebulizer therapy and improved clinically, now weaned off BiPAP and is on oxygen therapy per  Nasal cannula. Patient feels better today.    . Review of Systems  Constitutional: Negative for chills, fever and weight loss.  HENT: Negative for congestion.   Eyes: Negative for blurred vision and double vision.  Respiratory: Positive for cough. Negative for sputum production, shortness of breath and wheezing.   Cardiovascular: Negative for chest pain, palpitations, orthopnea, leg swelling and PND.  Gastrointestinal: Negative for abdominal pain, blood in stool, constipation, diarrhea, nausea and vomiting.  Genitourinary: Negative for dysuria, frequency, hematuria and urgency.  Musculoskeletal: Negative for falls.  Neurological: Negative for dizziness, tremors, focal weakness and headaches.  Endo/Heme/Allergies: Does not bruise/bleed easily.  Psychiatric/Behavioral: Negative for depression. The patient does not have insomnia.     VITAL SIGNS: Blood pressure (!) 118/48, pulse 60, temperature 98.4 F (36.9 C), temperature source  Oral, resp. rate 15, height  (1.626 m), weight 67.2 kg (148 lb 2.4 oz), SpO2 96 %.  PHYSICAL EXAMINATION:   GENERAL:  76 y.o.-year-old patient lying in the bed in mild to moderate respiratory distress, intermittently tachypneic.  EYES: Pupils equal, round, reactive to light and accommodation. No scleral icterus. Extraocular muscles intact.  HEENT: Head atraumatic, normocephalic. Oropharynx and nasopharynx clear.  NECK:  Supple, no jugular venous distention. No thyroid enlargement, no tenderness.  LUNGS: Diminished breath sounds bilaterally, scattered diffuse wheezing, few bilateral rales,rhonchi , crepitations at bases, especially on the left. Intermittent use of accessory muscles of respiration.  CARDIOVASCULAR: S1, S2 . Rhythm was regular. 2/6 systolic murmur precordially, no rubs, or gallops.  ABDOMEN: Soft, nontender, nondistended. Bowel sounds present. No organomegaly or mass.  EXTREMITIES: 2+ lower extremity and pedal edema bilaterally, no cyanosis, or clubbing.  NEUROLOGIC: Cranial nerves II through XII are intact. Muscle strength 5/5 in all extremities. Sensation intact. Gait not checked.  PSYCHIATRIC: The patient is alert and oriented x 3.  SKIN: No obvious rash, lesion, or ulcer.   ORDERS/RESULTS REVIEWED:   CBC  Recent Labs Lab 07/26/16 2056 07/27/16 0341 07/28/16 0442  WBC 12.6* 19.9* 24.1*  HGB 10.0* 9.8* 8.9*  HCT 31.8* 31.9* 29.0*  PLT 316 321 270  MCV 74.9* 75.7* 75.0*  MCH 23.5* 23.2* 23.0*  MCHC 31.4* 30.6* 30.6*  RDW 19.5* 20.0* 19.5*   ------------------------------------------------------------------------------------------------------------------  Chemistries   Recent Labs Lab 07/26/16 2056 07/27/16 0341 07/28/16 0442  NA 139 139 140  K 3.6 4.2 4.1  CL 105 105 103  CO2 GLUCOSE 178* 366* 180*  BUN 18 19 37*  CREATININE 1.16* 1.32* 1.49*  CALCIUM 9.1 8.8* 8.4*    ------------------------------------------------------------------------------------------------------------------ estimated creatinine clearance  is 30.7 mL/min (A) (by C-G formula based on SCr of 1.49 mg/dL (H)). ------------------------------------------------------------------------------------------------------------------ No results for input(s): TSH, T4TOTAL, T3FREE, THYROIDAB in the last 72 hours.  Invalid input(s): FREET3  Cardiac Enzymes  Recent Labs Lab 07/27/16 0341 07/27/16 0924 07/27/16 1511  TROPONINI 0.07* 0.43* 1.01*   ------------------------------------------------------------------------------------------------------------------ Invalid input(s): POCBNP ---------------------------------------------------------------------------------------------------------------  RADIOLOGY: Dg Chest 2 View  Result Date: 07/26/2016 CLINICAL DATA:  Chest pain and shortness of breath beginning this morning. Wheezing. EXAM: CHEST  2 VIEW COMPARISON:  06/12/2016 FINDINGS: Cardiomegaly remains stable. Diffuse interstitial infiltrates have increased since previous study, likely due to pulmonary edema. Tiny bilateral pleural effusions seen. No evidence of pulmonary consolidation. Prior CABG again noted IMPRESSION: Mild congestive heart failure and tiny bilateral pleural effusions. Electronically Signed   By: Myles Rosenthal M.D.   On: 07/26/2016 20:28    EKG:  Orders placed or performed during the hospital encounter of 07/26/16  . ED EKG within 10 minutes  . ED EKG within 10 minutes    ASSESSMENT AND PLAN:  Principal Problem:   Acute on chronic systolic CHF (congestive heart failure) (HCC) Active Problems:   HTN (hypertension)   HLD (hyperlipidemia)   CAD (coronary artery disease)   Diabetes (HCC)  #1. Acute respiratory failure with hypoxia and mild hypercapnia, now off BiPAP, continue oxygen therapy, wean off oxygen as tolerated. Patient is not on oxygen at home  Respiratory  failure improving, patient on the BiPAP, transfer the patient to regular floor out of ICU.   #2. Acute on chronic systolic CHF, continue diuretics, follow ins and outs, weight, oxygenation; improving, continue IV Lasix for today, changed to by mouth Lasix tomorrow. And continue to monitor urine output.    #3. COPD exacerbation due to acute bronchitis, continue patient on nebulizing therapy, inhalers, follow clinically  #4. History of atrial fibrillation, now in sinus rhythm, continue patient on Xarelto, heart rate is well controlled  #5. CK D stage III, stable, followed with diuresis  #6. Elevated troponin, likely demand ischemia, no significant chest pains at present, follow closely, continue patient on aspirin, Lipitor, Cardizem, cardiologist consultation is requested #7. Acute bronchitis, continue Zithromax, patient's pro-calcitonin level is elevated #8. Leukocytosis, likely due to steroids, follow closely #9 deconditioning: Physical therapy consult today.  Management plans discussed with the patient, family and they are in agreement.   DRUG ALLERGIES:  Allergies  Allergen Reactions  . Morphine And Related Other (See Comments)    Patient states it makes her "space out"    CODE STATUS:     Code Status Orders        Start     Ordered   07/27/16 0021  Full code  Continuous     07/27/16 0020    Code Status History    Date Active Date Inactive Code Status Order ID Comments User Context   06/07/2016  9:35 PM 06/09/2016  5:15 PM Full Code [id number]  Auburn Bilberry, MD ED   05/02/2016  4:56 PM 05/03/2016  8:01 PM Full Code [id number]  Houston Siren, MD Inpatient   03/02/2016  3:34 PM 03/05/2016  4:16 PM Full Code [id number]  Gilda Crease, Latina Craver, MD Inpatient   02/28/2016  2:43 AM 03/02/2016  3:33 PM Full Code [id number]  Oralia Manis, MD Inpatient   02/06/2016  7:17 PM 02/10/2016 11:14 PM Full Code [id number]  Wyatt Haste, MD ED   01/24/2016  2:00 PM 01/25/2016  7:15 PM Full  Code [id number]  Auburn Bilberry, MD Inpatient  11/25/2015  3:40 PM 11/26/2015  3:49 PM Full Code [id number]  Houston Siren, MD Inpatient   11/05/2015 12:33 AM 11/06/2015  6:05 PM Full Code [id number]  Oralia Manis, MD Inpatient   09/13/2015 10:10 AM 09/15/2015  7:33 PM Full Code [id number]  Altamese Dilling, MD Inpatient      TOTAL  TIME TAKING CARE OF THIS PATIENT: 35 minutes.    Katha Hamming M.D on 07/28/2016 at 9:07 AM  Between 7am to 6pm - Pager - [phone]  After 6pm go to [email or web address] - password [email or web address] [facility]  [facility] [place] [facility]  Office  [phone]  CC: Primary care physician; Leanna Sato, MD

## 2016-07-28 NOTE — Progress Notes (Signed)
VSS O/N, pt. On Bipap for 6 hours and rested well. No care concerns at this time. Report given to [facility].

## 2016-07-28 NOTE — Progress Notes (Signed)
[facility] Encounter Note  Patient: Robin [name] / Admit Date: 07/26/2016 / Date of Encounter: 07/28/2016, 6:43 AM   Subjective: Patient is significantly improved from yesterday with less hypoxia and shortness of breath. Patient is able to go to nasal cannula oxygen instead of BiPAP. The patient has had significant pleural effusion and lower extremity edema consistent with acute on chronic systolic and diastolic dysfunction heart failure. Echocardiogram shows severe combined systolic and diastolic dysfunction heart failure with ejection fraction of 15% and mild to moderate aortic valve stenosis with moderate to severe mitral and tricuspid regurgitation and pulmonary hypertension likely contributing to her current problems  Review of Systems: Positive for: Shortness of breath Negative for: Vision change, hearing change, syncope, dizziness, nausea, vomiting,diarrhea, bloody stool, stomach pain, cough, congestion, diaphoresis, urinary frequency, urinary pain,skin lesions, skin rashes Others previously listed  Objective: Telemetry: Atrial fibrillation with controlled ventricular rate Physical Exam: Blood pressure (!) 139/120, pulse 64, temperature 98.1 F (36.7 C), temperature source Axillary, resp. rate 15, height  (1.626 m), weight 67.2 kg (148 lb 2.4 oz), SpO2 95 %. Body mass index is 25.43 kg/m. General: Well developed, well nourished, in no acute distress. Head: Normocephalic, atraumatic, sclera non-icteric, no xanthomas, nares are without discharge. Neck: No apparent masses Lungs: Normal respirations with no wheezes, no rhonchi, basilar rales , some crackles decreased breath sounds in the bases  Heart: Irregular rate and rhythm, normal S1 S2, 2-3+ aortic murmur, no rub, no gallop, PMI is normal size and placement, carotid upstroke normal without bruit, jugular venous pressure normal Abdomen: Soft, non-tender, non-distended with normoactive bowel sounds. No  hepatosplenomegaly. Abdominal aorta is normal size without bruit Extremities: Trace to 1+ edema, no clubbing, no cyanosis, no ulcers,  Peripheral: 2+ radial, 2+ femoral, 2+ dorsal pedal pulses Neuro: Alert and oriented. Moves all extremities spontaneously. Psych:  Responds to questions appropriately with a normal affect.   Intake/Output Summary (Last 24 hours) at 07/28/16 0643 Last data filed at 07/28/16 0600  Gross per 24 hour  Intake              970 ml  Output             1170 ml  Net             -200 ml    Inpatient Medications:  . aspirin  81 mg Oral Daily  . atorvastatin  80 mg Oral q1800  . azithromycin  500 mg Oral Daily  . budesonide (PULMICORT) nebulizer solution  0.5 mg Nebulization BID  . diltiazem  180 mg Oral Daily  . feeding supplement (GLUCERNA SHAKE)  237 mL Oral TID BM  . furosemide  20 mg Intravenous BID  . insulin aspart  0-5 Units Subcutaneous QHS  . insulin aspart  0-9 Units Subcutaneous TID WC  . ipratropium-albuterol  3 mL Nebulization Q4H  . LORazepam  1 mg Oral BID  . methylPREDNISolone (SOLU-MEDROL) injection  40 mg Intravenous Q12H  . metoprolol tartrate  100 mg Oral BID  . nitroGLYCERIN  1 inch Topical Q6H  . pantoprazole  40 mg Oral Daily  . rivaroxaban  15 mg Oral Q supper   Infusions:  . cefTRIAXone (ROCEPHIN)  IV      Labs:  Recent Labs  07/27/16 0341 07/28/16 0442  NA 139 140  K 4.2 4.1  CL 105 103  CO2 22 28  GLUCOSE 366* 180*  BUN 19 37*  CREATININE 1.32* 1.49*  CALCIUM 8.8* 8.4*  No results for input(s): AST, ALT, ALKPHOS, BILITOT, PROT, ALBUMIN in the last 72 hours.  Recent Labs  07/27/16 0341 07/28/16 0442  WBC 19.9* 24.1*  HGB 9.8* 8.9*  HCT 31.9* 29.0*  MCV 75.7* 75.0*  PLT 321 270    Recent Labs  07/26/16 2056 07/27/16 0341 07/27/16 0924 07/27/16 1511  TROPONINI 0.05* 0.07* 0.43* 1.01*   Invalid input(s): POCBNP  Recent Labs  07/27/16 0924  HGBA1C 7.5*     Weights: Filed Weights   07/26/16  1954 07/27/16 0045 07/28/16 0500  Weight: 66.2 kg (146 lb) 65.4 kg (144 lb 3.2 oz) 67.2 kg (148 lb 2.4 oz)     Radiology/Studies:  Dg Chest 2 View  Result Date: 07/26/2016 CLINICAL DATA:  Chest pain and shortness of breath beginning this morning. Wheezing. EXAM: CHEST  2 VIEW COMPARISON:  06/12/2016 FINDINGS: Cardiomegaly remains stable. Diffuse interstitial infiltrates have increased since previous study, likely due to pulmonary edema. Tiny bilateral pleural effusions seen. No evidence of pulmonary consolidation. Prior CABG again noted IMPRESSION: Mild congestive heart failure and tiny bilateral pleural effusions. Electronically Signed   By: Myles Rosenthal M.D.   On: 07/26/2016 20:28     Assessment and Recommendation  76 y.o. female with essential hypertension paroxysmal nonvalvular atrial fibrillation coronary artery disease status post coronary bypass graft mixed hyperlipidemia having acute on chronic combined systolic diastolic dysfunction congestive heart failure with pleural effusions and pulmonary edema lower extremity edema and hypoxia slightly improved overnight with intravenous Lasix without evidence of myocardial infarction 1. Continue Lasix and possible intravenous Lasix to changing to oral Lasix if able for treatment of congestive heart failure 2. Further consideration of treatment of pleural effusion with thoracentesis if this significantly large enough to address 3. Continue diltiazem and metoprolol for heart rate control of atrial fibrillation with a goal heart rate between 60 and 90 bpm 4. No further cardiac diagnostics necessary at this time although aortic valve stenosis may be contributing to combined congestive heart failure 5. Possible that moved to the floor today if able  Signed, Arnoldo Hooker M.D. FACC

## 2016-07-29 LAB — GLUCOSE, CAPILLARY
GLUCOSE-CAPILLARY: 223 mg/dL — AB (ref 65–99)
GLUCOSE-CAPILLARY: 204 mg/dL — AB (ref 65–99)
GLUCOSE-CAPILLARY: 190 mg/dL — AB (ref 65–99)
Glucose-Capillary: 209 mg/dL — ABNORMAL HIGH (ref 65–99)

## 2016-07-29 LAB — PROCALCITONIN: Procalcitonin: 4.52 ng/mL

## 2016-07-29 MED ORDER — INSULIN ASPART 100 UNIT/ML ~~LOC~~ SOLN
0.0000 [IU] | Freq: Three times a day (TID) | SUBCUTANEOUS | Status: DC
  Administered 2016-07-29 (×2): 5 [IU] via SUBCUTANEOUS
  Administered 2016-07-30: 8 [IU] via SUBCUTANEOUS
  Administered 2016-07-30 (×2): 5 [IU] via SUBCUTANEOUS
  Administered 2016-07-31: 11 [IU] via SUBCUTANEOUS
  Administered 2016-07-31: 5 [IU] via SUBCUTANEOUS
  Administered 2016-07-31: 15 [IU] via SUBCUTANEOUS
  Administered 2016-08-01: 11 [IU] via SUBCUTANEOUS
  Administered 2016-08-01 (×2): 5 [IU] via SUBCUTANEOUS
  Administered 2016-08-02: 3 [IU] via SUBCUTANEOUS
  Administered 2016-08-02: 2 [IU] via SUBCUTANEOUS
  Administered 2016-08-02: 5 [IU] via SUBCUTANEOUS
  Administered 2016-08-03: 2 [IU] via SUBCUTANEOUS
  Administered 2016-08-03: 5 [IU] via SUBCUTANEOUS
  Filled 2016-07-29: qty 11
  Filled 2016-07-29: qty 2
  Filled 2016-07-29 (×3): qty 5
  Filled 2016-07-29: qty 8
  Filled 2016-07-29 (×4): qty 5
  Filled 2016-07-29: qty 2
  Filled 2016-07-29: qty 3
  Filled 2016-07-29: qty 11
  Filled 2016-07-29: qty 15
  Filled 2016-07-29: qty 5

## 2016-07-29 MED ORDER — LOPERAMIDE HCL 2 MG PO CAPS
2.0000 mg | ORAL_CAPSULE | ORAL | Status: DC | PRN
  Administered 2016-07-29 – 2016-08-03 (×2): 2 mg via ORAL
  Filled 2016-07-29 (×2): qty 1

## 2016-07-29 NOTE — Progress Notes (Signed)
24 g inserted on the left lateral part of the pinky finger. Patient tolerated well.

## 2016-07-29 NOTE — Progress Notes (Signed)
Patient woke up impulsive and took her BIPAP off. Demanded her oxygen to be taken off.  patient also pulled her 2 IVs out. Refused to follow instructions. No acute distress noted. Will continue to monitor.

## 2016-07-29 NOTE — Progress Notes (Signed)
[facility] - [place] at [facility]   PATIENT NAME: Robin [name]    MR#:  [id number]  DATE OF BIRTH:  [birth year]  SUBJECTIVE: .transfer from ICU yesterday. Patient confused at times. No shortness of breath.  CHIEF COMPLAINT:   Chief Complaint  Patient presents with  . 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 Her chest x-ray revealed mild congestive heart failure and bilateral pleural effusions. Physical exam was concerning for COPD exacerbation. Patient was given Lasix, nebulizer therapy and improved clinically, now weaned off BiPAP and is on oxygen therapy per  Nasal cannula. Patient feels better today.    . Review of Systems  Constitutional: Negative for chills, fever and weight loss.  HENT: Negative for congestion.   Eyes: Negative for blurred vision and double vision.  Respiratory: Positive for cough. Negative for sputum production, shortness of breath and wheezing.   Cardiovascular: Negative for chest pain, palpitations, orthopnea, leg swelling and PND.  Gastrointestinal: Negative for abdominal pain, blood in stool, constipation, diarrhea, nausea and vomiting.  Genitourinary: Negative for dysuria, frequency, hematuria and urgency.  Musculoskeletal: Negative for falls.  Neurological: Negative for dizziness, tremors, focal weakness and headaches.  Endo/Heme/Allergies: Does not bruise/bleed easily.  Psychiatric/Behavioral: Negative for depression. The patient does not have insomnia.     VITAL SIGNS: Blood  pressure 134/69, pulse 97, temperature 98.6 F (37 C), resp. rate 16, height 5\' 4"  (1.626 m), weight 61.9 kg (136 lb 8 oz), SpO2 97 %.  PHYSICAL EXAMINATION:   GENERAL:  76 y.o.-year-old patient lying in the bed in mild to moderate respiratory distress, intermittently tachypneic.  EYES: Pupils equal, round, reactive to light and accommodation. No scleral icterus. Extraocular muscles intact.  HEENT: Head atraumatic, normocephalic. Oropharynx and nasopharynx clear.  NECK:  Supple, no jugular venous distention. No thyroid enlargement, no tenderness.  LUNGS: Diminished breath sounds bilaterally, scattered diffuse wheezing, few bilateral rales,rhonchi , crepitations at bases, especially on the left. Intermittent use of accessory muscles of respiration.  CARDIOVASCULAR: S1, S2 . Rhythm was regular. 2/6 systolic murmur precordially, no rubs, or gallops.  ABDOMEN: Soft, nontender, nondistended. Bowel sounds present. No organomegaly or mass.  EXTREMITIES: 2+ lower extremity and pedal edema bilaterally, no cyanosis, or clubbing.  NEUROLOGIC: Cranial nerves II through XII are intact. Muscle strength 5/5 in all extremities. Sensation intact. Gait not checked.  PSYCHIATRIC: The patient is alert and oriented x 3.  SKIN: No obvious rash, lesion, or ulcer.   ORDERS/RESULTS REVIEWED:   CBC  Recent Labs Lab 07/26/16 2056 07/27/16 0341 07/28/16 0442  WBC 12.6* 19.9* 24.1*  HGB 10.0* 9.8* 8.9*  HCT 31.8* 31.9* 29.0*  PLT 316 321 270  MCV 74.9* 75.7* 75.0*  MCH 23.5* 23.2* 23.0*  MCHC 31.4* 30.6* 30.6*  RDW 19.5* 20.0* 19.5*   ------------------------------------------------------------------------------------------------------------------  Chemistries   Recent Labs Lab 07/26/16 2056 07/27/16 0341 07/28/16 0442  NA 139 139 140  K 3.6 4.2 4.1  CL 105 105 103  CO2 23 22 28   GLUCOSE 178* 366* 180*  BUN 18 19 37*  CREATININE 1.16* 1.32* 1.49*  CALCIUM 9.1 8.8*  8.4*    ------------------------------------------------------------------------------------------------------------------ estimated creatinine clearance is 28.2 mL/min (A) (by C-G formula based on SCr of 1.49 mg/dL (H)). ------------------------------------------------------------------------------------------------------------------ No results for input(s): TSH, T4TOTAL, T3FREE, THYROIDAB in the last 72 hours.  Invalid input(s): FREET3  Cardiac Enzymes  Recent Labs Lab 07/27/16 0341 07/27/16 0924 07/27/16 1511  TROPONINI 0.07* 0.43* 1.01*   ------------------------------------------------------------------------------------------------------------------ Invalid input(s): POCBNP ---------------------------------------------------------------------------------------------------------------  RADIOLOGY: No results found.  EKG:  Orders placed or performed during the hospital encounter of 07/26/16  . ED EKG within 10 minutes  . ED EKG within 10 minutes    ASSESSMENT AND PLAN:  Principal Problem:   Acute on chronic systolic CHF (congestive heart failure) (HCC) Active Problems:   HTN (hypertension)   HLD (hyperlipidemia)   CAD (coronary artery disease)   Diabetes (HCC)  #1. Acute respiratory failure with hypoxia and mild hypercapnia, now off BiPAP, continue oxygen therapy, wean off oxygen as tolerated. Patient is not on oxygen at home  Respiratory failure improving, patient on the BiPAP, transfer the patient to regular floor out of ICU.   #2. Acute on chronic systolic CHF, continue diuretics, follow ins and outs, weight, oxygenation; improving, continue IV Lasix for today, changed to by mouth Lasix tomorrow. And continue to monitor urine output.    #3. COPD exacerbation due to acute bronchitis, continue patient on nebulizing therapy, inhalers, follow clinically  #4. History of atrial fibrillation, now in sinus rhythm, continue patient on Xarelto, heart rate is well  controlled  #5. CK D stage III, stable, followed with diuresis  #6. Elevated troponin, likely demand ischemia, no significant chest pains at present, follow closely, continue patient on aspirin, Lipitor, Cardizem, cardiologist consultation is requested  #7. Acute bronchitis, continue Zithromax, patient's pro-calcitonin level is elevated  #8. Leukocytosis, likely due to steroids, follow closely #9 deconditioning: Physical therapy consult today.  Management plans discussed with the patient, family and they are in agreement.   DRUG ALLERGIES:  Allergies  Allergen Reactions  . Morphine And Related Other (See Comments)    Patient states it makes her "space out"    CODE STATUS:     Code Status Orders        Start     Ordered   07/27/16 0021  Full code  Continuous     07/27/16 0020    Code Status History    Date Active Date Inactive Code Status Order ID Comments User Context   06/07/2016  9:35 PM 06/09/2016  5:15 PM Full Code [id number]  Auburn Bilberry, MD ED   05/02/2016  4:56 PM 05/03/2016  8:01 PM Full Code [id number]  Houston Siren, MD Inpatient   03/02/2016  3:34 PM 03/05/2016  4:16 PM Full Code [id number]  Schnier, Latina Craver, MD Inpatient   02/28/2016  2:43 AM 03/02/2016  3:33 PM Full Code [id number]  Oralia Manis, MD Inpatient   02/06/2016  7:17 PM 02/10/2016 11:14 PM Full Code [id number]  Hower, Cletis Athens, MD ED   01/24/2016  2:00 PM 01/25/2016  7:15 PM Full Code [id number]  Auburn Bilberry, MD Inpatient   11/25/2015  3:40 PM 11/26/2015  3:49 PM Full Code [id number]  Houston Siren, MD Inpatient   11/05/2015 12:33 AM 11/06/2015  6:05 PM Full Code [id number]  Oralia Manis, MD Inpatient   09/13/2015 10:10 AM 09/15/2015  7:33 PM Full Code [id number]  Altamese Dilling, MD Inpatient      TOTAL  TIME TAKING CARE OF THIS PATIENT: 35 minutes.    Katha Hamming M.D on 07/29/2016  at 9:34 AM  Between 7am to 6pm - Pager - [phone]  After 6pm go to [email or web address] - password [email or web address]  [facility]  [place] [facility]  Office  [phone]  CC: Primary care physician; Leanna Sato, MD

## 2016-07-29 NOTE — Progress Notes (Signed)
PT Cancellation Note  Patient Details Name: Robin [name] MRN: [id number] DOB: 1940-08-28   Cancelled Treatment:    Reason Eval/Treat Not Completed: Fatigue/lethargy limiting ability to participate   Ezekiel Ina, DPT 07/29/2016, 3:49 PM

## 2016-07-29 NOTE — Progress Notes (Signed)
[facility] Encounter Note  Patient: Robin [name] / Admit Date: 07/26/2016 / Date of Encounter: 07/29/2016, 6:41 AM   Subjective: Patient is continuing to improved from admission with less hypoxia and shortness of breath. Patient is able to go to nasal cannula oxygen instead of BiPAP. The patient has had significant pleural effusion and lower extremity edema consistent with acute on chronic systolic and diastolic dysfunction heart failure. Echocardiogram shows severe combined systolic and diastolic dysfunction heart failure with ejection fraction of 15% and mild to moderate aortic valve stenosis with moderate to severe mitral and tricuspid regurgitation and pulmonary hypertension likely contributing to her current problems. Patient appears to be tolerating medication management fairly well  Review of Systems: Positive for: Shortness of breath Negative for: Vision change, hearing change, syncope, dizziness, nausea, vomiting,diarrhea, bloody stool, stomach pain, cough, congestion, diaphoresis, urinary frequency, urinary pain,skin lesions, skin rashes Others previously listed  Objective: Telemetry: Atrial fibrillation with controlled ventricular rate Physical Exam: Blood pressure 134/69, pulse 97, temperature 98.6 F (37 C), resp. rate 16, height  (1.626 m), weight 61.9 kg (136 lb 8 oz), SpO2 93 %. Body mass index is 23.43 kg/m. General: Well developed, well nourished, in no acute distress. Head: Normocephalic, atraumatic, sclera non-icteric, no xanthomas, nares are without discharge. Neck: No apparent masses Lungs: Normal respirations with no wheezes, no rhonchi, basilar rales , some crackles decreased breath sounds in the bases  Heart: Irregular rate and rhythm, normal S1 S2, 2-3+ aortic murmur, no rub, no gallop, PMI is normal size and placement, carotid upstroke normal without bruit, jugular venous pressure normal Abdomen: Soft, non-tender, non-distended with  normoactive bowel sounds. No hepatosplenomegaly. Abdominal aorta is normal size without bruit Extremities: Trace to 1+ edema, no clubbing, no cyanosis, no ulcers,  Peripheral: 2+ radial, 2+ femoral, 2+ dorsal pedal pulses Neuro: Alert and oriented. Moves all extremities spontaneously. Psych:  Responds to questions appropriately with a normal affect.   Intake/Output Summary (Last 24 hours) at 07/29/16 0641 Last data filed at 07/28/16 1933  Gross per 24 hour  Intake                0 ml  Output              401 ml  Net             -401 ml    Inpatient Medications:  . aspirin  81 mg Oral Daily  . atorvastatin  80 mg Oral q1800  . azithromycin  500 mg Oral Daily  . budesonide (PULMICORT) nebulizer solution  0.5 mg Nebulization BID  . diltiazem  180 mg Oral Daily  . feeding supplement (GLUCERNA SHAKE)  237 mL Oral TID BM  . furosemide  20 mg Intravenous BID  . insulin aspart  0-5 Units Subcutaneous QHS  . insulin aspart  0-9 Units Subcutaneous TID WC  . ipratropium-albuterol  3 mL Nebulization TID  . lisinopril  40 mg Oral Daily  . LORazepam  1 mg Oral BID  . methylPREDNISolone (SOLU-MEDROL) injection  40 mg Intravenous Q12H  . metoprolol tartrate  100 mg Oral BID  . nitroGLYCERIN  1 inch Topical Q6H  . pantoprazole  40 mg Oral Daily  . rivaroxaban  15 mg Oral Q supper   Infusions:  . cefTRIAXone (ROCEPHIN)  IV 1 g (07/28/16 1819)    Labs:  Recent Labs  07/27/16 0341 07/28/16 0442  NA 139 140  K 4.2 4.1  CL 105 103  CO2 22  28  GLUCOSE 366* 180*  BUN 19 37*  CREATININE 1.32* 1.49*  CALCIUM 8.8* 8.4*   No results for input(s): AST, ALT, ALKPHOS, BILITOT, PROT, ALBUMIN in the last 72 hours.  Recent Labs  07/27/16 0341 07/28/16 0442  WBC 19.9* 24.1*  HGB 9.8* 8.9*  HCT 31.9* 29.0*  MCV 75.7* 75.0*  PLT 321 270    Recent Labs  07/26/16 2056 07/27/16 0341 07/27/16 0924 07/27/16 1511  TROPONINI 0.05* 0.07* 0.43* 1.01*   Invalid input(s): POCBNP  Recent  Labs  07/27/16 0924  HGBA1C 7.5*     Weights: Filed Weights   07/27/16 0045 07/28/16 0500 07/29/16 0440  Weight: 65.4 kg (144 lb 3.2 oz) 67.2 kg (148 lb 2.4 oz) 61.9 kg (136 lb 8 oz)     Radiology/Studies:  Dg Chest 2 View  Result Date: 07/26/2016 CLINICAL DATA:  Chest pain and shortness of breath beginning this morning. Wheezing. EXAM: CHEST  2 VIEW COMPARISON:  06/12/2016 FINDINGS: Cardiomegaly remains stable. Diffuse interstitial infiltrates have increased since previous study, likely due to pulmonary edema. Tiny bilateral pleural effusions seen. No evidence of pulmonary consolidation. Prior CABG again noted IMPRESSION: Mild congestive heart failure and tiny bilateral pleural effusions. Electronically Signed   By: Myles Rosenthal M.D.   On: 07/26/2016 20:28     Assessment and Recommendation  76 y.o. female with essential hypertension paroxysmal nonvalvular atrial fibrillation coronary artery disease status post coronary bypass graft mixed hyperlipidemia having acute on chronic combined systolic diastolic dysfunction congestive heart failure with pleural effusions and pulmonary edema lower extremity edema and hypoxia slightly improved overnight with intravenous Lasix without evidence of myocardial infarction 1. Continue Lasix and possible intravenous Lasix to changing to oral Lasix if able for treatment of congestive heart failureAnd/or effusions 2. Further consideration of treatment of pleural effusion with thoracentesis if this significantly large enough to address if patient has continued exacerbation 3. Continue diltiazem and metoprolol for heart rate control of atrial fibrillation with a goal heart rate between 60 and 90 bpm without change today although would begin ambulation and adjustments of medication as necessary 4. No further cardiac diagnostics necessary at this time although aortic valve stenosis may be contributing to combined congestive heart failure 5. Ambulation today and  follow for improvements in begin discharge planning  Signed, Arnoldo Hooker M.D. FACC

## 2016-07-30 LAB — COMPREHENSIVE METABOLIC PANEL
ALT: 15 U/L (ref 14–54)
AST: 13 U/L — AB (ref 15–41)
Albumin: 2.8 g/dL — ABNORMAL LOW (ref 3.5–5.0)
Alkaline Phosphatase: 96 U/L (ref 38–126)
Anion gap: 12 (ref 5–15)
BUN: 41 mg/dL — ABNORMAL HIGH (ref 6–20)
CHLORIDE: 103 mmol/L (ref 101–111)
CO2: 28 mmol/L (ref 22–32)
Calcium: 8.6 mg/dL — ABNORMAL LOW (ref 8.9–10.3)
Creatinine, Ser: 1.4 mg/dL — ABNORMAL HIGH (ref 0.44–1.00)
GFR, EST AFRICAN AMERICAN: 41 mL/min — AB (ref >60)
GFR, EST NON AFRICAN AMERICAN: 36 mL/min — AB (ref >60)
Glucose, Bld: 237 mg/dL — ABNORMAL HIGH (ref 65–99)
POTASSIUM: 3.3 mmol/L — AB (ref 3.5–5.1)
SODIUM: 143 mmol/L (ref 135–145)
Total Bilirubin: 0.9 mg/dL (ref 0.3–1.2)
Total Protein: 6.1 g/dL — ABNORMAL LOW (ref 6.5–8.1)

## 2016-07-30 LAB — MAGNESIUM: MAGNESIUM: 1.9 mg/dL (ref 1.7–2.4)

## 2016-07-30 LAB — CBC WITH DIFFERENTIAL/PLATELET
BASOS ABS: 0 10*3/uL (ref 0–0.1)
Basophils Relative: 0 %
EOS ABS: 0 10*3/uL (ref 0–0.7)
EOS PCT: 0 %
HCT: 33.7 % — ABNORMAL LOW (ref 35.0–47.0)
Hemoglobin: 10.3 g/dL — ABNORMAL LOW (ref 12.0–16.0)
Lymphocytes Relative: 3 %
Lymphs Abs: 0.6 10*3/uL — ABNORMAL LOW (ref 1.0–3.6)
MCH: 23.3 pg — AB (ref 26.0–34.0)
MCHC: 30.7 g/dL — ABNORMAL LOW (ref 32.0–36.0)
MCV: 75.9 fL — ABNORMAL LOW (ref 80.0–100.0)
MONO ABS: 0.4 10*3/uL (ref 0.2–0.9)
Monocytes Relative: 2 %
Neutro Abs: 18.3 10*3/uL — ABNORMAL HIGH (ref 1.4–6.5)
Neutrophils Relative %: 95 %
PLATELETS: 306 10*3/uL (ref 150–440)
RBC: 4.43 MIL/uL (ref 3.80–5.20)
RDW: 20.1 % — AB (ref 11.5–14.5)
WBC: 19.2 10*3/uL — AB (ref 3.6–11.0)

## 2016-07-30 LAB — GLUCOSE, CAPILLARY
GLUCOSE-CAPILLARY: 237 mg/dL — AB (ref 65–99)
GLUCOSE-CAPILLARY: 277 mg/dL — AB (ref 65–99)
GLUCOSE-CAPILLARY: 236 mg/dL — AB (ref 65–99)
GLUCOSE-CAPILLARY: 242 mg/dL — AB (ref 65–99)

## 2016-07-30 MED ORDER — POTASSIUM CHLORIDE CRYS ER 20 MEQ PO TBCR
40.0000 meq | EXTENDED_RELEASE_TABLET | ORAL | Status: AC
  Administered 2016-07-30 (×2): 40 meq via ORAL
  Filled 2016-07-30 (×2): qty 2

## 2016-07-30 MED ORDER — LEVOFLOXACIN 250 MG PO TABS
250.0000 mg | ORAL_TABLET | Freq: Every day | ORAL | Status: DC
  Administered 2016-07-31 – 2016-08-02 (×3): 250 mg via ORAL
  Filled 2016-07-30 (×3): qty 1

## 2016-07-30 NOTE — Progress Notes (Signed)
[facility] Cardiology [facility] Encounter Note  Patient: Robin [name] / Admit Date: 07/26/2016 / Date of Encounter: 07/30/2016, 8:52 AM   Subjective: Patient is continuing to improved from admission with less hypoxia and shortness of breath.   The patient has had significant pleural effusion and lower extremity edema consistent with acute on chronic systolic and diastolic dysfunction heart failure. Echocardiogram shows severe combined systolic and diastolic dysfunction heart failure with ejection fraction of 15% and mild to moderate aortic valve stenosis with moderate to severe mitral and tricuspid regurgitation and pulmonary hypertension likely contributing to her current problems. Patient appears to be tolerating medication management fairly well  Review of Systems: Positive for: Shortness of breath weakness Negative for: Vision change, hearing change, syncope, dizziness, nausea, vomiting,diarrhea, bloody stool, stomach pain, cough, congestion, diaphoresis, urinary frequency, urinary pain,skin lesions, skin rashes Others previously listed  Objective: Telemetry: Atrial fibrillation with controlled ventricular rate Physical Exam: Blood pressure (!) 154/64, pulse 87, temperature 98 F (36.7 C), temperature source Oral, resp. rate 14, height  (1.626 m), weight 62.2 kg (137 lb 1.6 oz), SpO2 97 %. Body mass index is 23.53 kg/m. General: Well developed, well nourished, in no acute distress. Head: Normocephalic, atraumatic, sclera non-icteric, no xanthomas, nares are without discharge. Neck: No apparent masses Lungs: Normal respirations with no wheezes, no rhonchi, basilar rales , some crackles decreased breath sounds in the bases  Heart: Irregular rate and rhythm, normal S1 S2, 2-3+ aortic murmur, no rub, no gallop, PMI is normal size and placement, carotid upstroke normal without bruit, jugular venous pressure normal Abdomen: Soft, non-tender, non-distended with normoactive bowel  sounds. No hepatosplenomegaly. Abdominal aorta is normal size without bruit Extremities: Trace to 1+ edema, no clubbing, no cyanosis, no ulcers,  Peripheral: 2+ radial, 2+ femoral, 2+ dorsal pedal pulses Neuro: Alert and oriented. Moves all extremities spontaneously. Psych:  Responds to questions appropriately with a normal affect.   Intake/Output Summary (Last 24 hours) at 07/30/16 0852 Last data filed at 07/30/16 0000  Gross per 24 hour  Intake              340 ml  Output                0 ml  Net              340 ml    Inpatient Medications:  . aspirin  81 mg Oral Daily  . atorvastatin  80 mg Oral q1800  . azithromycin  500 mg Oral Daily  . budesonide (PULMICORT) nebulizer solution  0.5 mg Nebulization BID  . diltiazem  180 mg Oral Daily  . feeding supplement (GLUCERNA SHAKE)  237 mL Oral TID BM  . furosemide  20 mg Intravenous BID  . insulin aspart  0-15 Units Subcutaneous TID WC  . insulin aspart  0-5 Units Subcutaneous QHS  . ipratropium-albuterol  3 mL Nebulization TID  . lisinopril  40 mg Oral Daily  . LORazepam  1 mg Oral BID  . methylPREDNISolone (SOLU-MEDROL) injection  40 mg Intravenous Q12H  . metoprolol tartrate  100 mg Oral BID  . nitroGLYCERIN  1 inch Topical Q6H  . pantoprazole  40 mg Oral Daily  . rivaroxaban  15 mg Oral Q supper   Infusions:  . cefTRIAXone (ROCEPHIN)  IV Stopped (07/29/16 1759)    Labs:  Recent Labs  07/28/16 0442  NA 140  K 4.1  CL 103  CO2 28  GLUCOSE 180*  BUN 37*  CREATININE 1.49*  CALCIUM 8.4*   No results for input(s): AST, ALT, ALKPHOS, BILITOT, PROT, ALBUMIN in the last 72 hours.  Recent Labs  07/28/16 0442 07/30/16 0751  WBC 24.1* 19.2*  NEUTROABS  --  18.3*  HGB 8.9* 10.3*  HCT 29.0* 33.7*  MCV 75.0* 75.9*  PLT 270 306    Recent Labs  07/27/16 0924 07/27/16 1511  TROPONINI 0.43* 1.01*   Invalid input(s): POCBNP  Recent Labs  07/27/16 0924  HGBA1C 7.5*     Weights: Filed Weights   07/28/16  0500 07/29/16 0440 07/30/16 0340  Weight: 67.2 kg (148 lb 2.4 oz) 61.9 kg (136 lb 8 oz) 62.2 kg (137 lb 1.6 oz)     Radiology/Studies:  Dg Chest 2 View  Result Date: 07/26/2016 CLINICAL DATA:  Chest pain and shortness of breath beginning this morning. Wheezing. EXAM: CHEST  2 VIEW COMPARISON:  06/12/2016 FINDINGS: Cardiomegaly remains stable. Diffuse interstitial infiltrates have increased since previous study, likely due to pulmonary edema. Tiny bilateral pleural effusions seen. No evidence of pulmonary consolidation. Prior CABG again noted IMPRESSION: Mild congestive heart failure and tiny bilateral pleural effusions. Electronically Signed   By: Myles Rosenthal M.D.   On: 07/26/2016 20:28     Assessment and Recommendation  76 y.o. female with essential hypertension paroxysmal nonvalvular atrial fibrillation coronary artery disease status post coronary bypass graft mixed hyperlipidemia having acute on chronic combined systolic diastolic dysfunction congestive heart failure with pleural effusions and pulmonary edema lower extremity edema and hypoxia With significant improvement since admission and beginning rehabilitation  1.  Continue Lasix and changed to oral Lasix at this time for further risk reduction of the pleural effusions and pulmonary edema  2.  Watch for recurrent and worsening pleural effusions which may contribute to shortness of breath  3. Continue diltiazem and metoprolol for heart rate control of atrial fibrillation with a goal heart rate between 60 and 90 bpm without change today although would begin ambulation and adjustments of medication as necessary 4. No further cardiac diagnostics necessary at this time although aortic valve stenosis may be contributing to combined congestive heart failure 5. Ambulation today and follow for improvements in begin discharge planning as able with follow-up from cardiology next week for further adjustments of medication  Signed, Arnoldo Hooker M.D.  FACC

## 2016-07-30 NOTE — Progress Notes (Signed)
[facility] - [place] at [facility]   PATIENT NAME: Robin [name]    MR#:  [id number]  DATE OF BIRTH:  1940/06/14  SUBJECTIVE:  CHIEF COMPLAINT:   Chief Complaint  Patient presents with  . Shortness of Breath    Continues to have some SOB and cough.  . Review of Systems  Constitutional: Negative for chills, fever and weight loss.  HENT: Negative for congestion.   Eyes: Negative for blurred vision and double vision.  Respiratory: Positive for cough. Negative for sputum production, shortness of breath and wheezing.   Cardiovascular: Negative for chest pain, palpitations, orthopnea, leg swelling and PND.  Gastrointestinal: Negative for abdominal pain, blood in stool, constipation, diarrhea, nausea and vomiting.  Genitourinary: Negative for dysuria, frequency, hematuria and urgency.  Musculoskeletal: Negative for falls.  Neurological: Negative for dizziness, tremors, focal weakness and headaches.  Endo/Heme/Allergies: Does not bruise/bleed easily.  Psychiatric/Behavioral: Negative for depression. The patient does not have insomnia.     VITAL SIGNS: Blood pressure (!) 154/64, pulse 87, temperature 98 F (36.7 C), temperature source Oral, resp. rate 14, height 5\' 4"  (1.626 m), weight 62.2 kg (137 lb 1.6 oz), SpO2 97 %.  PHYSICAL EXAMINATION:   GENERAL:  76 y.o.-year-old patient lying in the bed in mild to moderate respiratory distress, intermittently tachypneic.  EYES: Pupils equal, round, reactive to light and accommodation. No scleral icterus. Extraocular muscles intact.  HEENT: Head atraumatic, normocephalic. Oropharynx and nasopharynx clear.  NECK:  Supple, no jugular venous distention. No thyroid enlargement, no tenderness.  LUNGS: Diminished breath sounds bilaterally, scattered diffuse wheezing, few bilateral rales,rhonchi , crepitations at bases, especially on the left. Intermittent use of accessory muscles of respiration.  CARDIOVASCULAR: S1,  S2 . Rhythm was regular. 2/6 systolic murmur precordially, no rubs, or gallops.  ABDOMEN: Soft, nontender, nondistended. Bowel sounds present. No organomegaly or mass.  EXTREMITIES: 2+ lower extremity and pedal edema bilaterally, no cyanosis, or clubbing.  NEUROLOGIC: Cranial nerves II through XII are intact. Muscle strength 5/5 in all extremities. Sensation intact. Gait not checked.  PSYCHIATRIC: The patient is alert and oriented x 3.  SKIN: No obvious rash, lesion, or ulcer.   ORDERS/RESULTS REVIEWED:   CBC  Recent Labs Lab 07/26/16 2056 07/27/16 0341 07/28/16 0442 07/30/16 0751  WBC 12.6* 19.9* 24.1* 19.2*  HGB 10.0* 9.8* 8.9* 10.3*  HCT 31.8* 31.9* 29.0* 33.7*  PLT 316 321 270 306  MCV 74.9* 75.7* 75.0* 75.9*  MCH 23.5* 23.2* 23.0* 23.3*  MCHC 31.4* 30.6* 30.6* 30.7*  RDW 19.5* 20.0* 19.5* 20.1*  LYMPHSABS  --   --   --  0.6*  MONOABS  --   --   --  0.4  EOSABS  --   --   --  0.0  BASOSABS  --   --   --  0.0   ------------------------------------------------------------------------------------------------------------------  Chemistries   Recent Labs Lab 07/26/16 2056 07/27/16 0341 07/28/16 0442 07/30/16 0751  NA 139 139 140 143  K 3.6 4.2 4.1 3.3*  CL 105 105 103 103  CO2 23 22 28 28   GLUCOSE 178* 366* 180* 237*  BUN 18 19 37* 41*  CREATININE 1.16* 1.32* 1.49* 1.40*  CALCIUM 9.1 8.8* 8.4* 8.6*  MG  --   --   --  1.9  AST  --   --   --  13*  ALT  --   --   --  15  ALKPHOS  --   --   --  96  BILITOT  --   --   --  0.9   ------------------------------------------------------------------------------------------------------------------ estimated creatinine clearance is 30 mL/min (A) (by C-G formula based on SCr of 1.4 mg/dL (H)). ------------------------------------------------------------------------------------------------------------------ No results for input(s): TSH, T4TOTAL, T3FREE, THYROIDAB in the last 72 hours.  Invalid input(s): FREET3  Cardiac  Enzymes  Recent Labs Lab 07/27/16 0341 07/27/16 0924 07/27/16 1511  TROPONINI 0.07* 0.43* 1.01*   ------------------------------------------------------------------------------------------------------------------ Invalid input(s): POCBNP ---------------------------------------------------------------------------------------------------------------  RADIOLOGY: No results found.  EKG:  Orders placed or performed during the hospital encounter of 07/26/16  . ED EKG within 10 minutes  . ED EKG within 10 minutes    ASSESSMENT AND PLAN:  Principal Problem:   Acute on chronic systolic CHF (congestive heart failure) (HCC) Active Problems:   HTN (hypertension)   HLD (hyperlipidemia)   CAD (coronary artery disease)   Diabetes (HCC)  #1. Acute respiratory failure with hypoxia and mild hypercapnia off BiPAP continue oxygen thru [place], wean off oxygen as tolerated. Patient is not on oxygen at home  Respiratory failure improving, patient on the BiPAP, transfer the patient to regular floor out of ICU.   #2. Acute on chronic systolic CHF, continue diuretics, follow ins and outs, weight, oxygenation; improving, continue IV Lasix for today. And continue to monitor urine output.  #3. COPD exacerbation due to acute bronchitis, continue patient on nebulizing therapy, inhalers, follow clinically IV steroids  #4. History of atrial fibrillation, now in sinus rhythm, continue patient on Xarelto, heart rate is well controlled  #5. CKD stage III, stable Follow with diuresis  #6. Elevated troponin, likely demand ischemia, no significant chest pains at present, follow closely, continue patient on aspirin, Lipitor, Cardizem, cardiologist input apprecviated  Management plans discussed with the patient, family and they are in agreement.   DRUG ALLERGIES:  Allergies  Allergen Reactions  . Morphine And Related Other (See Comments)    Patient states it makes her "space out"    CODE STATUS:      Code Status Orders        Start     Ordered   07/27/16 0021  Full code  Continuous     07/27/16 0020    Code Status History    Date Active Date Inactive Code Status Order ID Comments User Context   06/07/2016  9:35 PM 06/09/2016  5:15 PM Full Code [id number]  Auburn Bilberry, MD ED   05/02/2016  4:56 PM 05/03/2016  8:01 PM Full Code [id number]  Houston Siren, MD Inpatient   03/02/2016  3:34 PM 03/05/2016  4:16 PM Full Code [id number]  Schnier, Latina Craver, MD Inpatient   02/28/2016  2:43 AM 03/02/2016  3:33 PM Full Code [id number]  Oralia Manis, MD Inpatient   02/06/2016  7:17 PM 02/10/2016 11:14 PM Full Code [id number]  Hower, Cletis [place], MD ED   01/24/2016  2:00 PM 01/25/2016  7:15 PM Full Code [id number]  Auburn Bilberry, MD Inpatient   11/25/2015  3:40 PM 11/26/2015  3:49 PM Full Code [id number]  Houston Siren, MD Inpatient   11/05/2015 12:33 AM 11/06/2015  6:05 PM Full Code [id number]  Oralia Manis, MD Inpatient   09/13/2015 10:10 AM 09/15/2015  7:33 PM Full Code [id number]  Altamese Dilling, MD Inpatient     TOTAL  TIME TAKING CARE OF THIS PATIENT: 35 minutes.   Milagros Loll R M.D on 07/30/2016 at 12:46 PM  Between 7am to 6pm - Pager - [phone]  After 6pm go to [email or web address] - password [email or web address]  [facility]  Office  [phone]  CC: Primary care physician; Leanna Sato, MD

## 2016-07-30 NOTE — Progress Notes (Signed)
PT Cancellation Note  Patient Details Name: Robin [name] MRN: [id number] DOB: [birth year]   Cancelled Treatment:    Reason Eval/Treat Not Completed: Other (comment) (Evaluation re-attempted.  Primary RN requesting PT hold at this time due to excessive patient restlessness/agitation this date.  Will continue efforts next date as medically appropriate.)  Ovie Cornelio H. Manson Passey, PT, DPT, NCS 07/30/16, 4:35 PM [phone]

## 2016-07-30 NOTE — Progress Notes (Signed)
Inpatient Diabetes Program Recommendations  AACE/ADA: New Consensus Statement on Inpatient Glycemic Control (2015)  Target Ranges:  Prepandial:   less than 140 mg/dL      Peak postprandial:   less than 180 mg/dL (1-2 hours)      Critically ill patients:  140 - 180 mg/dL   Lab Results  Component Value Date   GLUCAP 277 (H) 07/30/2016   HGBA1C 7.5 (H) 07/27/2016    Review of Glycemic Control:  Results for GROVER, ZOOK (MRN [id number]) as of 07/30/2016 12:37  Ref. Range 07/29/2016 07:43 07/29/2016 12:06 07/29/2016 17:07 07/29/2016 20:50 07/30/2016 07:38 07/30/2016 12:17  Glucose-Capillary Latest Ref Range: 65 - 99 mg/dL 115 (H) 726 (H) 203 (H) 190 (H) 237 (H) 277 (H)   Diabetes history:DM2 Outpatient Diabetes medications: Glipizide XL 2.5 mg QAM Current orders for Inpatient glycemic control: Novolog 0-9 units TID with meals, Novolog 0-5 units QHS Solumedrol 40 mg IV q 12 hours  Inpatient Diabetes Program Recommendations: Insulin - Basal: If steroids are continued, please consider ordering Lantus 10 units Q24H starting now. Please note that if Lantus is ordered as requested, it will likely need to be adjusted as steroids are tapered. Insulin - Meal Coverage: While inpatient and ordered steroids, please consider ordering Novolog 3 units TID with meals for meal coverage if patient eats at least 50% of meals.   Thanks,  Beryl Meager, RN, BC-ADM Inpatient Diabetes Coordinator Pager [phone] (8a-5p)

## 2016-07-31 ENCOUNTER — Inpatient Hospital Stay: Payer: Medicare Other

## 2016-07-31 ENCOUNTER — Encounter: Payer: Self-pay | Admitting: Internal Medicine

## 2016-07-31 LAB — CBC WITH DIFFERENTIAL/PLATELET
Basophils Absolute: 0 10*3/uL (ref 0–0.1)
Basophils Relative: 0 %
EOS PCT: 0 %
Eosinophils Absolute: 0 10*3/uL (ref 0–0.7)
HCT: 35.2 % (ref 35.0–47.0)
Hemoglobin: 10.9 g/dL — ABNORMAL LOW (ref 12.0–16.0)
LYMPHS ABS: 0.5 10*3/uL — AB (ref 1.0–3.6)
Lymphocytes Relative: 3 %
MCH: 23 pg — ABNORMAL LOW (ref 26.0–34.0)
MCHC: 30.8 g/dL — ABNORMAL LOW (ref 32.0–36.0)
MCV: 74.5 fL — ABNORMAL LOW (ref 80.0–100.0)
Monocytes Absolute: 0.5 10*3/uL (ref 0.2–0.9)
Monocytes Relative: 3 %
Neutro Abs: 17.3 10*3/uL — ABNORMAL HIGH (ref 1.4–6.5)
Neutrophils Relative %: 94 %
PLATELETS: 354 10*3/uL (ref 150–440)
RBC: 4.73 MIL/uL (ref 3.80–5.20)
RDW: 19.7 % — ABNORMAL HIGH (ref 11.5–14.5)
WBC: 18.3 10*3/uL — AB (ref 3.6–11.0)

## 2016-07-31 LAB — BASIC METABOLIC PANEL
Anion gap: 11 (ref 5–15)
BUN: 46 mg/dL — AB (ref 6–20)
CHLORIDE: 99 mmol/L — AB (ref 101–111)
CO2: 31 mmol/L (ref 22–32)
Calcium: 9.2 mg/dL (ref 8.9–10.3)
Creatinine, Ser: 1.44 mg/dL — ABNORMAL HIGH (ref 0.44–1.00)
GFR calc Af Amer: 40 mL/min — ABNORMAL LOW (ref >60)
GFR, EST NON AFRICAN AMERICAN: 35 mL/min — AB (ref >60)
Glucose, Bld: 354 mg/dL — ABNORMAL HIGH (ref 65–99)
POTASSIUM: 3.9 mmol/L (ref 3.5–5.1)
Sodium: 141 mmol/L (ref 135–145)

## 2016-07-31 LAB — GLUCOSE, CAPILLARY
Glucose-Capillary: 388 mg/dL — ABNORMAL HIGH (ref 65–99)
Glucose-Capillary: 303 mg/dL — ABNORMAL HIGH (ref 65–99)
Glucose-Capillary: 223 mg/dL — ABNORMAL HIGH (ref 65–99)
Glucose-Capillary: 255 mg/dL — ABNORMAL HIGH (ref 65–99)

## 2016-07-31 MED ORDER — LORAZEPAM 0.5 MG PO TABS
0.5000 mg | ORAL_TABLET | Freq: Two times a day (BID) | ORAL | Status: DC | PRN
  Administered 2016-07-31 – 2016-08-01 (×2): 0.5 mg via ORAL
  Filled 2016-07-31 (×2): qty 1

## 2016-07-31 MED ORDER — INSULIN GLARGINE 100 UNIT/ML ~~LOC~~ SOLN
10.0000 [IU] | Freq: Once | SUBCUTANEOUS | Status: AC
  Administered 2016-07-31: 10 [IU] via SUBCUTANEOUS
  Filled 2016-07-31: qty 0.1

## 2016-07-31 MED ORDER — PREDNISONE 50 MG PO TABS
50.0000 mg | ORAL_TABLET | Freq: Every day | ORAL | Status: DC
  Administered 2016-07-31: 50 mg via ORAL
  Filled 2016-07-31: qty 1

## 2016-07-31 MED ORDER — APIXABAN 5 MG PO TABS
5.0000 mg | ORAL_TABLET | Freq: Two times a day (BID) | ORAL | Status: DC
  Administered 2016-07-31 – 2016-08-03 (×6): 5 mg via ORAL
  Filled 2016-07-31 (×6): qty 1

## 2016-07-31 MED ORDER — FUROSEMIDE 10 MG/ML IJ SOLN
40.0000 mg | Freq: Two times a day (BID) | INTRAMUSCULAR | Status: DC
  Administered 2016-07-31: 40 mg via INTRAVENOUS
  Filled 2016-07-31 (×2): qty 4

## 2016-07-31 MED ORDER — LORAZEPAM 1 MG PO TABS
1.0000 mg | ORAL_TABLET | Freq: Two times a day (BID) | ORAL | Status: DC | PRN
Start: 2016-07-31 — End: 2016-07-31

## 2016-07-31 NOTE — Care Management (Addendum)
Physical therapy has not been abel to evaluate patient due to multiple reasons- agitation, confusion, fatigue.  Patient has had 12 ED visits and 5 admissions in last 6 months.  She has on previous admission been evaluated by psych and deemed to have capacity to choose which meds she wants to take.  Home health interventions and involvement continue to have little effect on  improving management of care issues in the home setting.  Have reached out to patient's daughter to discuss

## 2016-07-31 NOTE — Progress Notes (Signed)
[facility] - [place] at [facility]   PATIENT NAME: Robin [name]    MR#:  [id number]  DATE OF BIRTH:  [birth year]  CHIEF COMPLAINT:   Chief Complaint  Patient presents with  . Shortness of Breath    Patient is sitting in a chair today. States she is not feeling well. Some nausea.  . Review of Systems  Constitutional: Negative for chills, fever and weight loss.  HENT: Negative for congestion.   Eyes: Negative for blurred vision and double vision.  Respiratory: Positive for cough. Negative for sputum production, shortness of breath and wheezing.   Cardiovascular: Negative for chest pain, palpitations, orthopnea, leg swelling and PND.  Gastrointestinal: Negative for abdominal pain, blood in stool, constipation, diarrhea, nausea and vomiting.  Genitourinary: Negative for dysuria, frequency, hematuria and urgency.  Musculoskeletal: Negative for falls.  Neurological: Negative for dizziness, tremors, focal weakness and headaches.  Endo/Heme/Allergies: Does not bruise/bleed easily.  Psychiatric/Behavioral: Negative for depression. The patient does not have insomnia.     VITAL SIGNS: Blood pressure (!) 182/74, pulse 90, temperature 97.5 F (36.4 C), temperature source Oral, resp. rate 18, height 5\' 4"  (1.626 m), weight 59.1 kg (130 lb 6.4 oz), SpO2 97 %.  PHYSICAL EXAMINATION:   GENERAL:  76 y.o.-year-old patient lying in the bed in mild to moderate respiratory distress, intermittently tachypneic.  EYES: Pupils equal, round, reactive to light and accommodation. No scleral icterus. Extraocular muscles intact.  HEENT: Head atraumatic, normocephalic. Oropharynx and nasopharynx clear.  NECK:  Supple, no jugular venous distention. No thyroid enlargement, no tenderness.  LUNGS: Diminished breath sounds bilaterally, scattered diffuse wheezing, few bilateral rales,rhonchi , crepitations at bases, especially on the left. Intermittent use of accessory muscles of  respiration.  CARDIOVASCULAR: S1, S2 . Rhythm was regular. 2/6 systolic murmur precordially, no rubs, or gallops.  ABDOMEN: Soft, nontender, nondistended. Bowel sounds present. No organomegaly or mass.  EXTREMITIES: 2+ lower extremity and pedal edema bilaterally, no cyanosis, or clubbing.  NEUROLOGIC: Cranial nerves II through XII are intact. Muscle strength 5/5 in all extremities. Sensation intact.  PSYCHIATRIC: The patient is alert and awake. SKIN: No obvious rash, lesion, or ulcer.   ORDERS/RESULTS REVIEWED:   CBC  Recent Labs Lab 07/26/16 2056 07/27/16 0341 07/28/16 0442 07/30/16 0751 07/31/16 1050  WBC 12.6* 19.9* 24.1* 19.2* 18.3*  HGB 10.0* 9.8* 8.9* 10.3* 10.9*  HCT 31.8* 31.9* 29.0* 33.7* 35.2  PLT 316 321 270 306 354  MCV 74.9* 75.7* 75.0* 75.9* 74.5*  MCH 23.5* 23.2* 23.0* 23.3* 23.0*  MCHC 31.4* 30.6* 30.6* 30.7* 30.8*  RDW 19.5* 20.0* 19.5* 20.1* 19.7*  LYMPHSABS  --   --   --  0.6* 0.5*  MONOABS  --   --   --  0.4 0.5  EOSABS  --   --   --  0.0 0.0  BASOSABS  --   --   --  0.0 0.0   ------------------------------------------------------------------------------------------------------------------  Chemistries   Recent Labs Lab 07/26/16 2056 07/27/16 0341 07/28/16 0442 07/30/16 0751 07/31/16 1050  NA 139 139 140 143 141  K 3.6 4.2 4.1 3.3* 3.9  CL 105 105 103 103 99*  CO2 23 22 28 28 31   GLUCOSE 178* 366* 180* 237* 354*  BUN 18 19 37* 41* 46*  CREATININE 1.16* 1.32* 1.49* 1.40* 1.44*  CALCIUM 9.1 8.8* 8.4* 8.6* 9.2  MG  --   --   --  1.9  --   AST  --   --   --  13*  --   ALT  --   --   --  15  --   ALKPHOS  --   --   --  96  --   BILITOT  --   --   --  0.9  --    ------------------------------------------------------------------------------------------------------------------ estimated creatinine clearance is 29.1 mL/min (A) (by C-G formula based on SCr of 1.44 mg/dL  (H)). ------------------------------------------------------------------------------------------------------------------ No results for input(s): TSH, T4TOTAL, T3FREE, THYROIDAB in the last 72 hours.  Invalid input(s): FREET3  Cardiac Enzymes  Recent Labs Lab 07/27/16 0341 07/27/16 0924 07/27/16 1511  TROPONINI 0.07* 0.43* 1.01*   ------------------------------------------------------------------------------------------------------------------ Invalid input(s): POCBNP ---------------------------------------------------------------------------------------------------------------  RADIOLOGY: No results found.  EKG:  Orders placed or performed during the hospital encounter of 07/26/16  . ED EKG within 10 minutes  . ED EKG within 10 minutes    ASSESSMENT AND PLAN:  Principal Problem:   Acute on chronic systolic CHF (congestive heart failure) (HCC) Active Problems:   HTN (hypertension)   HLD (hyperlipidemia)   CAD (coronary artery disease)   Diabetes (HCC)  #1. Acute respiratory failure with hypoxia and mild hypercapnia off BiPAP continue oxygen thru [place], wean off oxygen as tolerated. Patient is not on oxygen at home  #2. Acute on chronic systolic CHF Slowly improving. Will increase dose of Lasix today. Monitor input and output. Repeat BMP tomorrow.  #3. COPD exacerbation due to acute bronchitis, continue patient on nebulizing therapy, inhalers, follow clinically IV steroids  #4. History of atrial fibrillation, now in sinus rhythm, continue patient on Eliquis, heart rate is well controlled On Cardizem and metoprolol  #5. CKD stage III, stable Follow with diuresis  #6. Elevated troponin, likely demand ischemia, no significant chest pains at present, follow closely, continue patient on aspirin, Lipitor, Cardizem, cardiologist input appreciated  Physical therapy to ambulate patient  Call Dr. Misty Stanley and left a voicemail.  Likely discharge tomorrow.  DRUG ALLERGIES:   Allergies  Allergen Reactions  . Morphine And Related Other (See Comments)    Patient states it makes her "space out"   CODE STATUS:     Code Status Orders        Start     Ordered   07/27/16 0021  Full code  Continuous     07/27/16 0020    Code Status History    Date Active Date Inactive Code Status Order ID Comments User Context   06/07/2016  9:35 PM 06/09/2016  5:15 PM Full Code [id number]  Auburn Bilberry, MD ED   05/02/2016  4:56 PM 05/03/2016  8:01 PM Full Code [id number]  Houston Siren, MD Inpatient   03/02/2016  3:34 PM 03/05/2016  4:16 PM Full Code [id number]  Schnier, Latina Craver, MD Inpatient   02/28/2016  2:43 AM 03/02/2016  3:33 PM Full Code [id number]  Oralia Manis, MD Inpatient   02/06/2016  7:17 PM 02/10/2016 11:14 PM Full Code [id number]  Hower, Cletis [place], MD ED   01/24/2016  2:00 PM 01/25/2016  7:15 PM Full Code [id number]  Auburn Bilberry, MD Inpatient   11/25/2015  3:40 PM 11/26/2015  3:49 PM Full Code [id number]  Houston Siren, MD Inpatient   11/05/2015 12:33 AM 11/06/2015  6:05 PM Full Code [id number]  Oralia Manis, MD Inpatient   09/13/2015 10:10 AM 09/15/2015  7:33 PM Full Code [id number]  Altamese Dilling, MD Inpatient     TOTAL  TIME TAKING CARE OF THIS PATIENT: 35 minutes.   Milagros Loll R M.D on 07/31/2016 at 2:05  PM  Between 7am to 6pm - Pager - [phone]  After 6pm go to [email or web address] - password [email or web address] [facility]  [facility] [place] [facility]  Office  [phone]  CC: Primary care physician; Leanna Sato, MD

## 2016-07-31 NOTE — Evaluation (Signed)
Physical Therapy Evaluation Patient Details Name: Robin [name] MRN: [id number] DOB: [birth year] Today's Date: 07/31/2016   History of Present Illness  presented to ER secondary to SOB; admitted with acute respiratory failure related to CHF, COPD exacerbation.  Mild elevation in troponin noted, likely demand ischema per cards.  Clinical Impression  Patient generally confused with limited ability to consistently follow simple, one-step commands this date.  Requiring hand-over-hand assist for initiation of all tasks.  Very anxious with situation and mobility efforts; fearful of falling.  Appears to have difficulty focusing on therapist; question current visual abilities (though patient with difficulty following attempts at assessment).  Generally weak and deconditioned throughout all extremities requiring mod assist for all bed mobility, sit/stand and LE stepping (forward/backward x1) with RW.  Posterior lean throughout with absent attempts at spontaneous correction.  Very high fall risk; requiring +1 assist at all times. Would benefit from skilled PT to address above deficits and promote optimal return to PLOF; recommend transition to STR upon discharge from acute hospitalization.     Follow Up Recommendations SNF    Equipment Recommendations       Recommendations for Other Services       Precautions / Restrictions Precautions Precautions: Fall Restrictions Weight Bearing Restrictions: No      Mobility  Bed Mobility Overal bed mobility: Needs Assistance Bed Mobility: Supine to Sit;Sit to Supine     Supine to sit: Mod assist Sit to supine: Mod assist      Transfers Overall transfer level: Needs assistance Equipment used: Rolling walker (2 wheeled) Transfers: Sit to/from Stand Sit to Stand: Mod assist         General transfer comment: extensive assist for forward weight shift, lift off and standing balance  Ambulation/Gait Ambulation/Gait assistance: Mod  assist Ambulation Distance (Feet): 1 Feet Assistive device: Rolling walker (2 wheeled)       General Gait Details: single step forward/backward with R LE, patient refused further attempts due to fear of falling.  Dep assist from therapist for weight shift and task initiation  Stairs            Wheelchair Mobility    Modified Rankin (Stroke Patients Only)       Balance Overall balance assessment: Needs assistance Sitting-balance support: No upper extremity supported;Feet supported Sitting balance-Leahy Scale: Poor   Postural control: Posterior lean Standing balance support: Bilateral upper extremity supported Standing balance-Leahy Scale: Poor                               Pertinent Vitals/Pain Pain Assessment: Faces Faces Pain Scale: Hurts little more Pain Location: soreness in belly Pain Intervention(s): Monitored during session;Repositioned;Limited activity within patient's tolerance    Home Living Family/patient expects to be discharged to:: Unsure Living Arrangements: Children (per previous notes)               Additional Comments: patient unable to provide accurate history at this time; will verify with family as available.  Per previous notes, lives with daughter in single-story apartment with 3 steps to enter.    Prior Function           Comments: patient unable to provide accurate history at this time; will verify with family as available     Hand Dominance        Extremity/Trunk Assessment   Upper Extremity Assessment Upper Extremity Assessment: Generalized weakness    Lower Extremity Assessment Lower Extremity Assessment: Generalized weakness (  grossly at least 3/5 throughout; difficulty with formal MMT)       Communication   Communication: No difficulties  Cognition Arousal/Alertness: Awake/alert Behavior During Therapy: Flat affect;Anxious Overall Cognitive Status: No family/caregiver present to determine baseline  cognitive functioning                                 General Comments: oriented to self and location; inconsistently follows simple, one-step commands with increased time and hand-over-hand assist.  Very fearful of movement and falling.  question full visual abilities.      General Comments      Exercises Other Exercises Other Exercises: Unsupported sitting edge of bed, worked on midline orientation and forward weight shift, min/mod assist.  Hand-over-hand throughout; absent spontaneous righting reacitons.   Assessment/Plan    PT Assessment Patient needs continued PT services  PT Problem List Decreased strength;Decreased activity tolerance;Decreased balance;Decreased mobility;Decreased cognition;Decreased knowledge of use of DME;Decreased safety awareness;Decreased knowledge of precautions;Cardiopulmonary status limiting activity       PT Treatment Interventions DME instruction;Gait training;Stair training;Functional mobility training;Therapeutic activities;Therapeutic exercise;Balance training;Patient/family education    PT Goals (Current goals can be found in the Care Plan section)  Acute Rehab PT Goals PT Goal Formulation: Patient unable to participate in goal setting Time For Goal Achievement: 08/14/16 Potential to Achieve Goals: Fair    Frequency Min 2X/week   Barriers to discharge Decreased caregiver support      Co-evaluation               AM-PAC PT "6 Clicks" Daily Activity  Outcome Measure Difficulty turning over in bed (including adjusting bedclothes, sheets and blankets)?: Total Difficulty moving from lying on back to sitting on the side of the bed? : Total Difficulty sitting down on and standing up from a chair with arms (e.g., wheelchair, bedside commode, etc,.)?: Total Help needed moving to and from a bed to chair (including a wheelchair)?: A Lot Help needed walking in hospital room?: A Lot Help needed climbing 3-5 steps with a railing? :  A Lot 6 Click Score: 9    End of Session Equipment Utilized During Treatment: Gait belt Activity Tolerance: Patient limited by fatigue Patient left: in bed;with call bell/phone within reach;with bed alarm set Nurse Communication: Mobility status PT Visit Diagnosis: Muscle weakness (generalized) (M62.81);Unsteadiness on feet (R26.81)    Time: 1610-9604 PT Time Calculation (min) (ACUTE ONLY): 16 min   Charges:   PT Evaluation $PT Eval Low Complexity: 1 Procedure PT Treatments $Therapeutic Activity: 8-22 mins   PT G Codes:        Cristyn Crossno H. Manson Passey, PT, DPT, NCS 07/31/16, 5:23 PM [phone]

## 2016-08-01 DIAGNOSIS — F0390 Unspecified dementia without behavioral disturbance: Secondary | ICD-10-CM

## 2016-08-01 DIAGNOSIS — Z515 Encounter for palliative care: Secondary | ICD-10-CM

## 2016-08-01 DIAGNOSIS — Z7189 Other specified counseling: Secondary | ICD-10-CM

## 2016-08-01 LAB — GLUCOSE, CAPILLARY
GLUCOSE-CAPILLARY: 315 mg/dL — AB (ref 65–99)
GLUCOSE-CAPILLARY: 179 mg/dL — AB (ref 65–99)
Glucose-Capillary: 219 mg/dL — ABNORMAL HIGH (ref 65–99)
Glucose-Capillary: 330 mg/dL — ABNORMAL HIGH (ref 65–99)

## 2016-08-01 MED ORDER — TAMSULOSIN HCL 0.4 MG PO CAPS
0.4000 mg | ORAL_CAPSULE | Freq: Every day | ORAL | Status: DC
  Administered 2016-08-01 – 2016-08-03 (×3): 0.4 mg via ORAL
  Filled 2016-08-01 (×3): qty 1

## 2016-08-01 MED ORDER — INSULIN GLARGINE 100 UNIT/ML ~~LOC~~ SOLN: 10.0000 [IU] | Freq: Once | SUBCUTANEOUS | Status: DC

## 2016-08-01 MED ORDER — PREDNISONE 50 MG PO TABS
50.0000 mg | ORAL_TABLET | Freq: Every day | ORAL | Status: AC
  Administered 2016-08-01: 50 mg via ORAL
  Filled 2016-08-01: qty 1

## 2016-08-01 MED ORDER — FUROSEMIDE 10 MG/ML IJ SOLN
80.0000 mg | Freq: Two times a day (BID) | INTRAMUSCULAR | Status: DC
  Administered 2016-08-01 – 2016-08-02 (×3): 80 mg via INTRAVENOUS
  Filled 2016-08-01 (×3): qty 8

## 2016-08-01 MED ORDER — INSULIN GLARGINE 100 UNIT/ML ~~LOC~~ SOLN
16.0000 [IU] | Freq: Once | SUBCUTANEOUS | Status: AC
  Administered 2016-08-01: 16 [IU] via SUBCUTANEOUS
  Filled 2016-08-01: qty 0.16

## 2016-08-01 MED ORDER — HYDRALAZINE HCL 25 MG PO TABS
25.0000 mg | ORAL_TABLET | Freq: Three times a day (TID) | ORAL | Status: DC
  Administered 2016-08-01 – 2016-08-03 (×7): 25 mg via ORAL
  Filled 2016-08-01 (×7): qty 1

## 2016-08-01 NOTE — Care Management (Signed)
CM spoke directly with patient.  She is trying to eat her pudding with a straw saying it is a spoon.  Says she has cataracts and is suppose to have seen the doctor about it today. She had difficulty  seeing the number of fingers CM is holding up.  Says she lives with her daughter in [place].  She is frequently calling out from her room and at times she is not able to state that she is in the hospital. Staff is reporting significantly confusion during this admission which is significantly below her baseline 0on previous admissions.  She can not verbalize how she receives her medications at [facility] other than.  She is not able to stand on her own.  CM informed that patient's daughter works during the day and in patient present state, she should not be left alone.  Attempted to discuss skilled nursing placement with patient and says " I don't know.  Spoke with attending about again assessing patient capacity; Also discussed palliative care consult for treatment goals/ freq admissions/code status. Attending anticipates discharge either to snf or home within next 24 hours.  Updated CSW.  If patient returns home, may need to involve Adult Protective Services

## 2016-08-01 NOTE — Care Management (Signed)
Spoke with Malachy Chamber from [facility] regarding ltac criteria.  At present, patient meets the criteria but this could change within the next 24 hours and patient not meet criteria. Informed Misty Stanley of this level of care option and she will discuss disposition with her sisters.  Discussed the concerns over multiple admissions, ability to provide closer supervision of patient medical regimen. Stressed that CM concerns are in no way meaning family members have done anything wrong. Discussed the need to establish whether patient has capacity to participate in her medical decisions.   Misty Stanley will discuss potential discharge disposition with her sisters and follow up with CM.

## 2016-08-01 NOTE — Consult Note (Signed)
Consultation Note Date: 08/01/2016   Patient Name: Robin [name]  DOB: 06-Jun-1940  MRN: [id number]  Age / Sex: 76 y.o., female  PCP: Marguerita Merles, MD Referring Physician: Hillary Bow, MD  Reason for Consultation: Establishing goals of care  HPI/Patient Profile: 76 y.o. female  with past medical history of CHF, hypertension, hyperlipidemia, diabetes mellitus, CAD, CABG asthma, anxiety, and afib admitted on 07/26/2016 with shortness of breath. Multiple hospitalizations and ED visits for CHF exacerbations. In ED, xray reveals small pleural effusions and mild vascular congestion. BNP is 2500. ECHO reveals EF of 15%. Placed on BiPAP and given lasix. Elevated troponin likely demand ischemia. Cardiology following. Palliative medicine consultation for goals of care.      Clinical Assessment and Goals of Care: I have reviewed medical records, discussed with care team, and met with patient at bedside to discuss diagnosis, GOC, EOL wishes, disposition and options.  Introduced Palliative Medicine as specialized medical care for people living with serious illness. It focuses on providing relief from the symptoms and stress of a serious illness. The goal is to improve quality of life for both the patient and the family.  Upon arrival to room, patient is crying and states "I don't want to go blind." She is most concerned about her cataracts. She also complains of back pain. Notified RN to give prn tylenol or tramadol for pain.   Patient tells me she lives with her daughter and son. She is able to ambulate with a cane. She tells me she needs assistance with ADL's. She has had heart failure for "a long time."   Discussed diagnoses and interventions. Educated on the disease trajectory of CHF. Patient is intermittently confused and does not seem to understand this is a chronic, progressive disease. Educated on frequent  re-hospitalizations for CHF exacerbations indicate disease progression.    Advanced directives, concepts specific to code status, and artifical feeding and hydration were considered and discussed. Patient completed an advanced directive packet in December 2017. Reviewed in EPIC. Patient does not remember completing this. When asked questions regarding code status she states "I don't know" and "I have never thought about it." Educated on medical recommendation for DNR/DNI with age and chronic co-morbidities such as heart failure.    I attempted to elicit values and goals of care important to the patient. Staying at home with family is important.  She becomes very tearful and states " I don't want to die." Provided emotional support. Knowing she has a chronic, progressive condition, I encouraged her to start thinking of her wishes regarding code status and EOL as her heart failure continues to progress.      Palliative Care services outpatient were explained and offered.  **1330, 1505-Attempted to discuss goals of care with daughter, Lattie Haw x2. She is at work and unavailable to talk at this time. Will f/u in AM.    [place] code. Educated on medical recommendation for DNR/DNI with age and chronic co-morbidities.   Encouraged patient to consider and  discuss EOL wishes with family as disease continues to progress.   Advanced directive packet reviewed in EPIC. Patient has a documented HCPOA from 02/2016. Joaquim Lai Floyd and Satilla Graumann-they live in [place] and [place]).   May benefit from palliative services to follow at home on discharge.   Code Status/Advance Care Planning:  Full code   Symptom Management:   Tylenol or tramadol as needed for back pain  Palliative Prophylaxis:   Aspiration, Delirium Protocol, Frequent Pain Assessment, Oral Care and Turn Reposition  Additional Recommendations (Limitations, Scope, Preferences):  Full Scope  Treatment  Psycho-social/Spiritual:   Desire for further Chaplaincy support: no  Additional Recommendations: Caregiving  Support/Resources  Prognosis:   Unable to determine  Discharge Planning: Home with Home Health      Primary Diagnoses: Present on Admission: . Acute on chronic systolic CHF (congestive heart failure) ([place]) . CAD (coronary artery disease) . HLD (hyperlipidemia) . HTN (hypertension)   I have reviewed the medical record, interviewed the patient and family, and examined the patient. The following aspects are pertinent.  Past Medical History:  Diagnosis Date  . Afib ([place])   . Anxiety   . Asthma   . CHF (congestive heart failure) ([place])   . Coronary artery disease   . Diabetes mellitus without complication ([place])   . Hyperlipidemia   . Hypertension    Social History   Social History  . Marital status: Divorced    Spouse name: N/A  . Number of children: N/A  . Years of education: N/A   Social History Main Topics  . Smoking status: Never Smoker  . Smokeless tobacco: Never Used  . Alcohol use No  . Drug use: No  . Sexual activity: Not Asked   Other Topics Concern  . None   Social History Narrative  . None   Family History  Problem Relation Age of Onset  . Hypertension Mother   . Heart failure Mother   . Heart disease Father    Scheduled Meds: . apixaban  5 mg Oral BID  . aspirin  81 mg Oral Daily  . atorvastatin  80 mg Oral q1800  . budesonide (PULMICORT) nebulizer solution  0.5 mg Nebulization BID  . diltiazem  180 mg Oral Daily  . feeding supplement (GLUCERNA SHAKE)  237 mL Oral TID BM  . furosemide  80 mg Intravenous BID  . hydrALAZINE  25 mg Oral Q8H  . insulin aspart  0-15 Units Subcutaneous TID WC  . insulin aspart  0-5 Units Subcutaneous QHS  . ipratropium-albuterol  3 mL Nebulization TID  . levofloxacin  250 mg Oral Daily  . lisinopril  40 mg Oral Daily  . metoprolol tartrate  100 mg Oral BID  . nitroGLYCERIN  1 inch  Topical Q6H  . pantoprazole  40 mg Oral Daily   Continuous Infusions: PRN Meds:.acetaminophen **OR** acetaminophen, famotidine, ipratropium-albuterol, loperamide, LORazepam, ondansetron **OR** ondansetron (ZOFRAN) IV, traMADol Medications Prior to Admission:  Prior to Admission medications   Medication Sig Start Date End Date Taking? Authorizing Provider  albuterol (ACCUNEB) 1.25 MG/3ML nebulizer solution Take 1.25 mg by nebulization every 4 (four) hours as needed for wheezing or shortness of breath.    Yes [provider]  amiodarone (PACERONE) 400 MG tablet Take 400 mg by mouth daily. 07/20/16  Yes [provider]  apixaban (ELIQUIS) 5 MG TABS tablet Take 5 mg by mouth 2 (two) times daily. 07/04/16 07/03/17 Yes [provider]  aspirin 81 MG chewable tablet Chew 81 mg  by mouth daily. 04/13/16  Yes [provider]  famotidine (PEPCID) 20 MG tablet Take 20 mg by mouth 2 (two) times daily as needed. 07/03/16 08/02/16 Yes [provider]  hydrALAZINE (APRESOLINE) 25 MG tablet Take 75 mg by mouth 3 (three) times daily.   Yes [provider]  lisinopril (PRINIVIL,ZESTRIL) 40 MG tablet Take 40 mg by mouth daily. 07/03/16 08/02/16 Yes [provider]  LORazepam (ATIVAN) 1 MG tablet Take 1 tablet (1 mg total) by mouth 2 (two) times daily. 05/05/16 05/05/17 Yes Earleen Newport, MD  meclizine (ANTIVERT) 25 MG tablet Take 1 tablet (25 mg total) by mouth 3 (three) times daily as needed for dizziness. 11/06/15  Yes Gladstone Lighter, MD  metoprolol succinate (TOPROL-XL) 100 MG 24 hr tablet Take 100 mg by mouth daily.   Yes [provider]  nitroGLYCERIN (NITROSTAT) 0.4 MG SL tablet Place 1 tablet (0.4 mg total) under the tongue every 5 (five) minutes as needed for chest pain. 01/25/16  Yes Bettey Costa, MD  ondansetron (ZOFRAN ODT) 4 MG disintegrating tablet Allow 1-2 tablets to dissolve in your mouth every 8 hours as needed for nausea/vomiting  06/14/16  Yes Hinda Kehr, MD  spironolactone (ALDACTONE) 25 MG tablet Take 0.5 tablets by mouth daily. 07/20/16  Yes [provider]  traMADol (ULTRAM) 50 MG tablet Take 50 mg by mouth every 6 (six) hours as needed.   Yes [provider]  atorvastatin (LIPITOR) 80 MG tablet Take 1 tablet (80 mg total) by mouth daily at 6 PM. Patient not taking: Reported on 07/27/2016 03/05/16   Fritzi Mandes, MD  diltiazem (CARDIZEM CD) 180 MG 24 hr capsule Take 1 capsule (180 mg total) by mouth daily. Patient not taking: Reported on 07/27/2016 03/05/16   Fritzi Mandes, MD  furosemide (LASIX) 20 MG tablet Take 20 mg by mouth every morning.    [provider]  glipiZIDE (GLUCOTROL) 5 MG tablet Take 0.5 tablets (2.5 mg total) by mouth daily before breakfast. Patient not taking: Reported on 07/27/2016 03/06/16   Fritzi Mandes, MD  mometasone-formoterol [facility]) 200-5 MCG/ACT AERO Inhale 2 puffs into the lungs 2 (two) times daily. Patient not taking: Reported on 07/27/2016 03/05/16   Fritzi Mandes, MD  oxyCODONE-acetaminophen (PERCOCET/ROXICET) 5-325 MG tablet Take 1 tablet by mouth every 4 (four) hours as needed for moderate pain or severe pain. Patient not taking: Reported on 07/27/2016 03/05/16   Fritzi Mandes, MD  pantoprazole (PROTONIX) 40 MG tablet Take 1 tablet (40 mg total) by mouth daily. Patient not taking: Reported on 07/27/2016 11/06/15   Gladstone Lighter, MD  rivaroxaban (XARELTO) 20 MG TABS tablet Take 1 tablet (20 mg total) by mouth daily with supper. Patient not taking: Reported on 07/27/2016 06/13/16   Earleen Newport, MD   Allergies  Allergen Reactions  . Morphine And Related Other (See Comments)    Patient states it makes her "space out"   Review of Systems  Constitutional: Positive for activity change.  Respiratory: Positive for shortness of breath.   Cardiovascular: Positive for leg swelling.   Physical Exam  Constitutional: She is cooperative.  HENT:  Head:  Normocephalic and atraumatic.  Cardiovascular: Regular rhythm.   Pulmonary/Chest: Effort normal.  Abdominal: Normal appearance.  Neurological: She is alert.  Skin: Skin is warm and dry. There is pallor.  Psychiatric: She is withdrawn. She exhibits a depressed mood.  Nursing note and vitals reviewed.  Vital Signs: BP (!) 155/64 (BP Location: Left Arm)   Pulse 77  Temp 97.7 F (36.5 C) (Oral)   Resp 18   Ht 5' 4"  (1.626 m)   Wt 59 kg (130 lb 1.6 oz)   LMP  (LMP Unknown)   SpO2 96%   BMI 22.33 kg/m  Pain Assessment: 0-10   Pain Score: 10-Worst pain ever  SpO2: SpO2: 96 % O2 Device:SpO2: 96 % O2 Flow Rate: .O2 Flow Rate (L/min): 1 L/min  IO: Intake/output summary:   Intake/Output Summary (Last 24 hours) at 08/01/16 1310 Last data filed at 08/01/16 1107  Gross per 24 hour  Intake              360 ml  Output             1050 ml  Net             -690 ml    LBM: Last BM Date: 07/31/16 Baseline Weight: Weight: 66.2 kg (146 lb) Most recent weight: Weight: 59 kg (130 lb 1.6 oz)     Palliative Assessment/Data: PPS 50%   Flowsheet Rows     Most Recent Value  Intake Tab  Referral Department  Hospitalist  Unit at Time of Referral  Cardiac/Telemetry Unit  Palliative Care Primary Diagnosis  Cardiac  Date Notified  08/01/16  Palliative Care Type  New Palliative care  Reason for referral  Clarify Goals of Care  Date of Admission  07/26/16  Date first seen by Palliative Care  08/01/16  # of days Palliative referral response time  0 Day(s)  # of days IP prior to Palliative referral  6  Clinical Assessment  Palliative Performance Scale Score  50%  Psychosocial & Spiritual Assessment  Palliative Care Outcomes  Patient/Family meeting held?  Yes  Who was at the meeting?  patient  Palliative Care Outcomes  Clarified goals of care, ACP counseling assistance, Provided psychosocial or spiritual support, Improved pain interventions      Time In: 1200 Time Out: 1310 Time  Total: 38mn Greater than 50%  of this time was spent counseling and coordinating care related to the above assessment and plan.  Signed by:  MIhor Dow FNP-C Palliative Medicine Team  Phone: [phone]: [phone]  Please contact Palliative Medicine Team phone at [phone] questions and concerns.  For individual provider: See AShea Evans

## 2016-08-01 NOTE — [place] FL2 (Signed)
[place] MEDICAID FL2 LEVEL OF CARE SCREENING TOOL     IDENTIFICATION  Patient Name: Robin [name] Birthdate: [birth year] Sex: female Admission Date (Current Location): 07/26/2016  [place] and [place] Number:  [address] [id number] Facility and Address:  [facility], [address] [place], [place] [address]      Provider Number: [id number]  Attending Physician Name and Address:  Milagros Loll, MD  Relative Name and Phone Number:  [name],[name] Daughter [phone] or [place], [place] [phone] or Arminda Resides    [phone] or Arminda Resides    [phone] or Pegues,Marlin Friend   [phone]     Current Level of Care: Hospital Recommended Level of Care: Skilled Nursing Facility Prior Approval Number:    Date Approved/Denied: 08/01/16 PASRR Number: [id number]  Discharge Plan: SNF    Current Diagnoses: Patient Active Problem List   Diagnosis Date Noted  . Palliative care by specialist   . Goals of care, counseling/discussion   . Abdominal pain 06/07/2016  . Transaminitis   . Atrial fibrillation with RVR (HCC) 05/03/2016  . Demand ischemia (HCC) 05/03/2016  . Moderate aortic stenosis 05/03/2016  . Moderate mitral regurgitation 05/03/2016  . History of noncompliance with medical treatment 05/03/2016  . Congestive dilated cardiomyopathy (HCC) 05/03/2016  . Chest pain 05/02/2016  . 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 Renal artery stenosis (HCC) 02/28/2016  . Accelerated hypertension 02/28/2016  . Mild dementia 02/28/2016  . Noncompliance 02/07/2016  . Renal infarct (HCC) 02/06/2016  . Atrial fibrillation with rapid ventricular response (HCC) 02/06/2016  . A-fib (HCC) 01/24/2016  . HTN (hypertension) 11/04/2015  . HLD (hyperlipidemia) 11/04/2015  . CAD (coronary artery disease) 11/04/2015  . Acute on chronic systolic CHF (congestive heart failure) (HCC) 11/04/2015  . Diabetes (HCC) 11/04/2015    Orientation RESPIRATION  BLADDER Height & Weight     Self, Place  Normal Continent Weight: 130 lb 1.6 oz (59 kg) Height:  5\' 4"  (162.6 cm)  BEHAVIORAL SYMPTOMS/MOOD NEUROLOGICAL BOWEL NUTRITION STATUS      Continent Diet (Carb Modified)  AMBULATORY STATUS COMMUNICATION OF NEEDS Skin   Limited Assist Verbally Normal                       Personal Care Assistance Level of Assistance  Bathing, Feeding, Dressing Bathing Assistance: Limited assistance Feeding assistance: Independent Dressing Assistance: Limited assistance     Functional Limitations Info  Speech     Speech Info: Adequate    SPECIAL CARE FACTORS FREQUENCY  PT (By licensed PT), OT (By licensed OT)     PT Frequency: 5x a day OT Frequency: 5x a day            Contractures Contractures Info: Not present    Additional Factors Info  Allergies, Insulin Sliding Scale, Code Status Code Status Info: Full Code Allergies Info: MORPHINE AND RELATED    Insulin Sliding Scale Info: insulin aspart (novoLOG) injection 0-15 Units 3x a day       Current Medications (08/01/2016):  This is the current hospital active medication list Current Facility-Administered Medications  Medication Dose Route Frequency Provider Last Rate Last Dose  . acetaminophen (TYLENOL) tablet 650 mg  650 mg Oral Q6H PRN Oralia Manis, MD       Or  . acetaminophen (TYLENOL) suppository 650 mg  650 mg Rectal Q6H PRN Oralia Manis, MD      . apixaban Everlene Balls) tablet 5 mg  5 mg Oral BID Milagros Loll, MD   5 mg at 08/01/16  1610  . aspirin chewable tablet 81 mg  81 mg Oral Daily Oralia Manis, MD   81 mg at 08/01/16 0959  . atorvastatin (LIPITOR) tablet 80 mg  80 mg Oral q1800 Oralia Manis, MD   80 mg at 07/31/16 1719  . budesonide (PULMICORT) nebulizer solution 0.5 mg  0.5 mg Nebulization BID Erin Fulling, MD   0.5 mg at 08/01/16 1000  . diltiazem (CARDIZEM CD) 24 hr capsule 180 mg  180 mg Oral Daily Oralia Manis, MD   180 mg at 08/01/16 0959  . famotidine (PEPCID)  tablet 20 mg  20 mg Oral BID PRN Marylou Flesher S, NP   20 mg at 07/28/16 1131  . feeding supplement (GLUCERNA SHAKE) (GLUCERNA SHAKE) liquid 237 mL  237 mL Oral TID BM Erin Fulling, MD   237 mL at 08/01/16 1301  . furosemide (LASIX) injection 80 mg  80 mg Intravenous BID Milagros Loll, MD   80 mg at 08/01/16 0816  . hydrALAZINE (APRESOLINE) tablet 25 mg  25 mg Oral Q8H Sudini, Srikar, MD   25 mg at 08/01/16 1301  . insulin aspart (novoLOG) injection 0-15 Units  0-15 Units Subcutaneous TID WC Katha Hamming, MD   5 Units at 08/01/16 1158  . insulin aspart (novoLOG) injection 0-5 Units  0-5 Units Subcutaneous QHS Oralia Manis, MD   3 Units at 07/31/16 2124  . ipratropium-albuterol (DUONEB) 0.5-2.5 (3) MG/3ML nebulizer solution 3 mL  3 mL Nebulization Q4H PRN Oralia Manis, MD   3 mL at 07/27/16 0816  . ipratropium-albuterol (DUONEB) 0.5-2.5 (3) MG/3ML nebulizer solution 3 mL  3 mL Nebulization TID Erin Fulling, MD   3 mL at 08/01/16 1334  . levofloxacin (LEVAQUIN) tablet 250 mg  250 mg Oral Daily Milagros Loll, MD   250 mg at 08/01/16 1159  . lisinopril (PRINIVIL,ZESTRIL) tablet 40 mg  40 mg Oral Daily Marylou Flesher S, NP   40 mg at 08/01/16 0959  . loperamide (IMODIUM) capsule 2 mg  2 mg Oral PRN Katha Hamming, MD   2 mg at 07/29/16 1735  . LORazepam (ATIVAN) tablet 0.5 mg  0.5 mg Oral BID PRN Milagros Loll, MD   0.5 mg at 07/31/16 2123  . metoprolol tartrate (LOPRESSOR) tablet 100 mg  100 mg Oral BID Katharina Caper, MD   100 mg at 08/01/16 0959  . nitroGLYCERIN (NITROGLYN) 2 % ointment 1 inch  1 inch Topical Q6H Katharina Caper, MD   1 inch at 08/01/16 1159  . ondansetron (ZOFRAN) tablet 4 mg  4 mg Oral Q6H PRN Oralia Manis, MD       Or  . ondansetron [facility]) injection 4 mg  4 mg Intravenous Q6H PRN Oralia Manis, MD   4 mg at 07/31/16 0305  . pantoprazole (PROTONIX) EC tablet 40 mg  40 mg Oral Daily Oralia Manis, MD   40 mg at 08/01/16 0959  . tamsulosin (FLOMAX) capsule  0.4 mg  0.4 mg Oral Daily Sudini, Srikar, MD      . traMADol Janean Sark) tablet 50 mg  50 mg Oral Q6H PRN Lewie Loron, NP   50 mg at 08/01/16 1301     Discharge Medications: Please see discharge summary for a list of discharge medications.  Relevant Imaging Results:  Relevant Lab Results:   Additional Information SSN [id number]  [name], [place]

## 2016-08-01 NOTE — Consult Note (Signed)
[facility] Consult   Reason for Consult:  Consult for 76 year old woman with multiple medical problems and a history of dementia. Question about capacity for medical decision making Referring Physician:  Sudini Patient Identification: Jenevie Casstevens MRN:  [id number] Principal Diagnosis: Mild dementia Diagnosis:   Patient Active Problem List   Diagnosis Date Noted  . Palliative care by specialist [Z51.5]   . Goals of care, counseling/discussion [Z71.89]   . Abdominal pain [R10.9] 06/07/2016  . Transaminitis [R74.0]   . Atrial fibrillation with RVR ([place]) [I48.91] 05/03/2016  . Demand ischemia ([place]) [I24.8] 05/03/2016  . Moderate aortic stenosis [I35.0] 05/03/2016  . Moderate mitral regurgitation [I34.0] 05/03/2016  . History of noncompliance with medical treatment [Z91.19] 05/03/2016  . Congestive dilated cardiomyopathy ([place]) [I42.0] 05/03/2016  . Chest pain [R07.9] 05/02/2016  . Aortic mural thrombus ([place]) [I74.10] 02/28/2016  . Renal artery stenosis ([place]) [I70.1] 02/28/2016  . Accelerated hypertension [I10] 02/28/2016  . Mild dementia [F03.90] 02/28/2016  . Noncompliance [Z91.19] 02/07/2016  . Renal infarct ([place]) [N28.0] 02/06/2016  . Atrial fibrillation with rapid ventricular response ([place]) [I48.91] 02/06/2016  . A-fib ([place]) [I48.91] 01/24/2016  . HTN (hypertension) [I10] 11/04/2015  . HLD (hyperlipidemia) [E78.5] 11/04/2015  . CAD (coronary artery disease) [I25.10] 11/04/2015  . Acute on chronic systolic CHF (congestive heart failure) ([place]) [I50.23] 11/04/2015  . Diabetes ([place]) [E11.9] 11/04/2015    Total Time spent with patient: 1 hour  Subjective:   Ayushi Pla is a 76 y.o. female patient admitted with "my blood pressure was high".  HPI:  Patient interviewed chart reviewed. Case discussed with case management and hospitalist. This is a 76 year old woman with a history of multiple medical problems and multiple hospitalizations who presented this time  with shortness of breath. Patient's mental capacity has appeared to the treatment team to be declining. It has been recommended that she go to rehabilitation or other kind of care facility and the patient has a history of declining this. Concern raised about her ability to make reasonable decisions about her own care. On interview today the patient tells me she is here in the hospital because of her blood pressure which is not exactly true. She can't remember what symptoms she was having when she came into the hospital. She is unable to give me any kind of coherent description of her recent symptoms or medical problems. She tends to repeat herself by just saying "blood pressure and sugar diabetes". Patient is very vague and most of her descriptions. She tends to answer most questions by just saying "fine". She claims that things have been "fine" at home. Her description of her daily routine is not believable based on her current level of disability. Patient says her mood is "half and half". Doesn't offer any more description of this. She is not able to tell me any of the medicine she takes at home. She tells me that she takes insulin which even her friend in the room is able to tell me is not actually true. I posed to her the fact that the treatment team has recommended that she go to rehabilitation to try and get her strength back and improve her health and the patient told me she thought maybe that was a good idea.  Social history: Patient lives with a younger gentleman who appears to be a friend of hers who lives with her and helps to take care of her at times. She has some adult children who may be there at least part of the  time as well.  Medical history: Multiple medical problems including heart arrhythmias shortness of breath hypertension  Substance abuse history: No history of substance abuse problems  Past Psychiatric History: Patient has been seen before and has been noted to have some problems  with memory but she does seem to be getting worse. There is no history of hospitalization or suicide attempts. No clear depression. No history of psychosis.    Risk to Self: Is patient at risk for suicide?: No Risk to Others:   Prior Inpatient Therapy:   Prior Outpatient Therapy:    Past Medical History:  Past Medical History:  Diagnosis Date  . Afib ([place])   . Anxiety   . Asthma   . CHF (congestive heart failure) ([place])   . Coronary artery disease   . Diabetes mellitus without complication ([place])   . Hyperlipidemia   . Hypertension     Past Surgical History:  Procedure Laterality Date  . CORONARY ARTERY BYPASS GRAFT    . NO PAST SURGERIES    . PERIPHERAL VASCULAR CATHETERIZATION Left 03/02/2016   Procedure: Renal Intervention;  Surgeon: Katha Cabal, MD;  Location: [facility];  Service: Cardiovascular;  Laterality: Left;   Family History:  Family History  Problem Relation Age of Onset  . Hypertension Mother   . Heart failure Mother   . Heart disease Father    Family Psychiatric  History: Does not know of any Social History:  History  Alcohol Use No     History  Drug Use No    Social History   Social History  . Marital status: Divorced    Spouse name: N/A  . Number of children: N/A  . Years of education: N/A   Social History Main Topics  . Smoking status: Never Smoker  . Smokeless tobacco: Never Used  . Alcohol use No  . Drug use: No  . Sexual activity: Not Asked   Other Topics Concern  . None   Social History Narrative  . None   Additional Social History:    Allergies:   Allergies  Allergen Reactions  . Morphine And Related Other (See Comments)    Patient states it makes her "space out"    Labs:  Results for orders placed or performed during the hospital encounter of 07/26/16 (from the past 48 hour(s))  Glucose, capillary     Status: Abnormal   Collection Time: 07/30/16  8:50 PM  Result Value Ref Range   Glucose-Capillary 242  (H) 65 - 99 mg/dL  Glucose, capillary     Status: Abnormal   Collection Time: 07/31/16  7:54 AM  Result Value Ref Range   Glucose-Capillary 388 (H) 65 - 99 mg/dL  CBC with Differential/Platelet     Status: Abnormal   Collection Time: 07/31/16 10:50 AM  Result Value Ref Range   WBC 18.3 (H) 3.6 - 11.0 K/uL   RBC 4.73 3.80 - 5.20 MIL/uL   Hemoglobin 10.9 (L) 12.0 - 16.0 g/dL   HCT 35.2 35.0 - 47.0 %   MCV 74.5 (L) 80.0 - 100.0 fL   MCH 23.0 (L) 26.0 - 34.0 pg   MCHC 30.8 (L) 32.0 - 36.0 g/dL   RDW 19.7 (H) 11.5 - 14.5 %   Platelets 354 150 - 440 K/uL   Neutrophils Relative % 94 %   Neutro Abs 17.3 (H) 1.4 - 6.5 K/uL   Lymphocytes Relative 3 %   Lymphs Abs 0.5 (L) 1.0 - 3.6 K/uL  Monocytes Relative 3 %   Monocytes Absolute 0.5 0.2 - 0.9 K/uL   Eosinophils Relative 0 %   Eosinophils Absolute 0.0 0 - 0.7 K/uL   Basophils Relative 0 %   Basophils Absolute 0.0 0 - 0.1 K/uL  Basic metabolic panel     Status: Abnormal   Collection Time: 07/31/16 10:50 AM  Result Value Ref Range   Sodium 141 135 - 145 mmol/L   Potassium 3.9 3.5 - 5.1 mmol/L   Chloride 99 (L) 101 - 111 mmol/L   CO2 31 22 - 32 mmol/L   Glucose, Bld 354 (H) 65 - 99 mg/dL   BUN 46 (H) 6 - 20 mg/dL   Creatinine, Ser 1.44 (H) 0.44 - 1.00 mg/dL   Calcium 9.2 8.9 - 10.3 mg/dL   GFR calc non Af Amer 35 (L) >60 mL/min   GFR calc Af Amer 40 (L) >60 mL/min    Comment: (NOTE) The eGFR has been calculated using the CKD EPI equation. This calculation has not been validated in all clinical situations. eGFR's persistently <60 mL/min signify possible Chronic Kidney Disease.    Anion gap 11 5 - 15  Glucose, capillary     Status: Abnormal   Collection Time: 07/31/16 11:25 AM  Result Value Ref Range   Glucose-Capillary 303 (H) 65 - 99 mg/dL  Glucose, capillary     Status: Abnormal   Collection Time: 07/31/16  4:49 PM  Result Value Ref Range   Glucose-Capillary 223 (H) 65 - 99 mg/dL  Glucose, capillary     Status: Abnormal    Collection Time: 07/31/16  9:14 PM  Result Value Ref Range   Glucose-Capillary 255 (H) 65 - 99 mg/dL  Glucose, capillary     Status: Abnormal   Collection Time: 08/01/16  7:37 AM  Result Value Ref Range   Glucose-Capillary 315 (H) 65 - 99 mg/dL  Glucose, capillary     Status: Abnormal   Collection Time: 08/01/16 11:30 AM  Result Value Ref Range   Glucose-Capillary 219 (H) 65 - 99 mg/dL    Current Facility-Administered Medications  Medication Dose Route Frequency Provider Last Rate Last Dose  . acetaminophen (TYLENOL) tablet 650 mg  650 mg Oral Q6H PRN Lance Coon, MD       Or  . acetaminophen (TYLENOL) suppository 650 mg  650 mg Rectal Q6H PRN Lance Coon, MD      . apixaban Arne Cleveland) tablet 5 mg  5 mg Oral BID Hillary Bow, MD   5 mg at 08/01/16 0959  . aspirin chewable tablet 81 mg  81 mg Oral Daily Lance Coon, MD   81 mg at 08/01/16 0959  . atorvastatin (LIPITOR) tablet 80 mg  80 mg Oral q1800 Lance Coon, MD   80 mg at 07/31/16 1719  . budesonide (PULMICORT) nebulizer solution 0.5 mg  0.5 mg Nebulization BID Flora Lipps, MD   0.5 mg at 08/01/16 1000  . diltiazem (CARDIZEM CD) 24 hr capsule 180 mg  180 mg Oral Daily Lance Coon, MD   180 mg at 08/01/16 0959  . famotidine (PEPCID) tablet 20 mg  20 mg Oral BID PRN Dorene Sorrow S, NP   20 mg at 07/28/16 1131  . feeding supplement (GLUCERNA SHAKE) (GLUCERNA SHAKE) liquid 237 mL  237 mL Oral TID BM Flora Lipps, MD   237 mL at 08/01/16 1301  . furosemide (LASIX) injection 80 mg  80 mg Intravenous BID Hillary Bow, MD   80 mg at 08/01/16 0816  .  hydrALAZINE (APRESOLINE) tablet 25 mg  25 mg Oral Q8H Sudini, Srikar, MD   25 mg at 08/01/16 1301  . insulin aspart (novoLOG) injection 0-15 Units  0-15 Units Subcutaneous TID WC Katha Hamming, MD   5 Units at 08/01/16 1158  . insulin aspart (novoLOG) injection 0-5 Units  0-5 Units Subcutaneous QHS Oralia Manis, MD   3 Units at 07/31/16 2124  . ipratropium-albuterol  (DUONEB) 0.5-2.5 (3) MG/3ML nebulizer solution 3 mL  3 mL Nebulization Q4H PRN Oralia Manis, MD   3 mL at 07/27/16 0816  . ipratropium-albuterol (DUONEB) 0.5-2.5 (3) MG/3ML nebulizer solution 3 mL  3 mL Nebulization TID Erin Fulling, MD   3 mL at 08/01/16 1334  . levofloxacin (LEVAQUIN) tablet 250 mg  250 mg Oral Daily Milagros Loll, MD   250 mg at 08/01/16 1159  . lisinopril (PRINIVIL,ZESTRIL) tablet 40 mg  40 mg Oral Daily Marylou Flesher S, NP   40 mg at 08/01/16 0959  . loperamide (IMODIUM) capsule 2 mg  2 mg Oral PRN Katha Hamming, MD   2 mg at 07/29/16 1735  . LORazepam (ATIVAN) tablet 0.5 mg  0.5 mg Oral BID PRN Milagros Loll, MD   0.5 mg at 07/31/16 2123  . metoprolol tartrate (LOPRESSOR) tablet 100 mg  100 mg Oral BID Katharina Caper, MD   100 mg at 08/01/16 0959  . nitroGLYCERIN (NITROGLYN) 2 % ointment 1 inch  1 inch Topical Q6H Katharina Caper, MD   1 inch at 08/01/16 1159  . ondansetron (ZOFRAN) tablet 4 mg  4 mg Oral Q6H PRN Oralia Manis, MD       Or  . ondansetron [facility]) injection 4 mg  4 mg Intravenous Q6H PRN Oralia Manis, MD   4 mg at 07/31/16 0305  . pantoprazole (PROTONIX) EC tablet 40 mg  40 mg Oral Daily Oralia Manis, MD   40 mg at 08/01/16 0959  . tamsulosin (FLOMAX) capsule 0.4 mg  0.4 mg Oral Daily Sudini, Srikar, MD      . traMADol Janean Sark) tablet 50 mg  50 mg Oral Q6H PRN Lewie Loron, NP   50 mg at 08/01/16 1301    Musculoskeletal: Strength & Muscle Tone: decreased and atrophy Gait & Station: unable to stand Patient leans: N/A  Psychiatric Specialty Exam: Physical Exam  Nursing note and vitals reviewed. Constitutional: She appears well-developed. She appears distressed.  HENT:  Head: Normocephalic and atraumatic.  Eyes: Conjunctivae are normal. Pupils are equal, round, and reactive to light.  Neck: Normal range of motion.  Cardiovascular: Regular rhythm and normal heart sounds.   Respiratory: Effort normal.  GI: Soft.  Musculoskeletal:  Normal range of motion.  Neurological: She is alert.  Skin: Skin is warm and dry.  Psychiatric: Her affect is blunt. Her speech is delayed and tangential. She is slowed. Cognition and memory are impaired. She expresses no homicidal and no suicidal ideation. She exhibits abnormal recent memory.    Review of Systems  Constitutional: Positive for malaise/fatigue and weight loss.  HENT: Negative.   Eyes: Negative.   Respiratory: Negative.   Cardiovascular: Negative.   Gastrointestinal: Negative.   Musculoskeletal: Negative.   Skin: Negative.   Neurological: Positive for weakness.  Psychiatric/Behavioral: Positive for memory loss. Negative for depression, hallucinations, substance abuse and suicidal ideas. The patient is not nervous/anxious.     Blood pressure (!) 155/64, pulse 77, temperature 97.7 F (36.5 C), temperature source Oral, resp. rate 18, height 5\' 4"  (1.626 m), weight 59 kg (130  lb 1.6 oz), SpO2 96 %.Body mass index is 22.33 kg/m.  General Appearance: Disheveled  Eye Contact:  Minimal  Speech:  Slow  Volume:  Decreased  Mood:  Euthymic  Affect:  Constricted  Thought Process:  Disorganized  Orientation:  Other:  She does know where she is and she couldn't tell me the correct year but her understanding is not much deeper than that  Thought Content:  Rumination and Tangential  Suicidal Thoughts:  No  Homicidal Thoughts:  No  Memory:  Immediate;   Fair Recent;   Poor Remote;   Poor  Judgement:  Impaired  Insight:  Shallow  Psychomotor Activity:  Decreased  Concentration:  Concentration: Poor  Recall:  Poor  Fund of Knowledge:  Fair  Language:  Fair  Akathisia:  No  Handed:  Right  AIMS (if indicated):     Assets:  Housing Social Support  ADL's:  Impaired  Cognition:  Impaired,  Moderate  Sleep:        Treatment Plan Summary: Plan This is a 76 year old woman with dementia who appears to be getting worse in terms of her memory and her concentration. Her ability  to take care of herself safely at home is clearly impaired. Her presentation to the hospital and the amount of sickness she has is getting worse. She has a history of underestimating her on disability but things at this point are getting even worse. I do not believe that she currently has the capacity to make reasonable decisions based on her lack of understanding of her own health problems and life circumstances. I've conveyed this to care management. No specific need for any medicine. I will follow-up as needed.  Disposition: Patient does not meet criteria for psychiatric inpatient admission. Supportive therapy provided about ongoing stressors.  Alethia Berthold, MD 08/01/2016 4:23 PM

## 2016-08-01 NOTE — Progress Notes (Signed)
Patient has been voiding frequently last night and today.  She is on aggressive diuresis.  Bladder scan post void showed 259 ml residual. Dr Elpidio Anis notified. He ordered [employer].   Significant other is annoyed that we aren't using diapers  and requests that we do.

## 2016-08-01 NOTE — Clinical Social Work Note (Signed)
CSW received consult for patient possibly needing SNF placement.  CSW to complete assessment at a later time.  Ervin Knack. Hassan Rowan, MSW, Theresia Majors [phone]  08/01/2016 4:33 PM

## 2016-08-01 NOTE — Progress Notes (Addendum)
[facility] - [place] at [facility]   PATIENT NAME: Robin [name]    MR#:  [id number]  DATE OF BIRTH:  1940-07-07  CHIEF COMPLAINT:   Chief Complaint  Patient presents with  . Shortness of Breath    Patient is sitting in a chair today. States she is not feeling well. Has Eye appt tomorrow for bilateral cataracts. Poor vision. (Anupama Ancil Linsey, MD [facility] )   . Review of Systems  Constitutional: Negative for chills, fever and weight loss.  HENT: Negative for congestion.   Eyes: Negative for blurred vision and double vision.  Respiratory: Positive for cough. Negative for sputum production, shortness of breath and wheezing.   Cardiovascular: Negative for chest pain, palpitations, orthopnea, leg swelling and PND.  Gastrointestinal: Negative for abdominal pain, blood in stool, constipation, diarrhea, nausea and vomiting.  Genitourinary: Negative for dysuria, frequency, hematuria and urgency.  Musculoskeletal: Negative for falls.  Neurological: Negative for dizziness, tremors, focal weakness and headaches.  Endo/Heme/Allergies: Does not bruise/bleed easily.  Psychiatric/Behavioral: Negative for depression. The patient does not have insomnia.     VITAL SIGNS: Blood pressure (!) 157/62, pulse 86, temperature 98 F (36.7 C), temperature source Oral, resp. rate 14, height 5\' 4"  (1.626 m), weight 59 kg (130 lb 1.6 oz), SpO2 96 %.  PHYSICAL EXAMINATION:   GENERAL:  76 y.o.-year-old patient lying in the bed in mild to moderate respiratory distress, intermittently tachypneic.  EYES: Pupils equal, round, reactive to light and accommodation. No scleral icterus. Extraocular muscles intact.  HEENT: Head atraumatic, normocephalic. Oropharynx and nasopharynx clear.  NECK:  Supple, no jugular venous distention. No thyroid enlargement, no tenderness.  LUNGS: Diminished breath sounds bilaterally, scattered diffuse wheezing, few bilateral rales,rhonchi ,  crepitations at bases, especially on the left. Intermittent use of accessory muscles of respiration.  CARDIOVASCULAR: S1, S2 . Rhythm was regular. 2/6 systolic murmur precordially, no rubs, or gallops.  ABDOMEN: Soft, nontender, nondistended. Bowel sounds present. No organomegaly or mass.  EXTREMITIES: 2+ lower extremity and pedal edema bilaterally, no cyanosis, or clubbing.  NEUROLOGIC: Cranial nerves II through XII are intact. Muscle strength 5/5 in all extremities. Sensation intact.  PSYCHIATRIC: The patient is alert and awake. SKIN: No obvious rash, lesion, or ulcer.   ORDERS/RESULTS REVIEWED:   CBC  Recent Labs Lab 07/26/16 2056 07/27/16 0341 07/28/16 0442 07/30/16 0751 07/31/16 1050  WBC 12.6* 19.9* 24.1* 19.2* 18.3*  HGB 10.0* 9.8* 8.9* 10.3* 10.9*  HCT 31.8* 31.9* 29.0* 33.7* 35.2  PLT 316 321 270 306 354  MCV 74.9* 75.7* 75.0* 75.9* 74.5*  MCH 23.5* 23.2* 23.0* 23.3* 23.0*  MCHC 31.4* 30.6* 30.6* 30.7* 30.8*  RDW 19.5* 20.0* 19.5* 20.1* 19.7*  LYMPHSABS  --   --   --  0.6* 0.5*  MONOABS  --   --   --  0.4 0.5  EOSABS  --   --   --  0.0 0.0  BASOSABS  --   --   --  0.0 0.0   ------------------------------------------------------------------------------------------------------------------  Chemistries   Recent Labs Lab 07/26/16 2056 07/27/16 0341 07/28/16 0442 07/30/16 0751 07/31/16 1050  NA 139 139 140 143 141  K 3.6 4.2 4.1 3.3* 3.9  CL 105 105 103 103 99*  CO2 23 22 28 28 31   GLUCOSE 178* 366* 180* 237* 354*  BUN 18 19 37* 41* 46*  CREATININE 1.16* 1.32* 1.49* 1.40* 1.44*  CALCIUM 9.1 8.8* 8.4* 8.6* 9.2  MG  --   --   --  1.9  --   AST  --   --   --  13*  --   ALT  --   --   --  15  --   ALKPHOS  --   --   --  96  --   BILITOT  --   --   --  0.9  --    ------------------------------------------------------------------------------------------------------------------ estimated creatinine clearance is 29.1 mL/min (A) (by C-G formula based on SCr of  1.44 mg/dL (H)). ------------------------------------------------------------------------------------------------------------------ No results for input(s): TSH, T4TOTAL, T3FREE, THYROIDAB in the last 72 hours.  Invalid input(s): FREET3  Cardiac Enzymes  Recent Labs Lab 07/27/16 0341 07/27/16 0924 07/27/16 1511  TROPONINI 0.07* 0.43* 1.01*   ------------------------------------------------------------------------------------------------------------------ Invalid input(s): POCBNP ---------------------------------------------------------------------------------------------------------------  RADIOLOGY: Dg Chest 2 View  Result Date: 07/31/2016 CLINICAL DATA:  Congestive heart failure. EXAM: CHEST  2 VIEW COMPARISON:  Radiographs of Jul 26, 2016. FINDINGS: Stable cardiomegaly and central pulmonary vascular congestion is noted. Atherosclerosis of thoracic aorta is noted. No pneumothorax is noted. Stable bilateral pulmonary edema is noted. Mild bilateral pleural effusions are noted which are increased compared to prior exam. Bony thorax is unremarkable. IMPRESSION: Aortic atherosclerosis. Stable cardiomegaly and central pulmonary vascular congestion. Stable bilateral pulmonary edema is noted. Mild bilateral pleural effusions are noted which are increased compared to prior exam. Electronically Signed   By: Lupita Raider, M.D.   On: 07/31/2016 15:53    EKG:  Orders placed or performed during the hospital encounter of 07/26/16  . ED EKG within 10 minutes  . ED EKG within 10 minutes    ASSESSMENT AND PLAN:  Principal Problem:   Acute on chronic systolic CHF (congestive heart failure) (HCC) Active Problems:   HTN (hypertension)   HLD (hyperlipidemia)   CAD (coronary artery disease)   Diabetes (HCC)  #1. Acute respiratory failure with hypoxia and mild hypercapnia off BiPAP Off O2 now  #2. Acute on chronic systolic CHF  Slowly improving. Repeat CXR 07/31/2016 still showed pulm edema  and pleural effusions Increased lasix dose yesterday evening Monitor input and output. Repeat BMP tomorrow. She still has crackles on exam and needs further diuresis  #3. COPD exacerbation due to acute bronchitis, continue patient on nebulizers , inhalers, follow clinically Changed to prednisone and today is last dose.  #4. History of atrial fibrillation  continue patient on Eliquis, heart rate is well controlled On Cardizem and metoprolol. Was in NSR earlier but in Afib and rate controlled  #5. CKD stage III, stable Follow with diuresis  #6. Elevated troponin, likely demand ischemia, no significant chest pains at present, follow closely, continue patient on aspirin, Lipitor, Cardizem, cardiologist input appreciated.  #7 DM Not on home meds Hyperglycemia here due to steroids. Give lantus 16 units once today. Received 10 units yesterday. Poor appetite. Would not start meds at discharge as her blood sugars were 80-90 on admission. PCP f/u  Likely discharge tomorrow. SNF if patient agrees or Home with HH.  DRUG ALLERGIES:  Allergies  Allergen Reactions  . Morphine And Related Other (See Comments)    Patient states it makes her "space out"   CODE STATUS:     Code Status Orders        Start     Ordered   07/27/16 0021  Full code  Continuous     07/27/16 0020    Code Status History    Date Active Date Inactive Code Status Order ID Comments User Context   06/07/2016  9:35 PM  06/09/2016  5:15 PM Full Code [id number]  A[place]urn Bilberry, MD ED   05/02/2016  4:56 PM 05/03/2016  8:01 PM Full Code [id number]  Houston Siren, MD Inpatient   03/02/2016  3:34 PM 03/05/2016  4:16 PM Full Code [id number]  Schnier, Latina Craver, MD Inpatient   02/28/2016  2:43 AM 03/02/2016  3:33 PM Full Code [id number]  Oralia Manis, MD Inpatient   02/06/2016  7:17 PM 02/10/2016 11:14 PM Full Code [id number]  Hower, Cletis [place], MD ED   01/24/2016  2:00 PM 01/25/2016  7:15 PM Full Code [id number]  Auburn Bilberry, MD  Inpatient   11/25/2015  3:40 PM 11/26/2015  3:49 PM Full Code [id number]  Houston Siren, MD Inpatient   11/05/2015 12:33 AM 11/06/2015  6:05 PM Full Code [id number]  Oralia Manis, MD Inpatient   09/13/2015 10:10 AM 09/15/2015  7:33 PM Full Code [id number]  Altamese Dilling, MD Inpatient     TOTAL  TIME TAKING CARE OF THIS PATIENT: 35 minutes.   Milagros Loll R M.D on 08/01/2016 at 10:08 AM  Between 7am to 6pm - Pager - [phone]  After 6pm go to [email or web address] - password [email or web address] [facility]  [place] [facility]  Office  [phone]  CC: Primary care physician; Leanna Sato, MD

## 2016-08-01 NOTE — Progress Notes (Signed)
Inpatient Diabetes Program Recommendations  AACE/ADA: New Consensus Statement on Inpatient Glycemic Control (2015)  Target Ranges:  Prepandial:   less than 140 mg/dL      Peak postprandial:   less than 180 mg/dL (1-2 hours)      Critically ill patients:  140 - 180 mg/dL   Lab Results  Component Value Date   GLUCAP 315 (H) 08/01/2016   HGBA1C 7.5 (H) 07/27/2016    Review of Glycemic Control:  Results for Robin [name], Robin [name] (MRN [id number]) as of 08/01/2016 10:34  Ref. Range 07/31/2016 07:54 07/31/2016 11:25 07/31/2016 16:49 07/31/2016 21:14 08/01/2016 07:37  Glucose-Capillary Latest Ref Range: 65 - 99 mg/dL 360 (H) 677 (H) 034 (H) 255 (H) 315 (H)    Diabetes history: Type 2 Outpatient Diabetes medications: Glucotrol 2.5 mg daily,  Current orders for Inpatient glycemic control:  Novolog moderate tid with meals and HS, Lantus 16 units x 1 this morning Prednisone 50 mg daily  Inpatient Diabetes Program Recommendations:  Please consider adding Novolog meal coverage 3 units tid with meals while patient is on Prednisone.    Thanks, Beryl Meager, RN, BC-ADM Inpatient Diabetes Coordinator Pager [phone] (8a-5p)

## 2016-08-02 LAB — GLUCOSE, CAPILLARY
GLUCOSE-CAPILLARY: 164 mg/dL — AB (ref 65–99)
GLUCOSE-CAPILLARY: 166 mg/dL — AB (ref 65–99)
Glucose-Capillary: 201 mg/dL — ABNORMAL HIGH (ref 65–99)
Glucose-Capillary: 130 mg/dL — ABNORMAL HIGH (ref 65–99)

## 2016-08-02 LAB — CBC
HCT: 30.5 % — ABNORMAL LOW (ref 35.0–47.0)
Hemoglobin: 9.7 g/dL — ABNORMAL LOW (ref 12.0–16.0)
MCH: 23.1 pg — ABNORMAL LOW (ref 26.0–34.0)
MCHC: 31.8 g/dL — ABNORMAL LOW (ref 32.0–36.0)
MCV: 72.4 fL — ABNORMAL LOW (ref 80.0–100.0)
Platelets: 263 10*3/uL (ref 150–440)
RBC: 4.21 MIL/uL (ref 3.80–5.20)
RDW: 19.2 % — ABNORMAL HIGH (ref 11.5–14.5)
WBC: 14.5 10*3/uL — ABNORMAL HIGH (ref 3.6–11.0)

## 2016-08-02 LAB — BASIC METABOLIC PANEL WITH GFR
Anion gap: 10 (ref 5–15)
BUN: 46 mg/dL — ABNORMAL HIGH (ref 6–20)
CO2: 39 mmol/L — ABNORMAL HIGH (ref 22–32)
Calcium: 8.4 mg/dL — ABNORMAL LOW (ref 8.9–10.3)
Chloride: 90 mmol/L — ABNORMAL LOW (ref 101–111)
Creatinine, Ser: 1.27 mg/dL — ABNORMAL HIGH (ref 0.44–1.00)
GFR calc Af Amer: 47 mL/min — ABNORMAL LOW
GFR calc non Af Amer: 40 mL/min — ABNORMAL LOW
Glucose, Bld: 237 mg/dL — ABNORMAL HIGH (ref 65–99)
Potassium: 2.8 mmol/L — ABNORMAL LOW (ref 3.5–5.1)
Sodium: 139 mmol/L (ref 135–145)

## 2016-08-02 LAB — POTASSIUM: Potassium: 3.3 mmol/L — ABNORMAL LOW (ref 3.5–5.1)

## 2016-08-02 LAB — MAGNESIUM: Magnesium: 1.6 mg/dL — ABNORMAL LOW (ref 1.7–2.4)

## 2016-08-02 MED ORDER — FUROSEMIDE 20 MG PO TABS: ORAL_TABLET | ORAL | 2 refills | Status: DC

## 2016-08-02 MED ORDER — POTASSIUM CHLORIDE 10 MEQ/100ML IV SOLN
10.0000 meq | INTRAVENOUS | Status: AC
  Administered 2016-08-02 (×4): 10 meq via INTRAVENOUS
  Filled 2016-08-02 (×4): qty 100

## 2016-08-02 MED ORDER — FUROSEMIDE 40 MG PO TABS
40.0000 mg | ORAL_TABLET | Freq: Every day | ORAL | Status: DC
  Administered 2016-08-02 – 2016-08-03 (×2): 40 mg via ORAL
  Filled 2016-08-02 (×3): qty 1

## 2016-08-02 MED ORDER — FUROSEMIDE 20 MG PO TABS
20.0000 mg | ORAL_TABLET | Freq: Every evening | ORAL | Status: DC
  Administered 2016-08-02: 20 mg via ORAL
  Filled 2016-08-02: qty 1

## 2016-08-02 MED ORDER — POTASSIUM CHLORIDE CRYS ER 20 MEQ PO TBCR
40.0000 meq | EXTENDED_RELEASE_TABLET | ORAL | Status: AC
  Administered 2016-08-02 (×2): 40 meq via ORAL
  Filled 2016-08-02 (×2): qty 2

## 2016-08-02 MED ORDER — MAGNESIUM SULFATE 2 GM/50ML IV SOLN
2.0000 g | Freq: Once | INTRAVENOUS | Status: AC
  Administered 2016-08-02: 2 g via INTRAVENOUS
  Filled 2016-08-02: qty 50

## 2016-08-02 MED ORDER — GLUCERNA SHAKE PO LIQD: 237.0000 mL | Freq: Three times a day (TID) | ORAL | 0 refills | Status: AC

## 2016-08-02 NOTE — Progress Notes (Signed)
Inpatient Diabetes Program Recommendations  AACE/ADA: New Consensus Statement on Inpatient Glycemic Control (2015)  Target Ranges:  Prepandial:   less than 140 mg/dL      Peak postprandial:   less than 180 mg/dL (1-2 hours)      Critically ill patients:  140 - 180 mg/dL   Lab Results  Component Value Date   GLUCAP 201 (H) 08/02/2016   HGBA1C 7.5 (H) 07/27/2016    Review of Glycemic Control  Results for Robin [name], Robin [name] (MRN [id number]) as of 08/02/2016 11:12  Ref. Range 08/01/2016 07:37 08/01/2016 11:30 08/01/2016 17:13 08/01/2016 20:34 08/02/2016 07:49  Glucose-Capillary Latest Ref Range: 65 - 99 mg/dL 856 (H) 314 (H) 970 (H) 330 (H) 201 (H)    Diabetes history: Type 2 Outpatient Diabetes medications: Glucotrol 2.5 mg daily,   Current orders for Inpatient glycemic control: Novolog moderate 0-15 units tid with meals and Novolog 0-5 units qhs,    Inpatient Diabetes Program Recommendations: since blood sugars remain consistently elevated, consider adding Lantus 10 units qday starting now.   Susette Racer, RN, BA, MHA, CDE Diabetes Coordinator Inpatient Diabetes Program  [phone] (Team Pager) [phone] [facility] Office) 08/02/2016 11:21 AM

## 2016-08-02 NOTE — Care Management (Addendum)
Received a call from one of patient's daughters- Alice [phone]).  This CM has not spoken with this daughter.  She lives in [place].  She is planning to come to [place] during the early part of June and take patient back to [place] to live with her.  Family has made decision not to pursue ltac but skilled nursing facility instead.  Notified Minerva Areola- CSW. spoke with attending and informed patient is medically stable for discharge today.  Will have palliative to follow in facility

## 2016-08-02 NOTE — Care Management Important Message (Signed)
Important Message  Patient Details  Name: Robin [name] MRN: [id number] Date of Birth: 06/19/1940   Medicare Important Message Given:  Yes  Explained notice and discussed discharge to daughter Fulton Mole is lives in [place], [place], [place] 08/02/2016, 3:27 PM

## 2016-08-02 NOTE — Care Management (Signed)
Have reached out to patient's daughter Misty Stanley to discuss discharge options.  [facility] is reassessing case to determine if patient would stil meet criteria for ltac.  Patient is medically stable for discharge today. Psych determined yesterday that patient does not have capacity.

## 2016-08-02 NOTE — Progress Notes (Signed)
PHARMACY CONSULT NOTE - INITIAL   Pharmacy Consult for Electrolyte Replacement   Allergies  Allergen Reactions  . Morphine And Related Other (See Comments)    Patient states it makes her "space out"    Patient Measurements: Height: 5\' 4"  (162.6 cm) Weight: 124 lb 4.8 oz (56.4 kg) IBW/kg (Calculated) : 54.7  Vital Signs: Temp: 98 F (36.7 C) (05/24 0805) Temp Source: Oral (05/24 0805) BP: 153/45 (05/24 0805) Pulse Rate: 72 (05/24 0805) Intake/Output from previous day: 05/23 0701 - 05/24 0700 In: 360 [P.O.:360] Out: 1350 [Urine:1350] Intake/Output from this shift: No intake/output data recorded.  Labs:  Recent Labs  07/31/16 1050 08/02/16 0448  WBC 18.3*  --   HGB 10.9*  --   HCT 35.2  --   PLT 354  --   CREATININE 1.44* 1.27*   Estimated Creatinine Clearance: 33.1 mL/min (A) (by C-G formula based on SCr of 1.27 mg/dL (H)).   Microbiology: Recent Results (from the past 720 hour(s))  MRSA PCR Screening     Status: None   Collection Time: 07/27/16  3:18 AM  Result Value Ref Range Status   MRSA by PCR NEGATIVE NEGATIVE Final    Comment:        The GeneXpert MRSA Assay (FDA approved for NASAL specimens only), is one component of a comprehensive MRSA colonization surveillance program. It is not intended to diagnose MRSA infection nor to guide or monitor treatment for MRSA infections.     Medical History: Past Medical History:  Diagnosis Date  . Afib (HCC)   . Anxiety   . Asthma   . CHF (congestive heart failure) (HCC)   . Coronary artery disease   . Diabetes mellitus without complication (HCC)   . Hyperlipidemia   . Hypertension    Assessment: 76 yo female with acute on chronic systolic CHF. Pharmacy consulted for electrolyte monitoring and replacement.   Goal of Therapy:  K: 3.5-5 Mg: 1.9- 2.4  Plan:  5/24 0448  K+: 2.8     Mg: 1.6 Will replace with KCL x 2 doses and magnesium sulfate 2g IV x 1 dose.  Will recheck K+ at 1800 and give  additional supplementation if needed. F/U with all electrolytes with AM labs.   Gardner Candle, PharmD, BCPS Clinical Pharmacist 08/02/2016 10:33 AM

## 2016-08-02 NOTE — Therapy (Signed)
Reported by nurse, SpO2 87% on room air, she started O2 at 1 lpm, SpO2 now 92%

## 2016-08-02 NOTE — Progress Notes (Signed)
DR Allena Katz was made aware of pt having a 2.3 sec, pause , no new order continue to monitor

## 2016-08-02 NOTE — Care Management (Signed)
Have left an additional message for Misty Stanley to return call and a message for son Chavon Dush

## 2016-08-02 NOTE — Progress Notes (Signed)
[facility] -  at [facility]   PATIENT NAME: Robin [name]    MR#:  [id number]  DATE OF BIRTH:  [birth year]  . States she is not feeling well. Has Eye appt tomorrow for bilateral cataracts. Poor vision. (Anupama Ancil Linsey, MD [facility] )   . Review of Systems  Constitutional: Negative for chills, fever and weight loss.  HENT: Negative for congestion.   Eyes: Negative for blurred vision and double vision.  Respiratory: Positive for cough. Negative for sputum production, shortness of breath and wheezing.   Cardiovascular: Negative for chest pain, palpitations, orthopnea, leg swelling and PND.  Gastrointestinal: Negative for abdominal pain, blood in stool, constipation, diarrhea, nausea and vomiting.  Genitourinary: Negative for dysuria, frequency, hematuria and urgency.  Musculoskeletal: Negative for falls.  Neurological: Negative for dizziness, tremors, focal weakness and headaches.  Endo/Heme/Allergies: Does not bruise/bleed easily.  Psychiatric/Behavioral: Negative for depression. The patient does not have insomnia.     VITAL SIGNS: Blood pressure (!) 153/45, pulse 72, temperature 98 F (36.7 C), temperature source Oral, resp. rate 20, height 5\' 4"  (1.626 m), weight 56.4 kg (124 lb 4.8 oz), SpO2 92 %.  PHYSICAL EXAMINATION:   GENERAL:  76 y.o.-year-old patient lying in the bed in mild to moderate respiratory distress, intermittently tachypneic.  EYES: Pupils equal, round, reactive to light and accommodation. No scleral icterus. Extraocular muscles intact.  HEENT: Head atraumatic, normocephalic. Oropharynx and nasopharynx clear.  NECK:  Supple, no jugular venous distention. No thyroid enlargement, no tenderness.  LUNGS: Diminished breath sounds bilaterally, scattered diffuse wheezing, few bilateral rales,rhonchi , crepitations at bases, especially on the left. Intermittent use of accessory muscles of respiration.  CARDIOVASCULAR: S1,  S2 . Rhythm was regular. 2/6 systolic murmur precordially, no rubs, or gallops.  ABDOMEN: Soft, nontender, nondistended. Bowel sounds present. No organomegaly or mass.  EXTREMITIES: 2+ lower extremity and pedal edema bilaterally, no cyanosis, or clubbing.  NEUROLOGIC: Cranial nerves II through XII are intact. Muscle strength 5/5 in all extremities. Sensation intact.  PSYCHIATRIC: The patient is alert and awake. SKIN: No obvious rash, lesion, or ulcer.   ORDERS/RESULTS REVIEWED:   CBC  Recent Labs Lab 07/27/16 0341 07/28/16 0442 07/30/16 0751 07/31/16 1050 08/02/16 0448  WBC 19.9* 24.1* 19.2* 18.3* 14.5*  HGB 9.8* 8.9* 10.3* 10.9* 9.7*  HCT 31.9* 29.0* 33.7* 35.2 30.5*  PLT 321 270 306 354 263  MCV 75.7* 75.0* 75.9* 74.5* 72.4*  MCH 23.2* 23.0* 23.3* 23.0* 23.1*  MCHC 30.6* 30.6* 30.7* 30.8* 31.8*  RDW 20.0* 19.5* 20.1* 19.7* 19.2*  LYMPHSABS  --   --  0.6* 0.5*  --   MONOABS  --   --  0.4 0.5  --   EOSABS  --   --  0.0 0.0  --   BASOSABS  --   --  0.0 0.0  --    ------------------------------------------------------------------------------------------------------------------  Chemistries   Recent Labs Lab 07/27/16 0341 07/28/16 0442 07/30/16 0751 07/31/16 1050 08/02/16 0448  NA 139 140 143 141 139  K 4.2 4.1 3.3* 3.9 2.8*  CL 105 103 103 99* 90*  CO2 22 28 28 31  39*  GLUCOSE 366* 180* 237* 354* 237*  BUN 19 37* 41* 46* 46*  CREATININE 1.32* 1.49* 1.40* 1.44* 1.27*  CALCIUM 8.8* 8.4* 8.6* 9.2 8.4*  MG  --   --  1.9  --  1.6*  AST  --   --  13*  --   --   ALT  --   --  15  --   --   ALKPHOS  --   --  96  --   --   BILITOT  --   --  0.9  --   --    ------------------------------------------------------------------------------------------------------------------ estimated creatinine clearance is 33.1 mL/min (A) (by C-G formula based on SCr of 1.27 mg/dL  (H)). ------------------------------------------------------------------------------------------------------------------ No results for input(s): TSH, T4TOTAL, T3FREE, THYROIDAB in the last 72 hours.  Invalid input(s): FREET3  Cardiac Enzymes  Recent Labs Lab 07/27/16 0341 07/27/16 0924 07/27/16 1511  TROPONINI 0.07* 0.43* 1.01*   ------------------------------------------------------------------------------------------------------------------ Invalid input(s): POCBNP ---------------------------------------------------------------------------------------------------------------  RADIOLOGY: Dg Chest 2 View  Result Date: 07/31/2016 CLINICAL DATA:  Congestive heart failure. EXAM: CHEST  2 VIEW COMPARISON:  Radiographs of Jul 26, 2016. FINDINGS: Stable cardiomegaly and central pulmonary vascular congestion is noted. Atherosclerosis of thoracic aorta is noted. No pneumothorax is noted. Stable bilateral pulmonary edema is noted. Mild bilateral pleural effusions are noted which are increased compared to prior exam. Bony thorax is unremarkable. IMPRESSION: Aortic atherosclerosis. Stable cardiomegaly and central pulmonary vascular congestion. Stable bilateral pulmonary edema is noted. Mild bilateral pleural effusions are noted which are increased compared to prior exam. Electronically Signed   By: Lupita Raider, M.D.   On: 07/31/2016 15:53    EKG:  Orders placed or performed during the hospital encounter of 07/26/16  . ED EKG within 10 minutes  . ED EKG within 10 minutes    ASSESSMENT AND PLAN:   #1. Acute respiratory failure with hypoxia and mild hypercapnia off BiPAP Off O2 now  #2. Acute on chronic systolic CHF  Slowly improving. Repeat CXR 07/31/2016 still showed pulm edema and pleural effusions Increased lasix dose yesterday evening Monitor input and output. Repeat BMP shows some improvement She still has crackles on exam and needs further diuresis  #3. COPD exacerbation due  to acute bronchitis, continue patient on nebulizers , inhalers, follow clinically Changed to prednisone and completed course  #4. History of atrial fibrillation  continue patient on Eliquis, heart rate is well controlled On Cardizem and metoprolol. Was in NSR earlier but in Afib and rate controlled  #5. CKD stage III, stable Follow with diuresis  #6. Elevated troponin, likely demand ischemia, no significant chest pains at present, follow closely, continue patient on aspirin, Lipitor, Cardizem, cardiologist input appreciated.  #7 DM Not on home meds Hyperglycemia here due to steroids. Give lantus 16 units once today. Received 10 units yesterday. Poor appetite. Would not start meds at discharge as her blood sugars were 80-90 on admission. PCP f/u  #8 patient was deemed non-competent by psychiatry to make decisions for herself.  Management working for discharge planning LTAC vs rehabilitation  DRUG ALLERGIES:  Allergies  Allergen Reactions  . Morphine And Related Other (See Comments)    Patient states it makes her "space out"   CODE STATUS:     Code Status Orders        Start     Ordered   07/27/16 0021  Full code  Continuous     07/27/16 0020    Code Status History    Date Active Date Inactive Code Status Order ID Comments User Context   06/07/2016  9:35 PM 06/09/2016  5:15 PM Full Code [id number]  Auburn Bilberry, MD ED   05/02/2016  4:56 PM 05/03/2016  8:01 PM Full Code [id number]  Houston Siren, MD Inpatient   03/02/2016  3:34 PM 03/05/2016  4:16 PM Full Code [id number]  Schnier, Latina Craver, MD Inpatient  02/28/2016  2:43 AM 03/02/2016  3:33 PM Full Code [id number]  Oralia Manis, MD Inpatient   02/06/2016  7:17 PM 02/10/2016 11:14 PM Full Code [id number]  Wyatt Haste, MD ED   01/24/2016  2:00 PM 01/25/2016  7:15 PM Full Code [id number]  Auburn Bilberry, MD Inpatient   11/25/2015  3:40 PM 11/26/2015  3:49 PM Full Code [id number]  Houston Siren, MD Inpatient   11/05/2015  12:33 AM 11/06/2015  6:05 PM Full Code [id number]  Oralia Manis, MD Inpatient   09/13/2015 10:10 AM 09/15/2015  7:33 PM Full Code [id number]  Altamese Dilling, MD Inpatient     TOTAL  TIME TAKING CARE OF THIS PATIENT: 25 minutes.   Kacia Halley M.D on 08/02/2016 at 11:20 AM  Between 7am to 6pm - Pager - [phone]  After 6pm go to [email or web address] - password [email or web address] [facility]  [facility]  Office  [phone]  CC: Primary care physician; Leanna Sato, MD

## 2016-08-02 NOTE — Clinical Social Work Note (Signed)
CSW received phone call from patient's daughter Naylah Storts [phone], CSW informed her of bed offers.  Patient's daughter Fulton Mole is going to speak with other siblings, and call CSW back.  Robin [name]. Robin [name], MSW, Theresia Majors [phone]  08/02/2016 3:56 PM

## 2016-08-02 NOTE — Progress Notes (Signed)
PT Cancellation Note  Patient Details Name: Robin [name] MRN: [id number] DOB: 1940/07/21   Cancelled Treatment:    Reason Eval/Treat Not Completed: Fatigue/lethargy limiting ability to participate   Chart reviewed. Potassium noted 6.8.  Approached pt for therapy session.  General lethargy and did not open eyes for conversation and reports general fatigue.  She declined therapy at this time even at bed level.  Will continue as appropriate.    Danielle Dess 08/02/2016, 10:59 AM

## 2016-08-02 NOTE — Progress Notes (Signed)
PHARMACY CONSULT NOTE - INITIAL   Pharmacy Consult for Electrolyte Replacement   Allergies  Allergen Reactions  . Morphine And Related Other (See Comments)    Patient states it makes her "space out"    Patient Measurements: Height: 5\' 4"  (162.6 cm) Weight: 124 lb 4.8 oz (56.4 kg) IBW/kg (Calculated) : 54.7  Vital Signs: Temp: 97.8 F (36.6 C) (05/24 1949) Temp Source: Oral (05/24 1122) BP: 111/32 (05/24 1949) Pulse Rate: 74 (05/24 1949) Intake/Output from previous day: 05/23 0701 - 05/24 0700 In: 360 [P.O.:360] Out: 1350 [Urine:1350] Intake/Output from this shift: No intake/output data recorded.  Labs:  Recent Labs  07/31/16 1050 08/02/16 0448  WBC 18.3* 14.5*  HGB 10.9* 9.7*  HCT 35.2 30.5*  PLT 354 263  CREATININE 1.44* 1.27*  MG  --  1.6*   Estimated Creatinine Clearance: 33.1 mL/min (A) (by C-G formula based on SCr of 1.27 mg/dL (H)).   Microbiology: Recent Results (from the past 720 hour(s))  MRSA PCR Screening     Status: None   Collection Time: 07/27/16  3:18 AM  Result Value Ref Range Status   MRSA by PCR NEGATIVE NEGATIVE Final    Comment:        The GeneXpert MRSA Assay (FDA approved for NASAL specimens only), is one component of a comprehensive MRSA colonization surveillance program. It is not intended to diagnose MRSA infection nor to guide or monitor treatment for MRSA infections.     Medical History: Past Medical History:  Diagnosis Date  . Afib (HCC)   . Anxiety   . Asthma   . CHF (congestive heart failure) (HCC)   . Coronary artery disease   . Diabetes mellitus without complication (HCC)   . Hyperlipidemia   . Hypertension    Assessment: 76 yo female with acute on chronic systolic CHF. Pharmacy consulted for electrolyte monitoring and replacement.   Goal of Therapy:  K: 3.5-5 Mg: 1.9- 2.4  Plan:  5/24 0448  K+: 2.8     Mg: 1.6 Will replace with KCL x 2 doses and magnesium sulfate 2g IV x 1 dose.  Will recheck  K+ at 1800 and give additional supplementation if needed. F/U with all electrolytes with AM labs.   5/24:  K @ 18:00 = 3.3 Will order KCl 10 mEq IV X 4 to start on 5/24 @ 20:00.  Will recheck K on 5/25 @ 0100.   Sena Hoopingarner D Clinical Pharmacist 08/02/2016 7:51 PM

## 2016-08-02 NOTE — Discharge Summary (Signed)
[facility] [place] [facility]   PATIENT NAME: Robin [name]    MR#:  [id number]  DATE OF BIRTH:  09-03-1940  DATE OF ADMISSION:  07/26/2016 ADMITTING PHYSICIAN: Oralia Manis, MD  DATE OF DISCHARGE: 08/02/16  PRIMARY CARE PHYSICIAN: Leanna Sato, MD    ADMISSION DIAGNOSIS:  Shortness of breath [R06.02] Hypoxia [R09.02] Congestive heart failure, unspecified HF chronicity, unspecified heart failure type (HCC) [I50.9]  DISCHARGE DIAGNOSIS:  Acute on Chronic CHF systolic, NYHA IV Chronic afib  SECONDARY DIAGNOSIS:   Past Medical History:  Diagnosis Date  . Afib (HCC)   . Anxiety   . Asthma   . CHF (congestive heart failure) (HCC)   . Coronary artery disease   . Diabetes mellitus without complication (HCC)   . Hyperlipidemia   . Hypertension     HOSPITAL COURSE:   #1. Acute respiratory failure with hypoxia and mild hypercapnia off BiPAP Off O2 now  #2. Acute on chronic systolic CHF  Slowly improving. Repeat CXR 07/31/2016 still showed pulm edema and pleural effusions Increased lasix dose yesterday evening---change to lasix 40 mg qam and 2 mg q 5 pm . Repeat BMP shows some improvement Good diuresis, weight down to 124lbs  #3. COPD exacerbation due to acute bronchitis, continue patient on nebulizers , inhalers, follow clinically Changed to prednisone and completed course  #4. History of atrial fibrillation  continue patient on Eliquis, heart rate is well controlled On Cardizem and metoprolol. Was in NSR earlier but in Afib and rate controlled  #5. CKD stage III, stable Follow with diuresis  #6. Elevated troponin, likely demand ischemia, no significant chest pains at present, follow closely, continue patient on aspirin, Lipitor, Cardizem, cardiologist input appreciated.  #7 DM Not on home meds Hyperglycemia here due to steroids. Give lantus 16 units once today. Received 10 units yesterday. Poor appetite. Would not start  meds at discharge as her blood sugars were 80-90 on admission. PCP f/u  #8 patient was deemed non-competent by psychiatry to make decisions for herself.  D/w CM and CSW who sopke with dter's and pt will d/c to rehab CONSULTS OBTAINED:  Treatment Team:  Lamar Blinks, MD Katha Hamming, MD Clapacs, Jackquline Denmark, MD  DRUG ALLERGIES:   Allergies  Allergen Reactions  . Morphine And Related Other (See Comments)    Patient states it makes her "space out"    DISCHARGE MEDICATIONS:   Current Discharge Medication List    START taking these medications   Details  feeding supplement, GLUCERNA SHAKE, (GLUCERNA SHAKE) LIQD Take 237 mLs by mouth 3 (three) times daily between meals. Qty: 30 Can, Refills: 0      CONTINUE these medications which have CHANGED   Details  furosemide (LASIX) 20 MG tablet 49 mg in the morning and 20 mg in the vening Qty: 30 tablet, Refills: 2      CONTINUE these medications which have NOT CHANGED   Details  albuterol (ACCUNEB) 1.25 MG/3ML nebulizer solution Take 1.25 mg by nebulization every 4 (four) hours as needed for wheezing or shortness of breath.     amiodarone (PACERONE) 400 MG tablet Take 400 mg by mouth daily.    apixaban (ELIQUIS) 5 MG TABS tablet Take 5 mg by mouth 2 (two) times daily.    aspirin 81 MG chewable tablet Chew 81 mg by mouth daily.    famotidine (PEPCID) 20 MG tablet Take 20 mg by mouth 2 (two) times daily as needed.    hydrALAZINE (APRESOLINE)  25 MG tablet Take 75 mg by mouth 3 (three) times daily.    lisinopril (PRINIVIL,ZESTRIL) 40 MG tablet Take 40 mg by mouth daily.    LORazepam (ATIVAN) 1 MG tablet Take 1 tablet (1 mg total) by mouth 2 (two) times daily. Qty: 20 tablet, Refills: 0    meclizine (ANTIVERT) 25 MG tablet Take 1 tablet (25 mg total) by mouth 3 (three) times daily as needed for dizziness. Qty: 30 tablet, Refills: 0    metoprolol succinate (TOPROL-XL) 100 MG 24 hr tablet Take 100 mg by mouth daily.     nitroGLYCERIN (NITROSTAT) 0.4 MG SL tablet Place 1 tablet (0.4 mg total) under the tongue every 5 (five) minutes as needed for chest pain. Qty: 30 tablet, Refills: 0    ondansetron (ZOFRAN ODT) 4 MG disintegrating tablet Allow 1-2 tablets to dissolve in your mouth every 8 hours as needed for nausea/vomiting Qty: 30 tablet, Refills: 0    spironolactone (ALDACTONE) 25 MG tablet Take 0.5 tablets by mouth daily.    traMADol (ULTRAM) 50 MG tablet Take 50 mg by mouth every 6 (six) hours as needed.    atorvastatin (LIPITOR) 80 MG tablet Take 1 tablet (80 mg total) by mouth daily at 6 PM. Qty: 30 tablet, Refills: 1    diltiazem (CARDIZEM CD) 180 MG 24 hr capsule Take 1 capsule (180 mg total) by mouth daily. Qty: 60 capsule, Refills: 0    glipiZIDE (GLUCOTROL) 5 MG tablet Take 0.5 tablets (2.5 mg total) by mouth daily before breakfast. Qty: 30 tablet, Refills: 1    mometasone-formoterol (DULERA) 200-5 MCG/ACT AERO Inhale 2 puffs into the lungs 2 (two) times daily. Qty: 1 Inhaler, Refills: 0    pantoprazole (PROTONIX) 40 MG tablet Take 1 tablet (40 mg total) by mouth daily. Qty: 30 tablet, Refills: 2      STOP taking these medications     oxyCODONE-acetaminophen (PERCOCET/ROXICET) 5-325 MG tablet      rivaroxaban (XARELTO) 20 MG TABS tablet      metoprolol (LOPRESSOR) 100 MG tablet         If you experience worsening of your admission symptoms, develop shortness of breath, life threatening emergency, suicidal or homicidal thoughts you must seek medical attention immediately by calling 911 or calling your MD immediately  if symptoms less severe.  You Must read complete instructions/literature along with all the possible adverse reactions/side effects for all the Medicines you take and that have been prescribed to you. Take any new Medicines after you have completely understood and accept all the possible adverse reactions/side effects.   Please note  You were cared for by a  hospitalist during your hospital stay. If you have any questions about your discharge medications or the care you received while you were in the hospital after you are discharged, you can call the unit and asked to speak with the hospitalist on call if the hospitalist that took care of you is not available. Once you are discharged, your primary care physician will handle any further medical issues. Please note that NO REFILLS for any discharge medications will be authorized once you are discharged, as it is imperative that you return to your primary care physician (or establish a relationship with a primary care physician if you do not have one) for your aftercare needs so that they can reassess your need for medications and monitor your lab values. T  CBC   Recent Labs Lab 08/02/16 0448  WBC 14.5*  HGB 9.7*  HCT  30.5*  PLT 263    Chemistries   Recent Labs Lab 07/30/16 0751  08/02/16 0448  NA 143  < > 139  K 3.3*  < > 2.8*  CL 103  < > 90*  CO2 28  < > 39*  GLUCOSE 237*  < > 237*  BUN 41*  < > 46*  CREATININE 1.40*  < > 1.27*  CALCIUM 8.6*  < > 8.4*  MG 1.9  --  1.6*  AST 13*  --   --   ALT 15  --   --   ALKPHOS 96  --   --   BILITOT 0.9  --   --   < > = values in this interval not displayed.  Microbiology Results   Recent Results (from the past 240 hour(s))  MRSA PCR Screening     Status: None   Collection Time: 07/27/16  3:18 AM  Result Value Ref Range Status   MRSA by PCR NEGATIVE NEGATIVE Final    Comment:        The GeneXpert MRSA Assay (FDA approved for NASAL specimens only), is one component of a comprehensive MRSA colonization surveillance program. It is not intended to diagnose MRSA infection nor to guide or monitor treatment for MRSA infections.     RADIOLOGY:  No results found.   Management plans discussed with the patient, family and they are in agreement.  CODE STATUS:     Code Status Orders        Start     Ordered   07/27/16 0021   Full code  Continuous     07/27/16 0020    Code Status History    Date Active Date Inactive Code Status Order ID Comments User Context   06/07/2016  9:35 PM 06/09/2016  5:15 PM Full Code [id number]  Auburn Bilberry, MD ED   05/02/2016  4:56 PM 05/03/2016  8:01 PM Full Code [id number]  Houston Siren, MD Inpatient   03/02/2016  3:34 PM 03/05/2016  4:16 PM Full Code [id number]  Schnier, Latina Craver, MD Inpatient   02/28/2016  2:43 AM 03/02/2016  3:33 PM Full Code [id number]  Oralia Manis, MD Inpatient   02/06/2016  7:17 PM 02/10/2016 11:14 PM Full Code [id number]  Wyatt Haste, MD ED   01/24/2016  2:00 PM 01/25/2016  7:15 PM Full Code [id number]  Auburn Bilberry, MD Inpatient   11/25/2015  3:40 PM 11/26/2015  3:49 PM Full Code [id number]  Houston Siren, MD Inpatient   11/05/2015 12:33 AM 11/06/2015  6:05 PM Full Code [id number]  Oralia Manis, MD Inpatient   09/13/2015 10:10 AM 09/15/2015  7:33 PM Full Code [id number]  Altamese Dilling, MD Inpatient      TOTAL TIME TAKING CARE OF THIS PATIENT: *40* minutes.    Anzel Kearse M.D on 08/02/2016 at 4:20 PM  Between 7am to 6pm - Pager - [phone] After 6pm go to [email or web address] - Social research officer, government  [facility]  Office  [phone]  CC: Primary care physician; Leanna Sato, MD

## 2016-08-02 NOTE — Clinical Social Work Note (Signed)
CSW received phone call from patient's daughter Merryl Hacker [phone] who said she has not talked with her siblings yet about if they want to go to [facility] or SNF.  CSW presented bed offers to her, and explained to her that a decision needs to be made today so patient's discharge plan can be determined for SNF or LTACH.  Patient's daughter expressed understanding and said she will talk to everyone before 5pm and let CSW know.  Patient's daughter also said that she will be at the hospital today to see patient.  CSW continuing to follow patient's progress throughout discharge planning.  Ervin Knack. Trip Cavanagh, MSW, Theresia Majors [phone]  08/02/2016 2:12 PM

## 2016-08-02 NOTE — Clinical Social Work Note (Signed)
CSW presented bed offers to patient's daughter Robin [name], [phone], and they chose [facility], however bed is not available till tomorrow.  CSW updated bedside nurse, CSW to facilitate discharge planning tomorrow.  Ervin Knack. Perrie Ragin, MSW, Theresia Majors [phone]  08/02/2016 5:28 PM

## 2016-08-02 NOTE — Clinical Social Work Note (Signed)
CSW attempted to contact patient's daughter Misty Stanley and patient's son Fayrene Fearing in regards to discharge planning.  Case manager spoke to patient's daughter yesterday in regards to [facility] placement, and she was going to discuss with family and call back.  Case manager has not heard back from patient's family yet.  CSW left message for both family members as well awaiting for call back, CSW to continue to follow patient's progress throughout discharge planning.  Ervin Knack. Anastasiya Gowin, MSW, Theresia Majors [phone]  08/02/2016 1:32 PM

## 2016-08-03 LAB — BASIC METABOLIC PANEL
ANION GAP: 6 (ref 5–15)
BUN: 39 mg/dL — ABNORMAL HIGH (ref 6–20)
CALCIUM: 8.6 mg/dL — AB (ref 8.9–10.3)
CHLORIDE: 91 mmol/L — AB (ref 101–111)
CO2: 42 mmol/L — AB (ref 22–32)
Creatinine, Ser: 1.23 mg/dL — ABNORMAL HIGH (ref 0.44–1.00)
GFR calc non Af Amer: 42 mL/min — ABNORMAL LOW (ref >60)
GFR, EST AFRICAN AMERICAN: 48 mL/min — AB (ref >60)
Glucose, Bld: 191 mg/dL — ABNORMAL HIGH (ref 65–99)
POTASSIUM: 3.2 mmol/L — AB (ref 3.5–5.1)
Sodium: 139 mmol/L (ref 135–145)

## 2016-08-03 LAB — GLUCOSE, CAPILLARY
GLUCOSE-CAPILLARY: 204 mg/dL — AB (ref 65–99)
Glucose-Capillary: 148 mg/dL — ABNORMAL HIGH (ref 65–99)

## 2016-08-03 LAB — POTASSIUM: POTASSIUM: 3.3 mmol/L — AB (ref 3.5–5.1)

## 2016-08-03 LAB — MAGNESIUM: Magnesium: 2.1 mg/dL (ref 1.7–2.4)

## 2016-08-03 MED ORDER — POTASSIUM CHLORIDE CRYS ER 20 MEQ PO TBCR: 20.0000 meq | EXTENDED_RELEASE_TABLET | Freq: Every day | ORAL | 0 refills | Status: DC

## 2016-08-03 MED ORDER — FUROSEMIDE 20 MG PO TABS: ORAL_TABLET | ORAL | 2 refills | Status: DC

## 2016-08-03 MED ORDER — POTASSIUM CHLORIDE 10 MEQ/100ML IV SOLN
10.0000 meq | INTRAVENOUS | Status: DC
  Filled 2016-08-03 (×4): qty 100

## 2016-08-03 MED ORDER — POTASSIUM CHLORIDE CRYS ER 20 MEQ PO TBCR
40.0000 meq | EXTENDED_RELEASE_TABLET | Freq: Once | ORAL | Status: AC
  Administered 2016-08-03: 40 meq via ORAL
  Filled 2016-08-03: qty 2

## 2016-08-03 MED ORDER — POTASSIUM CHLORIDE 10 MEQ/100ML IV SOLN
10.0000 meq | INTRAVENOUS | Status: DC
  Administered 2016-08-03 (×2): 10 meq via INTRAVENOUS
  Filled 2016-08-03 (×4): qty 100

## 2016-08-03 MED ORDER — POTASSIUM CHLORIDE CRYS ER 20 MEQ PO TBCR
20.0000 meq | EXTENDED_RELEASE_TABLET | Freq: Every day | ORAL | Status: DC
  Administered 2016-08-03: 20 meq via ORAL
  Filled 2016-08-03: qty 1

## 2016-08-03 MED ORDER — POTASSIUM CHLORIDE 20 MEQ PO PACK
40.0000 meq | PACK | Freq: Once | ORAL | Status: DC
  Filled 2016-08-03: qty 2

## 2016-08-03 MED ORDER — LISINOPRIL 10 MG PO TABS: 10.0000 mg | ORAL_TABLET | Freq: Every day | ORAL | 0 refills | Status: DC

## 2016-08-03 NOTE — Progress Notes (Signed)
Patient complaining of pain at IV site due to potassium infusion. Rate slowed to half anf then half again, patient still complaining of pain. Nottfied MD Allena Katz; order for IV potassium discontinued and order placed for potassium packets 40 mEq for one dose. Nursing staff will continue to monitor for any changes in patient status. Lamonte Richer, RN

## 2016-08-03 NOTE — Progress Notes (Addendum)
PHARMACY CONSULT NOTE - INITIAL   Pharmacy Consult for Electrolyte Replacement   Allergies  Allergen Reactions  . Morphine And Related Other (See Comments)    Patient states it makes her "space out"    Patient Measurements: Height: 5\' 4"  (162.6 cm) Weight: 124 lb 4.8 oz (56.4 kg) IBW/kg (Calculated) : 54.7  Vital Signs: Temp: 97.8 F (36.6 C) (05/24 1949) BP: 136/45 (05/24 2209) Pulse Rate: 72 (05/24 2209) Intake/Output from previous day: 05/24 0701 - 05/25 0700 In: 1120 [P.O.:720; IV Piggyback:400] Out: -  Intake/Output from this shift: Total I/O In: 400 [IV Piggyback:400] Out: -   Labs:  Recent Labs  07/31/16 1050 08/02/16 0448 08/03/16 0056  WBC 18.3* 14.5*  --   HGB 10.9* 9.7*  --   HCT 35.2 30.5*  --   PLT 354 263  --   CREATININE 1.44* 1.27* 1.23*  MG  --  1.6* 2.1   Estimated Creatinine Clearance: 34.1 mL/min (A) (by C-G formula based on SCr of 1.23 mg/dL (H)).   Microbiology: Recent Results (from the past 720 hour(s))  MRSA PCR Screening     Status: None   Collection Time: 07/27/16  3:18 AM  Result Value Ref Range Status   MRSA by PCR NEGATIVE NEGATIVE Final    Comment:        The GeneXpert MRSA Assay (FDA approved for NASAL specimens only), is one component of a comprehensive MRSA colonization surveillance program. It is not intended to diagnose MRSA infection nor to guide or monitor treatment for MRSA infections.     Medical History: Past Medical History:  Diagnosis Date  . Afib (HCC)   . Anxiety   . Asthma   . CHF (congestive heart failure) (HCC)   . Coronary artery disease   . Diabetes mellitus without complication (HCC)   . Hyperlipidemia   . Hypertension    Assessment: 76 yo female with acute on chronic systolic CHF. Pharmacy consulted for electrolyte monitoring and replacement.   Goal of Therapy:  K: 3.5-5 Mg: 1.9- 2.4  Plan:  5/24 0448  K+: 2.8     Mg: 1.6 Will replace with KCL x 2 doses and magnesium sulfate  2g IV x 1 dose.  Will recheck K+ at 1800 and give additional supplementation if needed. F/U with all electrolytes with AM labs.   5/24:  K @ 18:00 = 3.3 Will order KCl 10 mEq IV X 4 to start on 5/24 @ 20:00.  Will recheck K on 5/25 @ 0100.   5/25 @ 0056 K 3.3. Will supplement w/ KCI 10 mEq IV x 4 to increase K+ to 3.7. Will recheck BMP @ 1000.Marland Kitchen  Thank you for this consult.  Thomasene Ripple Clinical Pharmacist 08/03/2016 4:15 AM

## 2016-08-03 NOTE — Progress Notes (Signed)
Report called to [facility], EMS called for transportation. Patient currently in room, no acute distress at this time. Will continue to monitor until EMS arrives to transport patient.

## 2016-08-03 NOTE — Clinical Social Work Note (Addendum)
CSW received consult that patient needs to be followed at [facility] SNF by palliative.  CSW contacted [facility] [place] to make referral.  CSW to facilitate discharge planning for today.  Patient to be d/c'ed today to [facility] SNF with palliative to follow.  Patient and family agreeable to plans will transport via ems RN to call report to [phone].  CSW left message for patient's daughter Fulton Mole [phone] to inform her that patient will be discharging today.  Ervin Knack. Lawrance Wiedemann, MSW, Theresia Majors [phone]  08/03/2016 9:23 AM

## 2016-08-03 NOTE — Discharge Summary (Addendum)
[facility] [place] [facility]   PATIENT NAME: Robin [name]    MR#:  [id number]  DATE OF BIRTH:  1940/09/13  DATE OF ADMISSION:  07/26/2016 ADMITTING PHYSICIAN: Oralia Manis, MD  DATE OF DISCHARGE: 08/03/16  PRIMARY CARE PHYSICIAN: Leanna Sato, MD    ADMISSION DIAGNOSIS:  Shortness of breath [R06.02] Hypoxia [R09.02] Congestive heart failure, unspecified HF chronicity, unspecified heart failure type (HCC) [I50.9]  DISCHARGE DIAGNOSIS:  Acute on Chronic CHF systolic, NYHA IV Chronic afib hypokalemia SECONDARY DIAGNOSIS:   Past Medical History:  Diagnosis Date  . Afib (HCC)   . Anxiety   . Asthma   . CHF (congestive heart failure) (HCC)   . Coronary artery disease   . Diabetes mellitus without complication (HCC)   . Hyperlipidemia   . Hypertension     HOSPITAL COURSE:   #1. Acute respiratory failure with hypoxia and mild hypercapnia off BiPAP Off O2 now  #2. Acute on chronic systolic CHF  At Baseline Repeat CXR 07/31/2016 still showed pulm edema and pleural effusions Increased lasix dose yesterday evening---change to lasix 40 mg qam and 2 mg q 5 pm Good diuresis, weight down to 124lbs  #3. COPD exacerbation due to acute bronchitis, continue patient on nebulizers , inhalers, follow clinically Changed to prednisone and completed course  #4. History of atrial fibrillation  continue patient on Eliquis, heart rate is well controlled On Cardizem and metoprolol.  #5. CKD stage III, stable resumed lisinopril at 10 mg  #6. Elevated troponin, likely demand ischemia, no significant chest pains at present - continue patient on aspirin, Lipitor, Cardizem -cardiologist input appreciated.  #7 DM Not on home meds Hyperglycemia here due to steroids. Give lantus 16 units once today. Received 10 units yesterday. Would not start meds at discharge as her blood sugars were 80-90 on admission. PCP f/u  #8 patient was deemed  non-competent by psychiatry to make decisions for herself.  D/w CM and CSW who sopke with dter's and pt will d/c to rehab today  Palliative care to f/u at SNF CONSULTS OBTAINED:  Treatment Team:  Lamar Blinks, MD Katha Hamming, MD Clapacs, Jackquline Denmark, MD  DRUG ALLERGIES:   Allergies  Allergen Reactions  . Morphine And Related Other (See Comments)    Patient states it makes her "space out"    DISCHARGE MEDICATIONS:   Current Discharge Medication List    START taking these medications   Details  feeding supplement, GLUCERNA SHAKE, (GLUCERNA SHAKE) LIQD Take 237 mLs by mouth 3 (three) times daily between meals. Qty: 30 Can, Refills: 0      CONTINUE these medications which have CHANGED   Details  furosemide (LASIX) 20 MG tablet 49 mg in the morning and 20 mg in the vening Qty: 30 tablet, Refills: 2    lisinopril (PRINIVIL,ZESTRIL) 10 MG tablet Take 1 tablet (10 mg total) by mouth daily. Qty: 30 tablet, Refills: 0      CONTINUE these medications which have NOT CHANGED   Details  albuterol (ACCUNEB) 1.25 MG/3ML nebulizer solution Take 1.25 mg by nebulization every 4 (four) hours as needed for wheezing or shortness of breath.     amiodarone (PACERONE) 400 MG tablet Take 400 mg by mouth daily.    apixaban (ELIQUIS) 5 MG TABS tablet Take 5 mg by mouth 2 (two) times daily.    aspirin 81 MG chewable tablet Chew 81 mg by mouth daily.    famotidine (PEPCID) 20 MG tablet Take 20 mg  by mouth 2 (two) times daily as needed.    hydrALAZINE (APRESOLINE) 25 MG tablet Take 75 mg by mouth 3 (three) times daily.    LORazepam (ATIVAN) 1 MG tablet Take 1 tablet (1 mg total) by mouth 2 (two) times daily. Qty: 20 tablet, Refills: 0    meclizine (ANTIVERT) 25 MG tablet Take 1 tablet (25 mg total) by mouth 3 (three) times daily as needed for dizziness. Qty: 30 tablet, Refills: 0    metoprolol succinate (TOPROL-XL) 100 MG 24 hr tablet Take 100 mg by mouth daily.     nitroGLYCERIN (NITROSTAT) 0.4 MG SL tablet Place 1 tablet (0.4 mg total) under the tongue every 5 (five) minutes as needed for chest pain. Qty: 30 tablet, Refills: 0    ondansetron (ZOFRAN ODT) 4 MG disintegrating tablet Allow 1-2 tablets to dissolve in your mouth every 8 hours as needed for nausea/vomiting Qty: 30 tablet, Refills: 0    spironolactone (ALDACTONE) 25 MG tablet Take 0.5 tablets by mouth daily.    traMADol (ULTRAM) 50 MG tablet Take 50 mg by mouth every 6 (six) hours as needed.    atorvastatin (LIPITOR) 80 MG tablet Take 1 tablet (80 mg total) by mouth daily at 6 PM. Qty: 30 tablet, Refills: 1    diltiazem (CARDIZEM CD) 180 MG 24 hr capsule Take 1 capsule (180 mg total) by mouth daily. Qty: 60 capsule, Refills: 0    glipiZIDE (GLUCOTROL) 5 MG tablet Take 0.5 tablets (2.5 mg total) by mouth daily before breakfast. Qty: 30 tablet, Refills: 1    mometasone-formoterol (DULERA) 200-5 MCG/ACT AERO Inhale 2 puffs into the lungs 2 (two) times daily. Qty: 1 Inhaler, Refills: 0    pantoprazole (PROTONIX) 40 MG tablet Take 1 tablet (40 mg total) by mouth daily. Qty: 30 tablet, Refills: 2      STOP taking these medications     oxyCODONE-acetaminophen (PERCOCET/ROXICET) 5-325 MG tablet      rivaroxaban (XARELTO) 20 MG TABS tablet      metoprolol (LOPRESSOR) 100 MG tablet         If you experience worsening of your admission symptoms, develop shortness of breath, life threatening emergency, suicidal or homicidal thoughts you must seek medical attention immediately by calling 911 or calling your MD immediately  if symptoms less severe.  You Must read complete instructions/literature along with all the possible adverse reactions/side effects for all the Medicines you take and that have been prescribed to you. Take any new Medicines after you have completely understood and accept all the possible adverse reactions/side effects.   Please note  You were cared for by a  hospitalist during your hospital stay. If you have any questions about your discharge medications or the care you received while you were in the hospital after you are discharged, you can call the unit and asked to speak with the hospitalist on call if the hospitalist that took care of you is not available. Once you are discharged, your primary care physician will handle any further medical issues. Please note that NO REFILLS for any discharge medications will be authorized once you are discharged, as it is imperative that you return to your primary care physician (or establish a relationship with a primary care physician if you do not have one) for your aftercare needs so that they can reassess your need for medications and monitor your lab values.   CBC   Recent Labs Lab 08/02/16 0448  WBC 14.5*  HGB 9.7*  HCT 30.5*  PLT 263    Chemistries   Recent Labs Lab 07/30/16 0751  08/03/16 0056  NA 143  < > 139  K 3.3*  < > 3.3*  3.2*  CL 103  < > 91*  CO2 28  < > 42*  GLUCOSE 237*  < > 191*  BUN 41*  < > 39*  CREATININE 1.40*  < > 1.23*  CALCIUM 8.6*  < > 8.6*  MG 1.9  < > 2.1  AST 13*  --   --   ALT 15  --   --   ALKPHOS 96  --   --   BILITOT 0.9  --   --   < > = values in this interval not displayed.  Microbiology Results   Recent Results (from the past 240 hour(s))  MRSA PCR Screening     Status: None   Collection Time: 07/27/16  3:18 AM  Result Value Ref Range Status   MRSA by PCR NEGATIVE NEGATIVE Final    Comment:        The GeneXpert MRSA Assay (FDA approved for NASAL specimens only), is one component of a comprehensive MRSA colonization surveillance program. It is not intended to diagnose MRSA infection nor to guide or monitor treatment for MRSA infections.     RADIOLOGY:  No results found.   Management plans discussed with the patient, family and they are in agreement.  CODE STATUS:     Code Status Orders        Start     Ordered   07/27/16 0021   Full code  Continuous     07/27/16 0020    Code Status History    Date Active Date Inactive Code Status Order ID Comments User Context   06/07/2016  9:35 PM 06/09/2016  5:15 PM Full Code [id number]  Auburn Bilberry, MD ED   05/02/2016  4:56 PM 05/03/2016  8:01 PM Full Code [id number]  Houston Siren, MD Inpatient   03/02/2016  3:34 PM 03/05/2016  4:16 PM Full Code [id number]  Schnier, Latina Craver, MD Inpatient   02/28/2016  2:43 AM 03/02/2016  3:33 PM Full Code [id number]  Oralia Manis, MD Inpatient   02/06/2016  7:17 PM 02/10/2016 11:14 PM Full Code [id number]  Wyatt Haste, MD ED   01/24/2016  2:00 PM 01/25/2016  7:15 PM Full Code [id number]  Auburn Bilberry, MD Inpatient   11/25/2015  3:40 PM 11/26/2015  3:49 PM Full Code [id number]  Houston Siren, MD Inpatient   11/05/2015 12:33 AM 11/06/2015  6:05 PM Full Code [id number]  Oralia Manis, MD Inpatient   09/13/2015 10:10 AM 09/15/2015  7:33 PM Full Code [id number]  Altamese Dilling, MD Inpatient      TOTAL TIME TAKING CARE OF THIS PATIENT: *40* minutes.    Masaji Billups M.D on 08/03/2016 at 10:06 AM  Between 7am to 6pm - Pager - [phone] After 6pm go to [email or web address] - Social research officer, government  [facility]  Office  [phone]  CC: Primary care physician; Leanna Sato, MD

## 2016-08-08 ENCOUNTER — Ambulatory Visit: Payer: Medicare Other | Admitting: Family

## 2016-08-13 ENCOUNTER — Ambulatory Visit: Payer: Medicare Other | Admitting: Family

## 2016-10-01 IMAGING — US US EXTREM  UP VENOUS*L*
1 series · 13 of 24 positions shown · non-contrast
Comparison: None.

CLINICAL DATA: Left arm pain for 8 hours.



[Series 1: us extrem up venous*left* · 0.05mm/px · 13 of 60 slices shown]
[im 1/60]
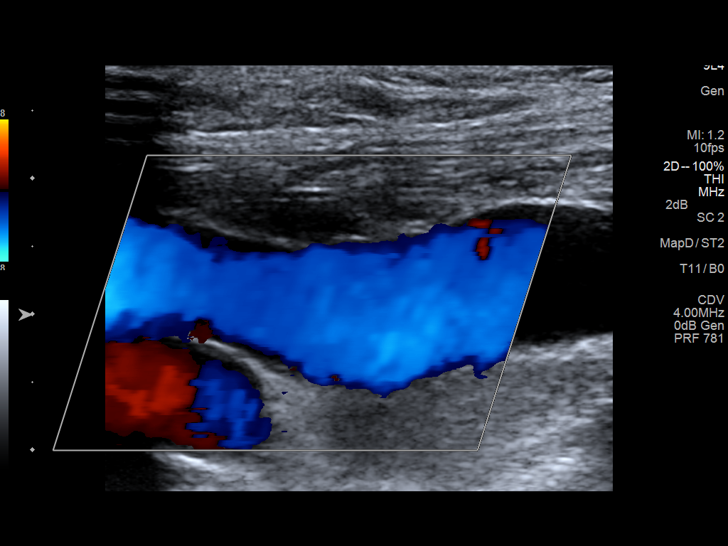
[im 6/60]
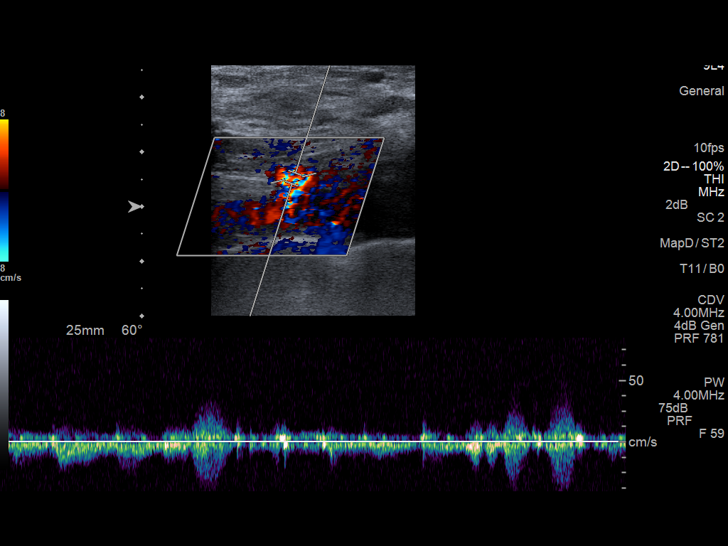
[im 11/60]
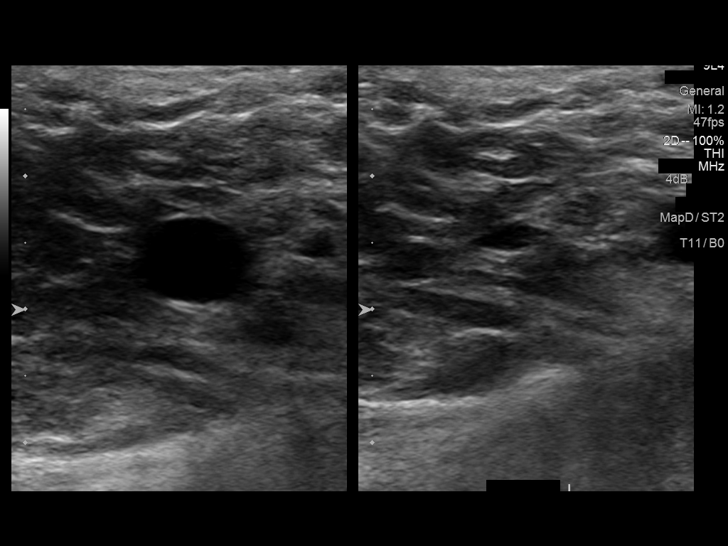
[im 16/60]
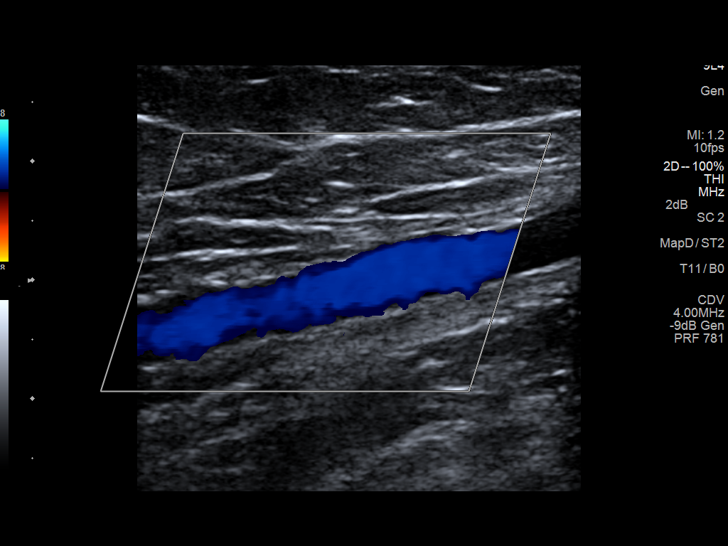
[im 21/60]
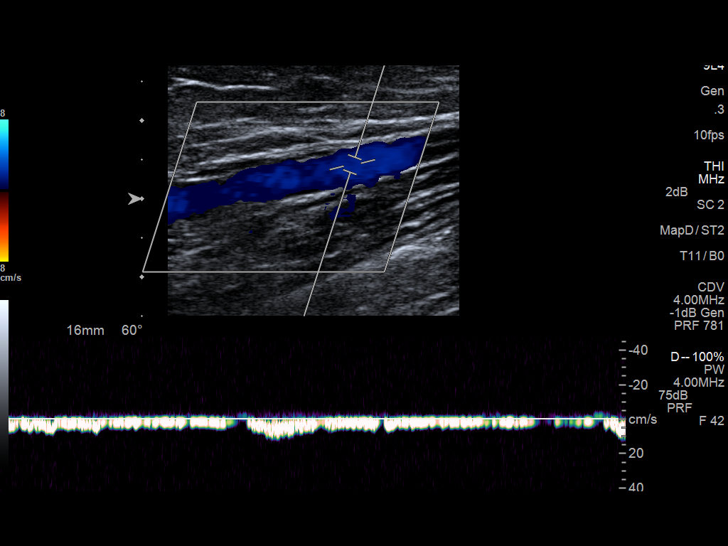
[im 26/60]
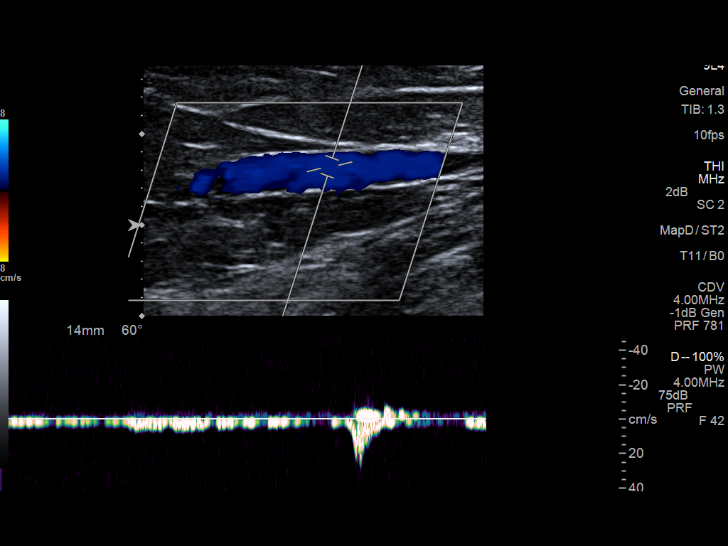
[im 31/60]
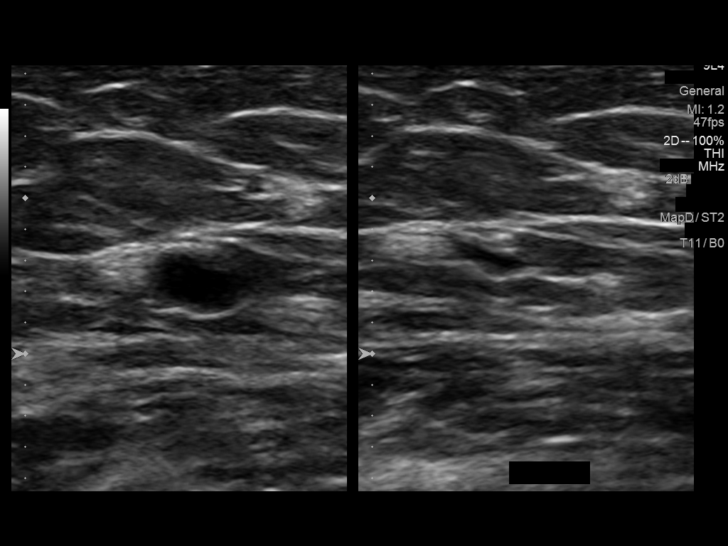
[im 34/60]
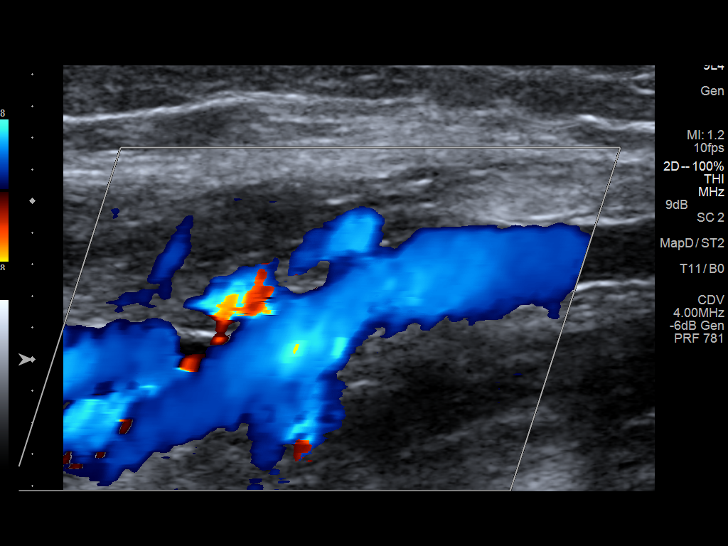
[im 39/60]
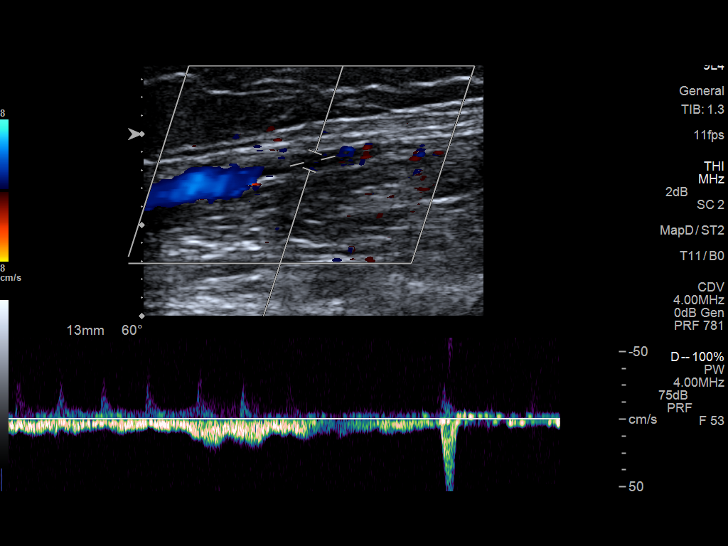
[im 44/60]
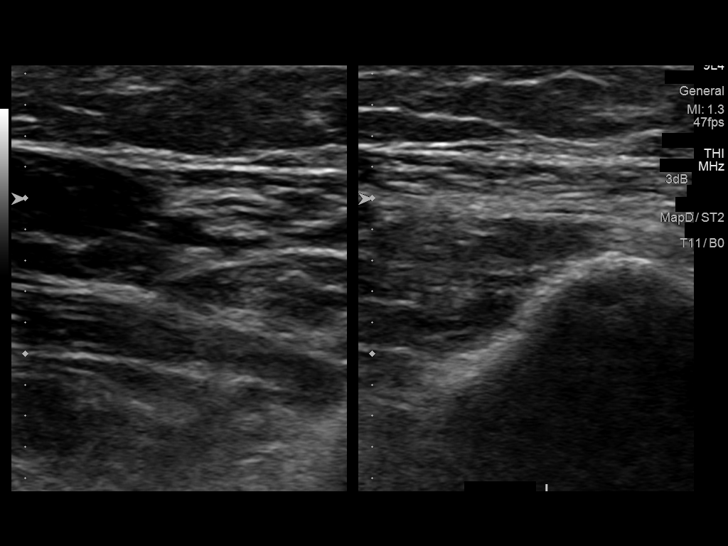
[im 49/60]
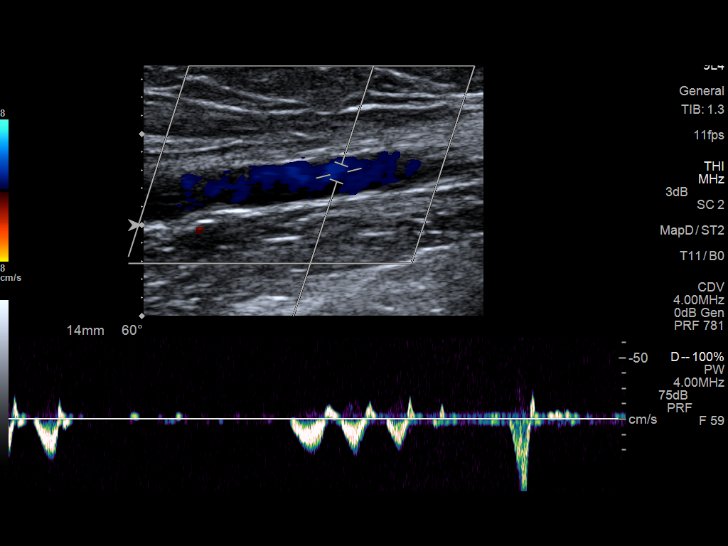
[im 54/60]
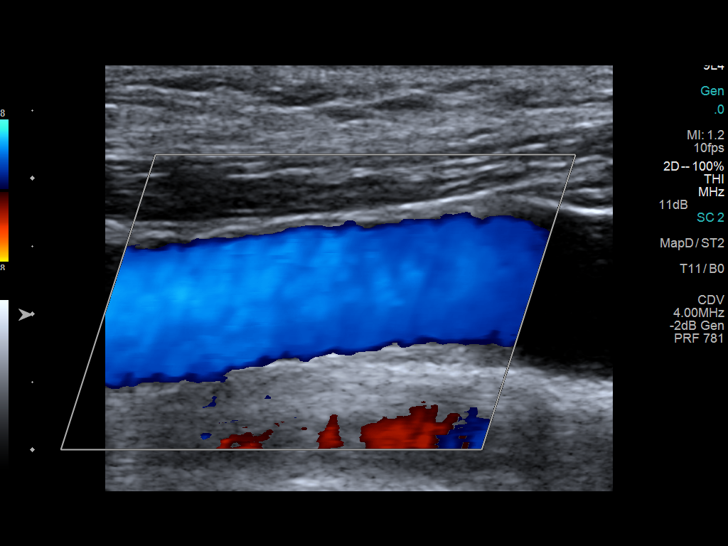
[im 60/60]
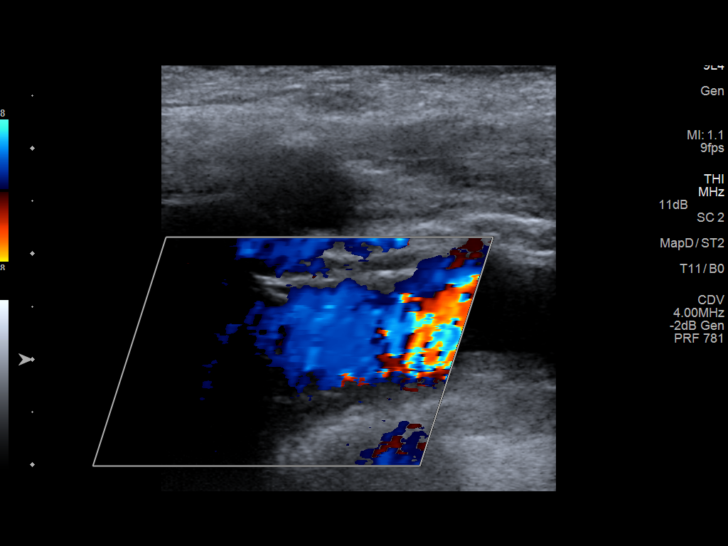

[13 of 24 positions shown; findings below may reference images not displayed]

FINDINGS: Contralateral Subclavian Vein: Respiratory phasicity is normal and
symmetric with the symptomatic side. No evidence of thrombus. Normal
compressibility.

Internal Jugular Vein: No evidence of thrombus. Normal
compressibility, respiratory phasicity and response to augmentation.

Subclavian Vein: No evidence of thrombus. Normal compressibility,
respiratory phasicity and response to augmentation.

Axillary Vein: No evidence of thrombus. Normal compressibility,
respiratory phasicity and response to augmentation.

Cephalic Vein: No evidence of thrombus. Normal compressibility,
respiratory phasicity and response to augmentation.

Basilic Vein: No evidence of thrombus. Normal compressibility,
respiratory phasicity and response to augmentation.

Brachial Veins: No evidence of thrombus. Normal compressibility,
respiratory phasicity and response to augmentation.

Radial Veins: No evidence of thrombus. Normal compressibility,
respiratory phasicity and response to augmentation.

Ulnar Veins: No evidence of thrombus. Normal compressibility,
respiratory phasicity and response to augmentation.

Venous Reflux:  None visualized.

Other Findings:  None visualized.
IMPRESSION: No evidence of deep venous thrombosis.

## 2016-10-01 IMAGING — CR DG CHEST 2V
1 series · 2 of 2 positions shown · non-contrast
Comparison: Chest radiograph May 07, 2014

CLINICAL DATA: LEFT upper extremity pain beginning at 5955 hours.
History of open heart surgery 5773.

EXAM:
CHEST  2 VIEW

[Series 1: dg chest 2 view · 0.14mm/px · 2 of 2 slices shown]
[im 1/2]
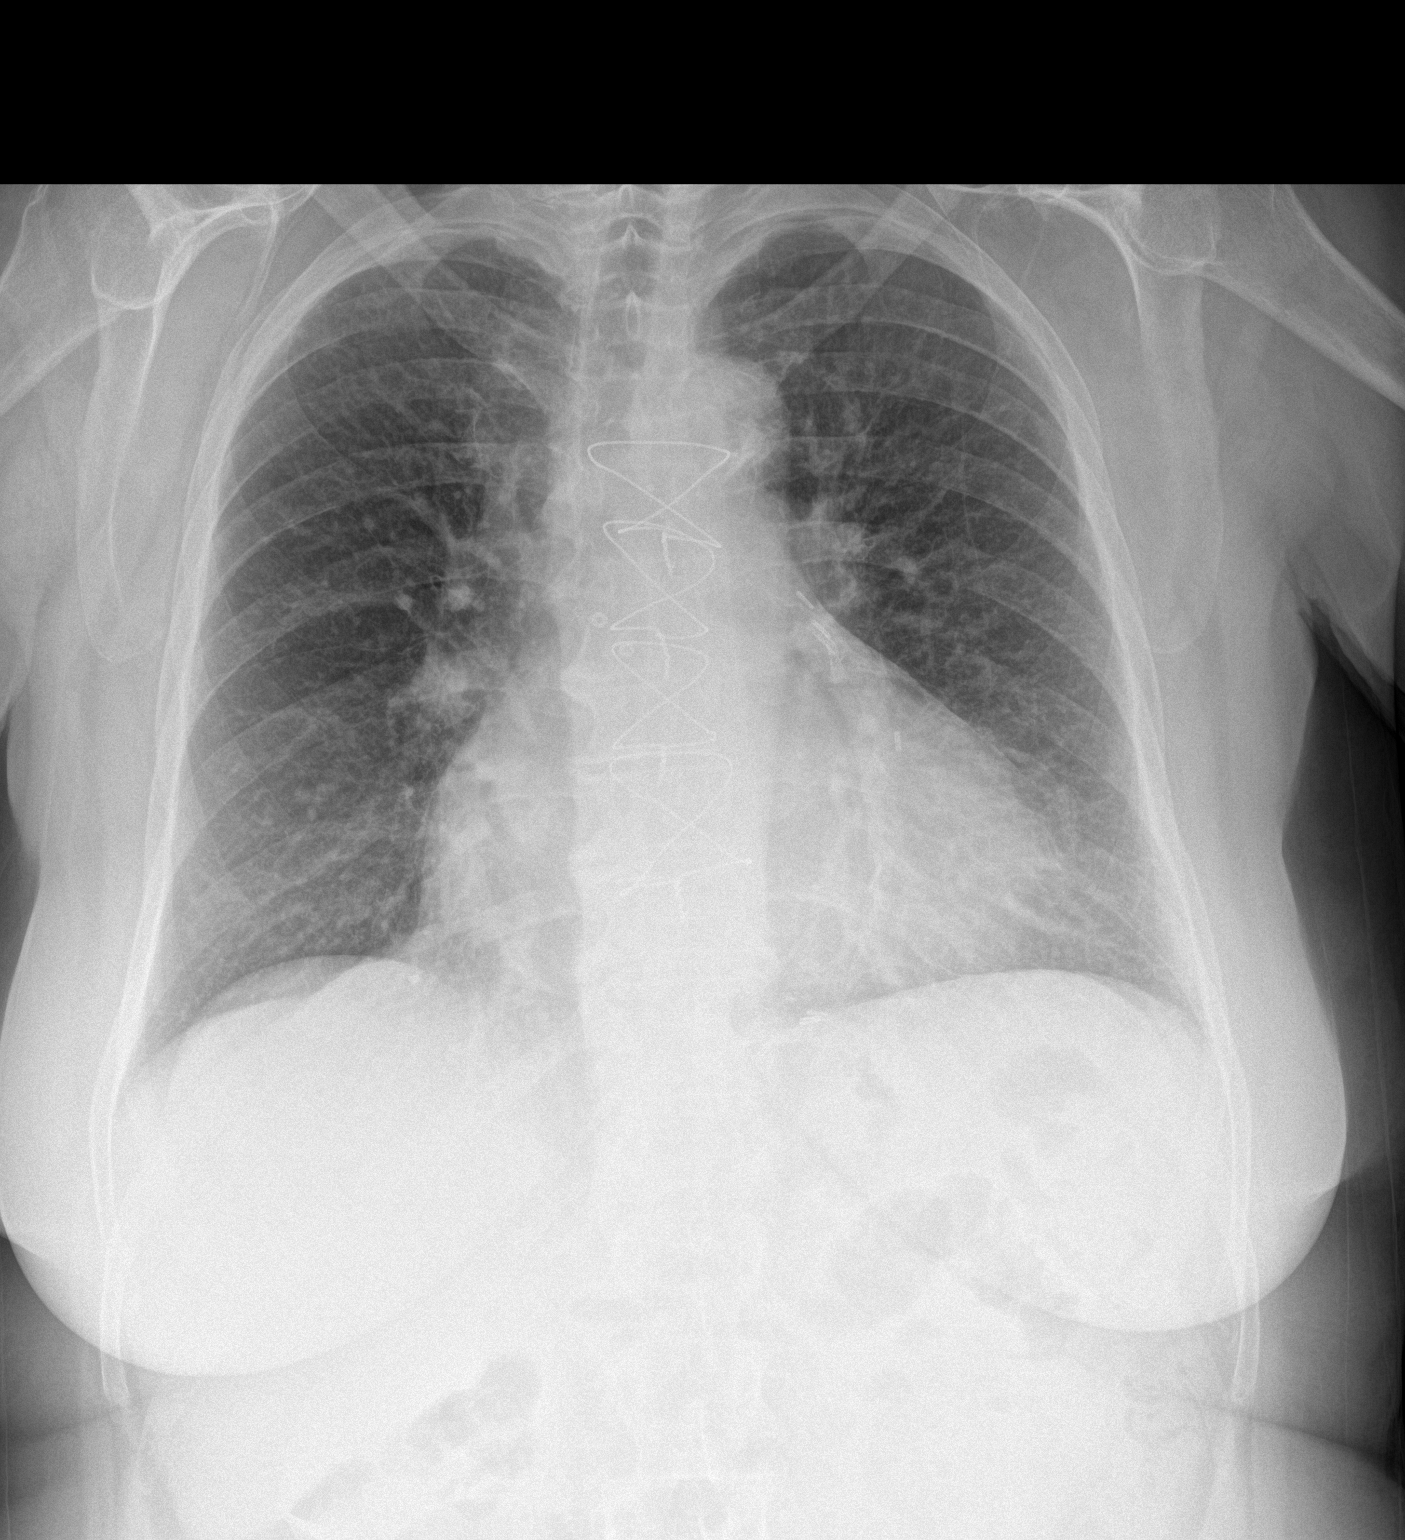
[im 2/2]
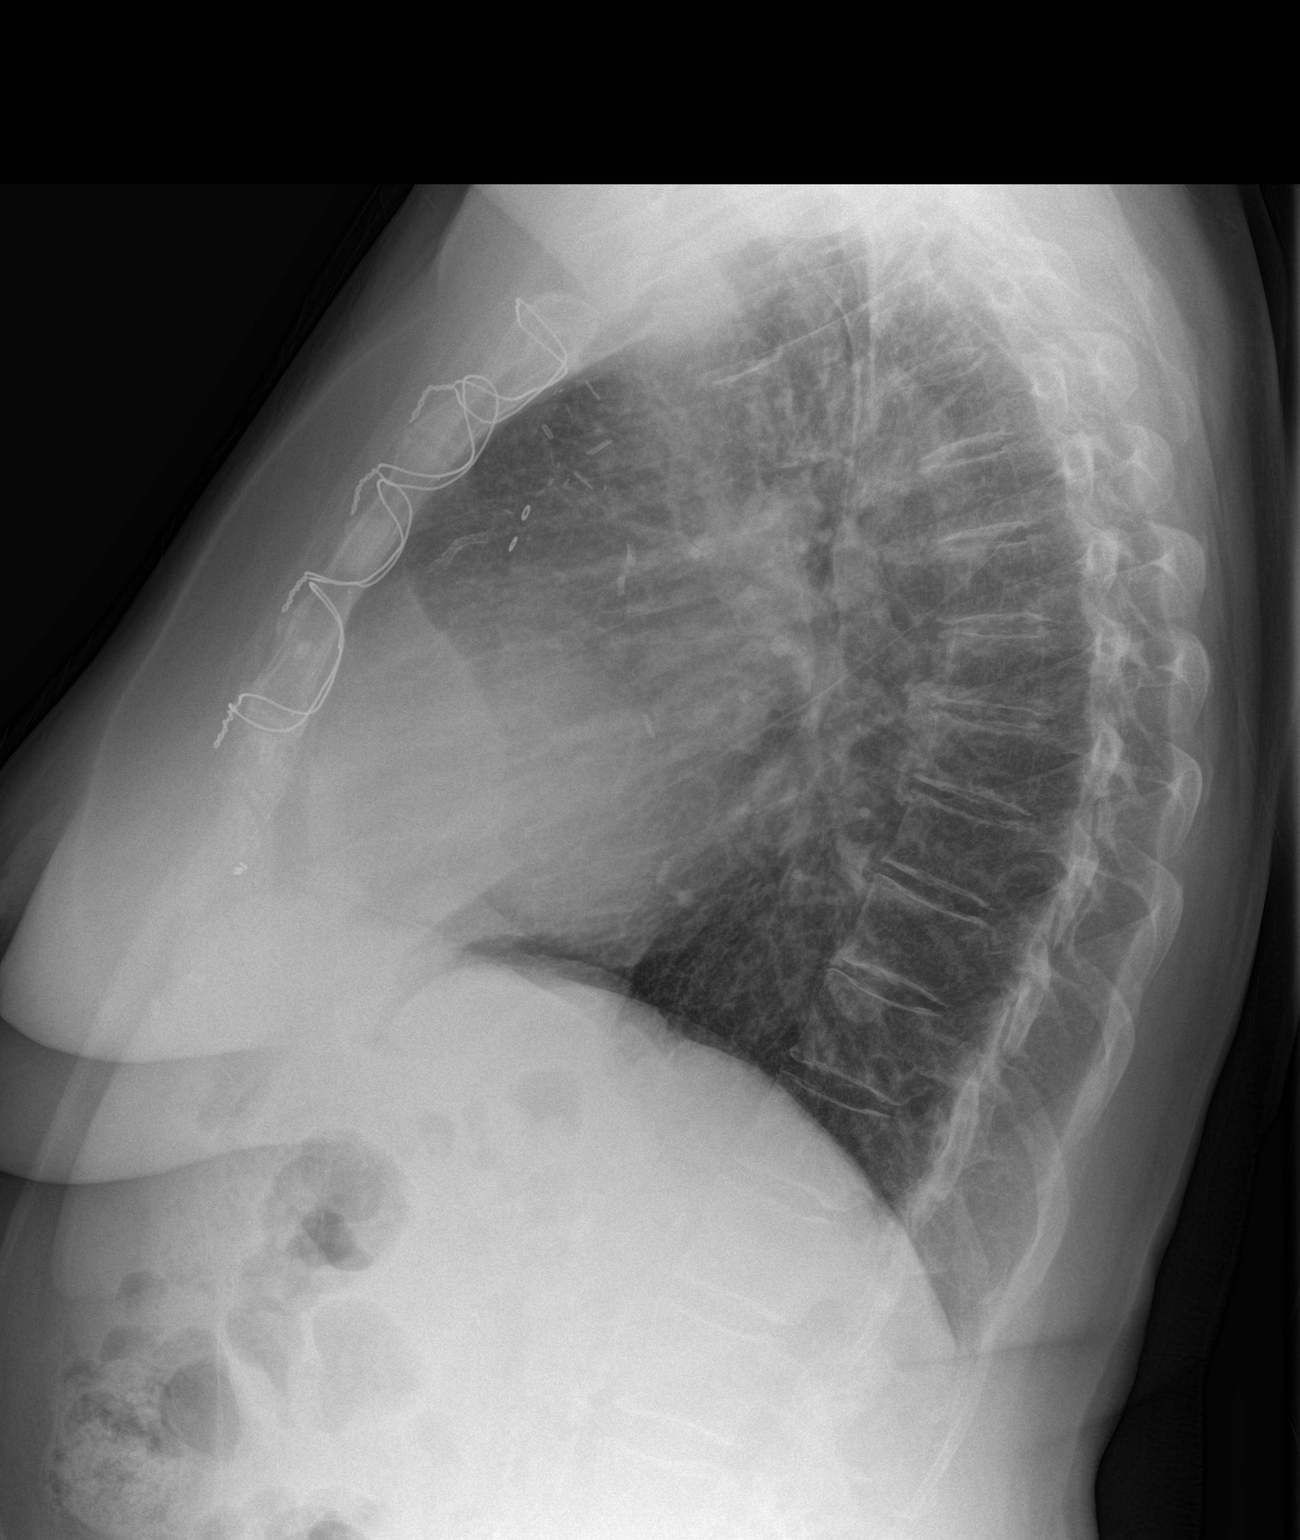

[2 of 2 positions shown; findings below may reference images not displayed]

FINDINGS: The cardiac silhouette is moderately enlarged, status post median
sternotomy. Coronary artery stents in place. Similar bronchitic
changes without pleural effusion or focal consolidation. No
pneumothorax. Soft tissue planes and included osseous structure
nonsuspicious, mild osteopenia.
IMPRESSION: Stable cardiomegaly. Similar bronchitic changes without focal
consolidation.

By: Kaki Jim

## 2016-10-19 ENCOUNTER — Inpatient Hospital Stay
Admission: EM | Admit: 2016-10-19 | Discharge: 2016-10-25 | DRG: 308 | Disposition: A | Discharge status: Home / Self Care | Payer: Medicare Other | Attending: Internal Medicine | Admitting: Internal Medicine

## 2016-10-19 ENCOUNTER — Emergency Department: Payer: Medicare Other

## 2016-10-19 ENCOUNTER — Encounter: Payer: Self-pay | Admitting: Emergency Medicine

## 2016-10-19 DIAGNOSIS — N179 Acute kidney failure, unspecified: Secondary | ICD-10-CM | POA: Diagnosis not present

## 2016-10-19 DIAGNOSIS — R001 Bradycardia, unspecified: Principal | ICD-10-CM | POA: Diagnosis present

## 2016-10-19 DIAGNOSIS — I4891 Unspecified atrial fibrillation: Secondary | ICD-10-CM | POA: Diagnosis present

## 2016-10-19 DIAGNOSIS — R0600 Dyspnea, unspecified: Secondary | ICD-10-CM

## 2016-10-19 DIAGNOSIS — W19XXXA Unspecified fall, initial encounter: Secondary | ICD-10-CM | POA: Diagnosis present

## 2016-10-19 DIAGNOSIS — D638 Anemia in other chronic diseases classified elsewhere: Secondary | ICD-10-CM | POA: Diagnosis present

## 2016-10-19 DIAGNOSIS — I2510 Atherosclerotic heart disease of native coronary artery without angina pectoris: Secondary | ICD-10-CM | POA: Diagnosis present

## 2016-10-19 DIAGNOSIS — Z7901 Long term (current) use of anticoagulants: Secondary | ICD-10-CM

## 2016-10-19 DIAGNOSIS — E872 Acidosis: Secondary | ICD-10-CM | POA: Diagnosis present

## 2016-10-19 DIAGNOSIS — I501 Left ventricular failure: Secondary | ICD-10-CM

## 2016-10-19 DIAGNOSIS — N183 Chronic kidney disease, stage 3 (moderate): Secondary | ICD-10-CM | POA: Diagnosis present

## 2016-10-19 DIAGNOSIS — E1122 Type 2 diabetes mellitus with diabetic chronic kidney disease: Secondary | ICD-10-CM | POA: Diagnosis present

## 2016-10-19 DIAGNOSIS — S0003XA Contusion of scalp, initial encounter: Secondary | ICD-10-CM | POA: Diagnosis present

## 2016-10-19 DIAGNOSIS — Z951 Presence of aortocoronary bypass graft: Secondary | ICD-10-CM

## 2016-10-19 DIAGNOSIS — E785 Hyperlipidemia, unspecified: Secondary | ICD-10-CM | POA: Diagnosis present

## 2016-10-19 DIAGNOSIS — Z885 Allergy status to narcotic agent status: Secondary | ICD-10-CM

## 2016-10-19 DIAGNOSIS — Z9119 Patient's noncompliance with other medical treatment and regimen: Secondary | ICD-10-CM

## 2016-10-19 DIAGNOSIS — F0390 Unspecified dementia without behavioral disturbance: Secondary | ICD-10-CM | POA: Diagnosis present

## 2016-10-19 DIAGNOSIS — J9690 Respiratory failure, unspecified, unspecified whether with hypoxia or hypercapnia: Secondary | ICD-10-CM | POA: Diagnosis present

## 2016-10-19 DIAGNOSIS — R55 Syncope and collapse: Secondary | ICD-10-CM

## 2016-10-19 DIAGNOSIS — F419 Anxiety disorder, unspecified: Secondary | ICD-10-CM | POA: Diagnosis present

## 2016-10-19 DIAGNOSIS — F03A0 Unspecified dementia, mild, without behavioral disturbance, psychotic disturbance, mood disturbance, and anxiety: Secondary | ICD-10-CM | POA: Diagnosis present

## 2016-10-19 DIAGNOSIS — I130 Hypertensive heart and chronic kidney disease with heart failure and stage 1 through stage 4 chronic kidney disease, or unspecified chronic kidney disease: Secondary | ICD-10-CM | POA: Diagnosis present

## 2016-10-19 DIAGNOSIS — J45909 Unspecified asthma, uncomplicated: Secondary | ICD-10-CM | POA: Diagnosis present

## 2016-10-19 DIAGNOSIS — I482 Chronic atrial fibrillation: Secondary | ICD-10-CM | POA: Diagnosis present

## 2016-10-19 DIAGNOSIS — I5022 Chronic systolic (congestive) heart failure: Secondary | ICD-10-CM | POA: Diagnosis present

## 2016-10-19 DIAGNOSIS — Z79899 Other long term (current) drug therapy: Secondary | ICD-10-CM

## 2016-10-19 LAB — CBC WITH DIFFERENTIAL/PLATELET
Basophils Absolute: 0.1 10*3/uL (ref 0–0.1)
Basophils Relative: 1 %
EOS ABS: 0 10*3/uL (ref 0–0.7)
Eosinophils Relative: 0 %
HEMATOCRIT: 29 % — AB (ref 35.0–47.0)
HEMOGLOBIN: 8.8 g/dL — AB (ref 12.0–16.0)
LYMPHS ABS: 1.6 10*3/uL (ref 1.0–3.6)
LYMPHS PCT: 15 %
MCH: 24.9 pg — ABNORMAL LOW (ref 26.0–34.0)
MCHC: 30.3 g/dL — AB (ref 32.0–36.0)
MCV: 82 fL (ref 80.0–100.0)
MONOS PCT: 4 %
Monocytes Absolute: 0.5 10*3/uL (ref 0.2–0.9)
NEUTROS ABS: 8.6 10*3/uL — AB (ref 1.4–6.5)
NEUTROS PCT: 80 %
Platelets: 339 10*3/uL (ref 150–440)
RBC: 3.54 MIL/uL — AB (ref 3.80–5.20)
RDW: 22.2 % — AB (ref 11.5–14.5)
WBC: 10.8 10*3/uL (ref 3.6–11.0)

## 2016-10-19 LAB — TROPONIN I
Troponin I: 0.03 ng/mL (ref <0.03)
Troponin I: 0.03 ng/mL (ref <0.03)
Troponin I: 0.03 ng/mL (ref <0.03)

## 2016-10-19 LAB — COMPREHENSIVE METABOLIC PANEL
ALT: 17 U/L (ref 14–54)
ANION GAP: 9 (ref 5–15)
AST: 21 U/L (ref 15–41)
Albumin: 3.3 g/dL — ABNORMAL LOW (ref 3.5–5.0)
Alkaline Phosphatase: 147 U/L — ABNORMAL HIGH (ref 38–126)
BILIRUBIN TOTAL: 1 mg/dL (ref 0.3–1.2)
BUN: 26 mg/dL — ABNORMAL HIGH (ref 6–20)
CO2: 20 mmol/L — ABNORMAL LOW (ref 22–32)
Calcium: 8.3 mg/dL — ABNORMAL LOW (ref 8.9–10.3)
Chloride: 114 mmol/L — ABNORMAL HIGH (ref 101–111)
Creatinine, Ser: 1.58 mg/dL — ABNORMAL HIGH (ref 0.44–1.00)
GFR calc Af Amer: 36 mL/min — ABNORMAL LOW (ref >60)
GFR, EST NON AFRICAN AMERICAN: 31 mL/min — AB (ref >60)
Glucose, Bld: 218 mg/dL — ABNORMAL HIGH (ref 65–99)
Potassium: 4.3 mmol/L (ref 3.5–5.1)
Sodium: 143 mmol/L (ref 135–145)
TOTAL PROTEIN: 6.2 g/dL — AB (ref 6.5–8.1)

## 2016-10-19 LAB — GLUCOSE, CAPILLARY
GLUCOSE-CAPILLARY: 188 mg/dL — AB (ref 65–99)
GLUCOSE-CAPILLARY: 171 mg/dL — AB (ref 65–99)
Glucose-Capillary: 121 mg/dL — ABNORMAL HIGH (ref 65–99)

## 2016-10-19 LAB — LACTIC ACID, PLASMA
LACTIC ACID, VENOUS: 3 mmol/L — AB (ref 0.5–1.9)
Lactic Acid, Venous: 2.7 mmol/L (ref 0.5–1.9)

## 2016-10-19 LAB — MRSA PCR SCREENING: MRSA BY PCR: NEGATIVE

## 2016-10-19 MED ORDER — ACETAMINOPHEN 650 MG RE SUPP: 650.0000 mg | Freq: Four times a day (QID) | RECTAL | Status: DC | PRN

## 2016-10-19 MED ORDER — ONDANSETRON HCL 4 MG/2ML IJ SOLN
4.0000 mg | Freq: Four times a day (QID) | INTRAMUSCULAR | Status: DC | PRN
  Administered 2016-10-19 – 2016-10-24 (×4): 4 mg via INTRAVENOUS
  Filled 2016-10-19 (×4): qty 2

## 2016-10-19 MED ORDER — GLUCAGON HCL RDNA (DIAGNOSTIC) 1 MG IJ SOLR
1.0000 mg | Freq: Once | INTRAMUSCULAR | Status: DC | PRN
Start: 2016-10-19 — End: 2016-10-25
  Filled 2016-10-19: qty 1

## 2016-10-19 MED ORDER — INSULIN ASPART 100 UNIT/ML ~~LOC~~ SOLN
0.0000 [IU] | Freq: Three times a day (TID) | SUBCUTANEOUS | Status: DC
  Administered 2016-10-19 – 2016-10-23 (×3): 2 [IU] via SUBCUTANEOUS
  Administered 2016-10-23: 1 [IU] via SUBCUTANEOUS
  Administered 2016-10-23: 2 [IU] via SUBCUTANEOUS
  Administered 2016-10-24 (×2): 1 [IU] via SUBCUTANEOUS
  Administered 2016-10-25: 2 [IU] via SUBCUTANEOUS
  Filled 2016-10-19 (×8): qty 1

## 2016-10-19 MED ORDER — SODIUM CHLORIDE 0.9 % IV SOLN
INTRAVENOUS | Status: DC
  Administered 2016-10-19: 12:00:00 via INTRAVENOUS

## 2016-10-19 MED ORDER — GLUCERNA SHAKE PO LIQD
237.0000 mL | Freq: Three times a day (TID) | ORAL | Status: DC
  Administered 2016-10-19 – 2016-10-25 (×12): 237 mL via ORAL

## 2016-10-19 MED ORDER — ALBUTEROL SULFATE (2.5 MG/3ML) 0.083% IN NEBU
3.0000 mL | INHALATION_SOLUTION | Freq: Four times a day (QID) | RESPIRATORY_TRACT | Status: DC | PRN
  Administered 2016-10-19 – 2016-10-24 (×5): 3 mL via RESPIRATORY_TRACT
  Filled 2016-10-19 (×5): qty 3

## 2016-10-19 MED ORDER — KETOROLAC TROMETHAMINE 0.5 % OP SOLN
1.0000 [drp] | Freq: Four times a day (QID) | OPHTHALMIC | Status: DC
  Administered 2016-10-19 – 2016-10-22 (×8): 1 [drp] via OPHTHALMIC
  Filled 2016-10-19: qty 3

## 2016-10-19 MED ORDER — OFLOXACIN 0.3 % OP SOLN
1.0000 [drp] | Freq: Four times a day (QID) | OPHTHALMIC | Status: DC
  Administered 2016-10-19 – 2016-10-25 (×20): 1 [drp] via OPHTHALMIC
  Filled 2016-10-19 (×2): qty 5

## 2016-10-19 MED ORDER — INSULIN ASPART 100 UNIT/ML ~~LOC~~ SOLN: 0.0000 [IU] | Freq: Every day | SUBCUTANEOUS | Status: DC

## 2016-10-19 MED ORDER — ONDANSETRON HCL 4 MG PO TABS
4.0000 mg | ORAL_TABLET | Freq: Four times a day (QID) | ORAL | Status: DC | PRN
  Filled 2016-10-19: qty 1

## 2016-10-19 MED ORDER — ATORVASTATIN CALCIUM 20 MG PO TABS
40.0000 mg | ORAL_TABLET | Freq: Every day | ORAL | Status: DC
  Administered 2016-10-19 – 2016-10-24 (×6): 40 mg via ORAL
  Filled 2016-10-19 (×6): qty 2

## 2016-10-19 MED ORDER — NITROGLYCERIN 0.4 MG SL SUBL
0.4000 mg | SUBLINGUAL_TABLET | SUBLINGUAL | Status: DC | PRN
  Administered 2016-10-20 – 2016-10-21 (×5): 0.4 mg via SUBLINGUAL
  Filled 2016-10-19 (×5): qty 1

## 2016-10-19 MED ORDER — PREDNISOLONE ACETATE 1 % OP SUSP
1.0000 [drp] | Freq: Four times a day (QID) | OPHTHALMIC | Status: DC
  Administered 2016-10-19 – 2016-10-25 (×19): 1 [drp] via OPHTHALMIC
  Filled 2016-10-19 (×2): qty 1

## 2016-10-19 MED ORDER — SODIUM CHLORIDE 0.9 % IV SOLN
1.0000 g | Freq: Once | INTRAVENOUS | Status: AC
  Administered 2016-10-19: 1 g via INTRAVENOUS

## 2016-10-19 MED ORDER — DOPAMINE-DEXTROSE 3.2-5 MG/ML-% IV SOLN
INTRAVENOUS | Status: AC
  Filled 2016-10-19: qty 250

## 2016-10-19 MED ORDER — ACETAMINOPHEN 325 MG PO TABS
650.0000 mg | ORAL_TABLET | Freq: Four times a day (QID) | ORAL | Status: DC | PRN
  Administered 2016-10-19 – 2016-10-24 (×9): 650 mg via ORAL
  Filled 2016-10-19 (×9): qty 2

## 2016-10-19 NOTE — H&P (Signed)
[facility] [place] [facility]   PATIENT NAME: Robin [name]    MR#:  [id number]  DATE OF BIRTH:  1940-03-14  DATE OF ADMISSION:  10/19/2016  PRIMARY CARE PHYSICIAN: Leanna Sato, MD   REQUESTING/REFERRING PHYSICIAN: Dr Sharyn Creamer  CHIEF COMPLAINT:   Chief Complaint  Patient presents with  . Fall  . Bradycardia    HISTORY OF PRESENT ILLNESS:  Robin [name]  is a 76 y.o. female with a known history of Atrial fibrillation. She presents to the ER with a syncopal episode. A few days ago she had cataract surgery and was placed on eyedrops. I'm still waiting on which eyedrops these were. She was unable to tell me much about the fall occurred she cannot recall what happened. She complains of a headache at this time. The patient is on 3 medications for heart rate control including amiodarone, metoprolol and Cardizem. She is also on Eliquis. The patient does complain of some back pain and weakness. In the ER she was noted to have a heart rate in the 20s and 30s and a junctional rhythm. The patient was given some calcium in the ER.  PAST MEDICAL HISTORY:   Past Medical History:  Diagnosis Date  . Afib (HCC)   . Anxiety   . Asthma   . CHF (congestive heart failure) (HCC)   . Coronary artery disease   . Diabetes mellitus without complication (HCC)   . Hyperlipidemia   . Hypertension     PAST SURGICAL HISTORY:   Past Surgical History:  Procedure Laterality Date  . CORONARY ARTERY BYPASS GRAFT    . EYE SURGERY    . NO PAST SURGERIES    . PERIPHERAL VASCULAR CATHETERIZATION Left 03/02/2016   Procedure: Renal Intervention;  Surgeon: Renford Dills, MD;  Location: [facility] INVASIVE CV LAB;  Service: Cardiovascular;  Laterality: Left;    SOCIAL HISTORY:   Social History  Substance Use Topics  . Smoking status: Never Smoker  . Smokeless tobacco: Never Used  . Alcohol use No    FAMILY HISTORY:   Family History  Problem Relation Age of Onset   . Hypertension Mother   . Heart failure Mother   . Heart disease Father     DRUG ALLERGIES:   Allergies  Allergen Reactions  . Morphine And Related Other (See Comments)    Patient states it makes her "space out"    REVIEW OF SYSTEMS:  CONSTITUTIONAL: No fever. Positive for fatigue. Positive for headache. Positive for weight gain EYES: No blurred or double vision.  EARS, NOSE, AND THROAT: No tinnitus or ear pain. No sore throat RESPIRATORY: Positive for cough and shortness of breath. No wheezing or hemoptysis.  CARDIOVASCULAR: No chest pain, orthopnea, edema.  GASTROINTESTINAL: No nausea, vomiting, diarrhea or abdominal pain. No blood in bowel movements GENITOURINARY: No dysuria, hematuria.  ENDOCRINE: No polyuria, nocturia,  HEMATOLOGY: No anemia, easy bruising or bleeding SKIN: No rash or lesion. MUSCULOSKELETAL: Positive for back pain   NEUROLOGIC: No tingling, numbness, weakness.  PSYCHIATRY: No anxiety or depression.   MEDICATIONS AT HOME:   Prior to Admission medications   Medication Sig Start Date End Date Taking? Authorizing Provider  amiodarone (PACERONE) 400 MG tablet Take 400 mg by mouth daily. 07/20/16  Yes [provider]  apixaban (ELIQUIS) 5 MG TABS tablet Take 5 mg by mouth 2 (two) times daily. 07/04/16 07/03/17 Yes [provider]  atorvastatin (LIPITOR) 40 MG tablet Take 40 mg by mouth daily.  Yes [provider]  diltiazem (CARDIZEM CD) 180 MG 24 hr capsule Take 1 capsule (180 mg total) by mouth daily. 03/05/16  Yes Enedina Finner, MD  furosemide (LASIX) 20 MG tablet 40 mg in the morning and 20 mg in the vening 08/03/16  Yes Enedina Finner, MD  glipiZIDE (GLUCOTROL) 5 MG tablet Take 0.5 tablets (2.5 mg total) by mouth daily before breakfast. 03/06/16  Yes Enedina Finner, MD  hydrALAZINE (APRESOLINE) 25 MG tablet Take 75 mg by mouth 3 (three) times daily.   Yes [provider]  lisinopril (PRINIVIL,ZESTRIL) 40 MG tablet Take 40 mg by  mouth daily.   Yes [provider]  metoprolol succinate (TOPROL-XL) 100 MG 24 hr tablet Take 100 mg by mouth daily.   Yes [provider]  albuterol (ACCUNEB) 1.25 MG/3ML nebulizer solution Take 1.25 mg by nebulization every 4 (four) hours as needed for wheezing or shortness of breath.     [provider]  albuterol (PROVENTIL HFA;VENTOLIN HFA) 108 (90 Base) MCG/ACT inhaler Inhale 2 puffs into the lungs.    [provider]  famotidine (PEPCID) 20 MG tablet Take 20 mg by mouth 2 (two) times daily as needed. 07/03/16 08/02/16  [provider]  feeding supplement, GLUCERNA SHAKE, (GLUCERNA SHAKE) LIQD Take 237 mLs by mouth 3 (three) times daily between meals. 08/02/16   Enedina Finner, MD  lisinopril (PRINIVIL,ZESTRIL) 10 MG tablet Take 1 tablet (10 mg total) by mouth daily. Patient not taking: Reported on 10/19/2016 08/03/16 09/02/16  Enedina Finner, MD  lisinopril (PRINIVIL,ZESTRIL) 40 MG tablet Take 40 mg by mouth daily. 07/03/16 08/02/16  [provider]  nitroGLYCERIN (NITROSTAT) 0.4 MG SL tablet Place 1 tablet (0.4 mg total) under the tongue every 5 (five) minutes as needed for chest pain. 01/25/16   Adrian Saran, MD      VITAL SIGNS:  Blood pressure (!) 153/69, pulse (!) 31, resp. rate (!) 2, height 5\' 4"  (1.626 m), weight 52.2 kg (115 lb), SpO2 100 %.  PHYSICAL EXAMINATION:  GENERAL:  76 y.o.-year-old patient lying in the bed with no acute distress.  EYES: Pupils equal, round, reactive to light and accommodation. No scleral icterus. Extraocular muscles intact.  HEENT: Head atraumatic, normocephalic. Oropharynx and nasopharynx clear.  NECK:  Supple, no jugular venous distention. No thyroid enlargement, no tenderness.  LUNGS: Normal breath sounds bilaterally, no wheezing, rales,rhonchi or crepitation. No use of accessory muscles of respiration.  CARDIOVASCULAR: S1, S2 Bradycardic. No murmurs, rubs, or gallops.  ABDOMEN: Soft, nontender, nondistended.  Bowel sounds present. No organomegaly or mass.  EXTREMITIES: Trace pedal edema, no cyanosis, or clubbing.  NEUROLOGIC: Cranial nerves II through XII are intact. Muscle strength 5/5 in all extremities. Sensation intact. Gait not checked.  PSYCHIATRIC: The patient is alert and oriented x 3. The patient states that she can't think clearly. SKIN: No rash, lesion, or ulcer. Abrasions on forehead and nose.  LABORATORY PANEL:   CBC  Recent Labs Lab 10/19/16 0958  WBC 10.8  HGB 8.8*  HCT 29.0*  PLT 339   ------------------------------------------------------------------------------------------------------------------  Chemistries   Recent Labs Lab 10/19/16 0958  NA 143  K 4.3  CL 114*  CO2 20*  GLUCOSE 218*  BUN 26*  CREATININE 1.58*  CALCIUM 8.3*  AST 21  ALT 17  ALKPHOS 147*  BILITOT 1.0   ------------------------------------------------------------------------------------------------------------------  Cardiac Enzymes  Recent Labs Lab 10/19/16 0958  TROPONINI 0.03*   ------------------------------------------------------------------------------------------------------------------  RADIOLOGY:  Ct Head Wo Contrast  Result Date: 10/19/2016 CLINICAL  DATA:  Maxillofacial blunt trauma. Cataract surgery yesterday and fall this morning. Bradycardia. Initial encounter. EXAM: CT HEAD WITHOUT CONTRAST CT CERVICAL SPINE WITHOUT CONTRAST TECHNIQUE: Multidetector CT imaging of the head and cervical spine was performed following the standard protocol without intravenous contrast. Multiplanar CT image reconstructions of the cervical spine were also generated. COMPARISON:  06/07/2016 head CT. FINDINGS: CT HEAD FINDINGS Brain: No evidence of acute infarction, hemorrhage, hydrocephalus, extra-axial collection or mass lesion/mass effect. 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 Chronic microvascular ischemic change in  the cerebral white matter, moderate. Vascular: Atherosclerotic calcification.  No hyperdense vessel. Skull: Soft tissue contusion over the left temporal fossa and right forehead. No underlying fracture. Sinuses/Orbits: Bilateral cataract resection, with surgery reportedly yesterday. No evidence of postseptal injury or swelling. CT CERVICAL SPINE FINDINGS Alignment: No malalignment. Skull base and vertebrae: Negative for acute fracture. Soft tissues and spinal canal: No prevertebral fluid or swelling. No visible canal hematoma. Disc levels: C6-7 predominant disc degeneration with narrowing and asymmetric left uncovertebral spurring and foraminal narrowing. Upper cervical facet spurring. Upper chest: There are layering pleural effusions and interlobular septal thickening at the apices. IMPRESSION: 1. No evidence of acute intracranial or cervical spine injury. 2. Facial contusion without fracture. 3. Layering pleural effusions and probable interstitial edema at the apices. 4. 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 Chronic small vessel ischemia. Electronically Signed   By: Marnee Spring M.D.   On: 10/19/2016 11:03   Ct Cervical Spine Wo Contrast  Result Date: 10/19/2016 CLINICAL DATA:  Maxillofacial blunt trauma. Cataract surgery yesterday and fall this morning. Bradycardia. Initial encounter. EXAM: CT HEAD WITHOUT CONTRAST CT CERVICAL SPINE WITHOUT CONTRAST TECHNIQUE: Multidetector CT imaging of the head and cervical spine was performed following the standard protocol without intravenous contrast. Multiplanar CT image reconstructions of the cervical spine were also generated. COMPARISON:  06/07/2016 head CT. FINDINGS: CT HEAD FINDINGS Brain: No evidence of acute infarction, hemorrhage, hydrocephalus, extra-axial collection or mass lesion/mass effect. Chronic appearing infarct in the right occipital and parietal lobes, roughly posterior watershed  distribution. Chronic microvascular ischemic change in the cerebral white matter, moderate. Vascular: Atherosclerotic calcification.  No hyperdense vessel. Skull: Soft tissue contusion over the left temporal fossa and right forehead. No underlying fracture. Sinuses/Orbits: Bilateral cataract resection, with surgery reportedly yesterday. No evidence of postseptal injury or swelling. CT CERVICAL SPINE FINDINGS Alignment: No malalignment. Skull base and vertebrae: Negative for acute fracture. Soft tissues and spinal canal: No prevertebral fluid or swelling. No visible canal hematoma. Disc levels: C6-7 predominant disc degeneration with narrowing and asymmetric left uncovertebral spurring and foraminal narrowing. Upper cervical facet spurring. Upper chest: There are layering pleural effusions and interlobular septal thickening at the apices. IMPRESSION: 1. No evidence of acute intracranial or cervical spine injury. 2. Facial contusion without fracture. 3. Layering pleural effusions and probable interstitial edema at the apices. 4. 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 Chronic small vessel ischemia. Electronically Signed   By: Marnee Spring M.D.   On: 10/19/2016 11:03    EKG:   Junctional rhythm 27 bpm, intraventricular conduction delay, ventricular premature contraction  IMPRESSION AND PLAN:   1. Severe bradycardia with junctional rhythm with syncopal episode. Case discussed with Dr. Lady Gary cardiology and he is hesitant on placing a temporary pacemaker since the patient does take Eliquis. Monitor in the CCU closely. Patient was given calcium already to try  to reverse beta blocker. I will give a dose of glucagon to try to reverse beta blocker. Need to hold metoprolol, Cardizem and amiodarone at this time. Need to clarify which eyedrops the patient was taking which could be contributing to the issue. Case discussed with Dr. Belia Heman critical care  specialist. 2. Acute kidney injury on chronic kidney disease stage III. Gentle IV fluid hydration 3. Essential hypertension. With the patient's bradycardia L hold all antihypertensive medications at this point 4. Head trauma on Eliquis. Watch neurological status closely in the ICU.  5. Atrial fibrillation history. Holding anticoagulation for the possibility of a pacemaker. Can consider heparin drip but with the head trauma would like to hold off on anticoagulation at this point. Holding all rate control medications with bradycardia. 6. Lactic acidosis. Monitor closely. IV fluid hydration 7. Anemia. Send off some iron studies and continue to monitor. 8. Type 2 diabetes mellitus put on sliding scale and hold oral diabetic medications 9. Hyperlipidemia unspecified on atorvastatin 10. No signs of congestive heart failure at this point  All the records are reviewed and case discussed with ED provider. Management plans discussed with the patient, family and they are in agreement.  CODE STATUS: Full code  TOTAL TIME TAKING CARE OF THIS PATIENT:50 minutes.    Alford Highland M.D on 10/19/2016 at 11:35 AM  Between 7am to 6pm - Pager - [phone]  After 6pm call admission pager [phone]  [facility]  [phone]  CC: Primary care physician; Leanna Sato, MD

## 2016-10-19 NOTE — ED Triage Notes (Signed)
catarac surgery yesterday, this am fall. Has abrasion on forehead, bridge of nose and right nare.  Brought by ems and they noted bradaycardia.  Rate ranges from uper 20s to low 40s.  Pt is warm and dry and speaking to [place] normally.

## 2016-10-19 NOTE — Clinical Social Work Note (Signed)
Clinical Social Work Assessment  Patient Details  Name: Rolena Sappenfield MRN: [id number] Date of Birth: 1940/11/01  Date of referral:  10/19/16               Reason for consult:  Facility Placement                Permission sought to share information with:    Permission granted to share information::     Name::        Agency::     Relationship::     Contact Information:     Housing/Transportation Living arrangements for the past 2 months:  Single Family Home Source of Information:  Other (Comment Required) Lowanda Foster with [employer] APS) Patient Interpreter Needed:  None Criminal Activity/Legal Involvement Pertinent to Current Situation/Hospitalization:  No - Comment as needed Significant Relationships:  Adult Children Lives with:  Significant Other Do you feel safe going back to the place where you live?    Need for family participation in patient care:  Yes (Comment)  Care giving concerns:  Yes. DSS Adult Protective Services has an open investigation and is currently recommending placement.    Social Worker assessment / plan:  CSW informed by RN CM that patient has an open DSS APS case. Patient's DSS caseworker is [place] at : [phone]. CSW contacted [place] this afternoon and explained role and purpose of call. Patient currently not a good historian. [place] informed this CSW that patient's significant other/friend, [name] [phone]), attempts to care for patient but there is concern that he is not able to cook for her and provide her correct medication administration. [place] stated as a result of the concern regarding patient's care by [facility], the patient's two daughters were to be providing care and alternating with each other. Patient's daughters are: Misty Stanley [name]: [phone] and Donell Sievert: [phone]. [place] reports that DSS APS is going to be looking into guardianship for patient eventually but that if at all possible and if patient is agreeable, that she needs placement.  [place] explained that patient has been placed once prior, at [facility], but the family took her out of [employer] against medical advice.  Patient will require a PT consult to evaluate for mobility and recommendations when appropriate.  Employment status:    Insurance information:  Medicare PT Recommendations:    Information / Referral to community resources:     Patient/Family's Response to care:  Unknown at this time.  Patient/Family's Understanding of and Emotional Response to Diagnosis, Current Treatment, and Prognosis:  Unknown at this time.  Emotional Assessment Appearance:    Attitude/Demeanor/Rapport:    Affect (typically observed):    Orientation:   (currently confused) Alcohol / Substance use:  Not Applicable Psych involvement (Current and /or in the community):  No (Comment)  Discharge Needs  Concerns to be addressed:    Readmission within the last 30 days:  No Current discharge risk:  Cognitively Impaired, Lack of support system Barriers to Discharge:  No Barriers Identified   York Spaniel, LCSW 10/19/2016, 3:36 PM

## 2016-10-19 NOTE — Progress Notes (Signed)
Dr. Dellie Catholic notified of elevated LA of 2.7. Orders to continue IVF only at this time.  Trudee Kuster

## 2016-10-19 NOTE — ED Notes (Signed)
Reviewed pt's medication cards from [facility]. Noted 9 days of meds punched out of PM card, and 13 days of meds punched out of AM card, including 100MG  metoprolol ER. Pt's family state pt's son has been giving pt meds since surgery.

## 2016-10-19 NOTE — Care Management (Addendum)
This RNCM spoke with Robin [name] at [employer] regarding report that I filed with them in Dec. 2017 and per [facility] patient currently has an open case with APS. CSW updated. Per APS they have submitted for guardianship however this has not been established. Daughters are POA- Robin [name] (lives in [place]), Stoddard (lives in [place]). [place] with [employer]PS would like updates [phone].

## 2016-10-19 NOTE — Consult Note (Signed)
[facility] CLINIC CARDIOLOGY A [facility] PRACT[facility]  CARDIOLOGY CONSULT NOTE  Patient ID: Robin [name] MRN: [id number] DOB/AGE: Nov 01, 1940 76 y.o.  Admit date: 10/19/2016 Referring Physician Dr. Fanny Bien Primary Physician   Primary Cardiologist Dr. Lady Gary Reason for Consultation bradycardia  HPI: Patient is a 76 year old female with multiple medical problems including chronic atrial fibrillation treated with amiodarone, diltiazem and metoprolol as well as anticoagulation with apixaban. She also has a history of coronary artery disease status post coronary bypass grafting, history of congestive heart failure, history of medical noncompliance, history of hyperlipidemia and hypertension as well as diabetes. She recently underwent cataract surgery. This was done 2 days ago. Prior to this she presented to [facility] emergency room with complaints of near syncope. Workup was unremarkable. Her electrocardiogram showed a rate in the 60s. She underwent cataract surgery without apparent problem was placed on a beta blocker eyedrops. She suffered a fall injuring her face and neck this morning. She was brought to the emergency room with a neck collar in place. She was noted be significant bradycardic with heart rates in the upper 20s lower 30s however was conversant and hemodynamically stable with this heart rate. Her C-spine and brain CT were cleared from her bleed or C-spine injury. Her heart rate remains in the mid to upper 30s. She remains hemodynamically stable. She is able to converse without difficulty. Is unclear whether she has been taking her medications appropriately. Her daughter is with her sometimes but is not always there. Review of her medication at administration record makes it unclear as to whether she is overtaking her medications.  Review of Systems  HENT: Negative.   Eyes: Negative.   Respiratory: Positive for shortness of breath.   Cardiovascular: Negative.     Gastrointestinal: Negative.   Genitourinary: Negative.   Musculoskeletal: Negative.   Skin: Negative.   Neurological: Positive for dizziness, loss of consciousness and weakness.  Endo/Heme/Allergies: Bruises/bleeds easily.  Psychiatric/Behavioral: The patient is nervous/anxious.     Past Medical History:  Diagnosis Date  . Afib (HCC)   . Anxiety   . Asthma   . CHF (congestive heart failure) (HCC)   . Coronary artery disease   . Diabetes mellitus without complication (HCC)   . Hyperlipidemia   . Hypertension     Family History  Problem Relation Age of Onset  . Hypertension Mother   . Heart failure Mother   . Heart disease Father     Social History   Social History  . Marital status: Divorced    Spouse name: N/A  . Number of children: N/A  . Years of education: N/A   Occupational History  . Not on file.   Social History Main Topics  . Smoking status: Never Smoker  . Smokeless tobacco: Never Used  . Alcohol use No  . Drug use: No  . Sexual activity: Not on file   Other Topics Concern  . Not on file   Social History Narrative  . No narrative on file    Past Surgical History:  Procedure Laterality Date  . CORONARY ARTERY BYPASS GRAFT    . EYE SURGERY    . NO PAST SURGERIES    . PERIPHERAL VASCULAR CATHETERIZATION Left 03/02/2016   Procedure: Renal Intervention;  Surgeon: Renford Dills, MD;  Location: [facility] INVASIVE CV LAB;  Service: Cardiovascular;  Laterality: Left;      (Not in a hospital admission)  Physical Exam: Blood pressure (!) 160/56, pulse (!) 47, resp. rate  17, height 5\' 4"  (1.626 m), weight 52.2 kg (115 lb), SpO2 99 %.   Wt Readings from Last 1 Encounters:  10/19/16 52.2 kg (115 lb)     General appearance: alert and cooperative Head: asymmetric shape, scalp contusion Resp: clear to auscultation bilaterally Chest wall: no tenderness Cardio: irregularly irregular rhythm and Bradycardic GI: soft, non-tender; bowel sounds normal; no  masses,  no organomegaly Extremities: extremities normal, atraumatic, no cyanosis or edema Neurologic: Grossly normal  Labs:   Lab Results  Component Value Date   WBC 10.8 10/19/2016   HGB 8.8 (L) 10/19/2016   HCT 29.0 (L) 10/19/2016   MCV 82.0 10/19/2016   PLT 339 10/19/2016    Recent Labs Lab 10/19/16 0958  NA 143  K 4.3  CL 114*  CO2 20*  BUN 26*  CREATININE 1.58*  CALCIUM 8.3*  PROT 6.2*  BILITOT 1.0  ALKPHOS 147*  ALT 17  AST 21  GLUCOSE 218*   Lab Results  Component Value Date   CKTOTAL 79 02/11/2014   CKMB 1.3 02/11/2014   TROPONINI 0.03 (HH) 10/19/2016      Radiology: Moderate to severe cardiomegaly with no acute pulmonary edema, cervical CT spine revealed no evidence of facial fracture, no evidence of acute intracranial or cervical spine injury. Brain CT revealed remote occipital parietal lobe infarct with no acute intracranial bleed. EKG: Atrial fibrillation with slow ventricular response  ASSESSMENT AND PLAN:  Patient is a 76 year old female with history of coronary disease, atrial fibrillation, diabetes, with recent cataract surgery with placement on eyedrops. She is on aggressive antiarrhythmic regimen including amiodarone at 40 mg daily, diltiazem at 240 mg with metoprolol. She suffered a fall causing facial trauma today. She was noted to be significantly bradycardic however was hemodynamically stable with this. She also had recently started eyedrops. Is unclear whether she has been taking oral medications appropriately. Consideration for transvenous venous pacing was discussed however given her relative hemodynamic stability and level of alertness, given her anticoagulated state, will defer transvenous pacing at present with standby transcutaneous pacing available. Will discontinue amiodarone, diltiazem, metoprolol and apixaban. Should her heart rate improves will gradually add back antiarrhythmic medications as rate tolerates. If her heart rate does not  improve off of these rate related medicaitons, medications, consideration for permanent pacing could be raised after her apixaban has been held for 2 days. Signed: Dalia Heading MD, [facility] 10/19/2016, 1:18 PM

## 2016-10-19 NOTE — ED Provider Notes (Signed)
[facility] Emergency Department Provider Note   ____________________________________________   First MD Initiated Contact with Patient 10/19/16 [phone]     (approximate)  I have reviewed the triage vital signs and the nursing notes.   HISTORY  Chief Complaint Fall and Bradycardia  EM caveat: Patient very poor historian, some confusion  HPI Robin [name] is a 76 y.o. female reports she got up from bed and fell  Patient reports that she got up from bed, she didn't think she passed out and hit the floor. She reports tenderness across the forehead. Reports she feels weak all over. Denies any trouble speaking, denies any headache, no neck pain. No nausea or vomiting.  Does report some recent medicine changes but can't tell what they are.   Past Medical History:  Diagnosis Date  . Afib (HCC)   . Anxiety   . Asthma   . CHF (congestive heart failure) (HCC)   . Coronary artery disease   . Diabetes mellitus without complication (HCC)   . Hyperlipidemia   . Hypertension     Patient Active Problem List   Diagnosis Date Noted  . Palliative care by specialist   . Goals of care, counseling/discussion   . Abdominal pain 06/07/2016  . Transaminitis   . Atrial fibrillation with RVR (HCC) 05/03/2016  . Demand ischemia (HCC) 05/03/2016  . Moderate aortic stenosis 05/03/2016  . Moderate mitral regurgitation 05/03/2016  . History of noncompliance with medical treatment 05/03/2016  . Congestive dilated cardiomyopathy (HCC) 05/03/2016  . Chest pain 05/02/2016  . 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 Renal artery stenosis (HCC) 02/28/2016  . Accelerated hypertension 02/28/2016  . Mild dementia 02/28/2016  . Noncompliance 02/07/2016  . Renal infarct (HCC) 02/06/2016  . Atrial fibrillation with rapid ventricular response (HCC) 02/06/2016  . A-fib (HCC) 01/24/2016  . HTN (hypertension) 11/04/2015  . HLD (hyperlipidemia) 11/04/2015  . CAD (coronary  artery disease) 11/04/2015  . Acute on chronic systolic CHF (congestive heart failure) (HCC) 11/04/2015  . Diabetes (HCC) 11/04/2015    Past Surgical History:  Procedure Laterality Date  . CORONARY ARTERY BYPASS GRAFT    . EYE SURGERY    . NO PAST SURGERIES    . PERIPHERAL VASCULAR CATHETERIZATION Left 03/02/2016   Procedure: Renal Intervention;  Surgeon: Renford Dills, MD;  Location: [facility] INVASIVE CV LAB;  Service: Cardiovascular;  Laterality: Left;    Prior to Admission medications   Medication Sig Start Date End Date Taking? Authorizing Provider  amiodarone (PACERONE) 400 MG tablet Take 400 mg by mouth daily. 07/20/16  Yes [provider]  apixaban (ELIQUIS) 5 MG TABS tablet Take 5 mg by mouth 2 (two) times daily. 07/04/16 07/03/17 Yes [provider]  atorvastatin (LIPITOR) 40 MG tablet Take 40 mg by mouth daily.   Yes [provider]  diltiazem (CARDIZEM CD) 180 MG 24 hr capsule Take 1 capsule (180 mg total) by mouth daily. 03/05/16  Yes Enedina Finner, MD  furosemide (LASIX) 20 MG tablet 40 mg in the morning and 20 mg in the vening 08/03/16  Yes Enedina Finner, MD  glipiZIDE (GLUCOTROL) 5 MG tablet Take 0.5 tablets (2.5 mg total) by mouth daily before breakfast. 03/06/16  Yes Enedina Finner, MD  hydrALAZINE (APRESOLINE) 25 MG tablet Take 75 mg by mouth 3 (three) times daily.   Yes [provider]  lisinopril (PRINIVIL,ZESTRIL) 40 MG tablet Take 40 mg by mouth daily.   Yes [provider]  metoprolol succinate (TOPROL-XL) 100  MG 24 hr tablet Take 100 mg by mouth daily.   Yes [provider]  albuterol (ACCUNEB) 1.25 MG/3ML nebulizer solution Take 1.25 mg by nebulization every 4 (four) hours as needed for wheezing or shortness of breath.     [provider]  albuterol (PROVENTIL HFA;VENTOLIN HFA) 108 (90 Base) MCG/ACT inhaler Inhale 2 puffs into the lungs.    [provider]  atorvastatin (LIPITOR) 80 MG tablet Take 1  tablet (80 mg total) by mouth daily at 6 PM. Patient not taking: Reported on 07/27/2016 03/05/16   Enedina Finner, MD  famotidine (PEPCID) 20 MG tablet Take 20 mg by mouth 2 (two) times daily as needed. 07/03/16 08/02/16  [provider]  feeding supplement, GLUCERNA SHAKE, (GLUCERNA SHAKE) LIQD Take 237 mLs by mouth 3 (three) times daily between meals. 08/02/16   Enedina Finner, MD  lisinopril (PRINIVIL,ZESTRIL) 10 MG tablet Take 1 tablet (10 mg total) by mouth daily. Patient not taking: Reported on 10/19/2016 08/03/16 09/02/16  Enedina Finner, MD  lisinopril (PRINIVIL,ZESTRIL) 40 MG tablet Take 40 mg by mouth daily. 07/03/16 08/02/16  [provider]  LORazepam (ATIVAN) 1 MG tablet Take 1 tablet (1 mg total) by mouth 2 (two) times daily. Patient not taking: Reported on 10/19/2016 05/05/16 05/05/17  Emily Filbert, MD  meclizine (ANTIVERT) 25 MG tablet Take 1 tablet (25 mg total) by mouth 3 (three) times daily as needed for dizziness. Patient not taking: Reported on 10/19/2016 11/06/15   Enid Baas, MD  mometasone-formoterol [facility]) 200-5 MCG/ACT AERO Inhale 2 puffs into the lungs 2 (two) times daily. Patient not taking: Reported on 07/27/2016 03/05/16   Enedina Finner, MD  nitroGLYCERIN (NITROSTAT) 0.4 MG SL tablet Place 1 tablet (0.4 mg total) under the tongue every 5 (five) minutes as needed for chest pain. 01/25/16   Adrian Saran, MD  ondansetron (ZOFRAN ODT) 4 MG disintegrating tablet Allow 1-2 tablets to dissolve in your mouth every 8 hours as needed for nausea/vomiting Patient not taking: Reported on 10/19/2016 06/14/16   Loleta Rose, MD  pantoprazole (PROTONIX) 40 MG tablet Take 1 tablet (40 mg total) by mouth daily. Patient not taking: Reported on 07/27/2016 11/06/15   Enid Baas, MD  potassium chloride SA (K-DUR,KLOR-CON) 20 MEQ tablet Take 1 tablet (20 mEq total) by mouth daily. Patient not taking: Reported on 10/19/2016 08/03/16   Enedina Finner, MD    Allergies Morphine and  related  Family History  Problem Relation Age of Onset  . Hypertension Mother   . Heart failure Mother   . Heart disease Father     Social History Social History  Substance Use Topics  . Smoking status: Never Smoker  . Smokeless tobacco: Never Used  . Alcohol use No    Review of Systems Denies numbness or weakness in arm or leg. No trouble speaking. No chest pain. No trouble breathing. Does report her skin feels "cold"  ____________________________________________   PHYSICAL EXAM:  VITAL SIGNS: ED Triage Vitals  Enc Vitals Group     BP 10/19/16 0956 128/62     Pulse Rate 10/19/16 0956 (!) 34     Resp 10/19/16 0956 18     Temp --      Temp src --      SpO2 10/19/16 0956 100 %     Weight 10/19/16 0959 115 lb (52.2 kg)     Height 10/19/16 0959 5\' 4"  (1.626 m)     Head Circumference --      Peak  Flow --      Pain Score 10/19/16 0955 9     Pain Loc --      Pain Edu? --      Excl. in GC? --     Constitutional: Alert and orientedTo place and name and situation but not to year. Slightly ill-appearing but in no distress Eyes: Conjunctivae are normal. Head: Atraumatic. Nose: No congestion/rhinnorhea. Mouth/Throat: Mucous membranes are moist. Neck: No stridor.  No midline cervical tenderness Cardiovascular: Very bradycardic rate, irregular rhythm. Grossly normal heart sounds.  Good peripheral circulation. Respiratory: Normal respiratory effort.  No retractions. Lungs CTAB. Gastrointestinal: Soft and nontender. No distention. Musculoskeletal: No lower extremity tenderness nor edema. Some bruising noted to the hands bilaterally but uses well without deficit or focal discomfort on palpation Neurologic:  Normal speech and language. No gross focal neurologic deficits are appreciated.  Skin:  Skin is warm, dry and intact. No rash noted. Psychiatric: Mood and affect are calm, slightly flat  ____________________________________________   LABS (all labs ordered are listed, but  only abnormal results are displayed)  Labs Reviewed  TROPONIN I - Abnormal; Notable for the following:       Result Value   Troponin I 0.03 (*)    All other components within normal limits  CBC WITH DIFFERENTIAL/PLATELET - Abnormal; Notable for the following:    RBC 3.54 (*)    Hemoglobin 8.8 (*)    HCT 29.0 (*)    MCH 24.9 (*)    MCHC 30.3 (*)    RDW 22.2 (*)    Neutro Abs 8.6 (*)    All other components within normal limits  COMPREHENSIVE METABOLIC PANEL - Abnormal; Notable for the following:    Chloride 114 (*)    CO2 20 (*)    Glucose, Bld 218 (*)    BUN 26 (*)    Creatinine, Ser 1.58 (*)    Calcium 8.3 (*)    Total Protein 6.2 (*)    Albumin 3.3 (*)    Alkaline Phosphatase 147 (*)    GFR calc non Af Amer 31 (*)    GFR calc Af Amer 36 (*)    All other components within normal limits  APTT  PROTIME-INR  DIGOXIN LEVEL  LACTIC ACID, PLASMA  LACTIC ACID, PLASMA  CBG MONITORING, ED   ____________________________________________  EKG  Reviewed and interpreted by me Heart rate 25 Probable atrial fibrillation, junctional bradycardia ____________________________________________  RADIOLOGY  Ct Head Wo Contrast  Result Date: 10/19/2016 CLINICAL DATA:  Maxillofacial blunt trauma. Cataract surgery yesterday and fall this morning. Bradycardia. Initial encounter. EXAM: CT HEAD WITHOUT CONTRAST CT CERVICAL SPINE WITHOUT CONTRAST TECHNIQUE: Multidetector CT imaging of the head and cervical spine was performed following the standard protocol without intravenous contrast. Multiplanar CT image reconstructions of the cervical spine were also generated. COMPARISON:  06/07/2016 head CT. FINDINGS: CT HEAD FINDINGS Brain: No evidence of acute infarction, hemorrhage, hydrocephalus, extra-axial collection or mass lesion/mass effect. 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 Chronic microvascular ischemic change in the  cerebral white matter, moderate. Vascular: Atherosclerotic calcification.  No hyperdense vessel. Skull: Soft tissue contusion over the left temporal fossa and right forehead. No underlying fracture. Sinuses/Orbits: Bilateral cataract resection, with surgery reportedly yesterday. No evidence of postseptal injury or swelling. CT CERVICAL SPINE FINDINGS Alignment: No malalignment. Skull base and vertebrae: Negative for acute fracture. Soft tissues and spinal canal: No prevertebral fluid or swelling. No visible canal hematoma. Disc levels: C6-7  predominant disc degeneration with narrowing and asymmetric left uncovertebral spurring and foraminal narrowing. Upper cervical facet spurring. Upper chest: There are layering pleural effusions and interlobular septal thickening at the apices. IMPRESSION: 1. No evidence of acute intracranial or cervical spine injury. 2. Facial contusion without fracture. 3. Layering pleural effusions and probable interstitial edema at the apices. 4. 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 Chronic small vessel ischemia. Electronically Signed   By: Marnee Spring M.D.   On: 10/19/2016 11:03   Ct Cervical Spine Wo Contrast  Result Date: 10/19/2016 CLINICAL DATA:  Maxillofacial blunt trauma. Cataract surgery yesterday and fall this morning. Bradycardia. Initial encounter. EXAM: CT HEAD WITHOUT CONTRAST CT CERVICAL SPINE WITHOUT CONTRAST TECHNIQUE: Multidetector CT imaging of the head and cervical spine was performed following the standard protocol without intravenous contrast. Multiplanar CT image reconstructions of the cervical spine were also generated. COMPARISON:  06/07/2016 head CT. FINDINGS: CT HEAD FINDINGS Brain: No evidence of acute infarction, hemorrhage, hydrocephalus, extra-axial collection or mass lesion/mass effect. Chronic appearing infarct in the right occipital and parietal lobes, roughly posterior watershed  distribution. Chronic microvascular ischemic change in the cerebral white matter, moderate. Vascular: Atherosclerotic calcification.  No hyperdense vessel. Skull: Soft tissue contusion over the left temporal fossa and right forehead. No underlying fracture. Sinuses/Orbits: Bilateral cataract resection, with surgery reportedly yesterday. No evidence of postseptal injury or swelling. CT CERVICAL SPINE FINDINGS Alignment: No malalignment. Skull base and vertebrae: Negative for acute fracture. Soft tissues and spinal canal: No prevertebral fluid or swelling. No visible canal hematoma. Disc levels: C6-7 predominant disc degeneration with narrowing and asymmetric left uncovertebral spurring and foraminal narrowing. Upper cervical facet spurring. Upper chest: There are layering pleural effusions and interlobular septal thickening at the apices. IMPRESSION: 1. No evidence of acute intracranial or cervical spine injury. 2. Facial contusion without fracture. 3. Layering pleural effusions and probable interstitial edema at the apices. 4. 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 Chronic small vessel ischemia. Electronically Signed   By: Marnee Spring M.D.   On: 10/19/2016 11:03    ____________________________________________   PROCEDURES  Procedure(s) performed: None  Procedures  Critical Care performed: Yes, see critical care note(s)  CRITICAL CARE Performed by: Sharyn Creamer   Total critical care time: 45 minutes  Critical care time was exclusive of separately billable procedures and treating other patients.  Critical care was necessary to treat or prevent imminent or life-threatening deterioration.  Critical care was time spent personally by me on the following activities: development of treatment plan with patient and/or surrogate as well as nursing, discussions with consultants, evaluation of patient's response to treatment, examination of  patient, obtaining history from patient or surrogate, ordering and performing treatments and interventions, ordering and review of laboratory studies, ordering and review of radiographic studies, pulse oximetry and re-evaluation of patient's condition.  ____________________________________________   INITIAL IMPRESSION / ASSESSMENT AND PLAN / ED COURSE  Pertinent labs & imaging results that were available during my care of the patient were reviewed by me and considered in my medical decision making (see chart for details).  Patient presents for syncope, noted to have severe bradycardia. She is on multiple rate control medications including amiodarone, metoprolol, diltiazem. Discussed case with toxicology including toxicology physician. Consultation to cardiology for possible placement of temporary pacing wire  Clinical Course as of Oct 19 1105  [facility] Oct 19, 2016  1610 Request for a bedside consult placed with  Dr. Lady Gary. Request requested as STAT, possible need for a pacing wire if patient decompensates as heart rate is occasionally falling below 30. BP is normal to slight hypertensive. Noted to possibly be on amiodarone, also ? Diltiazem, and has blister package with metoprolol ER.  [MQ]  1042 Patient continues to demonstrate stable hemodynamics with bradycardia but no hypotension. Awaiting CT scan, discussed with Dr. Lady Gary and he has seen and evaluated and is considering taking the patient for temporary wire placement at this time but is not yet certain. He'll be returning in about 30 minutes to reevaluate  [MQ]    Clinical Course User Index [MQ] Sharyn Creamer, MD   1010AM: Discussed with Chales Abrahams at poison control.  No immediate recommendations except give   Vitals:   10/19/16 1030 10/19/16 1057  BP: (!) 137/24 (!) 153/69  Pulse:  (!) 31  Resp:  (!) 2 (error suspected, patient breathing normal rate on my reexam)  SpO2:  100%     ____________________________________________  ----------------------------------------- 11:11 AM on 10/19/2016 -----------------------------------------  Patient resting. No distress. Blood pressure stable mental status stable. Discussed with toxicology, this time continue to monitor the patient closely, if decompensation would consider norepinephrine infusion or potentially high-dose insulin therapy, which do not appear to be acutely indicated at this time. Patient being admitted for further monitoring and cardiology consultation which is ongoing.  FINAL CLINICAL IMPRESSION(S) / ED DIAGNOSES  Final diagnoses:  Syncope and collapse  Junctional bradycardia      NEW MEDICATIONS STARTED DURING THIS VISIT:  New Prescriptions   No medications on file     Note:  This document was prepared using Dragon voice recognition software and may include unintentional dictation errors.     Sharyn Creamer, MD 10/19/16 [phone]

## 2016-10-19 NOTE — ED Notes (Signed)
Pt is warm and dry and alert.  She has been fretful at times but easily calmed.  Awaiting ct to be ready for patient.  Dr Lady Gary has already been in to see patient.  I had place a c collar on patient.  She complained of some nausea, but it passed.  Dr Fanny Bien did not want to give any antinausea med at this time.  Heart rate continues in the 20s.  Irregular with wide complexes.

## 2016-10-19 NOTE — Consult Note (Signed)
[facility] Consultation    SYNOPSIS   76 yo female with syncopal episode due to bradycardia.   ASSESSMENT/PLAN    PULMONARY A: Acute respiratory failure due to pulmonary edema from bradycardia.  -Asthma.  P:   Nebs. Treat bradycardia, diuresis as needed.   VENTILATOR SETTINGS:    CARDIOVASCULAR A: Bradycardia, symptomatic.  P:  Continue to monitor, hold anti-hypertensives as needed.  Hold eliquis due fall.   HEMODYNAMICS:    RENAL A:  AKI secondary to above.  P:   --Gentle hydration.   INTAKE / OUTPUT: No intake or output data in the 24 hours ending 10/19/16 1224  Micro/culture results: MRSA PCR negative.    Antibiotics: --  ENDOCRINE A:  Diabetes mellitus with mild hyperglycemia.  S/p glucagon P:   --Continue SSI.   NEUROLOGIC A:  -- P:   RASS goal: -- --   MAJOR EVENTS/TEST RESULTS: Admitted to ICU 10/19/16  Best Practices  DVT Prophylaxis: hold eliquis. Start SQ heparin.  GI Prophylaxis: --  ---------------------------------------  ---------------------------------------   Name: Cecil Bixby MRN: [id number] DOB: Oct 07, 1940    ADMISSION DATE:  10/19/2016 CONSULTATION DATE: 10/19/2016   REFERRING MD :  Dr. Hilton Sinclair  CHIEF COMPLAINT:  Fall.    HISTORY OF PRESENT ILLNESS:   The patient is a 76 yo female who fell from dizziness. EMS was called and she was found to have bradycardia. She was brought into the ED with HR of 32. Pt is noted to be on amiodarone, cardizem, metoprolol and eliquis. CT head showed remote infarct. I personally reviewed CXR image which shows cardiomegaly.  There is mild AKI with creatinine ot 1.58   PAST MEDICAL HISTORY :  Past Medical History:  Diagnosis Date  . Afib (HCC)   . Anxiety   . Asthma   . CHF (congestive heart failure) (HCC)   . Coronary artery disease   . Diabetes mellitus without complication (HCC)   . Hyperlipidemia   . Hypertension    Past Surgical History:    Procedure Laterality Date  . CORONARY ARTERY BYPASS GRAFT    . EYE SURGERY    . NO PAST SURGERIES    . PERIPHERAL VASCULAR CATHETERIZATION Left 03/02/2016   Procedure: Renal Intervention;  Surgeon: Renford Dills, MD;  Location: [facility];  Service: Cardiovascular;  Laterality: Left;   Prior to Admission medications   Medication Sig Start Date End Date Taking? Authorizing Provider  amiodarone (PACERONE) 400 MG tablet Take 400 mg by mouth daily. 07/20/16  Yes [provider]  apixaban (ELIQUIS) 5 MG TABS tablet Take 5 mg by mouth 2 (two) times daily. 07/04/16 07/03/17 Yes [provider]  atorvastatin (LIPITOR) 40 MG tablet Take 40 mg by mouth daily.   Yes [provider]  diltiazem (CARDIZEM CD) 180 MG 24 hr capsule Take 1 capsule (180 mg total) by mouth daily. 03/05/16  Yes Enedina Finner, MD  furosemide (LASIX) 20 MG tablet 40 mg in the morning and 20 mg in the vening 08/03/16  Yes Enedina Finner, MD  glipiZIDE (GLUCOTROL) 5 MG tablet Take 0.5 tablets (2.5 mg total) by mouth daily before breakfast. 03/06/16  Yes Enedina Finner, MD  hydrALAZINE (APRESOLINE) 25 MG tablet Take 75 mg by mouth 3 (three) times daily.   Yes [provider]  ketorolac (ACULAR) 0.5 % ophthalmic solution Apply 1 drop to eye 4 (four) times daily. Right eye 09/20/16 10/20/16 Yes [provider]  lisinopril (PRINIVIL,ZESTRIL) 40 MG tablet  Take 40 mg by mouth daily.   Yes [provider]  metoprolol succinate (TOPROL-XL) 100 MG 24 hr tablet Take 100 mg by mouth daily.   Yes [provider]  ofloxacin (OCUFLOX) 0.3 % ophthalmic solution Place 1 drop into the left eye 4 (four) times daily.   Yes [provider]  prednisoLONE acetate (PRED FORTE) 1 % ophthalmic suspension Apply 1 drop to eye 4 (four) times daily. Left eye 10/17/16 11/16/16 Yes [provider]  albuterol (ACCUNEB) 1.25 MG/3ML nebulizer solution Take 1.25 mg by nebulization every 4  (four) hours as needed for wheezing or shortness of breath.     [provider]  albuterol (PROVENTIL HFA;VENTOLIN HFA) 108 (90 Base) MCG/ACT inhaler Inhale 2 puffs into the lungs.    [provider]  famotidine (PEPCID) 20 MG tablet Take 20 mg by mouth 2 (two) times daily as needed. 07/03/16 08/02/16  [provider]  feeding supplement, GLUCERNA SHAKE, (GLUCERNA SHAKE) LIQD Take 237 mLs by mouth 3 (three) times daily between meals. 08/02/16   Enedina Finner, MD  lisinopril (PRINIVIL,ZESTRIL) 10 MG tablet Take 1 tablet (10 mg total) by mouth daily. Patient not taking: Reported on 10/19/2016 08/03/16 09/02/16  Enedina Finner, MD  lisinopril (PRINIVIL,ZESTRIL) 40 MG tablet Take 40 mg by mouth daily. 07/03/16 08/02/16  [provider]  nitroGLYCERIN (NITROSTAT) 0.4 MG SL tablet Place 1 tablet (0.4 mg total) under the tongue every 5 (five) minutes as needed for chest pain. 01/25/16   Adrian Saran, MD   Allergies  Allergen Reactions  . Morphine And Related Other (See Comments)    Patient states it makes her "space out"    FAMILY HISTORY:  Family History  Problem Relation Age of Onset  . Hypertension Mother   . Heart failure Mother   . Heart disease Father    SOCIAL HISTORY:  reports that she has never smoked. She has never used smokeless tobacco. She reports that she does not drink alcohol or use drugs.  REVIEW OF SYSTEMS:   Pt denies chest pain, tachycardia, dyspnea. Remainder of ROS was reviewed and was negative.    VITAL SIGNS: Pulse Rate:  [31-47] 47 (08/10 1200) Resp:  [2-21] 21 (08/10 1200) BP: (128-167)/(24-69) 167/52 (08/10 1200) SpO2:  [99 %-100 %] 100 % (08/10 1200) Weight:  [115 lb (52.2 kg)] 115 lb (52.2 kg) (08/10 0959) HEMODYNAMICS:   VENTILATOR SETTINGS:   INTAKE / OUTPUT: No intake or output data in the 24 hours ending 10/19/16 1224  Physical Examination:   VS: BP (!) 167/52   Pulse (!) 47   Resp (!) 21   Ht 5\' 4"  (1.626 m)   Wt 115 lb  (52.2 kg)   LMP  (LMP Unknown)   SpO2 100%   BMI 19.74 kg/m   General Appearance: No distress  Neuro:without focal findings, mental status, speech normal,. HEENT: PERRLA, EOM intact, forehead and facial abrasions.  Pulmonary: normal breath sounds., diaphragmatic excursion normal. CardiovascularNormal S1,S2.  No m/r/g.    Abdomen: Benign, Soft, non-tender, No masses, hepatosplenomegaly, No lymphadenopathy Renal:  No costovertebral tenderness  GU:  Not performed at this time. Endoc: No evident thyromegaly, no signs of acromegaly. Skin:   warm, no rashes, no ecchymosis  Extremities: normal, no cyanosis, clubbing, no edema, warm with normal capillary refill.    LABS: Reviewed   LABORATORY PANEL:   CBC  Recent Labs Lab 10/19/16 0958  WBC 10.8  HGB 8.8*  HCT 29.0*  PLT 339  Chemistries   Recent Labs Lab 10/19/16 0958  NA 143  K 4.3  CL 114*  CO2 20*  GLUCOSE 218*  BUN 26*  CREATININE 1.58*  CALCIUM 8.3*  AST 21  ALT 17  ALKPHOS 147*  BILITOT 1.0    No results for input(s): GLUCAP in the last 168 hours. No results for input(s): PHART, PCO2ART, PO2ART in the last 168 hours.  Recent Labs Lab 10/19/16 0958  AST 21  ALT 17  ALKPHOS 147*  BILITOT 1.0  ALBUMIN 3.3*    Cardiac Enzymes  Recent Labs Lab 10/19/16 0958  TROPONINI 0.03*    RADIOLOGY:  Ct Head Wo Contrast  Result Date: 10/19/2016 CLINICAL DATA:  Maxillofacial blunt trauma. Cataract surgery yesterday and fall this morning. Bradycardia. Initial encounter. EXAM: CT HEAD WITHOUT CONTRAST CT CERVICAL SPINE WITHOUT CONTRAST TECHNIQUE: Multidetector CT imaging of the head and cervical spine was performed following the standard protocol without intravenous contrast. Multiplanar CT image reconstructions of the cervical spine were also generated. COMPARISON:  06/07/2016 head CT. FINDINGS: CT HEAD FINDINGS Brain: No evidence of acute infarction, hemorrhage, hydrocephalus, extra-axial collection or  mass lesion/mass effect. 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 Chronic microvascular ischemic change in the cerebral white matter, moderate. Vascular: Atherosclerotic calcification.  No hyperdense vessel. Skull: Soft tissue contusion over the left temporal fossa and right forehead. No underlying fracture. Sinuses/Orbits: Bilateral cataract resection, with surgery reportedly yesterday. No evidence of postseptal injury or swelling. CT CERVICAL SPINE FINDINGS Alignment: No malalignment. Skull base and vertebrae: Negative for acute fracture. Soft tissues and spinal canal: No prevertebral fluid or swelling. No visible canal hematoma. Disc levels: C6-7 predominant disc degeneration with narrowing and asymmetric left uncovertebral spurring and foraminal narrowing. Upper cervical facet spurring. Upper chest: There are layering pleural effusions and interlobular septal thickening at the apices. IMPRESSION: 1. No evidence of acute intracranial or cervical spine injury. 2. Facial contusion without fracture. 3. Layering pleural effusions and probable interstitial edema at the apices. 4. 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 Chronic small vessel ischemia. Electronically Signed   By: Marnee Spring M.D.   On: 10/19/2016 11:03   Ct Cervical Spine Wo Contrast  Result Date: 10/19/2016 CLINICAL DATA:  Maxillofacial blunt trauma. Cataract surgery yesterday and fall this morning. Bradycardia. Initial encounter. EXAM: CT HEAD WITHOUT CONTRAST CT CERVICAL SPINE WITHOUT CONTRAST TECHNIQUE: Multidetector CT imaging of the head and cervical spine was performed following the standard protocol without intravenous contrast. Multiplanar CT image reconstructions of the cervical spine were also generated. COMPARISON:  06/07/2016 head CT. FINDINGS: CT HEAD FINDINGS Brain: No evidence of acute infarction,  hemorrhage, hydrocephalus, extra-axial collection or mass lesion/mass effect. 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 Chronic microvascular ischemic change in the cerebral white matter, moderate. Vascular: Atherosclerotic calcification.  No hyperdense vessel. Skull: Soft tissue contusion over the left temporal fossa and right forehead. No underlying fracture. Sinuses/Orbits: Bilateral cataract resection, with surgery reportedly yesterday. No evidence of postseptal injury or swelling. CT CERVICAL SPINE FINDINGS Alignment: No malalignment. Skull base and vertebrae: Negative for acute fracture. Soft tissues and spinal canal: No prevertebral fluid or swelling. No visible canal hematoma. Disc levels: C6-7 predominant disc degeneration with narrowing and asymmetric left uncovertebral spurring and foraminal narrowing. Upper cervical facet spurring. Upper chest: There are layering pleural effusions and interlobular septal thickening at the apices. IMPRESSION: 1. No evidence of  acute intracranial or cervical spine injury. 2. Facial contusion without fracture. 3. Layering pleural effusions and probable interstitial edema at the apices. 4. 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 Chronic small vessel ischemia. Electronically Signed   By: Marnee Spring M.D.   On: 10/19/2016 11:03   Dg Chest Portable 1 View  Result Date: 10/19/2016 CLINICAL DATA:  76 year old female status post fall with bradycardia. EXAM: PORTABLE CHEST 1 VIEW COMPARISON:  07/31/2016 and earlier. FINDINGS: Portable AP semi upright view at 1119 hours. Previous CABG. Resuscitation or pacer pads project over the left chest. Moderate to severe cardiomegaly. Mediastinal contours are stable. Pulmonary vascularity has decreased compared to May. No overt edema. Improved bibasilar ventilation, previous small pleural effusions appear  resolved. No pneumothorax, pleural effusion or confluent pulmonary opacity. IMPRESSION: 1. Stable moderate to severe cardiomegaly. 2. Resolved pulmonary edema and small pleural effusions since 07/31/2016. No acute cardiopulmonary abnormality. Electronically Signed   By: Odessa Fleming M.D.   On: 10/19/2016 11:51       --Deep Nicholos Johns, MD.  Board Certified in Internal Medicine, Pulmonary Medicine, Critical Care Medicine, and Sleep Medicine.  ICU Pager [phone] [facility] Office Number: [phone]  Santiago Glad, M.D.  Billy Fischer, M.D   10/19/2016, 12:24 PM

## 2016-10-19 NOTE — ED Notes (Signed)
Has been to ct and back and no change in contdition.  Hr 20s and 30s irreg.  ccollar still inplace.

## 2016-10-19 NOTE — ED Notes (Signed)
Informed RN bed ready   1256

## 2016-10-20 LAB — CBC
HCT: 25.8 % — ABNORMAL LOW (ref 35.0–47.0)
Hemoglobin: 7.7 g/dL — ABNORMAL LOW (ref 12.0–16.0)
MCH: 24.1 pg — ABNORMAL LOW (ref 26.0–34.0)
MCHC: 30 g/dL — ABNORMAL LOW (ref 32.0–36.0)
MCV: 80.5 fL (ref 80.0–100.0)
PLATELETS: 329 10*3/uL (ref 150–440)
RBC: 3.2 MIL/uL — ABNORMAL LOW (ref 3.80–5.20)
RDW: 21.6 % — AB (ref 11.5–14.5)
WBC: 10 10*3/uL (ref 3.6–11.0)

## 2016-10-20 LAB — GLUCOSE, CAPILLARY
GLUCOSE-CAPILLARY: 74 mg/dL (ref 65–99)
Glucose-Capillary: 117 mg/dL — ABNORMAL HIGH (ref 65–99)
Glucose-Capillary: 151 mg/dL — ABNORMAL HIGH (ref 65–99)
Glucose-Capillary: 169 mg/dL — ABNORMAL HIGH (ref 65–99)

## 2016-10-20 LAB — PHOSPHORUS: PHOSPHORUS: 4 mg/dL (ref 2.5–4.6)

## 2016-10-20 LAB — BASIC METABOLIC PANEL
ANION GAP: 8 (ref 5–15)
BUN: 29 mg/dL — ABNORMAL HIGH (ref 6–20)
CALCIUM: 8.5 mg/dL — AB (ref 8.9–10.3)
CO2: 22 mmol/L (ref 22–32)
CREATININE: 1.56 mg/dL — AB (ref 0.44–1.00)
Chloride: 115 mmol/L — ABNORMAL HIGH (ref 101–111)
GFR, EST AFRICAN AMERICAN: 36 mL/min — AB (ref >60)
GFR, EST NON AFRICAN AMERICAN: 31 mL/min — AB (ref >60)
GLUCOSE: 85 mg/dL (ref 65–99)
Potassium: 4.2 mmol/L (ref 3.5–5.1)
Sodium: 145 mmol/L (ref 135–145)

## 2016-10-20 LAB — DIGOXIN LEVEL: Digoxin Level: <0.2 ng/mL — ABNORMAL LOW (ref 0.8–2.0)

## 2016-10-20 LAB — MAGNESIUM: Magnesium: 1.8 mg/dL (ref 1.7–2.4)

## 2016-10-20 MED ORDER — HYDRALAZINE HCL 20 MG/ML IJ SOLN: 10.0000 mg | Freq: Four times a day (QID) | INTRAMUSCULAR | Status: DC | PRN

## 2016-10-20 MED ORDER — ALPRAZOLAM 0.5 MG PO TABS
0.5000 mg | ORAL_TABLET | Freq: Once | ORAL | Status: AC
  Administered 2016-10-21: 0.5 mg via ORAL
  Filled 2016-10-20: qty 1

## 2016-10-20 NOTE — Care Management Obs Status (Signed)
MEDICARE OBSERVATION STATUS NOTIFICATION   Patient Details  Name: Robin [name] MRN: [id number] Date of Birth: 14-May-1940   Medicare Observation Status Notification Given:  Yes    Joleigh Mineau A, RN 10/20/2016, 2:59 PM

## 2016-10-20 NOTE — Progress Notes (Signed)
Pt alert and oriented.  Pt now sinus rhythm and on room air.  Pt's home medications have been sent to pharmacy, tag receipt in chart.

## 2016-10-20 NOTE — Progress Notes (Signed)
[facility]  SUBJECTIVE: Feels better. Some forehead pain. HR improved. Oriented to person and place.    Vitals:   10/20/16 0500 10/20/16 0600 10/20/16 0800 10/20/16 0900  BP: (!) 140/55 (!) 163/47 (!) 166/52 (!) 162/70  Pulse: 64 68 65 66  Resp: (!) 21 17 13 13   Temp: 98 F (36.7 C)     TempSrc: Oral     SpO2: 95% 98% 96% 96%  Weight: 68.9 kg (151 lb 14.4 oz)     Height:        Intake/Output Summary (Last 24 hours) at 10/20/16 0927 Last data filed at 10/20/16 0300  Gross per 24 hour  Intake              755 ml  Output              611 ml  Net              144 ml    LABS: Basic Metabolic Panel:  Recent Labs  69/62/95 0958 10/20/16 0541  NA 143 145  K 4.3 4.2  CL 114* 115*  CO2 20* 22  GLUCOSE 218* 85  BUN 26* 29*  CREATININE 1.58* 1.56*  CALCIUM 8.3* 8.5*  MG  --  1.8  PHOS  --  4.0   Liver Function Tests:  Recent Labs  10/19/16 0958  AST 21  ALT 17  ALKPHOS 147*  BILITOT 1.0  PROT 6.2*  ALBUMIN 3.3*   No results for input(s): LIPASE, AMYLASE in the last 72 hours. CBC:  Recent Labs  10/19/16 0958 10/20/16 0541  WBC 10.8 10.0  NEUTROABS 8.6*  --   HGB 8.8* 7.7*  HCT 29.0* 25.8*  MCV 82.0 80.5  PLT 339 329   Cardiac Enzymes:  Recent Labs  10/19/16 0958 10/19/16 1347 10/19/16 1757  TROPONINI 0.03* 0.03* 0.03*   BNP: Invalid input(s): POCBNP D-Dimer: No results for input(s): DDIMER in the last 72 hours. Hemoglobin A1C: No results for input(s): HGBA1C in the last 72 hours. Fasting Lipid Panel: No results for input(s): CHOL, HDL, LDLCALC, TRIG, CHOLHDL, LDLDIRECT in the last 72 hours. Thyroid Function Tests: No results for input(s): TSH, T4TOTAL, T3FREE, THYROIDAB in the last 72 hours.  Invalid input(s): FREET3 Anemia Panel: No results for input(s): VITAMINB12, FOLATE, FERRITIN, TIBC, IRON, RETICCTPCT in the last 72 hours.   Physical Exam: Blood pressure (!) 162/70, pulse 66,  temperature 98 F (36.7 C), temperature source Oral, resp. rate 13, height 5\' 4"  (1.626 m), weight 68.9 kg (151 lb 14.4 oz), SpO2 96 %.   Wt Readings from Last 1 Encounters:  10/20/16 68.9 kg (151 lb 14.4 oz)     General appearance: alert and cooperative Head: contusion on forehead.  Resp: clear to auscultation bilaterally Cardio: regular rate and rhythm GI: soft, non-tender; bowel sounds normal; no masses,  no organomegaly Extremities: extremities normal, atraumatic, no cyanosis or edema Neurologic: Grossly normal  TELEMETRY: Reviewed telemetry pt in nsr:  ASSESSMENT AND PLAN:  Active Problems:   Bradycardia-no further bradycardia. Heart rate noted on presentation and likely the cause of her syncope has improved with stopping av nodal drugs. Will need to follow for break through tachycardia as drugs wash out and may need to resume amiodarone back. Will need to consider resuming Eliquis in the next 24 hours. Pt may have over dosed due to misunderstanding of dosing. OK to transfer to telemetry.   CAD-no ischemia  Dalia Heading, MD, [facility] 10/20/2016 9:27 AM

## 2016-10-20 NOTE — Progress Notes (Signed)
Dr. Anne Hahn paged, new orders for EKG and troponin STAT

## 2016-10-20 NOTE — Progress Notes (Addendum)
[facility] [place] [facility]   PATIENT NAME: Zaydah Nawabi    MR#:  [id number]  DATE OF BIRTH:  [birth year]  SUBJECTIVE:  CHIEF COMPLAINT:   Chief Complaint  Patient presents with  . Fall  . Bradycardia    REVIEW OF SYSTEMS:  Review of Systems  Constitutional: Positive for malaise/fatigue. Negative for chills and fever.  HENT: Negative for sore throat.   Eyes: Negative for blurred vision and double vision.  Respiratory: Negative for cough, hemoptysis, shortness of breath, wheezing and stridor.   Cardiovascular: Negative for chest pain, palpitations, orthopnea and leg swelling.  Gastrointestinal: Negative for abdominal pain, blood in stool, diarrhea, melena, nausea and vomiting.  Genitourinary: Negative for dysuria, flank pain and hematuria.  Musculoskeletal: Negative for back pain and joint pain.  Neurological: Positive for weakness. Negative for dizziness, sensory change, focal weakness, seizures, loss of consciousness and headaches.  Endo/Heme/Allergies: Negative for polydipsia.  Psychiatric/Behavioral: Negative for depression. The patient is not nervous/anxious.     DRUG ALLERGIES:   Allergies  Allergen Reactions  . Morphine And Related Other (See Comments)    Patient states it makes her "space out"   VITALS:  Blood pressure (!) 151/51, pulse 71, temperature 97.7 F (36.5 C), temperature source Oral, resp. rate 16, height  (1.626 m), weight 151 lb 14.4 oz (68.9 kg), SpO2 98 %. PHYSICAL EXAMINATION:  Physical Exam  Constitutional: She is oriented to person, place, and time and well-developed, well-nourished, and in no distress.  HENT:  Head: Normocephalic.  Mouth/Throat: Oropharynx is clear and moist.  Eyes: Pupils are equal, round, and reactive to light. Conjunctivae and EOM are normal. No scleral icterus.  Neck: Normal range of motion. Neck supple. No JVD present. No tracheal deviation present.  Cardiovascular: Normal rate, regular  rhythm and normal heart sounds.  Exam reveals no gallop.   No murmur heard. Pulmonary/Chest: Effort normal and breath sounds normal. No respiratory distress. She has no wheezes. She has no rales.  Abdominal: Soft. Bowel sounds are normal. She exhibits no distension. There is no tenderness. There is no rebound.  Musculoskeletal: Normal range of motion. She exhibits no edema or tenderness.  Neurological: She is alert and oriented to person, place, and time. No cranial nerve deficit.  Skin: No rash noted. No erythema.  Psychiatric: Affect normal.   LABORATORY PANEL:  Female CBC  Recent Labs Lab 10/20/16 0541  WBC 10.0  HGB 7.7*  HCT 25.8*  PLT 329   ------------------------------------------------------------------------------------------------------------------ Chemistries   Recent Labs Lab 10/19/16 0958 10/20/16 0541  NA 143 145  K 4.3 4.2  CL 114* 115*  CO2 20* 22  GLUCOSE 218* 85  BUN 26* 29*  CREATININE 1.58* 1.56*  CALCIUM 8.3* 8.5*  MG  --  1.8  AST 21  --   ALT 17  --   ALKPHOS 147*  --   BILITOT 1.0  --    RADIOLOGY:  No results found. ASSESSMENT AND PLAN:   1. Severe bradycardia with junctional rhythm with syncopal episode.  Improved after discontinuing amiodarone, Lopressor and Cardizem. Per Dr. Lady Gary, resume Eliquis in the next 24 hours and may need to resume amiodarone back.   2. Acute kidney injury on chronic kidney disease stage III. Gentle IV fluid hydration, follow-up BMP.  3. Essential hypertension. With the patient's bradycardia, hold all home antihypertensive medications at this point. IV hydralazine when necessary.  4. Head trauma on Eliquis. Stable. 5. Chronic Atrial fibrillation history. resume Eliquis in  the next 24 hours and may need to resume amiodarone back.   6. Lactic acidosis.  IV fluid hydration, follow-up lactic acid. 7. Anemia of chronic disease. F/u Hb. 8. Type 2 diabetes mellitus put on sliding scale and hold oral diabetic  medications 9. Hyperlipidemia unspecified on atorvastatin 10. Chronic systolic CHF with ejection fraction 15%.  No signs of congestive heart failure at this point. Lasix, Lopressor and lisinopril are on hold due to ARF on CKD and bradycardia.  Generalized weakness. PT evaluation. I discussed with Dr. Lady Gary. All the records are reviewed and case discussed with Care Management/Social Worker. Management plans discussed with the patient, family and they are in agreement.  CODE STATUS: Full Code  TOTAL TIME TAKING CARE OF THIS PATIENT: 36 minutes.   More than 50% of the time was spent in counseling/coordination of care: YES  POSSIBLE D/C IN 1-2 DAYS, DEPENDING ON CLINICAL CONDITION.   Shaune Pollack M.D on 10/20/2016 at 3:48 PM  Between 7am to 6pm - Pager - [phone]  After 6pm go to [email or web address] - Social research officer, government  [facility]  Office  [phone]  CC: Primary care physician; Leanna Sato, MD  Note: This dictation was prepared with Dragon dictation along with smaller phrase technology. Any transcriptional errors that result from this process are unintentional.

## 2016-10-20 NOTE — Progress Notes (Signed)
Pt. C/o mid sternal chest pain, sharp in nature. VSS. Sub-lingual nitro given.

## 2016-10-20 NOTE — Care Management CC44 (Signed)
Condition Code 44 Documentation Completed  Patient Details  Name: Robin [name] MRN: [id number] Date of Birth: [birth year]   Condition Code 44 given:  Yes Patient signature on Condition Code 44 notice:  Yes Documentation of 2 MD's agreement:  Yes Code 44 added to claim:  Yes    Aelyn Stanaland A, RN 10/20/2016, 3:00 PM

## 2016-10-21 LAB — LACTIC ACID, PLASMA: Lactic Acid, Venous: 1.8 mmol/L (ref 0.5–1.9)

## 2016-10-21 LAB — BASIC METABOLIC PANEL
ANION GAP: 8 (ref 5–15)
BUN: 29 mg/dL — ABNORMAL HIGH (ref 6–20)
CHLORIDE: 113 mmol/L — AB (ref 101–111)
CO2: 22 mmol/L (ref 22–32)
Calcium: 8.6 mg/dL — ABNORMAL LOW (ref 8.9–10.3)
Creatinine, Ser: 1.79 mg/dL — ABNORMAL HIGH (ref 0.44–1.00)
GFR calc Af Amer: 31 mL/min — ABNORMAL LOW (ref >60)
GFR, EST NON AFRICAN AMERICAN: 26 mL/min — AB (ref >60)
GLUCOSE: 136 mg/dL — AB (ref 65–99)
POTASSIUM: 4.3 mmol/L (ref 3.5–5.1)
Sodium: 143 mmol/L (ref 135–145)

## 2016-10-21 LAB — HEMOGLOBIN A1C
Hgb A1c MFr Bld: 6.4 % — ABNORMAL HIGH (ref 4.8–5.6)
Mean Plasma Glucose: 137 mg/dL

## 2016-10-21 LAB — GLUCOSE, CAPILLARY
GLUCOSE-CAPILLARY: 80 mg/dL (ref 65–99)
GLUCOSE-CAPILLARY: 78 mg/dL (ref 65–99)
GLUCOSE-CAPILLARY: 117 mg/dL — AB (ref 65–99)
Glucose-Capillary: 87 mg/dL (ref 65–99)

## 2016-10-21 LAB — TROPONIN I
Troponin I: 0.03 ng/mL (ref <0.03)
Troponin I: <0.03 ng/mL (ref <0.03)

## 2016-10-21 LAB — HEMOGLOBIN: Hemoglobin: 8 g/dL — ABNORMAL LOW (ref 12.0–16.0)

## 2016-10-21 MED ORDER — METOPROLOL TARTRATE 5 MG/5ML IV SOLN
2.5000 mg | Freq: Once | INTRAVENOUS | Status: AC
  Administered 2016-10-21: 2.5 mg via INTRAVENOUS
  Filled 2016-10-21: qty 5

## 2016-10-21 MED ORDER — AMIODARONE HCL 200 MG PO TABS
200.0000 mg | ORAL_TABLET | Freq: Every day | ORAL | Status: DC
  Administered 2016-10-21: 200 mg via ORAL
  Filled 2016-10-21: qty 1

## 2016-10-21 MED ORDER — SODIUM CHLORIDE 0.9 % IV SOLN
INTRAVENOUS | Status: DC
  Administered 2016-10-21: 17:00:00 via INTRAVENOUS

## 2016-10-21 MED ORDER — ALUM & MAG HYDROXIDE-SIMETH 200-200-20 MG/5ML PO SUSP
30.0000 mL | Freq: Four times a day (QID) | ORAL | Status: DC | PRN
  Administered 2016-10-21: 30 mL via ORAL
  Filled 2016-10-21: qty 30

## 2016-10-21 MED ORDER — CLONAZEPAM 0.5 MG PO TBDP: 0.5000 mg | ORAL_TABLET | Freq: Two times a day (BID) | ORAL | Status: DC | PRN

## 2016-10-21 MED ORDER — AMIODARONE HCL IN DEXTROSE 360-4.14 MG/200ML-% IV SOLN
INTRAVENOUS | Status: AC
  Filled 2016-10-21: qty 200

## 2016-10-21 MED ORDER — BUTALBITAL-APAP-CAFFEINE 50-325-40 MG PO TABS: 1.0000 | ORAL_TABLET | Freq: Four times a day (QID) | ORAL | Status: DC | PRN

## 2016-10-21 MED ORDER — AMIODARONE LOAD VIA INFUSION
150.0000 mg | Freq: Once | INTRAVENOUS | Status: AC
  Administered 2016-10-21: 150 mg via INTRAVENOUS
  Filled 2016-10-21: qty 83.34

## 2016-10-21 MED ORDER — METOPROLOL TARTRATE 25 MG PO TABS
25.0000 mg | ORAL_TABLET | Freq: Four times a day (QID) | ORAL | Status: DC
  Administered 2016-10-21 – 2016-10-22 (×3): 25 mg via ORAL
  Filled 2016-10-21 (×3): qty 1

## 2016-10-21 MED ORDER — AMIODARONE HCL IN DEXTROSE 360-4.14 MG/200ML-% IV SOLN
30.0000 mg/h | INTRAVENOUS | Status: DC
  Administered 2016-10-21 – 2016-10-22 (×2): 30 mg/h via INTRAVENOUS
  Filled 2016-10-21: qty 200

## 2016-10-21 MED ORDER — OXYCODONE HCL 5 MG PO TABS
5.0000 mg | ORAL_TABLET | Freq: Once | ORAL | Status: AC
  Administered 2016-10-21: 5 mg via ORAL
  Filled 2016-10-21: qty 1

## 2016-10-21 MED ORDER — AMIODARONE HCL IN DEXTROSE 360-4.14 MG/200ML-% IV SOLN
60.0000 mg/h | INTRAVENOUS | Status: AC
  Administered 2016-10-21 (×2): 60 mg/h via INTRAVENOUS
  Filled 2016-10-21: qty 200

## 2016-10-21 MED ORDER — AMIODARONE LOAD VIA INFUSION: 150.0000 mg | Freq: Once | INTRAVENOUS | Status: DC

## 2016-10-21 MED ORDER — CLONAZEPAM 0.5 MG PO TBDP
0.5000 mg | ORAL_TABLET | Freq: Every evening | ORAL | Status: DC | PRN
  Administered 2016-10-21 – 2016-10-23 (×3): 0.5 mg via ORAL
  Filled 2016-10-21 (×3): qty 1

## 2016-10-21 MED ORDER — APIXABAN 5 MG PO TABS
5.0000 mg | ORAL_TABLET | Freq: Two times a day (BID) | ORAL | Status: DC
  Administered 2016-10-21 – 2016-10-25 (×9): 5 mg via ORAL
  Filled 2016-10-21 (×9): qty 1

## 2016-10-21 NOTE — Progress Notes (Addendum)
EKG performed, lab in room at this time pt. Continues to c/o 10/10 mid sternal chest pain, sharp burning, nitro given x3, xanax given, tylenol also given, VSS. Pt. Denies nausea and vomiting.

## 2016-10-21 NOTE — Progress Notes (Signed)
md fath was notified that pt is having nonsustained v tach and pt is reporting chest pain. Metoprolol 25 mg po q6h. Pt has no further concerns at this time.

## 2016-10-21 NOTE — Progress Notes (Signed)
RN notified by Misty Stanley, RN that patient was experiencing chest pain. Upon assessment of patient, I noticed that patient was pointing to chest stating " the pain hurts so bad". Nitro given without any relief. Patient kept requesting to get up out of bed and " I feel like I am going to die". This RN and Misty Stanley, RN educated the patient on the importance of staying in the bed while she was experiencing chest pain, also provided emotional support. This RN noticed that the patient was repeatedly burping, and passing flatus. When asked if patient was experiencing indigestion, patient stated that she did not think it was but that she was having an heart attack. MD notified. Orders placed. EKG performed, showed A. Fib with heart rate 100-115. VSS. MD notified. Orders placed. Will continue to monitor.  Update: Patient now resting comfortably after receiving medications. No complaints of chest pain at this time. VSS. Will continue to monitor.   Mayra Neer M

## 2016-10-21 NOTE — Progress Notes (Signed)
       [facility]  SUBJECTIVE: somnolent but arousable.   Vitals:   10/21/16 0026 10/21/16 0039 10/21/16 0409 10/21/16 0743  BP: (!) 163/82 135/78 (!) 142/70 (!) 140/56  Pulse: (!) 118 75 97 (!) 102  Resp: 20  16   Temp:   98.3 F (36.8 C)   TempSrc:      SpO2: 92% 96% 93% 96%  Weight:      Height:        Intake/Output Summary (Last 24 hours) at 10/21/16 1402 Last data filed at 10/21/16 1248  Gross per 24 hour  Intake              240 ml  Output              300 ml  Net              -60 ml    LABS: Basic Metabolic Panel:  Recent Labs  14/48/18 0541 10/21/16 0003  NA 145 143  K 4.2 4.3  CL 115* 113*  CO2 22 22  GLUCOSE 85 136*  BUN 29* 29*  CREATININE 1.56* 1.79*  CALCIUM 8.5* 8.6*  MG 1.8  --   PHOS 4.0  --    Liver Function Tests:  Recent Labs  10/19/16 0958  AST 21  ALT 17  ALKPHOS 147*  BILITOT 1.0  PROT 6.2*  ALBUMIN 3.3*   No results for input(s): LIPASE, AMYLASE in the last 72 hours. CBC:  Recent Labs  10/19/16 0958 10/20/16 0541 10/21/16 0003  WBC 10.8 10.0  --   NEUTROABS 8.6*  --   --   HGB 8.8* 7.7* 8.0*  HCT 29.0* 25.8*  --   MCV 82.0 80.5  --   PLT 339 329  --    Cardiac Enzymes:  Recent Labs  10/21/16 0003 10/21/16 0516 10/21/16 1130  TROPONINI 0.03* <0.03 <0.03   BNP: Invalid input(s): POCBNP D-Dimer: No results for input(s): DDIMER in the last 72 hours. Hemoglobin A1C:  Recent Labs  10/19/16 1347  HGBA1C 6.4*   Fasting Lipid Panel: No results for input(s): CHOL, HDL, LDLCALC, TRIG, CHOLHDL, LDLDIRECT in the last 72 hours. Thyroid Function Tests: No results for input(s): TSH, T4TOTAL, T3FREE, THYROIDAB in the last 72 hours.  Invalid input(s): FREET3 Anemia Panel: No results for input(s): VITAMINB12, FOLATE, FERRITIN, TIBC, IRON, RETICCTPCT in the last 72 hours.   Physical Exam: Blood pressure (!) 140/56, pulse (!) 102, temperature 98.3 F (36.8 C), resp.  rate 16, height 5\' 4"  (1.626 m), weight 68.9 kg (151 lb 14.4 oz), SpO2 96 %.   Wt Readings from Last 1 Encounters:  10/20/16 68.9 kg (151 lb 14.4 oz)     General appearance: slowed mentation Resp: clear to auscultation bilaterally Chest wall: no tenderness Cardio: regular rate and rhythm GI: soft, non-tender; bowel sounds normal; no masses,  no organomegaly Extremities: extremities normal, atraumatic, no cyanosis or edema Neurologic: Grossly normal  TELEMETRY: Reviewed telemetry pt in nsr with episodic wide complex tachycardia. afib with abarency vs nsvt:  ASSESSMENT AND PLAN:  Active Problems:   Bradycardia-Bradycardia has improved off of amiodaorne, beta blockers and calcium channel blockers. Will add amiodarone back at 200 mg daily and follow. May beed to add Will add eliquis back at 5 mg bid as well.     Dalia Heading, MD, [facility] 10/21/2016 2:02 PM

## 2016-10-21 NOTE — Progress Notes (Signed)
md chen was notified of 6 beat run of vtach. No further orders at this time.

## 2016-10-21 NOTE — Progress Notes (Signed)
md fath was notified of 14 beat run of vtach. Pt stable

## 2016-10-21 NOTE — Progress Notes (Signed)
Pt transferred to a low sizewise bed

## 2016-10-21 NOTE — Progress Notes (Signed)
Patient admitted for fall due to syncopal episode. Patient having frequent requests to get out of bed and periods of impulsiveness. [facility]wise contacted for low bed. Patient educated about the low bed, and the reasons why we are switching out her bed. All questions answered, Awaiting low bed from size wise. Will continue to monitor.   Mayra Neer M

## 2016-10-21 NOTE — Evaluation (Signed)
Physical Therapy Evaluation Patient Details Name: Robin [name] MRN: [id number] DOB: 1940/07/30 Today's Date: 10/21/2016   History of Present Illness  76 yo Female came to ED with history of A-fib and had syncope episode from bradycardia. Concerned that patient is not taking meds correctly; She has been living with her children intermittently and has a friend who also helps her. Her daughters are POA. One daughter from [place] will be coming on 8/18 and will be taking her mom back to [place] to stay permanently.   Clinical Impression  76 yo Female admitted with recent syncope episode/fall from bradycardia. Patient is oriented to person, but not to date/situation. She has a home in [place] but does not stay in this home. She has been staying with her kids going to/from different homes. Patient also has some help from her friend Robin [name]. Patient was using a rollator for ambulation. It is unclear how much assistance she needed with ADLs prior to admittance. Currently she is min-mod A for bed mobility. She requires min A for sit<>Stand transfers. Patient ambulated 15 feet in room with min A for safety and requiring assistance for walker placement and positioning. Patient is impulsive and had difficulty following commands with gait tasks often running into objects in her room. She will require supervision/assistance 24/7 for safety. Recommend SNF/STR upon discharge to continue working on strength, balance and gait safety; Patient's daughter from [place] plans to come on Saturday 10/27/16 and pick up patient and take her back to [place] permanently to better address her needs.     Follow Up Recommendations Supervision/Assistance - 24 hour;SNF (if family not agreeable to SNF rehab, patient will need 24 hour supervision/assistance )    Equipment Recommendations  None recommended by PT    Recommendations for Other Services       Precautions / Restrictions Precautions Precautions: Fall Restrictions Weight Bearing  Restrictions: No      Mobility  Bed Mobility Overal bed mobility: Needs Assistance Bed Mobility: Supine to Sit;Sit to Supine     Supine to sit: Mod assist;HOB elevated Sit to supine: Min assist;HOB elevated   General bed mobility comments: needs assistance for upper trunk positioning; also requires cues for hand placement to help pull self up/reposition;   Transfers Overall transfer level: Needs assistance Equipment used: Rolling walker (2 wheeled) Transfers: Sit to/from Stand Sit to Stand: Min assist         General transfer comment: required cues for forward weight shift and to increase push through LE for transfer;   Ambulation/Gait Ambulation/Gait assistance: Min assist Ambulation Distance (Feet): 15 Feet Assistive device: Rolling walker (2 wheeled) Gait Pattern/deviations: Step-to pattern;Decreased step length - right;Decreased step length - left;Decreased dorsiflexion - left;Decreased dorsiflexion - right;Trunk flexed;Shuffle;Staggering right;Staggering left Gait velocity: decreased   General Gait Details: unsteady during ambulation requiring cues to stay close to walker; requires min A to direct walker placement for safety; Had increased unsteadiness during turns; Patient impulsive and had difficulty following commands for direction of gait;   Stairs            Wheelchair Mobility    Modified Rankin (Stroke Patients Only)       Balance Overall balance assessment: Needs assistance Sitting-balance support: Bilateral upper extremity supported;Feet supported Sitting balance-Leahy Scale: Poor Sitting balance - Comments: static balance is fair, dynamic balance is poor with heavy posterior lean and weight shift with movement;  Postural control: Posterior lean Standing balance support: Bilateral upper extremity supported Standing balance-Leahy Scale: Fair Standing balance comment:  pt unsteady during ambulation requiring min A to right self to midline;                               Pertinent Vitals/Pain Pain Assessment: No/denies pain    Home Living Family/patient expects to be discharged to:: Unsure                 Additional Comments: Patient has a home in [place], [place] but she can't stay there by herself. Her family has been taking turns having her stay with them. She stays with her son on M/T and then with her daughter some during the week and then with her friend Robin [name] when he is off work. Patient's Daughter who lives in [place] will be coming and taking her [place] to live permanently to help care for her mom so that she doesn't have to go to so many different places.     Prior Function Level of Independence: Needs assistance   Gait / Transfers Assistance Needed: uses rollator for ambulation;   ADL's / Homemaking Assistance Needed: needs some help with ADLs, unsure how much as patient is poor historian and family not around        Hand Dominance   Dominant Hand: Right    Extremity/Trunk Assessment   Upper Extremity Assessment Upper Extremity Assessment: Generalized weakness    Lower Extremity Assessment Lower Extremity Assessment: RLE deficits/detail;LLE deficits/detail RLE Deficits / Details: ROM is WFL; strength grossly 3+/5, intact light touch sensation;  LLE Deficits / Details: ROM is WFL; strength grossly 3+/5, intact light touch sensation;     Cervical / Trunk Assessment Cervical / Trunk Assessment: Kyphotic  Communication   Communication: No difficulties  Cognition Arousal/Alertness: Lethargic Behavior During Therapy: Flat affect Overall Cognitive Status: Difficult to assess                                 General Comments: does have history of cognitive impairment; unsure if this is baseline; not oriented to date; oriented to person and place;       General Comments General comments (skin integrity, edema, etc.): has bruising on face from recent fall; no swelling in LE noted;      Exercises     Assessment/Plan    PT Assessment Patient needs continued PT services  PT Problem List Decreased strength;Decreased activity tolerance;Decreased balance;Decreased mobility;Decreased safety awareness       PT Treatment Interventions Gait training;Functional mobility training;Balance training;Therapeutic exercise;Therapeutic activities;DME instruction;Patient/family education    PT Goals (Current goals can be found in the Care Plan section)  Acute Rehab PT Goals Patient Stated Goal: "I just want to rest."  PT Goal Formulation: With patient Time For Goal Achievement: 11/04/16 Potential to Achieve Goals: Good    Frequency Min 2X/week   Barriers to discharge Decreased caregiver support;Inaccessible home environment unsure about home environment as patient has been staying in different homes with her kids; some have stairs, some don't; unsure about caregiver support;     Co-evaluation               AM-PAC PT "6 Clicks" Daily Activity  Outcome Measure Difficulty turning over in bed (including adjusting bedclothes, sheets and blankets)?: A Lot Difficulty moving from lying on back to sitting on the side of the bed? : Total Difficulty sitting down on and standing up from a chair with arms (e.g.,  wheelchair, bedside commode, etc,.)?: Total Help needed moving to and from a bed to chair (including a wheelchair)?: A Little Help needed walking in hospital room?: A Little Help needed climbing 3-5 steps with a railing? : A Lot 6 Click Score: 12    End of Session Equipment Utilized During Treatment: Gait belt Activity Tolerance: Patient limited by fatigue;Patient limited by lethargy Patient left: in bed;with call bell/phone within reach;with family/visitor present;with bed alarm set Nurse Communication: Mobility status;Other (comment) (PT recommendations; ) PT Visit Diagnosis: Unsteadiness on feet (R26.81);Muscle weakness (generalized) (M62.81)    Time: 1610-9604 PT  Time Calculation (min) (ACUTE ONLY): 30 min   Charges:   PT Evaluation $PT Eval Low Complexity: 1 Low     PT G Codes:   PT G-Codes **NOT FOR INPATIENT CLASS** Functional Assessment Tool Used: AM-PAC 6 Clicks Basic Mobility;Clinical judgement Functional Limitation: Mobility: Walking and moving around Mobility: Walking and Moving Around Current Status (V4098): At least 60 percent but less than 80 percent impaired, limited or restricted Mobility: Walking and Moving Around Goal Status [phone]): At least 60 percent but less than 80 percent impaired, limited or restricted     Urie Loughner PT, DPT 10/21/2016, 11:05 AM

## 2016-10-21 NOTE — Progress Notes (Signed)
[employer] -  at [facility]   PATIENT NAME: Robin [name]    MR#:  [id number]  DATE OF BIRTH:  [birth year]  SUBJECTIVE:  CHIEF COMPLAINT:   Chief Complaint  Patient presents with  . Fall  . Bradycardia   Chest pain last night. Heart rate was up to 110. REVIEW OF SYSTEMS:  Review of Systems  Constitutional: Positive for malaise/fatigue. Negative for chills and fever.  HENT: Negative for sore throat.   Eyes: Negative for blurred vision and double vision.  Respiratory: Negative for cough, hemoptysis, shortness of breath, wheezing and stridor.   Cardiovascular: Positive for chest pain. Negative for palpitations, orthopnea and leg swelling.  Gastrointestinal: Negative for abdominal pain, blood in stool, diarrhea, melena, nausea and vomiting.  Genitourinary: Negative for dysuria, flank pain and hematuria.  Musculoskeletal: Negative for back pain and joint pain.  Neurological: Positive for weakness. Negative for dizziness, sensory change, focal weakness, seizures, loss of consciousness and headaches.  Endo/Heme/Allergies: Negative for polydipsia.  Psychiatric/Behavioral: Negative for depression. The patient is not nervous/anxious.     DRUG ALLERGIES:   Allergies  Allergen Reactions  . Morphine And Related Other (See Comments)    Patient states it makes her "space out"   VITALS:  Blood pressure (!) 140/56, pulse (!) 102, temperature 98.3 F (36.8 C), resp. rate 16, height 5\' 4"  (1.626 m), weight 151 lb 14.4 oz (68.9 kg), SpO2 96 %. PHYSICAL EXAMINATION:  Physical Exam  Constitutional: She is oriented to person, place, and time and well-developed, well-nourished, and in no distress.  HENT:  Head: Normocephalic.  Mouth/Throat: Oropharynx is clear and moist.  Eyes: Pupils are equal, round, and reactive to light. Conjunctivae and EOM are normal. No scleral icterus.  Neck: Normal range of motion. Neck supple. No JVD present. No tracheal deviation present.   Cardiovascular: Normal heart sounds.  Exam reveals no gallop.   No murmur heard. Irregular rate and rhythm  Pulmonary/Chest: Effort normal and breath sounds normal. No respiratory distress. She has no wheezes. She has no rales.  Abdominal: Soft. Bowel sounds are normal. She exhibits no distension. There is no tenderness. There is no rebound.  Musculoskeletal: Normal range of motion. She exhibits no edema or tenderness.  Neurological: She is alert and oriented to person, place, and time. No cranial nerve deficit.  Skin: No rash noted. No erythema.  Psychiatric: Affect normal.   LABORATORY PANEL:  Female CBC  Recent Labs Lab 10/20/16 0541 10/21/16 0003  WBC 10.0  --   HGB 7.7* 8.0*  HCT 25.8*  --   PLT 329  --    ------------------------------------------------------------------------------------------------------------------ Chemistries   Recent Labs Lab 10/19/16 0958 10/20/16 0541 10/21/16 0003  NA 143 145 143  K 4.3 4.2 4.3  CL 114* 115* 113*  CO2 20* 22 22  GLUCOSE 218* 85 136*  BUN 26* 29* 29*  CREATININE 1.58* 1.56* 1.79*  CALCIUM 8.3* 8.5* 8.6*  MG  --  1.8  --   AST 21  --   --   ALT 17  --   --   ALKPHOS 147*  --   --   BILITOT 1.0  --   --    RADIOLOGY:  No results found. ASSESSMENT AND PLAN:   1. Severe bradycardia with junctional rhythm with syncopal episode.  Improved after discontinuing amiodarone, Lopressor and Cardizem. But has had tachycardia since last night. Per Dr. Lady Gary, resume Eliquis and amiodarone 200 mg daily po.   2. Acute kidney injury  on chronic kidney disease stage III. Worsening. Continue Gentle IV fluid hydration, follow-up BMP. Hold Lasix.  3. Essential hypertension. With the patient's bradycardia, hold all home antihypertensive medications at this point. IV hydralazine when necessary.  4. Head trauma on Eliquis. Stable. 5. Chronic Atrial fibrillation history.  Per Dr. Lady Gary, resume Eliquis and amiodarone 200 mg daily po.   6.  Lactic acidosis.  Improved. 7. Anemia of chronic disease. Stable. 8. Type 2 diabetes mellitus put on sliding scale and hold oral diabetic medications 9. Hyperlipidemia unspecified on atorvastatin 10. Chronic systolic CHF with ejection fraction 15%.  No signs of congestive heart failure at this point. Lasix, Lopressor and lisinopril are on hold due to ARF on CKD and bradycardia.  Generalized weakness. PT evaluation: SNF. I discussed with Dr. Lady Gary. All the records are reviewed and case discussed with Care Management/Social Worker. Management plans discussed with the patient, family and they are in agreement.  CODE STATUS: Full Code  TOTAL TIME TAKING CARE OF THIS PATIENT: 35 minutes.   More than 50% of the time was spent in counseling/coordination of care: YES  POSSIBLE D/C IN 1-2 DAYS, DEPENDING ON CLINICAL CONDITION.   Shaune Pollack M.D on 10/21/2016 at 1:16 PM  Between 7am to 6pm - Pager - [phone]  After 6pm go to [email or web address] - Social research officer, government  [facility]  Office  [phone]  CC: Primary care physician; Leanna Sato, MD  Note: This dictation was prepared with Dragon dictation along with smaller phrase technology. Any transcriptional errors that result from this process are unintentional.

## 2016-10-21 NOTE — Progress Notes (Signed)
md fath was notified that ecg showed a fib rvr 156 but looks like non sustained vtach. md will place orders for amio gtt. Daughter was notified of change.

## 2016-10-21 NOTE — Progress Notes (Signed)
Amio bolus was started. Tammy T RN vertified. Pt reports chest pain getting better.

## 2016-10-21 NOTE — Progress Notes (Signed)
md chen was notified of 14 beat run of vtach.

## 2016-10-22 DIAGNOSIS — J45909 Unspecified asthma, uncomplicated: Secondary | ICD-10-CM | POA: Diagnosis present

## 2016-10-22 DIAGNOSIS — I2510 Atherosclerotic heart disease of native coronary artery without angina pectoris: Secondary | ICD-10-CM | POA: Diagnosis present

## 2016-10-22 DIAGNOSIS — N179 Acute kidney failure, unspecified: Secondary | ICD-10-CM | POA: Diagnosis present

## 2016-10-22 DIAGNOSIS — I4891 Unspecified atrial fibrillation: Secondary | ICD-10-CM | POA: Diagnosis present

## 2016-10-22 DIAGNOSIS — E785 Hyperlipidemia, unspecified: Secondary | ICD-10-CM | POA: Diagnosis present

## 2016-10-22 DIAGNOSIS — Z885 Allergy status to narcotic agent status: Secondary | ICD-10-CM | POA: Diagnosis not present

## 2016-10-22 DIAGNOSIS — F0390 Unspecified dementia without behavioral disturbance: Secondary | ICD-10-CM | POA: Diagnosis present

## 2016-10-22 DIAGNOSIS — R001 Bradycardia, unspecified: Secondary | ICD-10-CM | POA: Diagnosis present

## 2016-10-22 DIAGNOSIS — E872 Acidosis: Secondary | ICD-10-CM | POA: Diagnosis present

## 2016-10-22 DIAGNOSIS — Z951 Presence of aortocoronary bypass graft: Secondary | ICD-10-CM | POA: Diagnosis not present

## 2016-10-22 DIAGNOSIS — E1122 Type 2 diabetes mellitus with diabetic chronic kidney disease: Secondary | ICD-10-CM | POA: Diagnosis present

## 2016-10-22 DIAGNOSIS — R55 Syncope and collapse: Secondary | ICD-10-CM | POA: Diagnosis present

## 2016-10-22 DIAGNOSIS — F419 Anxiety disorder, unspecified: Secondary | ICD-10-CM | POA: Diagnosis present

## 2016-10-22 DIAGNOSIS — Z9119 Patient's noncompliance with other medical treatment and regimen: Secondary | ICD-10-CM | POA: Diagnosis not present

## 2016-10-22 DIAGNOSIS — D638 Anemia in other chronic diseases classified elsewhere: Secondary | ICD-10-CM | POA: Diagnosis present

## 2016-10-22 DIAGNOSIS — N183 Chronic kidney disease, stage 3 (moderate): Secondary | ICD-10-CM | POA: Diagnosis present

## 2016-10-22 DIAGNOSIS — Z79899 Other long term (current) drug therapy: Secondary | ICD-10-CM | POA: Diagnosis not present

## 2016-10-22 DIAGNOSIS — W19XXXA Unspecified fall, initial encounter: Secondary | ICD-10-CM | POA: Diagnosis present

## 2016-10-22 DIAGNOSIS — I130 Hypertensive heart and chronic kidney disease with heart failure and stage 1 through stage 4 chronic kidney disease, or unspecified chronic kidney disease: Secondary | ICD-10-CM | POA: Diagnosis present

## 2016-10-22 DIAGNOSIS — I482 Chronic atrial fibrillation: Secondary | ICD-10-CM | POA: Diagnosis present

## 2016-10-22 DIAGNOSIS — J9690 Respiratory failure, unspecified, unspecified whether with hypoxia or hypercapnia: Secondary | ICD-10-CM | POA: Diagnosis present

## 2016-10-22 DIAGNOSIS — S0003XA Contusion of scalp, initial encounter: Secondary | ICD-10-CM | POA: Diagnosis present

## 2016-10-22 DIAGNOSIS — I5022 Chronic systolic (congestive) heart failure: Secondary | ICD-10-CM | POA: Diagnosis present

## 2016-10-22 DIAGNOSIS — Z7901 Long term (current) use of anticoagulants: Secondary | ICD-10-CM | POA: Diagnosis not present

## 2016-10-22 LAB — BASIC METABOLIC PANEL
Anion gap: 8 (ref 5–15)
BUN: 30 mg/dL — AB (ref 6–20)
CO2: 21 mmol/L — AB (ref 22–32)
CREATININE: 1.76 mg/dL — AB (ref 0.44–1.00)
Calcium: 8.4 mg/dL — ABNORMAL LOW (ref 8.9–10.3)
Chloride: 112 mmol/L — ABNORMAL HIGH (ref 101–111)
GFR calc Af Amer: 31 mL/min — ABNORMAL LOW (ref >60)
GFR calc non Af Amer: 27 mL/min — ABNORMAL LOW (ref >60)
Glucose, Bld: 127 mg/dL — ABNORMAL HIGH (ref 65–99)
Potassium: 5 mmol/L (ref 3.5–5.1)
Sodium: 141 mmol/L (ref 135–145)

## 2016-10-22 LAB — GLUCOSE, CAPILLARY
GLUCOSE-CAPILLARY: 129 mg/dL — AB (ref 65–99)
Glucose-Capillary: 122 mg/dL — ABNORMAL HIGH (ref 65–99)
Glucose-Capillary: 127 mg/dL — ABNORMAL HIGH (ref 65–99)
Glucose-Capillary: 125 mg/dL — ABNORMAL HIGH (ref 65–99)
Glucose-Capillary: 127 mg/dL — ABNORMAL HIGH (ref 65–99)

## 2016-10-22 MED ORDER — BISACODYL 5 MG PO TBEC
5.0000 mg | DELAYED_RELEASE_TABLET | Freq: Every day | ORAL | Status: DC | PRN
Start: 2016-10-22 — End: 2016-10-25

## 2016-10-22 MED ORDER — DOCUSATE SODIUM 100 MG PO CAPS
100.0000 mg | ORAL_CAPSULE | Freq: Two times a day (BID) | ORAL | Status: DC
  Administered 2016-10-22 – 2016-10-25 (×6): 100 mg via ORAL
  Filled 2016-10-22 (×6): qty 1

## 2016-10-22 MED ORDER — KETOROLAC TROMETHAMINE 0.5 % OP SOLN
1.0000 [drp] | Freq: Four times a day (QID) | OPHTHALMIC | Status: DC
  Administered 2016-10-22 – 2016-10-25 (×11): 1 [drp] via OPHTHALMIC
  Filled 2016-10-22: qty 3

## 2016-10-22 NOTE — Progress Notes (Signed)
md fath was notified of hr 49 and bp stable. RN stopped amio gtt. MD chen was nofitied of stop. No further orders at this time.

## 2016-10-22 NOTE — Progress Notes (Signed)
       [facility]  SUBJECTIVE: multiple complaints   Vitals:   10/21/16 2309 10/22/16 0421 10/22/16 0438 10/22/16 0733  BP: (!) 106/59 (!) 130/44  140/63  Pulse: 86 78  79  Resp:  17    Temp:   (!) 97.4 F (36.3 C)   TempSrc:   Axillary   SpO2: 100% 100%  100%  Weight:      Height:        Intake/Output Summary (Last 24 hours) at 10/22/16 0756 Last data filed at 10/22/16 0000  Gross per 24 hour  Intake            416.3 ml  Output              200 ml  Net            216.3 ml    LABS: Basic Metabolic Panel:  Recent Labs  01/11/10 0541 10/21/16 0003 10/22/16 0648  NA 145 143 141  K 4.2 4.3 5.0  CL 115* 113* 112*  CO2 22 22 21*  GLUCOSE 85 136* 127*  BUN 29* 29* 30*  CREATININE 1.56* 1.79* 1.76*  CALCIUM 8.5* 8.6* 8.4*  MG 1.8  --   --   PHOS 4.0  --   --    Liver Function Tests:  Recent Labs  10/19/16 0958  AST 21  ALT 17  ALKPHOS 147*  BILITOT 1.0  PROT 6.2*  ALBUMIN 3.3*   No results for input(s): LIPASE, AMYLASE in the last 72 hours. CBC:  Recent Labs  10/19/16 0958 10/20/16 0541 10/21/16 0003  WBC 10.8 10.0  --   NEUTROABS 8.6*  --   --   HGB 8.8* 7.7* 8.0*  HCT 29.0* 25.8*  --   MCV 82.0 80.5  --   PLT 339 329  --    Cardiac Enzymes:  Recent Labs  10/21/16 0003 10/21/16 0516 10/21/16 1130  TROPONINI 0.03* <0.03 <0.03   BNP: Invalid input(s): POCBNP D-Dimer: No results for input(s): DDIMER in the last 72 hours. Hemoglobin A1C:  Recent Labs  10/19/16 1347  HGBA1C 6.4*   Fasting Lipid Panel: No results for input(s): CHOL, HDL, LDLCALC, TRIG, CHOLHDL, LDLDIRECT in the last 72 hours. Thyroid Function Tests: No results for input(s): TSH, T4TOTAL, T3FREE, THYROIDAB in the last 72 hours.  Invalid input(s): FREET3 Anemia Panel: No results for input(s): VITAMINB12, FOLATE, FERRITIN, TIBC, IRON, RETICCTPCT in the last 72 hours.   Physical Exam: Blood pressure 140/63, pulse 79,  temperature (!) 97.4 F (36.3 C), temperature source Axillary, resp. rate 17, height 5\' 4"  (1.626 m), weight 68.9 kg (151 lb 14.4 oz), SpO2 100 %.   Wt Readings from Last 1 Encounters:  10/20/16 68.9 kg (151 lb 14.4 oz)     General appearance: cooperative and slowed mentation Resp: clear to auscultation bilaterally Cardio: irregularly irregular rhythm GI: soft, non-tender; bowel sounds normal; no masses,  no organomegaly Extremities: extremities normal, atraumatic, no cyanosis or edema Neurologic: Grossly normal  TELEMETRY: Reviewed telemetry pt in afib with intermitent wide complex tachycardia. Appears to be aberrant conducted afib vs ventricular tach.   ASSESSMENT AND PLAN:  Active Problems:   Bradycardia-resolved after holding cardizem, metoprolol and amiodarone. Now has recurrent afib with rvr. Have added iv amiodarone back with some improvement. Will follow rhythm    Dalia Heading, MD, [facility] 10/22/2016 7:56 AM

## 2016-10-22 NOTE — Care Management (Signed)
IV Amiodarone for atrial fib control.  Physical therapy has recommended skilled nursing facility. Patient's companion that is at bedside said patient's daughter Fulton Mole who is from [place] is planning to take patient to [place].   It is documented that there is an active Adult Protective Service investigation in progress and the agency is recommending placement. Reaching to to [facility] to discuss whether she plans to take patient to [place].  Patient currently requiring oxygen and does not participate in discussion.

## 2016-10-22 NOTE — Progress Notes (Signed)
Patient temperature 97.4 axillary. Room temperature increased, and warm blanket provided to patient. Will continue to monitor.   Robin [name]

## 2016-10-22 NOTE — Progress Notes (Signed)
Patient making frequent request to get out of bed during shift. Patient reports feelings of weakness. RN educated patient on the importance of safety and mobility. Patient having a time understanding that she is sick and unable to do the things that she use to do. Educated reiterated. Will continue to monitor.  Mayra Neer M

## 2016-10-22 NOTE — [place] FL2 (Signed)
[place] MEDICAID FL2 LEVEL OF CARE SCREENING TOOL     IDENTIFICATION  Patient Name: Robin [name] Birthdate: January 08, 1941 Sex: female Admission Date (Current Location): 10/19/2016  [place] and [place] Number:  [address] [id number] Facility and Address:  [facility], [address] [place], [place] [address]      Provider Number: [id number]  Attending Physician Name and Address:  Shaune Pollack, MD  Relative Name and Phone Number:  [name],[name] Daughter [phone], or [facility] Daughter [phone] or [place], Adan [phone] or Denna Haggard   [phone]     Current Level of Care: Hospital Recommended Level of Care: Skilled Nursing Facility Prior Approval Number:    Date Approved/Denied:   PASRR Number: [id number]  Discharge Plan: SNF    Current Diagnoses: Patient Active Problem List   Diagnosis Date Noted  . Atrial fibrillation, rapid (HCC) 10/22/2016  . Bradycardia 10/19/2016  . Palliative care by specialist   . Goals of care, counseling/discussion   . Abdominal pain 06/07/2016  . Transaminitis   . Atrial fibrillation with RVR (HCC) 05/03/2016  . Demand ischemia (HCC) 05/03/2016  . Moderate aortic stenosis 05/03/2016  . Moderate mitral regurgitation 05/03/2016  . History of noncompliance with medical treatment 05/03/2016  . Congestive dilated cardiomyopathy (HCC) 05/03/2016  . Chest pain 05/02/2016  . 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 Renal artery stenosis (HCC) 02/28/2016  . Accelerated hypertension 02/28/2016  . Mild dementia 02/28/2016  . Noncompliance 02/07/2016  . Renal infarct (HCC) 02/06/2016  . Atrial fibrillation with rapid ventricular response (HCC) 02/06/2016  . A-fib (HCC) 01/24/2016  . HTN (hypertension) 11/04/2015  . HLD (hyperlipidemia) 11/04/2015  . CAD (coronary artery disease) 11/04/2015  . Acute on chronic systolic CHF (congestive heart failure) (HCC) 11/04/2015  . Diabetes (HCC) 11/04/2015     Orientation RESPIRATION BLADDER Height & Weight     Self, Situation  Normal Continent Weight: 151 lb 14.4 oz (68.9 kg) Height:  5\' 4"  (162.6 cm)  BEHAVIORAL SYMPTOMS/MOOD NEUROLOGICAL BOWEL NUTRITION STATUS      Continent Diet (Mechanical soft diet)  AMBULATORY STATUS COMMUNICATION OF NEEDS Skin   Limited Assist Verbally Normal                       Personal Care Assistance Level of Assistance  Bathing, Feeding, Dressing Bathing Assistance: Limited assistance Feeding assistance: Limited assistance Dressing Assistance: Limited assistance     Functional Limitations Info             SPECIAL CARE FACTORS FREQUENCY  PT (By licensed PT)     PT Frequency: minimum 5x a week              Contractures      Additional Factors Info  Code Status, Allergies, Insulin Sliding Scale Code Status Info: Full Code Allergies Info: MORPHINE AND RELATED    Insulin Sliding Scale Info: insulin aspart (novoLOG) injection 0-5 Units at bedtime and insulin aspart (novoLOG) injection 0-9 Units 3x a day with meals       Current Medications (10/22/2016):  This is the current hospital active medication list Current Facility-Administered Medications  Medication Dose Route Frequency Provider Last Rate Last Dose  . acetaminophen (TYLENOL) tablet 650 mg  650 mg Oral Q6H PRN Alford Highland, MD   650 mg at 10/22/16 0255   Or  . acetaminophen (TYLENOL) suppository 650 mg  650 mg Rectal Q6H PRN Wieting, Richard, MD      . albuterol (PROVENTIL) (2.5 MG/3ML) 0.083% nebulizer solution  3 mL  3 mL Inhalation Q6H PRN Alford Highland, MD   3 mL at 10/20/16 2126  . alum & mag hydroxide-simeth (MAALOX/MYLANTA) 200-200-20 MG/5ML suspension 30 mL  30 mL Oral Q6H PRN Oralia Manis, MD   30 mL at 10/21/16 0020  . amiodarone (NEXTERONE PREMIX) 360-4.14 MG/200ML-% (1.8 mg/mL) IV infusion  30 mg/hr Intravenous Continuous Dalia Heading, MD   Stopped at 10/22/16 1118  . apixaban (ELIQUIS) tablet 5 mg  5 mg  Oral BID Shaune Pollack, MD   5 mg at 10/22/16 0908  . atorvastatin (LIPITOR) tablet 40 mg  40 mg Oral q1800 Alford Highland, MD   40 mg at 10/22/16 1742  . bisacodyl (DULCOLAX) EC tablet 5 mg  5 mg Oral Daily PRN Shaune Pollack, MD      . clonazePAM Scarlette Calico) disintegrating tablet 0.5 mg  0.5 mg Oral QHS PRN Arnaldo Natal, MD   0.5 mg at 10/21/16 2305  . docusate sodium (COLACE) capsule 100 mg  100 mg Oral BID Shaune Pollack, MD   100 mg at 10/22/16 1742  . feeding supplement (GLUCERNA SHAKE) (GLUCERNA SHAKE) liquid 237 mL  237 mL Oral TID BM Alford Highland, MD   237 mL at 10/22/16 1400  . glucagon (human recombinant) (GLUCAGEN) injection 1 mg  1 mg Intravenous Once PRN Alford Highland, MD      . hydrALAZINE (APRESOLINE) injection 10 mg  10 mg Intravenous Q6H PRN Shaune Pollack, MD      . insulin aspart (novoLOG) injection 0-5 Units  0-5 Units Subcutaneous QHS Wieting, Richard, MD      . insulin aspart (novoLOG) injection 0-9 Units  0-9 Units Subcutaneous TID WC Alford Highland, MD   2 Units at 10/20/16 1754  . ketorolac (ACULAR) 0.5 % ophthalmic solution 1 drop  1 drop Left Eye QID Shaune Pollack, MD      . metoprolol tartrate (LOPRESSOR) tablet 25 mg  25 mg Oral Q6H Dalia Heading, MD   Stopped at 10/22/16 1115  . nitroGLYCERIN (NITROSTAT) SL tablet 0.4 mg  0.4 mg Sublingual Q5 min PRN Alford Highland, MD   0.4 mg at 10/21/16 1719  . ofloxacin (OCUFLOX) 0.3 % ophthalmic solution 1 drop  1 drop Left Eye QID Shaune Pollack, MD   1 drop at 10/22/16 1800  . ondansetron (ZOFRAN) tablet 4 mg  4 mg Oral Q6H PRN Alford Highland, MD       Or  . ondansetron [facility]) injection 4 mg  4 mg Intravenous Q6H PRN Alford Highland, MD   4 mg at 10/19/16 1920  . prednisoLONE acetate (PRED FORTE) 1 % ophthalmic suspension 1 drop  1 drop Left Eye QID Alford Highland, MD   1 drop at 10/22/16 1800     Discharge Medications: Please see discharge summary for a list of discharge medications.  Relevant Imaging  Results:  Relevant Lab Results:   Additional Information SSN [id number]  [name], [place]

## 2016-10-22 NOTE — Care Management (Signed)
Adult protective services contacted this RNCM for patient update. THis RNCM gave Nann RNCM's contact number as I am no longer involved with patient progression.

## 2016-10-22 NOTE — Progress Notes (Signed)
[facility] [place] at [facility]   PATIENT NAME: Robin [name]    MR#:  [id number]  DATE OF BIRTH:  1940-08-02  SUBJECTIVE:  CHIEF COMPLAINT:   Chief Complaint  Patient presents with  . Fall  . Bradycardia   No chest pain. Afib with RVR, HR was up to 140 last night, put on amiodarone drip but bradycardia at 49 this am. REVIEW OF SYSTEMS:  Review of Systems  Constitutional: Positive for malaise/fatigue. Negative for chills and fever.  HENT: Negative for sore throat.   Eyes: Negative for blurred vision and double vision.  Respiratory: Negative for cough, hemoptysis, shortness of breath, wheezing and stridor.   Cardiovascular: Negative for chest pain, palpitations, orthopnea and leg swelling.  Gastrointestinal: Positive for constipation. Negative for abdominal pain, blood in stool, diarrhea, melena, nausea and vomiting.  Genitourinary: Negative for dysuria, flank pain and hematuria.  Musculoskeletal: Negative for back pain and joint pain.  Neurological: Positive for weakness. Negative for dizziness, sensory change, focal weakness, seizures, loss of consciousness and headaches.  Endo/Heme/Allergies: Negative for polydipsia.  Psychiatric/Behavioral: Negative for depression. The patient is not nervous/anxious.     DRUG ALLERGIES:   Allergies  Allergen Reactions  . Morphine And Related Other (See Comments)    Patient states it makes her "space out"   VITALS:  Blood pressure (!) 121/41, pulse (!) 49, temperature (!) 97.4 F (36.3 C), temperature source Axillary, resp. rate 17, height  (1.626 m), weight 151 lb 14.4 oz (68.9 kg), SpO2 100 %. PHYSICAL EXAMINATION:  Physical Exam  Constitutional: She is oriented to person, place, and time and well-developed, well-nourished, and in no distress.  HENT:  Head: Normocephalic.  Mouth/Throat: Oropharynx is clear and moist.  Eyes: Pupils are equal, round, and reactive to light. Conjunctivae and EOM are normal.  No scleral icterus.  Neck: Normal range of motion. Neck supple. No JVD present. No tracheal deviation present.  Cardiovascular: Normal heart sounds.  Exam reveals no gallop.   No murmur heard. Irregular rate and rhythm  Pulmonary/Chest: Effort normal and breath sounds normal. No respiratory distress. She has no wheezes. She has no rales.  Abdominal: Soft. Bowel sounds are normal. She exhibits no distension. There is no tenderness. There is no rebound.  Musculoskeletal: Normal range of motion. She exhibits no edema or tenderness.  Neurological: She is alert and oriented to person, place, and time. No cranial nerve deficit.  Skin: No rash noted. No erythema.  Psychiatric: Affect normal.   LABORATORY PANEL:  Female CBC  Recent Labs Lab 10/20/16 0541 10/21/16 0003  WBC 10.0  --   HGB 7.7* 8.0*  HCT 25.8*  --   PLT 329  --    ------------------------------------------------------------------------------------------------------------------ Chemistries   Recent Labs Lab 10/19/16 0958 10/20/16 0541  10/22/16 0648  NA 143 145  < > 141  K 4.3 4.2  < > 5.0  CL 114* 115*  < > 112*  CO2 20* 22  < > 21*  GLUCOSE 218* 85  < > 127*  BUN 26* 29*  < > 30*  CREATININE 1.58* 1.56*  < > 1.76*  CALCIUM 8.3* 8.5*  < > 8.4*  MG  --  1.8  --   --   AST 21  --   --   --   ALT 17  --   --   --   ALKPHOS 147*  --   --   --   BILITOT 1.0  --   --   --   < > =  values in this interval not displayed. RADIOLOGY:  No results found. ASSESSMENT AND PLAN:   1. Severe bradycardia with junctional rhythm with syncopal episode.  Improved after discontinuing amiodarone, Lopressor and Cardizem. But had Afib with RVR,, put on Amiodarone drip and lopressor last night. However, she had bradycardia at 40 this morning. Dr. Lady Gary discontinued the amiodarone drip. Resumed Eliquis.  2. Acute kidney injury on chronic kidney disease stage III. Stable. Hold Gentle IV fluid hydration, follow-up BMP. Hold Lasix.  3.  Essential hypertension.  On lopressor. IV hydralazine when necessary.  4. Head trauma on Eliquis. Stable. 5. Chronic Atrial fibrillation history.  Per Dr. Lady Gary, resumed Eliquis. started lopressor last night.  6. Lactic acidosis.  Improved. 7. Anemia of chronic disease. Stable. 8. Type 2 diabetes mellitus put on sliding scale and hold oral diabetic medications 9. Hyperlipidemia unspecified on atorvastatin 10. Chronic systolic CHF with ejection fraction 15%.  No signs of congestive heart failure at this point. Lasix, Lopressor and lisinopril are on hold due to ARF on CKD and bradycardia.  Generalized weakness. PT evaluation: SNF. I discussed with Dr. Lady Gary. All the records are reviewed and case discussed with Care Management/Social Worker. Management plans discussed with the patient, her husand and they are in agreement.  CODE STATUS: Full Code  TOTAL TIME TAKING CARE OF THIS PATIENT: 35 minutes.   More than 50% of the time was spent in counseling/coordination of care: YES  POSSIBLE D/C IN 1-2 DAYS, DEPENDING ON CLINICAL CONDITION.   Shaune Pollack M.D on 10/22/2016 at 2:15 PM  Between 7am to 6pm - Pager - [phone]  After 6pm go to [email or web address] - Social research officer, government  [facility] [place] [facility]  Office  [phone]  CC: Primary care physician; Leanna Sato, MD  Note: This dictation was prepared with Dragon dictation along with smaller phrase technology. Any transcriptional errors that result from this process are unintentional.

## 2016-10-23 LAB — BASIC METABOLIC PANEL
Anion gap: 6 (ref 5–15)
BUN: 36 mg/dL — AB (ref 6–20)
CHLORIDE: 113 mmol/L — AB (ref 101–111)
CO2: 23 mmol/L (ref 22–32)
CREATININE: 2 mg/dL — AB (ref 0.44–1.00)
Calcium: 8.4 mg/dL — ABNORMAL LOW (ref 8.9–10.3)
GFR calc Af Amer: 27 mL/min — ABNORMAL LOW (ref >60)
GFR calc non Af Amer: 23 mL/min — ABNORMAL LOW (ref >60)
Glucose, Bld: 155 mg/dL — ABNORMAL HIGH (ref 65–99)
Potassium: 4.7 mmol/L (ref 3.5–5.1)
Sodium: 142 mmol/L (ref 135–145)

## 2016-10-23 LAB — GLUCOSE, CAPILLARY
GLUCOSE-CAPILLARY: 143 mg/dL — AB (ref 65–99)
Glucose-Capillary: 182 mg/dL — ABNORMAL HIGH (ref 65–99)
Glucose-Capillary: 147 mg/dL — ABNORMAL HIGH (ref 65–99)
Glucose-Capillary: 157 mg/dL — ABNORMAL HIGH (ref 65–99)

## 2016-10-23 MED ORDER — DIPHENHYDRAMINE HCL 25 MG PO CAPS: 25.0000 mg | ORAL_CAPSULE | Freq: Every evening | ORAL | Status: DC | PRN

## 2016-10-23 MED ORDER — SODIUM CHLORIDE 0.9 % IV SOLN
INTRAVENOUS | Status: DC
  Administered 2016-10-23 (×2): via INTRAVENOUS

## 2016-10-23 MED ORDER — MELATONIN 5 MG PO TABS
5.0000 mg | ORAL_TABLET | Freq: Every day | ORAL | Status: DC
  Administered 2016-10-23 – 2016-10-24 (×3): 5 mg via ORAL
  Filled 2016-10-23 (×5): qty 1

## 2016-10-23 MED ORDER — LORATADINE 10 MG PO TABS: 10.0000 mg | ORAL_TABLET | Freq: Every day | ORAL | Status: DC | PRN

## 2016-10-23 NOTE — Care Management (Signed)
Attending relayed that patient declining to go to skilled nursing.  Spoke with CSW regarding whether patient can make this decision if guardianship proceedings have truly been initiated.

## 2016-10-23 NOTE — Clinical Social Work Note (Signed)
CSW spoke to patient's daughter Fulton Mole [phone] who lives in [place].  She stated they do not want patient to go to a SNF and would rather have patient go home.  Patient's daughter stated she is planning to pick patient up on Saturday to bring her to [place] to live with her.  Patient's daughter reported that she has already made appointments for a PCP, cardiologist, and eye doctor for patient in .  Fulton Mole states that once patient discharges from hospital, they have made arrangements for her to have 24 hour supervision by other family members who live locally.    Patient has a APS worker [place] [phone] who is following patient's progress.   APS is requesting to have psych see patient to determine if patient has capacity to make her own decisions.  APS would like to be updated on patient's progress.  Ervin Knack. Hassan Rowan, MSW, Theresia Majors [phone]  10/23/2016 3:47 PM

## 2016-10-23 NOTE — Consult Note (Signed)
I will see the patient in the morning.

## 2016-10-23 NOTE — Progress Notes (Signed)
Physical Therapy Treatment Patient Details Name: Robin [name] MRN: [id number] DOB: [birth year] Today's Date: 10/23/2016    History of Present Illness 76 yo Female came to ED with history of A-fib and had syncope episode from bradycardia. Concerned that patient is not taking meds correctly; She has been living with her children intermittently and has a friend who also helps her. Her daughters are POA. One daughter from [place] will be coming on 8/18 and will be taking her mom back to [place] to stay permanently.     PT Comments    Pt sitting edge of bed with nursing upon arrival after using commode.  Willing to participate in session.  She was able to sit on the edge of bed without physical assist today but continues to require supervision for safety.  She stood with walker and transferred to chair at bedside.  Overall poor safety as she pulled up on walker despite verbal and tactile cues to correct.  While turning she needed moderate verbal and tactile cues to turn her feet and sat abruptly before turning fully or reaching.  Pt stated she was aware of safety concerns but stated "I'm tired".  Participated in exercises as described below.  Pt with poor engagement with exercises despite verbal and tactile cues.  She kept her eyes closed through exercises but opened them once session was complete.  Further gait deferred due to general safety concerns and not fully engaged in session.    Pt remains appropriate for SNF due to functional limitations.  If pt/family refuse SNF/Rehab she would benefit from a walker, commode and possibly a wheelchair for transport as gait remains limited and generally unsafe.     Follow Up Recommendations  SNF     Equipment Recommendations  Rolling walker with 5" wheels;3in1 (PT) , wheelchair   Recommendations for Other Services       Precautions / Restrictions Precautions Precautions: Fall Restrictions Weight Bearing Restrictions: No    Mobility  Bed Mobility Overal  bed mobility: Needs Assistance             General bed mobility comments: sitting edge of bed upon arrival with nursing after using commode  Transfers Overall transfer level: Needs assistance Equipment used: Rolling walker (2 wheeled) Transfers: Sit to/from Stand Sit to Stand: Min assist         General transfer comment: required cues for forward weight shift and to increase push through LE for transfer;   Ambulation/Gait Ambulation/Gait assistance: Min assist Ambulation Distance (Feet): 3 Feet Assistive device: Rolling walker (2 wheeled) Gait Pattern/deviations: Step-to pattern;Decreased step length - right;Decreased step length - left Gait velocity: decreased   General Gait Details: needed significant cues to step towards chair.  pt sat without warning upon reaching chair and did not reach back for the chair prior.     Stairs            Wheelchair Mobility    Modified Rankin (Stroke Patients Only)       Balance Overall balance assessment: Needs assistance Sitting-balance support: Bilateral upper extremity supported;Feet supported Sitting balance-Leahy Scale: Fair Sitting balance - Comments: able to sit without support today but shoudl not be left unattended for safety.   Standing balance support: Bilateral upper extremity supported Standing balance-Leahy Scale: Fair Standing balance comment: gererally unsteady and impulsive when fatigued.                            Cognition Arousal/Alertness: Administrator, Civil Service  Behavior During Therapy: Flat affect Overall Cognitive Status: Difficult to assess                                 General Comments: does have history of cognitive impairment; unsure if this is baseline      Exercises Other Exercises Other Exercises: marching in place x 10 with poor step height.  seated ankle pumps and LAQ but required verbal and tactile cues with poor effort.    General Comments        Pertinent  Vitals/Pain Pain Assessment: No/denies pain    Home Living                      Prior Function            PT Goals (current goals can now be found in the care plan section) Progress towards PT goals: Progressing toward goals    Frequency    Min 2X/week      PT Plan Current plan remains appropriate    Co-evaluation              AM-PAC PT "6 Clicks" Daily Activity  Outcome Measure  Difficulty turning over in bed (including adjusting bedclothes, sheets and blankets)?: A Lot Difficulty moving from lying on back to sitting on the side of the bed? : Total Difficulty sitting down on and standing up from a chair with arms (e.g., wheelchair, bedside commode, etc,.)?: Total Help needed moving to and from a bed to chair (including a wheelchair)?: A Lot Help needed walking in hospital room?: A Lot Help needed climbing 3-5 steps with a railing? : Total 6 Click Score: 9    End of Session Equipment Utilized During Treatment: Gait belt Activity Tolerance: Patient limited by fatigue;Patient limited by lethargy Patient left: in chair;with chair alarm set;with call bell/phone within reach         Time: 1000-1016 PT Time Calculation (min) (ACUTE ONLY): 16 min  Charges:  $Therapeutic Exercise: 8-22 mins                    G Codes:       Danielle Dess, PTA 10/23/16, 11:12 AM

## 2016-10-23 NOTE — Progress Notes (Addendum)
[facility] - [place] at [facility]   PATIENT NAME: Robin [name]    MR#:  [id number]  DATE OF BIRTH:  26-Aug-1940  SUBJECTIVE:  CHIEF COMPLAINT:   Chief Complaint  Patient presents with  . Fall  . Bradycardia   No chest pain. She Still had bradycardia at 50's, off amiodarone drip and lopressor. REVIEW OF SYSTEMS:  Review of Systems  Constitutional: Positive for malaise/fatigue. Negative for chills and fever.  HENT: Negative for sore throat.   Eyes: Negative for blurred vision and double vision.  Respiratory: Negative for cough, hemoptysis, shortness of breath, wheezing and stridor.   Cardiovascular: Negative for chest pain, palpitations, orthopnea and leg swelling.  Gastrointestinal: Negative for abdominal pain, blood in stool, constipation, diarrhea, melena, nausea and vomiting.  Genitourinary: Negative for dysuria, flank pain and hematuria.  Musculoskeletal: Negative for back pain and joint pain.  Neurological: Positive for weakness. Negative for dizziness, sensory change, focal weakness, seizures, loss of consciousness and headaches.  Endo/Heme/Allergies: Negative for polydipsia.  Psychiatric/Behavioral: Negative for depression. The patient is not nervous/anxious.     DRUG ALLERGIES:   Allergies  Allergen Reactions  . Morphine And Related Other (See Comments)    Patient states it makes her "space out"   VITALS:  Blood pressure (!) 154/59, pulse (!) 57, temperature 97.7 F (36.5 C), temperature source Oral, resp. rate 18, height  (1.626 m), weight 151 lb 14.4 oz (68.9 kg), SpO2 100 %. PHYSICAL EXAMINATION:  Physical Exam  Constitutional: She is oriented to person, place, and time and well-developed, well-nourished, and in no distress.  HENT:  Head: Normocephalic.  Mouth/Throat: Oropharynx is clear and moist.  Eyes: Pupils are equal, round, and reactive to light. Conjunctivae and EOM are normal. No scleral icterus.  Neck: Normal range of  motion. Neck supple. No JVD present. No tracheal deviation present.  Cardiovascular: Normal heart sounds.  Exam reveals no gallop.   No murmur heard. Irregular rate and rhythm  Pulmonary/Chest: Effort normal and breath sounds normal. No respiratory distress. She has no wheezes. She has no rales.  Abdominal: Soft. Bowel sounds are normal. She exhibits no distension. There is no tenderness. There is no rebound.  Musculoskeletal: Normal range of motion. She exhibits no edema or tenderness.  Neurological: She is alert and oriented to person, place, and time. No cranial nerve deficit.  Skin: No rash noted. No erythema.  Psychiatric: Affect normal.   LABORATORY PANEL:  Female CBC  Recent Labs Lab 10/20/16 0541 10/21/16 0003  WBC 10.0  --   HGB 7.7* 8.0*  HCT 25.8*  --   PLT 329  --    ------------------------------------------------------------------------------------------------------------------ Chemistries   Recent Labs Lab 10/19/16 0958 10/20/16 0541  10/23/16 0613  NA 143 145  < > 142  K 4.3 4.2  < > 4.7  CL 114* 115*  < > 113*  CO2 20* 22  < > 23  GLUCOSE 218* 85  < > 155*  BUN 26* 29*  < > 36*  CREATININE 1.58* 1.56*  < > 2.00*  CALCIUM 8.3* 8.5*  < > 8.4*  MG  --  1.8  --   --   AST 21  --   --   --   ALT 17  --   --   --   ALKPHOS 147*  --   --   --   BILITOT 1.0  --   --   --   < > = values in this  interval not displayed. RADIOLOGY:  No results found. ASSESSMENT AND PLAN:   1. Severe bradycardia with junctional rhythm with syncopal episode.  Improved after discontinuing amiodarone, Lopressor and Cardizem. But had Afib with RVR, put on Amiodarone drip and lopressor. However, she had bradycardia again. Dr. Lady Gary discontinued the amiodarone drip and lopressor. Resumed Eliquis.  2. Acute kidney injury on chronic kidney disease stage III. Stable. start gentle IV fluid hydration, follow-up BMP. Hold Lasix.  3. Essential hypertension.   IV hydralazine when  necessary. hold Lopressor.  4. Head trauma on Eliquis. Stable. 5. Chronic Atrial fibrillation history.  Per Dr. Lady Gary, resumed Eliquis. Off amiodarone and Lopressor.  6. Lactic acidosis.  Improved. 7. Anemia of chronic disease. Stable. 8. Type 2 diabetes mellitus put on sliding scale and hold oral diabetic medications 9. Hyperlipidemia unspecified on atorvastatin 10. Chronic systolic CHF with ejection fraction 15%.  No signs of congestive heart failure at this point. Lasix, Lopressor and lisinopril are on hold due to ARF on CKD and bradycardia.  Possible early dementia. Psych consult for capacity.  Generalized weakness. PT evaluation: SNF. But her daughter wants her to be discharged to home. Her daughter, Fulton Mole, we will brought the patient to her home in [place] this Saturday. Her daughter already set up appointment with primary care physician and cardiologist.  I discussed with Dr. Lady Gary. All the records are reviewed and case discussed with Care Management/Social Worker. Management plans discussed with the patient, her  duaghter and they are in agreement.  CODE STATUS: Full Code  TOTAL TIME TAKING CARE OF THIS PATIENT: 42 minutes.   More than 50% of the time was spent in counseling/coordination of care: YES  POSSIBLE D/C IN 1-2 DAYS, DEPENDING ON CLINICAL CONDITION.   Shaune Pollack M.D on 10/23/2016 at 1:49 PM  Between 7am to 6pm - Pager - [phone]  After 6pm go to [email or web address] - Social research officer, government  [facility] [place] [facility]  Office  [phone]  CC: Primary care physician; Leanna Sato, MD  Note: This dictation was prepared with Dragon dictation along with smaller phrase technology. Any transcriptional errors that result from this process are unintentional.

## 2016-10-24 ENCOUNTER — Inpatient Hospital Stay: Payer: Medicare Other

## 2016-10-24 ENCOUNTER — Encounter: Payer: Self-pay | Admitting: Psychiatry

## 2016-10-24 DIAGNOSIS — F0390 Unspecified dementia without behavioral disturbance: Secondary | ICD-10-CM

## 2016-10-24 LAB — CBC
HCT: 29 % — ABNORMAL LOW (ref 35.0–47.0)
Hemoglobin: 8.7 g/dL — ABNORMAL LOW (ref 12.0–16.0)
MCH: 24.7 pg — AB (ref 26.0–34.0)
MCHC: 30 g/dL — AB (ref 32.0–36.0)
MCV: 82.4 fL (ref 80.0–100.0)
PLATELETS: 352 10*3/uL (ref 150–440)
RBC: 3.52 MIL/uL — ABNORMAL LOW (ref 3.80–5.20)
RDW: 21.7 % — ABNORMAL HIGH (ref 11.5–14.5)
WBC: 7.1 10*3/uL (ref 3.6–11.0)

## 2016-10-24 LAB — BASIC METABOLIC PANEL
Anion gap: 8 (ref 5–15)
BUN: 36 mg/dL — ABNORMAL HIGH (ref 6–20)
CO2: 21 mmol/L — AB (ref 22–32)
CREATININE: 1.99 mg/dL — AB (ref 0.44–1.00)
Calcium: 8.4 mg/dL — ABNORMAL LOW (ref 8.9–10.3)
Chloride: 115 mmol/L — ABNORMAL HIGH (ref 101–111)
GFR calc Af Amer: 27 mL/min — ABNORMAL LOW (ref >60)
GFR calc non Af Amer: 23 mL/min — ABNORMAL LOW (ref >60)
GLUCOSE: 147 mg/dL — AB (ref 65–99)
Potassium: 4.5 mmol/L (ref 3.5–5.1)
Sodium: 144 mmol/L (ref 135–145)

## 2016-10-24 LAB — GLUCOSE, CAPILLARY
GLUCOSE-CAPILLARY: 121 mg/dL — AB (ref 65–99)
Glucose-Capillary: 128 mg/dL — ABNORMAL HIGH (ref 65–99)
Glucose-Capillary: 93 mg/dL (ref 65–99)
Glucose-Capillary: 148 mg/dL — ABNORMAL HIGH (ref 65–99)

## 2016-10-24 MED ORDER — ALPRAZOLAM 0.25 MG PO TABS
0.5000 mg | ORAL_TABLET | Freq: Two times a day (BID) | ORAL | Status: DC
  Administered 2016-10-24: 0.5 mg via ORAL
  Filled 2016-10-24: qty 2

## 2016-10-24 MED ORDER — HYDRALAZINE HCL 25 MG PO TABS: 25.0000 mg | ORAL_TABLET | Freq: Three times a day (TID) | ORAL | Status: DC

## 2016-10-24 MED ORDER — LORAZEPAM 1 MG PO TABS: 1.0000 mg | ORAL_TABLET | Freq: Every day | ORAL | Status: DC

## 2016-10-24 MED ORDER — SODIUM CHLORIDE 0.9 % IV BOLUS (SEPSIS)
500.0000 mL | Freq: Once | INTRAVENOUS | Status: AC
  Administered 2016-10-24: 500 mL via INTRAVENOUS

## 2016-10-24 MED ORDER — HYDRALAZINE HCL 25 MG PO TABS
25.0000 mg | ORAL_TABLET | Freq: Three times a day (TID) | ORAL | Status: DC
  Administered 2016-10-24 – 2016-10-25 (×3): 25 mg via ORAL
  Filled 2016-10-24 (×3): qty 1

## 2016-10-24 MED ORDER — MIRTAZAPINE 15 MG PO TBDP
7.5000 mg | ORAL_TABLET | Freq: Every day | ORAL | Status: DC
  Administered 2016-10-24: 7.5 mg via ORAL
  Filled 2016-10-24 (×2): qty 0.5

## 2016-10-24 MED ORDER — OXYCODONE-ACETAMINOPHEN 5-325 MG PO TABS
1.0000 | ORAL_TABLET | Freq: Four times a day (QID) | ORAL | Status: DC | PRN
Start: 2016-10-24 — End: 2016-10-25
  Administered 2016-10-24: 1 via ORAL
  Filled 2016-10-24: qty 1

## 2016-10-24 NOTE — Progress Notes (Signed)
Notified of poor UOP over last 2 days. Reportedly incontinent only once. The patient has been on NS @ 75 ml/hr but bladder contents nearly immeasurable. She is reportedly eating well. Kidney function has declined marginally since May. Try 0.5 L bolus.  Continue to monitor.

## 2016-10-24 NOTE — Progress Notes (Signed)
       [facility]  SUBJECTIVE: somewhat confused and intermitantly agitated.    Vitals:   10/23/16 0726 10/23/16 1225 10/23/16 1952 10/24/16 0333  BP: (!) 135/40 (!) 154/59 (!) 150/53 (!) 158/52  Pulse: (!) 53 (!) 57 (!) 57 62  Resp:  18 17 18   Temp:  97.7 F (36.5 C) (!) 97.5 F (36.4 C) (!) 97.5 F (36.4 C)  TempSrc:  Oral Oral Oral  SpO2: 98% 100% 92% 96%  Weight:      Height:        Intake/Output Summary (Last 24 hours) at 10/24/16 0716 Last data filed at 10/24/16 0300  Gross per 24 hour  Intake              840 ml  Output              100 ml  Net              740 ml    LABS: Basic Metabolic Panel:  Recent Labs  95/63/87 0613 10/24/16 0506  NA 142 144  K 4.7 4.5  CL 113* 115*  CO2 23 21*  GLUCOSE 155* 147*  BUN 36* 36*  CREATININE 2.00* 1.99*  CALCIUM 8.4* 8.4*   Liver Function Tests: No results for input(s): AST, ALT, ALKPHOS, BILITOT, PROT, ALBUMIN in the last 72 hours. No results for input(s): LIPASE, AMYLASE in the last 72 hours. CBC:  Recent Labs  10/24/16 0506  WBC 7.1  HGB 8.7*  HCT 29.0*  MCV 82.4  PLT 352   Cardiac Enzymes:  Recent Labs  10/21/16 1130  TROPONINI <0.03   BNP: Invalid input(s): POCBNP D-Dimer: No results for input(s): DDIMER in the last 72 hours. Hemoglobin A1C: No results for input(s): HGBA1C in the last 72 hours. Fasting Lipid Panel: No results for input(s): CHOL, HDL, LDLCALC, TRIG, CHOLHDL, LDLDIRECT in the last 72 hours. Thyroid Function Tests: No results for input(s): TSH, T4TOTAL, T3FREE, THYROIDAB in the last 72 hours.  Invalid input(s): FREET3 Anemia Panel: No results for input(s): VITAMINB12, FOLATE, FERRITIN, TIBC, IRON, RETICCTPCT in the last 72 hours.   Physical Exam: Blood pressure (!) 158/52, pulse 62, temperature (!) 97.5 F (36.4 C), temperature source Oral, resp. rate 18, height 5\' 4"  (1.626 m), weight 68.9 kg (151 lb 14.4 oz), SpO2 96 %.    Wt Readings from Last 1 Encounters:  10/20/16 68.9 kg (151 lb 14.4 oz)     General appearance: distracted and mild distress Resp: clear to auscultation bilaterally Cardio: regular rate and rhythm GI: abnormal findings:  mild tenderness in the epigastrium Extremities: extremities normal, atraumatic, no cyanosis or edema Neurologic: Grossly normal  TELEMETRY: Reviewed telemetry pt in currently nsr.   ASSESSMENT AND PLAN:  Active Problems:   Bradycardia-resolved at present    Atrial fibrillation, rapid (HCC)-currently in nsr. No tachy episodes last pm noted. Currently on apixaban with concern over bleeding risk given confusion. May need to stop this at discharge depending on disposition.   CKD-renal function remains somewhat elevated over baseline.      Dalia Heading, MD, [facility] 10/24/2016 7:16 AM

## 2016-10-24 NOTE — Progress Notes (Signed)
Pt throughout the evening with anxiety, agitation. Pt states, " I am tired, but I don"t want to go to sleep". When asked to explain, pt unable to tell nurse. I encouraged calming techniques. Pt complains that stomach is hurting, and then turns around and is asking for some marshmallows to eat.  Suggested to pt that maybe she should not try and eat anything, to help and calm her stomach. Pt insisting on eating marshmallows.

## 2016-10-24 NOTE — Discharge Instructions (Signed)
Heart healthy and ADA diet. Follow up new PCP and cardiologist in  in 1 week. Fall precaution.

## 2016-10-24 NOTE — Progress Notes (Signed)
Pt is agitated and restless, not wanting to stay in bed. MD notified. Orders placed. Will continue monitor and assess.

## 2016-10-24 NOTE — Care Management Important Message (Signed)
Important Message  Patient Details  Name: Robin [name] MRN: [id number] Date of Birth: 1940/11/26   Medicare Important Message Given:  Yes  Signed IM notice given   Eber Hong, RN 10/24/2016, 2:21 PM
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
## 2016-10-24 NOTE — Progress Notes (Signed)
PT Cancellation Note  Patient Details Name: Robin [name] MRN: [id number] DOB: 1940-11-23   Cancelled Treatment:    Reason Eval/Treat Not Completed:  (Pt refused) Pt found with sitting EOB with CNA in room. Pt refused PT at this time, stating her legs are tired, the doctor doesn't want her to move. PT educated pt that MD does wish pt to move, that's why PT has been ordered. CNA also encouraged pt to participate in PT, but pt still declined and said she'll "do it tomorrow." PT will re-attempt at later date/time, as pt is willing to participate.  Zerita Boers, PT,DPT 10/24/16 11:26 AM Phone: [phone] Fax: [phone]     Mellissa Conley L 10/24/2016, 11:24 AM

## 2016-10-24 NOTE — Care Management (Signed)
Spoke with patient's daughter Fulton Mole and informed that patient does not have oxygen at home and not receiving any home health services.  Updated primary nurse of need to wean and or assess for home 02 because patient's current oxygen requirement is acute and not chronic as once thought

## 2016-10-24 NOTE — Progress Notes (Addendum)
Pt stating abd pain. Little urine output. Bladder scan done. MD notified. Orders placed.

## 2016-10-24 NOTE — Progress Notes (Addendum)
[employer] [facility] [place] [facility]   PATIENT NAME: Robin [name]    MR#:  [id number]  DATE OF BIRTH:  1941-02-09  SUBJECTIVE:  CHIEF COMPLAINT:   Chief Complaint  Patient presents with  . Fall  . Bradycardia   SOB and weakness. REVIEW OF SYSTEMS:  Review of Systems  Constitutional: Positive for malaise/fatigue. Negative for chills and fever.  HENT: Negative for sore throat.   Eyes: Negative for blurred vision and double vision.  Respiratory: Positive for shortness of breath. Negative for cough, hemoptysis, wheezing and stridor.   Cardiovascular: Negative for chest pain, palpitations, orthopnea and leg swelling.  Gastrointestinal: Negative for abdominal pain, blood in stool, constipation, diarrhea, melena, nausea and vomiting.  Genitourinary: Negative for dysuria, flank pain and hematuria.  Musculoskeletal: Negative for back pain and joint pain.  Neurological: Positive for weakness. Negative for dizziness, sensory change, focal weakness, seizures, loss of consciousness and headaches.  Endo/Heme/Allergies: Negative for polydipsia.  Psychiatric/Behavioral: Negative for depression. The patient is nervous/anxious.     DRUG ALLERGIES:   Allergies  Allergen Reactions  . Morphine And Related Other (See Comments)    Patient states it makes her "space out"   VITALS:  Blood pressure (!) 178/60, pulse 64, temperature 98.1 F (36.7 C), resp. rate 18, height  (1.626 m), weight 151 lb 14.4 oz (68.9 kg), SpO2 94 %. PHYSICAL EXAMINATION:  Physical Exam  Constitutional: She is oriented to person, place, and time and well-developed, well-nourished, and in no distress.  HENT:  Head: Normocephalic.  Mouth/Throat: Oropharynx is clear and moist.  Eyes: Pupils are equal, round, and reactive to light. Conjunctivae and EOM are normal. No scleral icterus.  Neck: Normal range of motion. Neck supple. No JVD present. No tracheal deviation present.  Cardiovascular: Normal  rate and normal heart sounds.  Exam reveals no gallop.   No murmur heard. irregular rate and rhythm  Pulmonary/Chest: Effort normal and breath sounds normal. No respiratory distress. She has no wheezes. She has no rales.  Abdominal: Soft. Bowel sounds are normal. She exhibits no distension. There is no tenderness. There is no rebound.  Musculoskeletal: Normal range of motion. She exhibits no edema or tenderness.  Neurological: She is alert and oriented to person, place, and time. No cranial nerve deficit.  Skin: No rash noted. No erythema.  Psychiatric: Affect normal.   LABORATORY PANEL:  Female CBC  Recent Labs Lab 10/24/16 0506  WBC 7.1  HGB 8.7*  HCT 29.0*  PLT 352   ------------------------------------------------------------------------------------------------------------------ Chemistries   Recent Labs Lab 10/19/16 0958 10/20/16 0541  10/24/16 0506  NA 143 145  < > 144  K 4.3 4.2  < > 4.5  CL 114* 115*  < > 115*  CO2 20* 22  < > 21*  GLUCOSE 218* 85  < > 147*  BUN 26* 29*  < > 36*  CREATININE 1.58* 1.56*  < > 1.99*  CALCIUM 8.3* 8.5*  < > 8.4*  MG  --  1.8  --   --   AST 21  --   --   --   ALT 17  --   --   --   ALKPHOS 147*  --   --   --   BILITOT 1.0  --   --   --   < > = values in this interval not displayed. RADIOLOGY:  No results found. ASSESSMENT AND PLAN:   1. Severe bradycardia with junctional rhythm with syncopal episode.  Improved after discontinuing amiodarone, Lopressor and Cardizem. But had Afib with RVR, put on Amiodarone drip and lopressor. However, she had bradycardia again. Dr. Lady Gary discontinued the amiodarone drip and lopressor. Resumed Eliquis.  2. Acute kidney injury on chronic kidney disease stage III. Worse than baseline but stable. Hold IV fluid hydration, follow-up BMP. Hold Lasix.  3. Essential hypertension.   IV hydralazine when necessary. hold Lopressor. Resumed hydralazine by mouth every 8 hours  4. Head trauma on Eliquis.  Stable. 5. Chronic Atrial fibrillation history.  Per Dr. Lady Gary, resumed Eliquis. Off amiodarone and Lopressor.  6. Lactic acidosis.  Improved. 7. Anemia of chronic disease. Stable. 8. Type 2 diabetes mellitus put on sliding scale and hold oral diabetic medications 9. Hyperlipidemia unspecified on atorvastatin 10. Chronic systolic CHF with ejection fraction 15%.  No signs of congestive heart failure at this point. Lasix, Lopressor and lisinopril are on hold due to ARF on CKD and bradycardia.  Chronic respiratory failure. Continue nebulizer and oxygen 2 L.  Since the patient complains of shortness breath, get a chest x-ray.  Possible early dementia. Psych consult: The patient does no have capacity.  Generalized weakness. PT evaluation: SNF. But her daughter Fulton Mole wants her to be discharged to home. Her daughter, Fulton Mole, we will brought the patient to her home in  this Saturday. Her daughter already set up appointment with primary care physician and cardiologist.  I discussed with Dr. Lady Gary and Dr. Jennet Maduro. All the records are reviewed and case discussed with Care Management/Social Worker. Management plans discussed with the patient, her  duaghter and they are in agreement.  CODE STATUS: Full Code  TOTAL TIME TAKING CARE OF THIS PATIENT: 35 minutes.   More than 50% of the time was spent in counseling/coordination of care: YES  POSSIBLE D/C IN 1-2 DAYS, DEPENDING ON CLINICAL CONDITION.   Shaune Pollack M.D on 10/24/2016 at 2:01 PM  Between 7am to 6pm - Pager - [phone]  After 6pm go to [email or web address] - Social research officer, government  [facility]  Office  [phone]  CC: Primary care physician; Leanna Sato, MD  Note: This dictation was prepared with Dragon dictation along with smaller phrase technology. Any transcriptional errors that result from this process are unintentional.

## 2016-10-24 NOTE — Care Management (Addendum)
Left message with Robin [name]'s daughter [name] to discuss her plan to take her mother to [place] to live on 8/18 in regards to Robin [name]'s chronic oxygen.  Checking with [facility] to confirm agency is providing oxygen.  Robin [name] says she has a nurse that comes to her home to see her but does not know the name of the agency and says none of my family will know either." Robin [name] is aware of plan to go to [place].  She has declined physical therapy visits.  Discussed  how she is going to get into her house if she can not ambulate.  Robin [name] says she is too weak to go home.  Discussed that skilled nursing placement would be level of care that would address weakness and debility but this level of care is being declined

## 2016-10-24 NOTE — Progress Notes (Signed)
Spoke with dr. Imogene Burn to make aware patients c/o being anxious, nothing ordered but prn ativan at bedtime. Current bp 178/60 only prn hydralazine if sbp greater than 180 is ordered. Per md will place orders at needed

## 2016-10-25 LAB — BASIC METABOLIC PANEL
ANION GAP: 6 (ref 5–15)
BUN: 32 mg/dL — AB (ref 6–20)
CALCIUM: 8.6 mg/dL — AB (ref 8.9–10.3)
CO2: 23 mmol/L (ref 22–32)
Chloride: 116 mmol/L — ABNORMAL HIGH (ref 101–111)
Creatinine, Ser: 1.79 mg/dL — ABNORMAL HIGH (ref 0.44–1.00)
GFR calc Af Amer: 31 mL/min — ABNORMAL LOW (ref >60)
GFR, EST NON AFRICAN AMERICAN: 26 mL/min — AB (ref >60)
GLUCOSE: 101 mg/dL — AB (ref 65–99)
POTASSIUM: 4.6 mmol/L (ref 3.5–5.1)
SODIUM: 145 mmol/L (ref 135–145)

## 2016-10-25 LAB — GLUCOSE, CAPILLARY
GLUCOSE-CAPILLARY: 197 mg/dL — AB (ref 65–99)
Glucose-Capillary: 86 mg/dL (ref 65–99)

## 2016-10-25 MED ORDER — FUROSEMIDE 20 MG PO TABS
ORAL_TABLET | ORAL | Status: AC
  Administered 2016-10-25: 10:00:00
  Filled 2016-10-25: qty 1

## 2016-10-25 MED ORDER — HYDRALAZINE HCL 25 MG PO TABS
75.0000 mg | ORAL_TABLET | Freq: Three times a day (TID) | ORAL | Status: DC
  Administered 2016-10-25: 15:00:00 75 mg via ORAL
  Filled 2016-10-25: qty 1

## 2016-10-25 MED ORDER — FUROSEMIDE 20 MG PO TABS
20.0000 mg | ORAL_TABLET | Freq: Every day | ORAL | Status: DC
  Administered 2016-10-25: 20 mg via ORAL

## 2016-10-25 MED ORDER — OXYCODONE-ACETAMINOPHEN 5-325 MG PO TABS: 1.0000 | ORAL_TABLET | Freq: Three times a day (TID) | ORAL | 0 refills | Status: AC | PRN

## 2016-10-25 MED ORDER — FUROSEMIDE 20 MG PO TABS: 20.0000 mg | ORAL_TABLET | Freq: Every day | ORAL | 0 refills | Status: AC

## 2016-10-25 NOTE — Clinical Social Work Note (Signed)
Patient will be discharging to her niece Renata Caprice [address], [phone] in [place], due to patient and family not wanting to go to SNF.  CSW left message with APS worker [place] [phone] to update her that patient is discharging today.  CSW to sign off, please reconsult if other social work needs arise.  Ervin Knack. Jarriel Papillion, MSW, Theresia Majors [phone]  10/25/2016 5:42 PM

## 2016-10-25 NOTE — Care Management (Signed)
Have spoke with primary nurse regarding need to perform home oxygen assessment because if oxygen is needed will need to plan for travel.  Awaiting assessment results.  [place] Holts serves [place] but unsure of exact zip code.  Left voicemail for daughter Fulton Mole

## 2016-10-25 NOTE — Care Management (Signed)
[facility] is able to service locally and patient's address in [place] will be- Donell Sievert: [address] [place] [address]. Phone [phone] .  When patient discharge from [facility] today, She will be going to the home of a niece- Renata Caprice [address]  [place] [address]  Phone number [phone].

## 2016-10-25 NOTE — Progress Notes (Addendum)
Oxygen needs. SATURATION QUALIFICATIONS: (This note is used to comply with regulatory documentation for home oxygen)  Patient Saturations on Room Air at Rest = 92%  On room air Saturations dropped to 86% while she moved herself to the edge of the bed.  After the she stood, her saturations on room air increased to 95%  On 2 L [place] moving in the bed her saturations were 95%.  They remained above 90% while she stool  On 1 L  her saturations dropped to 88% while she moved in bed.  At this time she reports she is too weak to walk.   Please briefly explain why patient needs home oxygen:  Oxygen sats dropped to 86 % while moving herself in the bed.  PT reports her O2 sats dropped to 88% while doing simple leg exercises this morning.

## 2016-10-25 NOTE — Plan of Care (Signed)
Problem: Health Behavior/Discharge Planning: Goal: Ability to manage health-related needs will improve Outcome: Adequate for Discharge Reviewed with patient, friend, and daughter via phone and notes.  Need for oxygen, medication changes, follow up.

## 2016-10-25 NOTE — Progress Notes (Addendum)
Physical Therapy Treatment Patient Details Name: Robin [name] MRN: [id number] DOB: Feb 18, 1941 Today's Date: 10/25/2016    History of Present Illness 76 yo Female came to ED with history of A-fib and had syncope episode from bradycardia. Concerned that patient is not taking meds correctly; She has been living with her children intermittently and has a friend who also helps her. Her daughters are POA. One daughter from [place] will be coming on 8/18 and will be taking her mom back to [place] to stay permanently.     PT Comments    Pt initially refusing therapy but agrees with encouragement.  Bed mobility with mod assist. Attempted to stand with mod a x 1 for 3 attempts but pt with overall poor effort and unable to stand.  +2 assist provided and she was able to stand but continues with poor effort.  She was able to ambulate 8' in room with min/mod a x 1 with assist to steer walker and verbal cues to improve quality.  She required to sit due to fatigue.  Participated in exercises as described below.  Pt with generally poor effort with exercises.  O2 sats monitored.  99% on 2 lpm.  O2 removed and 98% at rest on room air.  After gait she was 98% but gait distance was limited by fatigue.   During exercises she did drop briefly to 87% but returned to low 90's before eventually up to 95% upon leaving pt on room air.   Discussed with primary nurse and tech.    Pt remains appropriate for SNF/rehab at this time due to functional mobility limitations.  If family continues to decline, she will need equipment listed below for mobility/safety.   Follow Up Recommendations  SNF;Other (comment)     Equipment Recommendations  Rolling walker with 5" wheels;3in1 (PT);Wheelchair (measurements PT);Wheelchair cushion (measurements PT)    Recommendations for Other Services       Precautions / Restrictions Precautions Precautions: Fall Restrictions Weight Bearing Restrictions: No    Mobility  Bed Mobility Overal bed  mobility: Needs Assistance Bed Mobility: Supine to Sit;Sit to Supine     Supine to sit: Mod assist;HOB elevated Sit to supine: Min assist;HOB elevated   General bed mobility comments: pt able to initiate but not able to get fully upright wihtout assist.  Transfers Overall transfer level: Needs assistance Equipment used: Rolling walker (2 wheeled) Transfers: Sit to/from Stand Sit to Stand: Min assist;+2 physical assistance;Mod assist         General transfer comment: Pt with poor initiation/assist  to stand and needed +2 assist.    Ambulation/Gait Ambulation/Gait assistance: Min assist Ambulation Distance (Feet): 8 Feet Assistive device: Rolling walker (2 wheeled) Gait Pattern/deviations: Step-through pattern;Decreased step length - right;Decreased step length - left Gait velocity: decreased Gait velocity interpretation: <1.8 ft/sec, indicative of risk for recurrent falls General Gait Details: heavy use of rolling walker with overall poor gait quality.   Stairs            Wheelchair Mobility    Modified Rankin (Stroke Patients Only)       Balance Overall balance assessment: Needs assistance Sitting-balance support: Bilateral upper extremity supported;Feet supported Sitting balance-Leahy Scale: Fair Sitting balance - Comments: able to sit without support today but shoudl not be left unattended for safety.   Standing balance support: Bilateral upper extremity supported Standing balance-Leahy Scale: Poor Standing balance comment: gererally unsteady and impulsive when fatigued.  Cognition Arousal/Alertness: Lethargic Behavior During Therapy: Flat affect Overall Cognitive Status: Difficult to assess                                        Exercises Other Exercises Other Exercises: seated ankle pumps, LAQ, marches and ab/adduction with A/AAROM with poor effort    General Comments        Pertinent  Vitals/Pain Pain Assessment: No/denies pain    Home Living                      Prior Function            PT Goals (current goals can now be found in the care plan section) Progress towards PT goals: Progressing toward goals    Frequency    Min 2X/week      PT Plan Current plan remains appropriate    Co-evaluation              AM-PAC PT "6 Clicks" Daily Activity  Outcome Measure  Difficulty turning over in bed (including adjusting bedclothes, sheets and blankets)?: A Lot Difficulty moving from lying on back to sitting on the side of the bed? : Total Difficulty sitting down on and standing up from a chair with arms (e.g., wheelchair, bedside commode, etc,.)?: Total Help needed moving to and from a bed to chair (including a wheelchair)?: A Lot Help needed walking in hospital room?: A Lot Help needed climbing 3-5 steps with a railing? : Total 6 Click Score: 9    End of Session Equipment Utilized During Treatment: Gait belt Activity Tolerance: Patient limited by fatigue;Patient limited by lethargy Patient left: in chair;with chair alarm set;with call bell/phone within reach;with nursing/sitter in room Nurse Communication: Mobility status;Other (comment)       Time: 9562-1308 PT Time Calculation (min) (ACUTE ONLY): 28 min  Charges:  $Gait Training: 8-22 mins $Therapeutic Exercise: 8-22 mins                    G Codes:       Danielle Dess, PTA 10/25/16, 9:36 AM

## 2016-10-25 NOTE — Progress Notes (Signed)
SATURATION QUALIFICATIONS: (This note is used to comply with regulatory documentation for home oxygen)  Patient Saturations on Room Air at Rest = 92%  Patient Saturations on Room Air while Ambulating = 86%  Patient Saturations on 2 Liters of oxygen while Ambulating = 95%  Please briefly explain why patient needs home oxygen: 

## 2016-10-25 NOTE — Discharge Summary (Signed)
[facility] - [place] at [facility]   PATIENT NAME: Robin [name]    MR#:  [id number]  DATE OF BIRTH:  1940/04/10  DATE OF ADMISSION:  10/19/2016   ADMITTING PHYSICIAN: Alford Highland, MD  DATE OF DISCHARGE: 10/25/2016 PRIMARY CARE PHYSICIAN: Leanna Sato, MD   ADMISSION DIAGNOSIS:  Syncope and collapse [R55] Junctional bradycardia [R00.1] Bradycardia [R00.1] DISCHARGE DIAGNOSIS:  Principal Problem:   Mild dementia Active Problems:   Bradycardia   Atrial fibrillation, rapid (HCC)  SECONDARY DIAGNOSIS:   Past Medical History:  Diagnosis Date  . Afib (HCC)   . Anxiety   . Asthma   . CHF (congestive heart failure) (HCC)   . Coronary artery disease   . Diabetes mellitus without complication (HCC)   . Hyperlipidemia   . Hypertension    HOSPITAL COURSE:   1. Severe bradycardia with junctional rhythm with syncopal episode.  Improved after discontinuing amiodarone, Lopressor and Cardizem. But had Afib with RVR, put on Amiodarone drip and lopressor. However, she had bradycardia again. Dr. Lady Gary discontinued the amiodarone drip and lopressor. Resumed Eliquis.  2. Acute kidney injury on chronic kidney disease stage III. Worse than baseline but stable. Better today. Hold IV fluid hydration. Resume low dose asix at 20 mg po daily.  3. Essential hypertension.   IV hydralazine when necessary. hold Lopressor. Resumed hydralazine by mouth every 8 hours  4. Head trauma on Eliquis. Stable. 5. Chronic Atrial fibrillation history.  Per Dr. Lady Gary, resumed Eliquis. Off amiodarone and Lopressor.  6. Lactic acidosis.  Improved. 7. Anemia of chronic disease. Stable. 8. Type 2 diabetes mellitus put on sliding scale and hold oral diabetic medications 9. Hyperlipidemia unspecified on atorvastatin 10. Chronic systolic CHF with ejection fraction 15%.  Lasix, Lopressor and lisinopril are on hold due to ARF on CKD and bradycardia. Resume low dose lasix due to  pulmonary edema.  She has no chronic respiratory failure.  Off oxygen without hypoxia.  Possible early dementia. Psych consult: The patient does no have capacity.  Generalized weakness. PT evaluation: SNF. But her daughter Fulton Mole wants her to be discharged to home. Her daughter, Fulton Mole, will bring the patient to her home in [place] this Saturday. Her daughter already set up appointment with primary care physician and cardiologist.  DISCHARGE CONDITIONS:  Stable, discharge to home today. CONSULTS OBTAINED:  Treatment Team:  Dalia Heading, MD Jimmy Footman, MD DRUG ALLERGIES:   Allergies  Allergen Reactions  . Morphine And Related Other (See Comments)    Patient states it makes her "space out"   DISCHARGE MEDICATIONS:   Allergies as of 10/25/2016      Reactions   Morphine And Related Other (See Comments)   Patient states it makes her "space out"      Medication List    STOP taking these medications   amiodarone 400 MG tablet Commonly known as:  PACERONE   diltiazem 180 MG 24 hr capsule Commonly known as:  CARDIZEM CD   famotidine 20 MG tablet Commonly known as:  PEPCID   ketorolac 0.5 % ophthalmic solution Commonly known as:  ACULAR   lisinopril 10 MG tablet Commonly known as:  PRINIVIL,ZESTRIL   lisinopril 40 MG tablet Commonly known as:  PRINIVIL,ZESTRIL   metoprolol succinate 100 MG 24 hr tablet Commonly known as:  TOPROL-XL     TAKE these medications   albuterol 1.25 MG/3ML nebulizer solution Commonly known as:  ACCUNEB Take 1.25 mg by nebulization every 4 (four) hours as needed  for wheezing or shortness of breath.   albuterol 108 (90 Base) MCG/ACT inhaler Commonly known as:  PROVENTIL HFA;VENTOLIN HFA Inhale 2 puffs into the lungs.   apixaban 5 MG Tabs tablet Commonly known as:  ELIQUIS Take 5 mg by mouth 2 (two) times daily.   atorvastatin 40 MG tablet Commonly known as:  LIPITOR Take 40 mg by mouth daily.   feeding  supplement (GLUCERNA SHAKE) Liqd Take 237 mLs by mouth 3 (three) times daily between meals.   furosemide 20 MG tablet Commonly known as:  LASIX Take 1 tablet (20 mg total) by mouth daily. What changed:  how much to take  how to take this  when to take this  additional instructions   glipiZIDE 5 MG tablet Commonly known as:  GLUCOTROL Take 0.5 tablets (2.5 mg total) by mouth daily before breakfast.   hydrALAZINE 25 MG tablet Commonly known as:  APRESOLINE Take 75 mg by mouth 3 (three) times daily.   nitroGLYCERIN 0.4 MG SL tablet Commonly known as:  NITROSTAT Place 1 tablet (0.4 mg total) under the tongue every 5 (five) minutes as needed for chest pain.   ofloxacin 0.3 % ophthalmic solution Commonly known as:  OCUFLOX Place 1 drop into the left eye 4 (four) times daily.   oxyCODONE-acetaminophen 5-325 MG tablet Commonly known as:  PERCOCET/ROXICET Take 1 tablet by mouth every 8 (eight) hours as needed for severe pain.   prednisoLONE acetate 1 % ophthalmic suspension Commonly known as:  PRED FORTE Apply 1 drop to eye 4 (four) times daily. Left eye        DISCHARGE INSTRUCTIONS:  See AVS/  If you experience worsening of your admission symptoms, develop shortness of breath, life threatening emergency, suicidal or homicidal thoughts you must seek medical attention immediately by calling 911 or calling your MD immediately  if symptoms less severe.  You Must read complete instructions/literature along with all the possible adverse reactions/side effects for all the Medicines you take and that have been prescribed to you. Take any new Medicines after you have completely understood and accpet all the possible adverse reactions/side effects.   Please note  You were cared for by a hospitalist during your hospital stay. If you have any questions about your discharge medications or the care you received while you were in the hospital after you are discharged, you can call the  unit and asked to speak with the hospitalist on call if the hospitalist that took care of you is not available. Once you are discharged, your primary care physician will handle any further medical issues. Please note that NO REFILLS for any discharge medications will be authorized once you are discharged, as it is imperative that you return to your primary care physician (or establish a relationship with a primary care physician if you do not have one) for your aftercare needs so that they can reassess your need for medications and monitor your lab values.    On the day of Discharge:  VITAL SIGNS:  Blood pressure (!) 159/66, pulse 70, temperature 97.9 F (36.6 C), temperature source Oral, resp. rate 20, height  (1.626 m), weight 151 lb 14.4 oz (68.9 kg), SpO2 99 %. PHYSICAL EXAMINATION:  GENERAL:  76 y.o.-year-old patient lying in the bed with no acute distress.  EYES: Pupils equal, round, reactive to light and accommodation. No scleral icterus. Extraocular muscles intact.  HEENT: Head atraumatic, normocephalic. Oropharynx and nasopharynx clear.  NECK:  Supple, no jugular venous distention. No thyroid  enlargement, no tenderness.  LUNGS: Normal breath sounds bilaterally, no wheezing, rales,rhonchi or crepitation. No use of accessory muscles of respiration.  CARDIOVASCULAR: S1, S2 normal. No murmurs, rubs, or gallops.  ABDOMEN: Soft, non-tender, non-distended. Bowel sounds present. No organomegaly or mass.  EXTREMITIES: No pedal edema, cyanosis, or clubbing.  NEUROLOGIC: Cranial nerves II through XII are intact. Muscle strength 4/5 in all extremities. Sensation intact. Gait not checked.  PSYCHIATRIC: The patient is alert and oriented x 3.  SKIN: No obvious rash, lesion, or ulcer.  DATA REVIEW:   CBC  Recent Labs Lab 10/24/16 0506  WBC 7.1  HGB 8.7*  HCT 29.0*  PLT 352    Chemistries   Recent Labs Lab 10/19/16 0958 10/20/16 0541  10/25/16 0454  NA 143 145  < > 145  K 4.3  4.2  < > 4.6  CL 114* 115*  < > 116*  CO2 20* 22  < > 23  GLUCOSE 218* 85  < > 101*  BUN 26* 29*  < > 32*  CREATININE 1.58* 1.56*  < > 1.79*  CALCIUM 8.3* 8.5*  < > 8.6*  MG  --  1.8  --   --   AST 21  --   --   --   ALT 17  --   --   --   ALKPHOS 147*  --   --   --   BILITOT 1.0  --   --   --   < > = values in this interval not displayed.   Microbiology Results  Results for orders placed or performed during the hospital encounter of 10/19/16  MRSA PCR Screening     Status: None   Collection Time: 10/19/16  5:06 PM  Result Value Ref Range Status   MRSA by PCR NEGATIVE NEGATIVE Final    Comment:        The GeneXpert MRSA Assay (FDA approved for NASAL specimens only), is one component of a comprehensive MRSA colonization surveillance program. It is not intended to diagnose MRSA infection nor to guide or monitor treatment for MRSA infections.     RADIOLOGY:  Dg Chest 1 View  Result Date: 10/24/2016 CLINICAL DATA:  Dyspnea. EXAM: CHEST 1 VIEW COMPARISON:  Chest x-ray dated October 19, 2016. FINDINGS: Prior CABG. Stable cardiomegaly. Atherosclerotic calcification of the aortic arch. Increasing small bilateral pleural effusions with new dense consolidation at the left lung base. Increasing perihilar opacities. No pneumothorax. No acute osseous abnormality. IMPRESSION: 1. Cardiomegaly with increasing small bilateral pleural effusions and pulmonary edema. 2. New dense consolidation in the left lung base may reflect atelectasis, however, pneumonia could have a similar appearance in the proper clinical setting. Electronically Signed   By: Obie Dredge M.D.   On: 10/24/2016 14:51     Management plans discussed with the patient, family and they are in agreement.  CODE STATUS: Full Code   TOTAL TIME TAKING CARE OF THIS PATIENT: 36 minutes.    Shaune Pollack M.D on 10/25/2016 at 10:01 AM  Between 7am to 6pm - Pager - [phone]  After 6pm go to [email or web address] - Air traffic controller  [facility] [place] [facility]  Office  [phone]  CC: Primary care physician; Leanna Sato, MD   Note: This dictation was prepared with Dragon dictation along with smaller phrase technology. Any transcriptional errors that result from this process are unintentional.

## 2016-11-10 DEATH — deceased

## 2017-08-22 IMAGING — US US EXTREM LOW VENOUS*L*
1 series · 14 of 24 positions shown · non-contrast
Comparison: None.

CLINICAL DATA: Left lower extremity swelling

EXAM:
LEFT LOWER EXTREMITY VENOUS DUPLEX ULTRASOUND
TECHNIQUE: Doppler venous assessment of the left lower extremity deep venous
system was performed, including characterization of spectral flow,
compressibility, and phasicity.

[Series 1: us extrem low venous*left* · 0.07mm/px · 14 of 33 slices shown]
[im 1/33]
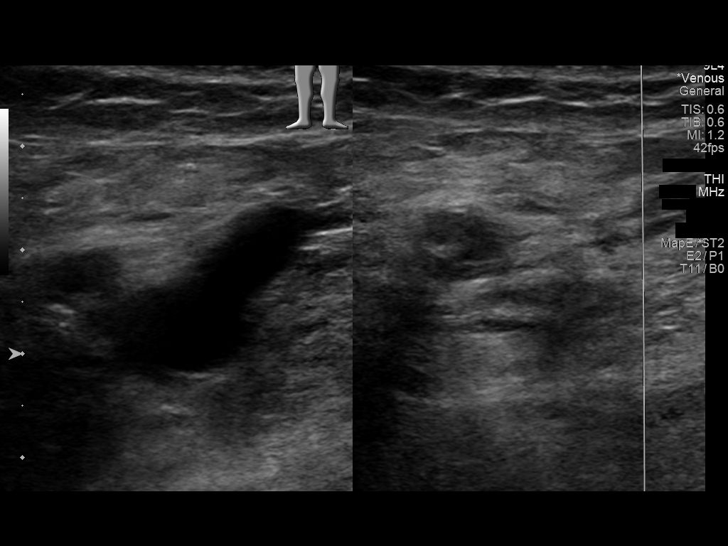
[im 3/33]
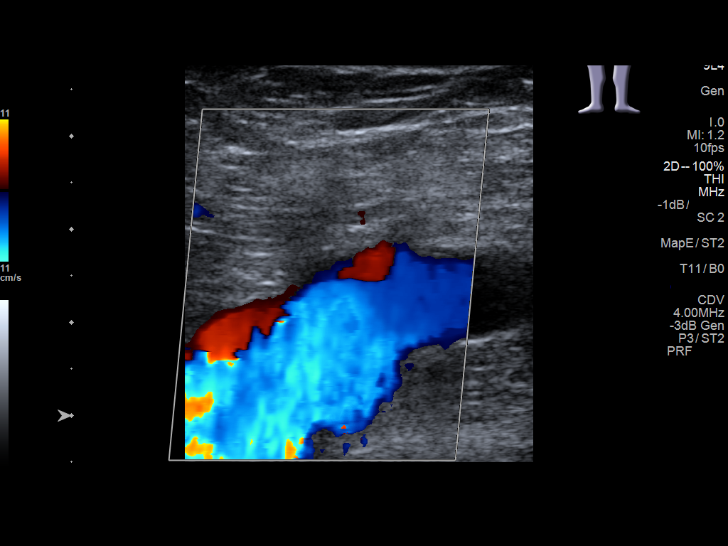
[im 6/33]
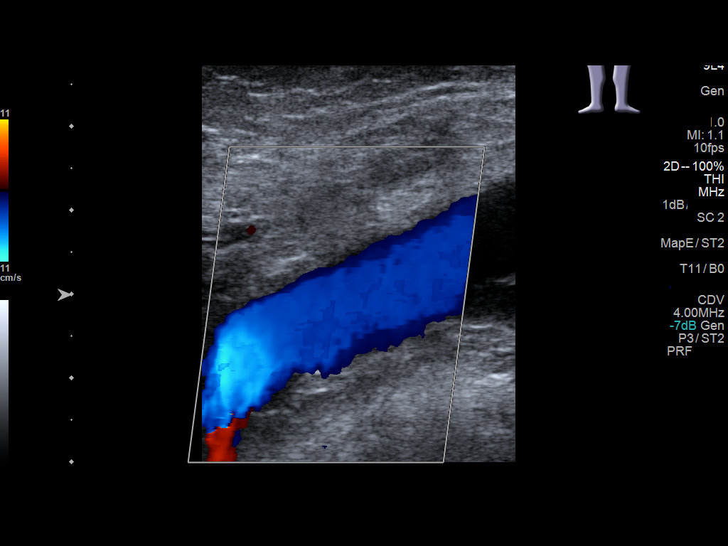
[im 9/33]
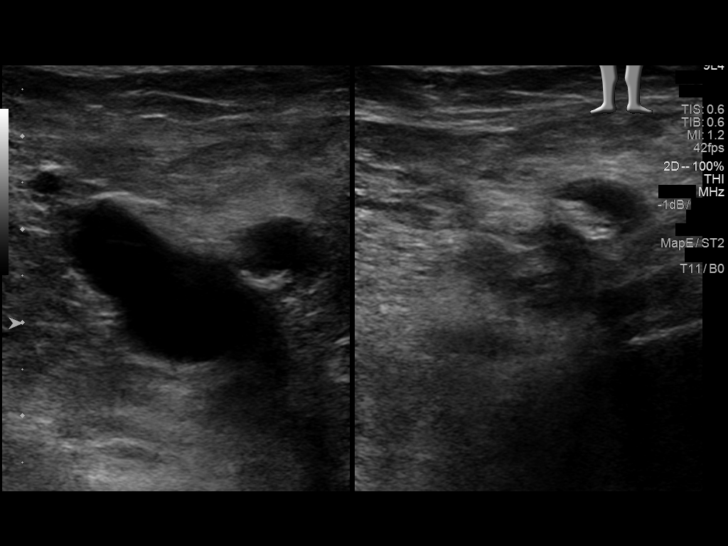
[im 10/33]
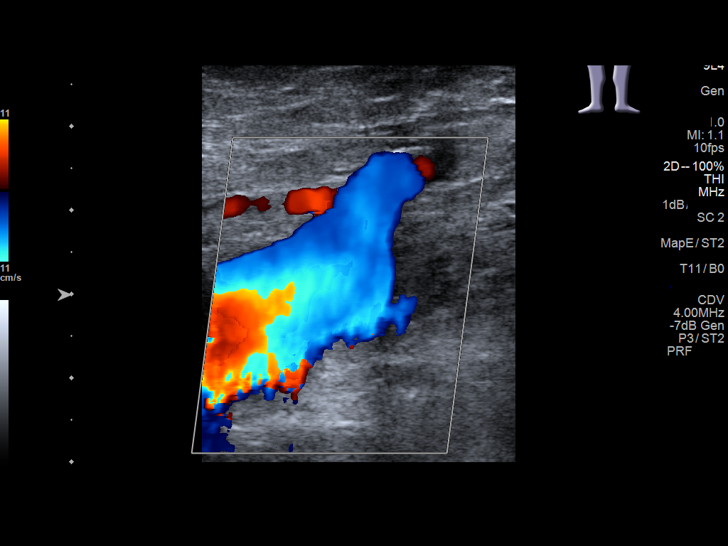
[im 13/33]
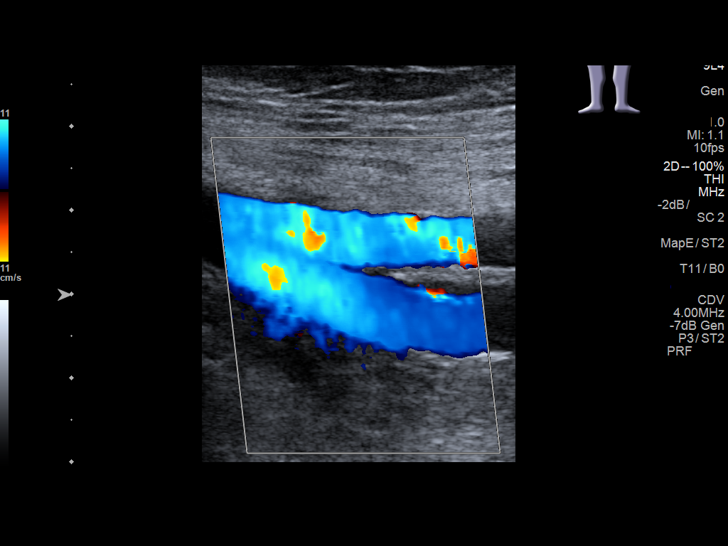
[im 16/33]
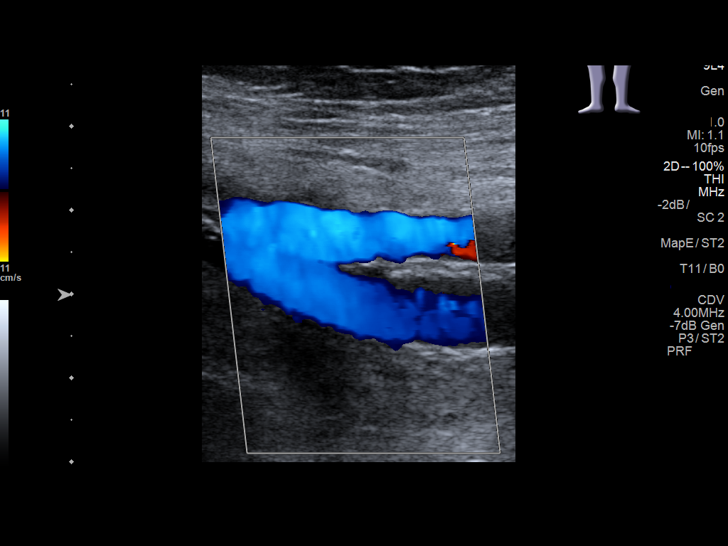
[im 17/33]
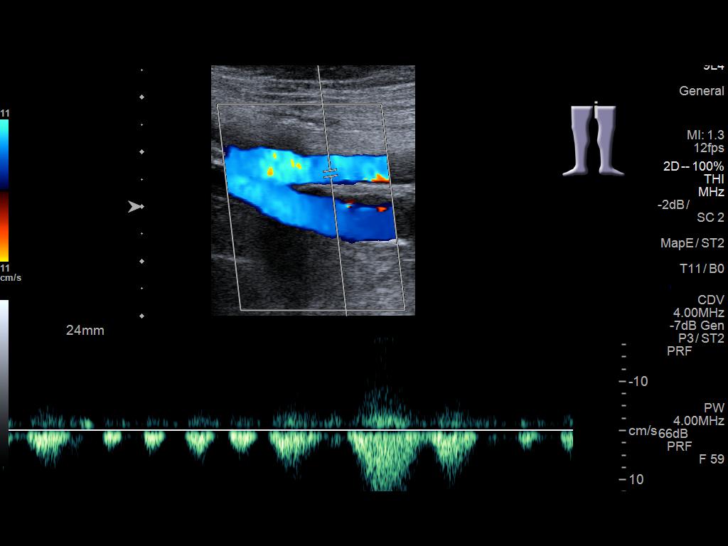
[im 20/33]
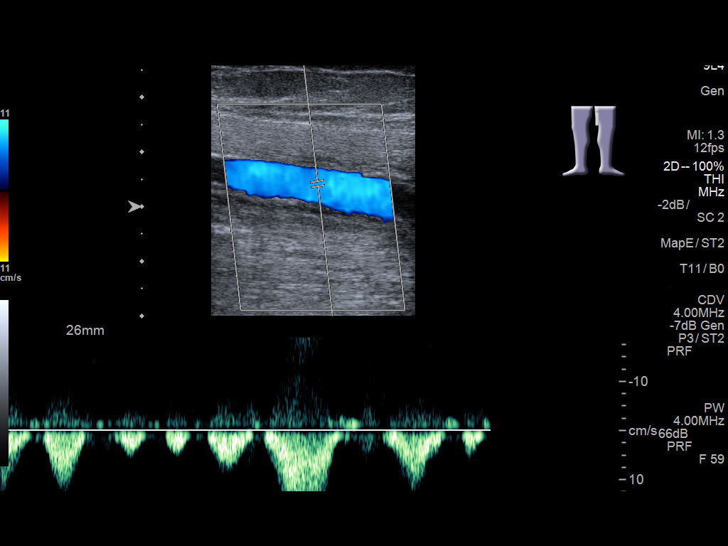
[im 23/33]
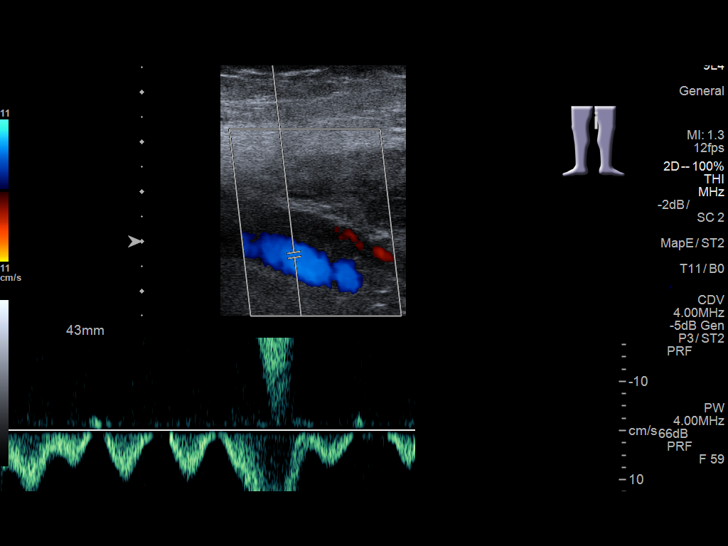
[im 26/33]
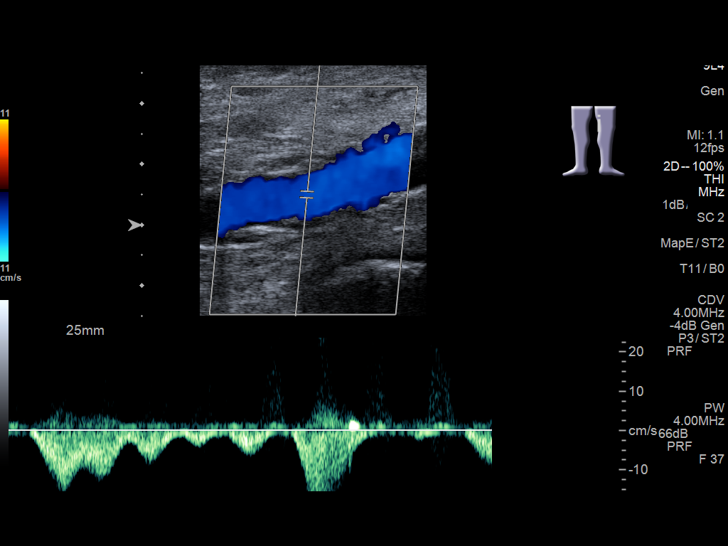
[im 27/33]
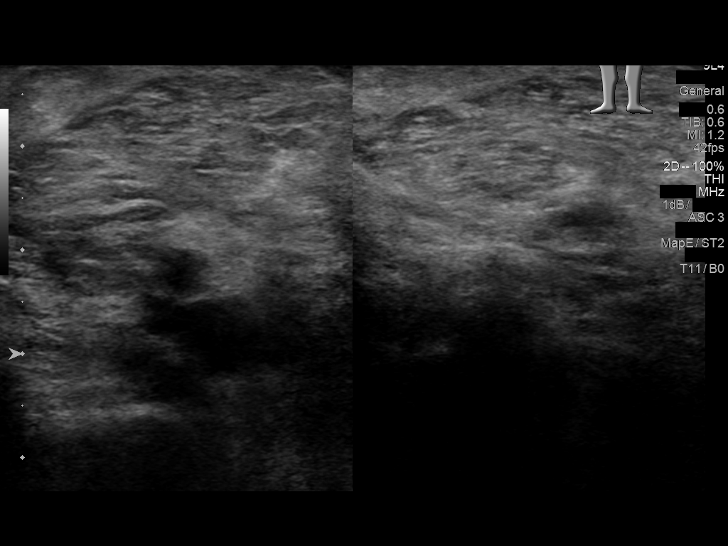
[im 30/33]
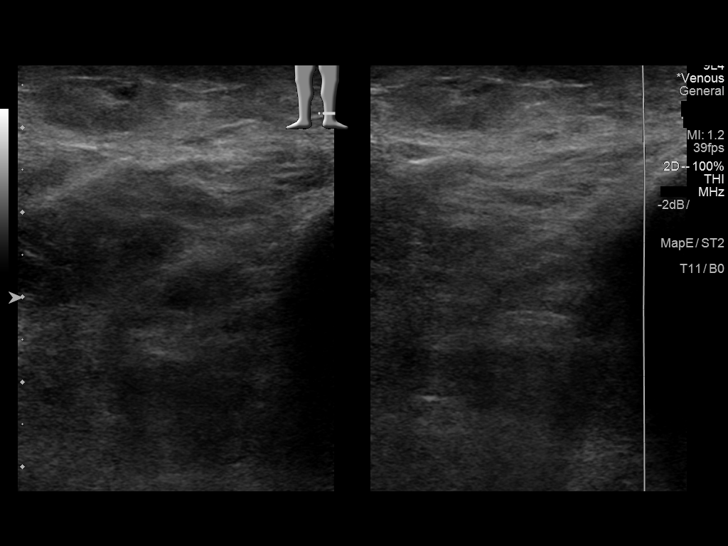
[im 33/33]
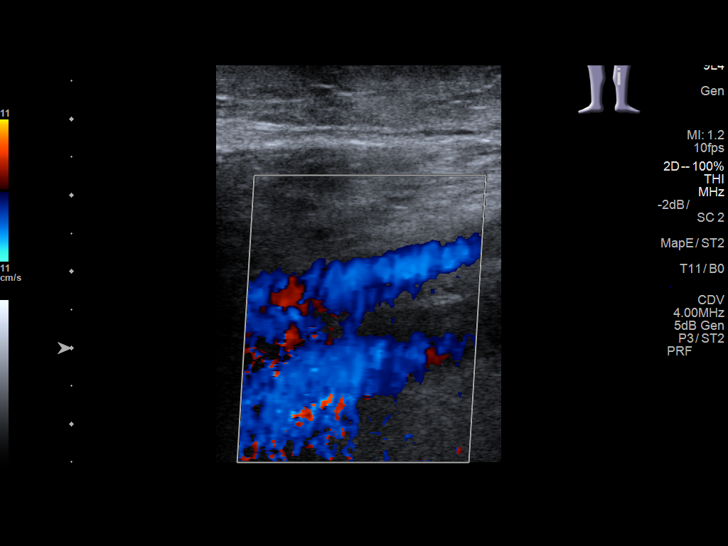

[14 of 24 positions shown; findings below may reference images not displayed]

FINDINGS: There is complete compressibility of the left common femoral,
femoral, and popliteal veins. Doppler analysis demonstrates
respiratory phasicity and augmentation of flow with calf
compression. No obvious superficial vein or calf vein thrombosis.
IMPRESSION: No evidence of left lower extremity DVT.

## 2017-11-07 IMAGING — CR DG CHEST 2V
2 series · 2 of 2 positions shown · non-contrast
Comparison: PA and lateral chest 07/20/2014 and 05/07/2014.

CLINICAL DATA: Atrial fibrillation with rapid ventricular response.
Initial encounter.

EXAM:
CHEST  2 VIEW

[chest pa]
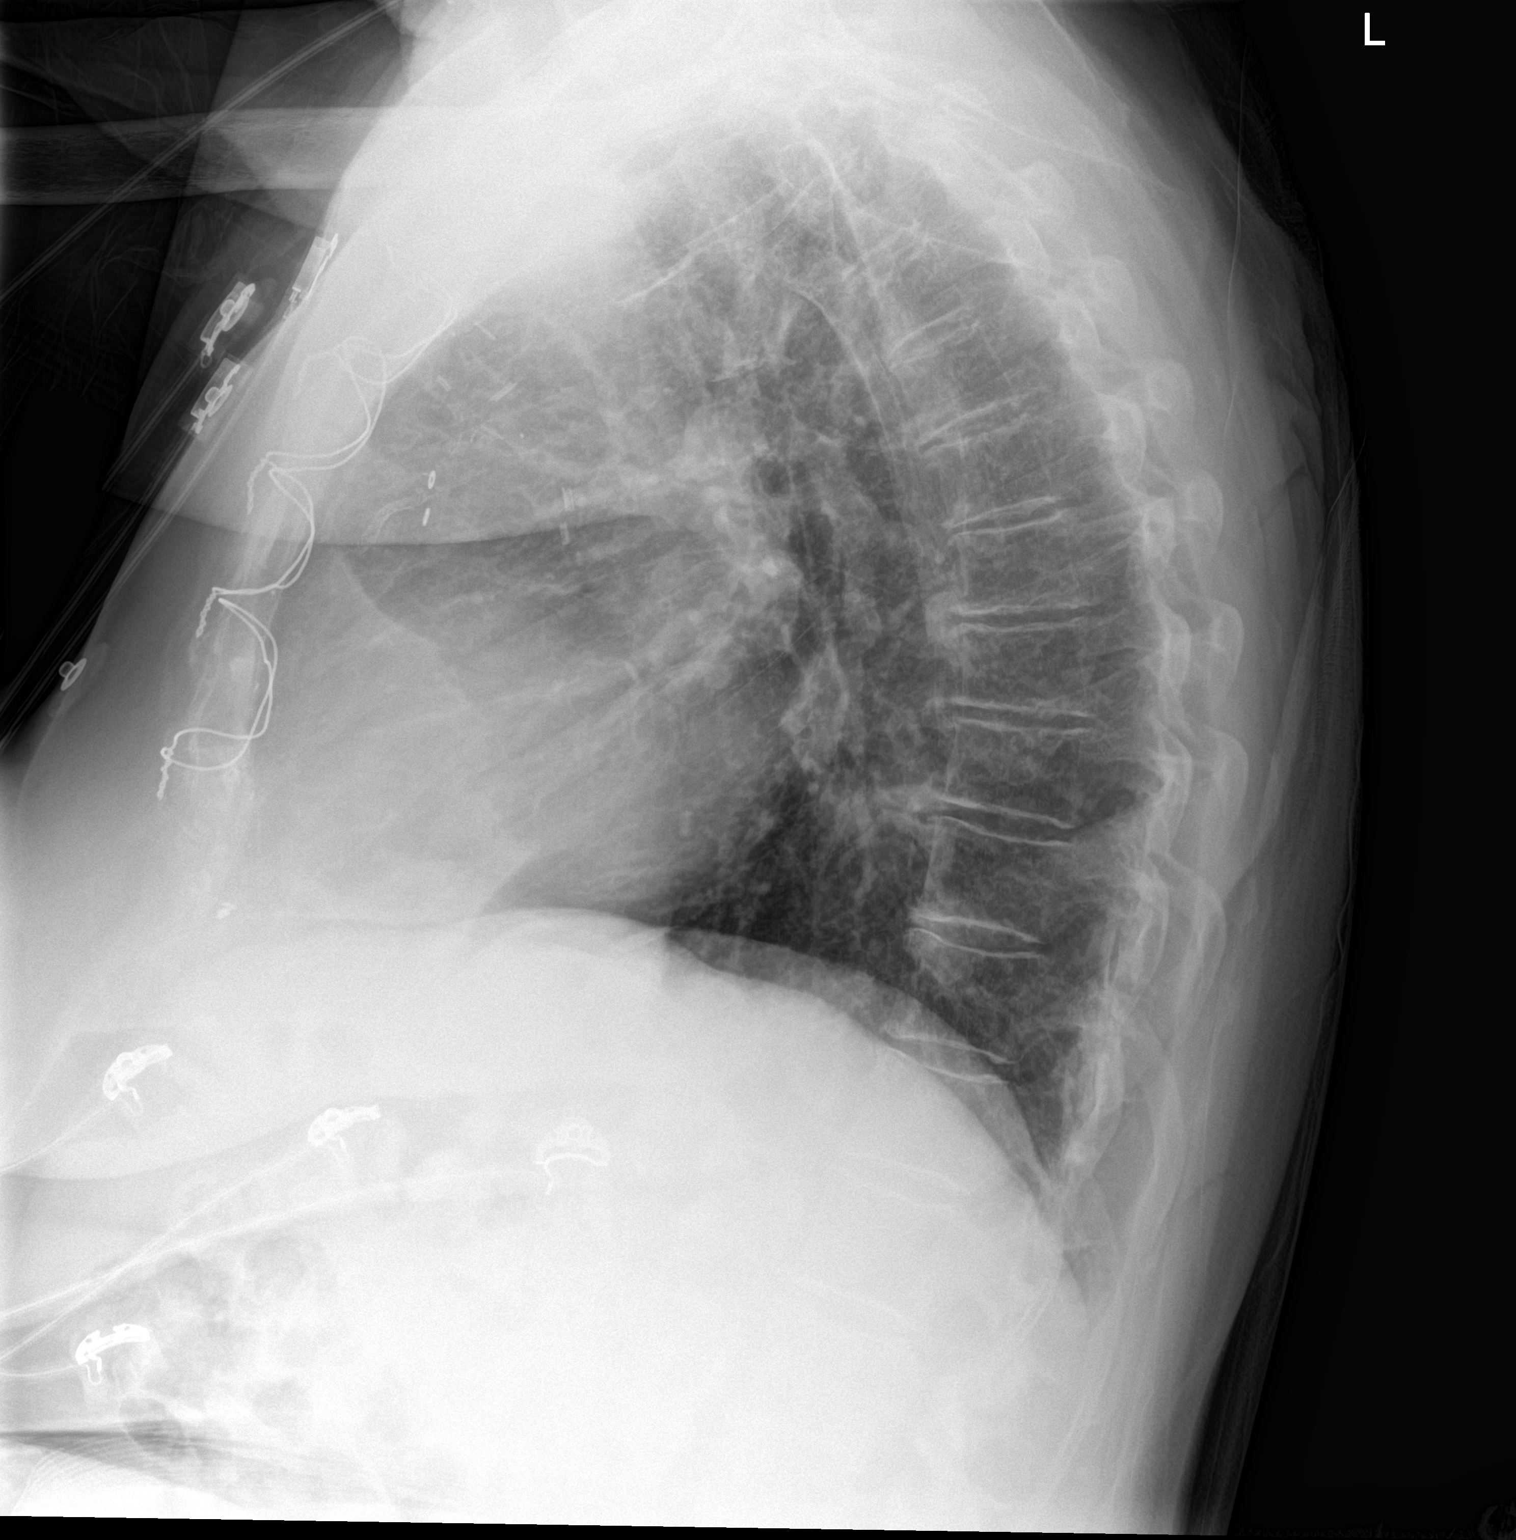

[chest ap]
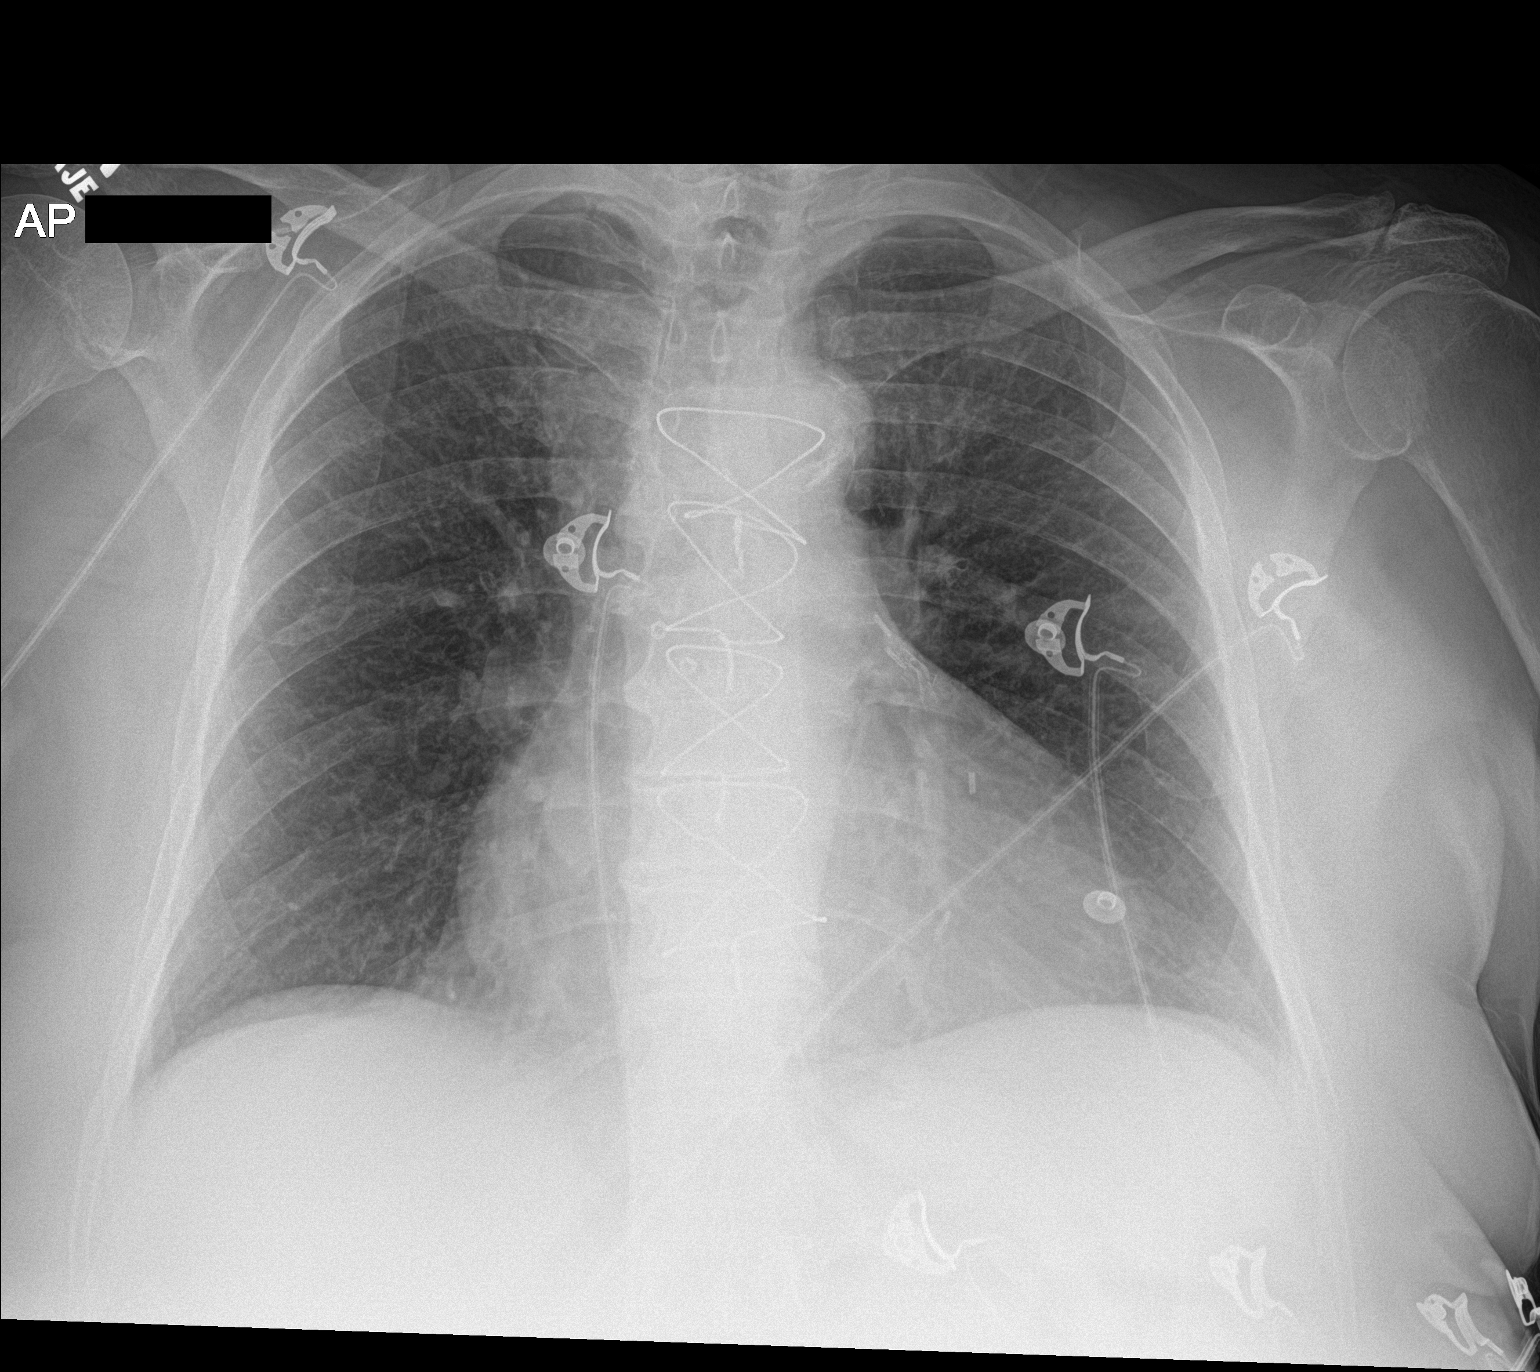

[2 of 2 positions shown; findings below may reference images not displayed]

FINDINGS: The patient is status post CABG. There is cardiomegaly without
edema. Lungs are clear. No pneumothorax or pleural effusion.
IMPRESSION: Cardiomegaly without acute disease.

## 2017-11-25 IMAGING — DX DG CHEST 1V PORT
1 series · 1 of 1 positions shown · non-contrast
Comparison: Chest radiograph performed 08/26/2015

CLINICAL DATA: Acute onset of sternal chest pain. Initial
encounter.

EXAM:
PORTABLE CHEST 1 VIEW

[chest ap]
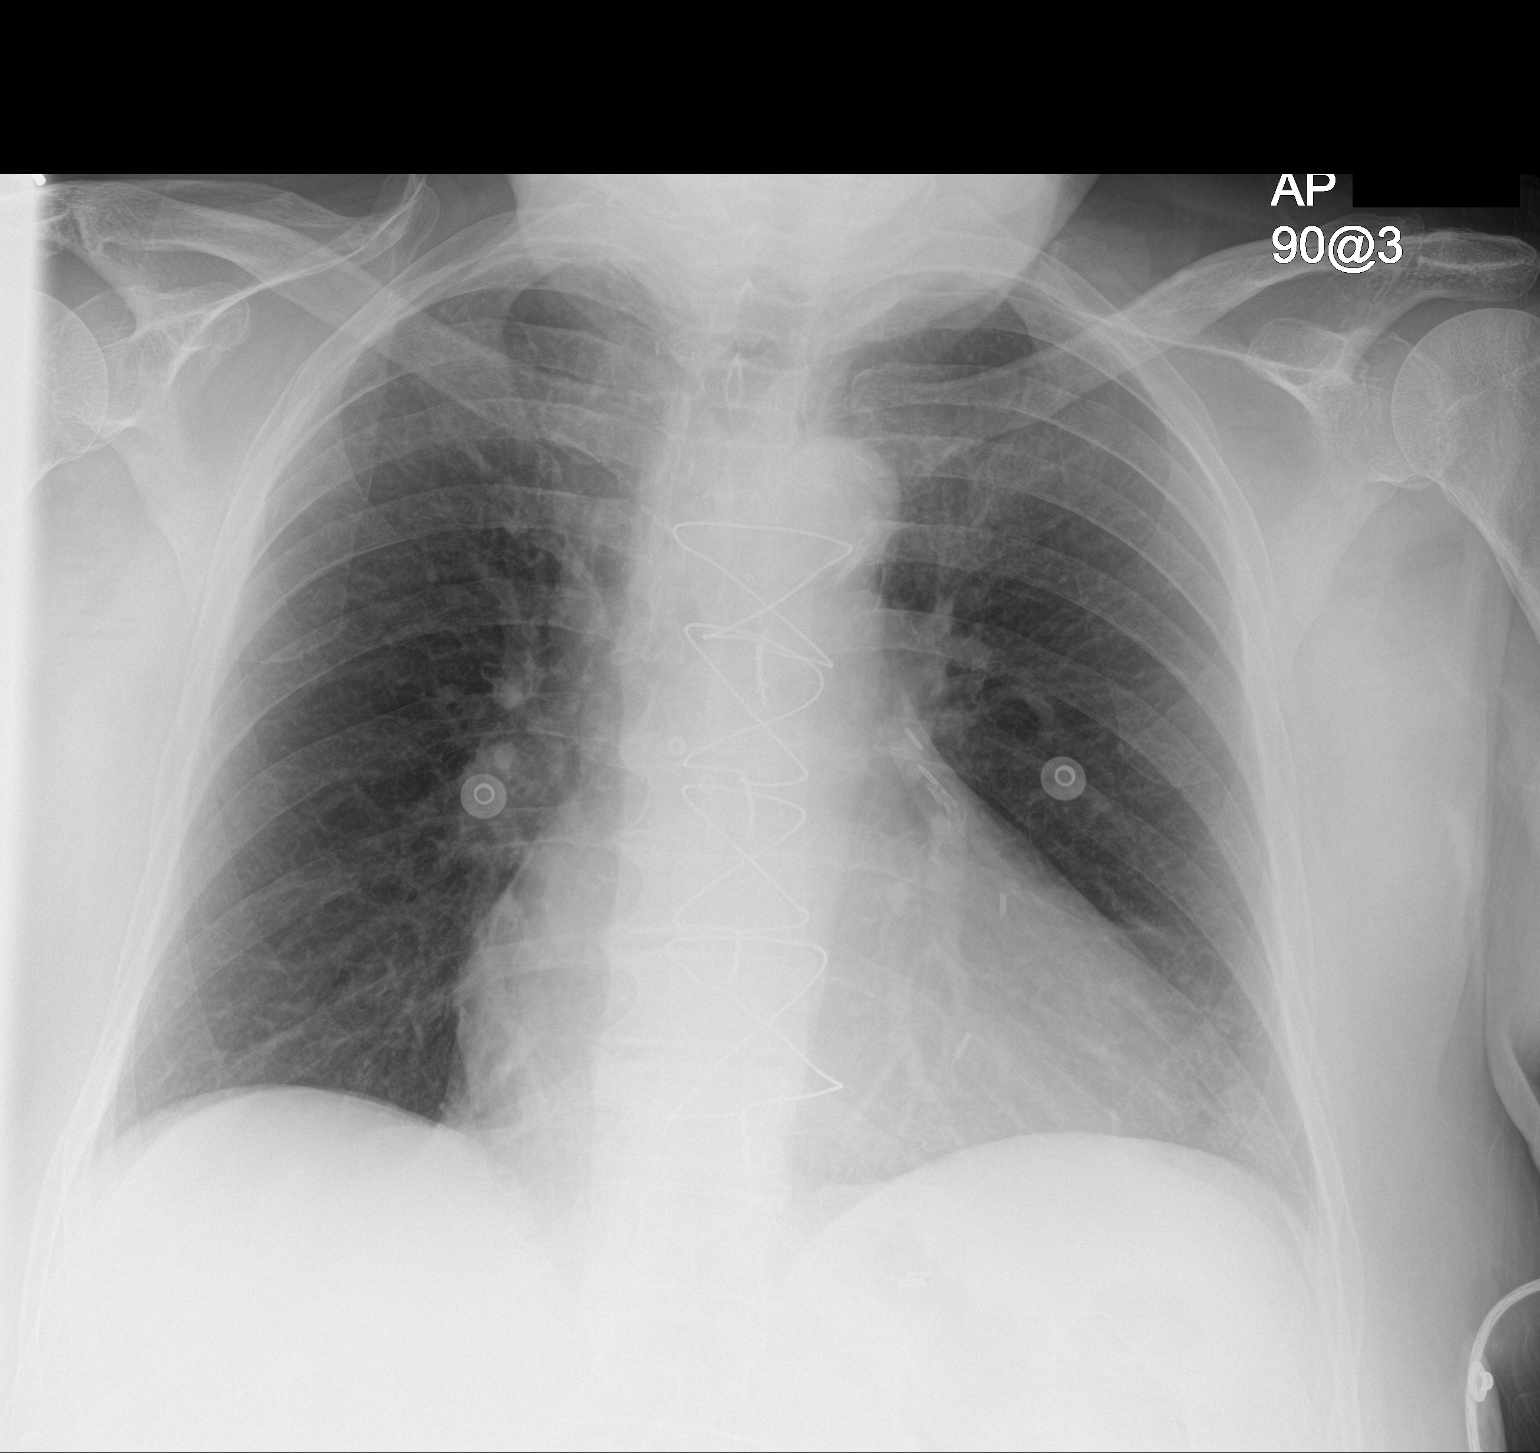

[1 of 1 positions shown; findings below may reference images not displayed]

FINDINGS: The lungs are well-aerated. Pulmonary vascularity is at the upper
limits of normal. Minimal left basilar atelectasis is noted. There
is no evidence of pleural effusion or pneumothorax.

The cardiomediastinal silhouette is mildly enlarged. The patient is
status post median sternotomy, with evidence of prior CABG. No acute
osseous abnormalities are seen.
IMPRESSION: Mild cardiomegaly noted. Minimal left basilar atelectasis noted.
Lungs otherwise grossly clear.

## 2017-12-16 IMAGING — US US EXTREM LOW VENOUS BILAT
1 series · 12 of 24 positions shown · non-contrast
Comparison: Left lower extremity venous duplex study February 07, 2016

CLINICAL DATA: Lower extremity pain and edema for 3 days

EXAM:
BILATERAL LOWER EXTREMITY VENOUS DUPLEX ULTRASOUND
TECHNIQUE: Gray-scale sonography with graded compression, as well as color
Doppler and duplex ultrasound were performed to evaluate the lower
extremity deep venous systems from the level of the common femoral
vein and including the common femoral, femoral, profunda femoral,
popliteal and calf veins including the posterior tibial, peroneal
and gastrocnemius veins when visible. The superficial great
saphenous vein was also interrogated. Spectral Doppler was utilized
to evaluate flow at rest and with distal augmentation maneuvers in
the common femoral, femoral and popliteal veins.

[Series 1: us extrem low venous bilat · 12 of 73 slices shown]
[im 4/73]
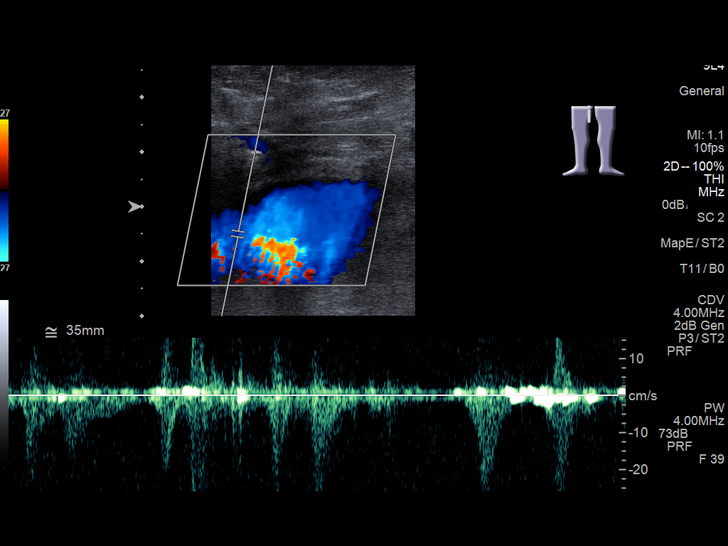
[im 10/73]
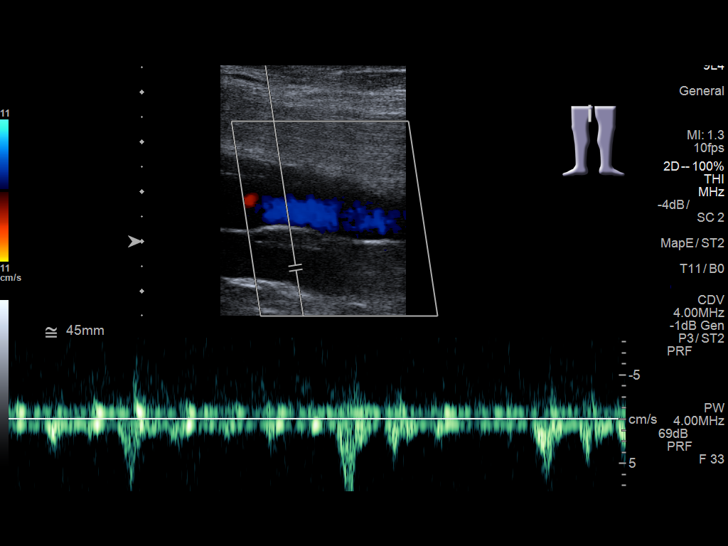
[im 16/73]
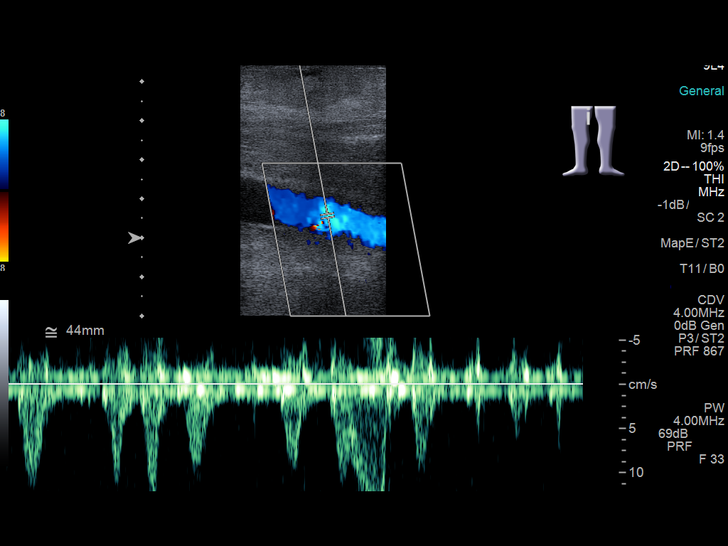
[im 22/73]
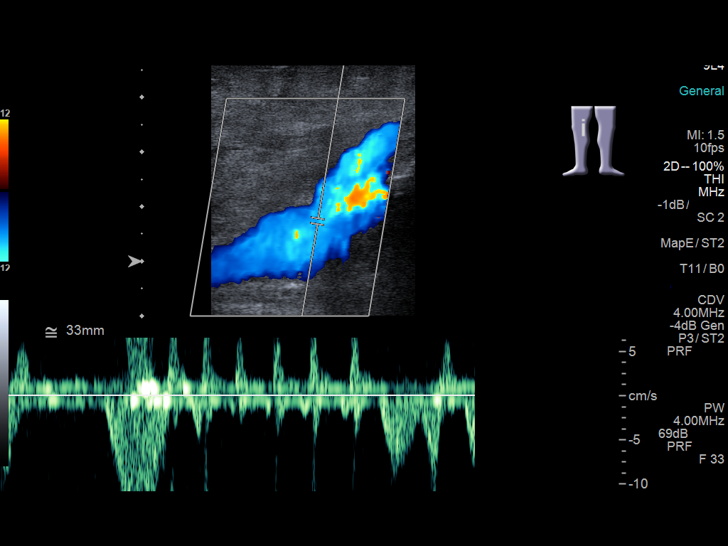
[im 29/73]
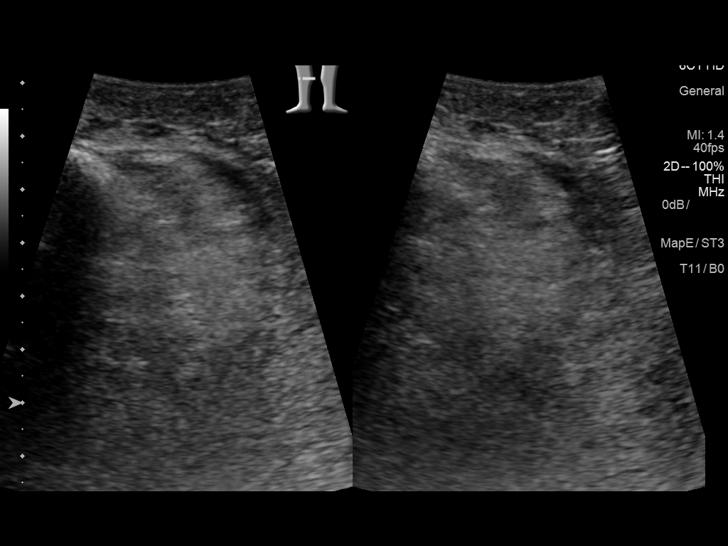
[im 35/73]
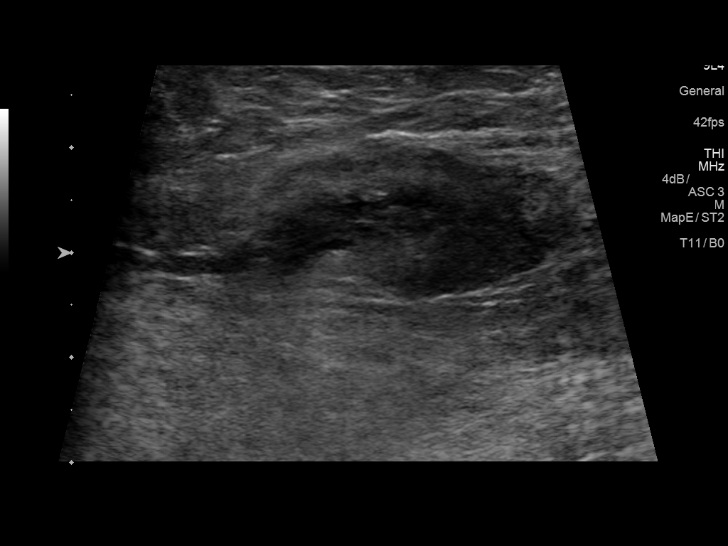
[im 41/73]
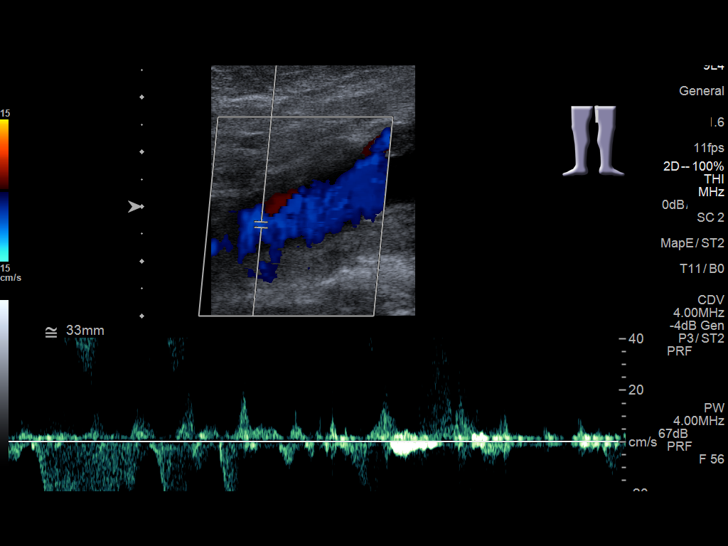
[im 47/73]
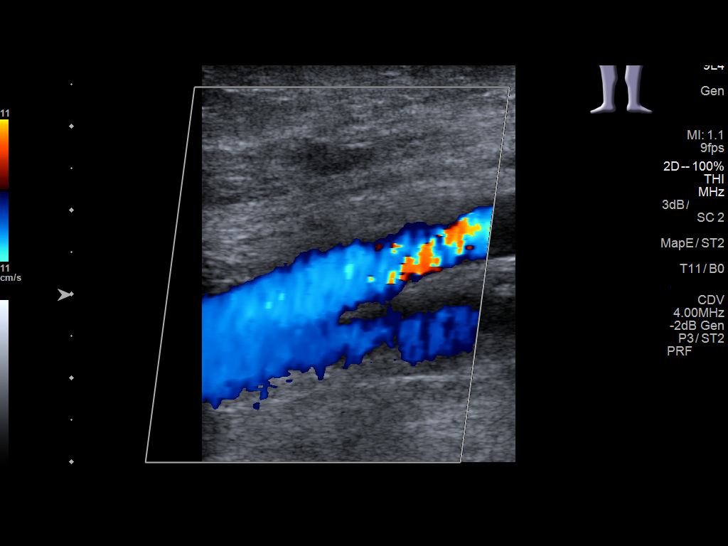
[im 54/73]
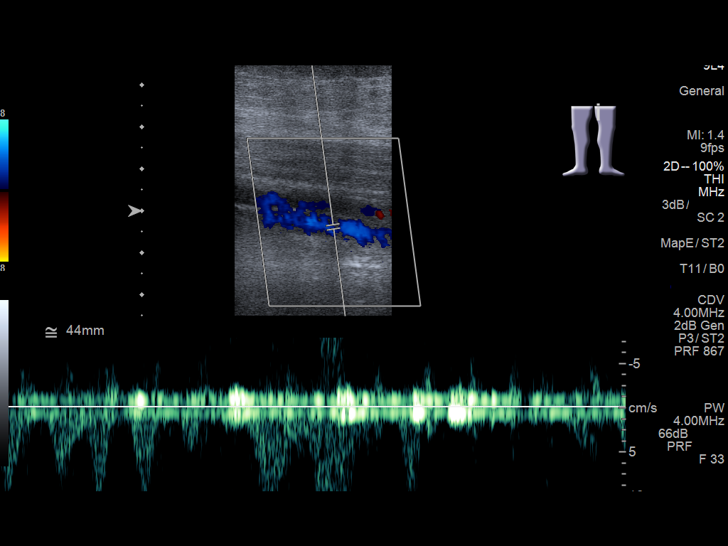
[im 60/73]
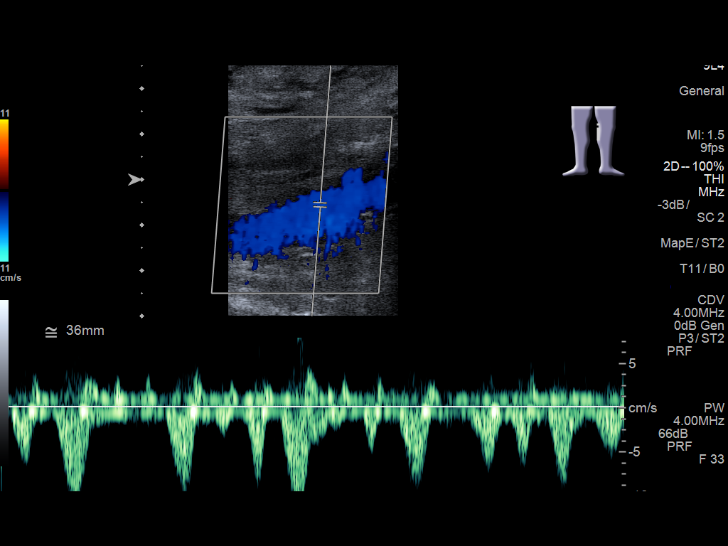
[im 66/73]
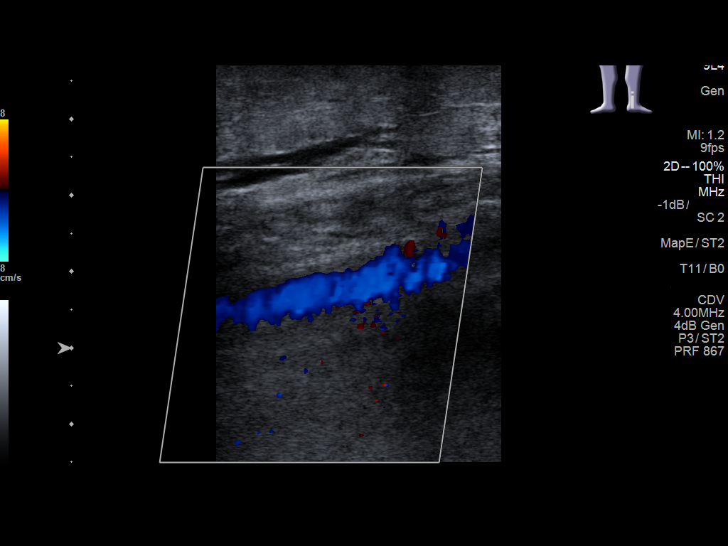
[im 73/73]
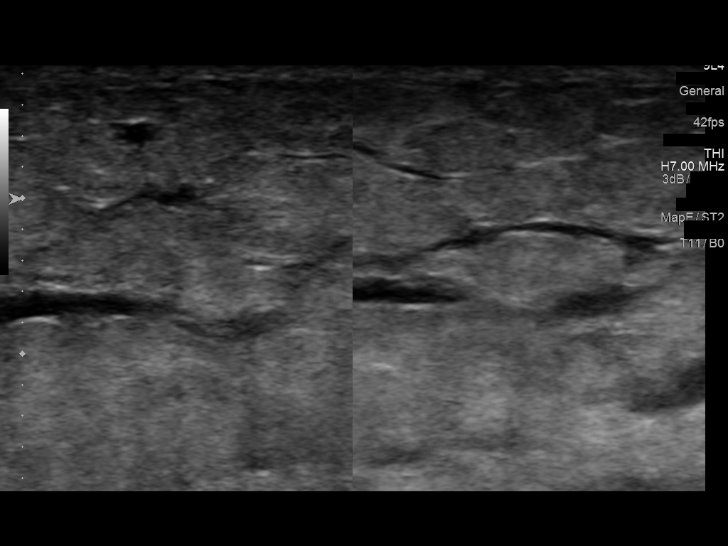

[12 of 24 positions shown; findings below may reference images not displayed]

FINDINGS: RIGHT LOWER EXTREMITY

Common Femoral Vein: No evidence of thrombus. Normal
compressibility, respiratory phasicity and response to augmentation.

Saphenofemoral Junction: No evidence of thrombus. Normal
compressibility and flow on color Doppler imaging.

Profunda Femoral Vein: No evidence of thrombus. Normal
compressibility and flow on color Doppler imaging.

Femoral Vein: No evidence of thrombus. Normal compressibility,
respiratory phasicity and response to augmentation.

Popliteal Vein: No evidence of thrombus. Normal compressibility,
respiratory phasicity and response to augmentation.

Calf Veins: No evidence of thrombus. Normal compressibility and flow
on color Doppler imaging.

Superficial Great Saphenous Vein: No evidence of thrombus. Normal
compressibility and flow on color Doppler imaging.

Venous Reflux:  None.

Other Findings: There is a prominent lymph node in the right
inguinal region with central fatty hilum measuring 3.4 x 1.5 x
cm. There is edema in the right lower extremity.

LEFT LOWER EXTREMITY

Common Femoral Vein: No evidence of thrombus. Normal
compressibility, respiratory phasicity and response to augmentation.

Saphenofemoral Junction: There is incompletely obstructing thrombus
at the saphenous femoral junction which is not extending into the
common femoral vein currently. There is diminished Doppler signal in
this area with diminished compression and augmentation.

Profunda Femoral Vein: No evidence of thrombus. Normal
compressibility and flow on color Doppler imaging.

Femoral Vein: No evidence of thrombus. Normal compressibility,
respiratory phasicity and response to augmentation.

Popliteal Vein: No evidence of thrombus. Normal compressibility,
respiratory phasicity and response to augmentation.

Calf Veins: No evidence of thrombus. Normal compressibility and flow
on color Doppler imaging.

Superficial Great Saphenous Vein: There is thrombus in the greater
saphenous vein with diminished Doppler signal and diminished
compression and augmentation.

Venous Reflux:  None.

Other Findings:  There is edema in the left lower extremity.
IMPRESSION: No evidence of deep venous thrombosis in either lower extremity. No
superficial thrombus seen on the right. In the left lower extremity,
however, nonocclusive appearing venous thrombosis is seen in the
greater saphenous vein, a superficial structure, with extension to
the saphenous femoral junction on the left. While no thrombus is
seen currently in the deep system on the left, there is high risk of
propagation of thrombus from the saphenous femoral junction on the
left into the left common femoral vein and possibly beyond. Given
this circumstance, a repeat study in 2-3 days of the left side is
felt to be warranted to assess for potential thrombus propagation.

Prominent but benign-appearing lymph node right inguinal region may
well have reactive etiology.

There is bilateral lower extremity soft tissue edema.

## 2017-12-21 IMAGING — US US ABDOMEN LIMITED
1 series · 14 of 25 positions shown · non-contrast
Comparison: Prior CT from 06/05/2016.

CLINICAL DATA: Initial evaluation for diffuse abdominal pain for 5
days.

EXAM:
US ABDOMEN LIMITED - RIGHT UPPER QUADRANT

[Series 1: us abdomen limited · 0.19mm/px · 14 of 74 slices shown]
[im 1/74]
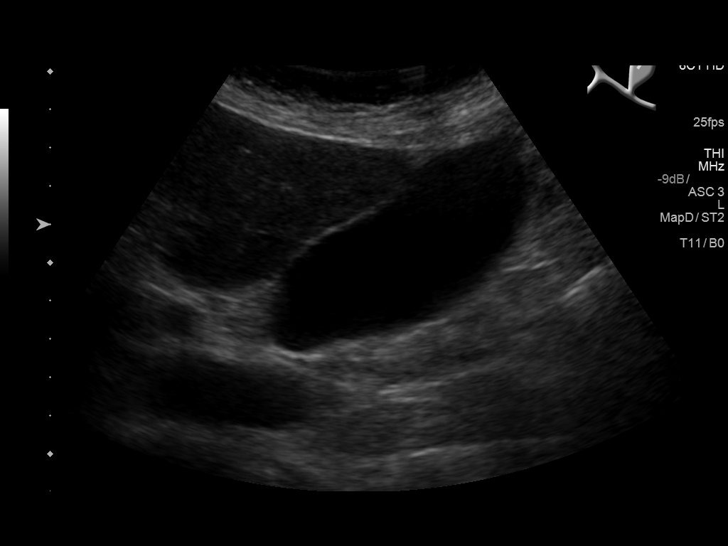
[im 7/74]
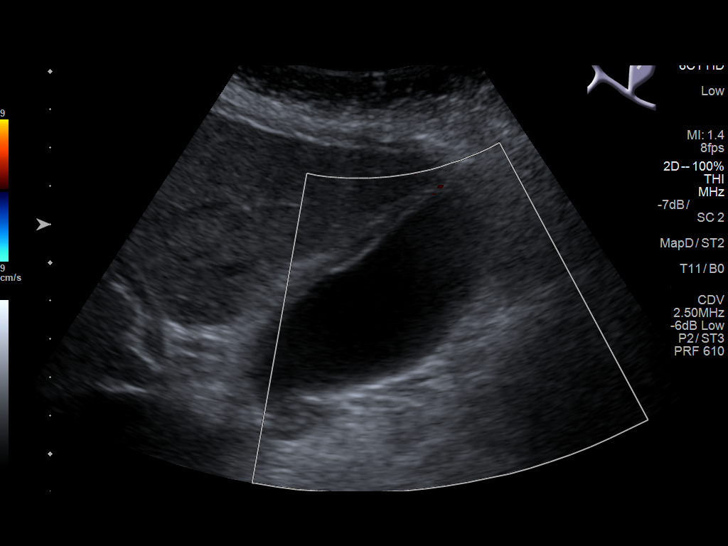
[im 13/74]
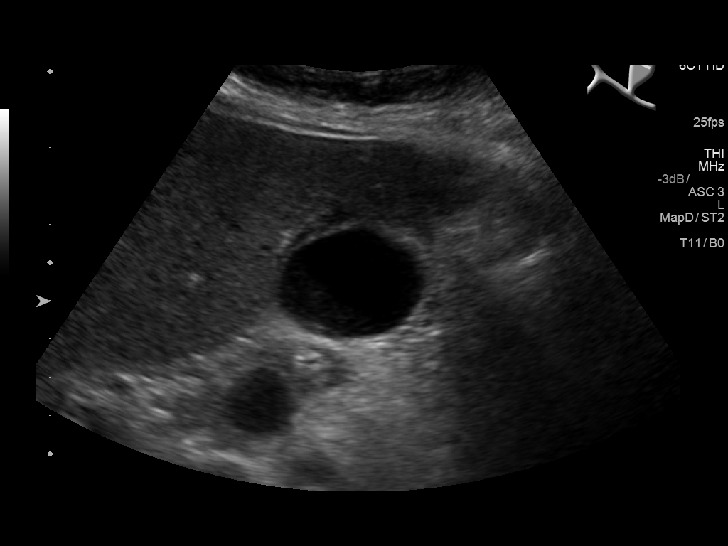
[im 19/74]
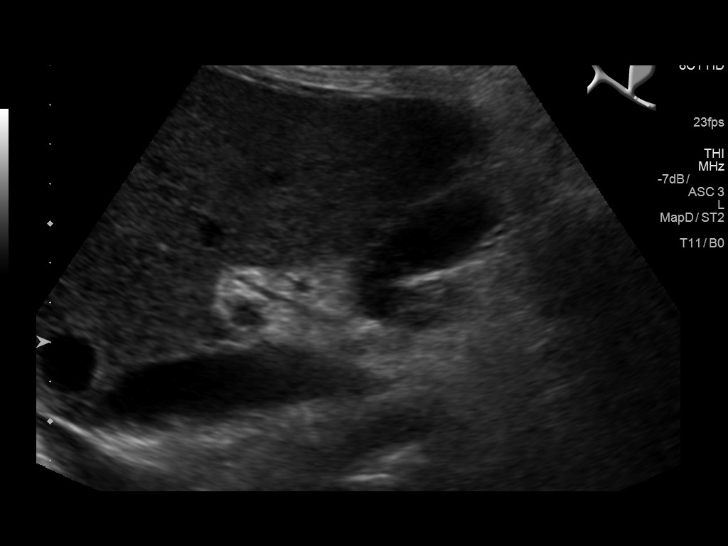
[im 25/74]
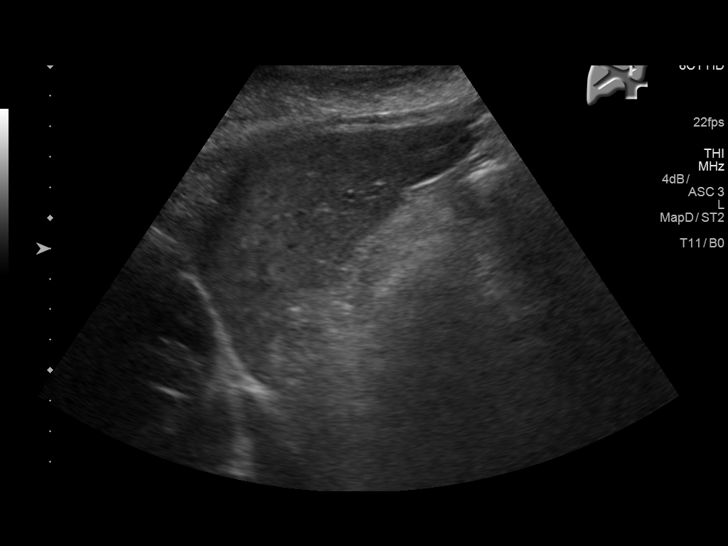
[im 28/74]
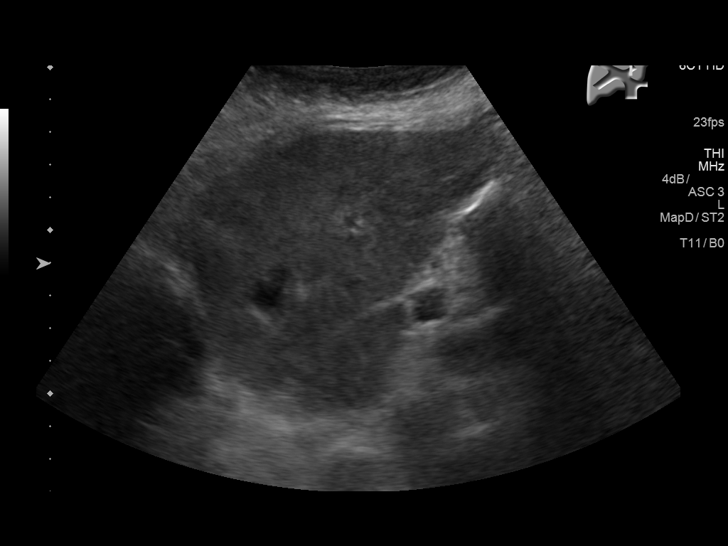
[im 34/74]
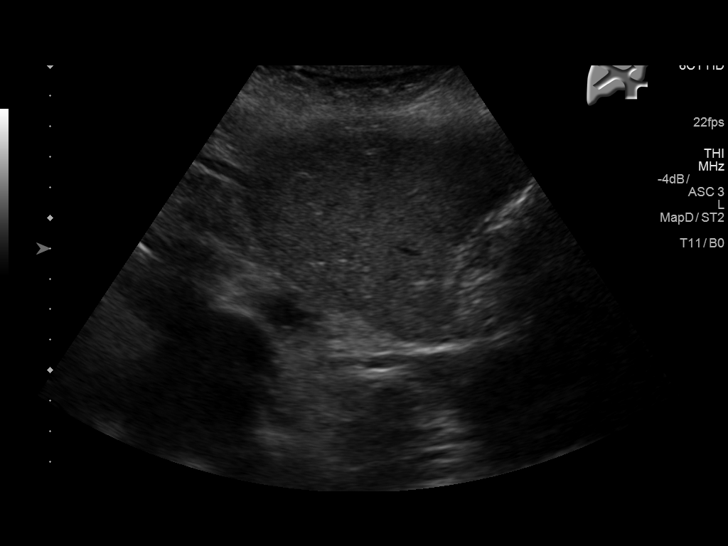
[im 40/74]
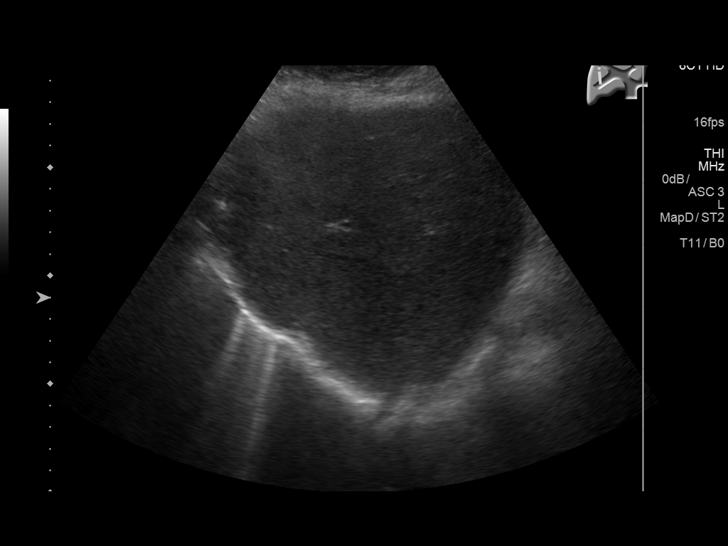
[im 46/74]
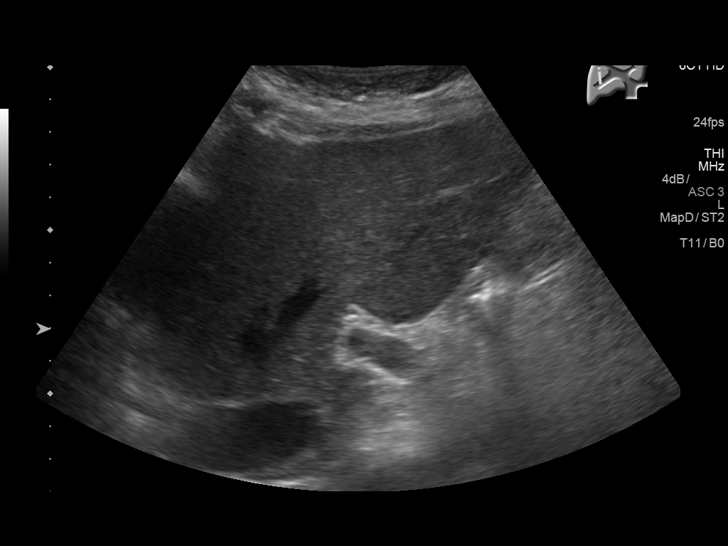
[im 49/74]
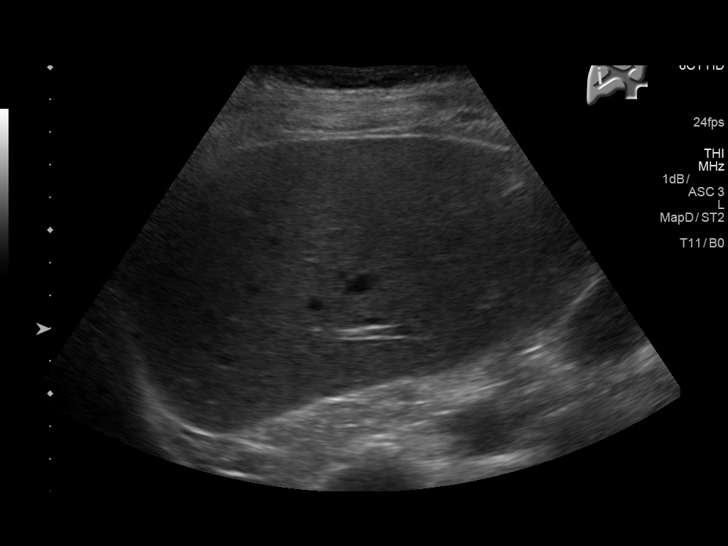
[im 55/74]
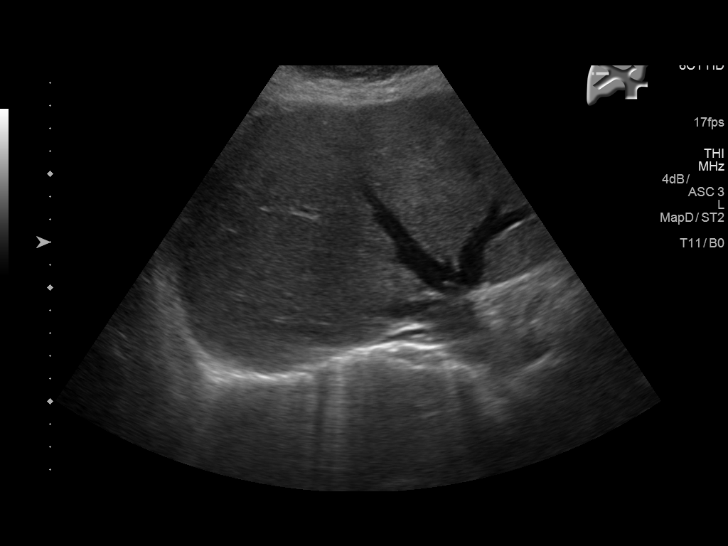
[im 61/74]
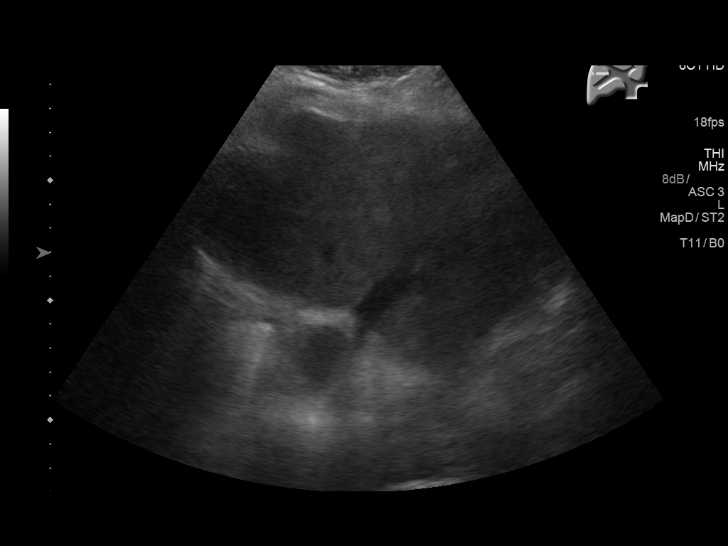
[im 67/74]
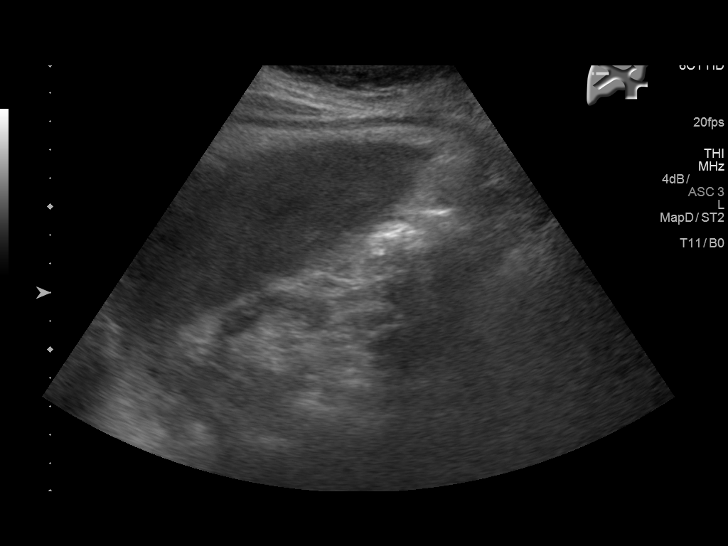
[im 74/74]
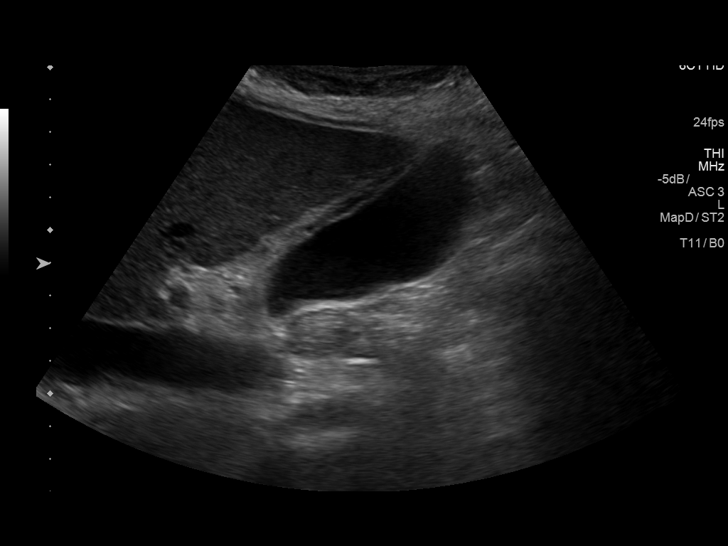

[14 of 25 positions shown; findings below may reference images not displayed]

FINDINGS: Gallbladder:

No stones or sludge present within the gallbladder lumen. Small
amount of free pericholecystic fluid present. Possible mild
gallbladder wall thickening up to 6 mm. These changes may be related
overall volume status. No sonographic Murphy sign elicited on exam.

Common bile duct:

Diameter: 3 mm

Liver:

No focal lesion identified. Within normal limits in parenchymal
echogenicity.

Incidental note made of an atrophic right kidney.
IMPRESSION: 1. Small amount of pericholecystic free fluid with mild gallbladder
wall thickening. These changes may be related to overall volume
status. No cholelithiasis or other sonographic features to suggest
acute cholecystitis. Correlation with symptomatology and laboratory
values recommended.
2. No biliary dilatation.
3. Atrophic right kidney.

## 2017-12-26 IMAGING — US US EXTREM LOW VENOUS BILAT
1 series · 13 of 24 positions shown · non-contrast
Comparison: Prior ultrasound from 06/05/2016.

CLINICAL DATA: Initial evaluation for lower extremity edema,
evaluate for DVT.



[Series 1: us extrem low venous bilat · 0.07mm/px · 13 of 65 slices shown]
[im 1/65]
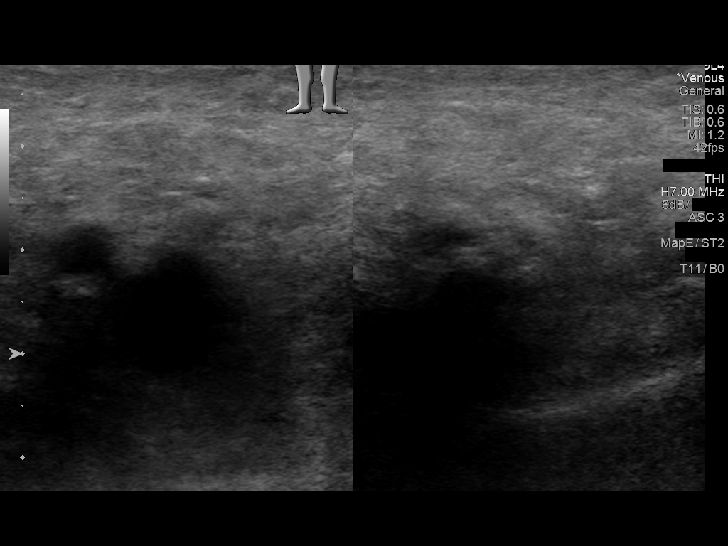
[im 6/65]
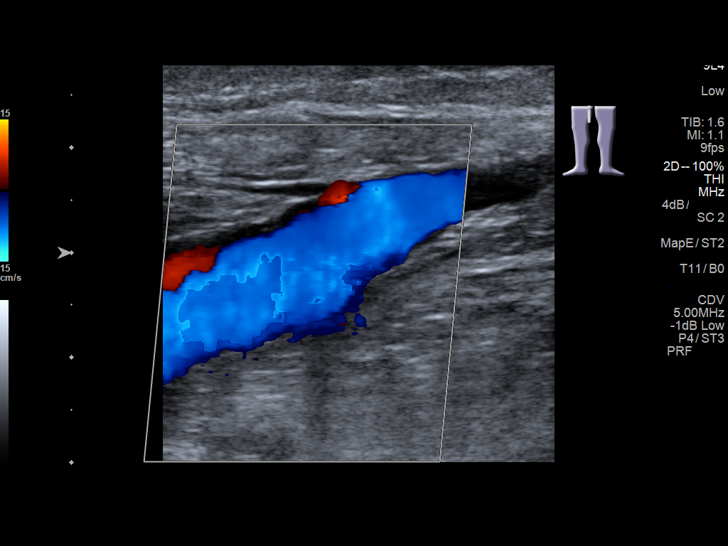
[im 12/65]
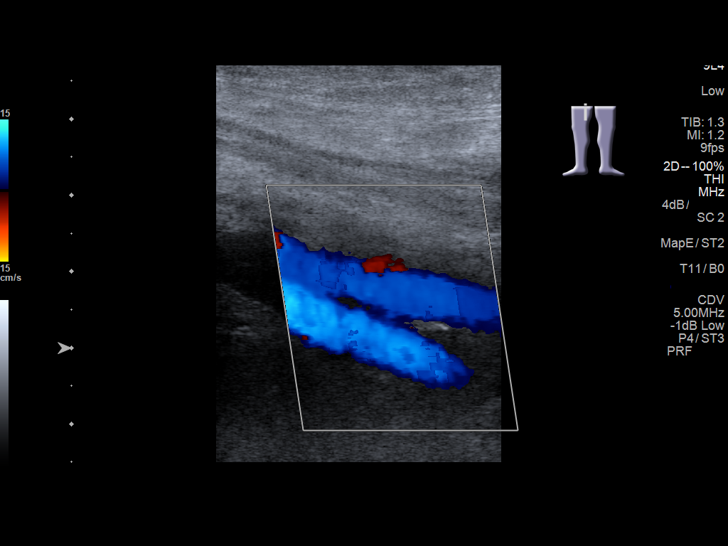
[im 17/65]
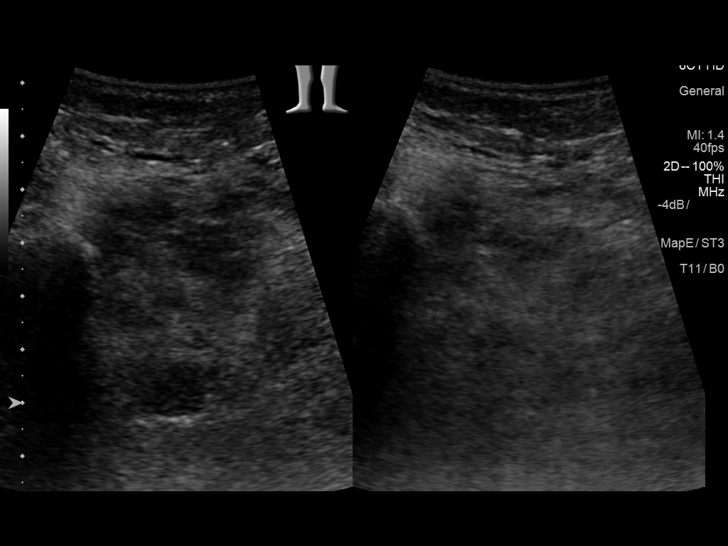
[im 23/65]
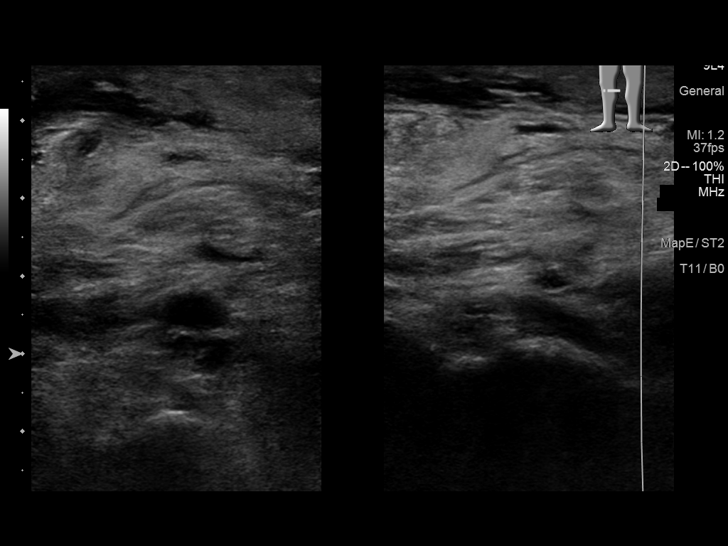
[im 28/65]
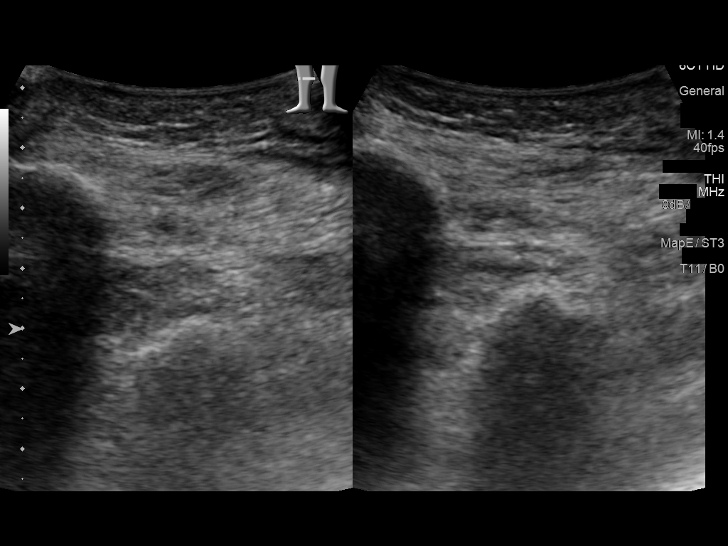
[im 34/65]
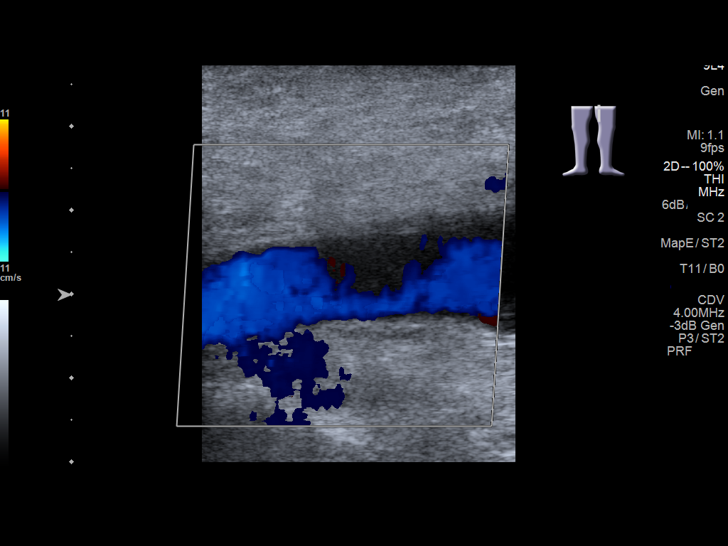
[im 37/65]
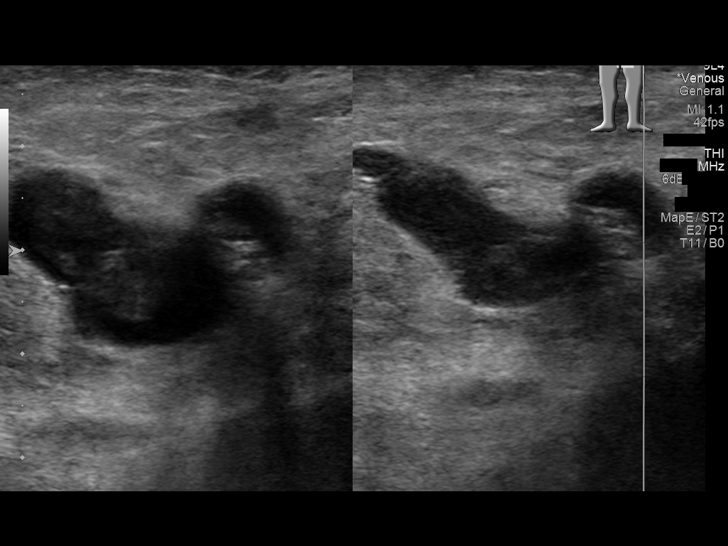
[im 42/65]
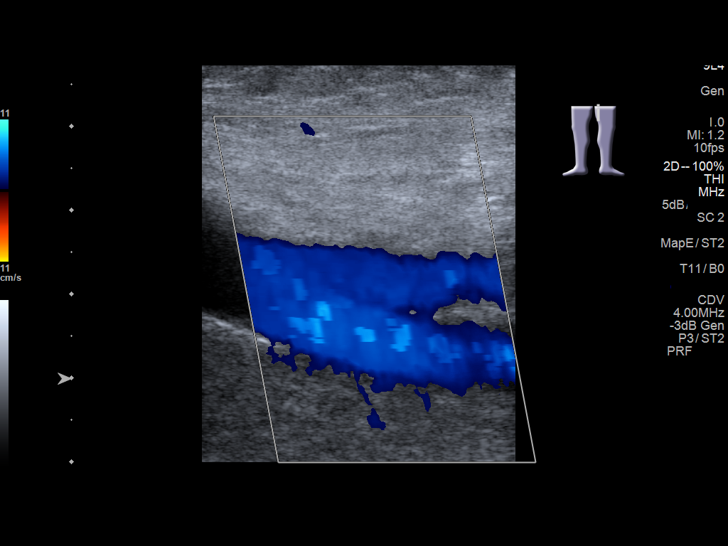
[im 48/65]
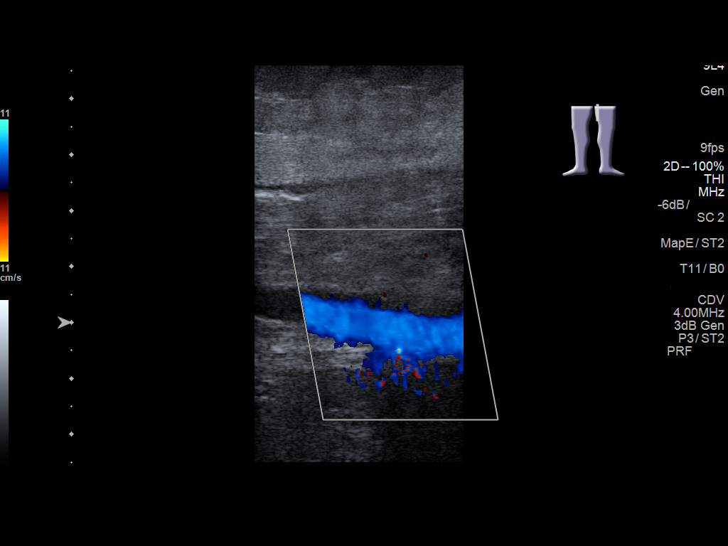
[im 53/65]
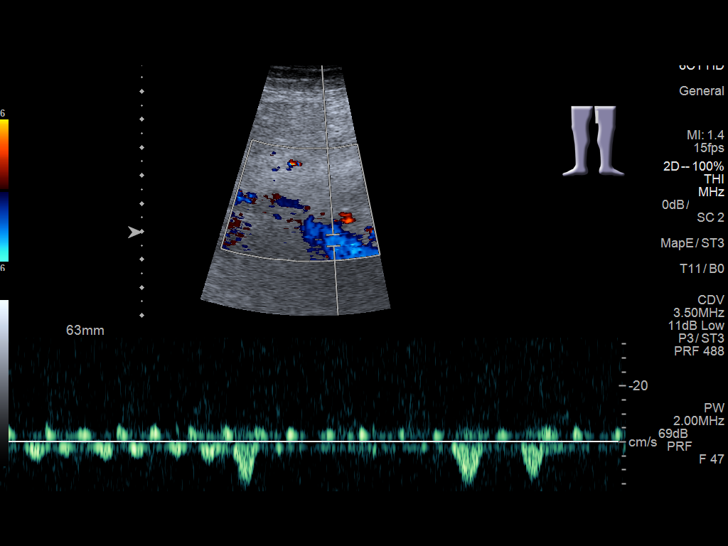
[im 59/65]
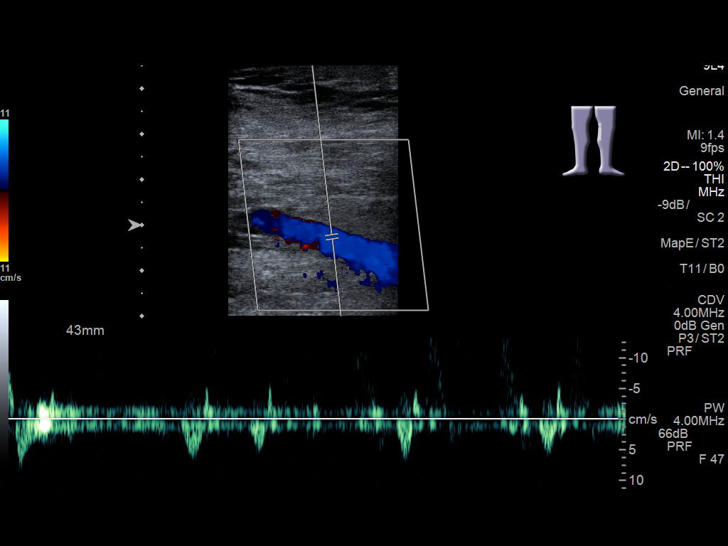
[im 65/65]
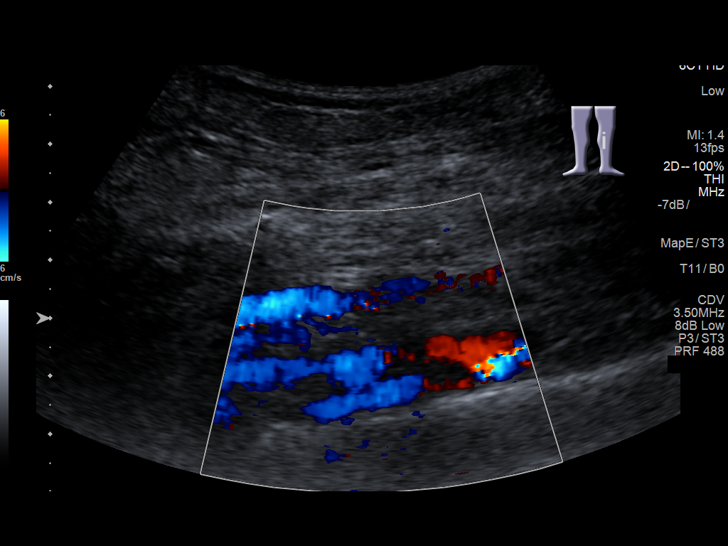

[13 of 24 positions shown; findings below may reference images not displayed]

FINDINGS: RIGHT LOWER EXTREMITY

Common Femoral Vein: No evidence of thrombus. Normal
compressibility, respiratory phasicity and response to augmentation.

Saphenofemoral Junction: No evidence of thrombus. Normal
compressibility and flow on color Doppler imaging.

Profunda Femoral Vein: No evidence of thrombus. Normal
compressibility and flow on color Doppler imaging.

Femoral Vein: No evidence of thrombus. Normal compressibility,
respiratory phasicity and response to augmentation.

Popliteal Vein: No evidence of thrombus. Normal compressibility,
respiratory phasicity and response to augmentation.

Calf Veins: No evidence of thrombus. Normal compressibility and flow
on color Doppler imaging.

Superficial Great Saphenous Vein: No evidence of thrombus. Normal
compressibility and flow on color Doppler imaging.

Venous Reflux:  None.

Other Findings:  None.

LEFT LOWER EXTREMITY

Common Femoral Vein: Nonocclusive thrombus now evident within the
left common femoral vein. Loss of normal compressibility.

Saphenofemoral Junction: Nonocclusive thrombus present at the
saphenous femoral junction. Loss of normal compressibility. This is
similar to previous.

Profunda Femoral Vein: No evidence of thrombus. Normal
compressibility and flow on color Doppler imaging.

Femoral Vein: No evidence of thrombus. Normal compressibility,
respiratory phasicity and response to augmentation.

Popliteal Vein: No evidence of thrombus. Normal compressibility,
respiratory phasicity and response to augmentation.

Calf Veins: No evidence of thrombus. Normal compressibility and flow
on color Doppler imaging.

Superficial Great Saphenous Vein: Nonocclusive thrombus again noted
within the great saphenous vein. Again, this is noncompressible.

Venous Reflux:  None.

Other Findings:  None.
IMPRESSION: 1. Persistent nonocclusive thrombus within the left saphenofemoral
junction and superficial great saphenous vein, with new nonocclusive
thrombus within the left common femoral vein.
2. No evidence for DVT within the right lower extremity.

## 2018-01-16 IMAGING — CR DG CHEST 2V
2 series · 2 of 2 positions shown · non-contrast
Comparison: 09/13/2015

CLINICAL DATA: Pt reports around 2 pm she was awakened from sleep
with tight, burning pain to her mid sternal chest . Pt reports
+nausea, +shortness of breath with the pain but denies diaphoresis.
Pt talking in full and complete sentences with ...*comment was
truncated*

EXAM:
CHEST  2 VIEW

[chest pa]
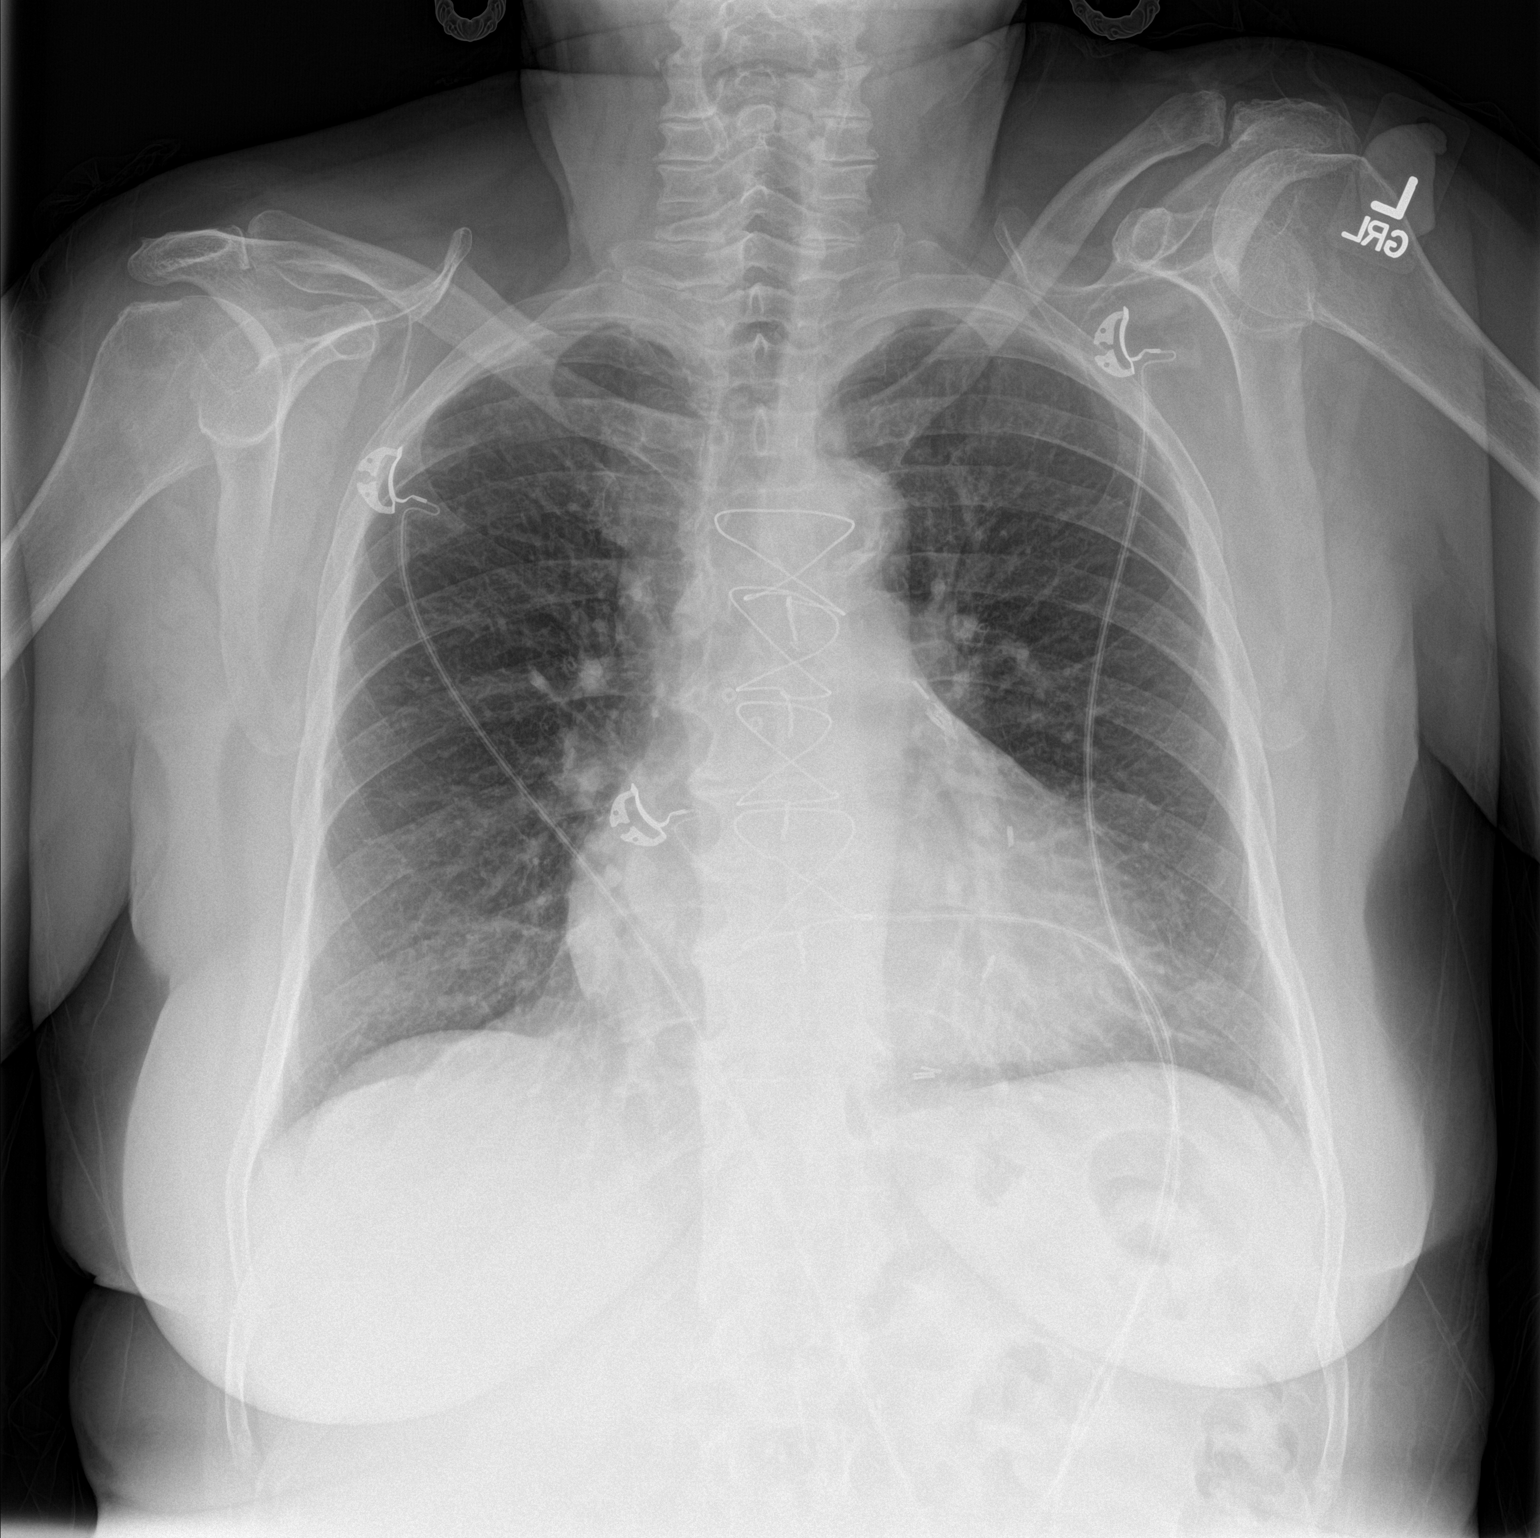

[chest lat]
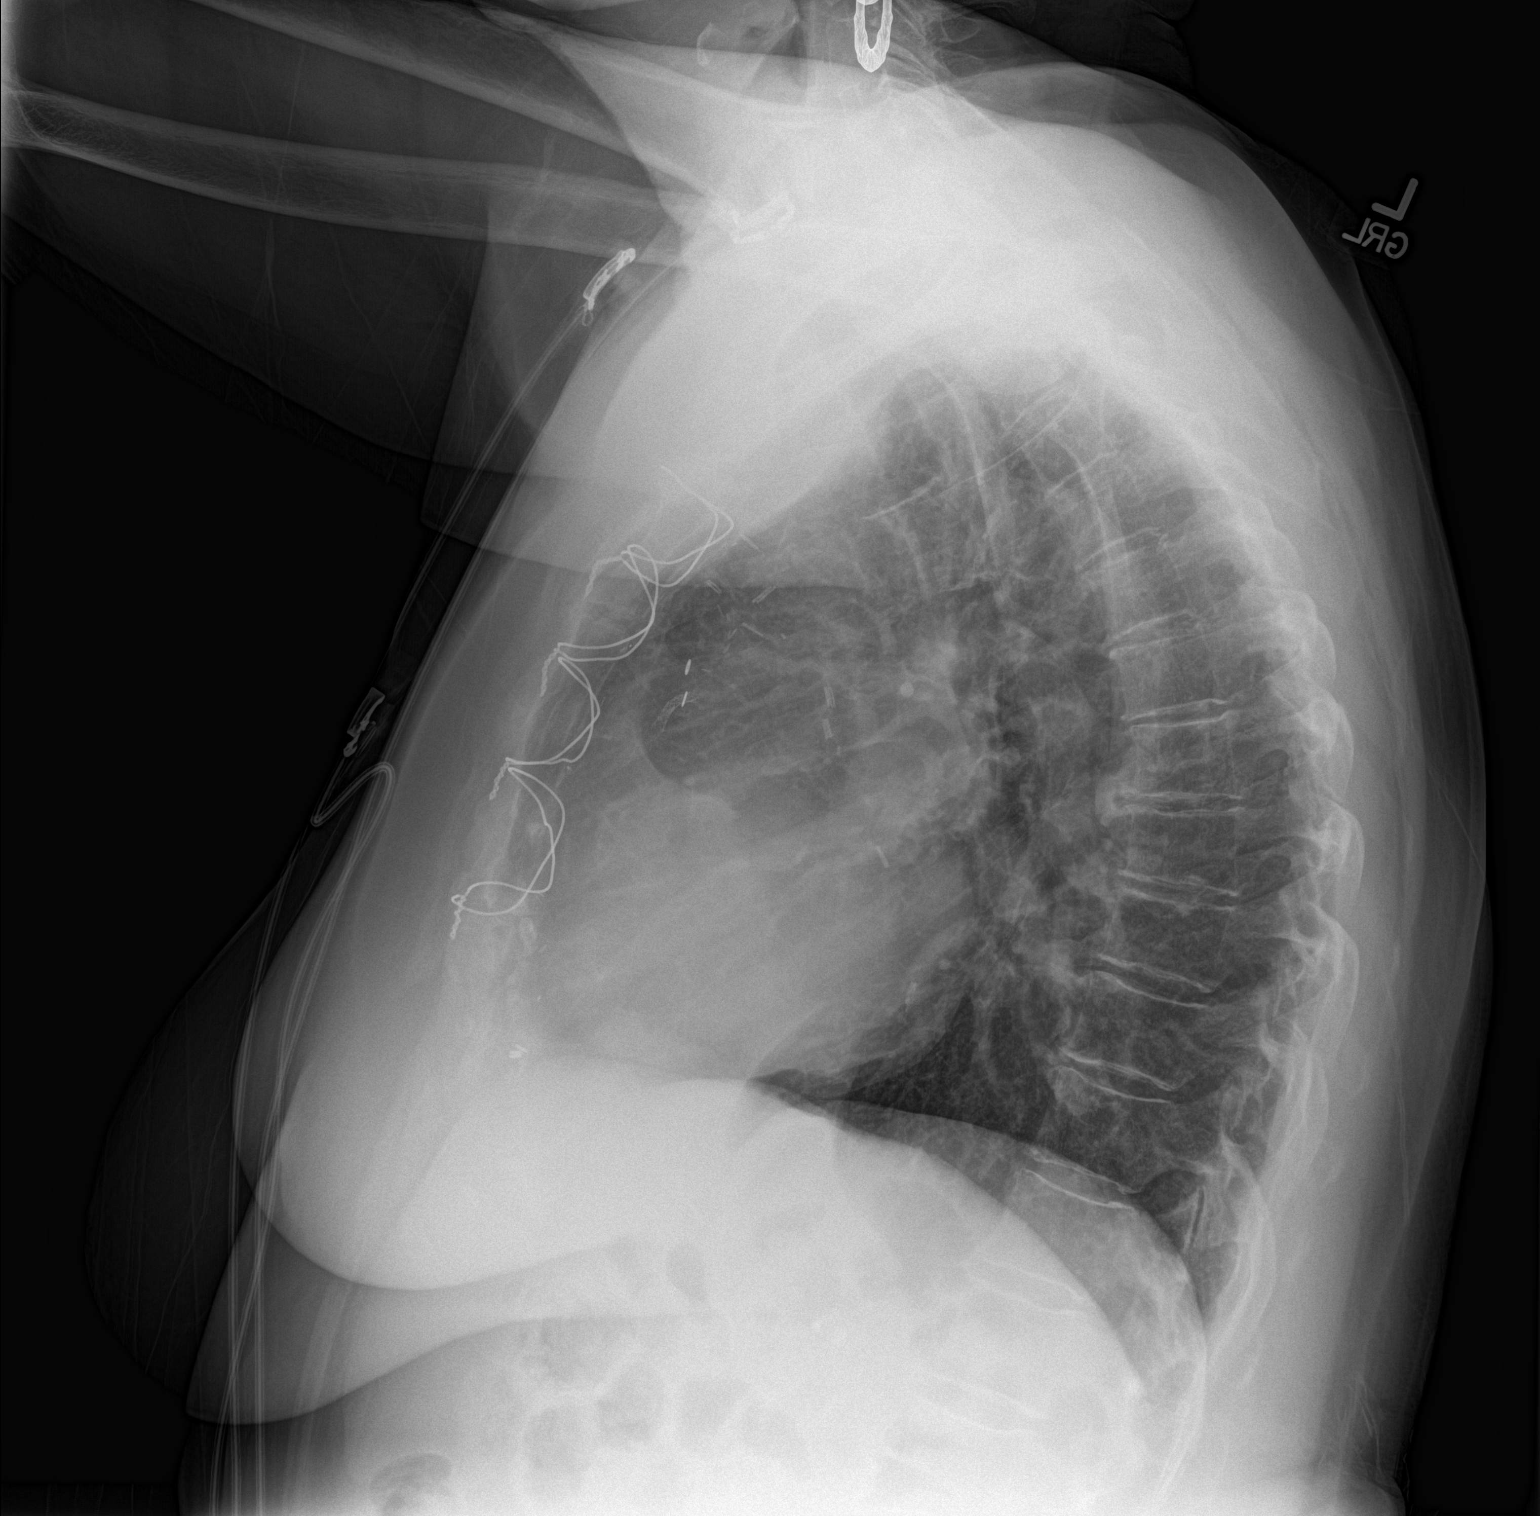

[2 of 2 positions shown; findings below may reference images not displayed]

FINDINGS: Sternotomy wires overlie normal cardiac silhouette. No effusion,
infiltrate pneumothorax. Lungs mildly hyperinflated. Degenerative
osteophytosis of the thoracic spine.
IMPRESSION: Hyperinflated lungs.  No acute findings.

## 2018-02-06 IMAGING — DX DG CHEST 1V PORT
1 series · 1 of 1 positions shown · non-contrast
Comparison: 11/04/2015

CLINICAL DATA: Chest pain

EXAM:
PORTABLE CHEST 1 VIEW

[chest ap]
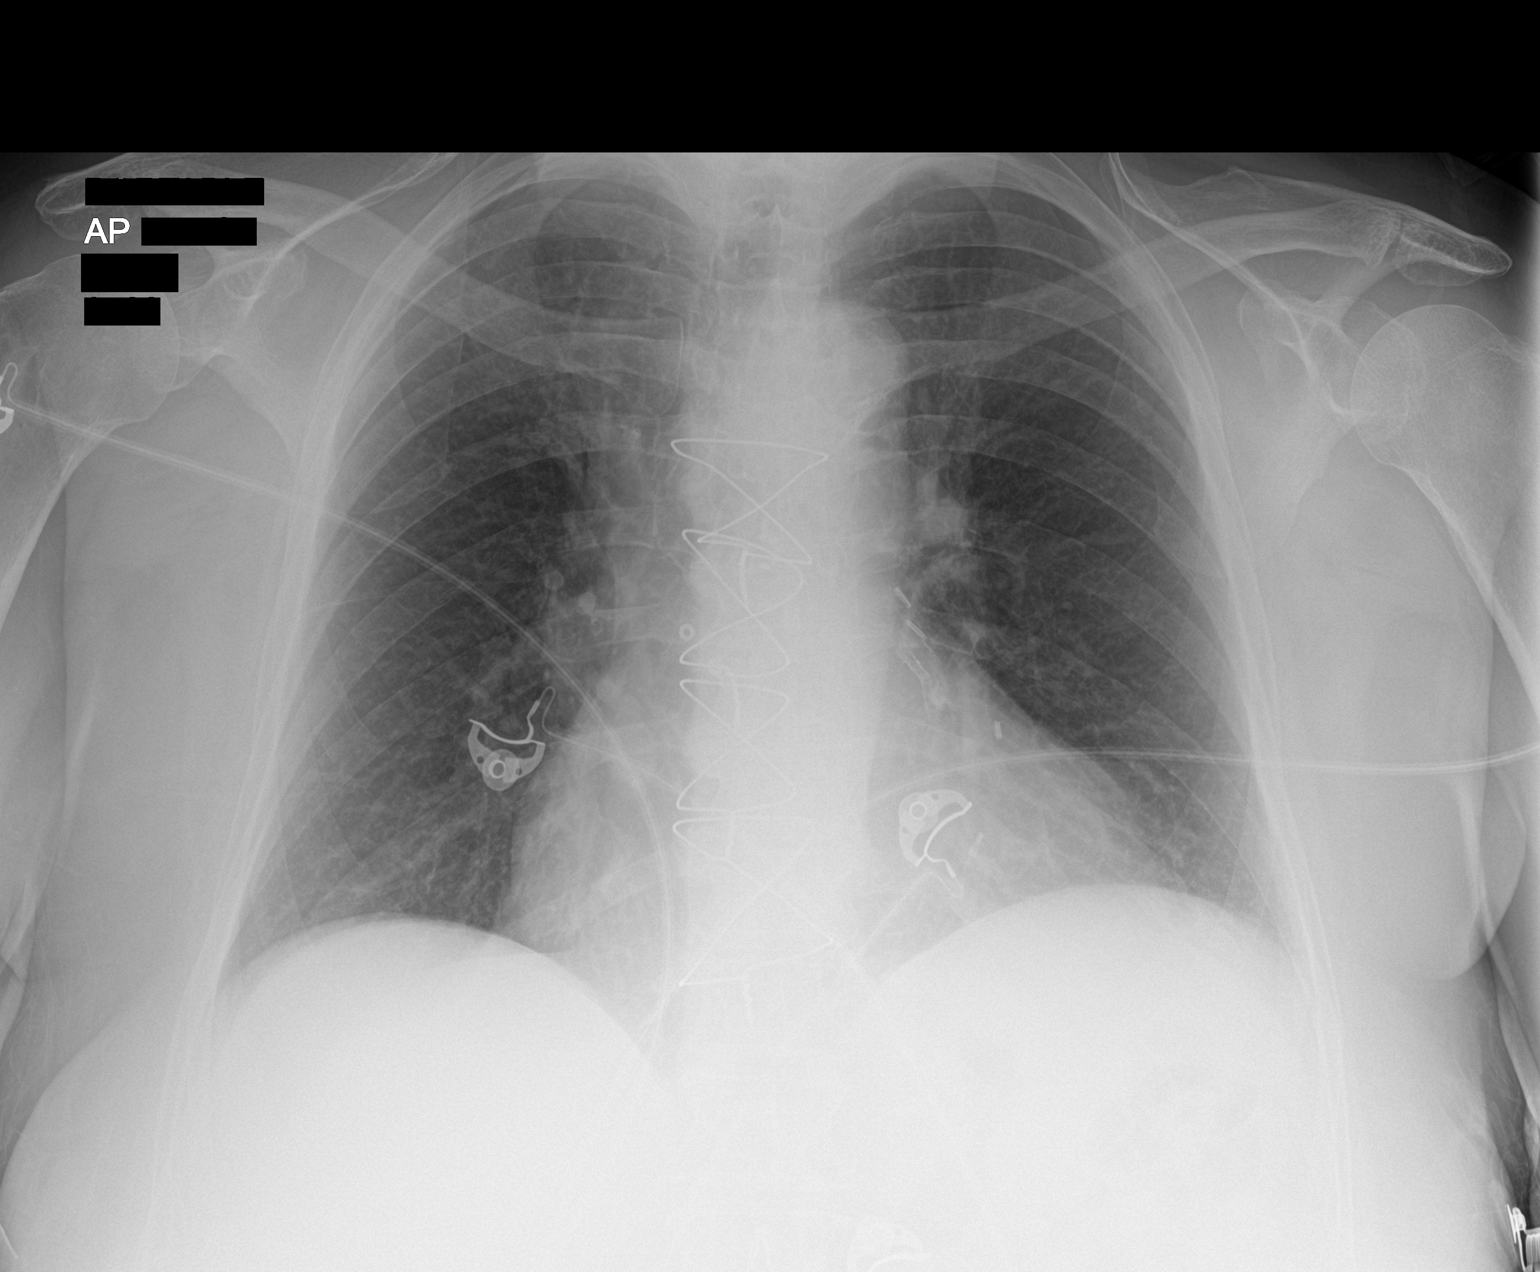

[1 of 1 positions shown; findings below may reference images not displayed]

FINDINGS: Chronic cardiopericardial enlargement. Status post CABG and coronary
stenting. There is no edema, consolidation, effusion, or
pneumothorax. No acute osseous finding.
IMPRESSION: Stable.  No evidence of acute disease.

## 2018-03-11 IMAGING — CT CT HEAD W/O CM
3 series · 15 of 47 positions shown, 18 images · non-contrast
Comparison: CT brain scan of 09/25/2012

CLINICAL DATA: Dizziness, elevated blood pressure, intermittent
chest pain, headache

EXAM:
CT HEAD WITHOUT CONTRAST
TECHNIQUE: Contiguous axial images were obtained from the base of the skull
through the vertex without intravenous contrast.

[Series 2: head wo · axial · 0.40mm/px · z∈[+913,+1038]mm · 9 of 30 slices shown, 12 images]
[im 3/30  brain]
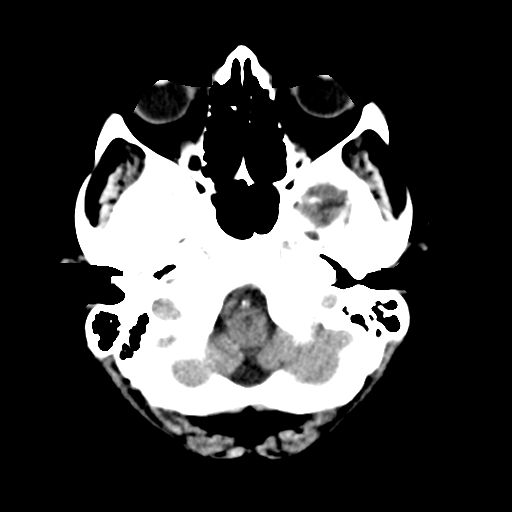
[im 3/30  bone]
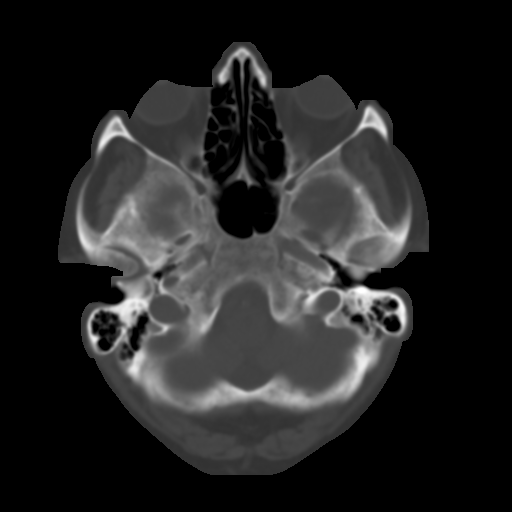
[im 6/30  brain]
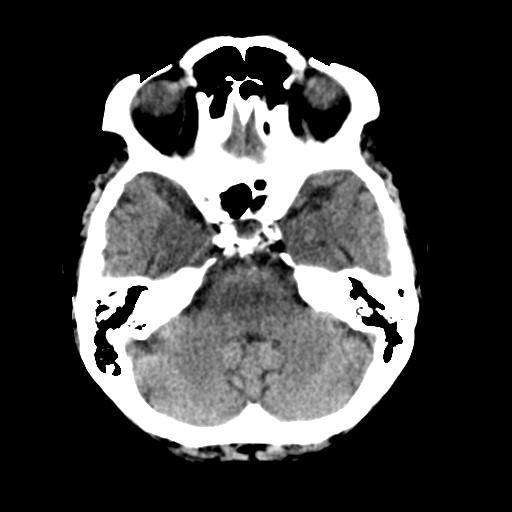
[im 9/30  brain]
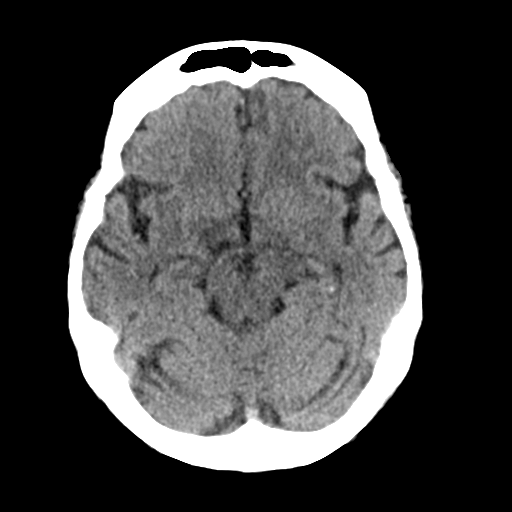
[im 12/30  brain]
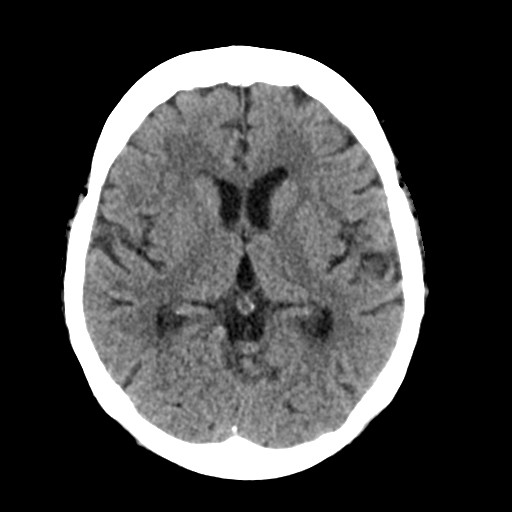
[im 16/30  brain]
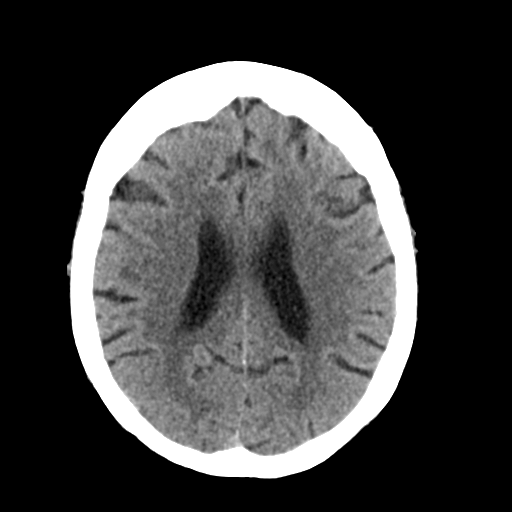
[im 16/30  bone]
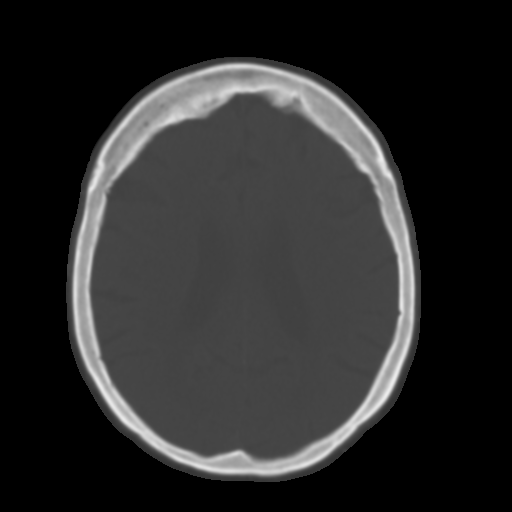
[im 19/30  brain]
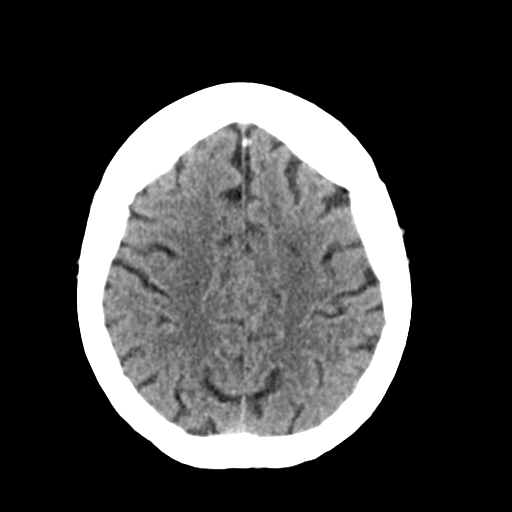
[im 22/30  brain]
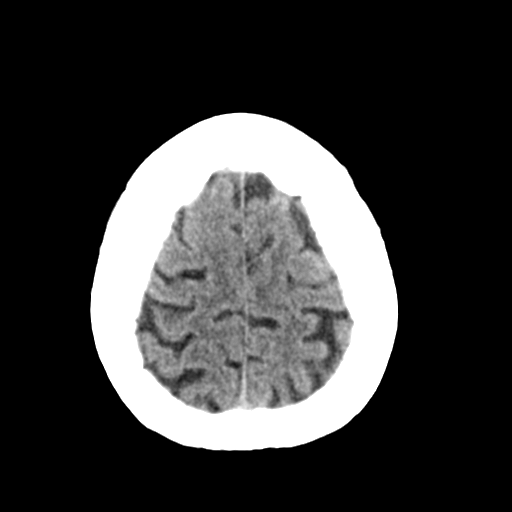
[im 25/30  brain]
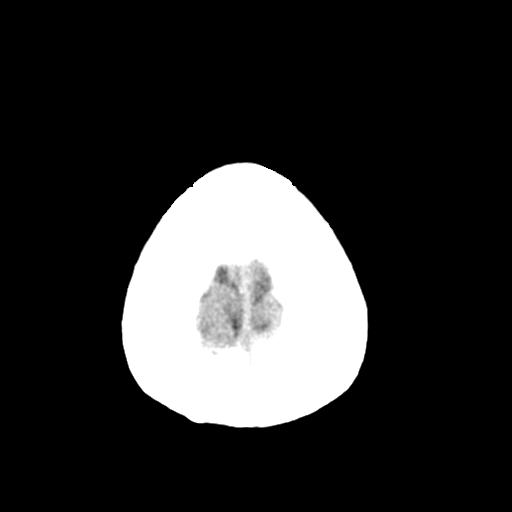
[im 28/30  brain]
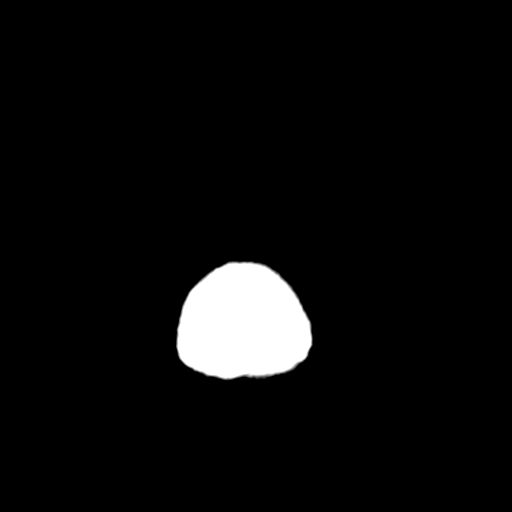
[im 28/30  bone]
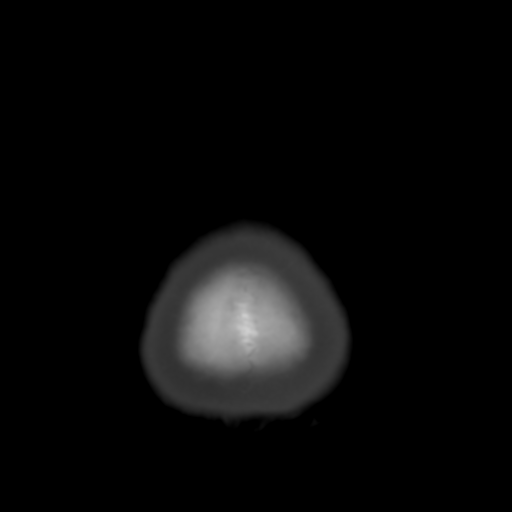

[Series 4: coronal soft tissue · coronal · 0.29mm/px · 3 of 60 slices shown]
[im 20/60  brain]
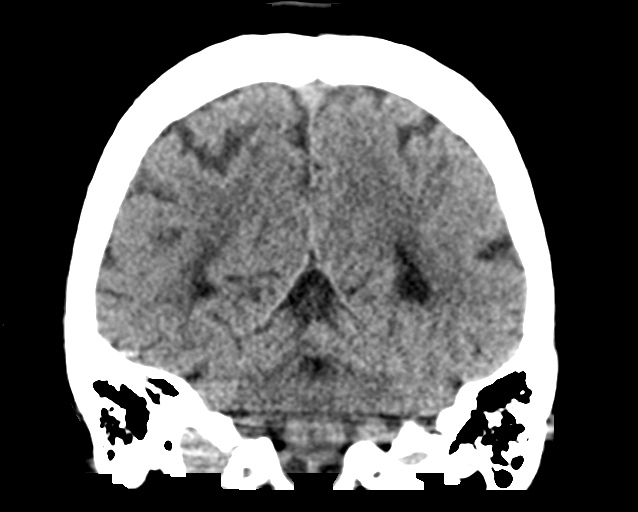
[im 27/60  brain]
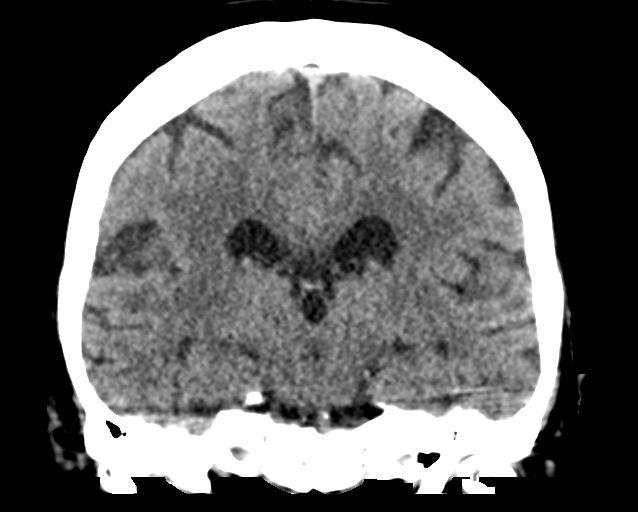
[im 33/60  brain]
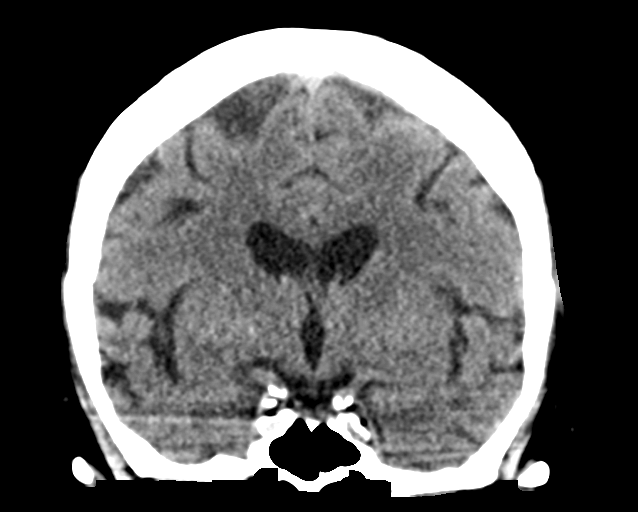

[Series 5: sagittal soft tissue · sagittal · 0.30mm/px · 3 of 48 slices shown]
[im 16/48  brain]
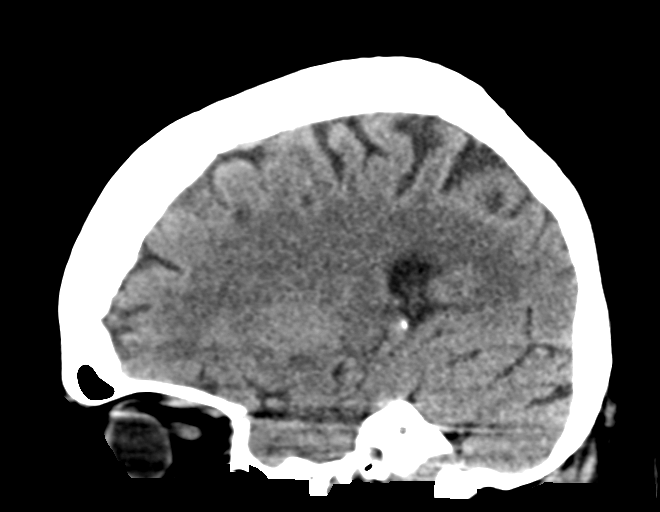
[im 24/48  brain]
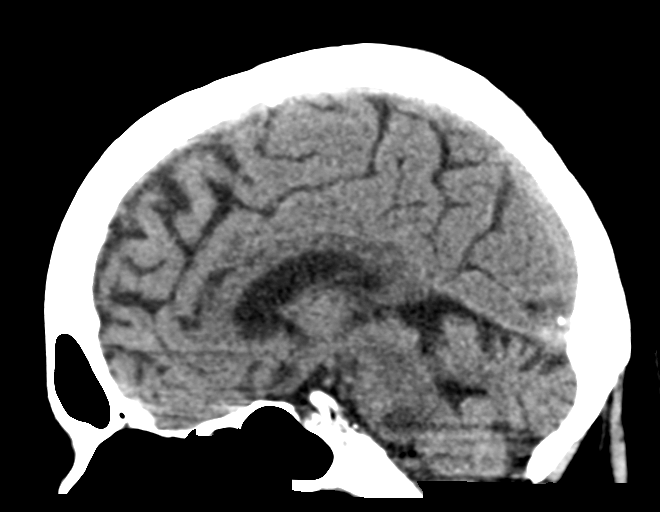
[im 32/48  brain]
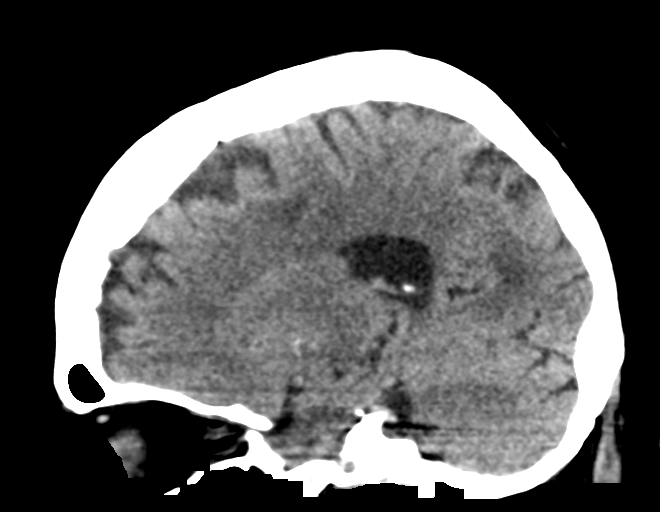

[15 of 47 positions shown; findings below may reference images not displayed]

FINDINGS: Brain: The ventricular system remains minimally prominent as are the
cortical sulci, indicative of diffuse atrophy. Septum is midline in
position. The fourth ventricle and basilar cisterns are
unremarkable. There has been slight progression of small vessel
ischemic change particularly the left periventricular white matter
when compared to the prior CT. However, no hemorrhage, mass lesion,
or acute infarction is seen.

Vascular: No vascular abnormality is noted on this unenhanced study.

Skull: No calvarial abnormality is seen.

Sinuses/Orbits: The paranasal sinuses are well pneumatized.

Other: None
IMPRESSION: Stable atrophy and slight progression of small vessel ischemic
change in the periventricular white matter. No acute intracranial
abnormality.

## 2018-04-07 IMAGING — DX DG CHEST 1V PORT
1 series · 1 of 1 positions shown · non-contrast
Comparison: 11/25/2015 chest radiograph.

CLINICAL DATA: Chest pain

EXAM:
PORTABLE CHEST 1 VIEW

[chest ap]
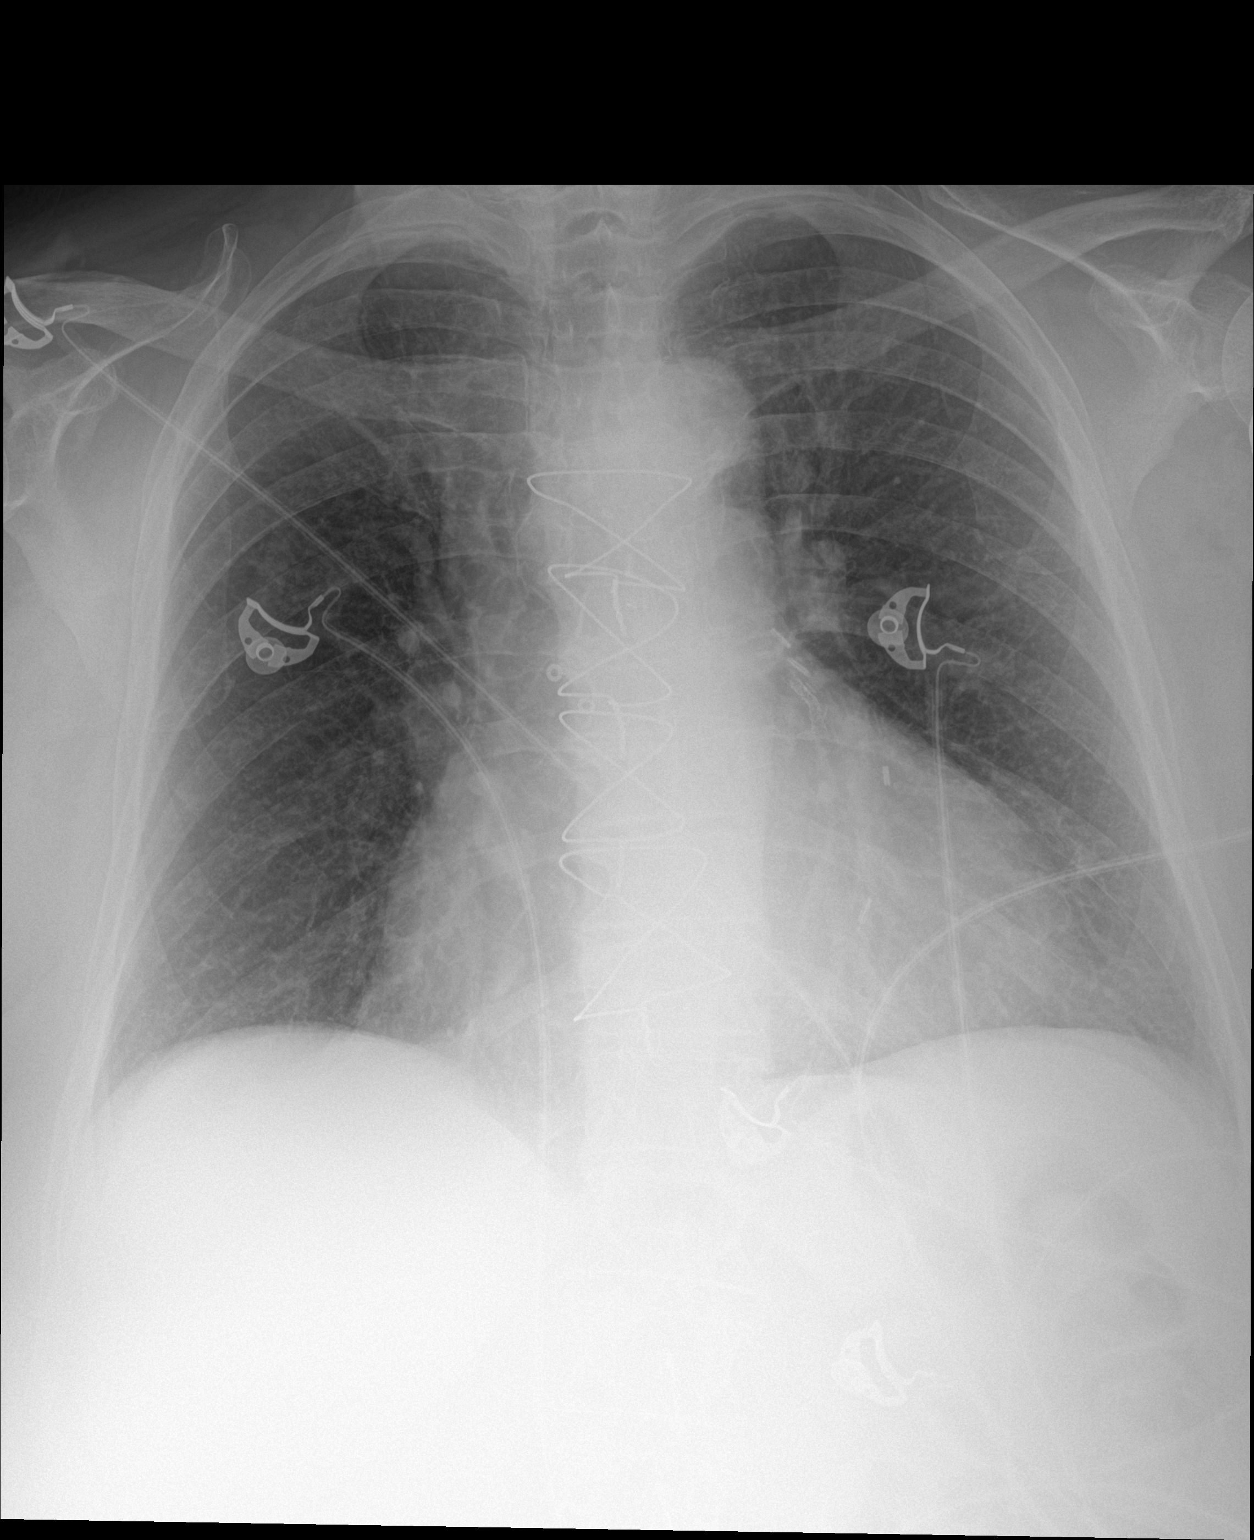

[1 of 1 positions shown; findings below may reference images not displayed]

FINDINGS: Sternotomy wires appear aligned and intact. CABG clips overlie the
mediastinum. Coronary stent overlies the left heart. Stable
cardiomediastinal silhouette with top-normal heart size. No
pneumothorax. No pleural effusion. Lungs appear clear, with no acute
consolidative airspace disease and no pulmonary edema.
IMPRESSION: No active disease.

## 2018-04-20 IMAGING — CR DG CHEST 2V
2 series · 2 of 2 positions shown · non-contrast
Comparison: 01/24/2016

CLINICAL DATA: Chest pain starting 12 p.m. today

EXAM:
CHEST  2 VIEW

[chest lat]
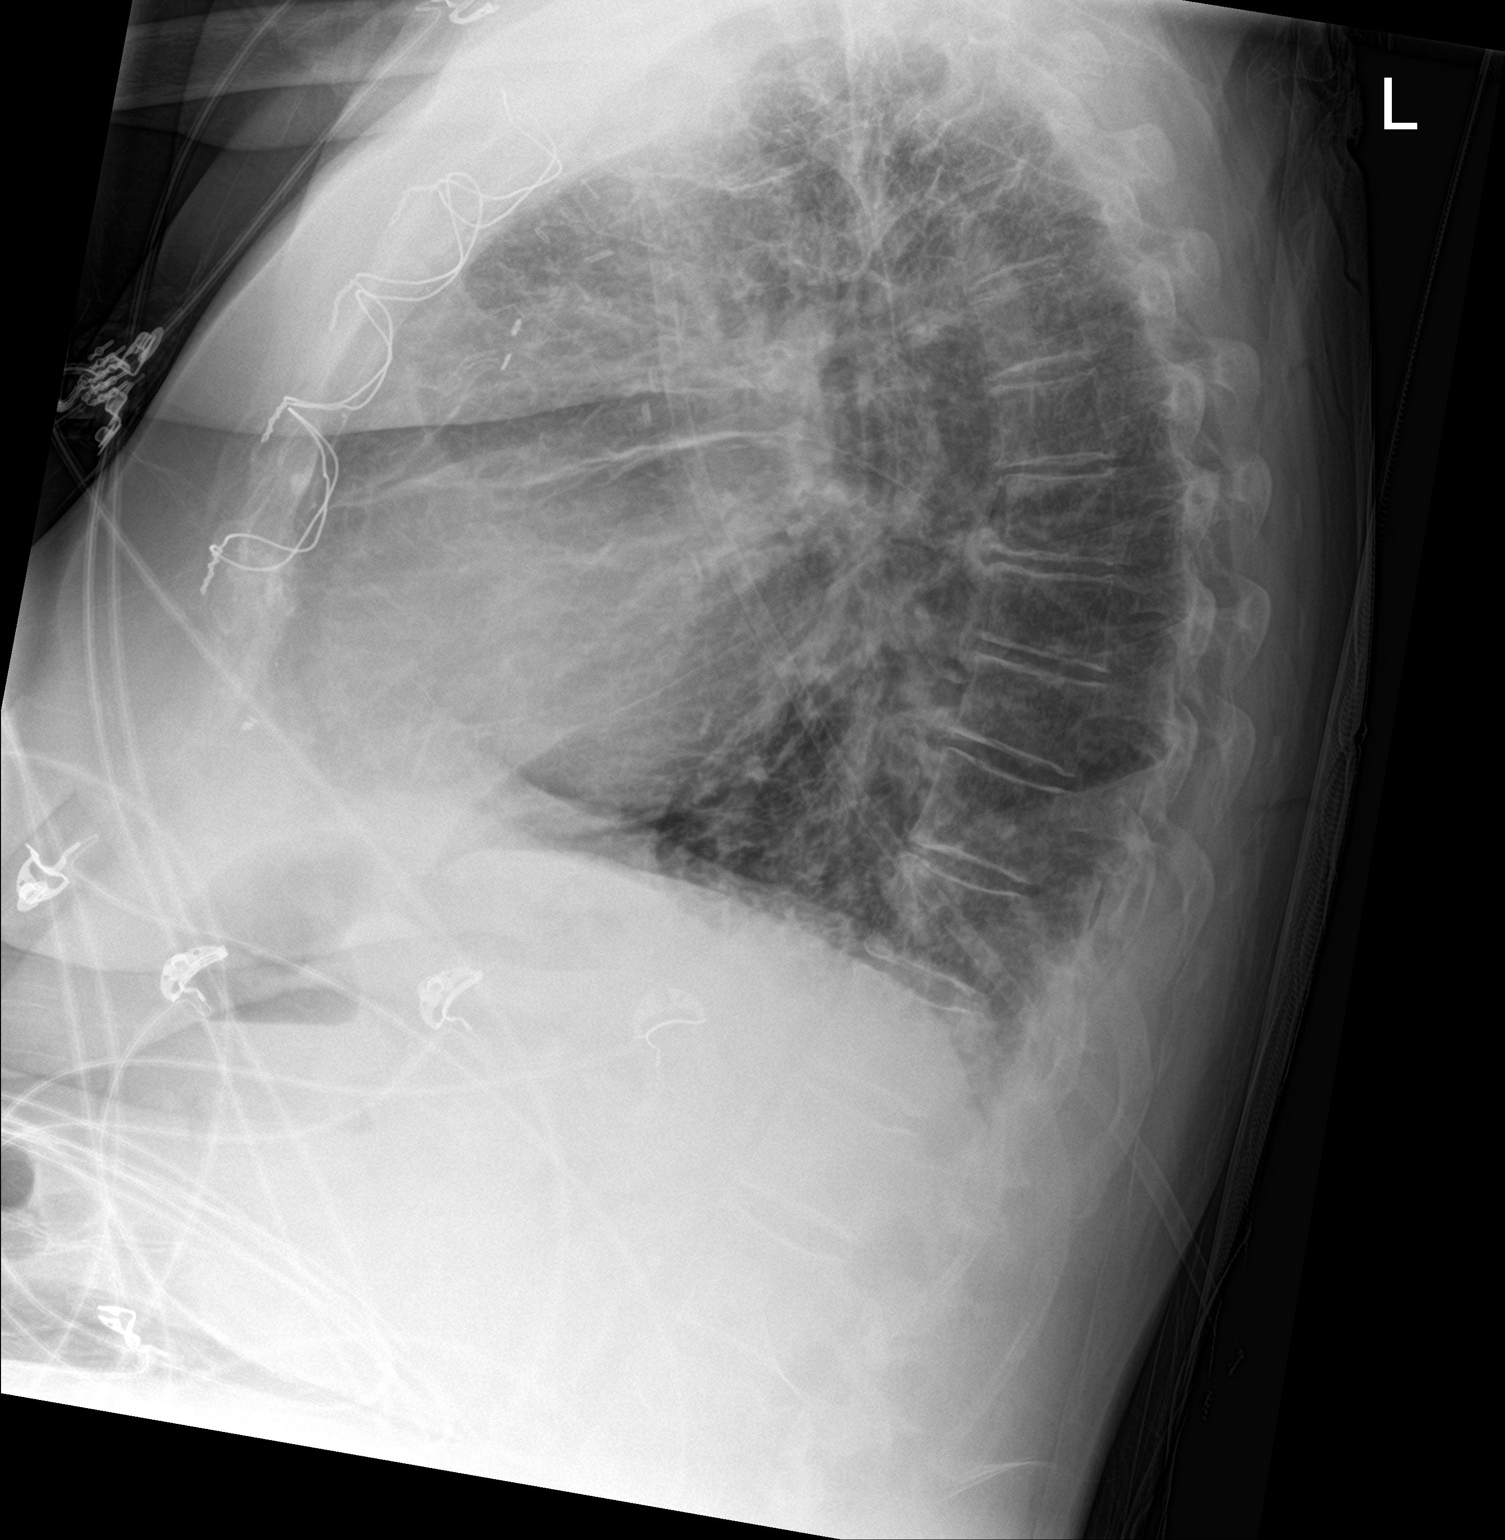

[chest ap]
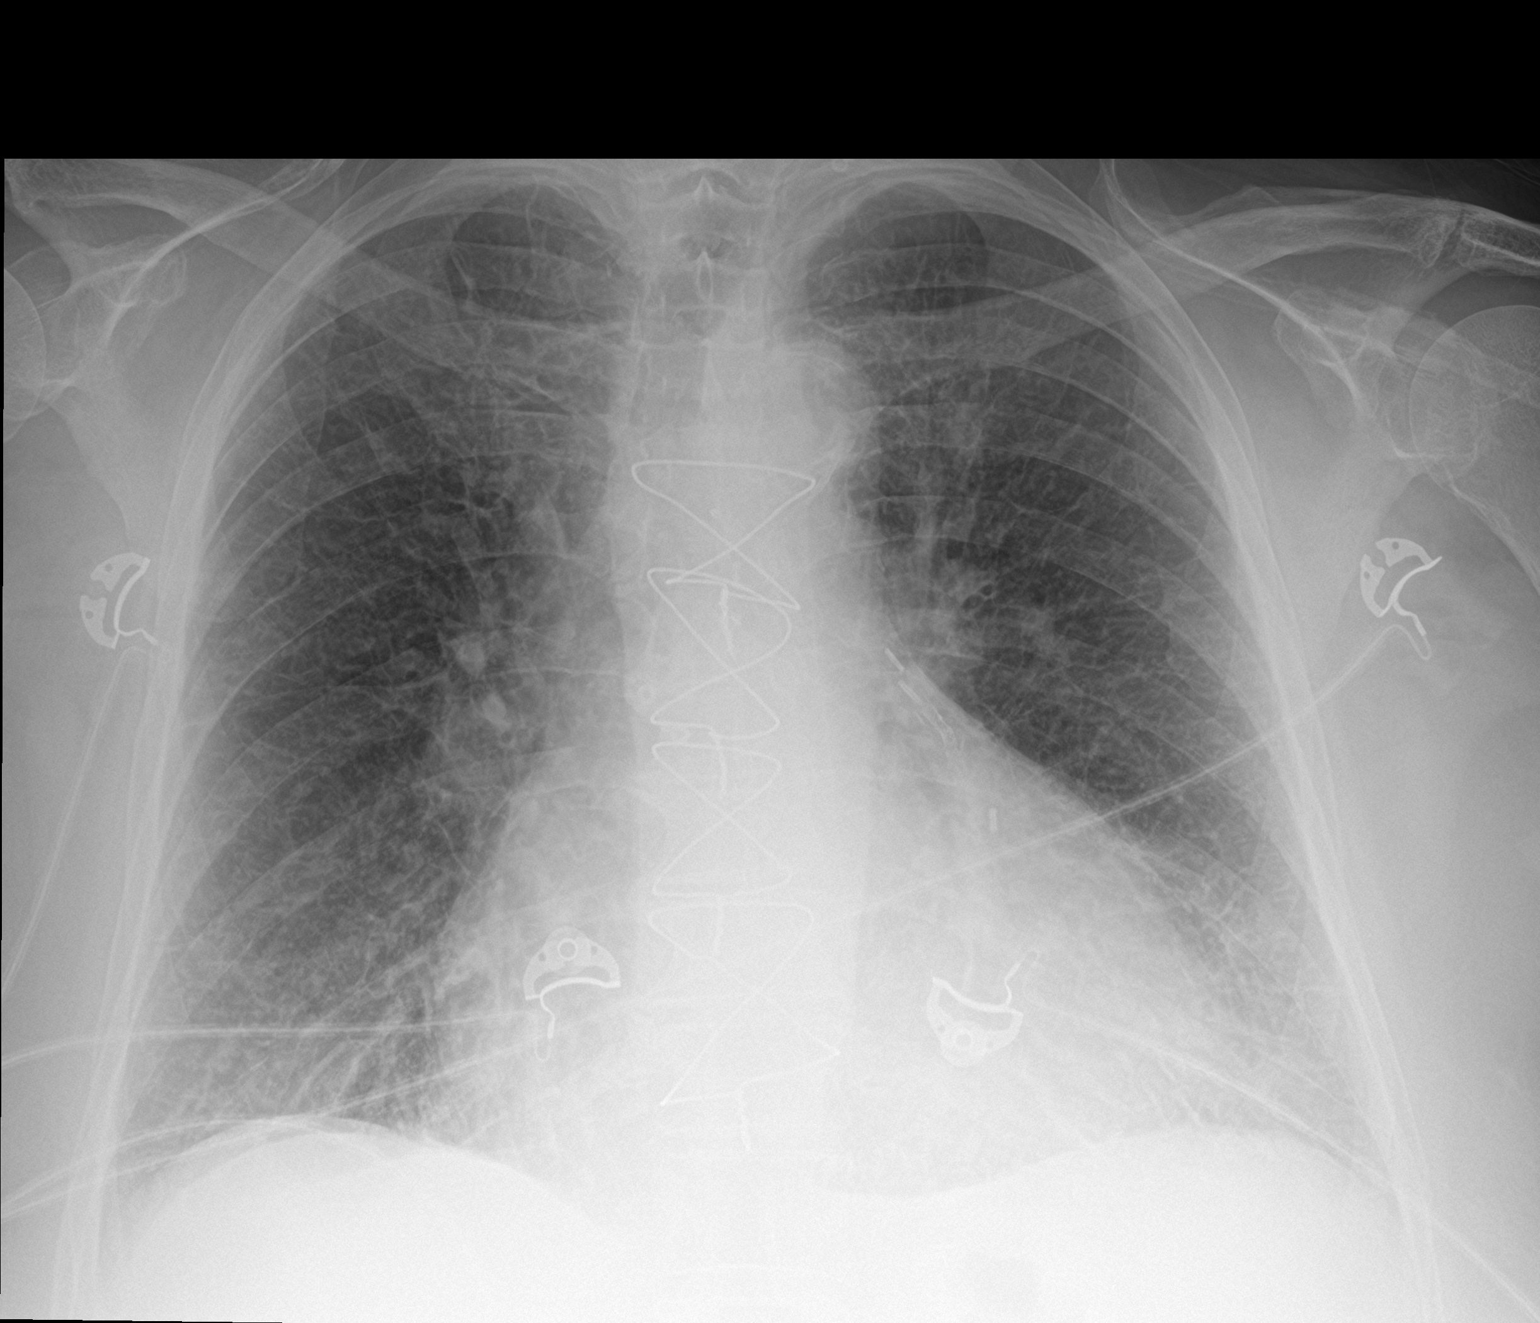

[2 of 2 positions shown; findings below may reference images not displayed]

FINDINGS: Cardiomegaly is noted. No infiltrate or pleural effusion. No
pulmonary edema. Osteopenia and mild degenerative change thoracic
spine. Status post median sternotomy.
IMPRESSION: No active cardiopulmonary disease. Cardiomegaly again noted. Status
post median sternotomy.

## 2018-04-20 IMAGING — CR DG ABDOMEN 2V
3 series · 3 of 3 positions shown · non-contrast
Comparison: Prior radiograph from 05/07/2014.

CLINICAL DATA: Initial evaluation for acute abdominal pain, fever.

EXAM:
ABDOMEN - 2 VIEW

[abdomen erect]
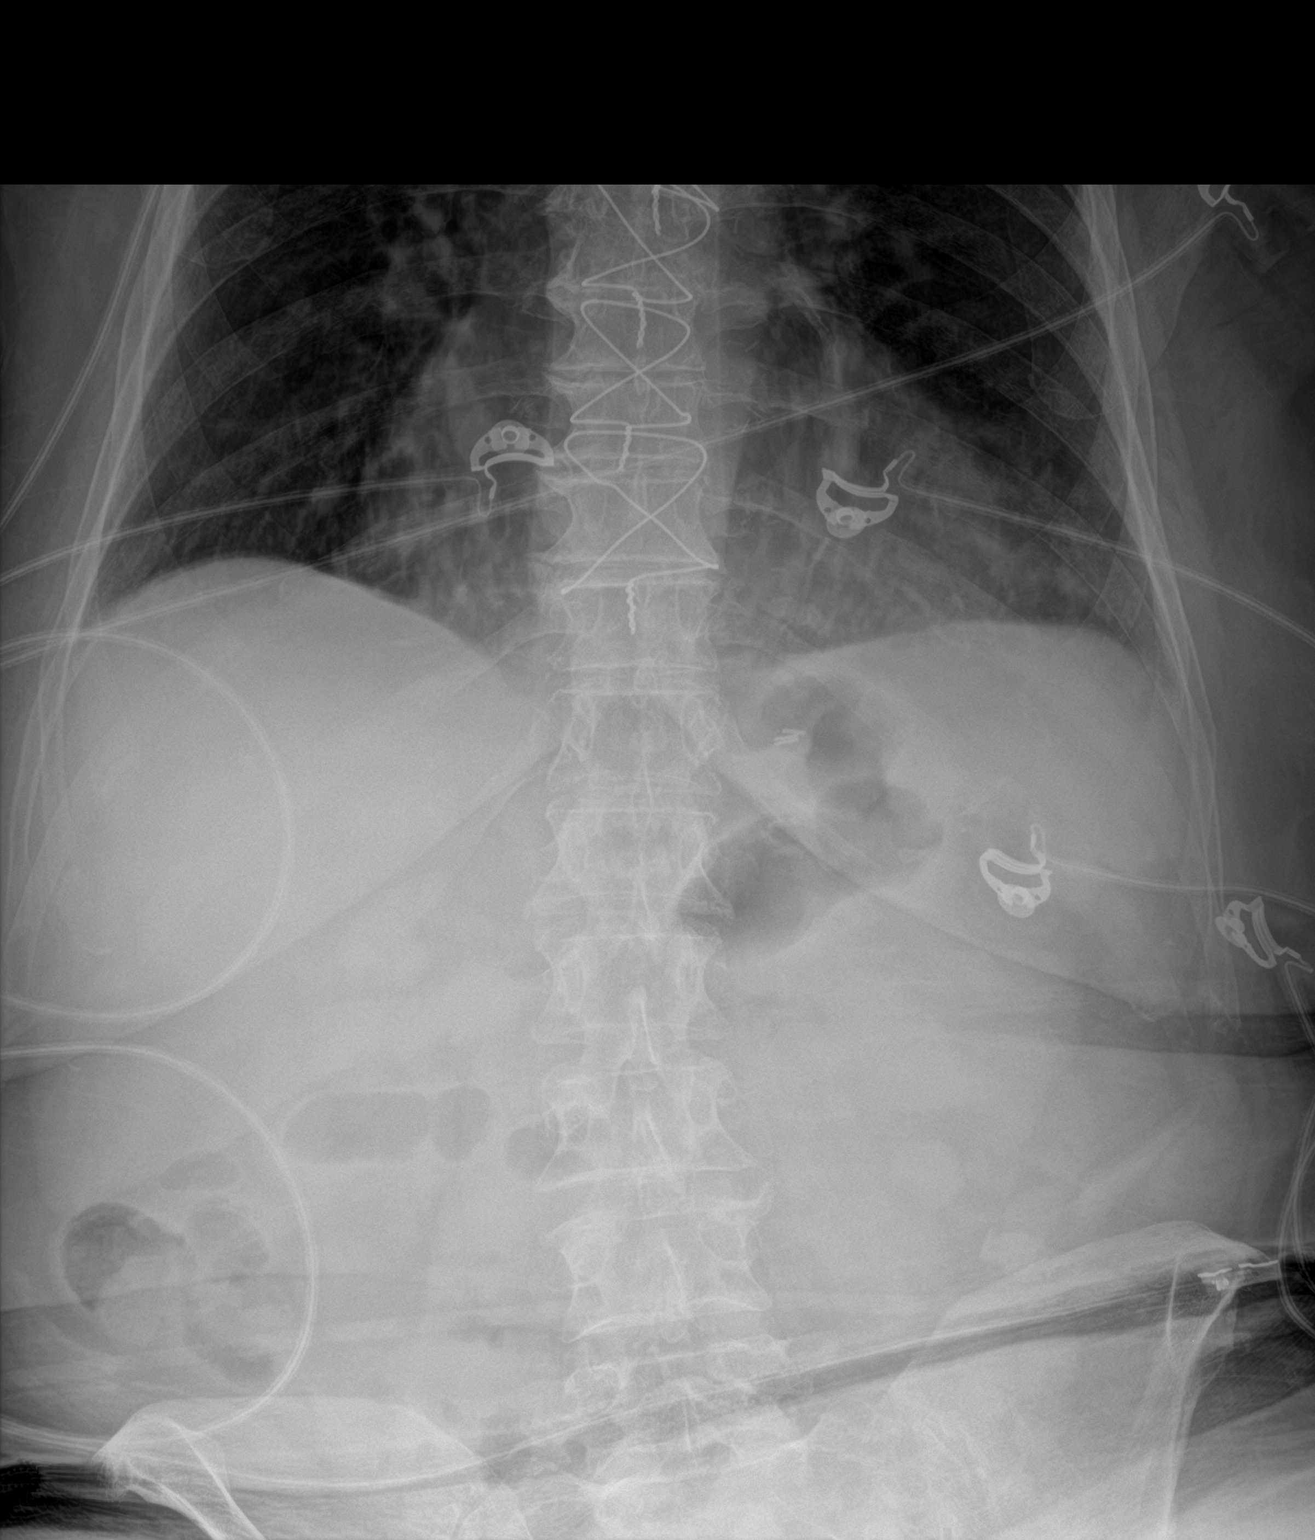

[abdomen supine (1 of 2)]
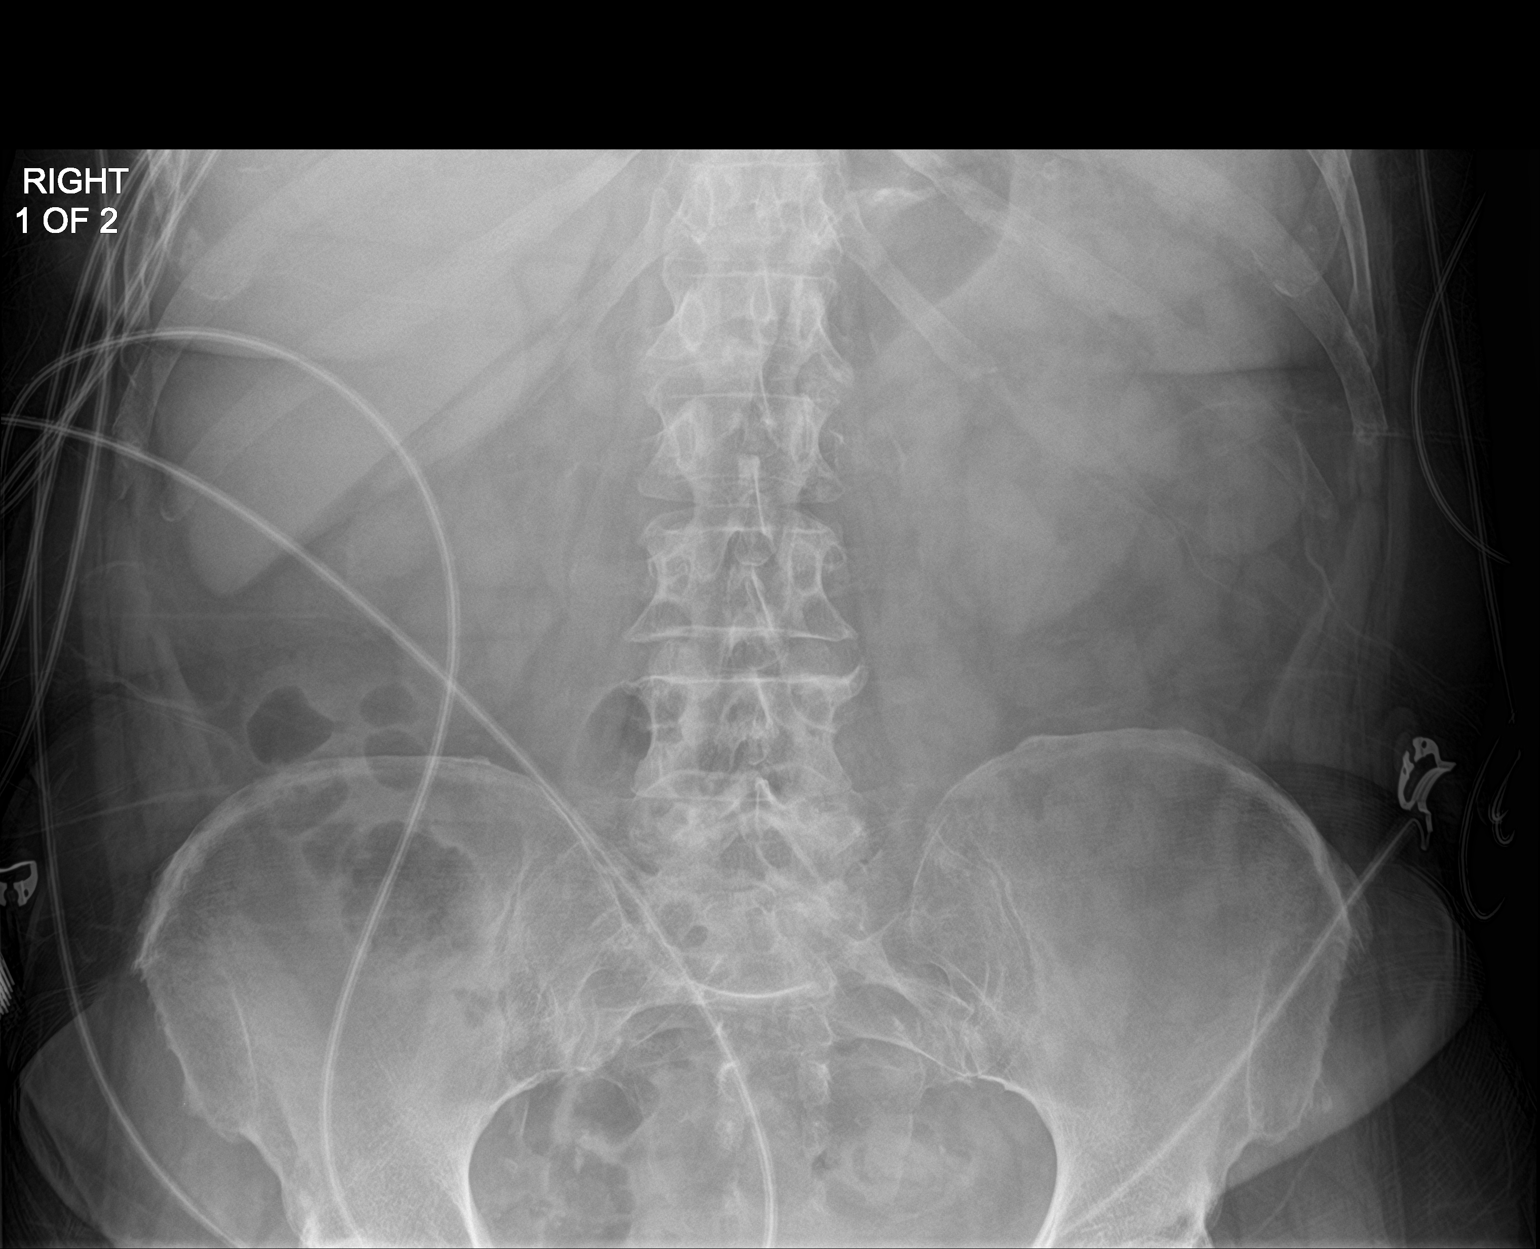

[abdomen supine (2 of 2)]
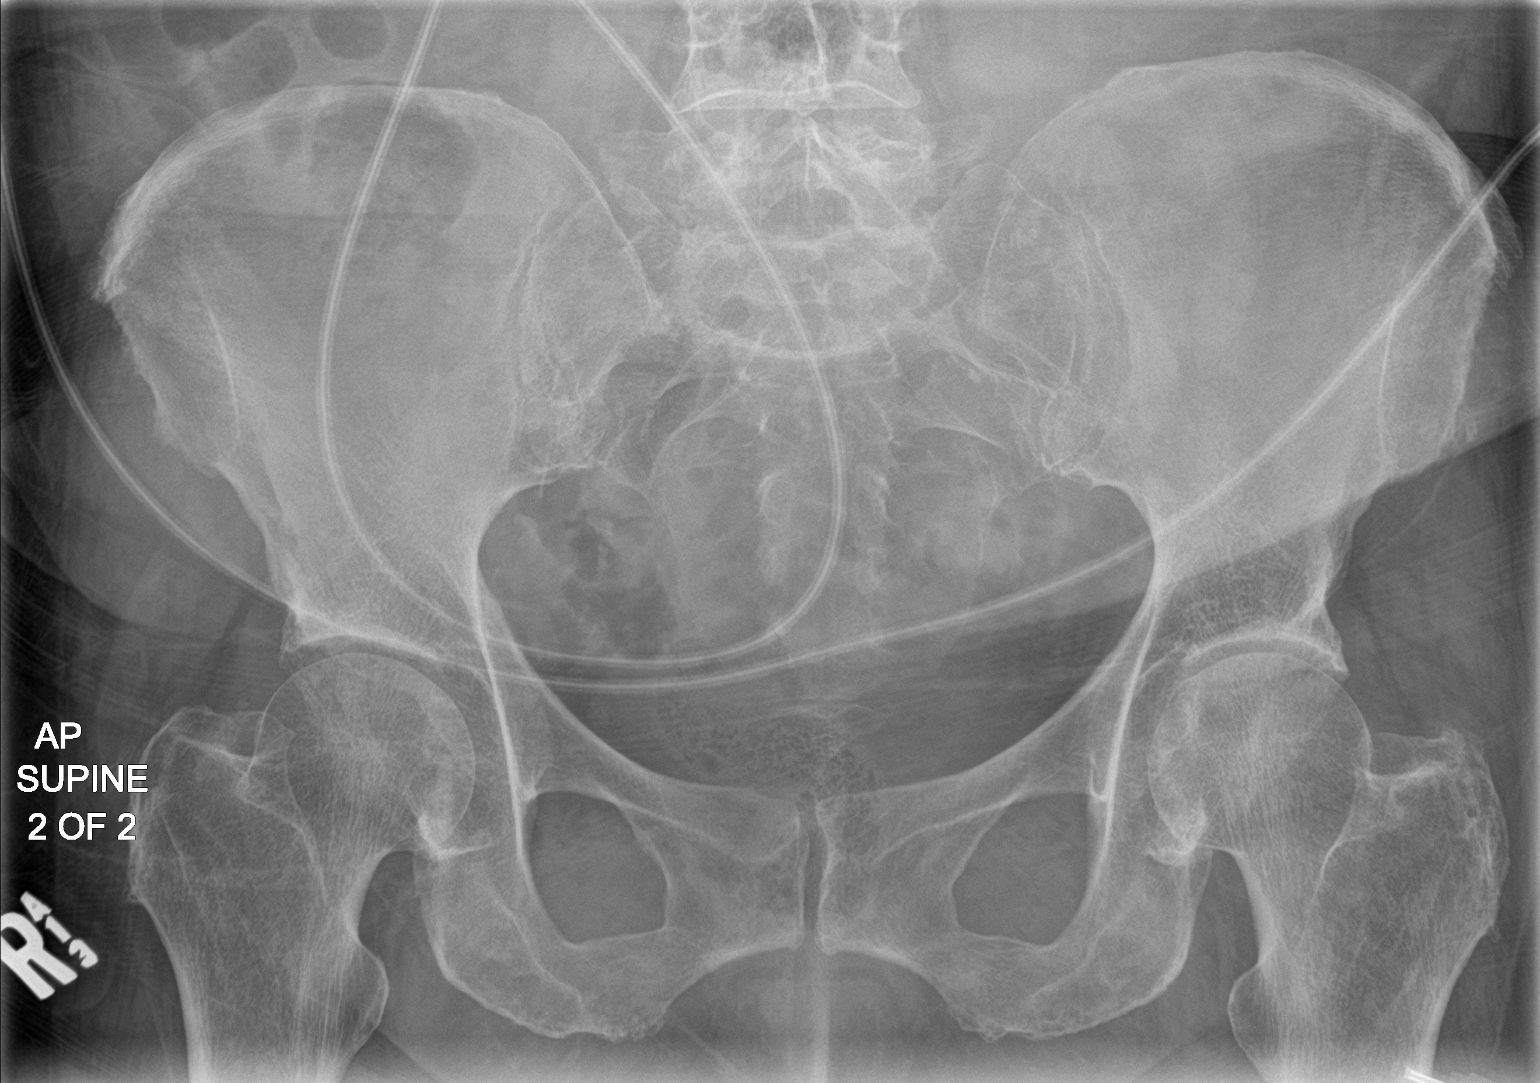

[3 of 3 positions shown; findings below may reference images not displayed]

FINDINGS: Bowel gas pattern within normal limits without evidence for
obstruction or ileus. No abnormal bowel wall thickening. No free
air. No soft tissue mass or abnormal calcification.

Median sternotomy wires noted. Scattered degenerative changes noted
within the visualized spine. Degenerative changes noted about the
hips bilaterally as well.
IMPRESSION: Nonobstructive bowel gas pattern with no radiographic evidence for
acute intra-abdominal or pelvic process.
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
## 2018-05-11 IMAGING — CT CT CTA ABD/PEL W/CM AND/OR W/O CM
2 of 7 series · 12 of 46 positions shown, 14 images · IV contrast (isovue)
Comparison: CT abdomen 02/06/2016

CLINICAL DATA: Severe epigastric pain radiating to the back with
nausea she has a history of renal artery thrombosis

EXAM:
CT ANGIOGRAPHY CHEST, ABDOMEN AND PELVIS
TECHNIQUE: Multidetector CT imaging through the chest, abdomen and pelvis was
performed using the standard protocol during bolus administration of
intravenous contrast. Multiplanar reconstructed images and MIPs were
obtained and reviewed to evaluate the vascular anatomy.
CONTRAST:  100 mL Isovue 370 intravenous

[Series 6: axial arterial · axial · arterial · 0.87mm/px · z∈[-765,-249]mm · 9 of 205 slices shown, 11 images]
[im 22/205  soft-tissue]
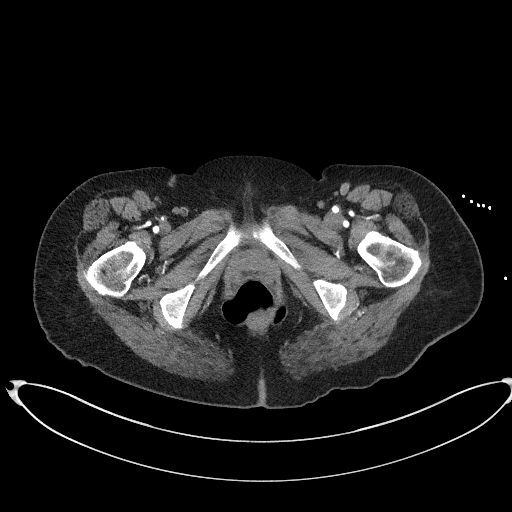
[im 22/205  bone]
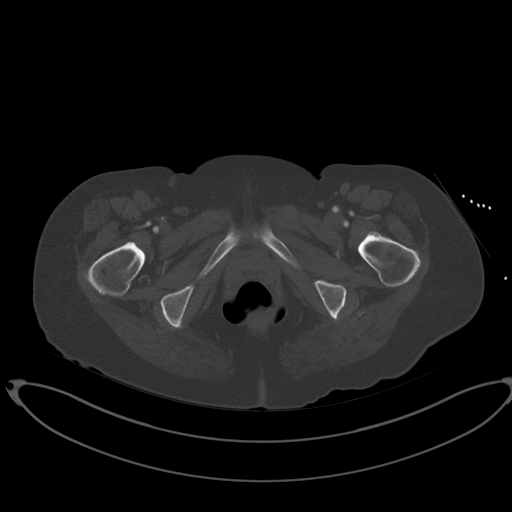
[im 43/205  soft-tissue]
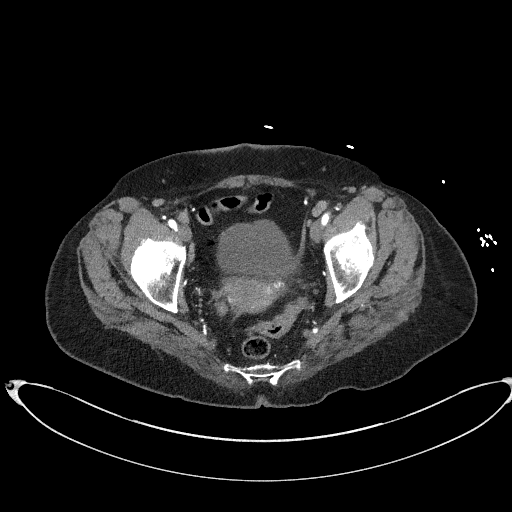
[im 65/205  soft-tissue]
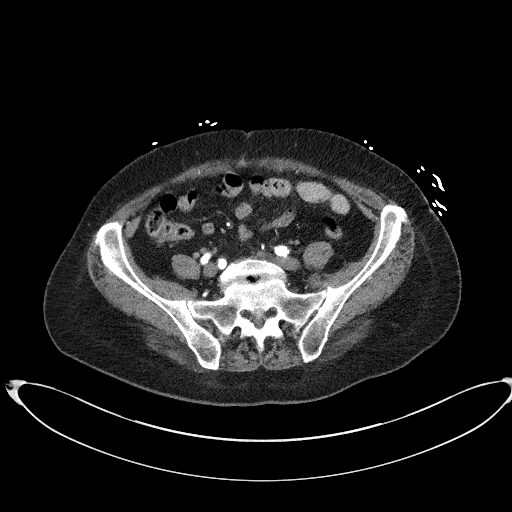
[im 86/205  soft-tissue]
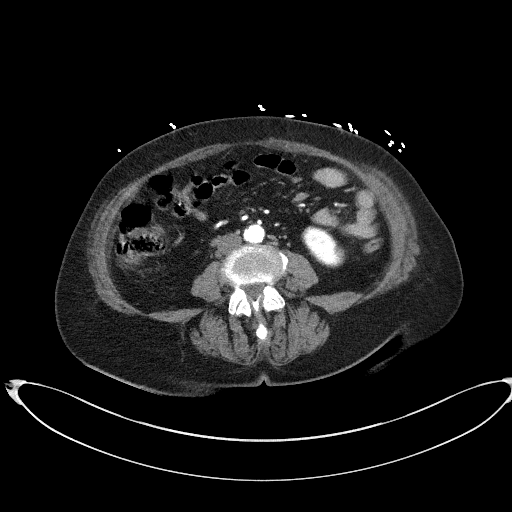
[im 108/205  soft-tissue]
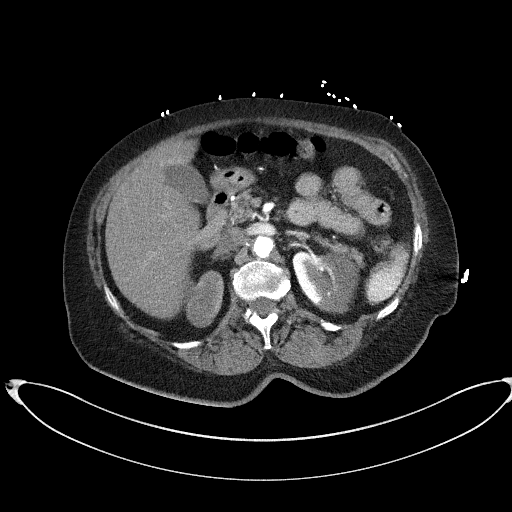
[im 129/205  soft-tissue]
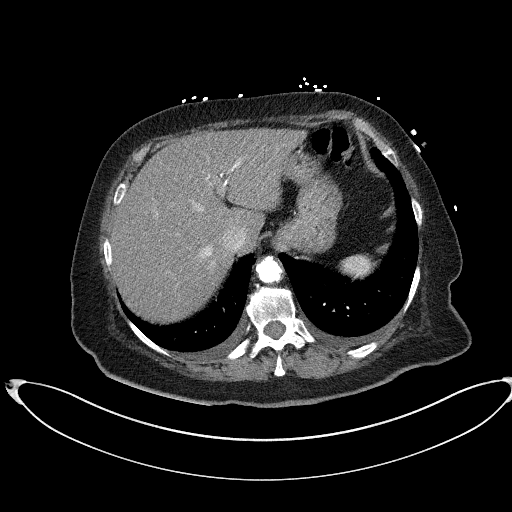
[im 151/205  soft-tissue]
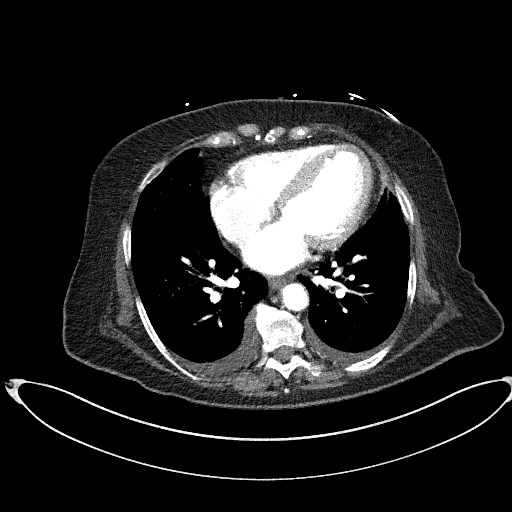
[im 172/205  soft-tissue]
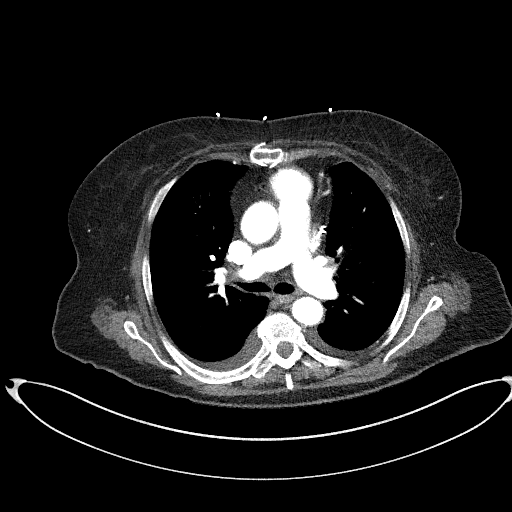
[im 194/205  soft-tissue]
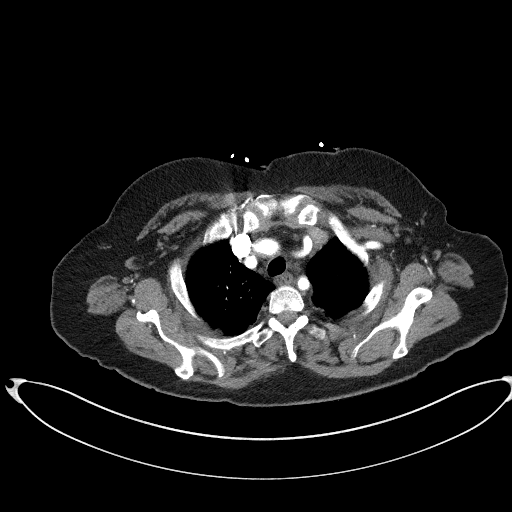
[im 194/205  bone]
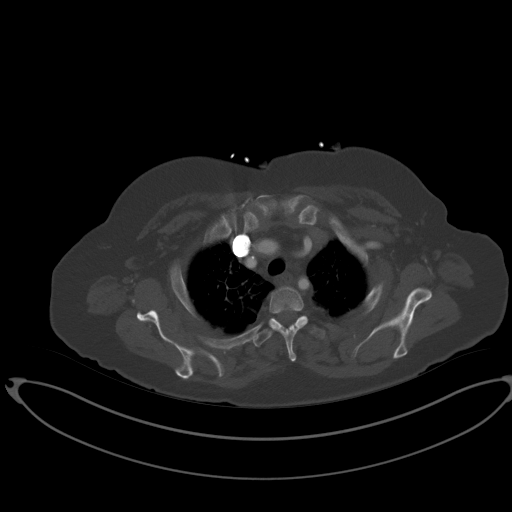

[Series 8: coronals · coronal · 0.79mm/px · 3 of 130 slices shown]
[im 33/130  soft-tissue]
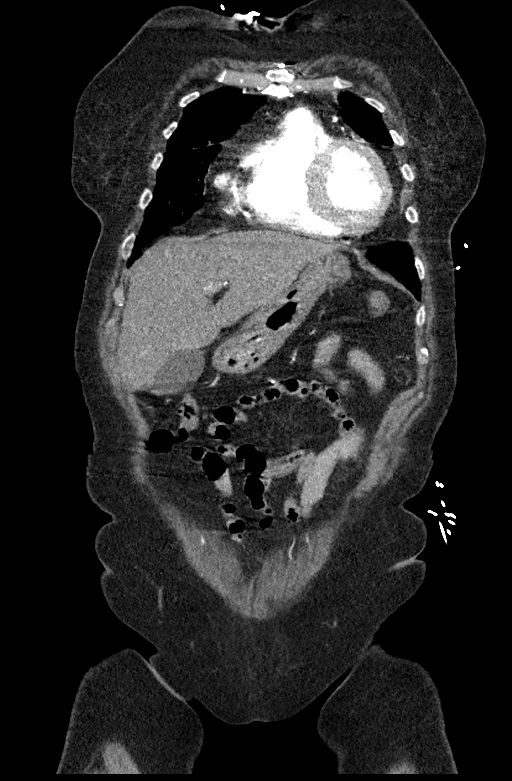
[im 65/130  soft-tissue]
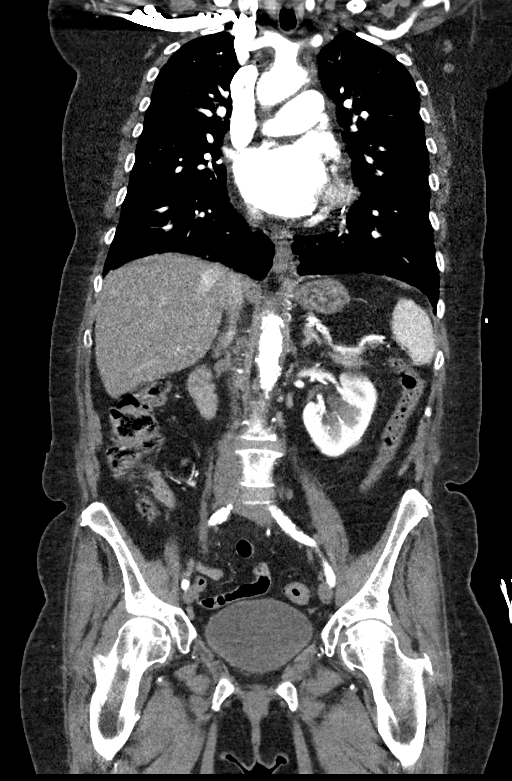
[im 97/130  soft-tissue]
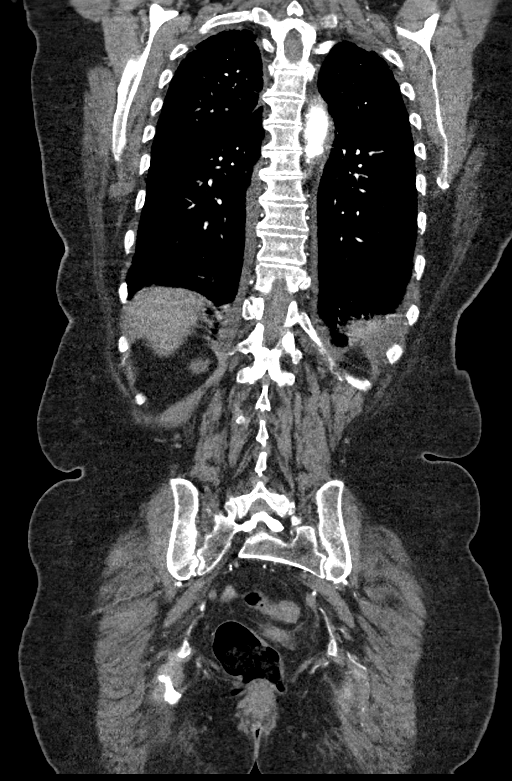

[12 of 46 positions shown; findings below may reference images not displayed]

FINDINGS: CTA CHEST FINDINGS

Cardiovascular: There is no dissection involving the ascending or
descending thoracic aorta. There is common origin of the left common
carotid artery and the right brachiocephalic artery. Left subclavian
artery has its takeoff from the distal arch which is left-sided.
There is atherosclerosis of the aorta. There are post CABG changes
present. Central pulmonary arteries demonstrate no filling defects.
There is coronary artery calcification.

There is an oval filling defect within the left atrial appendage,
series 6, image number 38 which is suspicious for a thrombus. The
heart is slightly enlarged. There is no significant pericardial
effusion.

Mediastinum/Nodes: There are mildly enlarged mediastinal lymph
nodes. A node anterior to the aortic arch measures 1.3 cm. A right
paratracheal lymph node measures 1.4 cm. No hilar adenopathy. Imaged
thyroid gland within normal limits. Trachea is midline. Esophagus
grossly unremarkable.

Lungs/Pleura: Hazy attenuation is present bilaterally, which may
represent diffuse atelectasis or mild edema, there is suggestion of
mild septal thickening. There are small bilateral pleural effusions
present. There is no acute consolidation.

Musculoskeletal: There are degenerative changes. Post sternotomy
changes. No acute osseous abnormality.

Review of the MIP images confirms the above findings.

CTA ABDOMEN AND PELVIS FINDINGS

VASCULAR

Aorta: Atherosclerosis and mural thrombus is present within the
abdominal aorta. No discrete aneurysm is visualized. There is a
small linear density within the distal abdominal aorta just above
the bifurcation which may represent a small intimal flap, series 6,
image number 124.

Celiac: There is severe stenosis of the origin of the celiac artery.

SMA: Calcification and plaque are present at the origin of the SMA
with mild, less than 50% stenosis suggested.

Renals: The right renal artery is occluded at its origin. Moderate
severe stenosis suggested at the origin of the left renal artery. No
definitive thrombus is visualized within the renal artery.

IMA: Calcifications and thrombus present at the origin of the IMA,
IMA is patent after the origin.

Inflow: Moderate atherosclerotic calcification and mural thrombus of
the distal aorta. Scattered atherosclerotic calcification within the
right external iliac artery. Severe stenosis of the proximal SFA on
the right with eventual occlusion of the proximal to mid superficial
femoral artery. Mild atherosclerotic calcification of the left
external iliac artery. Mild calcification at the left common femoral
artery. There is patency of the left superficial femoral artery
proximally.

Veins: Suboptimally evaluated on arterial study

Review of the MIP images confirms the above findings.

NON-VASCULAR

Hepatobiliary: There is no focal hepatic abnormality visualized. No
calcified gallstones. No biliary dilatation.

Pancreas: Unremarkable. No pancreatic ductal dilatation or
surrounding inflammatory changes.

Spleen: Normal in size without focal abnormality.

Adrenals/Urinary Tract: The adrenal glands are within normal limits.
Again visualized is hypoperfusion of the right kidney consistent
with infarction, renal size has decreased compared to the prior
study. Now seen is wedge-shaped hypodensity within the left kidney
consistent with left renal infarction. The urinary bladder is
unremarkable.

Stomach/Bowel: Stomach is nonenlarged. There is no dilated small
bowel. No colon wall thickening. There is diverticular disease
without acute inflammation.

Lymphatic: No significantly enlarged lymph nodes.

Reproductive: Probable partially calcified uterine fibroid. No
adnexal mass.

Other: Trace free fluid in the pelvis.  No free air.

Musculoskeletal: Grade 1 anterolisthesis of L5 on S1 with bilateral
chronic pars defect.

Review of the MIP images confirms the above findings.
IMPRESSION: 1. No evidence for acute dissection involving the ascending or
descending thoracic aorta. Suspect short segment focal dissection
involving the distal abdominal aorta just above the bifurcation.
2. Oval filling defect within the left atrial appendage is
suspicious for thrombus. Suggest correlation with echocardiography.
3. Hypoperfused right kidney with occluded right renal artery. Right
kidney has decreased in size compared to the prior CT scan. New
wedge-shaped peripheral hypodensities in the left kidney, consistent
with acute left renal infarctions. There is severe stenosis at the
origin of the left renal artery.
4. Severe stenosis at the origin of the celiac artery.
5. Atherosclerotic vascular disease of the iliac vessels. Severe
stenosis/occlusion of the imaged portions of the right proximal
superficial femoral artery.
Critical Value/emergent results were called by telephone at the time
of interpretation on 02/28/2016 at [DATE] to Dr. Tkd, who
verbally acknowledged these results.

## 2018-06-09 IMAGING — CT CT CTA ABD/PEL W/CM AND/OR W/O CM
2 of 10 series · 11 of 46 positions shown, 15 images · IV contrast (APPLIED)
Comparison: 02/27/2016 CT angiogram of the chest, abdomen and
pelvis.

CLINICAL DATA: 75 y/o F; chest pain with concern for pulmonary
embolus.

EXAM:
CT ANGIOGRAPHY CHEST, ABDOMEN AND PELVIS
TECHNIQUE: Multidetector CT imaging through the chest, abdomen and pelvis was
performed using the standard protocol during bolus administration of
intravenous contrast. Multiplanar reconstructed images and MIPs were
obtained and reviewed to evaluate the vascular anatomy.
CONTRAST:  100 cc Isovue 370.

[Series 6: coronals · coronal · 0.77mm/px · 2 of 135 slices shown]
[im 45/135  soft-tissue]
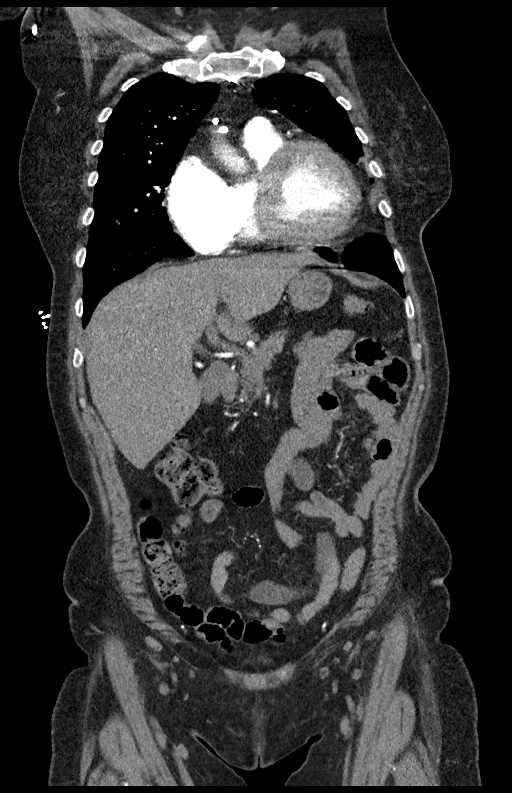
[im 90/135  soft-tissue]
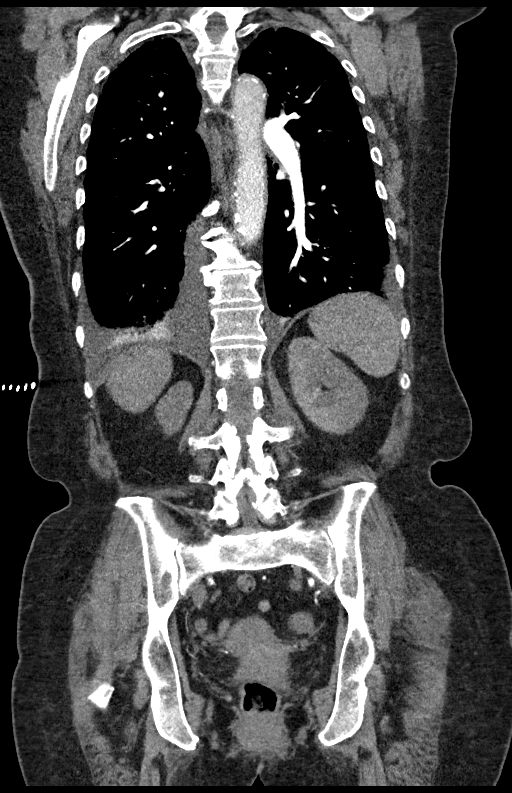

[Series 11: axial venous · axial · portal-venous · 0.93mm/px · z∈[-1008,-522]mm · 9 of 198 slices shown, 13 images]
[im 18/198  soft-tissue]
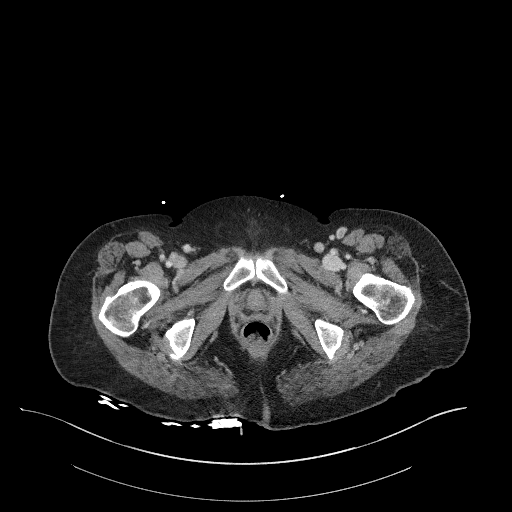
[im 18/198  bone]
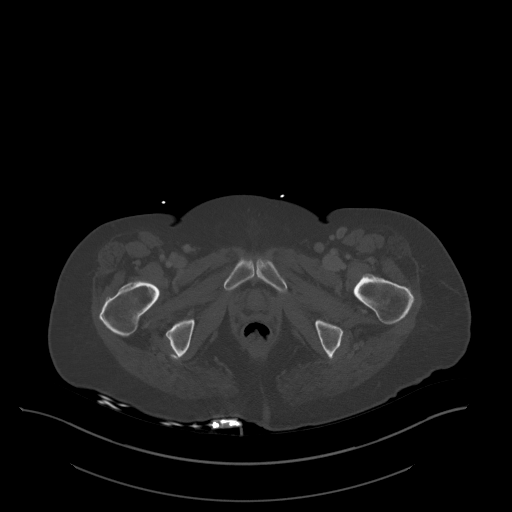
[im 36/198  soft-tissue]
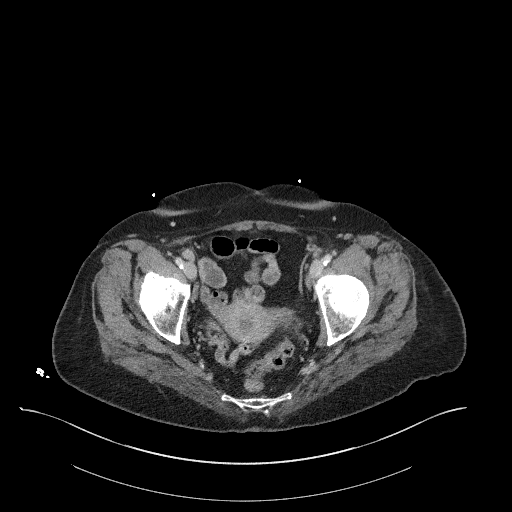
[im 72/198  soft-tissue]
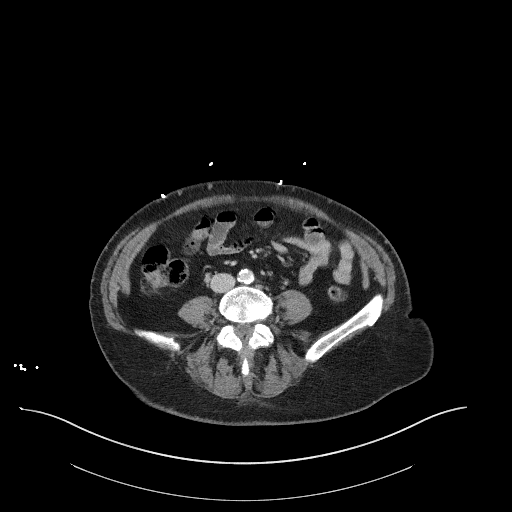
[im 90/198  soft-tissue]
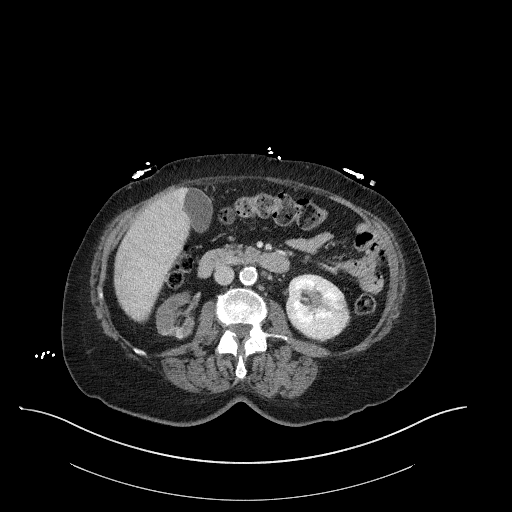
[im 108/198  soft-tissue]
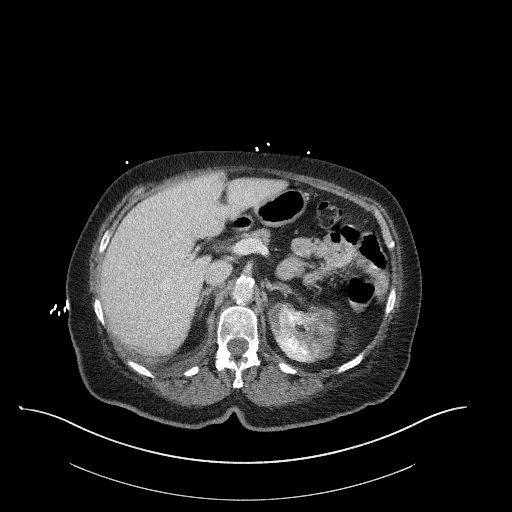
[im 126/198  soft-tissue]
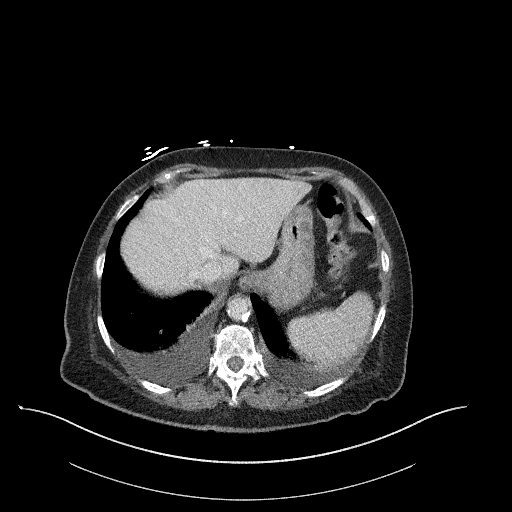
[im 126/198  lung]
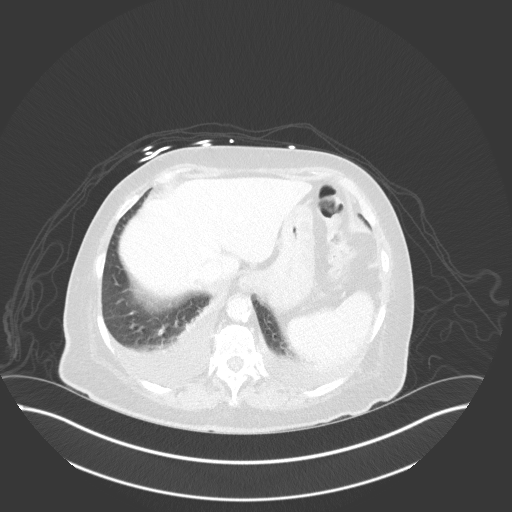
[im 144/198  lung]
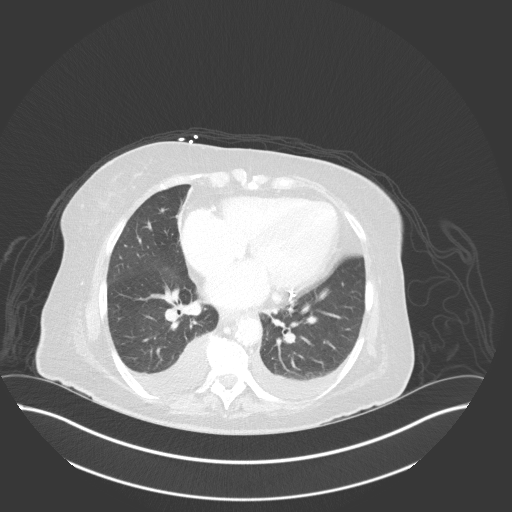
[im 162/198  soft-tissue]
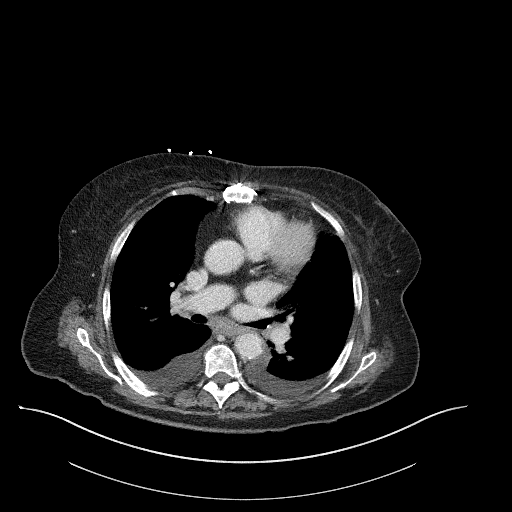
[im 162/198  lung]
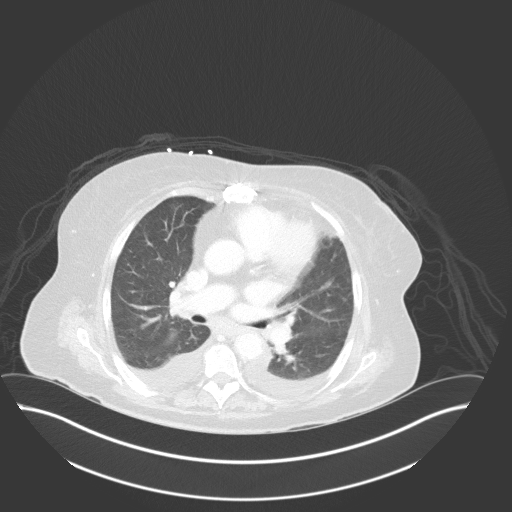
[im 180/198  soft-tissue]
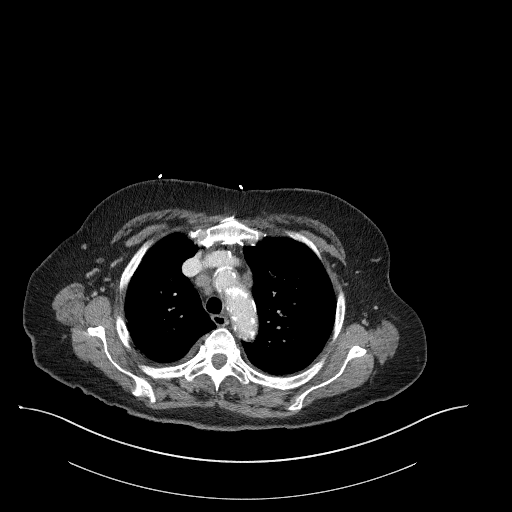
[im 180/198  lung]
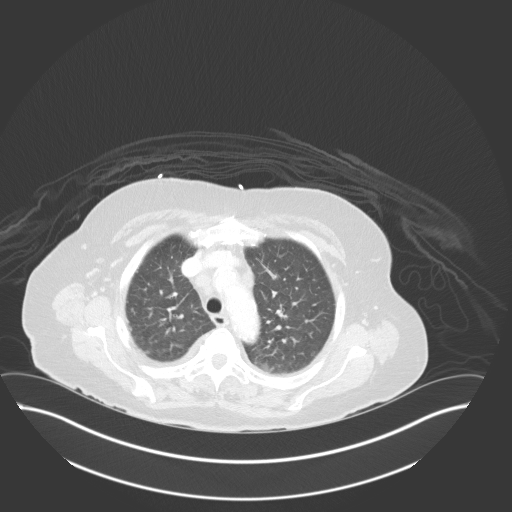

[11 of 46 positions shown; findings below may reference images not displayed]

FINDINGS: CTA CHEST FINDINGS

Cardiovascular: Satisfactory opacification of the pulmonary arteries
to the segmental level. No evidence of pulmonary embolism.

Severe calcific atherosclerosis of the thoracic aorta, no evidence
of dissection or aneurysm.

Stable moderate cardiomegaly. Coronary artery calcifications. Status
post coronary artery bypass graft surgery.

Mediastinum/Nodes: Postsurgical changes related to CABG. Mediastinal
lymphadenopathy is stable for example a right peritracheal lymph
node measuring 14 mm short axis (series 4, image 25). Normal
thoracic esophagus. Right thyroid lobe nodule measuring 11 mm.

Lungs/Pleura: Small bilateral pleural effusions are increased in
size from the prior study. There is septal thickening at the lung
apices consistent with interstitial pulmonary edema.

Musculoskeletal: Postsurgical changes related to median sternotomy
are stable. No acute osseous abnormality is evident.

Review of the MIP images confirms the above findings.

CTA ABDOMEN AND PELVIS FINDINGS

VASCULAR

Aorta: Stable focal dissection of the infrarenal abdominal aorta
just proximal to the bifurcation without propagation into the
internal iliac arteries. Severe calcific atherosclerosis. Stable
occlusion of the right superficial femoral artery.

Celiac: Calcified plaque of the celiac axis origin with moderate 50%
stenosis.

SMA: Patent without evidence of aneurysm, dissection, vasculitis or
significant stenosis.

Renals: Right renal artery occlusion occlusion. Left renal artery
origin stent.

IMA: Patent without evidence of aneurysm, dissection, vasculitis or
significant stenosis.

Veins: No obvious venous abnormality within the limitations of this
arterial phase study.

Review of the MIP images confirms the above findings.

NON-VASCULAR

Hepatobiliary: No focal liver abnormality is seen. No gallstones,
gallbladder wall thickening, or biliary dilatation.

Pancreas: Unremarkable. No pancreatic ductal dilatation or
surrounding inflammatory changes.

Spleen: Normal in size without focal abnormality.

Adrenals/Urinary Tract: Atrophy of the right kidney. Stable large
wedge-shaped region of hypoperfusion of the left kidney compatible
with infarct involving the lower pole and interpolar region. New
small infarct within the superior pole of the left kidney (series
13, image 82). Left kidney upper pole cyst is stable measuring up to
9 mm. Normal adrenal glands. No obstructive uropathy. Normal
bladder.

Stomach/Bowel: Stomach is within normal limits. Appendix appears
normal. No evidence of bowel wall thickening, distention, or
inflammatory changes. Extensive sigmoid diverticulosis without
evidence for diverticulitis.

Lymphatic: No enlarged abdominal or pelvic lymph nodes.

Reproductive: Uterus and bilateral adnexa are unremarkable.

Other: No abdominal wall hernia or abnormality. No abdominopelvic
ascites.

Musculoskeletal: Lumbar degenerative changes are greatest at the
L5-S1 level where there is grade 1 anterolisthesis and chronic
bilateral L5 pars defects.

Review of the MIP images confirms the above findings.
IMPRESSION: 1. No evidence of pulmonary embolus.
2. Stable small focal dissection within the infrarenal abdominal
aorta just proximal to the bifurcation. No evidence for new aortic
dissection or aneurysm. Severe diffuse aortic atherosclerosis.
3. Small bilateral pleural effusions are increased in size.
Interstitial pulmonary edema.
4. Stable mediastinal adenopathy, possibly due to pulmonary
congestion.
5. Stable wedge-shaped hypoperfusion of the left kidney compatible
with a renal infarct.
6. New small infarct within the superior pole of the left kidney.
7. Stable atrophy of the right kidney with occlusion of the right
renal artery.
8. Stable calcific atherosclerosis of celiac artery origin with
moderate 50% stenosis.
9. Stable occlusion of the right superficial femoral artery.

By: Roudish Wachill M.D.

## 2018-06-09 IMAGING — DX DG CHEST 1V PORT
1 series · 1 of 1 positions shown · non-contrast
Comparison: 02/17/2016 chest radiograph.

CLINICAL DATA: 75 y/o F; chest pain, dyspnea, tachycardia, and
epigastric pain.

EXAM:
PORTABLE CHEST 1 VIEW

[chest ap]
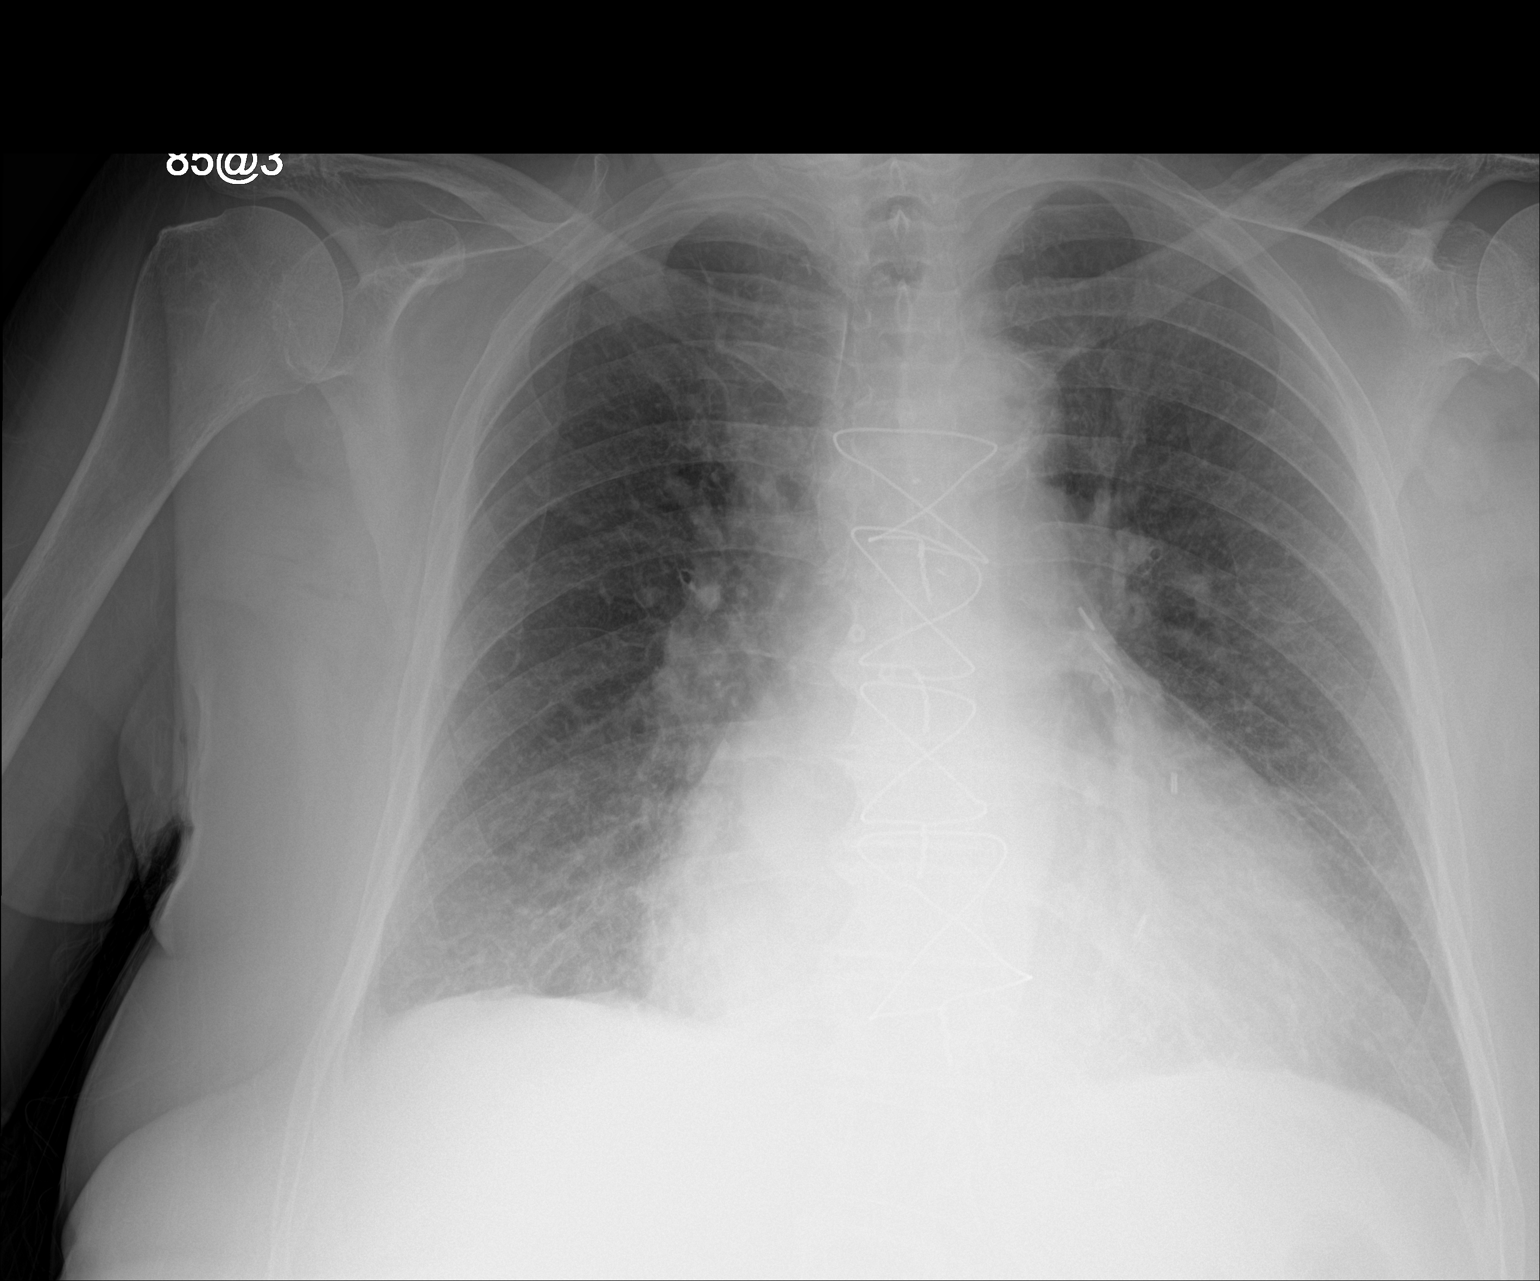

[1 of 1 positions shown; findings below may reference images not displayed]

FINDINGS: Stable moderate cardiomegaly. Status post CABG. Pulmonary venous
hypertension and increased septal markings probably represents
interstitial pulmonary edema. No focal consolidation. No pleural
effusion. No acute osseous abnormality is identified the
IMPRESSION: Pulmonary venous hypertension and probable mild interstitial
pulmonary edema. Moderate cardiomegaly.

By: Lam Hershberger M.D.

## 2018-07-01 IMAGING — DX DG CHEST 1V PORT
1 series · 1 of 1 positions shown · non-contrast
Comparison: CT chest and single view of the chest 03/27/2016.

CLINICAL DATA: Chest pain beginning this morning.

EXAM:
PORTABLE CHEST 1 VIEW

[chest ap]
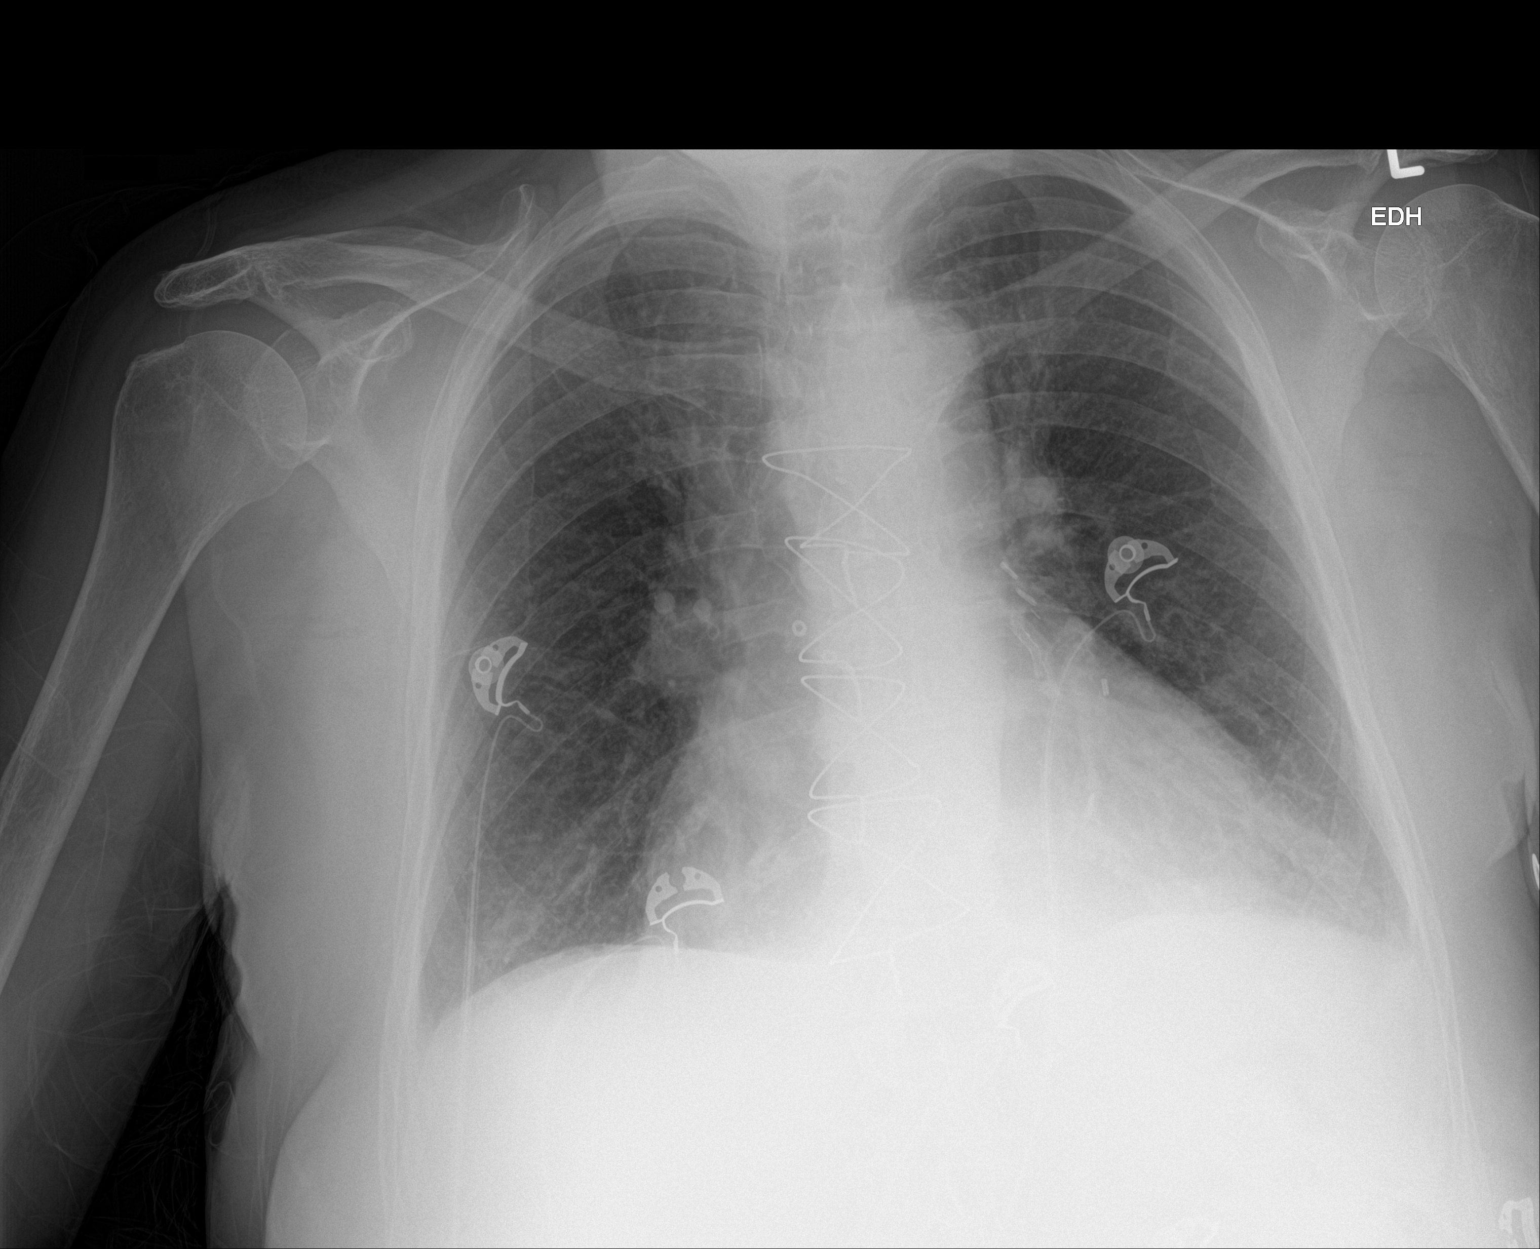

[1 of 1 positions shown; findings below may reference images not displayed]

FINDINGS: There is cardiomegaly without edema. The patient is status post
CABG. No pneumothorax. Mild left basilar atelectasis is seen.
IMPRESSION: Cardiomegaly without edema.  No acute abnormality.

## 2018-07-15 IMAGING — CR DG CHEST 2V
1 series · 2 of 2 positions shown · non-contrast
Comparison: April 18, 2016

CLINICAL DATA: Chest pain

EXAM:
CHEST  2 VIEW

[Series 1: dg chest 2 view · 0.14mm/px · 2 of 2 slices shown]
[im 1/2]
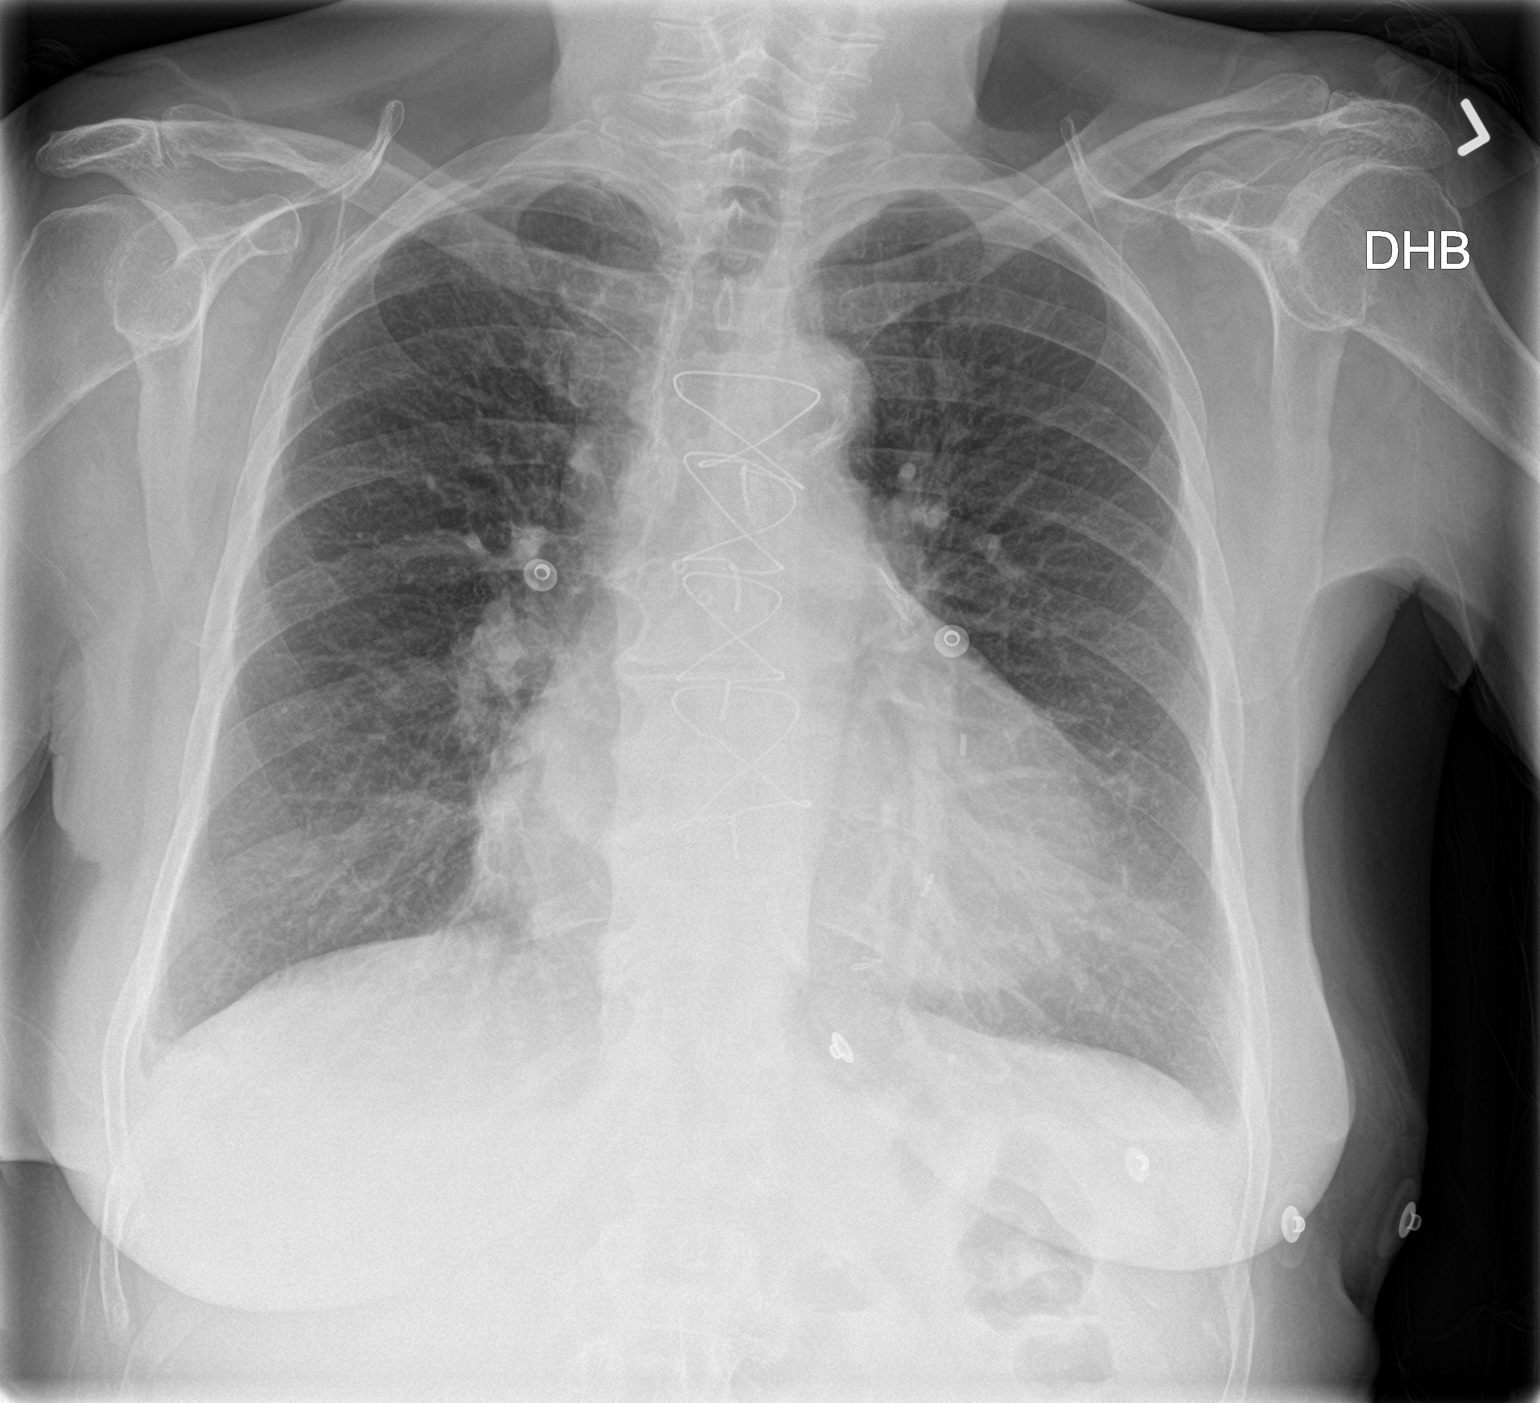
[im 2/2]
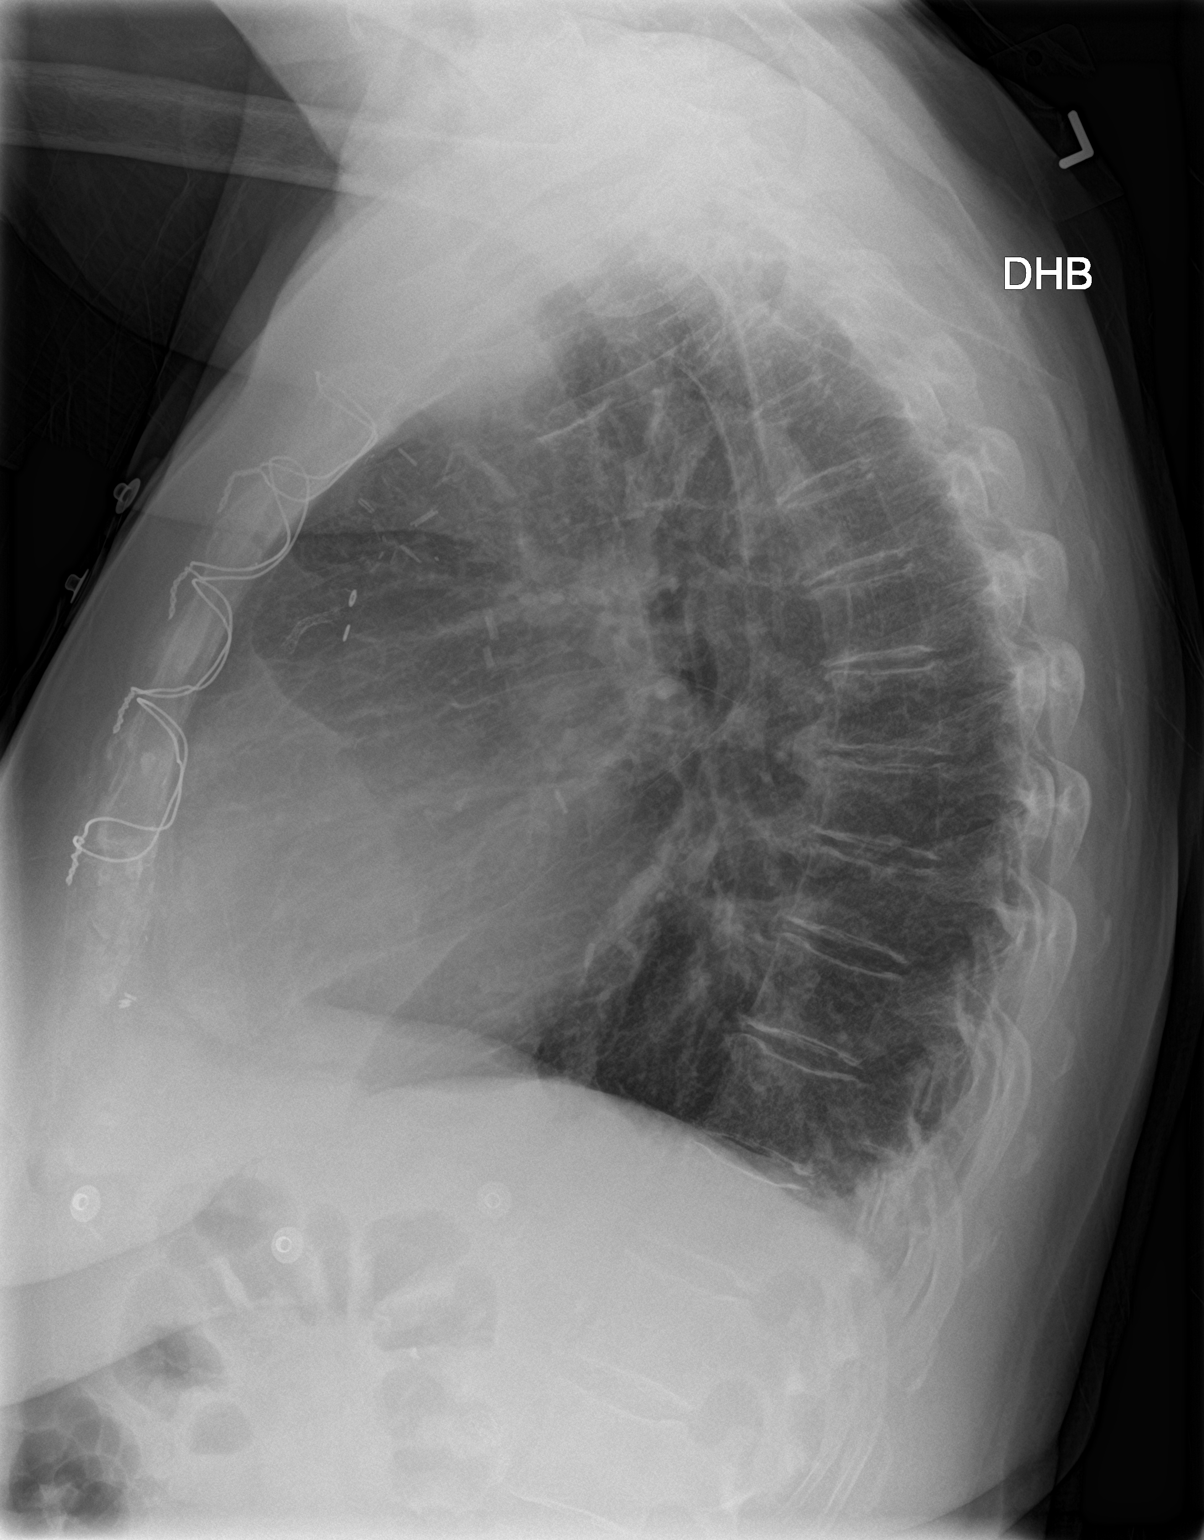

[2 of 2 positions shown; findings below may reference images not displayed]

FINDINGS: There is no edema or consolidation. Heart is mildly enlarged with
pulmonary vascularity within normal limits. No adenopathy. Patient
is status post coronary artery bypass grafting. No adenopathy. There
is atherosclerotic calcification aorta. No bone lesions.
IMPRESSION: No edema or consolidation. Stable cardiomegaly. There is aortic
atherosclerosis.

## 2018-07-18 IMAGING — CR DG CHEST 2V
2 series · 2 of 2 positions shown · non-contrast
Comparison: 05/02/2016

CLINICAL DATA: Short of breath

EXAM:
CHEST  2 VIEW

[chest pa]
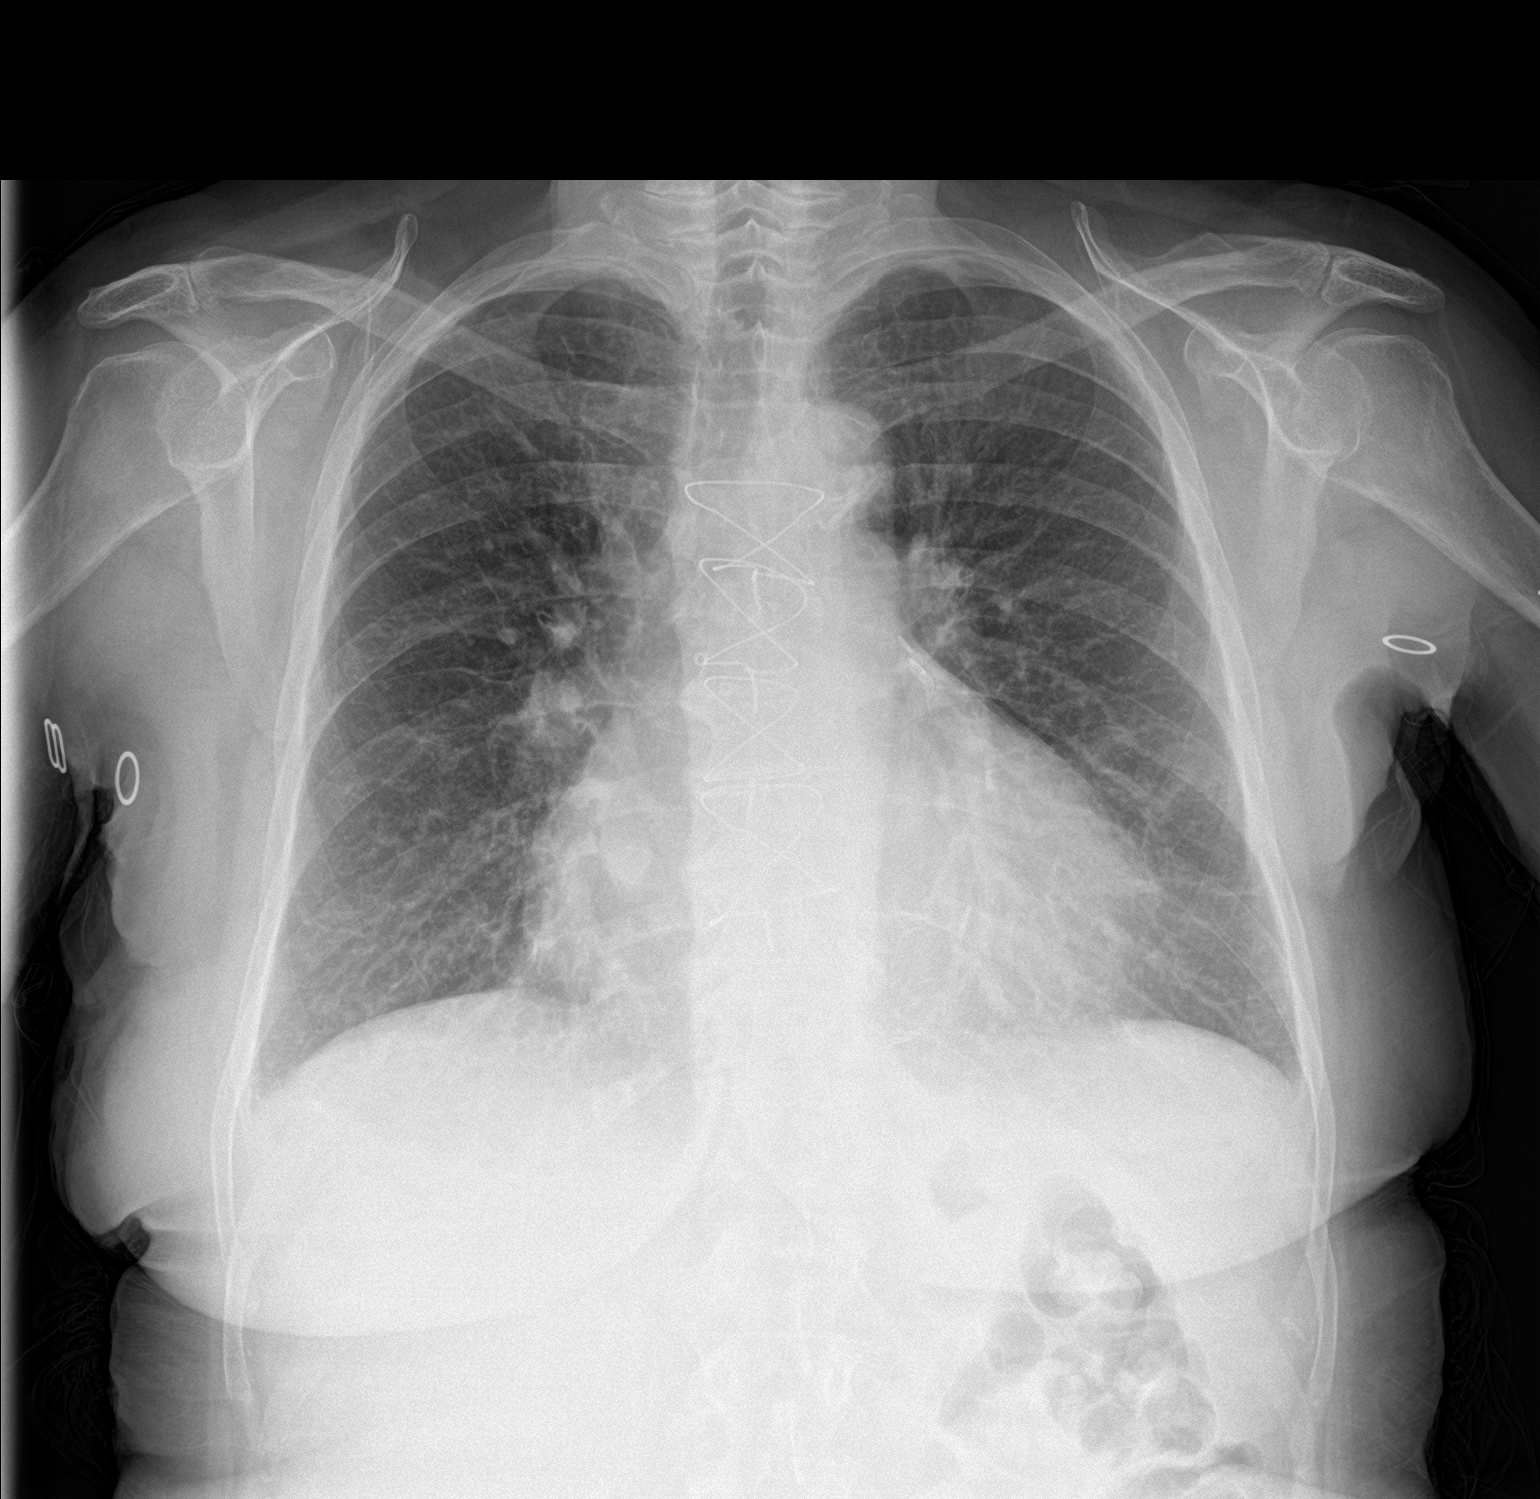

[chest lat]
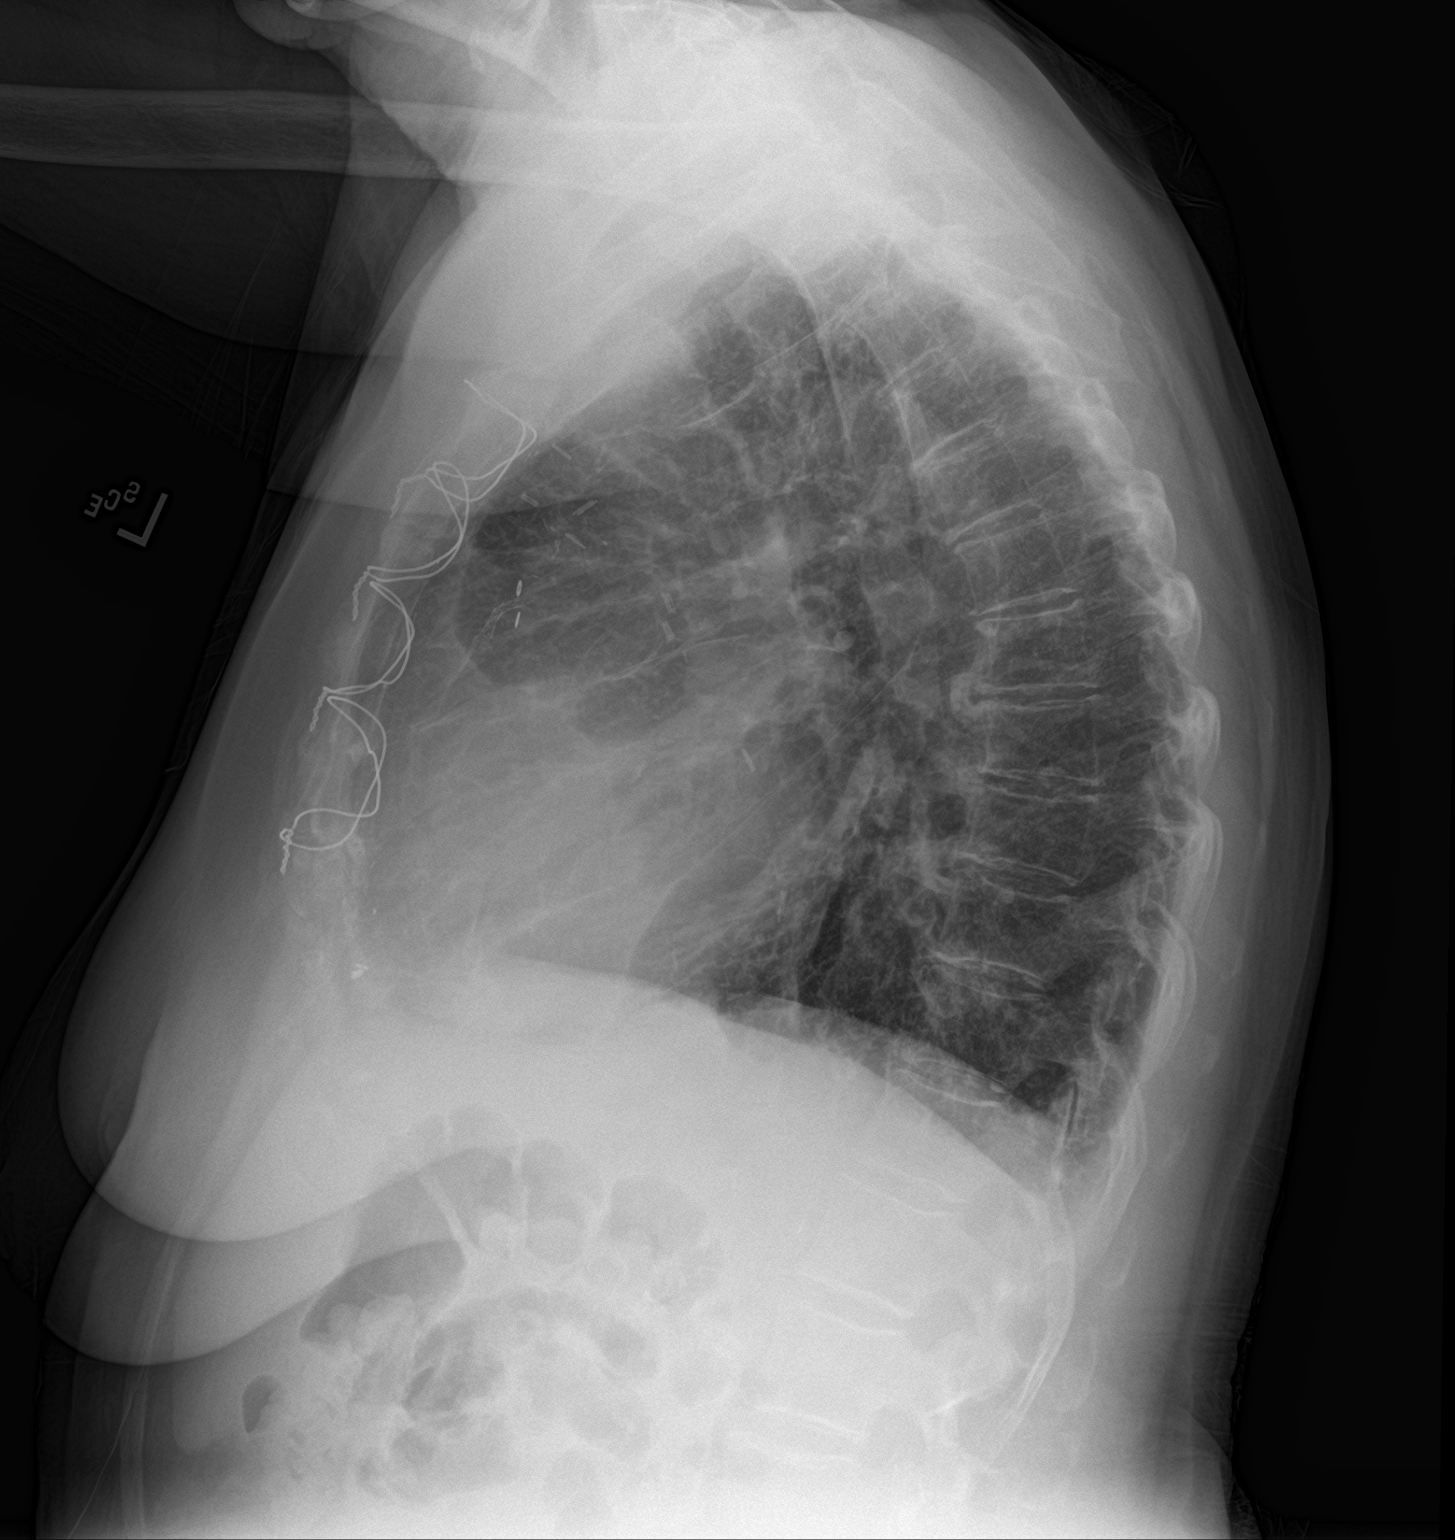

[2 of 2 positions shown; findings below may reference images not displayed]

FINDINGS: CABG. Cardiac enlargement with mild vascular congestion. Negative
for edema or effusion. Negative for pneumonia. Mild bibasilar
atelectasis
IMPRESSION: Cardiac enlargement.  Mild vascular congestion without edema

Mild bibasilar atelectasis.

## 2018-08-15 IMAGING — CR DG CHEST 2V
2 series · 2 of 2 positions shown · non-contrast
Comparison: May 05, 2016

CLINICAL DATA: Chest pain for 3 days

EXAM:
CHEST  2 VIEW

[chest pa]
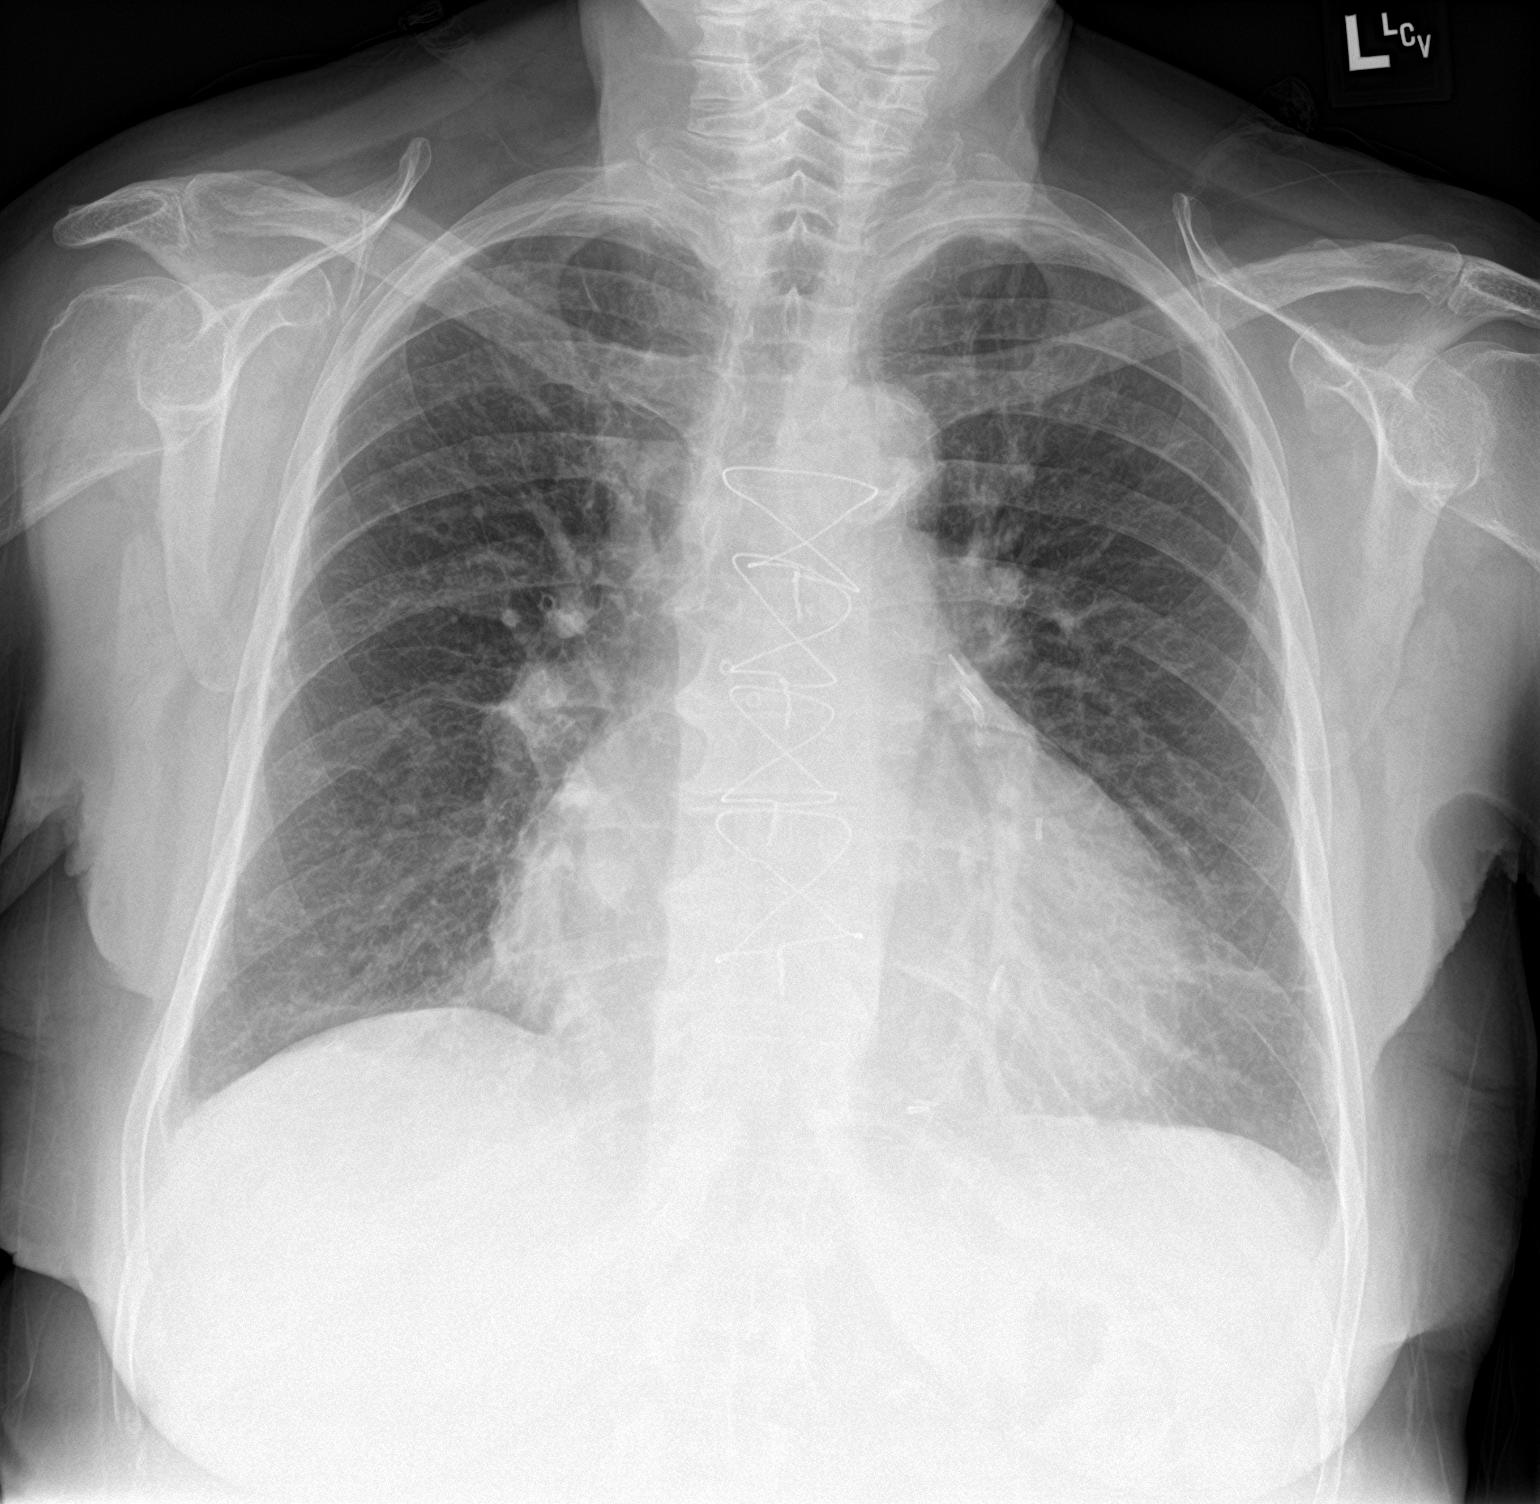

[chest lat]
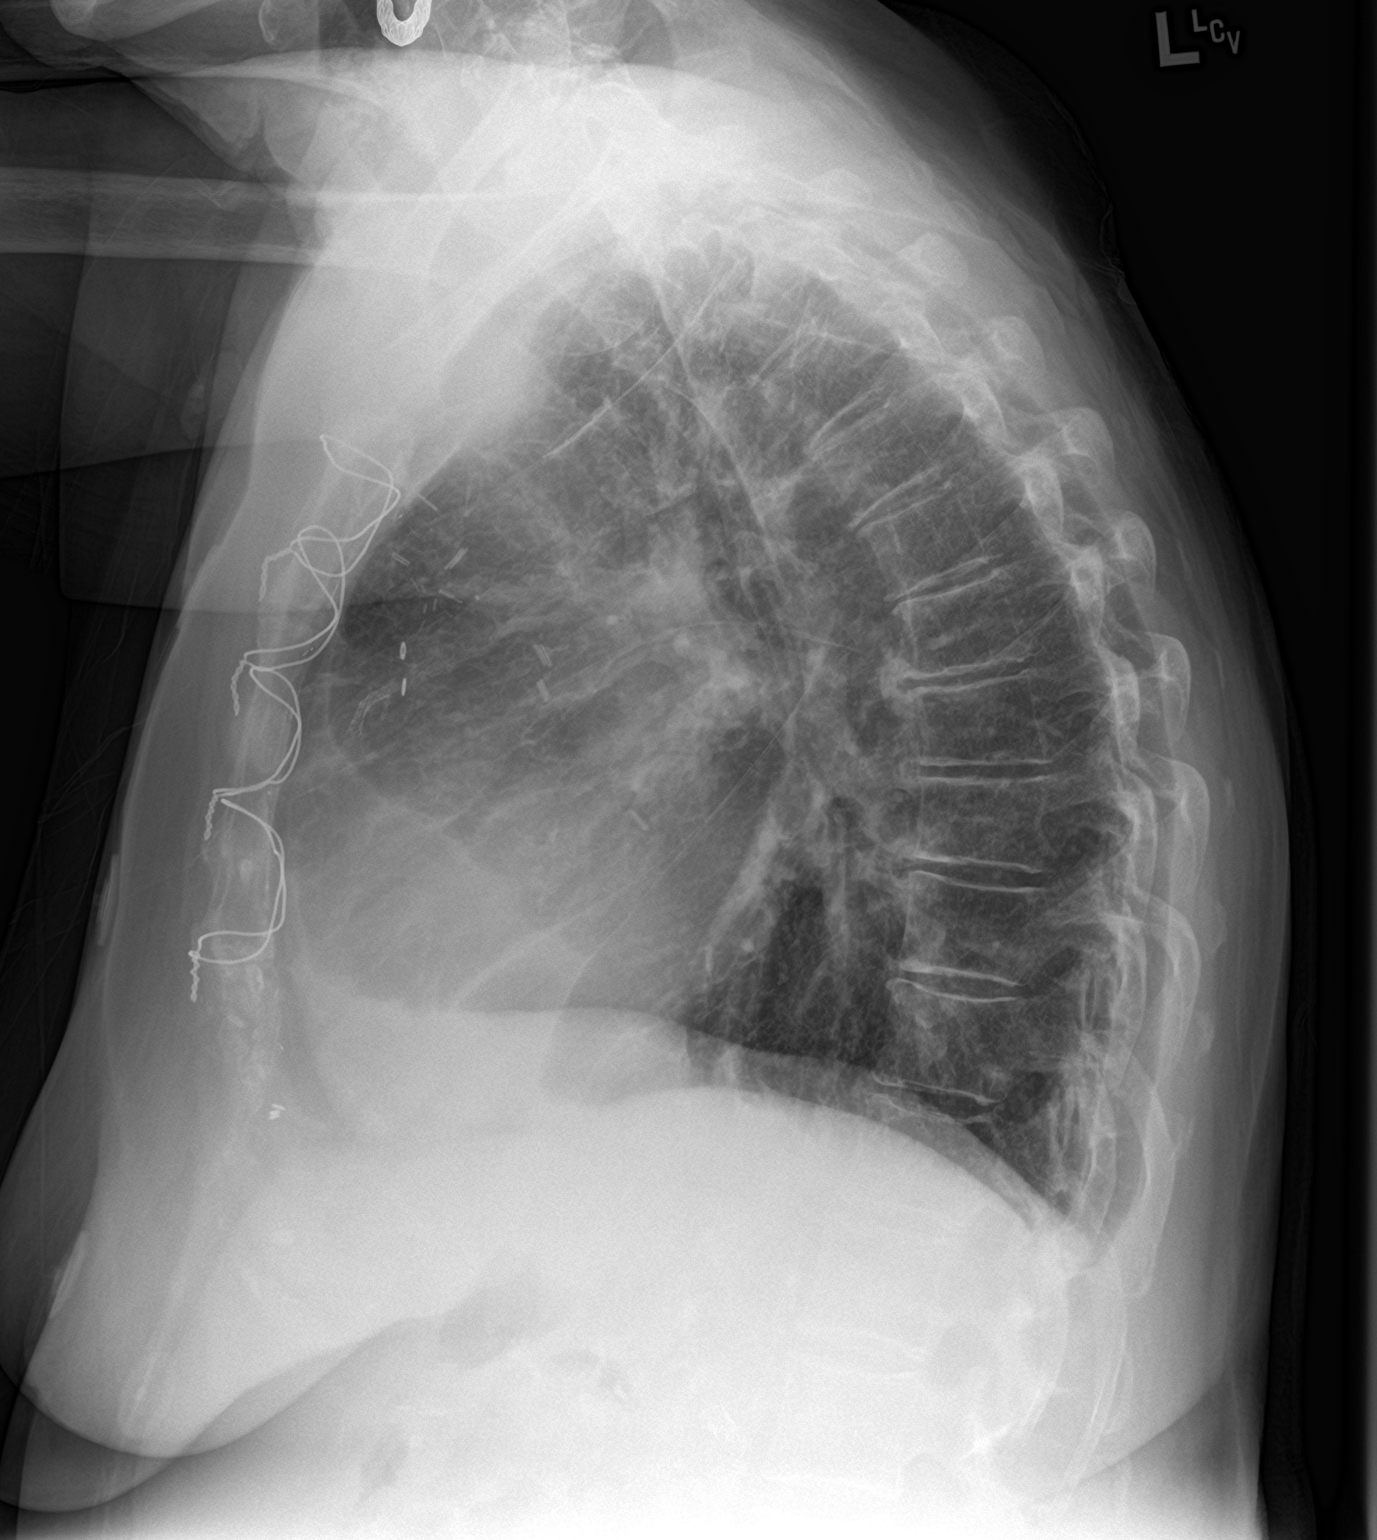

[2 of 2 positions shown; findings below may reference images not displayed]

FINDINGS: There is atelectatic change in the left base. Lungs elsewhere clear.
Heart is mildly enlarged with pulmonary vascularity within normal
limits. No adenopathy. Patient is status post coronary artery bypass
grafting. There is aortic atherosclerosis. Bones are somewhat
osteoporotic.
IMPRESSION: No edema or consolidation. Left base atelectasis. Mild cardiomegaly.
There is aortic atherosclerosis.

## 2018-08-21 IMAGING — NM NM HEPATO W/GB/PHARM/[PERSON_NAME]
1 series · 6 of 6 positions shown · non-contrast
Comparison: Ultrasound abdomen 06/07/2016, CT abdomen 06/05/2016

CLINICAL DATA: Abdominal pain 5 days.  Nausea, vomiting, diarrhea.

Patient refused to complete the entire examination. The patient
refused further evaluation at 46 minutes of imaging. The patient did
not Ensure nor was a gallbladder ejection fraction performed.
EXAM:
NUCLEAR MEDICINE HEPATOBILIARY IMAGING WITH GALLBLADDER EF
TECHNIQUE: Sequential images of the abdomen were obtained [DATE] minutes
following intravenous administration of radiopharmaceutical. After
oral ingestion of Ensure, gallbladder ejection fraction was
determined. At 60 min, normal ejection fraction is greater than 33%.
RADIOPHARMACEUTICALS:  5.24 mCi [id number]  Choletec IV

[Series 1000: hepatobiliary scan · 9.59mm/px · 6 of 60 frames shown]
[frame 6/60]
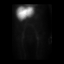
[frame 16/60]
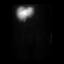
[frame 26/60]
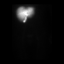
[frame 36/60]
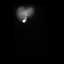
[frame 46/60]
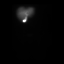
[frame 56/60]
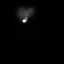

[6 of 6 positions shown; findings below may reference images not displayed]

FINDINGS: Prompt uptake and biliary excretion of activity by the liver is
seen. Gallbladder activity is visualized, consistent with patency of
cystic duct.

Patient refused imaging after 46 minutes of initial imaging. No
activity is identified in the small bowel at the time of refusal.
The patency of the common bile duct cannot be evaluated.
IMPRESSION: 1. No scintigraphic evidence of acute cholecystitis. Patent cystic
duct.
2. Patient refused imaging after 46 minutes of initial imaging. No
activity is identified in the small bowel at the time of refusal.
The patency of the common bile duct cannot be evaluated.

## 2018-08-23 IMAGING — DX DG CHEST 1V
1 series · 1 of 1 positions shown · non-contrast
Comparison: Chest radiograph performed 06/02/2016

CLINICAL DATA: Acute onset of central chest pain, radiating to both
arms. Nausea, vomiting and shortness of breath. Initial encounter.

EXAM:
CHEST 1 VIEW

[chest ap]
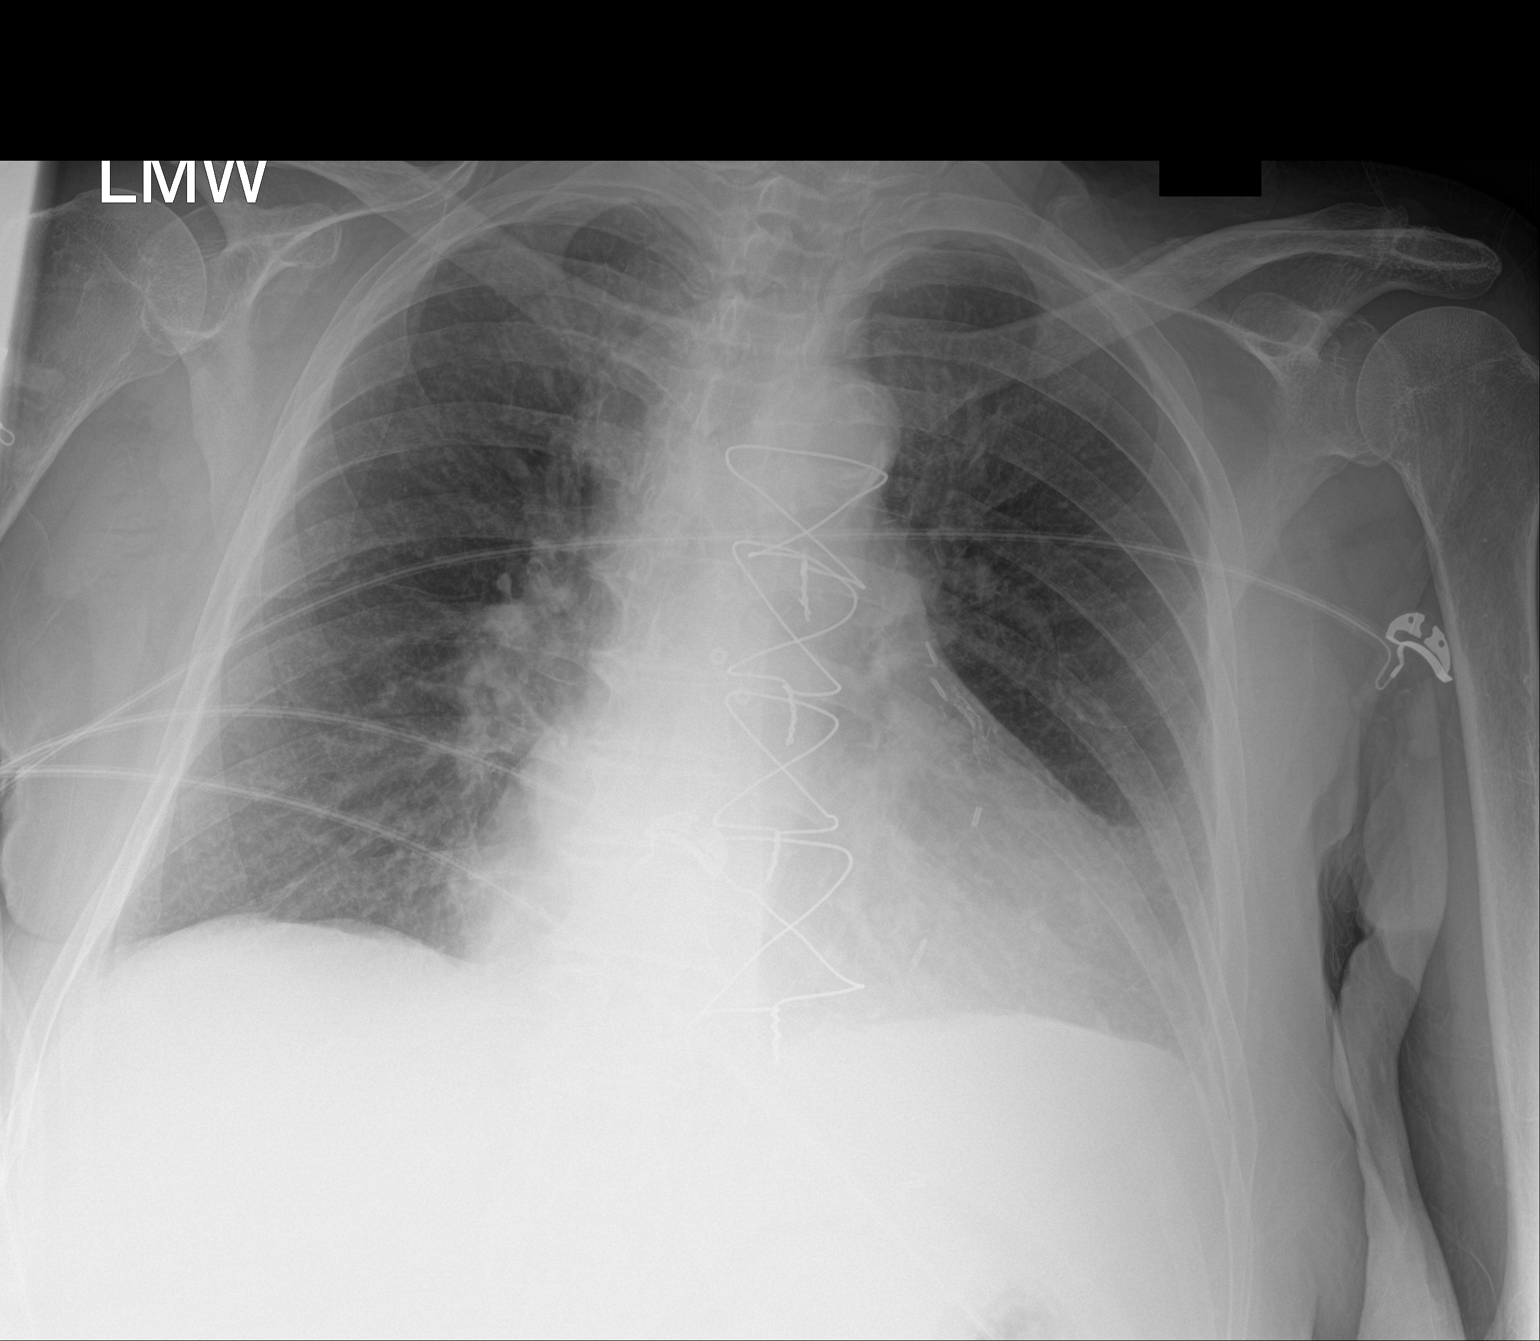

[1 of 1 positions shown; findings below may reference images not displayed]

FINDINGS: The lungs are well-aerated and clear. There is no evidence of focal
opacification, pleural effusion or pneumothorax.

The cardiomediastinal silhouette is mildly enlarged. The patient is
status post median sternotomy. No acute osseous abnormalities are
seen.
IMPRESSION: Mild cardiomegaly.  Lungs remain grossly clear.

## 2018-08-25 IMAGING — CR DG CHEST 2V
2 series · 2 of 2 positions shown · non-contrast
Comparison: June 10, 2016

CLINICAL DATA: Lower extremity edema

EXAM:
CHEST  2 VIEW

[chest pa]
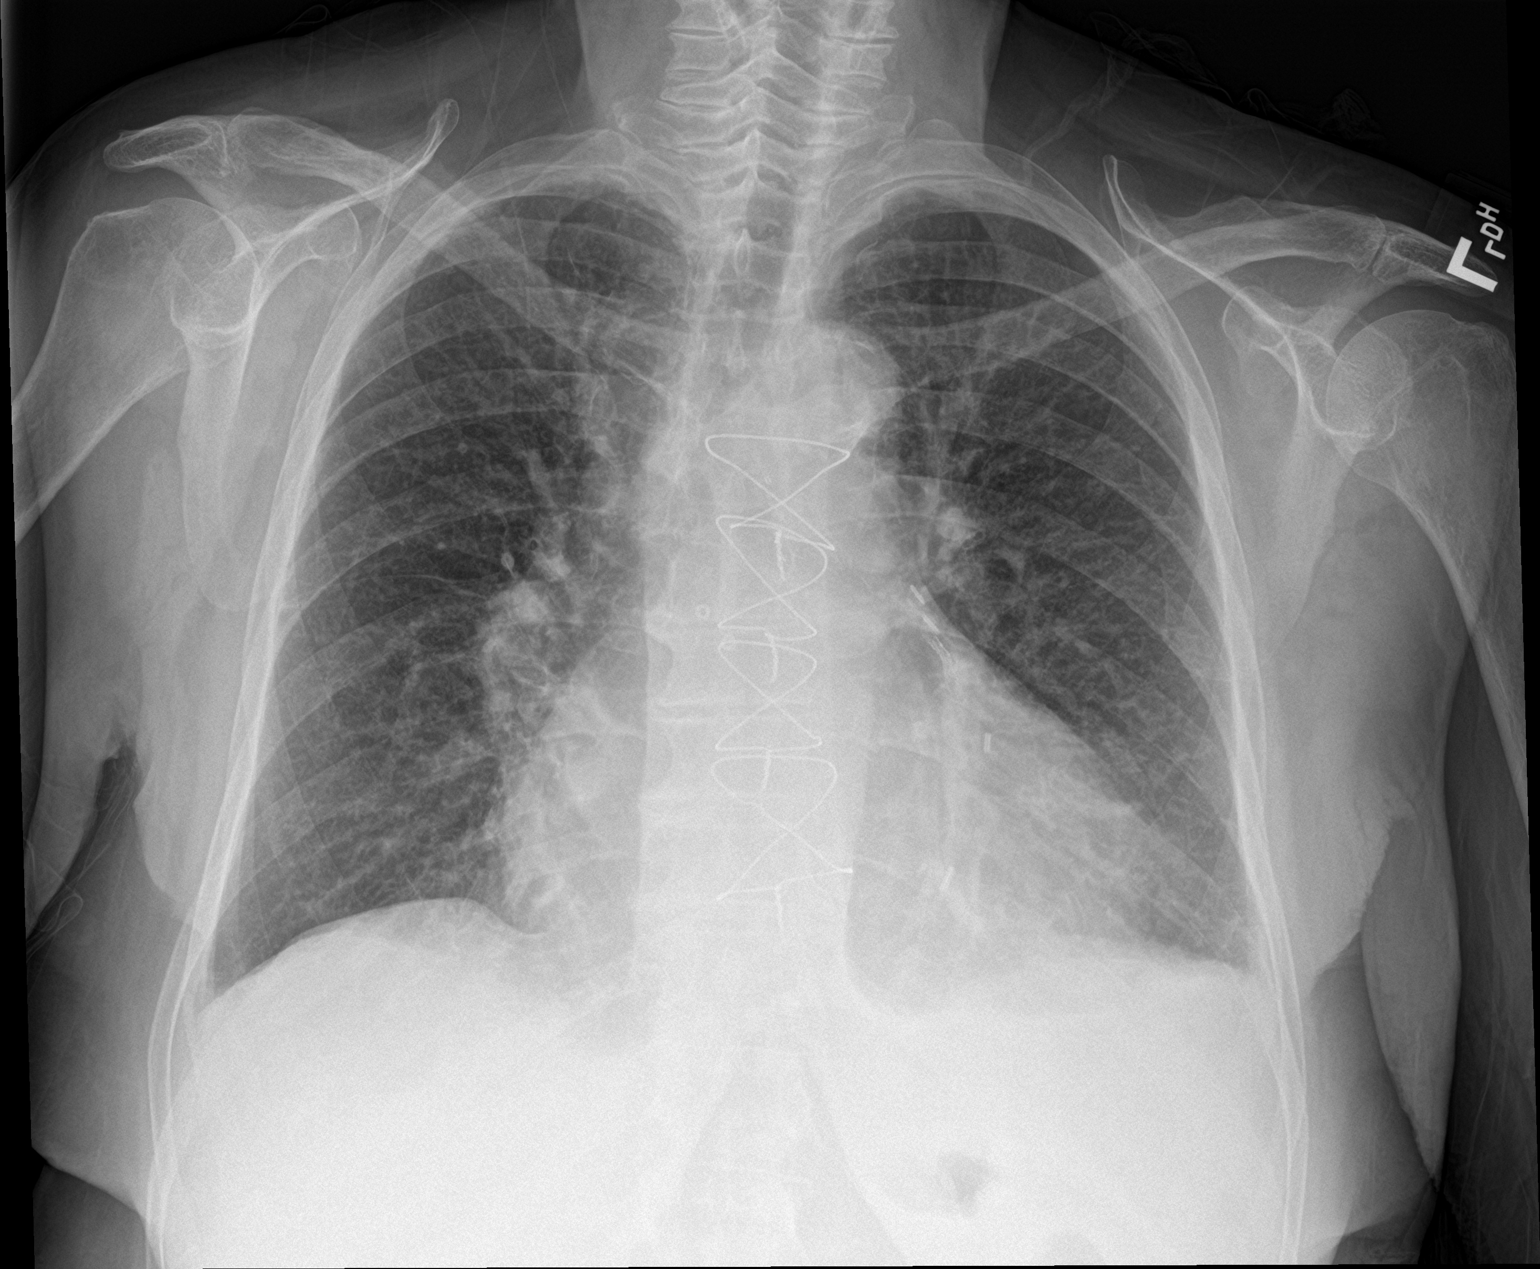

[chest lat]
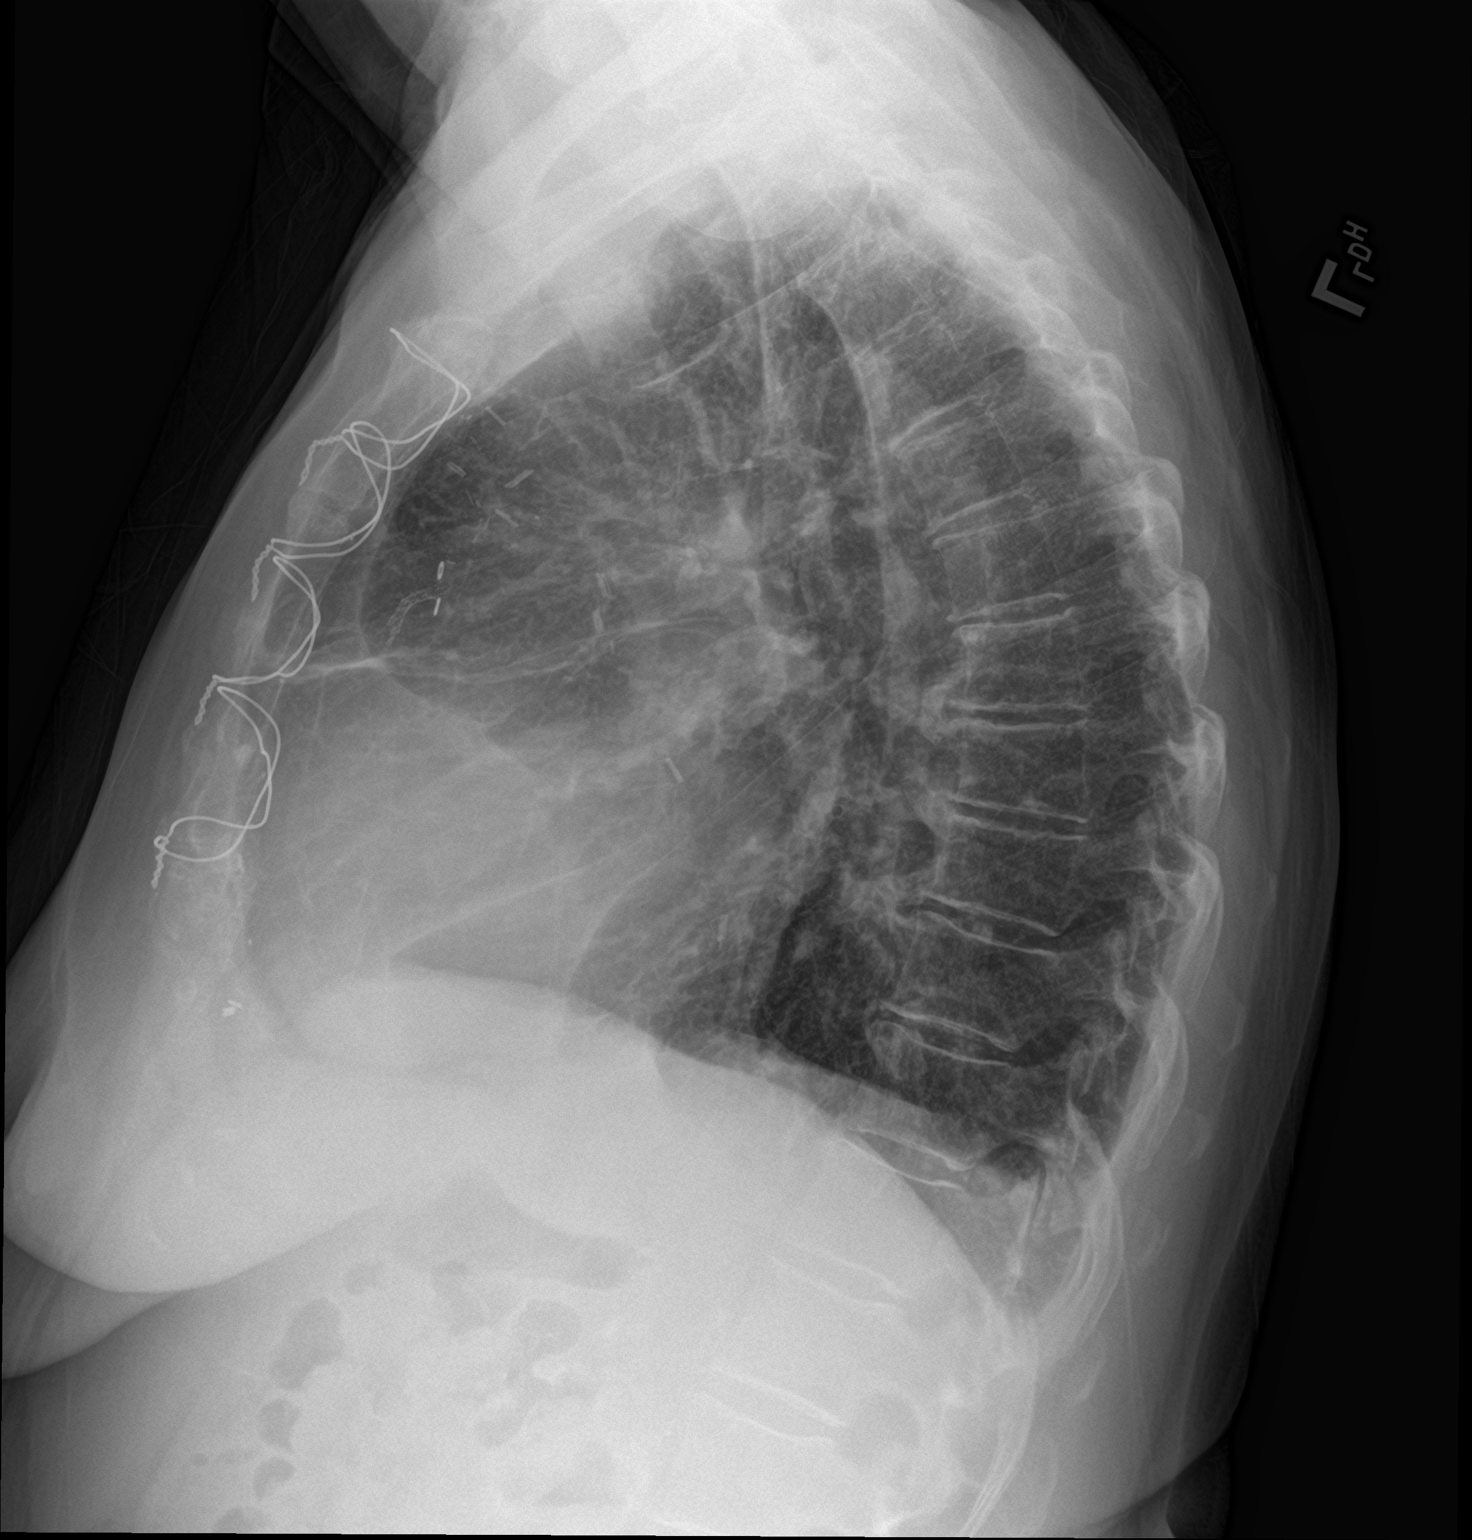

[2 of 2 positions shown; findings below may reference images not displayed]

FINDINGS: There is no edema or consolidation. There is mild cardiomegaly with
pulmonary vascularity within normal limits. Patient is status post
coronary artery bypass grafting. No adenopathy. There is aortic
atherosclerosis. There is mild degenerative change in the thoracic
spine.
IMPRESSION: No edema or consolidation. Mild cardiomegaly. Status post coronary
artery bypass grafting. There is aortic atherosclerosis.

## 2018-10-08 IMAGING — CR DG CHEST 2V
1 series · 2 of 2 positions shown · non-contrast
Comparison: 06/12/2016

CLINICAL DATA: Chest pain and shortness of breath beginning this
morning. Wheezing.

EXAM:
CHEST  2 VIEW

[Series 1: dg chest 2 view · 0.14mm/px · 2 of 2 slices shown]
[im 1/2]
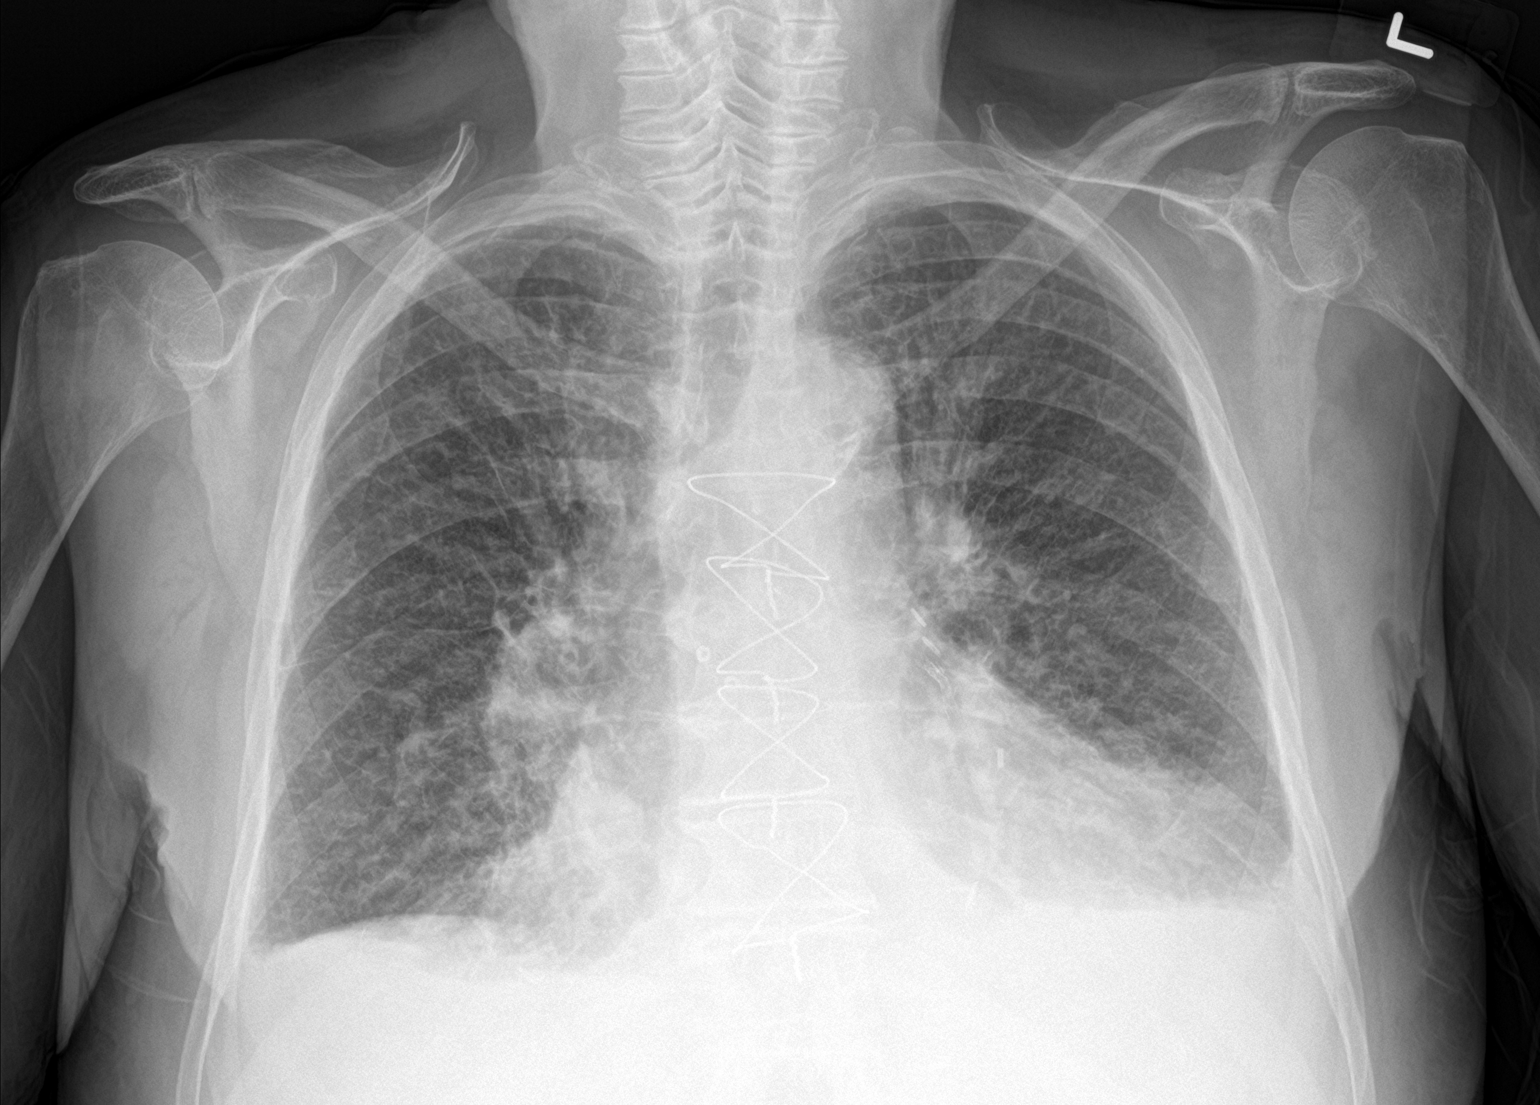
[im 2/2]
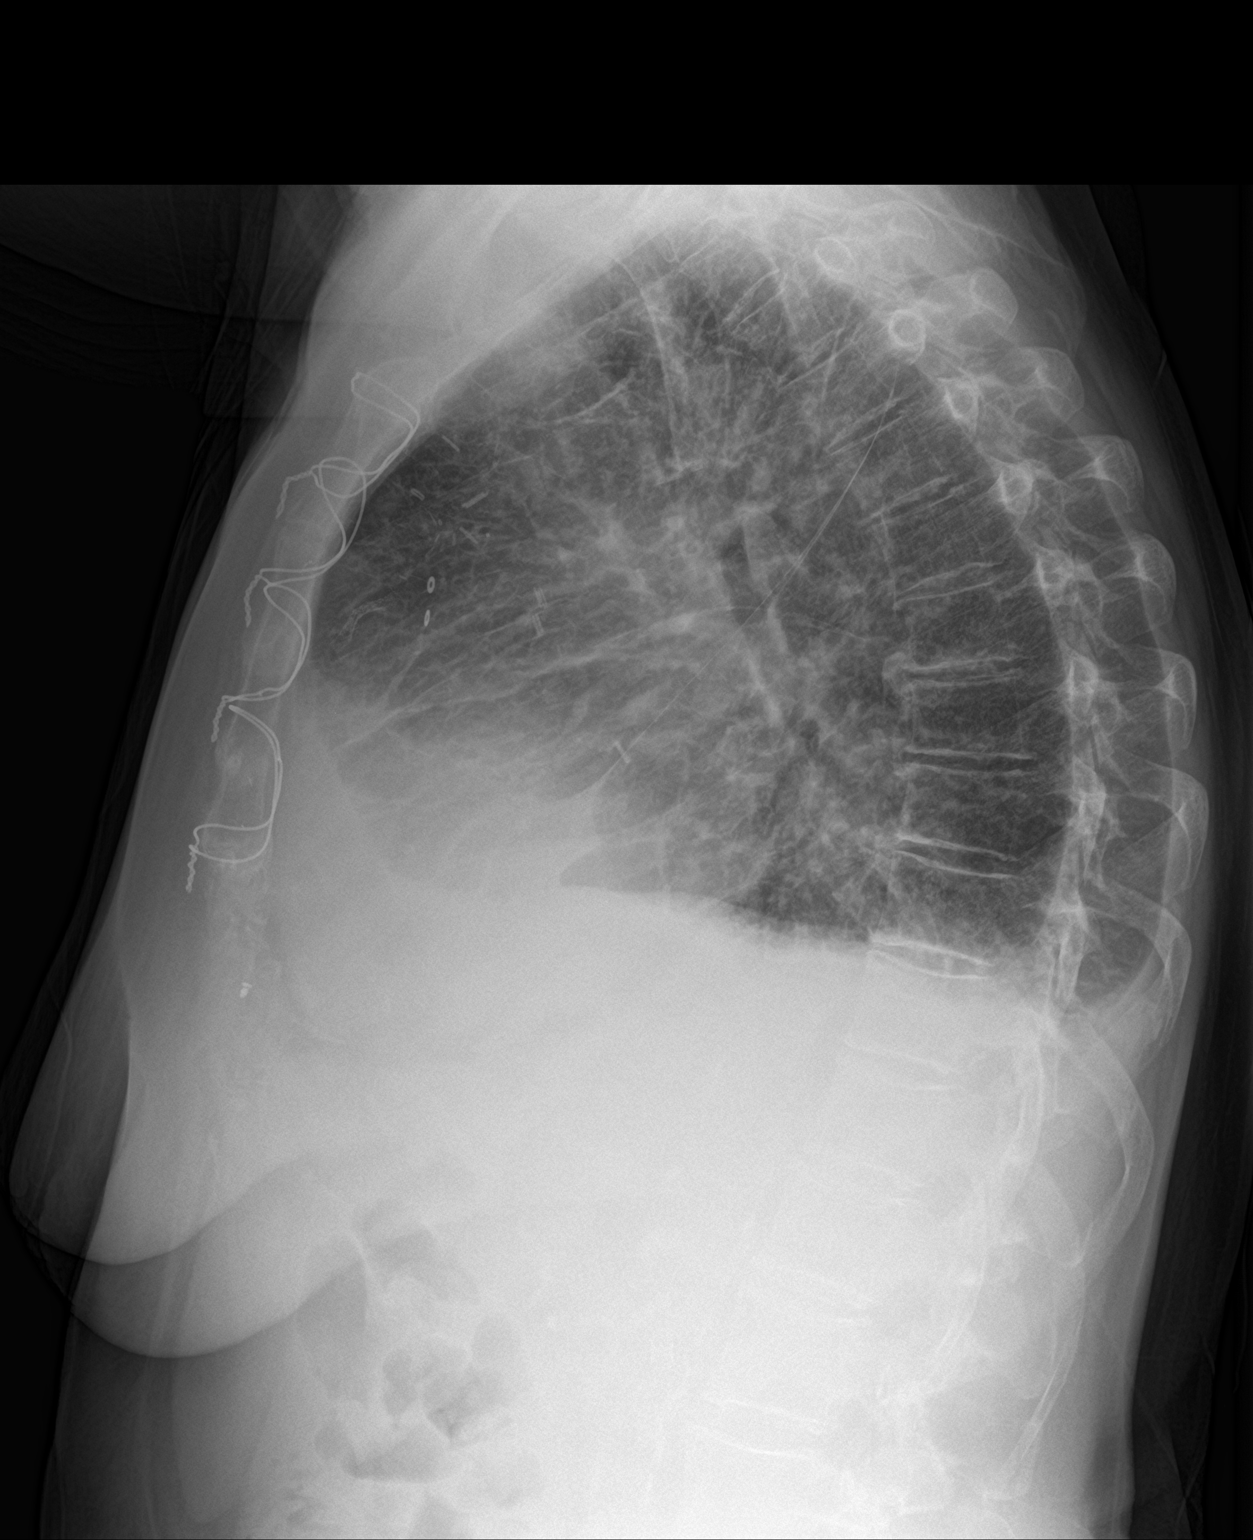

[2 of 2 positions shown; findings below may reference images not displayed]

FINDINGS: Cardiomegaly remains stable. Diffuse interstitial infiltrates have
increased since previous study, likely due to pulmonary edema. Tiny
bilateral pleural effusions seen. No evidence of pulmonary
consolidation. Prior CABG again noted
IMPRESSION: Mild congestive heart failure and tiny bilateral pleural effusions.

## 2018-10-13 IMAGING — CR DG CHEST 2V
2 series · 2 of 2 positions shown · non-contrast
Comparison: Radiographs July 26, 2016.

CLINICAL DATA: Congestive heart failure.

EXAM:
CHEST  2 VIEW

[chest lat]
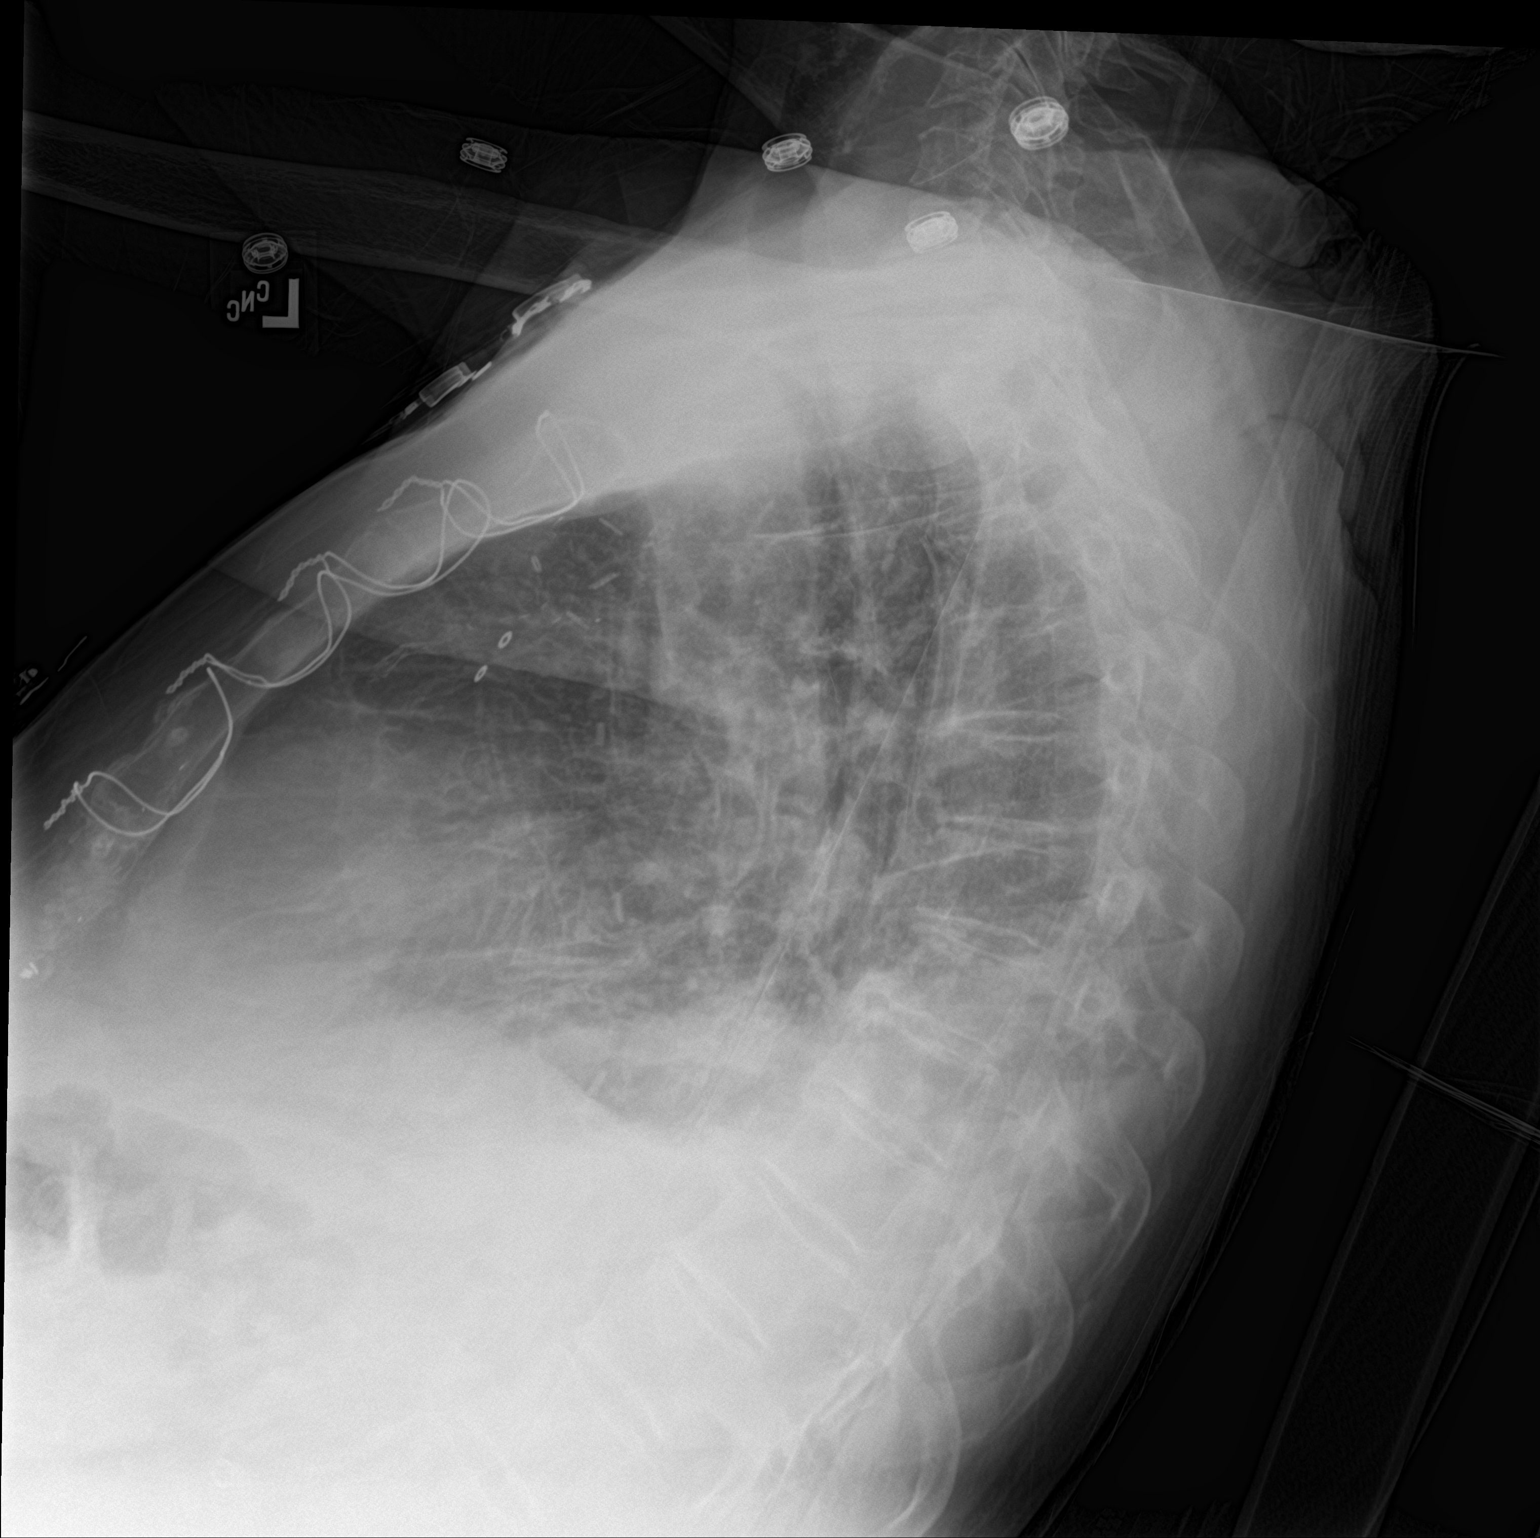

[chest ap]
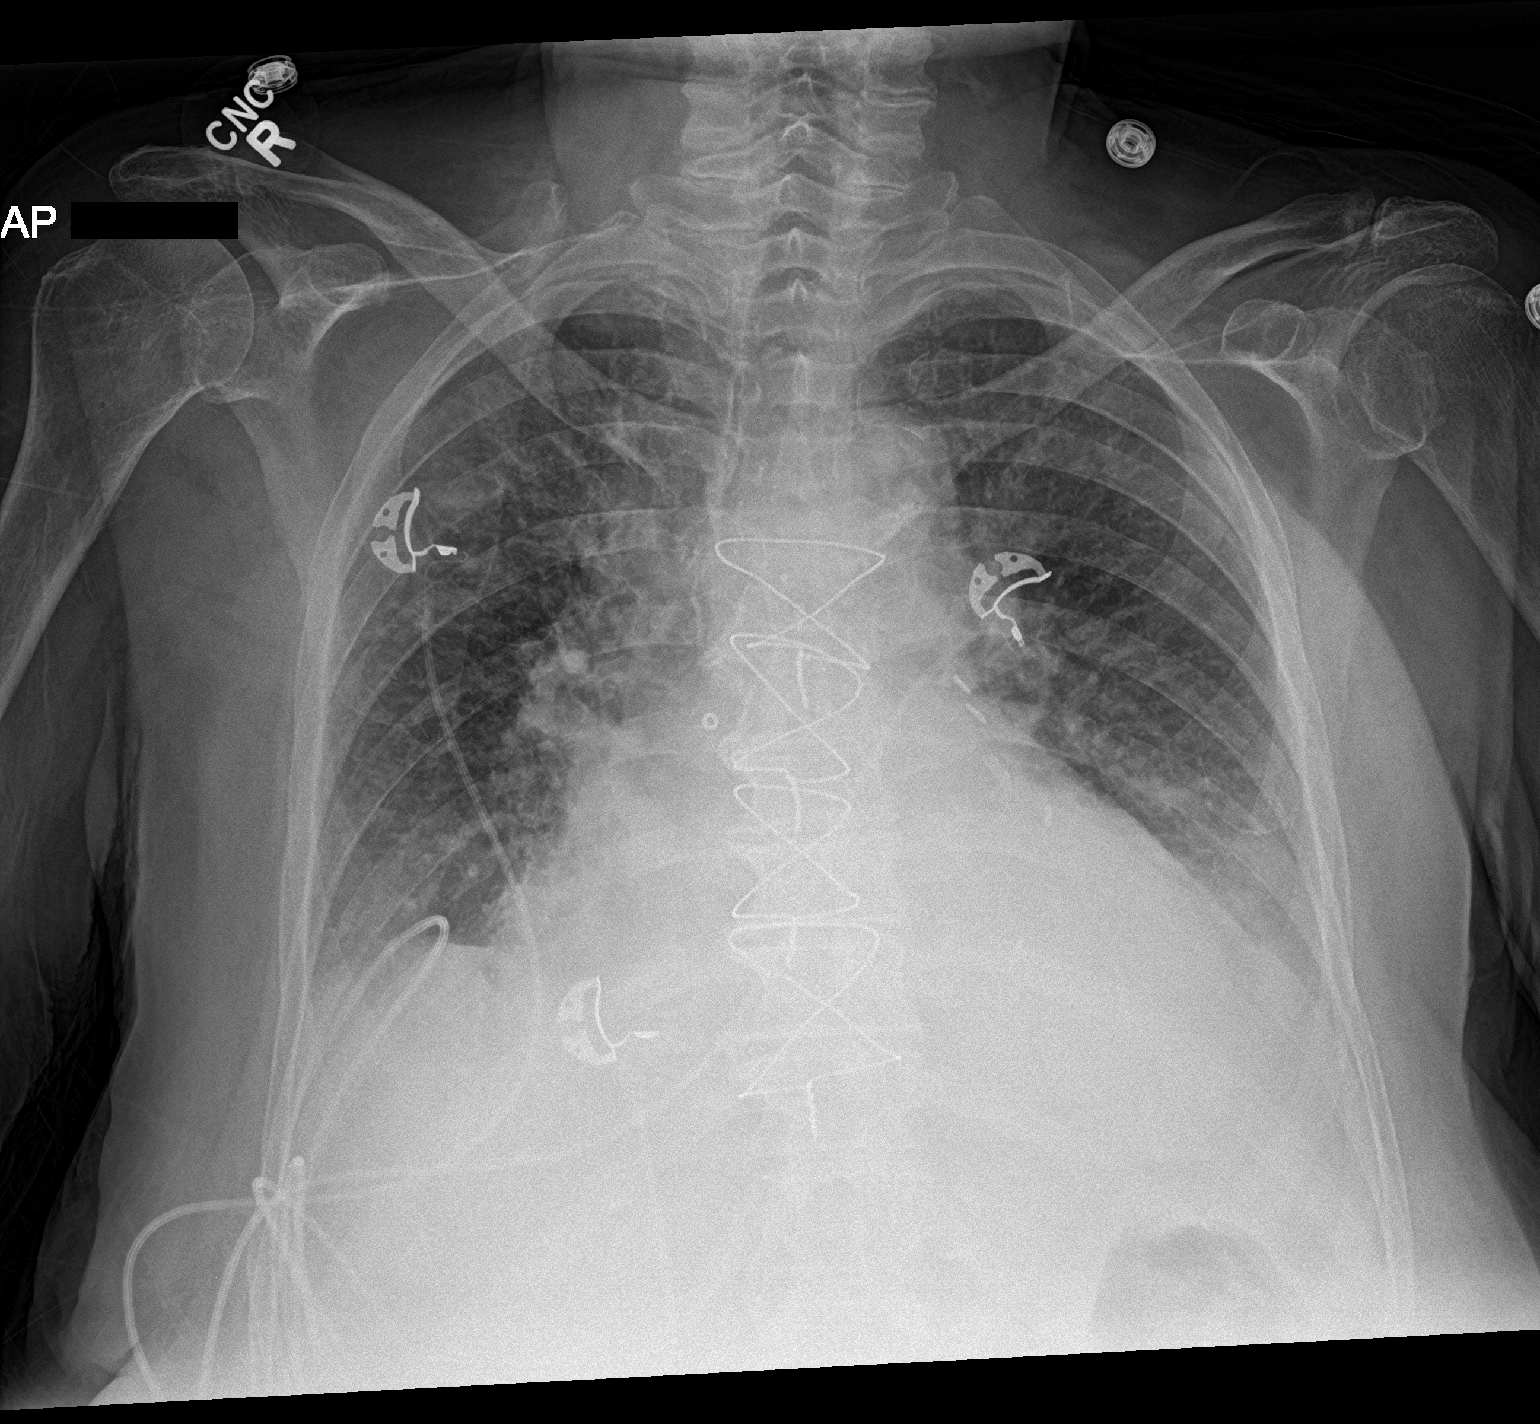

[2 of 2 positions shown; findings below may reference images not displayed]

FINDINGS: Stable cardiomegaly and central pulmonary vascular congestion is
noted. Atherosclerosis of thoracic aorta is noted. No pneumothorax
is noted. Stable bilateral pulmonary edema is noted. Mild bilateral
pleural effusions are noted which are increased compared to prior
exam. Bony thorax is unremarkable.
IMPRESSION: Aortic atherosclerosis. Stable cardiomegaly and central pulmonary
vascular congestion. Stable bilateral pulmonary edema is noted. Mild
bilateral pleural effusions are noted which are increased compared
to prior exam.

## 2019-01-01 IMAGING — DX DG CHEST 1V PORT
1 series · 1 of 1 positions shown · non-contrast
Comparison: 07/31/2016 and earlier.

CLINICAL DATA: 76-year-old female status post fall with
bradycardia.

EXAM:
PORTABLE CHEST 1 VIEW

[chest ap]
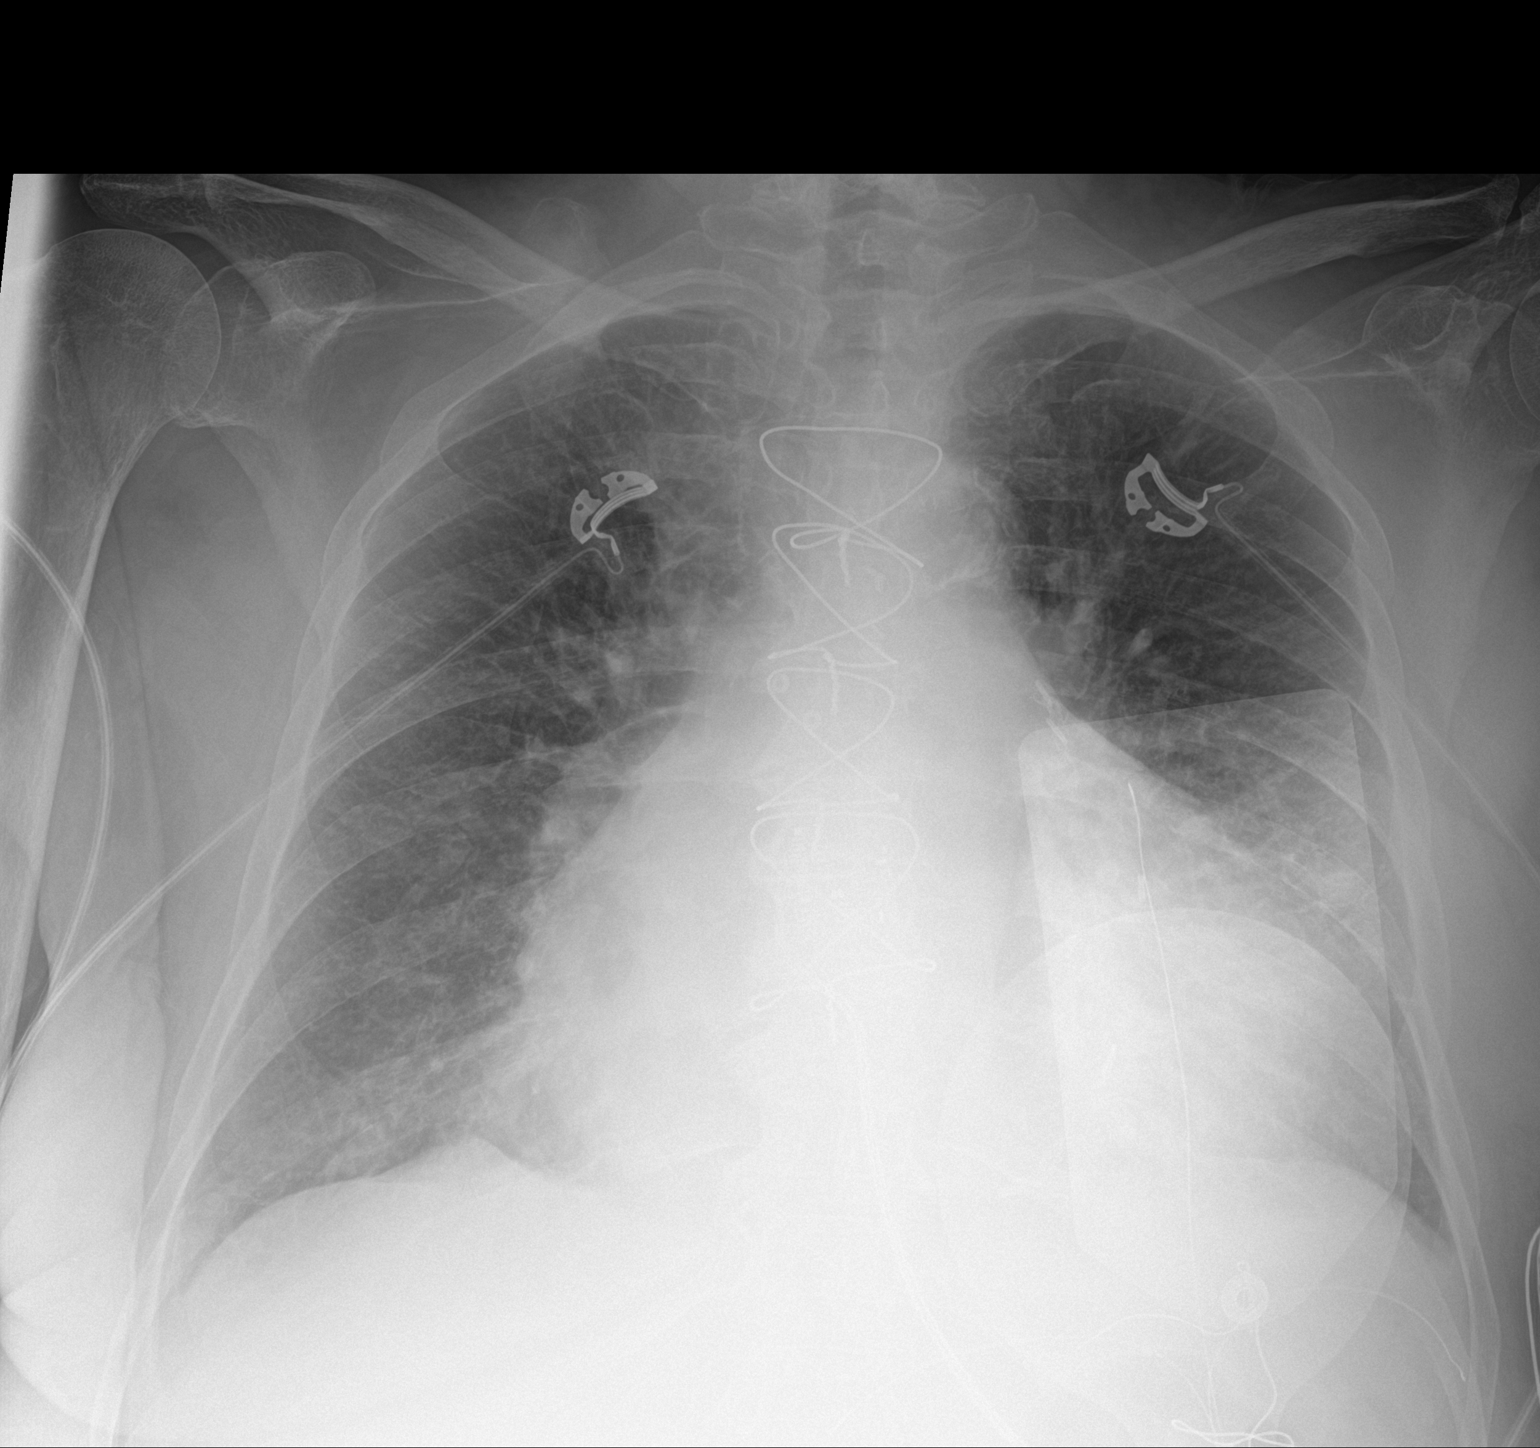

[1 of 1 positions shown; findings below may reference images not displayed]

FINDINGS: Portable AP semi upright view at [id number] hours. Previous CABG.
Resuscitation or pacer pads project over the left chest. Moderate to
severe cardiomegaly. Mediastinal contours are stable. Pulmonary
vascularity has decreased compared to May. No overt edema. Improved
bibasilar ventilation, previous small pleural effusions appear
resolved. No pneumothorax, pleural effusion or confluent pulmonary
opacity.
IMPRESSION: 1. Stable moderate to severe cardiomegaly.
2. Resolved pulmonary edema and small pleural effusions since
07/31/2016. No acute cardiopulmonary abnormality.

## 2019-01-06 IMAGING — DX DG CHEST 1V
1 series · 1 of 1 positions shown · non-contrast
Comparison: Chest x-ray dated October 19, 2016.

CLINICAL DATA: Dyspnea.

EXAM:
CHEST 1 VIEW

[chest ap]
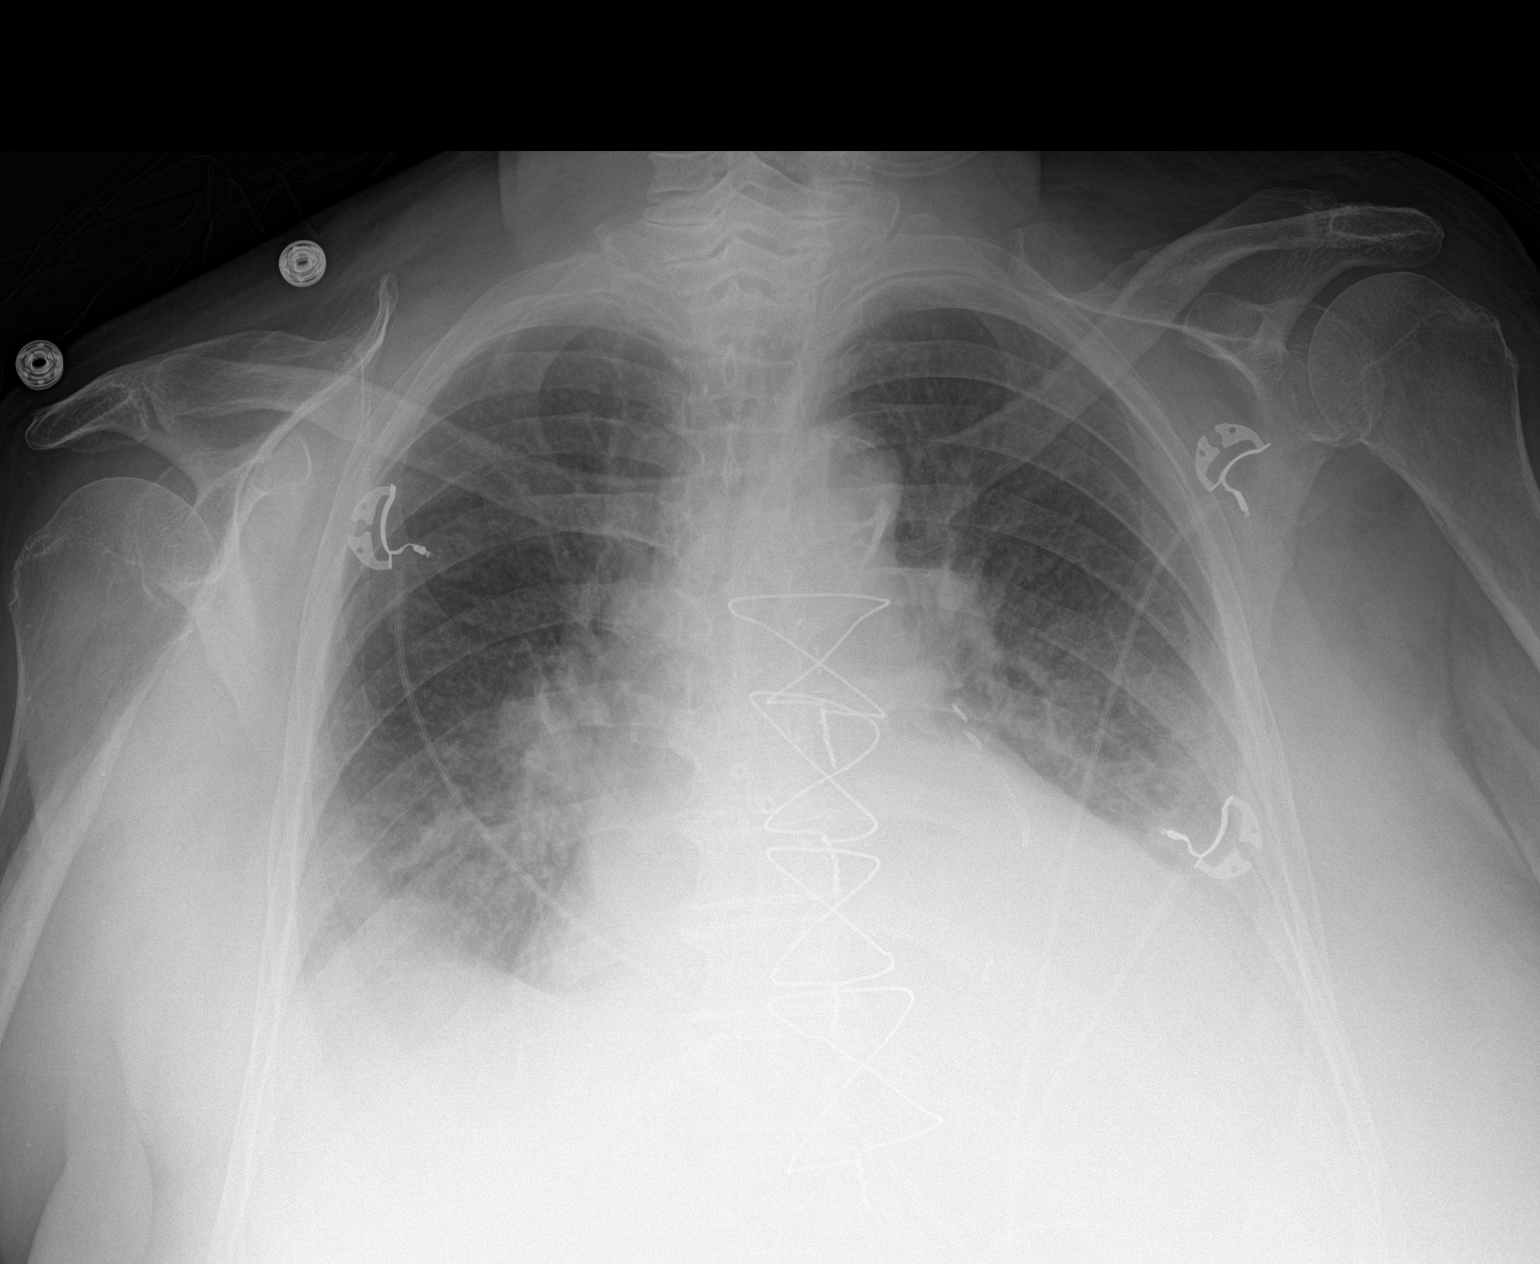

[1 of 1 positions shown; findings below may reference images not displayed]

FINDINGS: Prior CABG. Stable cardiomegaly. Atherosclerotic calcification of
the aortic arch. Increasing small bilateral pleural effusions with
new dense consolidation at the left lung base. Increasing perihilar
opacities. No pneumothorax. No acute osseous abnormality.
IMPRESSION: 1. Cardiomegaly with increasing small bilateral pleural effusions
and pulmonary edema.
2. New dense consolidation in the left lung base may reflect
atelectasis, however, pneumonia could have a similar appearance in
the proper clinical setting.
